# Patient Record
Sex: Male | Born: 1935
Health system: Southern US, Community
[De-identification: ages and names within clinical notes are randomized; demographics above are authoritative.]

## PROBLEM LIST (undated history)

## (undated) DIAGNOSIS — E11319 Type 2 diabetes mellitus with unspecified diabetic retinopathy without macular edema: Principal | ICD-10-CM

## (undated) DIAGNOSIS — I639 Cerebral infarction, unspecified: Secondary | ICD-10-CM

## (undated) DIAGNOSIS — H35039 Hypertensive retinopathy, unspecified eye: Secondary | ICD-10-CM

## (undated) DIAGNOSIS — N189 Chronic kidney disease, unspecified: Secondary | ICD-10-CM

## (undated) DIAGNOSIS — D239 Other benign neoplasm of skin, unspecified: Secondary | ICD-10-CM

## (undated) DIAGNOSIS — B029 Zoster without complications: Secondary | ICD-10-CM

## (undated) DIAGNOSIS — T8859XA Other complications of anesthesia, initial encounter: Secondary | ICD-10-CM

## (undated) DIAGNOSIS — C4491 Basal cell carcinoma of skin, unspecified: Secondary | ICD-10-CM

## (undated) DIAGNOSIS — D034 Melanoma in situ of scalp and neck: Secondary | ICD-10-CM

## (undated) DIAGNOSIS — I2510 Atherosclerotic heart disease of native coronary artery without angina pectoris: Secondary | ICD-10-CM

## (undated) DIAGNOSIS — B0229 Other postherpetic nervous system involvement: Secondary | ICD-10-CM

## (undated) DIAGNOSIS — I10 Essential (primary) hypertension: Secondary | ICD-10-CM

## (undated) DIAGNOSIS — I509 Heart failure, unspecified: Secondary | ICD-10-CM

## (undated) DIAGNOSIS — R1313 Dysphagia, pharyngeal phase: Secondary | ICD-10-CM

## (undated) DIAGNOSIS — M1990 Unspecified osteoarthritis, unspecified site: Secondary | ICD-10-CM

## (undated) DIAGNOSIS — D696 Thrombocytopenia, unspecified: Secondary | ICD-10-CM

## (undated) DIAGNOSIS — E785 Hyperlipidemia, unspecified: Secondary | ICD-10-CM

## (undated) DIAGNOSIS — T4145XA Adverse effect of unspecified anesthetic, initial encounter: Secondary | ICD-10-CM

## (undated) DIAGNOSIS — R55 Syncope and collapse: Secondary | ICD-10-CM

## (undated) DIAGNOSIS — H8110 Benign paroxysmal vertigo, unspecified ear: Secondary | ICD-10-CM

## (undated) DIAGNOSIS — Z85820 Personal history of malignant melanoma of skin: Secondary | ICD-10-CM

## (undated) DIAGNOSIS — N3000 Acute cystitis without hematuria: Secondary | ICD-10-CM

## (undated) DIAGNOSIS — C4492 Squamous cell carcinoma of skin, unspecified: Secondary | ICD-10-CM

## (undated) HISTORY — PX: FINGER SURGERY: SHX640

## (undated) HISTORY — PX: OTHER SURGICAL HISTORY: SHX169

## (undated) HISTORY — PX: CATARACT EXTRACTION: SUR2

## (undated) HISTORY — DX: Acute cystitis without hematuria: N30.00

## (undated) HISTORY — DX: Personal history of malignant melanoma of skin: Z85.820

## (undated) HISTORY — PX: FOOT SURGERY: SHX648

## (undated) HISTORY — DX: Hyperlipidemia, unspecified: E78.5

## (undated) HISTORY — PX: PENILE PROSTHESIS IMPLANT: SHX240

## (undated) HISTORY — DX: Atherosclerotic heart disease of native coronary artery without angina pectoris: I25.10

## (undated) HISTORY — DX: Essential (primary) hypertension: I10

## (undated) HISTORY — DX: Unspecified osteoarthritis, unspecified site: M19.90

## (undated) HISTORY — DX: Basal cell carcinoma of skin, unspecified: C44.91

## (undated) HISTORY — DX: Squamous cell carcinoma of skin, unspecified: C44.92

## (undated) HISTORY — DX: Type 2 diabetes mellitus with unspecified diabetic retinopathy without macular edema: E11.319

## (undated) HISTORY — DX: Other benign neoplasm of skin, unspecified: D23.9

## (undated) HISTORY — DX: Melanoma in situ of scalp and neck: D03.4

## (undated) HISTORY — DX: Cerebral infarction, unspecified: I63.9

## (undated) HISTORY — DX: Dysphagia, pharyngeal phase: R13.13

## (undated) HISTORY — DX: Chronic kidney disease, unspecified: N18.9

## (undated) HISTORY — DX: Other postherpetic nervous system involvement: B02.29

## (undated) HISTORY — DX: Hypertensive retinopathy, unspecified eye: H35.039

## (undated) HISTORY — PX: TONSILLECTOMY: SUR1361

---

## 1992-11-28 DIAGNOSIS — E11319 Type 2 diabetes mellitus with unspecified diabetic retinopathy without macular edema: Secondary | ICD-10-CM

## 1992-11-28 HISTORY — DX: Type 2 diabetes mellitus with unspecified diabetic retinopathy without macular edema: E11.319

## 1999-11-19 ENCOUNTER — Encounter: Payer: Self-pay | Admitting: Emergency Medicine

## 1999-11-19 ENCOUNTER — Inpatient Hospital Stay (HOSPITAL_COMMUNITY): Admission: EM | Admit: 1999-11-19 | Discharge: 1999-11-21 | Payer: Self-pay | Admitting: Emergency Medicine

## 1999-11-19 ENCOUNTER — Encounter: Payer: Self-pay | Admitting: Neurology

## 2001-02-07 ENCOUNTER — Ambulatory Visit (HOSPITAL_BASED_OUTPATIENT_CLINIC_OR_DEPARTMENT_OTHER): Admission: RE | Admit: 2001-02-07 | Discharge: 2001-02-07 | Payer: Self-pay | Admitting: Orthopedic Surgery

## 2001-02-07 ENCOUNTER — Encounter: Admission: RE | Admit: 2001-02-07 | Discharge: 2001-02-07 | Payer: Self-pay | Admitting: Orthopedic Surgery

## 2001-02-07 ENCOUNTER — Encounter: Payer: Self-pay | Admitting: Orthopedic Surgery

## 2003-08-02 ENCOUNTER — Emergency Department (HOSPITAL_COMMUNITY): Admission: AD | Admit: 2003-08-02 | Discharge: 2003-08-02 | Payer: Self-pay | Admitting: Emergency Medicine

## 2003-08-02 ENCOUNTER — Encounter: Payer: Self-pay | Admitting: Emergency Medicine

## 2004-12-03 ENCOUNTER — Inpatient Hospital Stay (HOSPITAL_COMMUNITY): Admission: RE | Admit: 2004-12-03 | Discharge: 2004-12-04 | Payer: Self-pay | Admitting: Urology

## 2010-04-27 HISTORY — PX: CARDIOVASCULAR STRESS TEST: SHX262

## 2010-04-28 HISTORY — PX: REPLACEMENT TOTAL KNEE: SUR1224

## 2010-05-24 ENCOUNTER — Inpatient Hospital Stay (HOSPITAL_COMMUNITY): Admission: RE | Admit: 2010-05-24 | Discharge: 2010-05-27 | Payer: Self-pay | Admitting: Orthopedic Surgery

## 2010-07-07 ENCOUNTER — Ambulatory Visit: Payer: Self-pay | Admitting: Cardiology

## 2010-09-29 ENCOUNTER — Ambulatory Visit (HOSPITAL_COMMUNITY): Admission: RE | Admit: 2010-09-29 | Discharge: 2010-09-29 | Payer: Self-pay | Admitting: Orthopedic Surgery

## 2010-09-29 ENCOUNTER — Ambulatory Visit: Payer: Self-pay | Admitting: Vascular Surgery

## 2010-10-18 ENCOUNTER — Ambulatory Visit (HOSPITAL_COMMUNITY)
Admission: RE | Admit: 2010-10-18 | Discharge: 2010-10-18 | Payer: Self-pay | Source: Home / Self Care | Admitting: Orthopedic Surgery

## 2010-11-17 ENCOUNTER — Ambulatory Visit: Payer: Self-pay | Admitting: Cardiology

## 2010-11-17 LAB — COMPREHENSIVE METABOLIC PANEL
CO2: 28 mmol/L
Creat: 1.72
Glucose: 89
Total Bilirubin: 0.6 mg/dL

## 2010-11-17 LAB — LIPID PANEL
Cholesterol, Total: 139
Direct LDL: 72
HDL: 37 mg/dL (ref 35–70)
Triglycerides: 152

## 2011-02-13 LAB — PROTIME-INR
INR: 0.98 (ref 0.00–1.49)
INR: 1.14 (ref 0.00–1.49)
INR: 1.99 — ABNORMAL HIGH (ref 0.00–1.49)
Prothrombin Time: 12.9 seconds (ref 11.6–15.2)
Prothrombin Time: 14.5 seconds (ref 11.6–15.2)
Prothrombin Time: 23.8 seconds — ABNORMAL HIGH (ref 11.6–15.2)

## 2011-02-13 LAB — GLUCOSE, CAPILLARY
Glucose-Capillary: 219 mg/dL — ABNORMAL HIGH (ref 70–99)
Glucose-Capillary: 237 mg/dL — ABNORMAL HIGH (ref 70–99)
Glucose-Capillary: 260 mg/dL — ABNORMAL HIGH (ref 70–99)
Glucose-Capillary: 277 mg/dL — ABNORMAL HIGH (ref 70–99)
Glucose-Capillary: 284 mg/dL — ABNORMAL HIGH (ref 70–99)
Glucose-Capillary: 298 mg/dL — ABNORMAL HIGH (ref 70–99)
Glucose-Capillary: 309 mg/dL — ABNORMAL HIGH (ref 70–99)
Glucose-Capillary: 342 mg/dL — ABNORMAL HIGH (ref 70–99)
Glucose-Capillary: 354 mg/dL — ABNORMAL HIGH (ref 70–99)

## 2011-02-13 LAB — BASIC METABOLIC PANEL
BUN: 14 mg/dL (ref 6–23)
BUN: 18 mg/dL (ref 6–23)
Calcium: 8.2 mg/dL — ABNORMAL LOW (ref 8.4–10.5)
Chloride: 101 mEq/L (ref 96–112)
Chloride: 101 mEq/L (ref 96–112)
Creatinine, Ser: 1.69 mg/dL — ABNORMAL HIGH (ref 0.4–1.5)
GFR calc Af Amer: 48 mL/min — ABNORMAL LOW (ref >60)
GFR calc non Af Amer: 38 mL/min — ABNORMAL LOW (ref >60)
Potassium: 4.6 mEq/L (ref 3.5–5.1)
Sodium: 137 mEq/L (ref 135–145)

## 2011-02-13 LAB — CBC
HCT: 33 % — ABNORMAL LOW (ref 39.0–52.0)
HCT: 34.9 % — ABNORMAL LOW (ref 39.0–52.0)
Hemoglobin: 11.4 g/dL — ABNORMAL LOW (ref 13.0–17.0)
Hemoglobin: 12.1 g/dL — ABNORMAL LOW (ref 13.0–17.0)
Hemoglobin: 14.8 g/dL (ref 13.0–17.0)
MCH: 32.5 pg (ref 26.0–34.0)
MCH: 32.5 pg (ref 26.0–34.0)
MCHC: 33.2 g/dL (ref 30.0–36.0)
MCHC: 34.6 g/dL (ref 30.0–36.0)
MCHC: 34.7 g/dL (ref 30.0–36.0)
MCV: 93.9 fL (ref 78.0–100.0)
MCV: 94.4 fL (ref 78.0–100.0)
MCV: 95.7 fL (ref 78.0–100.0)
Platelets: 126 10*3/uL — ABNORMAL LOW (ref 150–400)
RDW: 12.8 % (ref 11.5–15.5)
RDW: 12.9 % (ref 11.5–15.5)
RDW: 13 % (ref 11.5–15.5)
WBC: 9.1 10*3/uL (ref 4.0–10.5)

## 2011-02-13 LAB — COMPREHENSIVE METABOLIC PANEL
ALT: 16 U/L (ref 0–53)
BUN: 28 mg/dL — ABNORMAL HIGH (ref 6–23)
CO2: 26 mEq/L (ref 19–32)
Calcium: 9.3 mg/dL (ref 8.4–10.5)
Creatinine, Ser: 1.93 mg/dL — ABNORMAL HIGH (ref 0.4–1.5)
GFR calc non Af Amer: 34 mL/min — ABNORMAL LOW (ref >60)
Glucose, Bld: 509 mg/dL — ABNORMAL HIGH (ref 70–99)
Sodium: 133 mEq/L — ABNORMAL LOW (ref 135–145)
Total Protein: 6.9 g/dL (ref 6.0–8.3)

## 2011-02-13 LAB — SURGICAL PCR SCREEN: Staphylococcus aureus: NEGATIVE

## 2011-02-13 LAB — APTT: aPTT: 24 seconds (ref 24–37)

## 2011-02-13 LAB — URINALYSIS, ROUTINE W REFLEX MICROSCOPIC
Bilirubin Urine: NEGATIVE
Nitrite: NEGATIVE
Protein, ur: NEGATIVE mg/dL
Specific Gravity, Urine: 1.03 (ref 1.005–1.030)
Urobilinogen, UA: 0.2 mg/dL (ref 0.0–1.0)

## 2011-02-13 LAB — TYPE AND SCREEN
ABO/RH(D): B POS
Antibody Screen: NEGATIVE

## 2011-04-15 NOTE — Op Note (Signed)
[place]. [facility]  Patient:    CURRY, SEEFELDT                      MRN: [id number] Proc. Date: 02/07/01 Adm. Date:  [id number] Attending:  Lara Mulch                           Operative Report  PREOPERATIVE DIAGNOSIS:  Left navicular fracture/dislocation.  POSTOPERATIVE DIAGNOSIS:  Left navicular fracture/dislocation.  PROCEDURE:  Left navicular open reduction/internal fixation.  ANESTHESIA:  General.  SURGEON:  Estill Bamberg. Ronnie Derby, M.D.  ASSISTANT:  None.  INDICATION FOR PROCEDURE:  Patient is a 77 year old white male, who fell yesterday morning and x-ray showed a fracture/dislocation of the navicular. After informed consent was obtained, he was taken to the operating room.  DESCRIPTION OF PROCEDURE:  Patient was laid supine, given general endotracheal intubation and the left lower extremity was prepped and draped in the usual sterile fashion.  The extremity was exsanguinated and the tourniquet was elevated to 250 mmHg.  A straight incision was made approximately 4 cm in length with a #10-blade directly over the navicular.  Sharp dissection was continued down to the bone.  It had a split with a superior and inferior piece.  There was comminution on the lateral border.  I used the bone reduction forcep under direct C-arm imaging to obtain a reduction after cleaning out the fracture site.  I then placed a 4.0 cannulated screw from inferolateral to superomedially and after placing the 26 mm screw, the reduction clamp was removed and it was obtained and good alignment was obtained and it was completely located without any subluxation at all.  The wound was then irrigated and closed with deep 0 Vicryl interrupted sutures, subcuticular 3-0 Vicryls and Steri-Strips.  Adaptic, 4 x 4s, and a posterior splint was used for a dressing.  Tourniquet time 40 minutes.  Complications none.  Drains none. DD:  02/07/01 TD:  02/07/01 Job: [id number]

## 2011-04-15 NOTE — H&P (Signed)
Frank, Simpson               ACCOUNT NO.:  [id number]   MEDICAL RECORD NO.:  [id number]          PATIENT TYPE:  INP   LOCATION:  1308                         FACILITY:  [facility]   PHYSICIAN:  Duane Lope. Parks Neptune, M.D.DATE OF BIRTH:  1931/03/11   DATE OF ADMISSION:  12/03/2004  DATE OF DISCHARGE:  12/04/2004                                HISTORY & PHYSICAL   CHIEF COMPLAINT:  Erectile dysfunction.   HISTORY OF PRESENT ILLNESS:  Frank Simpson is a pleasant 77 year old male with a  past medical history significant for diabetes mellitus, as well as Perone's  disease with resulting impotence.  The patient also has some bladder  obstructive symptoms which are stable on Flomax.  The patient is also known  to have benign prostatic hypertrophy.  Regarding his Perone's disease and  organic impotence, he was recently evaluated in the clinic.  He has tried  vitamin E in the past for treatment of his Perone's disease without a good  result.  He is not interested in intra-lesional injections.  According to  the patient, his erectile dysfunction has prevented him from having an  erection adequate for penetration for some time.  He is therefore interested  in the surgical intervention for the treatment of his erectile dysfunction.  He is admitted at this time to undergo placement of a three-piece inflatable  penile prosthesis.   PAST MEDICAL HISTORY:  1.  Perone's disease.  2.  Organic impotence.  3.  Non-insulin dependent diabetes mellitus.  4.  Hypertension.  5.  Hypercholesterolemia.   PAST SURGICAL HISTORY:  1.  Surgery on the right foot.  2.  Repair of right digit.   FAMILY HISTORY:  Positive for diabetes mellitus and hypertension.  There is  no family history of genitourinary malignancy.   SOCIAL HISTORY:  The patient denies tobacco, alcohol, or intravenous drug  use.   REVIEW OF SYSTEMS:  Positive for diabetes mellitus, headaches, muscle aches,  and nocturia.  The remainder of the  review of systems have been evaluated  and are negative.   MEDICATIONS:  The patient takes medications for diabetes mellitus,  hypertension, as well as hypercholesterolemia.   ALLERGIES:  No known drug allergies.   PHYSICAL EXAMINATION:  VITAL SIGNS:  Temperature 98.2, heart rate 68, blood  pressure 142/60, weight 208 pounds.  GENERAL:  A well-developed, well-nourished, in no acute distress.  HEENT:  Normocephalic, atraumatic.  Pupils equal, round, reactive to light  and accommodation.  Extraocular movements were intact.  The oropharynx is  clear.  NECK:  Supple, no lymphadenopathy.  There is no JVD.  CARDIOVASCULAR:  Regular rate and rhythm, no murmurs, rubs, or gallops.  PULMONARY:  Clear to auscultation bilaterally.  ABDOMEN:  Soft, nontender, nondistended, positive bowel sounds.  GENITOURINARY:  Normal-appearing circumcised male phallus.  There are  palpable Perone's plaques especially in the distal most portion of the left  corpora.  The testicles are bilaterally descended, and there appears to be a  small hydrocele overlying the right testicle.  Both epididymis are palpable  and nontender.  RECTAL:  Digital rectal examination deferred.  EXTREMITIES:  No cyanosis, clubbing, or edema.  NEUROLOGIC:  Cranial nerves II-XII intact.  Motor is 5/5 throughout.  Sensation is grossly nonfocal.   LABORATORY DATA:  The patient has a creatinine of 1.7.   ADDITIONAL STUDIES:  The patient had a normal Cardiolite performed on  December 22, 2003.  There was no evidence of inducible ischemia.  He has an  ejection fraction of 60%.   IMPRESSION AND PLAN:  Frank Simpson is a 77 year old male with diabetes mellitus  (non-insulin dependent) who suffers from erectile dysfunction.  He has known  Perone's disease also with an element of organic impotence.  He is admitted  at this time to undergo placement of a three-piece inflatable penile  prosthesis.  He has been cleared by his cardiologist for  surgery.   PLAN:  1.  Consent for IPP.  2.  He will be taken to the operating room to undergo placement of an AMS700      penile prosthesis coated with _________________.      EG/MEDQ  D:  12/05/2004  T:  12/05/2004  Job:  [id number]

## 2011-04-15 NOTE — H&P (Signed)
[place]. [facility]  Patient:    Frank Simpson                       MRN: [id number] Adm. Date:  [id number] Attending:  Vickki Muff, Richard Mark Iv Dictator:   Izora Ribas. Irene Pap, M.D.                         History and Physical  CHIEF COMPLAINT:  Dizziness, incoordination.  HISTORY OF PRESENT ILLNESS:  The patient is a 77 year old man who woke up early  this morning around 7:30 in the morning with a severe dizziness/vertigo, associated with difficulty walking, as he refers, he was walking like drunk.  The symptoms  were associated with numbness on the right side of his face, right arm, and some in the right lower extremity.  The patients wife thinks he may of had some slurred  speech as well.  He also has had facial and headache pain on the right side, throbbing in character, which is resolving at this time.  There is no history of double vision, no nausea, no vomiting.  No specific weakness of upper or lower extremities.  No prior history of strokes or TIAs.  PAST MEDICAL HISTORY: 1. Hypertension. 2. Diabetes mellitus.  CURRENT MEDICATIONS: 1. One anti-hypertensive. 2. One hypoglycemia oral agents, names which he cannot recall at this time.  ALLERGIES:  No known drug allergies.  SOCIAL HISTORY:  Lives with his wife.  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  Denies smoking, no drinking.  FAMILY HISTORY:  His mother for a stroke and intracranial hemorrhage.  PRIMARY CARE PHYSICIAN:  Dr. Darlin Coco.  REVIEW OF SYSTEMS:  As per history of present illness.  Denies chest pain, no shortness of breath, no abdominal pain, no fever, no seizures, no loss of consciousness.  PHYSICAL EXAMINATION:  VITAL SIGNS:  Blood pressure 166/99, pulse 66, respirations 14, temperature 97.4, oxygen saturation 97%.  HEENT:  Head is normocephalic, atraumatic, no meningeal signs.  Eyes, ears, nose, and throat are normal.  NECK:  Supple, no  bruits.  LUNGS:  Clear.  HEART:  Bilateral heart sounds are regular rhythm with no murmurs.  ABDOMEN:  Soft, bowel sounds present, no hepatosplenomegaly, no tenderness.  EXTREMITIES:  Positive pulses bilaterally.  NEUROLOGIC:  Awake, alert, oriented.  Speech is slightly dysarthric.  His memory and language abilities are appropriate for age.  Pupils are equal and reactive.  Bilateral extraocular cephalic movement intact.  Nose intact.  Face symmetric.  Tongue in midline.  Palate elevated in midline.  Motor examination and strength  equal bilaterally, 5/5 throughout, plantars downgoing.  Coordination: Finger-to-nose and heel-to-shin are normal.  Gait was not evaluated at this time. Sensation was intact with touch and pinprick.  LABORATORY DATA:  Neuro imaging:  I personally reviewed the CT scan of the patients brain which shows no acute ischemic changes, no hemorrhage, no hydrocephalus.  IMPRESSION: 1. Vertebral basilar insufficiency. 2. Hypertension. 3. Diabetes mellitus.  Plan, recommendations, the diagnosis, condition, and further interventions were  discussed at length with the patient and his wife at the bedside.  We will admit the patient to the hospital for further workup and testing for cerebrovascular disease.  Initial management will consist of hemodynamics with supported with intravenous fluids of normal saline 100 cc an hour, oxygen 2 L, nd heparin ischemic stroke protocol.  We will further obtain neuro imaging for noninvasive evaluation of  the intra and extracranial vessels with an MRI/MRA. Also, carotid ultrasounds and transcranial Doppler will be obtained. Cardiology evaluation will be requested with a 2-D echocardiogram to evaluate for potential embolic sources as a cause of a stroke.  Will ask physical therapy to evaluate he patient for ambulation and gait safety.  Dr. Mare Ferrari, the primary care physician, as notified for his patients admission. DD:   11/19/99 TD:  11/19/99 Job: [id number]

## 2011-04-15 NOTE — Discharge Summary (Signed)
NAMEMARSHAL, ESKEW               ACCOUNT NO.:  [id number]   MEDICAL RECORD NO.:  [id number]          PATIENT TYPE:  INP   LOCATION:  8182                         FACILITY:  [facility]   PHYSICIAN:  Duane Lope. Rosana Hoes, M.D.  DATE OF BIRTH:  06-01-1931   DATE OF ADMISSION:  12/03/2004  DATE OF DISCHARGE:  12/04/2004                                 DISCHARGE SUMMARY   ADMISSION DIAGNOSES:  Erectile dysfunction and Peyronie's disease.   DISCHARGE DIAGNOSIS:  Status post insertion of inflatable penile prosthesis.   BRIEF HISTORY:  Mr. Warf is a pleasant 77 year old white male with a  history significant for diabetes as well as Peyronie's disease with  resulting impotence.  He also had some bladder obstructive symptoms that are  stable on Flomax.  He also has BPH.  He has previously tried vitamin E for  treatment of his Peyronie's disease without a good result, and he has been  unable to have an erection for penetration in quite some time.  After  discussion with Dr. Rosana Hoes about implantable penile devices, he has elected  to proceed with this surgery.   PAST MEDICAL HISTORY:  1.  Peyronie's disease.  2.  Organic impotence.  3.  Noninsulin-dependent diabetes mellitus.  4.  Hypertension.  5.  Hypercholesterolemia.   PAST SURGICAL HISTORY:  1.  Surgery on the right foot.  2.  Repair of right digit.   ALLERGIES:  None known.   HOSPITAL COURSE:  The patient was admitted on December 03, 2004.  Mr. Dunne  was electively admitted to [facility] under the care of Dr. Tresa Endo, and he underwent the following procedure:  Insertion of inflatable  penile prosthesis.  He tolerated the procedure well, transferred in stable  condition to the PACU.  Mr. Zweber remained hospitalized overnight with  minimal swelling, some bruising noted, and voiding well.  He was discharged  on December 04, 2004.   CONDITION ON DISCHARGE:  Stable.   DISCHARGE MEDICATIONS:  1.  Cipro 500 mg p.o. b.i.d.  2.   Vicodin 1-2 q.4-6h.  3.  Glipizide ER 100 mg 1 daily.  4.  Flomax 0.4 mg 1 daily.  5.  Norvasc 5 mg 1 daily.  6.  Metformin 500 mg b.i.d.  7.  Triamterene and hydrochlorothiazide 37.5/25 mg 1 daily.  8.  Lipitor 10 mg 1 daily.   ACTIVITY:  He may gradually increase and may shower on Sunday.   No diet restrictions.   WOUND CARE:  Keep clean.   SPECIAL INSTRUCTIONS:  Call if temperature greater than 101 F or as needed.  Follow up following Thursday or Friday to see Alvin Critchley, NP-C.      JML/MEDQ  D:  12/23/2004  T:  12/23/2004  Job:  [id number]

## 2011-04-15 NOTE — Op Note (Signed)
Frank Simpson, Frank Simpson               ACCOUNT NO.:  [id number]   MEDICAL RECORD NO.:  [id number]          PATIENT TYPE:  INP   LOCATION:  1962                         FACILITY:  [facility]   PHYSICIAN:  Duane Lope. Rosana Hoes, M.D.  DATE OF BIRTH:  02-Apr-1931   DATE OF PROCEDURE:  12/03/2004  DATE OF DISCHARGE:  12/04/2004                                 OPERATIVE REPORT   PREOPERATIVE DIAGNOSIS:  Organic impotence.   POSTOPERATIVE DIAGNOSIS:  Organic impotence.   PROCEDURE PERFORMED:  Insertion of three-piece inflatable penile prosthesis  (AMS700).   ATTENDING SURGEON:  Duane Lope. Rosana Hoes, M.D.   RESIDENT SURGEON:  Lynford Citizen, M.D.   ANESTHESIA:  General endotracheal.   ESTIMATED BLOOD LOSS:  50 cc.   DRAINS:  A 16-French Foley catheter to straight drain.   COMPLICATIONS:  None.   INDICATION FOR PROCEDURE:  Frank Simpson is a pleasant 77 year old male with a  past medical history significant for diabetes mellitus.  He also has  Peyronie's disease with resulting impotence.  In regards to his impotence,  he has tried vitamin E in the past which has not helped.  He has not been  interested in intralesional injection of his Peyronie's plaque.  Several  alternatives were discussed to manage his erectile dysfunction.  Frank Simpson  is willing to proceed with the insertion of a three-piece inflatable penile  prosthesis.  In addition, he may well need plaque excision at the time of  surgery.  He understands all of the risks, benefits, and alternatives of the  procedure and is willing to proceed.   PROCEDURE IN DETAIL:  Following identification by his arm bracelet, the  patient was brought to the operating room and placed in the supine position.  Here he received preoperative IV antibiotics and underwent successful  induction of general endotracheal anesthesia.  His lower abdomen, perineum,  and genitalia were then given a 10-minute iodine scrub.  This was followed  by prep with Betadine paint and the  phallus was draped in the usual sterile  fashion.  An approximate 5-cm vertical incision was then created from the  base of the penis towards the umbilicus.  The incision was carried through  the subcutaneous tissue using Bovie electrocautery.  The rectus fascia was  encountered and entered using the Bovie.  The surgeon's finger was then used  to create a pocket beneath the rectus muscle adequate for placement of the  reservoir.  Of mention, prior to incision, a 16-French Foley catheter was  placed and the bladder was drained in its entirety.  We then used Metzenbaum  scissors to remove the overlying tissue from the corpora at the base of the  penis.  Care was taken to avoid the urethra which was easily palpable with a  Foley catheter in place.  Near the base of both corpora, we then placed  interrupted 3-0 PDS holding sutures, both on the right and left corpora.  We  then used 15-blade scalpel to incise the corpora in between the holding  sutures.  Metzenbaum scissors were then used to further develop the  corporotomies.  Corporotomies were extended distally and proximally to allow  adequate dilation and placement of the cylinders.  We then dilated both  corpora both distally and proximally using the [facility] dilators.  We dilated  up to 14-French without difficulty.  There was palpable plaque in the left  corpora which broke fairly easily with dilation using the [facility] dilators.  Once dilation was adequate, we then placed a 13-French Hagar dilator which  passed easily into the tip of the glans distally on both the right and the  left.  In addition proximally the Hagar dilator was found to pass to the  base of both corpora until bone was encountered.  We then irrigated the  incision with antibiotic solution.  Two sterile green towels were placed on  the already sterile field.  The inflatable device was then prepared for  insertion.  Using the [facility] tool, the cylinder holding sutures were  passed  through the lateral aspect of the glans on both the right and the left using  the [place] needle.  Prior to this, we performed measurement of both corpora  which measured approximately 18 on both the right and the left.  We  therefore decided to place a 15-cm prosthetic device with 3-cm rear tips to  achieve 18 cm bilaterally.  The corporal cylinders were then advanced  distally on both the right and the left.  With 3-cm rear tips, the  prosthetic cylinders fit well without any buckling or excess cylinder from  the corporotomy incisions.  Holding gentle pressure at the corporotomy  sites, we then cycled the device to ensure an adequate result.  The result  was excellent with a straight erection.  We then performed some molding of  the device to provide a straight penis with an excellent result.  The distal  aspects of both corporal cylinders were in their proper locations.  We then  closed both corporotomies over the device carefully using a running 3-0 PDS.  In addition, the previously described traction sutures were used to  reinforce the closing of the corporotomies.  We then everted the right  hemiscrotum into the incision, care taken to keep the testicle from this  portion of the procedure.  Using a dry Ray-Tec, the overlying adherent  tissue was moved to provide a nice pocket for placement of the pump in the  most dependent portion of the right hemiscrotum.  The pump was then placed  into this pocket with an excellent result.  It was quite easy to access and  was quite dependent in the right hemiscrotum.  We then placed the reservoir  within the previously formed space beneath the right rectus muscle adjacent  to the bladder.  Approximately 65 cc were then placed into the reservoir and  there was no back pressure on the reservoir.  Five cc's were removed from  the reservoir and 60 cc left as the tubing was clamped with a rubber shot. We then trimmed all excess tubing and  connected the pump to the reservoir  using a straight connector.  Great care was taken to ensure no air bubbles  were in the tubing.  The device was then again cycled and found to function  properly with both a straight erection and rigidity sufficient for  intercourse.  The incision with all tubing and components was then again  copiously irrigated with antibiotic solution.  The tubing was then closed  within the deep subcutaneous tissue using a running 3-0 chromic suture.  Prior  to this, the rectus fascia was closed around the reservoir tubing with  a running 3-0 PDS suture.  The skin was then closed with surgical clips.  The Foley catheter was connected to straight drainage.  Prior to awakening  from anesthesia, an athletic supporter with Kerlix fluff was applied to  complete the dressing.  A sterile dressing was applied over the staple line.  The patient tolerated the procedure well and there were no complications.  Please note that Dr. Tresa Endo was present and participated in the entire  procedure as he was the responsible surgeon.   DISPOSITION:  After awakening from general anesthesia, the patient was  transferred to the postanesthesia care unit in stable condition.  From here,  he would be transferred to the floor for overnight observation and removal  of his catheter the following morning.      EG/MEDQ  D:  12/05/2004  T:  12/06/2004  Job:  [id number]

## 2011-05-02 ENCOUNTER — Other Ambulatory Visit: Payer: Self-pay | Admitting: *Deleted

## 2011-05-02 MED ORDER — ROSUVASTATIN CALCIUM 10 MG PO TABS: 10.0000 mg | ORAL_TABLET | Freq: Every day | ORAL | Status: DC

## 2011-05-02 NOTE — Telephone Encounter (Signed)
escribe medication per fax request

## 2011-05-30 ENCOUNTER — Other Ambulatory Visit: Payer: Self-pay | Admitting: *Deleted

## 2011-05-30 DIAGNOSIS — I119 Hypertensive heart disease without heart failure: Secondary | ICD-10-CM

## 2011-05-30 MED ORDER — TRIAMTERENE-HCTZ 37.5-25 MG PO TABS: 1.0000 | ORAL_TABLET | Freq: Every day | ORAL | Status: DC

## 2011-05-30 MED ORDER — AMLODIPINE BESYLATE 5 MG PO TABS: 5.0000 mg | ORAL_TABLET | Freq: Every day | ORAL | Status: DC

## 2011-05-30 NOTE — Telephone Encounter (Signed)
Refilled meds per fax request.

## 2011-07-06 ENCOUNTER — Encounter: Payer: Self-pay | Admitting: Cardiology

## 2011-07-15 ENCOUNTER — Other Ambulatory Visit: Payer: Self-pay | Admitting: Cardiology

## 2011-07-15 DIAGNOSIS — E785 Hyperlipidemia, unspecified: Secondary | ICD-10-CM

## 2011-07-15 DIAGNOSIS — I10 Essential (primary) hypertension: Secondary | ICD-10-CM

## 2011-07-18 ENCOUNTER — Encounter: Payer: Self-pay | Admitting: Cardiology

## 2011-07-18 ENCOUNTER — Other Ambulatory Visit (INDEPENDENT_AMBULATORY_CARE_PROVIDER_SITE_OTHER): Payer: Medicare Other | Admitting: *Deleted

## 2011-07-18 ENCOUNTER — Ambulatory Visit (INDEPENDENT_AMBULATORY_CARE_PROVIDER_SITE_OTHER): Payer: Medicare Other | Admitting: Cardiology

## 2011-07-18 DIAGNOSIS — M1990 Unspecified osteoarthritis, unspecified site: Secondary | ICD-10-CM

## 2011-07-18 DIAGNOSIS — E1140 Type 2 diabetes mellitus with diabetic neuropathy, unspecified: Secondary | ICD-10-CM | POA: Insufficient documentation

## 2011-07-18 DIAGNOSIS — E119 Type 2 diabetes mellitus without complications: Secondary | ICD-10-CM

## 2011-07-18 DIAGNOSIS — E7800 Pure hypercholesterolemia, unspecified: Secondary | ICD-10-CM

## 2011-07-18 DIAGNOSIS — I10 Essential (primary) hypertension: Secondary | ICD-10-CM

## 2011-07-18 DIAGNOSIS — E785 Hyperlipidemia, unspecified: Secondary | ICD-10-CM

## 2011-07-18 DIAGNOSIS — I119 Hypertensive heart disease without heart failure: Secondary | ICD-10-CM

## 2011-07-18 LAB — HEMOGLOBIN A1C: Hgb A1c MFr Bld: 8.8 % — ABNORMAL HIGH (ref 4.6–6.5)

## 2011-07-18 LAB — HEPATIC FUNCTION PANEL
Albumin: 4 g/dL (ref 3.5–5.2)
Alkaline Phosphatase: 43 U/L (ref 39–117)
Total Bilirubin: 1 mg/dL (ref 0.3–1.2)

## 2011-07-18 LAB — CBC WITH DIFFERENTIAL/PLATELET
Eosinophils Relative: 1 % (ref 0.0–5.0)
HCT: 50.1 % (ref 39.0–52.0)
Hemoglobin: 16.8 g/dL (ref 13.0–17.0)
Lymphs Abs: 3 10*3/uL (ref 0.7–4.0)
MCV: 94 fl (ref 78.0–100.0)
Monocytes Relative: 8 % (ref 3.0–12.0)
Neutro Abs: 3.9 10*3/uL (ref 1.4–7.7)
RDW: 13.3 % (ref 11.5–14.6)
WBC: 7.6 10*3/uL (ref 4.5–10.5)

## 2011-07-18 LAB — BASIC METABOLIC PANEL
Calcium: 9.3 mg/dL (ref 8.4–10.5)
GFR: 33.68 mL/min — ABNORMAL LOW (ref >60.00)
Glucose, Bld: 184 mg/dL — ABNORMAL HIGH (ref 70–99)
Sodium: 140 mEq/L (ref 135–145)

## 2011-07-18 LAB — LIPID PANEL
HDL: 40.1 mg/dL (ref >39.00)
LDL Cholesterol: 80 mg/dL (ref 0–99)
Total CHOL/HDL Ratio: 4
VLDL: 39.4 mg/dL (ref 0.0–40.0)

## 2011-07-18 NOTE — Progress Notes (Signed)
Ashby Jackquline Berlin Date of Birth:  1931/02/18 [facility] [address]   [place], [place]  [address] [phone]           Fax   [phone]  History of Present Illness: This pleasant 77 year old gentleman is seen for a scheduled followup office visit.  He has a past history of hypercholesterolemia, essential hypertension, and diabetes mellitus.  He also has a history of osteoarthritis.  He has had previous right total knee replacement but has continued to have some discomfort in the right knee and now is having some pain in his right foot.  Because of this he has not been able to do much walking and he has gained weight.  He denies any chest pain or shortness of breath he does not have any history of ischemic heart disease and he had a normal nuclear stress test 04/27/10.  Current Outpatient Prescriptions  Medication Sig Dispense Refill  . amLODipine (NORVASC) 5 MG tablet Take 1 tablet (5 mg total) by mouth daily.  90 tablet  3  . aspirin 325 MG tablet Take 325 mg by mouth daily. OCCASSIONALLY       . ezetimibe (ZETIA) 10 MG tablet Take 10 mg by mouth daily.        [name] Kitchen glipiZIDE (GLUCOTROL) 10 MG tablet Take 10 mg by mouth daily.        . insulin glargine (LANTUS) 100 UNIT/ML injection Inject 46 Units into the skin at bedtime.        . niacin (NIASPAN) 500 MG CR tablet Take 500 mg by mouth at bedtime. OCCASSIONALLY       . rosuvastatin (CRESTOR) 10 MG tablet Take 1 tablet (10 mg total) by mouth at bedtime.  90 tablet  3  . Solifenacin Succinate (VESICARE PO) Take by mouth daily.        . Tamsulosin HCl (FLOMAX) 0.4 MG CAPS Take 0.4 mg by mouth daily.        [name] Kitchen triamterene-hydrochlorothiazide (MAXZIDE-25) 37.5-25 MG per tablet Take 1 tablet by mouth daily.  90 tablet  3    Allergies  Allergen Reactions  . Januvia (Sitagliptin Phosphate)   . Metformin And Related     There is no problem list on file for this patient.   History  Smoking status  .  Never Smoker   Smokeless tobacco  . Not on file    History  Alcohol Use No    Family History  Problem Relation Age of Onset  . Stroke Mother   . Cancer Father     Review of Systems: Constitutional: no fever chills diaphoresis or fatigue or change in weight.  Head and neck: no hearing loss, no epistaxis, no photophobia or visual disturbance. Respiratory: No cough, shortness of breath or wheezing. Cardiovascular: No chest pain peripheral edema, palpitations. Gastrointestinal: No abdominal distention, no abdominal pain, no change in bowel habits hematochezia or melena. Genitourinary: No dysuria, no frequency, no urgency, no nocturia. Musculoskeletal:No arthralgias, no back pain, no gait disturbance or myalgias. Neurological: No dizziness, no headaches, no numbness, no seizures, no syncope, no weakness, no tremors. Hematologic: No lymphadenopathy, no easy bruising. Psychiatric: No confusion, no hallucinations, no sleep disturbance.    Physical Exam: Filed Vitals:   07/18/11 0937  BP: 130/80  Pulse: 76  The general appearance reveals a well-developed well-nourished gentleman in no distress.The head and neck exam reveals pupils equal and reactive.  Extraocular movements are full.  There is no scleral icterus.  The mouth and pharynx are normal.  The neck is supple.  The carotids reveal no bruits.  The jugular venous pressure is normal.  The  thyroid is not enlarged.  There is no lymphadenopathy.  The chest is clear to percussion and auscultation.  There are no rales or rhonchi.  Expansion of the chest is symmetrical.  The precordium is quiet.  The first heart sound is normal.  The second heart sound is physiologically split.  There is no murmur gallop rub or click.  There is no abnormal lift or heave.  The abdomen is soft and nontender.  The bowel sounds are normal.  The liver and spleen are not enlarged.  There are no abdominal masses.  There are no abdominal bruits.  Extremities reveal  good pedal pulses.  There is no phlebitis or edema.  There is no cyanosis or clubbing.  Strength is normal and symmetrical in all extremities.  There is no lateralizing weakness.  There are no sensory deficits.  The skin is warm and dry.  There is no rash.     Assessment / Plan: The patient is to increase his Lantus insulin to 50 units each night.  Continue other medicines the same.  Recheck in 6 months for followup office visit and lab work.  I encouraged him to go back to his orthopedist to discuss his right foot pain and see if anything could be done about it.

## 2011-07-18 NOTE — Assessment & Plan Note (Signed)
The patient has a past history of hypercholesterolemia.  He is on Zetia Niaspan and Crestor.  He's not having any myalgias from the statin therapy.  He will continue present regimen.

## 2011-07-18 NOTE — Assessment & Plan Note (Signed)
The patient is not having any headaches or dizziness.  No shortness of breath with minimal activity.  His weight is up 13 pounds since last visit which she attributes to inactivity because of foot pain.

## 2011-07-18 NOTE — Assessment & Plan Note (Addendum)
The patient is on insulin and glipizide.  He is not having any hypoglycemic reactions.  We're checking lab work on him today.His hemoglobin A1c today is high at 8.8 And we will have him increase his Lantus insulin to 50 units each night instead of 46 minutes.  He also needs to try to increase exercise and to cut back on calories

## 2011-07-19 ENCOUNTER — Other Ambulatory Visit: Payer: Self-pay | Admitting: *Deleted

## 2011-07-19 DIAGNOSIS — E119 Type 2 diabetes mellitus without complications: Secondary | ICD-10-CM

## 2011-07-19 MED ORDER — GLIPIZIDE 10 MG PO TABS: 10.0000 mg | ORAL_TABLET | Freq: Every day | ORAL | Status: DC

## 2011-07-19 NOTE — Telephone Encounter (Signed)
Refilled meds per fax request.

## 2011-07-20 ENCOUNTER — Telehealth: Payer: Self-pay | Admitting: *Deleted

## 2011-07-20 NOTE — Telephone Encounter (Signed)
Advised patient of labs and to increase Lantus to 50 units

## 2011-07-20 NOTE — Telephone Encounter (Signed)
Message copied by Deniece Ree on Wed Jul 20, 2011 10:10 AM ------      Message from: Darlin Coco      Created: Mon Jul 18, 2011  5:04 PM       Please report.  His hemoglobin A1c is too high at 8.8.  I wanted him to increase his Lantus insulin from 46 units up to 50 units each night.  His blood sugar is 184 and he is renal function is not as good.  His BUN is 30 and his creatinine is 2.0.  He needs to be sure to drink more water to prevent his kidneys from getting dry.  The liver tests are good.

## 2011-08-04 ENCOUNTER — Telehealth: Payer: Self-pay | Admitting: Cardiology

## 2011-08-04 NOTE — Telephone Encounter (Addendum)
Pt's wife calling for refill of humalog 43m, uses [facility] [address], pt's wife called back and does not need the med after all

## 2011-08-04 NOTE — Telephone Encounter (Signed)
Noted

## 2011-08-05 ENCOUNTER — Telehealth: Payer: Self-pay | Admitting: Cardiology

## 2011-08-05 NOTE — Telephone Encounter (Signed)
Wife called very concerned about blood sugar.  Yesterday blood sugar over 400 and today over 600.  Patient is going to call [place] [facility] to see if someone can see him there, if not advised to take him to urgent care.  Advised patient needs to be evaluated today.  Verbalized understanding and will get him evaluated today per wife

## 2011-08-06 NOTE — Telephone Encounter (Signed)
Agree with plan

## 2011-08-10 ENCOUNTER — Encounter: Payer: Self-pay | Admitting: Family Medicine

## 2011-08-11 ENCOUNTER — Encounter: Payer: Self-pay | Admitting: Family Medicine

## 2011-08-11 ENCOUNTER — Ambulatory Visit (INDEPENDENT_AMBULATORY_CARE_PROVIDER_SITE_OTHER): Payer: Medicare Other | Admitting: Family Medicine

## 2011-08-11 DIAGNOSIS — E1122 Type 2 diabetes mellitus with diabetic chronic kidney disease: Secondary | ICD-10-CM | POA: Insufficient documentation

## 2011-08-11 DIAGNOSIS — R32 Unspecified urinary incontinence: Secondary | ICD-10-CM

## 2011-08-11 DIAGNOSIS — E119 Type 2 diabetes mellitus without complications: Secondary | ICD-10-CM

## 2011-08-11 DIAGNOSIS — N189 Chronic kidney disease, unspecified: Secondary | ICD-10-CM

## 2011-08-11 DIAGNOSIS — N3941 Urge incontinence: Secondary | ICD-10-CM | POA: Insufficient documentation

## 2011-08-11 DIAGNOSIS — M1990 Unspecified osteoarthritis, unspecified site: Secondary | ICD-10-CM

## 2011-08-11 DIAGNOSIS — I10 Essential (primary) hypertension: Secondary | ICD-10-CM

## 2011-08-11 DIAGNOSIS — E7800 Pure hypercholesterolemia, unspecified: Secondary | ICD-10-CM

## 2011-08-11 MED ORDER — "INSULIN SYRINGE 29G X 1/2"" 0.5 ML MISC": Status: DC

## 2011-08-11 MED ORDER — INSULIN LISPRO 100 UNIT/ML ~~LOC~~ SOLN: SUBCUTANEOUS | Status: DC

## 2011-08-11 NOTE — Assessment & Plan Note (Signed)
Continue meds.  Recheck when returns fasting for blood work. Off zetia and niaspan.  Continues crestor.

## 2011-08-11 NOTE — Assessment & Plan Note (Signed)
Lab Results  Component Value Date   CREATININE 2.0* 07/18/2011  baseline Cr possibly 1.7. Slightly elevated last check. Discussed avoidance of NSAIDs.

## 2011-08-11 NOTE — Assessment & Plan Note (Addendum)
Chronic uncontrolled.  On lantus and glipizide. Not optimal control as evidenced by recent cbgs (brings log) Will slowly titrate lantus up to 60units.  Return 1 mo for f/u. Not candidate for metformin or januvia. Foot exam today, recommended make eye doctor appoint.

## 2011-08-11 NOTE — Assessment & Plan Note (Signed)
Recommended stop NSAID, transfer to tylenol.

## 2011-08-11 NOTE — Patient Instructions (Addendum)
Kidneys not looking so great, I would avoid anti inflammatories like ibuprofen, alleve, advil, motrin. Start aspirin 48m daily. May use tylenol 50110mthree times daily, may increase to 100049mhree times daily if needed (but this would be max dose). Return at your convenience fasting for blood work. Make appointment for vision screen. urine checked today. If average of 3 morning sugars consistently >160, increase lantus by 2 units to goal of 60units. Return in 1 month for follow up of diabetes.

## 2011-08-11 NOTE — Assessment & Plan Note (Signed)
Stable, chronic.  continue current meds. May consider change to ACEI when returns for next visit.

## 2011-08-11 NOTE — Progress Notes (Signed)
Subjective:    Patient ID: Frank Simpson, male    DOB: Mar 25, 1931, 77 y.o.   MRN: [id number]  HPI CC: new pt, establish.  Medicare  Mr. Aloisi presents today to establish.  Hasn't had PCP in past, seen by Dr. Mare Ferrari.  H/o urinary incontinence - previously saw urologist, on flomax nightly as well as vesicare prn.  Has undergone PTNS.  DM - 1994 dx.  Uncontrolled.  On lantus and ssi humalog as well as glipizide.  Sugars have been running high in past week. Brings log of sugars showing fasting in last 4 days 167-318, mealtime 217-242.  Last vision check 1 year ago.  No recent foot exam.  Intolerant of metformin 2/2 CRI and januvia (per chart allergy but pt unsure). Lab Results  Component Value Date   HGBA1C 8.8* 07/18/2011   HTN - well controlled on current meds.  Only on norvasc, maxzide and flomax.    CRI - unsure baseline cr.  Does use NSAIDs for arhtritis.  Doesn't think tylenol works as well.  Fall last week, bruised shoulder. H/o skin cancer (melanoma) in past x2.  First in stomach then in eye.  Also h/o AKs.  Preventative: Unsure last tetanus No physical recently.  Did have blood work done recently. No family history of prostate or colon cancer.  Medications and allergies reviewed and updated in chart.  Past histories reviewed and updated if relevant as below. Patient Active Problem List  Diagnoses  . Diabetes mellitus  . Hypercholesterolemia  . Benign hypertensive heart disease without heart failure   Past Medical History  Diagnosis Date  . Hypertension   . Diabetes mellitus 1994  . Hyperlipidemia   . Osteoarthritis   . CRI (chronic renal insufficiency)     baseline Cr seems o be 1.7  . Urinary incontinence     s/p PTNS didn't help  . Glaucoma     and cataracts  . History of melanoma    Past Surgical History  Procedure Date  . Replacement total knee 04/2010    RIGHT KNEE  . Tonsillectomy   . Cardiovascular stress test 04/27/2010    EF 75%, nuclear stress  test with normal perfusion, no ischemia  . Foot surgery     metal pin in place  . Shoulder surgery   . Finger surgery     amputated finger   History  Substance Use Topics  . Smoking status: Never Smoker   . Smokeless tobacco: Never Used  . Alcohol Use: No   Family History  Problem Relation Age of Onset  . Stroke Mother     hemorrhage  . Diabetes Mother   . Cancer Father     lung  . Diabetes Brother    Allergies  Allergen Reactions  . Januvia (Sitagliptin Phosphate)     Possibly affected kidneys?  . Metformin And Related     CRI   Current Outpatient Prescriptions on File Prior to Visit  Medication Sig Dispense Refill  . amLODipine (NORVASC) 5 MG tablet Take 1 tablet (5 mg total) by mouth daily.  90 tablet  3  . aspirin 325 MG tablet Take 325 mg by mouth daily. OCCASSIONALLY       . glipiZIDE (GLUCOTROL) 10 MG tablet Take 1 tablet (10 mg total) by mouth daily.  90 tablet  3  . insulin glargine (LANTUS) 100 UNIT/ML injection Inject 50 Units into the skin every morning.       . rosuvastatin (CRESTOR) 10 MG tablet Take  1 tablet (10 mg total) by mouth at bedtime.  90 tablet  3  . Solifenacin Succinate (VESICARE PO) Take 10 mg by mouth daily as needed.       . Tamsulosin HCl (FLOMAX) 0.4 MG CAPS Take 0.4 mg by mouth daily.        [name] Kitchen triamterene-hydrochlorothiazide (MAXZIDE-25) 37.5-25 MG per tablet Take 1 tablet by mouth daily.  90 tablet  3   Review of Systems  Constitutional: Negative for fever, chills, activity change, appetite change, fatigue and unexpected weight change.  HENT: Negative for hearing loss and neck pain.   Eyes: Negative for visual disturbance.  Respiratory: Negative for cough, chest tightness, shortness of breath and wheezing.   Cardiovascular: Negative for chest pain, palpitations and leg swelling.  Gastrointestinal: Negative for nausea, vomiting, abdominal pain, diarrhea, constipation, blood in stool and abdominal distention.  Genitourinary: Negative for  hematuria and difficulty urinating.  Musculoskeletal: Negative for myalgias and arthralgias.  Skin: Negative for rash.  Neurological: Positive for headaches. Negative for dizziness, seizures and syncope.  Hematological: Does not bruise/bleed easily.  Psychiatric/Behavioral: Negative for dysphoric mood. The patient is not nervous/anxious.        Objective:   Physical Exam  Nursing note and vitals reviewed. Constitutional: He is oriented to person, place, and time. He appears well-developed and well-nourished. No distress.  HENT:  Head: Normocephalic and atraumatic.  Right Ear: External ear normal.  Left Ear: External ear normal.  Nose: Nose normal.  Mouth/Throat: Oropharynx is clear and moist. No oropharyngeal exudate.  Eyes: Conjunctivae and EOM are normal. Pupils are equal, round, and reactive to light. No scleral icterus.  Neck: Normal range of motion. Neck supple. Carotid bruit is not present. No thyromegaly present.  Cardiovascular: Normal rate, regular rhythm, normal heart sounds and intact distal pulses.   No murmur heard. Pulses:      Radial pulses are 2+ on the right side, and 2+ on the left side.  Pulmonary/Chest: Effort normal and breath sounds normal. No respiratory distress. He has no wheezes. He has no rales.  Abdominal: Soft. Bowel sounds are normal. He exhibits no distension and no mass. There is no tenderness. There is no rebound and no guarding.  Musculoskeletal: Normal range of motion. He exhibits no edema.       Diabetic foot exam: Normal inspection No skin breakdown No calluses  Slightly diminished DP/PT pulses Some decreased sensation to monofilament R>L  Nails normal  Lymphadenopathy:    He has no cervical adenopathy.  Neurological: He is alert and oriented to person, place, and time.       CN grossly intact, station and gait intact  Skin: Skin is warm and dry. No rash noted.  Psychiatric: He has a normal mood and affect. His behavior is normal. Judgment  and thought content normal.          Assessment & Plan:

## 2011-08-12 LAB — MICROALBUMIN / CREATININE URINE RATIO: Microalb, Ur: 4.2 mg/dL — ABNORMAL HIGH (ref 0.0–1.9)

## 2011-09-16 ENCOUNTER — Ambulatory Visit (INDEPENDENT_AMBULATORY_CARE_PROVIDER_SITE_OTHER): Payer: Medicare Other | Admitting: Family Medicine

## 2011-09-16 ENCOUNTER — Other Ambulatory Visit: Payer: Self-pay | Admitting: *Deleted

## 2011-09-16 ENCOUNTER — Encounter: Payer: Self-pay | Admitting: Family Medicine

## 2011-09-16 DIAGNOSIS — R739 Hyperglycemia, unspecified: Secondary | ICD-10-CM

## 2011-09-16 DIAGNOSIS — R7309 Other abnormal glucose: Secondary | ICD-10-CM

## 2011-09-16 DIAGNOSIS — E119 Type 2 diabetes mellitus without complications: Secondary | ICD-10-CM

## 2011-09-16 DIAGNOSIS — N189 Chronic kidney disease, unspecified: Secondary | ICD-10-CM

## 2011-09-16 MED ORDER — INSULIN LISPRO 100 UNIT/ML ~~LOC~~ SOLN: SUBCUTANEOUS | Status: DC

## 2011-09-16 MED ORDER — TAMSULOSIN HCL 0.4 MG PO CAPS: 0.4000 mg | ORAL_CAPSULE | Freq: Every day | ORAL | Status: DC

## 2011-09-16 MED ORDER — INSULIN GLARGINE 100 UNIT/ML ~~LOC~~ SOLN: 60.0000 [IU] | SUBCUTANEOUS | Status: DC

## 2011-09-16 NOTE — Progress Notes (Signed)
  Subjective:    Patient ID: Frank Simpson, male    DOB: 04-12-1931, 77 y.o.   MRN: [id number]  HPI CC: f/u DM  1 mo f/u DM.  Previously followed by Dr. Mare Ferrari.  Has been stressed recently, wonders if elevated sugars due to this.  DM - 1994 dx. Uncontrolled. On lantus and ssi humalog for years as well as glipizide.  Sugars have been running high in past week.  Sugars running 300-400 recently.  At night coming down to 200.  Denies change in diet.  Vision check recently, planning on cataract surgery 10/2011.  Intolerant of metformin 2/2 CRI and januvia on allergy list.  No low sugars.  Taking 60u lantus every morning (recently increased from 50).  has never been to diabetes education. This morning sugar 200 then went up to 400 after sausage biscuit for breakfast.  Does not watch diet. Lab Results   Component  Value  Date    HGBA1C  8.8*  07/18/2011    HTN - well controlled on current meds. Only on norvasc, maxzide and flomax.  No HA, vision changes, CP/tightness, SOB, leg swelling.  CRI - unsure baseline cr. Does use NSAIDs for arthritis. Doesn't think tylenol works as well.  Review of Systems Per HPI    Objective:   Physical Exam  Nursing note and vitals reviewed. Constitutional: He appears well-developed and well-nourished. No distress.  HENT:  Head: Normocephalic and atraumatic.  Mouth/Throat: Oropharynx is clear and moist. No oropharyngeal exudate.  Eyes: Conjunctivae and EOM are normal. Pupils are equal, round, and reactive to light. No scleral icterus.  Neck: Normal range of motion. Neck supple.  Cardiovascular: Normal rate, regular rhythm, normal heart sounds and intact distal pulses.   No murmur heard. Pulmonary/Chest: Effort normal and breath sounds normal. No respiratory distress. He has no wheezes. He has no rales.  Musculoskeletal: He exhibits no edema.  Skin: Skin is warm and dry. No rash noted.  Psychiatric: He has a normal mood and affect.      Assessment &  Plan:

## 2011-09-16 NOTE — Assessment & Plan Note (Signed)
Blood work next visit.

## 2011-09-16 NOTE — Assessment & Plan Note (Addendum)
Chronic, deteriorating despite increase in lantus. Increase again to total 75u, rtc 2 wks. If staying elevated, change SSI to mealtime insulin, start actos. If not improving, referral to endo. Goal <8% for now. Lab Results  Component Value Date   HGBA1C 8.8* 07/18/2011  set up with diabetes education. May take off glipizide next visit if starting actos.

## 2011-09-16 NOTE — Patient Instructions (Addendum)
If average of 3 morning sugars consistently >160, increase lantus by 3 units to max 75units. Send me form for diabetic shoes and I'll fill out. I'd like to set you up with diabetes education if you haven't had before.  Pass by Marion's office to set this up. You need to stay away from too much starches - potatos, bread, rice, pasta.  Try to change to whole grain, nothing white. Return to see me in 2 weeks.

## 2011-09-22 ENCOUNTER — Telehealth: Payer: Self-pay | Admitting: *Deleted

## 2011-09-22 NOTE — Telephone Encounter (Signed)
Frank Simpson needs script for diabetic shoes faxed to her, this needs to include diagnosis. Form was faxed, but she also needs the script.  Fax number [phone]

## 2011-09-22 NOTE — Telephone Encounter (Signed)
Script faxed to [employer]

## 2011-09-22 NOTE — Telephone Encounter (Signed)
Filled and routed to [place]

## 2011-09-30 ENCOUNTER — Ambulatory Visit (INDEPENDENT_AMBULATORY_CARE_PROVIDER_SITE_OTHER): Payer: Medicare Other | Admitting: Family Medicine

## 2011-09-30 ENCOUNTER — Encounter: Payer: Self-pay | Admitting: Family Medicine

## 2011-09-30 VITALS — BP 136/84 | HR 68 | Temp 97.8°F | Wt 211.1 lb

## 2011-09-30 DIAGNOSIS — E119 Type 2 diabetes mellitus without complications: Secondary | ICD-10-CM

## 2011-09-30 DIAGNOSIS — N189 Chronic kidney disease, unspecified: Secondary | ICD-10-CM

## 2011-09-30 DIAGNOSIS — E7800 Pure hypercholesterolemia, unspecified: Secondary | ICD-10-CM

## 2011-09-30 LAB — BASIC METABOLIC PANEL
BUN: 29 mg/dL — ABNORMAL HIGH (ref 6–23)
Chloride: 98 mEq/L (ref 96–112)
Glucose, Bld: 174 mg/dL — ABNORMAL HIGH (ref 70–99)
Potassium: 3.9 mEq/L (ref 3.5–5.3)
Sodium: 138 mEq/L (ref 135–145)

## 2011-09-30 LAB — HEMOGLOBIN A1C
Hgb A1c MFr Bld: 8.5 % — ABNORMAL HIGH (ref <5.7)
Mean Plasma Glucose: 197 mg/dL — ABNORMAL HIGH (ref <117)

## 2011-09-30 MED ORDER — INSULIN GLARGINE 100 UNIT/ML ~~LOC~~ SOLN: 60.0000 [IU] | SUBCUTANEOUS | Status: DC

## 2011-09-30 MED ORDER — GLIPIZIDE 10 MG PO TABS: 5.0000 mg | ORAL_TABLET | Freq: Every day | ORAL | Status: DC

## 2011-09-30 MED ORDER — INSULIN LISPRO 100 UNIT/ML ~~LOC~~ SOLN: SUBCUTANEOUS | Status: DC

## 2011-09-30 NOTE — Patient Instructions (Addendum)
Pass by Frank Simpson's office to schedule diabetes education in [place]. Foot exam today - looking good. Back off glipizide to 53m daily (1/2 pill). Return to see me in 2 months. When you see me next, bring log of sugars for the week prior.

## 2011-09-30 NOTE — Assessment & Plan Note (Signed)
Check Cr today.

## 2011-09-30 NOTE — Assessment & Plan Note (Signed)
Improved as evidenced by cbgs from patient recall. Continue lantus 60 u. Given on insulin slowly back off SU, decrease glipizide to 29m daily.  May start actos vs change SSI to mealtime RTC 2 mo for f/u. Check A1c and bmp today. I do think pt would benefit from diabetes education - will refer back - [facility]

## 2011-09-30 NOTE — Progress Notes (Signed)
  Subjective:    Patient ID: Frank Simpson, male    DOB: [birth year], 77 y.o.   MRN: [id number]  HPI CC: f/u 2wks uncontrolled DM  F/u DM - cut back on bread and diet sodas, noticed significantly improved sugars.  2 nights had low sugars to 70s, felt bad - sweaty.  Sweets brought sugar back up.  Fasting in am 120s, random sugars have not gone past 250 (prior 400s).  Currently on lantus 60units (did not increase), uses regular sliding scale, takes on average 7-8 units after meal.  Also taking glipizide 31m daily.  Lab Results  Component Value Date   HGBA1C 8.8* 07/18/2011   Thinks diet change sustainable.  Ordered diabetic shoes.  No paresthesias in legs/feet, h/o ulcers, no CP/tightness.    Has not received call from diabetes education [facility].  [facility]  [facility] to [facility] but never did.  Review of Systems Per HPI    Objective:   Physical Exam  Nursing note and vitals reviewed. Constitutional: He appears well-developed and well-nourished. No distress.  HENT:  Head: Normocephalic and atraumatic.  Right Ear: External ear normal.  Left Ear: External ear normal.  Nose: Nose normal.  Mouth/Throat: Oropharynx is clear and moist. No oropharyngeal exudate.  Eyes: Conjunctivae and EOM are normal. Pupils are equal, round, and reactive to light. No scleral icterus.  Neck: Normal range of motion. Neck supple. Carotid bruit is not present.  Cardiovascular: Normal rate, regular rhythm, normal heart sounds and intact distal pulses.   No murmur heard. Pulmonary/Chest: Effort normal and breath sounds normal. No respiratory distress. He has no wheezes. He has no rales.  Musculoskeletal: Normal range of motion. He exhibits edema (tr pedal bilaterally).       Diabetic foot exam: Normal inspection No skin breakdown No calluses  Normal DP/PT pulses Normal sensation to light tough and monofilament Nails  normal  Lymphadenopathy:    He has no cervical adenopathy.  Skin: Skin is warm and dry. No rash noted.  Psychiatric: He has a normal mood and affect.      Assessment & Plan:

## 2011-10-06 ENCOUNTER — Encounter: Payer: Self-pay | Admitting: *Deleted

## 2011-10-06 ENCOUNTER — Encounter: Payer: Medicare Other | Attending: Family Medicine | Admitting: *Deleted

## 2011-10-06 DIAGNOSIS — Z713 Dietary counseling and surveillance: Secondary | ICD-10-CM | POA: Insufficient documentation

## 2011-10-06 DIAGNOSIS — E119 Type 2 diabetes mellitus without complications: Secondary | ICD-10-CM | POA: Insufficient documentation

## 2011-10-06 NOTE — Progress Notes (Signed)
  Medical Nutrition Therapy:  Appt start time: 1100 end time:  1200.   Assessment:  Primary concerns today: Nutrition counseling and diabetes education for this patient and his wife. He states he has had Diabetes for about 30 years with no history of any diabetes education. He avoids high carbohydrate foods from experience. Wife appears very supportive and prepares most of his meals. He states he has occasional low blood sugars, either during the night or in early AM.  MEDICATIONS: Lantus 60 units in the AM. Humalog Sliding Scale pre meal based on his BG. His Glipizide dose was recently decreased from 10 to 5 mg daily. See list for other meds   DIETARY INTAKE:  Usual eating pattern includes 3 meals and no snacks per day.  Everyday foods include good variety of all food groups.  Avoided foods include soda, both regular and diet.    24-hr recall:  B ( AM): sausage and occasionally eggs, 1 slice plain toast  Snk ( AM): none  L ( PM): hot meal or sandwich Snk ( PM): none D ( PM): lean meat, vegetables and occasional starch Snk ( PM): none Beverages: crystal light type powder added to his water, coffee  Usual physical activity: yard work and working around [employer]  Estimated energy needs: 1600 calories 180 g carbohydrates 120 g protein 44 g fat  Progress Towards Goal(s):  In progress.     Intervention:  Nutrition counseling and diabetes education provided. Discussed physiology of diabetes, the value of consistent carb intake for more consistent BGs, the activity of both of the insulins he takes as well as the Glipizide, factors that cause BG to rise or fall, foot care, and treatment options for hypoglycemia. Discussed concept of taking a fixed dose of Humalog for his meals and using sliding scale to correct high BG in addition.  Handouts given during visit include:  Living with Type 2 Diabetes by the ADA  Carb Counting and Beyond handout  Monitoring/Evaluation:  Dietary  intake and more in-depth carb counting instruction, exercise, self monitoring of BG, and body weight in 4 week(s).

## 2011-10-29 HISTORY — PX: OTHER SURGICAL HISTORY: SHX169

## 2011-11-10 ENCOUNTER — Encounter: Payer: Self-pay | Admitting: Family Medicine

## 2011-11-10 ENCOUNTER — Ambulatory Visit (INDEPENDENT_AMBULATORY_CARE_PROVIDER_SITE_OTHER): Payer: Medicare Other | Admitting: Family Medicine

## 2011-11-10 VITALS — BP 142/82 | HR 58 | Temp 97.9°F | Wt 210.8 lb

## 2011-11-10 DIAGNOSIS — R1013 Epigastric pain: Secondary | ICD-10-CM

## 2011-11-10 LAB — CK TOTAL AND CKMB (NOT AT ARMC)
CK, MB: 1.8 ng/mL (ref 0.3–4.0)
Total CK: 62 U/L (ref 7–232)

## 2011-11-10 LAB — COMPREHENSIVE METABOLIC PANEL
AST: 16 U/L (ref 0–37)
Alkaline Phosphatase: 43 U/L (ref 39–117)
BUN: 26 mg/dL — ABNORMAL HIGH (ref 6–23)
Glucose, Bld: 189 mg/dL — ABNORMAL HIGH (ref 70–99)
Sodium: 139 mEq/L (ref 135–145)
Total Bilirubin: 0.6 mg/dL (ref 0.3–1.2)

## 2011-11-10 LAB — LIPASE: Lipase: 65 U/L — ABNORMAL HIGH (ref 11.0–59.0)

## 2011-11-10 LAB — TROPONIN I: Troponin I: <0.01 ng/mL (ref <0.06)

## 2011-11-10 MED ORDER — OMEPRAZOLE 40 MG PO CPDR: 40.0000 mg | DELAYED_RELEASE_CAPSULE | Freq: Every day | ORAL | Status: DC

## 2011-11-10 NOTE — Progress Notes (Signed)
Subjective:    Patient ID: Frank Simpson, male    DOB: 02-09-1931, 76 y.o.   MRN: [id number]  HPI CC: chest/epigastric pain  77 yo with h/o uncontrolled DM, HLD, HTN, CRI presents with 1 wk h/o dull and sore epigastric pain along bandlike distribution upper abdomen/lower chest that radiates to both upper arms.  No chest pain/tightness/pressure, no nausea/vomiting, diarrhea/constipation, indigestion, SOB, dizziness.  No h/o GERD.  Not food related according to patient.  No fevers/chills.  Worsened with exercise, relieved with stress.  Stays 5/10 at home.  Not currently hurting.  No significant radiation to back.  No fmhx or personal h/o CAD.  Nuclear stress test 03/2010 WNL, no ischemia.  Cards is Dr. Mare Ferrari.  Had cataract surgery last month.  Lab Results  Component Value Date   LDLCALC 80 07/18/2011   Lab Results  Component Value Date   HGBA1C 8.5* 09/30/2011  sugars per pt running improved.  Running fasting 160-200.  Lowest sugar 75. Wt Readings from Last 3 Encounters:  11/10/11 210 lb 12 oz (95.596 kg)  10/06/11 209 lb 12.8 oz (95.165 kg)  09/30/11 211 lb 1.9 oz (95.763 kg)    BP Readings from Last 3 Encounters:  11/10/11 142/82  09/30/11 136/84  09/16/11 136/80   Medications and allergies reviewed and updated in chart.  Past histories reviewed and updated if relevant as below. Patient Active Problem List  Diagnoses  . Type II or unspecified type diabetes mellitus without mention of complication, uncontrolled  . Hypercholesterolemia  . Benign hypertensive heart disease without heart failure  . Hypertension  . Osteoarthritis  . CRI (chronic renal insufficiency)  . Urinary incontinence   Past Medical History  Diagnosis Date  . Hypertension   . Diabetes mellitus 1994    dm edu 2012  . Hyperlipidemia   . Osteoarthritis   . CRI (chronic renal insufficiency)     baseline Cr seems to be 1.7  . Urinary incontinence     s/p PTNS didn't help  . Glaucoma     and  cataracts  . History of melanoma    Past Surgical History  Procedure Date  . Replacement total knee 04/2010    RIGHT KNEE  . Tonsillectomy   . Cardiovascular stress test 04/27/2010    EF 75%, nuclear stress test with normal perfusion, no ischemia  . Foot surgery     metal pin in place  . Shoulder surgery   . Finger surgery     amputated finger   History  Substance Use Topics  . Smoking status: Never Smoker   . Smokeless tobacco: Never Used  . Alcohol Use: No   Family History  Problem Relation Age of Onset  . Stroke Mother     hemorrhage  . Diabetes Mother   . Cancer Father     lung  . Diabetes Brother    Allergies  Allergen Reactions  . Januvia (Sitagliptin Phosphate)     Possibly affected kidneys?  . Metformin And Related     CRI   Current Outpatient Prescriptions on File Prior to Visit  Medication Sig Dispense Refill  . amLODipine (NORVASC) 5 MG tablet Take 1 tablet (5 mg total) by mouth daily.  90 tablet  3  . aspirin 325 MG tablet Take 325 mg by mouth daily. OCCASSIONALLY       . glipiZIDE (GLUCOTROL) 10 MG tablet Take 0.5 tablets (5 mg total) by mouth daily.      . insulin glargine (LANTUS) 100  UNIT/ML injection Inject 60 Units into the skin every morning. Qs 1 month  30 mL  3  . insulin lispro (HUMALOG) 100 UNIT/ML injection Sliding scale: 150-200 =2units, 201-250 =4units, 251-300 =6units, 301-350 =8units, >350 =10units  30 mL  3  . INSULIN SYRINGE .5CC/29G (B-D INS SYR ULTRAFINE .5CC/29G) 29G X 1/2" 0.5 ML MISC Use as directed  100 each  11  . rosuvastatin (CRESTOR) 10 MG tablet Take 1 tablet (10 mg total) by mouth at bedtime.  90 tablet  3  . Solifenacin Succinate (VESICARE PO) Take 10 mg by mouth daily as needed.       . Tamsulosin HCl (FLOMAX) 0.4 MG CAPS Take 1 capsule (0.4 mg total) by mouth daily.  90 capsule  3  . triamterene-hydrochlorothiazide (MAXZIDE-25) 37.5-25 MG per tablet Take 1 tablet by mouth daily.  90 tablet  3   Review of Systems Per HPI     Objective:   Physical Exam  Nursing note and vitals reviewed. Constitutional: He appears well-developed and well-nourished. No distress.  HENT:  Head: Normocephalic and atraumatic.  Mouth/Throat: Oropharynx is clear and moist. No oropharyngeal exudate.  Eyes: Conjunctivae and EOM are normal. Pupils are equal, round, and reactive to light. No scleral icterus.  Neck: Normal range of motion. Neck supple.  Cardiovascular: Normal rate, regular rhythm, normal heart sounds and intact distal pulses.   No murmur heard. Pulmonary/Chest: Effort normal and breath sounds normal. No respiratory distress. He has no wheezes. He has no rales.  Abdominal: Soft. Bowel sounds are normal. He exhibits no distension and no mass. There is no hepatosplenomegaly. There is tenderness (minimal) in the epigastric area. There is no rigidity, no rebound, no guarding and negative Murphy's sign. No hernia.  Musculoskeletal: He exhibits no edema.  Lymphadenopathy:    He has no cervical adenopathy.  Skin: Skin is warm and dry. No rash noted.  Psychiatric: He has a normal mood and affect.       Assessment & Plan:

## 2011-11-10 NOTE — Patient Instructions (Addendum)
This doesn't sound like the heart however with diabetes it is sometimes difficult to tell. I will check blood work on you today and will call you with results..  EKG today as well. Start taking aspirin daily while you see Dr. Mare Ferrari. Start omeprazole as this could be presentation of reflux (nexium sample for daily x 10 days provided, then start omeprazole which was sent to pharmacy).  Avoid acidic or spicy foods, sodas, citrus, chocolate for now as all these things can worsen reflux. I would like to have you pass by [facility]'s office to schedule sooner appointment with Dr. Mare Ferrari to check heart again. If any chest pain or pressure/tightness, or associated with shortness of breath or nausea, please go to ER to be evaluated.

## 2011-11-10 NOTE — Assessment & Plan Note (Addendum)
Epigastric/lower chest discomfort in bandline distribution with radiation to bilateral arms.  Very atypical of angina however is diabetic and with other risk factors including HTN, HLD, CRI.  I would appreciate cardiology input of discomfort.  Check stat cardiac enzymes today as well as CMP, CBC, lipase. EKG today - sinus brady at 57, normal intervals, no hypertrophy or ST/T changes.  unchanged from prior.   If cardiac enzymes negative, would pursue abd eval. Start omeprazole daily for next 3-4 wks to see if any improvement in discomfort.  Not typical of GERD either. update me if not improving with PPI.

## 2011-11-11 ENCOUNTER — Encounter: Payer: Self-pay | Admitting: Cardiology

## 2011-11-11 ENCOUNTER — Other Ambulatory Visit: Payer: Self-pay | Admitting: *Deleted

## 2011-11-11 ENCOUNTER — Ambulatory Visit (INDEPENDENT_AMBULATORY_CARE_PROVIDER_SITE_OTHER): Payer: Medicare Other | Admitting: Cardiology

## 2011-11-11 DIAGNOSIS — E119 Type 2 diabetes mellitus without complications: Secondary | ICD-10-CM

## 2011-11-11 DIAGNOSIS — E7800 Pure hypercholesterolemia, unspecified: Secondary | ICD-10-CM

## 2011-11-11 DIAGNOSIS — R079 Chest pain, unspecified: Secondary | ICD-10-CM | POA: Insufficient documentation

## 2011-11-11 DIAGNOSIS — I119 Hypertensive heart disease without heart failure: Secondary | ICD-10-CM

## 2011-11-11 DIAGNOSIS — M1990 Unspecified osteoarthritis, unspecified site: Secondary | ICD-10-CM

## 2011-11-11 LAB — SEDIMENTATION RATE: Sed Rate: 10 mm/hr (ref 0–22)

## 2011-11-11 MED ORDER — NITROGLYCERIN 0.4 MG SL SUBL: 0.4000 mg | SUBLINGUAL_TABLET | SUBLINGUAL | Status: DC | PRN

## 2011-11-11 NOTE — Assessment & Plan Note (Signed)
Patient has not been having any myalgias of his lower extremities.  It is possible that some of his upper trunk discomfort could be secondary to his statin therapy but I would seem somewhat unusual.  CK level was drawn yesterday as part of the cardiac enzyme panel which was normal

## 2011-11-11 NOTE — Assessment & Plan Note (Signed)
Blood pressure has been remaining stable on current therapy.

## 2011-11-11 NOTE — Patient Instructions (Signed)
Your physician recommends that you schedule a follow-up appointment in: 2 months, app set  Your physician recommends that you return for lab work in: TODAY= sed rate  Your physician has requested that you have a lexiscan myoview. For further information please visit [email or web address]. Please follow instruction sheet, as given.

## 2011-11-11 NOTE — Progress Notes (Signed)
Frank Simpson Date of Birth:  Jun 04, 1931 [facility] [address]   [place], [place]  [address] [phone]           Fax   [phone]  History of Present Illness: This pleasant 77 year old gentleman is seen as a work in the office visit today.  He was seen at [facility].  The past week or so he has been complaining of soreness and discomfort across his shoulders chest and epigastric area.  It is present day and night.  It hurts him to turn over in bed.  He has been radiation down into the arms.  He has not been experiencing any increased shortness of breath associated with these symptoms.  He's had no fever chills or night sweats.  He has not tried any sublingual nitroglycerin for the discomfort.  He does not have a history of known ischemic heart disease and he had a previously normal nuclear stress test in May 2011 with an ejection fraction of 75%.  The patient does have multiple risk factors for ischemic heart disease including high blood pressure diabetes and hyperlipidemia.  Current Outpatient Prescriptions  Medication Sig Dispense Refill  . amLODipine (NORVASC) 5 MG tablet Take 1 tablet (5 mg total) by mouth daily.  90 tablet  3  . aspirin 325 MG tablet Take 325 mg by mouth daily. OCCASSIONALLY       . esomeprazole (NEXIUM) 40 MG capsule Take 40 mg by mouth daily before breakfast.        . glipiZIDE (GLUCOTROL) 10 MG tablet Take 0.5 tablets (5 mg total) by mouth daily.      . insulin glargine (LANTUS) 100 UNIT/ML injection Inject 60 Units into the skin every morning. Qs 1 month  30 mL  3  . insulin lispro (HUMALOG) 100 UNIT/ML injection Sliding scale: 150-200 =2units, 201-250 =4units, 251-300 =6units, 301-350 =8units, >350 =10units  30 mL  3  . INSULIN SYRINGE .5CC/29G (B-D INS SYR ULTRAFINE .5CC/29G) 29G X 1/2" 0.5 ML MISC Use as directed  100 each  11  . rosuvastatin (CRESTOR) 10 MG tablet Take 1 tablet (10 mg total) by mouth at  bedtime.  90 tablet  3  . Solifenacin Succinate (VESICARE PO) Take 10 mg by mouth daily as needed.       . Tamsulosin HCl (FLOMAX) 0.4 MG CAPS Take 1 capsule (0.4 mg total) by mouth daily.  90 capsule  3  . triamterene-hydrochlorothiazide (MAXZIDE-25) 37.5-25 MG per tablet Take 1 tablet by mouth daily.  90 tablet  3  . nitroGLYCERIN (NITROSTAT) 0.4 MG SL tablet Place 1 tablet (0.4 mg total) under the tongue every 5 (five) minutes as needed for chest pain. May use up to 3 doses, if pain continues call 911  25 tablet  3  . omeprazole (PRILOSEC) 40 MG capsule Take 1 capsule (40 mg total) by mouth daily.  30 capsule  0    Allergies  Allergen Reactions  . Januvia (Sitagliptin Phosphate)     Possibly affected kidneys?  . Metformin And Related     CRI    Patient Active Problem List  Diagnoses  . Type II or unspecified type diabetes mellitus without mention of complication, uncontrolled  . Hypercholesterolemia  . Benign hypertensive heart disease without heart failure  . Hypertension  . Osteoarthritis  . CRI (chronic renal insufficiency)  . Urinary incontinence  . Epigastric pain    History  Smoking status  . Never Smoker  Smokeless tobacco  . Never Used    History  Alcohol Use No    Family History  Problem Relation Age of Onset  . Stroke Mother     hemorrhage  . Diabetes Mother   . Cancer Father     lung  . Diabetes Brother     Review of Systems: Constitutional: no fever chills diaphoresis or fatigue or change in weight.  Head and neck: no hearing loss, no epistaxis, no photophobia or visual disturbance. Respiratory: No cough, shortness of breath or wheezing. Cardiovascular: No chest pain peripheral edema, palpitations. Gastrointestinal: No abdominal distention, no abdominal pain, no change in bowel habits hematochezia or melena. Genitourinary: No dysuria, no frequency, no urgency, no nocturia. Musculoskeletal:No arthralgias, no back pain, no gait disturbance or  myalgias. Neurological: No dizziness, no headaches, no numbness, no seizures, no syncope, no weakness, no tremors. Hematologic: No lymphadenopathy, no easy bruising. Psychiatric: No confusion, no hallucinations, no sleep disturbance.    Physical Exam: Filed Vitals:   11/11/11 0932  BP: 146/78  Pulse: 70   the general appearance reveals a well-developed well-nourished, no acute distress.Pupils equal and reactive.   Extraocular Movements are full.  There is no scleral icterus.  The mouth and pharynx are normal.  The neck is supple.  The carotids reveal no bruits.  The jugular venous pressure is normal.  The thyroid is not enlarged.  There is no lymphadenopathy.  The chest is clear to percussion and auscultation. There are no rales or rhonchi. Expansion of the chest is symmetrical.  There is no tenderness of the biceps muscles or his chest wall.The abdomen is soft and nontender. Bowel sounds are normal. The liver and spleen are not enlarged. There Are no abdominal masses. There are no bruits.  Extremities no phlebitisStrength is normal and symmetrical in all extremities.  There is no lateralizing weakness.  There are no sensory deficits.  The skin is warm and dry.  There is no rash.    Assessment / Plan: Obtain a sedimentation rate today to be sure that he does not have polymyalgia rheumatica as the cause of his upper trunk symptoms. He has multiple risk factors for ischemic heart disease and we will have him return for a LEXA scan Myoview soon. We have prescribed sublingual nitroglycerin and he will use that as a therapeutic trial

## 2011-11-11 NOTE — Assessment & Plan Note (Signed)
History chest discomfort is atypical.  It is present constantly.  It hurts into move his shoulders from side to side and it hurts him to roll over in bed.  He points to the lower chest and upper epigastrium when asked where the pain is worse.  He also notes some aching and pain in both upper arms but is not particularly tender to firm palpation.  He said no diaphoresis nausea or vomiting or increased dyspnea.  His EKG done yesterday was reviewed and is normal and shows no ischemic changes.

## 2011-11-15 ENCOUNTER — Telehealth: Payer: Self-pay | Admitting: *Deleted

## 2011-11-15 NOTE — Telephone Encounter (Signed)
Message copied by Earvin Hansen on Tue Nov 15, 2011  5:38 PM ------      Message from: Darlin Coco      Created: Fri Nov 11, 2011  7:49 PM       Sed rate is normal so his Sx are not from PMR.

## 2011-11-15 NOTE — Telephone Encounter (Signed)
Advised of labs

## 2011-11-16 ENCOUNTER — Ambulatory Visit (HOSPITAL_COMMUNITY): Payer: Medicare Other | Attending: Cardiology | Admitting: Radiology

## 2011-11-16 ENCOUNTER — Encounter (HOSPITAL_COMMUNITY): Payer: Self-pay | Admitting: Radiology

## 2011-11-16 DIAGNOSIS — I10 Essential (primary) hypertension: Secondary | ICD-10-CM | POA: Insufficient documentation

## 2011-11-16 DIAGNOSIS — I119 Hypertensive heart disease without heart failure: Secondary | ICD-10-CM

## 2011-11-16 DIAGNOSIS — E119 Type 2 diabetes mellitus without complications: Secondary | ICD-10-CM | POA: Insufficient documentation

## 2011-11-16 DIAGNOSIS — Z794 Long term (current) use of insulin: Secondary | ICD-10-CM | POA: Insufficient documentation

## 2011-11-16 DIAGNOSIS — M1990 Unspecified osteoarthritis, unspecified site: Secondary | ICD-10-CM

## 2011-11-16 DIAGNOSIS — E7800 Pure hypercholesterolemia, unspecified: Secondary | ICD-10-CM | POA: Insufficient documentation

## 2011-11-16 DIAGNOSIS — E785 Hyperlipidemia, unspecified: Secondary | ICD-10-CM | POA: Insufficient documentation

## 2011-11-16 DIAGNOSIS — R079 Chest pain, unspecified: Secondary | ICD-10-CM | POA: Insufficient documentation

## 2011-11-16 MED ORDER — REGADENOSON 0.4 MG/5ML IV SOLN
0.4000 mg | Freq: Once | INTRAVENOUS | Status: AC
  Administered 2011-11-16: 0.4 mg via INTRAVENOUS

## 2011-11-16 MED ORDER — TECHNETIUM TC 99M TETROFOSMIN IV KIT
11.0000 | PACK | Freq: Once | INTRAVENOUS | Status: AC | PRN
  Administered 2011-11-16: 11 via INTRAVENOUS

## 2011-11-16 MED ORDER — TECHNETIUM TC 99M TETROFOSMIN IV KIT
33.0000 | PACK | Freq: Once | INTRAVENOUS | Status: AC | PRN
  Administered 2011-11-16: 33 via INTRAVENOUS

## 2011-11-16 NOTE — Progress Notes (Signed)
[place] [address] [phone]  Cardiology Nuclear Med Study  Frank Simpson is a 77 y.o. male [id number] [birth year]   Nuclear Med Background Indication for Stress Test:  Evaluation for Ischemia History:  Myocardial Perfusion Study-2011 EF 75% No Ischemia Cardiac Risk Factors: Hypertension, IDDM Type 2 and Lipids  Symptoms:  Chest Pain and Chest Pain with Exertion (last date of chest discomfort- constant)   Nuclear Pre-Procedure Caffeine/Decaff Intake:  None NPO After: 7:00pm   Lungs:  Clear IV 0.9% NS with Angio Cath:  20g  IV Site: R Antecubital  IV Started by:  Eliezer Lofts, EMT-P  Chest Size (in):  46 Cup Size: n/a  Height: 6' (1.829 m)  Weight:  208 lb (94.348 kg)  BMI:  Body mass index is 28.21 kg/(m^2). Tech Comments:  CBG= 190 @ 8:00 am, per patient.    Nuclear Med Study 1 or 2 day study: 1 day  Stress Test Type:  Lexiscan  Reading MD: Dola Argyle, MD  Order Authorizing Provider:  Darlin Coco, MD  Resting Radionuclide: Technetium 88mTetrofosmin  Resting Radionuclide Dose: 11.0 mCi   Stress Radionuclide:  Technetium 969metrofosmin  Stress Radionuclide Dose: 33.0 mCi           Stress Protocol Rest HR: 56 Stress HR: 56  Rest BP: 132/80 Stress BP: 132/82  Exercise Time (min): n/a METS: n/a   Predicted Max HR: 142 bpm % Max HR: 52.82 bpm Rate Pressure Product: 9900   Dose of Adenosine (mg):  n/a Dose of Lexiscan: 0.4 mg  Dose of Atropine (mg): n/a Dose of Dobutamine: n/a mcg/kg/min (at max HR)  Stress Test Technologist: JaCrissie FiguresRN  Nuclear Technologist:  StCharlton AmorCNMT     Rest Procedure:  Myocardial perfusion imaging was performed at rest 45 minutes following the intravenous administration of Technetium 9974mtrofosmin. Rest ECG: NSR  Stress Procedure:  The patient received IV Lexiscan 0.4 mg over 15-seconds.  Technetium 47m75mrofosmin injected at 30-seconds.  There were  no significant changes with Lexiscan.  Quantitative spect images were obtained after a 45 minute delay. Stress ECG: No significant change from baseline ECG  QPS Raw Data Images:  Patient motion noted; appropriate software correction applied. Stress Images:  Normal homogeneous uptake in all areas of the myocardium. Rest Images:  Normal homogeneous uptake in all areas of the myocardium. Subtraction (SDS):  No evidence of ischemia. Transient Ischemic Dilatation (Normal <1.22):  0.95 Lung/Heart Ratio (Normal <0.45):  0.29  Quantitative Gated Spect Images QGS EDV:  86 ml QGS ESV:  34 ml QGS cine images:  Normal Wall Motion QGS EF: 60%  Impression Exercise Capacity:  Lexiscan with low level exercise. BP Response:  Normal blood pressure response. Clinical Symptoms:  No chest pain. ECG Impression:  No significant ST segment change suggestive of ischemia. Comparison with Prior Nuclear Study: No significant change from previous study  Overall Impression:  Normal stress nuclear study.  JeffDola Argyle

## 2011-11-24 ENCOUNTER — Telehealth: Payer: Self-pay | Admitting: *Deleted

## 2011-11-24 ENCOUNTER — Telehealth: Payer: Self-pay | Admitting: Cardiology

## 2011-11-24 NOTE — Telephone Encounter (Signed)
Message copied by Earvin Hansen on Thu Nov 24, 2011  3:40 PM ------      Message from: Darlin Coco      Created: Thu Nov 17, 2011 10:25 AM       Please report.  The stress test was normal.  There was no ischemia.  The left ventricular ejection fraction was normal at 60%.  Continue same meds

## 2011-11-24 NOTE — Telephone Encounter (Signed)
Advised of stress test results

## 2011-11-24 NOTE — Telephone Encounter (Signed)
New problem:  Test results.

## 2011-12-01 ENCOUNTER — Encounter: Payer: Self-pay | Admitting: Family Medicine

## 2011-12-01 ENCOUNTER — Ambulatory Visit (INDEPENDENT_AMBULATORY_CARE_PROVIDER_SITE_OTHER): Payer: Medicare Other | Admitting: Family Medicine

## 2011-12-01 DIAGNOSIS — I119 Hypertensive heart disease without heart failure: Secondary | ICD-10-CM

## 2011-12-01 DIAGNOSIS — M25519 Pain in unspecified shoulder: Secondary | ICD-10-CM

## 2011-12-01 DIAGNOSIS — R079 Chest pain, unspecified: Secondary | ICD-10-CM

## 2011-12-01 DIAGNOSIS — R1013 Epigastric pain: Secondary | ICD-10-CM

## 2011-12-01 MED ORDER — INSULIN LISPRO 100 UNIT/ML ~~LOC~~ SOLN: SUBCUTANEOUS | Status: DC

## 2011-12-01 MED ORDER — CYCLOBENZAPRINE HCL 5 MG PO TABS: 5.0000 mg | ORAL_TABLET | Freq: Two times a day (BID) | ORAL | Status: AC | PRN

## 2011-12-01 NOTE — Patient Instructions (Signed)
I think you have developed frozen shoulder of both shoulders left greater than right. Treat with  Stretching exercises provided, also use flexeril for muscle spasm/tightness - caution it can make you sleepy so consider starting with 1/2 pill. Return in 1 month for follow up.

## 2011-12-01 NOTE — Progress Notes (Signed)
Subjective:    Patient ID: Frank Simpson, male    DOB: [birth year], 78 y.o.   MRN: [id number]  HPI CC: 2 mo f/u   Here for 2 mo f/u but actually wants to discuss shoulder pain.  Several mo h/o bilateral shoulder pain, chest pain, upper back and upper arm pain, worse on left side.  Worse at night, keeps up from sleep 2/2 pain.  Pain described as sharp stabbing but also constant achey.  Improved if doesn't move shoulder.  Hasn't tried any meds other than tylenol which doesn't really help.  Shooting pain down arms to elbows.  + left neck pain.  Denies paresthesias in arms or weakness.  H/o right RTC surgery remotely.  Denies inciting injury or trauma.  Denies fevers/chills.  Last orho seen was Dr. Reynaldo Minium for knee replacement.  Last month evaluated for description of epigastric/chest pain.  No ho GERD, however last month I wondered if some dyspepsia component so did trail of PPI (nexium x 10 days) which did not help.   I asked for eval by cards of atypical chest discomfort last month, revealed normal lexiscan negative for ischemia.  Dr. Mare Ferrari also checked ESR which was normal, effectively ruling out PMR ascause of upper extremity weakness.  Statins were not thought to be causing this.  To have cataract surgery 12/13/2011.  Medications and allergies reviewed and updated in chart.  Past histories reviewed and updated if relevant as below. Patient Active Problem List  Diagnoses  . Type II or unspecified type diabetes mellitus without mention of complication, uncontrolled  . Hypercholesterolemia  . Benign hypertensive heart disease without heart failure  . Hypertension  . Osteoarthritis  . CRI (chronic renal insufficiency)  . Urinary incontinence  . Epigastric pain  . Chest pain   Past Medical History  Diagnosis Date  . Hypertension   . Diabetes mellitus 1994    dm edu 2012  . Hyperlipidemia   . Osteoarthritis   . CRI (chronic renal insufficiency)     baseline Cr seems to be 1.7    . Urinary incontinence     s/p PTNS didn't help  . Glaucoma     and cataracts  . History of melanoma    Past Surgical History  Procedure Date  . Replacement total knee 04/2010    RIGHT KNEE  . Tonsillectomy   . Cardiovascular stress test 04/27/2010    EF 75%, nuclear stress test with normal perfusion, no ischemia  . Foot surgery     metal pin in place  . Shoulder surgery   . Finger surgery     amputated finger   History  Substance Use Topics  . Smoking status: Never Smoker   . Smokeless tobacco: Never Used  . Alcohol Use: No   Family History  Problem Relation Age of Onset  . Stroke Mother     hemorrhage  . Diabetes Mother   . Cancer Father     lung  . Diabetes Brother    Allergies  Allergen Reactions  . Januvia (Sitagliptin Phosphate)     Possibly affected kidneys?  . Metformin And Related     CRI   Current Outpatient Prescriptions on File Prior to Visit  Medication Sig Dispense Refill  . amLODipine (NORVASC) 5 MG tablet Take 1 tablet (5 mg total) by mouth daily.  90 tablet  3  . aspirin 325 MG tablet Take 325 mg by mouth daily. OCCASSIONALLY       . glipiZIDE (GLUCOTROL) 10  MG tablet Take 0.5 tablets (5 mg total) by mouth daily.      . insulin glargine (LANTUS) 100 UNIT/ML injection Inject 60 Units into the skin every morning. Qs 1 month  30 mL  3  . insulin lispro (HUMALOG) 100 UNIT/ML injection Sliding scale: 150-200 =2units, 201-250 =4units, 251-300 =6units, 301-350 =8units, >350 =10units  30 mL  3  . INSULIN SYRINGE .5CC/29G (B-D INS SYR ULTRAFINE .5CC/29G) 29G X 1/2" 0.5 ML MISC Use as directed  100 each  11  . rosuvastatin (CRESTOR) 10 MG tablet Take 1 tablet (10 mg total) by mouth at bedtime.  90 tablet  3  . Tamsulosin HCl (FLOMAX) 0.4 MG CAPS Take 1 capsule (0.4 mg total) by mouth daily.  90 capsule  3  . triamterene-hydrochlorothiazide (MAXZIDE-25) 37.5-25 MG per tablet Take 1 tablet by mouth daily.  90 tablet  3  . nitroGLYCERIN (NITROSTAT) 0.4 MG SL  tablet Place 1 tablet (0.4 mg total) under the tongue every 5 (five) minutes as needed for chest pain. May take up to three doses,if pain continues call 911  25 tablet  3  . Solifenacin Succinate (VESICARE PO) Take 10 mg by mouth daily as needed.        Review of Systems Per HPI    Objective:   Physical Exam  Nursing note and vitals reviewed. Constitutional: He appears well-developed and well-nourished. No distress.  HENT:  Head: Normocephalic and atraumatic.  Neck: Normal range of motion. Neck supple.  Musculoskeletal:       FROM at neck, no midline spine tenderness, + tight paraspinous mm on left >right. Limited ROM shoulder with abduction both active and passive past ~100 deg L>R. Strength intact with testing SITS. Neg crossover test. Neg speed, yergason test.  Lymphadenopathy:    He has no cervical adenopathy.  Neurological: He is alert.       equally diminished reflexes BUE  Skin: Skin is warm and dry. No rash noted.  Psychiatric: He has a normal mood and affect.       Assessment & Plan:

## 2011-12-01 NOTE — Assessment & Plan Note (Signed)
Elevated today, however is uncomfortable.  Reassess next visit.  Has been well controlled in past. Consider change to or addition of ACEI in future.

## 2011-12-01 NOTE — Assessment & Plan Note (Signed)
Reassuringly negative cardiac workup last month. Seems to be resolving.

## 2011-12-01 NOTE — Assessment & Plan Note (Signed)
Not improved on PPI.  However seems to be resolving.

## 2011-12-01 NOTE — Assessment & Plan Note (Signed)
Anticipate developing frozen shoulder L>R.  Also possible component of bursitis. Given some trap tightness, will treat with low dose flexeril as well as provided with stretching exercises for frozen shoulder, showed exercises to help preserve mobility. Discussed ortho referral, pt prefers to wait for cataract surgery prior to seeing ortho.  Will have him return in 1 mo, consider steroid injection vs referral to ortho. Risk factors for adhesive capsulitis include uncontrolled DM. Lab Results  Component Value Date   HGBA1C 8.5* 09/30/2011

## 2011-12-06 DIAGNOSIS — H2510 Age-related nuclear cataract, unspecified eye: Secondary | ICD-10-CM | POA: Diagnosis not present

## 2011-12-13 DIAGNOSIS — H2510 Age-related nuclear cataract, unspecified eye: Secondary | ICD-10-CM | POA: Diagnosis not present

## 2011-12-13 DIAGNOSIS — H538 Other visual disturbances: Secondary | ICD-10-CM | POA: Diagnosis not present

## 2011-12-13 DIAGNOSIS — H269 Unspecified cataract: Secondary | ICD-10-CM | POA: Diagnosis not present

## 2012-01-03 ENCOUNTER — Ambulatory Visit: Payer: Medicare Other | Admitting: Family Medicine

## 2012-01-12 ENCOUNTER — Ambulatory Visit: Payer: Medicare Other | Admitting: Cardiology

## 2012-01-19 ENCOUNTER — Encounter (INDEPENDENT_AMBULATORY_CARE_PROVIDER_SITE_OTHER): Payer: Medicare Other | Admitting: Ophthalmology

## 2012-01-19 DIAGNOSIS — E11319 Type 2 diabetes mellitus with unspecified diabetic retinopathy without macular edema: Secondary | ICD-10-CM | POA: Diagnosis not present

## 2012-01-19 DIAGNOSIS — H35379 Puckering of macula, unspecified eye: Secondary | ICD-10-CM

## 2012-01-19 DIAGNOSIS — H35039 Hypertensive retinopathy, unspecified eye: Secondary | ICD-10-CM

## 2012-01-19 DIAGNOSIS — E1165 Type 2 diabetes mellitus with hyperglycemia: Secondary | ICD-10-CM

## 2012-01-19 DIAGNOSIS — E1139 Type 2 diabetes mellitus with other diabetic ophthalmic complication: Secondary | ICD-10-CM | POA: Diagnosis not present

## 2012-01-19 DIAGNOSIS — I10 Essential (primary) hypertension: Secondary | ICD-10-CM | POA: Diagnosis not present

## 2012-01-19 DIAGNOSIS — H3530 Unspecified macular degeneration: Secondary | ICD-10-CM

## 2012-01-24 ENCOUNTER — Encounter: Payer: Self-pay | Admitting: Family Medicine

## 2012-03-01 DIAGNOSIS — H40019 Open angle with borderline findings, low risk, unspecified eye: Secondary | ICD-10-CM | POA: Diagnosis not present

## 2012-03-06 ENCOUNTER — Encounter (INDEPENDENT_AMBULATORY_CARE_PROVIDER_SITE_OTHER): Payer: Medicare Other | Admitting: Ophthalmology

## 2012-03-23 DIAGNOSIS — L578 Other skin changes due to chronic exposure to nonionizing radiation: Secondary | ICD-10-CM | POA: Diagnosis not present

## 2012-03-23 DIAGNOSIS — L570 Actinic keratosis: Secondary | ICD-10-CM | POA: Diagnosis not present

## 2012-04-26 DIAGNOSIS — M25579 Pain in unspecified ankle and joints of unspecified foot: Secondary | ICD-10-CM | POA: Diagnosis not present

## 2012-04-26 DIAGNOSIS — S2249XA Multiple fractures of ribs, unspecified side, initial encounter for closed fracture: Secondary | ICD-10-CM | POA: Diagnosis not present

## 2012-04-26 DIAGNOSIS — S63509A Unspecified sprain of unspecified wrist, initial encounter: Secondary | ICD-10-CM | POA: Diagnosis not present

## 2012-04-26 DIAGNOSIS — M25539 Pain in unspecified wrist: Secondary | ICD-10-CM | POA: Diagnosis not present

## 2012-05-17 DIAGNOSIS — H40019 Open angle with borderline findings, low risk, unspecified eye: Secondary | ICD-10-CM | POA: Diagnosis not present

## 2012-05-17 DIAGNOSIS — E11319 Type 2 diabetes mellitus with unspecified diabetic retinopathy without macular edema: Secondary | ICD-10-CM | POA: Diagnosis not present

## 2012-05-17 DIAGNOSIS — H35379 Puckering of macula, unspecified eye: Secondary | ICD-10-CM | POA: Diagnosis not present

## 2012-05-17 DIAGNOSIS — H35359 Cystoid macular degeneration, unspecified eye: Secondary | ICD-10-CM | POA: Diagnosis not present

## 2012-05-17 DIAGNOSIS — H26499 Other secondary cataract, unspecified eye: Secondary | ICD-10-CM | POA: Diagnosis not present

## 2012-05-17 DIAGNOSIS — E1165 Type 2 diabetes mellitus with hyperglycemia: Secondary | ICD-10-CM | POA: Diagnosis not present

## 2012-06-06 ENCOUNTER — Encounter (INDEPENDENT_AMBULATORY_CARE_PROVIDER_SITE_OTHER): Payer: Medicare Other | Admitting: Ophthalmology

## 2012-06-06 DIAGNOSIS — E1165 Type 2 diabetes mellitus with hyperglycemia: Secondary | ICD-10-CM

## 2012-06-06 DIAGNOSIS — E11319 Type 2 diabetes mellitus with unspecified diabetic retinopathy without macular edema: Secondary | ICD-10-CM

## 2012-06-06 DIAGNOSIS — H35039 Hypertensive retinopathy, unspecified eye: Secondary | ICD-10-CM | POA: Diagnosis not present

## 2012-06-06 DIAGNOSIS — H43819 Vitreous degeneration, unspecified eye: Secondary | ICD-10-CM

## 2012-06-06 DIAGNOSIS — H3530 Unspecified macular degeneration: Secondary | ICD-10-CM

## 2012-06-06 DIAGNOSIS — I10 Essential (primary) hypertension: Secondary | ICD-10-CM

## 2012-06-06 DIAGNOSIS — E1139 Type 2 diabetes mellitus with other diabetic ophthalmic complication: Secondary | ICD-10-CM

## 2012-06-06 DIAGNOSIS — H35359 Cystoid macular degeneration, unspecified eye: Secondary | ICD-10-CM

## 2012-07-18 ENCOUNTER — Encounter (INDEPENDENT_AMBULATORY_CARE_PROVIDER_SITE_OTHER): Payer: Medicare Other | Admitting: Ophthalmology

## 2012-07-18 DIAGNOSIS — H35039 Hypertensive retinopathy, unspecified eye: Secondary | ICD-10-CM

## 2012-07-18 DIAGNOSIS — E1139 Type 2 diabetes mellitus with other diabetic ophthalmic complication: Secondary | ICD-10-CM

## 2012-07-18 DIAGNOSIS — H3530 Unspecified macular degeneration: Secondary | ICD-10-CM

## 2012-07-18 DIAGNOSIS — H35359 Cystoid macular degeneration, unspecified eye: Secondary | ICD-10-CM | POA: Diagnosis not present

## 2012-07-18 DIAGNOSIS — H43819 Vitreous degeneration, unspecified eye: Secondary | ICD-10-CM

## 2012-07-18 DIAGNOSIS — E11319 Type 2 diabetes mellitus with unspecified diabetic retinopathy without macular edema: Secondary | ICD-10-CM | POA: Diagnosis not present

## 2012-07-18 DIAGNOSIS — I10 Essential (primary) hypertension: Secondary | ICD-10-CM

## 2012-07-29 HISTORY — PX: CORONARY STENT PLACEMENT: SHX1402

## 2012-08-06 ENCOUNTER — Inpatient Hospital Stay (HOSPITAL_COMMUNITY)
Admission: EM | Admit: 2012-08-06 | Discharge: 2012-08-09 | DRG: 247 | Disposition: A | Discharge status: Home / Self Care | Payer: Medicare Other | Attending: Cardiovascular Disease | Admitting: Cardiovascular Disease

## 2012-08-06 ENCOUNTER — Encounter (HOSPITAL_COMMUNITY): Payer: Self-pay | Admitting: Emergency Medicine

## 2012-08-06 ENCOUNTER — Encounter (HOSPITAL_COMMUNITY): Payer: Self-pay

## 2012-08-06 ENCOUNTER — Encounter (HOSPITAL_COMMUNITY)
Admission: EM | Disposition: A | Discharge status: Home / Self Care | Payer: Self-pay | Source: Home / Self Care | Attending: Cardiovascular Disease

## 2012-08-06 ENCOUNTER — Ambulatory Visit (HOSPITAL_COMMUNITY): Admit: 2012-08-06 | Payer: Self-pay | Admitting: Cardiovascular Disease

## 2012-08-06 DIAGNOSIS — Z833 Family history of diabetes mellitus: Secondary | ICD-10-CM

## 2012-08-06 DIAGNOSIS — H269 Unspecified cataract: Secondary | ICD-10-CM | POA: Diagnosis present

## 2012-08-06 DIAGNOSIS — E119 Type 2 diabetes mellitus without complications: Secondary | ICD-10-CM | POA: Diagnosis present

## 2012-08-06 DIAGNOSIS — IMO0002 Reserved for concepts with insufficient information to code with codable children: Secondary | ICD-10-CM | POA: Diagnosis not present

## 2012-08-06 DIAGNOSIS — Z7902 Long term (current) use of antithrombotics/antiplatelets: Secondary | ICD-10-CM | POA: Diagnosis not present

## 2012-08-06 DIAGNOSIS — N183 Chronic kidney disease, stage 3 unspecified: Secondary | ICD-10-CM | POA: Diagnosis present

## 2012-08-06 DIAGNOSIS — Z955 Presence of coronary angioplasty implant and graft: Secondary | ICD-10-CM

## 2012-08-06 DIAGNOSIS — D696 Thrombocytopenia, unspecified: Secondary | ICD-10-CM | POA: Diagnosis present

## 2012-08-06 DIAGNOSIS — N189 Chronic kidney disease, unspecified: Secondary | ICD-10-CM

## 2012-08-06 DIAGNOSIS — I2510 Atherosclerotic heart disease of native coronary artery without angina pectoris: Secondary | ICD-10-CM | POA: Diagnosis not present

## 2012-08-06 DIAGNOSIS — Z79899 Other long term (current) drug therapy: Secondary | ICD-10-CM

## 2012-08-06 DIAGNOSIS — R079 Chest pain, unspecified: Secondary | ICD-10-CM

## 2012-08-06 DIAGNOSIS — I200 Unstable angina: Secondary | ICD-10-CM | POA: Diagnosis not present

## 2012-08-06 DIAGNOSIS — Y840 Cardiac catheterization as the cause of abnormal reaction of the patient, or of later complication, without mention of misadventure at the time of the procedure: Secondary | ICD-10-CM | POA: Diagnosis not present

## 2012-08-06 DIAGNOSIS — E785 Hyperlipidemia, unspecified: Secondary | ICD-10-CM | POA: Diagnosis present

## 2012-08-06 DIAGNOSIS — Z794 Long term (current) use of insulin: Secondary | ICD-10-CM

## 2012-08-06 DIAGNOSIS — E7800 Pure hypercholesterolemia, unspecified: Secondary | ICD-10-CM

## 2012-08-06 DIAGNOSIS — Z888 Allergy status to other drugs, medicaments and biological substances status: Secondary | ICD-10-CM | POA: Diagnosis not present

## 2012-08-06 DIAGNOSIS — I2582 Chronic total occlusion of coronary artery: Secondary | ICD-10-CM | POA: Diagnosis present

## 2012-08-06 DIAGNOSIS — I1310 Hypertensive heart and chronic kidney disease without heart failure, with stage 1 through stage 4 chronic kidney disease, or unspecified chronic kidney disease: Secondary | ICD-10-CM | POA: Diagnosis present

## 2012-08-06 DIAGNOSIS — E876 Hypokalemia: Secondary | ICD-10-CM | POA: Diagnosis not present

## 2012-08-06 DIAGNOSIS — I472 Ventricular tachycardia, unspecified: Secondary | ICD-10-CM | POA: Diagnosis not present

## 2012-08-06 DIAGNOSIS — I2109 ST elevation (STEMI) myocardial infarction involving other coronary artery of anterior wall: Principal | ICD-10-CM | POA: Diagnosis present

## 2012-08-06 DIAGNOSIS — H409 Unspecified glaucoma: Secondary | ICD-10-CM | POA: Diagnosis present

## 2012-08-06 DIAGNOSIS — I4729 Other ventricular tachycardia: Secondary | ICD-10-CM | POA: Diagnosis not present

## 2012-08-06 DIAGNOSIS — Z823 Family history of stroke: Secondary | ICD-10-CM

## 2012-08-06 DIAGNOSIS — I219 Acute myocardial infarction, unspecified: Secondary | ICD-10-CM | POA: Diagnosis not present

## 2012-08-06 DIAGNOSIS — E1140 Type 2 diabetes mellitus with diabetic neuropathy, unspecified: Secondary | ICD-10-CM | POA: Diagnosis present

## 2012-08-06 DIAGNOSIS — IMO0001 Reserved for inherently not codable concepts without codable children: Secondary | ICD-10-CM | POA: Diagnosis not present

## 2012-08-06 DIAGNOSIS — Z7982 Long term (current) use of aspirin: Secondary | ICD-10-CM

## 2012-08-06 DIAGNOSIS — E1122 Type 2 diabetes mellitus with diabetic chronic kidney disease: Secondary | ICD-10-CM | POA: Diagnosis present

## 2012-08-06 DIAGNOSIS — I369 Nonrheumatic tricuspid valve disorder, unspecified: Secondary | ICD-10-CM | POA: Diagnosis not present

## 2012-08-06 DIAGNOSIS — Z880 Allergy status to penicillin: Secondary | ICD-10-CM

## 2012-08-06 DIAGNOSIS — I129 Hypertensive chronic kidney disease with stage 1 through stage 4 chronic kidney disease, or unspecified chronic kidney disease: Secondary | ICD-10-CM | POA: Diagnosis not present

## 2012-08-06 DIAGNOSIS — Y9210 Unspecified residential institution as the place of occurrence of the external cause: Secondary | ICD-10-CM | POA: Diagnosis not present

## 2012-08-06 DIAGNOSIS — N289 Disorder of kidney and ureter, unspecified: Secondary | ICD-10-CM

## 2012-08-06 DIAGNOSIS — I213 ST elevation (STEMI) myocardial infarction of unspecified site: Secondary | ICD-10-CM

## 2012-08-06 HISTORY — DX: Thrombocytopenia, unspecified: D69.6

## 2012-08-06 HISTORY — PX: LEFT HEART CATHETERIZATION WITH CORONARY ANGIOGRAM: SHX5451

## 2012-08-06 LAB — CK TOTAL AND CKMB (NOT AT ARMC)
CK, MB: 58 ng/mL (ref 0.3–4.0)
Relative Index: 9.7 — ABNORMAL HIGH (ref 0.0–2.5)
Total CK: 595 U/L — ABNORMAL HIGH (ref 7–232)

## 2012-08-06 LAB — POCT I-STAT, CHEM 8
BUN: 32 mg/dL — ABNORMAL HIGH (ref 6–23)
Chloride: 102 mEq/L (ref 96–112)
Creatinine, Ser: 1.9 mg/dL — ABNORMAL HIGH (ref 0.50–1.35)
Glucose, Bld: 259 mg/dL — ABNORMAL HIGH (ref 70–99)
Hemoglobin: 17.3 g/dL — ABNORMAL HIGH (ref 13.0–17.0)
Potassium: 3.5 mEq/L (ref 3.5–5.1)
Sodium: 137 mEq/L (ref 135–145)

## 2012-08-06 LAB — CBC WITH DIFFERENTIAL/PLATELET
Basophils Relative: 0 % (ref 0–1)
Eosinophils Absolute: 0.1 10*3/uL (ref 0.0–0.7)
Eosinophils Relative: 1 % (ref 0–5)
Hemoglobin: 17.3 g/dL — ABNORMAL HIGH (ref 13.0–17.0)
MCH: 31.5 pg (ref 26.0–34.0)
MCHC: 36 g/dL (ref 30.0–36.0)
MCV: 87.4 fL (ref 78.0–100.0)
Monocytes Relative: 8 % (ref 3–12)
Neutrophils Relative %: 47 % (ref 43–77)

## 2012-08-06 LAB — MRSA PCR SCREENING: MRSA by PCR: NEGATIVE

## 2012-08-06 LAB — COMPREHENSIVE METABOLIC PANEL
AST: 53 U/L — ABNORMAL HIGH (ref 0–37)
Albumin: 3.2 g/dL — ABNORMAL LOW (ref 3.5–5.2)
BUN: 30 mg/dL — ABNORMAL HIGH (ref 6–23)
Chloride: 99 mEq/L (ref 96–112)
Creatinine, Ser: 1.72 mg/dL — ABNORMAL HIGH (ref 0.50–1.35)
Total Bilirubin: 0.4 mg/dL (ref 0.3–1.2)
Total Protein: 6.3 g/dL (ref 6.0–8.3)

## 2012-08-06 LAB — GLUCOSE, CAPILLARY: Glucose-Capillary: 190 mg/dL — ABNORMAL HIGH (ref 70–99)

## 2012-08-06 LAB — PROTIME-INR: INR: 0.97 (ref 0.00–1.49)

## 2012-08-06 SURGERY — LEFT HEART CATHETERIZATION WITH CORONARY ANGIOGRAM
Anesthesia: Moderate Sedation

## 2012-08-06 MED ORDER — ASPIRIN 81 MG PO CHEW
324.0000 mg | CHEWABLE_TABLET | Freq: Once | ORAL | Status: AC
  Administered 2012-08-06: 324 mg via ORAL
  Filled 2012-08-06: qty 4

## 2012-08-06 MED ORDER — INSULIN GLARGINE 100 UNIT/ML ~~LOC~~ SOLN
60.0000 [IU] | Freq: Every day | SUBCUTANEOUS | Status: DC
  Administered 2012-08-07 – 2012-08-09 (×3): 60 [IU] via SUBCUTANEOUS

## 2012-08-06 MED ORDER — ONDANSETRON HCL 4 MG/2ML IJ SOLN: 4.0000 mg | Freq: Four times a day (QID) | INTRAMUSCULAR | Status: DC | PRN

## 2012-08-06 MED ORDER — NITROGLYCERIN 0.4 MG SL SUBL
0.4000 mg | SUBLINGUAL_TABLET | SUBLINGUAL | Status: DC | PRN
  Administered 2012-08-06: 0.4 mg via SUBLINGUAL

## 2012-08-06 MED ORDER — EPTIFIBATIDE 75 MG/100ML IV SOLN
INTRAVENOUS | Status: AC
  Filled 2012-08-06: qty 100

## 2012-08-06 MED ORDER — MIDAZOLAM HCL 2 MG/2ML IJ SOLN
INTRAMUSCULAR | Status: AC
  Filled 2012-08-06: qty 2

## 2012-08-06 MED ORDER — BIVALIRUDIN 250 MG IV SOLR
INTRAVENOUS | Status: AC
  Filled 2012-08-06: qty 250

## 2012-08-06 MED ORDER — TICAGRELOR 90 MG PO TABS
90.0000 mg | ORAL_TABLET | Freq: Two times a day (BID) | ORAL | Status: DC
  Administered 2012-08-07 – 2012-08-09 (×5): 90 mg via ORAL
  Filled 2012-08-06 (×6): qty 1

## 2012-08-06 MED ORDER — EPTIFIBATIDE 75 MG/100ML IV SOLN
1.0000 ug/kg/min | INTRAVENOUS | Status: AC
  Administered 2012-08-07: 1 ug/kg/min via INTRAVENOUS
  Filled 2012-08-06 (×2): qty 100

## 2012-08-06 MED ORDER — ATORVASTATIN CALCIUM 80 MG PO TABS
80.0000 mg | ORAL_TABLET | Freq: Every day | ORAL | Status: DC
  Administered 2012-08-06 – 2012-08-08 (×3): 80 mg via ORAL
  Filled 2012-08-06 (×4): qty 1

## 2012-08-06 MED ORDER — GLIPIZIDE 10 MG PO TABS
10.0000 mg | ORAL_TABLET | Freq: Every day | ORAL | Status: DC
  Administered 2012-08-07 – 2012-08-09 (×3): 10 mg via ORAL
  Filled 2012-08-06 (×3): qty 1

## 2012-08-06 MED ORDER — METOPROLOL TARTRATE 1 MG/ML IV SOLN
INTRAVENOUS | Status: AC
  Filled 2012-08-06: qty 5

## 2012-08-06 MED ORDER — TAMSULOSIN HCL 0.4 MG PO CAPS
0.4000 mg | ORAL_CAPSULE | Freq: Every day | ORAL | Status: DC
  Administered 2012-08-07 – 2012-08-09 (×3): 0.4 mg via ORAL
  Filled 2012-08-06 (×3): qty 1

## 2012-08-06 MED ORDER — OXYCODONE-ACETAMINOPHEN 5-325 MG PO TABS
1.0000 | ORAL_TABLET | ORAL | Status: DC | PRN
  Administered 2012-08-07: 1 via ORAL
  Filled 2012-08-06: qty 1

## 2012-08-06 MED ORDER — SODIUM CHLORIDE 0.9 % IV SOLN
INTRAVENOUS | Status: AC
  Administered 2012-08-07: 75 mL/h via INTRAVENOUS

## 2012-08-06 MED ORDER — ASPIRIN 81 MG PO CHEW
81.0000 mg | CHEWABLE_TABLET | Freq: Every day | ORAL | Status: DC
  Administered 2012-08-07 – 2012-08-09 (×3): 81 mg via ORAL
  Filled 2012-08-06 (×3): qty 1

## 2012-08-06 MED ORDER — HEPARIN SODIUM (PORCINE) 5000 UNIT/ML IJ SOLN
4000.0000 [IU] | Freq: Once | INTRAMUSCULAR | Status: AC
  Administered 2012-08-06: 4000 [IU] via INTRAVENOUS
  Filled 2012-08-06: qty 1

## 2012-08-06 MED ORDER — TICAGRELOR 90 MG PO TABS
ORAL_TABLET | ORAL | Status: AC
  Filled 2012-08-06: qty 2

## 2012-08-06 MED ORDER — HEPARIN (PORCINE) IN NACL 100-0.45 UNIT/ML-% IJ SOLN
1300.0000 [IU]/h | INTRAMUSCULAR | Status: DC
  Filled 2012-08-06: qty 250

## 2012-08-06 MED ORDER — METOPROLOL TARTRATE 25 MG PO TABS
25.0000 mg | ORAL_TABLET | Freq: Two times a day (BID) | ORAL | Status: DC
  Filled 2012-08-06 (×2): qty 1

## 2012-08-06 MED ORDER — FENTANYL CITRATE 0.05 MG/ML IJ SOLN
INTRAMUSCULAR | Status: AC
  Filled 2012-08-06: qty 2

## 2012-08-06 MED ORDER — NITROGLYCERIN 0.4 MG SL SUBL: 0.4000 mg | SUBLINGUAL_TABLET | SUBLINGUAL | Status: DC | PRN

## 2012-08-06 MED ORDER — HEPARIN BOLUS VIA INFUSION: 4000.0000 [IU] | Freq: Once | INTRAVENOUS | Status: DC

## 2012-08-06 MED ORDER — MORPHINE SULFATE 2 MG/ML IJ SOLN
INTRAMUSCULAR | Status: AC
  Administered 2012-08-06: 2 mg via INTRAVASCULAR
  Filled 2012-08-06: qty 1

## 2012-08-06 MED ORDER — METOPROLOL TARTRATE 12.5 MG HALF TABLET
12.5000 mg | ORAL_TABLET | Freq: Once | ORAL | Status: AC
  Administered 2012-08-06: 12.5 mg via ORAL
  Filled 2012-08-06: qty 1

## 2012-08-06 MED ORDER — ONDANSETRON HCL 4 MG/2ML IJ SOLN
INTRAMUSCULAR | Status: AC
  Administered 2012-08-06: 4 mg
  Filled 2012-08-06: qty 2

## 2012-08-06 MED ORDER — ACETAMINOPHEN 325 MG PO TABS: 650.0000 mg | ORAL_TABLET | ORAL | Status: DC | PRN

## 2012-08-06 NOTE — H&P (Signed)
Cardiology History and Physical  [name], MD  History of Present Illness (and review of medical records): Frank Simpson is a 78 y.o. male who presents for evaluation of chest pain.  Pain began 2hrs prior to presentation.  Pain was sharp, left sided chest pain with radiation to left arm.  Pain was 10/10.  He presented to ED for further evaluation where he was found to have new ST changes in anterolateral leads.  He was given ASA/ NTG/Morphine along with Heparin bolus.  CODE STEMI team was activated.  Previous diagnostic testing for coronary artery disease includes: lexiscan myoview. Previous history of cardiac disease includes Chest Pain. Coronary artery disease risk factors include: advanced age (older than 64 for men, 18 for women), diabetes mellitus, dyslipidemia, hypertension, male gender and obesity (BMI >= 30 kg/m2). Patient denies history of CHF, coronary angioplasty, coronary artery stent and previous M.I..  Review of Systems Further review of systems was otherwise negative other than stated in HPI.  Patient Active Problem List   Diagnosis Date Noted  . Shoulder pain 12/01/2011  . Chest pain 11/11/2011  . Epigastric pain 11/10/2011  . Osteoarthritis   . CRI (chronic renal insufficiency)   . Urinary incontinence   . Type II or unspecified type diabetes mellitus without mention of complication, uncontrolled 07/18/2011  . Hypercholesterolemia 07/18/2011  . Benign hypertensive heart disease without heart failure 07/18/2011   Past Medical History  Diagnosis Date  . Hypertension   . Diabetes mellitus 1994    dm edu 2012  . Hyperlipidemia   . Osteoarthritis   . CRI (chronic renal insufficiency)     baseline Cr seems to be 1.7  . Urinary incontinence     s/p PTNS didn't help  . Glaucoma     and cataracts  . History of melanoma     Past Surgical History  Procedure Date  . Replacement total knee 04/2010    RIGHT KNEE  . Tonsillectomy   . Cardiovascular stress test  04/27/2010    EF 75%, nuclear stress test with normal perfusion, no ischemia  . Foot surgery     metal pin in place  . Shoulder surgery   . Finger surgery     amputated finger  . Lexiscan myoview 10/2011    negative for ischemia  . Cataract extraction 12/12, 1/13    bilateral     Allergies  Allergen Reactions  . Januvia (Sitagliptin Phosphate) Other (See Comments)    Possibly affected kidneys?  . Metformin And Related Other (See Comments)    Affected kidneys  . Penicillins Other (See Comments)    Reaction long time ago - doesn't remember    History  Substance Use Topics  . Smoking status: Never Smoker   . Smokeless tobacco: Never Used  . Alcohol Use: No    Family History  Problem Relation Age of Onset  . Stroke Mother     hemorrhage  . Diabetes Mother   . Cancer Father     lung  . Diabetes Brother      Objective: Patient Vitals for the past 8 hrs:  BP Pulse Resp SpO2  08/06/12 2026 131/91 mmHg - - 98 %  08/06/12 2015 151/90 mmHg 97  16  98 %  08/06/12 2012 149/91 mmHg - 20  98 %  08/06/12 2008 135/76 mmHg - 16  97 %  08/06/12 2000 - 96  23  99 %   General Appearance:    Alert, cooperative, no distress, elderly  appearing male  Head:    Normocephalic, without obvious abnormality, atraumatic  Eyes:     PERRL, EOMI, anicteric sclerae  Neck:   Supple, no carotid bruit or JVD  Lungs:     Clear to auscultation bilaterally, respirations unlabored  Heart:    Regular rate and rhythm, S1 and S2 normal, no murmur  Abdomen:     Soft, non-tender, normoactive bowel sounds  Extremities:   Extremities normal, atraumatic, no cyanosis or edema  Pulses:   2+ and symmetric all extremities  Skin:   no rashes or lesions  Neurologic:   No focal deficits. AAO x3   Results for orders placed during the hospital encounter of 08/06/12 (from the past 48 hour(s))  CBC WITH DIFFERENTIAL     Status: Abnormal   Collection Time   08/06/12  7:53 PM      Component Value Range Comment   WBC  7.4  4.0 - 10.5 K/uL    RBC 5.49  4.22 - 5.81 MIL/uL    Hemoglobin 17.3 (*) 13.0 - 17.0 g/dL    HCT 48.0  39.0 - 52.0 %    MCV 87.4  78.0 - 100.0 fL    MCH 31.5  26.0 - 34.0 pg    MCHC 36.0  30.0 - 36.0 g/dL    RDW 13.0  11.5 - 15.5 %    Platelets 130 (*) 150 - 400 K/uL    Neutrophils Relative 47  43 - 77 %    Neutro Abs 3.5  1.7 - 7.7 K/uL    Lymphocytes Relative 44  12 - 46 %    Lymphs Abs 3.3  0.7 - 4.0 K/uL    Monocytes Relative 8  3 - 12 %    Monocytes Absolute 0.6  0.1 - 1.0 K/uL    Eosinophils Relative 1  0 - 5 %    Eosinophils Absolute 0.1  0.0 - 0.7 K/uL    Basophils Relative 0  0 - 1 %    Basophils Absolute 0.0  0.0 - 0.1 K/uL   PROTIME-INR     Status: Normal   Collection Time   08/06/12  7:53 PM      Component Value Range Comment   Prothrombin Time 13.1  11.6 - 15.2 seconds    INR 0.97  0.00 - 1.49   POCT I-STAT TROPONIN I     Status: Normal   Collection Time   08/06/12  8:06 PM      Component Value Range Comment   Troponin i, poc 0.05  0.00 - 0.08 ng/mL    Comment 3            POCT I-STAT, CHEM 8     Status: Abnormal   Collection Time   08/06/12  8:07 PM      Component Value Range Comment   Sodium 137  135 - 145 mEq/L    Potassium 3.5  3.5 - 5.1 mEq/L    Chloride 102  96 - 112 mEq/L    BUN 32 (*) 6 - 23 mg/dL    Creatinine, Ser 1.90 (*) 0.50 - 1.35 mg/dL    Glucose, Bld 259 (*) 70 - 99 mg/dL    Calcium, Ion 1.19  1.13 - 1.30 mmol/L    TCO2 24  0 - 100 mmol/L    Hemoglobin 17.3 (*) 13.0 - 17.0 g/dL    HCT 51.0  39.0 - 52.0 %    No results found.  ECG:  Sinus rhythm  with ST elevation in leads V2-V6, significantly changed from prior  Assessment: ACS/STEMI  Plan:  1. Emergent LHC possible PCI 2. No pertinent allergies,No absolute contrainidcations to DES, CRI cr 1.9, dye sparing 3. CCU admit post procedure 4. Continuous monitoring on Telemetry. 5. Repeat ekg on admit, prn chest pain or arrythmia 6. Trend cardiac biomarkers, check lipids, hgba1c,  tsh 7. Medical management to include ASA, BB, Brilinta, Statin, NTG prn, 8. IVFs post procedure

## 2012-08-06 NOTE — Progress Notes (Signed)
ANTICOAGULATION CONSULT NOTE - Initial Consult  Pharmacy Consult:  Heparin Indication:  ACS (Code STEMI)  Allergies  Allergen Reactions  . Januvia (Sitagliptin Phosphate)     Possibly affected kidneys?  . Metformin And Related     CRI    Patient Measurements: Height = 6'0" Weight = 210 lbs Heparin Dosing Weight: 95 kg  Vital Signs:    Labs: No results found for this basename: HGB:2,HCT:3,PLT:3,APTT:3,LABPROT:3,INR:3,HEPARINUNFRC:3,CREATININE:3,CKTOTAL:3,CKMB:3,TROPONINI:3 in the last 72 hours  The CrCl is unknown because both a height and weight (above a minimum accepted value) are required for this calculation.   Medical History: Past Medical History  Diagnosis Date  . Hypertension   . Diabetes mellitus 1994    dm edu 2012  . Hyperlipidemia   . Osteoarthritis   . CRI (chronic renal insufficiency)     baseline Cr seems to be 1.7  . Urinary incontinence     s/p PTNS didn't help  . Glaucoma     and cataracts  . History of melanoma        Assessment: 65 YOM admitted with CP/Code STEMI to start anticoagulation with IV heparin.  Baseline labs pending.  Not on anticoagulants PTA per family.   Goal of Therapy:  Heparin level 0.3-0.7 units/ml Monitor platelets by anticoagulation protocol: Yes     Plan:  - Heparin 4000 units IV bolus, then - Heparin gtt at 1300 units/hr - Check 8 hr HL if not in cath - Daily HL / CBC     Mackenzy Grumbine D. Mina Marble, PharmD, BCPS Pager:  [phone] 08/06/2012, 8:07 PM

## 2012-08-06 NOTE — ED Notes (Addendum)
C/o CP, onset ~ 2 hrs ago, constant, dull, just stays there. STEMI noted on EKG, triage aborted, EDP notifed, back to room 30, protocol initiated. Pt alert, NAD, calm, interactive, skin W&D, resps e/u, speaking in clear complete sentences, rates pain 5-6/10.

## 2012-08-06 NOTE — ED Notes (Signed)
Pt to Cath Lab at this time.  Wife with pt.

## 2012-08-06 NOTE — ED Notes (Signed)
Dr. Colon Flattery at bedside.

## 2012-08-06 NOTE — CV Procedure (Signed)
Cardiac Catheterization Operative Report  SCHNEIDER WARCHOL [id number] 9/9/20139:30 PM [name], MD  Procedure Performed:  1. Left Heart Catheterization 2. Selective Coronary Angiography 3. PTCA/DES x 1 mid LAD  Operator: Lauree Chandler, MD  Indication:  Acute anterior STEMI. Pain began 2 hours before presentation to ED.   Arrival in cath lab at 8:35 pm. First balloon inflation at 8:50pm.                                      Procedure Details: The risks, benefits, complications, treatment options, and expected outcomes were discussed with the patient and his wife in the ED. Emergency consent was obtained. The patient and/or family concurred with the proposed plan, giving verbal informed consent. The patient was brought to the cath lab from the ED. The patient was further sedated with Versed and Fentanyl. The right groin was prepped and draped in the usual manner. Using the modified Seldinger access technique, a 6 French sheath was placed in the right femoral artery. A JR-4 diagnostic catheter was used to engage and visualize the RCA. I then engaged the left main with a XB LAD 3.5 guiding catheter. He was given 180 mg of Brilinta po x 1. He was given a bolus of Angiomax and a drip was started. I then advanced a Whisper wire down the LAD. Reperfusion was established with the wire. A 2.5 x 15 mm balloon was used to dilate the stenosis in the mid LAD. I then deployed a 2.75 x 38 mm Promus Element DES in the mid LAD. The stent was post-dilated with a 2.75 x 20 mm Enetai balloon. I then used a 3.0 x 12 mm [place] balloon to post-dilate the proximal and mid segment of the stent. There was an excellent result in the mid vessel. Following NTG administation IC, there appeared to be a small filling defect distally consistent with thrombus. I elected to give him one IV bolus of Integrilin and an Integrilin drip was started. The wire and guide were removed.  A pigtail catheter was used to measure left  ventricular pressures.    There were no immediate complications. The patient was taken to the recovery area in stable condition.   Hemodynamic Findings: Central aortic pressure: 115/67 Left ventricular pressure: 113/4/9  Angiographic Findings:  Left main: No obstructive disease noted.   Left Anterior Descending Artery: Large caliber vessel that courses to the apex and gives off a large septal perforating branch and several small diagonal branches. The mid vessel is totally occluded just beyond the septal perforator.   Circumflex Artery: Small to moderate sized vessel. There is an intermediate branch and an obtuse marginal branch. The intermediate branch has 30% proximal stenosis. The obtuse marginal branch has a proximal 30% stenosis.   Right Coronary Artery: Large, dominant vessel with mild plaque in the proximal and mid segment.   Left Ventricular Angiogram: Deferred.   Impression: 1. Acute anterior STEMI secondary to occluded mid LAD 2. Successful PTCA/DES x 1 mid LAD 3. Mild non-obstructive disease proximal Circumflex and proximal Ramus Intermediate branch.   Recommendations: Will admit to CCU. Will run Integrilin gtt at renal dosing for 18 hours given residual distal thrombus. Angiomax stopped in the cath lab. Will start beta blocker, statin. Continue ASA and Brilinta. Will hydrate gently overnight with history of renal insufficiency. Will check echo in am.        Complications:  None. The patient tolerated the procedure well.

## 2012-08-06 NOTE — ED Notes (Signed)
Paged [place] for CODE STEMI

## 2012-08-06 NOTE — ED Notes (Signed)
Pt was placed on zoll monitor when he entered room

## 2012-08-06 NOTE — Progress Notes (Signed)
MEDICATION RELATED CONSULT NOTE - INITIAL   Pharmacy Consult for integrelin Indication: s/p PTCA  Allergies  Allergen Reactions  . Januvia (Sitagliptin Phosphate) Other (See Comments)    Possibly affected kidneys?  . Metformin And Related Other (See Comments)    Affected kidneys  . Penicillins Other (See Comments)    Reaction long time ago - doesn't remember    Patient Measurements: Height: 6' 0.05" (183 cm) Weight: 213 lb 13.5 oz (97 kg) IBW/kg (Calculated) : 77.71    Vital Signs: BP: 131/91 mmHg (09/09 2026) Pulse Rate: 81  (09/09 2040) Intake/Output from previous day:   Intake/Output from this shift:    Labs:  [facility] 08/06/12 2007 08/06/12 1953  WBC -- 7.4  HGB 17.3* 17.3*  HCT 51.0 48.0  PLT -- 130*  APTT -- --  CREATININE 1.90* --  LABCREA -- --  CREATININE 1.90* --  CREAT24HRUR -- --  MG -- --  PHOS -- --  ALBUMIN -- --  PROT -- --  ALBUMIN -- --  AST -- --  ALT -- --  ALKPHOS -- --  BILITOT -- --  BILIDIR -- --  IBILI -- --   Estimated Creatinine Clearance: 38.1 ml/min (by C-G formula based on Cr of 1.9).   Microbiology: No results found for this or any previous visit (from the past 720 hour(s)).  Medical History: Past Medical History  Diagnosis Date  . Hypertension   . Diabetes mellitus 1994    dm edu 2012  . Hyperlipidemia   . Osteoarthritis   . CRI (chronic renal insufficiency)     baseline Cr seems to be 1.7  . Urinary incontinence     s/p PTNS didn't help  . Glaucoma     and cataracts  . History of melanoma     Medications:  Prescriptions prior to admission  Medication Sig Dispense Refill  . amLODipine (NORVASC) 5 MG tablet Take 5 mg by mouth daily.      [name] Kitchen glipiZIDE (GLUCOTROL) 10 MG tablet Take 10 mg by mouth daily.      [name] Kitchen ibuprofen (ADVIL,MOTRIN) 200 MG tablet Take 400 mg by mouth 3 times/day as needed-between meals & bedtime. For pain      . insulin glargine (LANTUS) 100 UNIT/ML injection Inject 60 Units into the skin  every morning. Qs 1 month  30 mL  3  . insulin lispro (HUMALOG) 100 UNIT/ML injection Inject 5-15 Units into the skin 3 (three) times daily before meals. Sliding scale:  150-200 2 units, 201-250 4 units, 251-300 6 units, 301-350 8 units, >350 10 units, >450 15 units      . nitroGLYCERIN (NITROSTAT) 0.4 MG SL tablet Place 1 tablet (0.4 mg total) under the tongue every 5 (five) minutes as needed for chest pain. May take up to three doses,if pain continues call 911  25 tablet  3  . Tamsulosin HCl (FLOMAX) 0.4 MG CAPS Take 1 capsule (0.4 mg total) by mouth daily.  90 capsule  3    Assessment: 78 yo s/p PTCA for acute STEMI to receive integrelin 18 hours post-cath.    Goal of Therapy:  Adequate antiplatelet therapy  Plan:  Integrelin at 1 mcg/kg/min (renal dosing per MD request) for 18 hours. F/u am CBC.  Laura Radilla Poteet 08/06/2012,10:17 PM

## 2012-08-06 NOTE — ED Provider Notes (Signed)
History     CSN: [id number]  Arrival date & time 08/06/12  9678   First MD Initiated Contact with Patient 08/06/12 1948      Chief Complaint  Patient presents with  . Chest Pain    (Consider location/radiation/quality/duration/timing/severity/associated sxs/prior treatment) Patient is a 78 y.o. male presenting with chest pain. The history is provided by the patient and medical records. No language interpreter was used.  Chest Pain The chest pain began 1 - 2 hours ago. Chest pain occurs constantly. The chest pain is unchanged. At its most intense, the pain is at 9/10. The pain is currently at 6/10. The severity of the pain is moderate. The quality of the pain is described as heavy. The pain radiates to the left arm. Pertinent negatives for primary symptoms include no shortness of breath, no nausea and no vomiting.  Pertinent negatives for associated symptoms include no diaphoresis and no lower extremity edema. He tried nothing for the symptoms. Risk factors include being elderly.  His past medical history is significant for diabetes, hyperlipidemia and hypertension.     Past Medical History  Diagnosis Date  . Hypertension   . Diabetes mellitus 1994    dm edu 2012  . Hyperlipidemia   . Osteoarthritis   . CRI (chronic renal insufficiency)     baseline Cr seems to be 1.7  . Urinary incontinence     s/p PTNS didn't help  . Glaucoma     and cataracts  . History of melanoma     Past Surgical History  Procedure Date  . Replacement total knee 04/2010    RIGHT KNEE  . Tonsillectomy   . Cardiovascular stress test 04/27/2010    EF 75%, nuclear stress test with normal perfusion, no ischemia  . Foot surgery     metal pin in place  . Shoulder surgery   . Finger surgery     amputated finger  . Lexiscan myoview 10/2011    negative for ischemia  . Cataract extraction 12/12, 1/13    bilateral    Family History  Problem Relation Age of Onset  . Stroke Mother     hemorrhage  .  Diabetes Mother   . Cancer Father     lung  . Diabetes Brother     History  Substance Use Topics  . Smoking status: Never Smoker   . Smokeless tobacco: Never Used  . Alcohol Use: No      Review of Systems  Constitutional: Negative for diaphoresis.  Respiratory: Negative for shortness of breath.   Cardiovascular: Positive for chest pain.  Gastrointestinal: Negative for nausea and vomiting.  All other systems reviewed and are negative.    Allergies  Januvia and Metformin and related  Home Medications   Current Outpatient Rx  Name Route Sig Dispense Refill  . AMLODIPINE BESYLATE 5 MG PO TABS Oral Take 1 tablet (5 mg total) by mouth daily. 90 tablet 3  . ASPIRIN 325 MG PO TABS Oral Take 325 mg by mouth daily. OCCASSIONALLY     . GLIPIZIDE 10 MG PO TABS Oral Take 0.5 tablets (5 mg total) by mouth daily.    . INSULIN GLARGINE 100 UNIT/ML [place] SOLN Subcutaneous Inject 60 Units into the skin every morning. Qs 1 month 30 mL 3  . INSULIN LISPRO (HUMAN) 100 UNIT/ML [place] SOLN  Sliding scale: 150-200 =2units, 201-250 =4units, 251-300 =6units, 301-350 =8units, >350 =10units 30 mL 3  . INSULIN SYRINGE 29G X 1/2" 0.5 ML MISC  Use  as directed 100 each 11  . NITROGLYCERIN 0.4 MG SL SUBL Sublingual Place 1 tablet (0.4 mg total) under the tongue every 5 (five) minutes as needed for chest pain. May take up to three doses,if pain continues call 911 25 tablet 3  . ROSUVASTATIN CALCIUM 10 MG PO TABS Oral Take 1 tablet (10 mg total) by mouth at bedtime. 90 tablet 3  . VESICARE PO Oral Take 10 mg by mouth daily as needed.     [name] Kitchen TAMSULOSIN HCL 0.4 MG PO CAPS Oral Take 1 capsule (0.4 mg total) by mouth daily. 90 capsule 3  . TRIAMTERENE-HCTZ 37.5-25 MG PO TABS Oral Take 1 tablet by mouth daily. 90 tablet 3    There were no vitals taken for this visit.  Physical Exam  Nursing note and vitals reviewed. Constitutional: He is oriented to person, place, and time. He appears well-developed and  well-nourished. No distress.  HENT:  Head: Normocephalic and atraumatic.  Right Ear: External ear normal.  Left Ear: External ear normal.  Nose: Nose normal.  Mouth/Throat: Oropharynx is clear and moist.  Eyes: Conjunctivae and EOM are normal. Pupils are equal, round, and reactive to light.  Neck: Normal range of motion. Neck supple.  Cardiovascular: Normal rate, regular rhythm, normal heart sounds and intact distal pulses.  Exam reveals no gallop and no friction rub.   No murmur heard. Pulmonary/Chest: Effort normal and breath sounds normal.  Abdominal: Soft.  Musculoskeletal: Normal range of motion. He exhibits no edema and no tenderness.  Neurological: He is alert and oriented to person, place, and time. He has normal reflexes.  Skin: Skin is warm and dry.  Psychiatric: He has a normal mood and affect.    ED Course  Procedures (including critical care time)   Labs Reviewed  CBC WITH DIFFERENTIAL  PROTIME-INR   No results found.    Date: 08/06/2012  Rate: 89   Rhythm: normal sinus rhythm and premature atrial contractions (PAC)  QRS Axis: left  Intervals: normal  ST/T Wave abnormalities: ST elevations anteriorly  Conduction Disutrbances:none  Narrative Interpretation:   Old EKG Reviewed: none available    1. Chest pain   2. STEMI (ST elevation myocardial infarction)       MDM    Patient is a 78 year old male, who presents to the emergency department with complaints of left-sided chest pain, radiating into his left shoulder. EKG done in triage showed ST elevations in anterior leads. Code STEMI was activated, the patient was given aspirin, NTG, and heparin. Bedside evaluation by cardiology, completed, and patient felt to warrant trip to Cath Lab for intervention. Prior to departure, patient noted chest pain, had decreased from 10 out of 10 to 3/10. He was moved to Cath Lab without events.        Corlis Leak, MD 08/06/12 [phone]

## 2012-08-06 NOTE — Progress Notes (Signed)
CRITICAL VALUE ALERT  Critical value received:  Troponin=15.18 and ckmb = 58  Date of notification:  08/06/12  Time of notification:2249 Critical value read back:yes Nurse who received alert: Earnest Bailey, RN MD notified (1st page): MD not notified, s/p cardiac cath Time of first page:   MD notified (2nd page):  Time of second page:  Responding MD:   Time MD responded:

## 2012-08-07 ENCOUNTER — Encounter: Payer: Self-pay | Admitting: Family Medicine

## 2012-08-07 DIAGNOSIS — E7800 Pure hypercholesterolemia, unspecified: Secondary | ICD-10-CM | POA: Diagnosis not present

## 2012-08-07 DIAGNOSIS — I219 Acute myocardial infarction, unspecified: Secondary | ICD-10-CM

## 2012-08-07 DIAGNOSIS — R079 Chest pain, unspecified: Secondary | ICD-10-CM | POA: Diagnosis not present

## 2012-08-07 DIAGNOSIS — N189 Chronic kidney disease, unspecified: Secondary | ICD-10-CM

## 2012-08-07 DIAGNOSIS — I369 Nonrheumatic tricuspid valve disorder, unspecified: Secondary | ICD-10-CM | POA: Diagnosis not present

## 2012-08-07 LAB — BASIC METABOLIC PANEL
BUN: 28 mg/dL — ABNORMAL HIGH (ref 6–23)
CO2: 27 mEq/L (ref 19–32)
Chloride: 101 mEq/L (ref 96–112)
Glucose, Bld: 209 mg/dL — ABNORMAL HIGH (ref 70–99)
Potassium: 3.6 mEq/L (ref 3.5–5.1)
Sodium: 137 mEq/L (ref 135–145)

## 2012-08-07 LAB — LIPID PANEL
HDL: 28 mg/dL — ABNORMAL LOW (ref >39)
LDL Cholesterol: 92 mg/dL (ref 0–99)
Total CHOL/HDL Ratio: 6.3 RATIO
Triglycerides: 278 mg/dL — ABNORMAL HIGH (ref <150)
VLDL: 56 mg/dL — ABNORMAL HIGH (ref 0–40)

## 2012-08-07 LAB — POCT ACTIVATED CLOTTING TIME
Activated Clotting Time: 204 seconds
Activated Clotting Time: 259 seconds
Activated Clotting Time: 209 seconds

## 2012-08-07 LAB — CBC
HCT: 41 % (ref 39.0–52.0)
Hemoglobin: 14.7 g/dL (ref 13.0–17.0)
MCH: 31.3 pg (ref 26.0–34.0)
MCV: 87.2 fL (ref 78.0–100.0)
RBC: 4.7 MIL/uL (ref 4.22–5.81)

## 2012-08-07 LAB — CK TOTAL AND CKMB (NOT AT ARMC): Total CK: 1109 U/L — ABNORMAL HIGH (ref 7–232)

## 2012-08-07 LAB — TROPONIN I: Troponin I: >20 ng/mL (ref <0.30)

## 2012-08-07 LAB — GLUCOSE, CAPILLARY: Glucose-Capillary: 313 mg/dL — ABNORMAL HIGH (ref 70–99)

## 2012-08-07 MED ORDER — INSULIN ASPART 100 UNIT/ML ~~LOC~~ SOLN
0.0000 [IU] | Freq: Three times a day (TID) | SUBCUTANEOUS | Status: DC
  Administered 2012-08-07 (×2): 7 [IU] via SUBCUTANEOUS
  Administered 2012-08-08 (×2): 1 [IU] via SUBCUTANEOUS
  Administered 2012-08-09: 3 [IU] via SUBCUTANEOUS
  Administered 2012-08-09: 1 [IU] via SUBCUTANEOUS

## 2012-08-07 MED ORDER — POTASSIUM CHLORIDE CRYS ER 20 MEQ PO TBCR
40.0000 meq | EXTENDED_RELEASE_TABLET | Freq: Once | ORAL | Status: AC
  Administered 2012-08-07: 40 meq via ORAL
  Filled 2012-08-07: qty 2

## 2012-08-07 MED ORDER — SODIUM CHLORIDE 0.9 % IV SOLN: INTRAVENOUS | Status: DC

## 2012-08-07 MED ORDER — SODIUM CHLORIDE 0.9 % IJ SOLN
3.0000 mL | Freq: Two times a day (BID) | INTRAMUSCULAR | Status: DC
  Administered 2012-08-07 – 2012-08-08 (×3): 3 mL via INTRAVENOUS

## 2012-08-07 MED FILL — Dextrose Inj 5%: INTRAVENOUS | Qty: 1000 | Status: AC

## 2012-08-07 NOTE — Progress Notes (Signed)
  Echocardiogram 2D Echocardiogram has been performed.  Frank Simpson 08/07/2012, 11:22 AM

## 2012-08-07 NOTE — Care Management Note (Addendum)
Page 1 of 1   08/07/2012     2:19:32 PM   CARE MANAGEMENT NOTE 08/07/2012  Patient:  Frank Simpson   Account Number:  [id number]  Date Initiated:  08/07/2012  Documentation initiated by:  Elissa Hefty  Subjective/Objective Assessment:   adm w mi     Action/Plan:   lives w wife,pc dr [name]   Anticipated DC Date:     Anticipated DC Plan:        DC Planning Services  CM consult      Choice offered to / List presented to:             Status of service:   Medicare Important Message given?   (If response is "NO", the following Medicare IM given date fields will be blank) Date Medicare IM given:   Date Additional Medicare IM given:    Discharge Disposition:    Per UR Regulation:  Reviewed for med. necessity/level of care/duration of stay  If discussed at [place] of Stay Meetings, dates discussed:    Comments:  9/10 9:24a debbie Joyell Emami rn,bsn [phone] gave pt brilinta card for 30days free and card that may assist w copay. pt gets meds at [facility] on [address] [phone] and they have in stock. pt states has ins for meds. cm sec unable to find exact copay amt.

## 2012-08-07 NOTE — Progress Notes (Signed)
CARDIAC REHAB PHASE I   PRE:  Rate/Rhythm: 64 SR  BP:  Supine:   Sitting: 128/59  Standing:    SaO2: 97 2L  MODE:  Ambulation: 350 ft   POST:  Rate/Rhythem: 72 SR  BP:  Supine:   Sitting: 139/65  Standing:    SaO2: 98 RA 0940-1040 Tolerated ambulation well without c/o of cp or SOB. VS stable. Started MI education with pt and wife. Pt very resistant to taking medications and lifestyle changes. He seems to understand the reasons for the medications, but still does not like to take them. Discussed Outpt. CRP and left pt diabetic diet sheet.Will follow pt tomorrow to continue education.  Deon Pilling

## 2012-08-07 NOTE — Progress Notes (Signed)
Pre and Post sheath removal instructions given to pt, pt vu. Hematoma noted to site once drsg was removed. Manual press held to site and hematoma mashed for 95mn. Site soft but bruised. Pressure drsg applied. Palpable pulses, pt aware to keep rt leg straight for 4h. Vss and pt remained calm through out sheath pull. Will cont to monitor closely.

## 2012-08-07 NOTE — Progress Notes (Addendum)
SUBJECTIVE: No chest pain this am. One episode of chest pain at 4 am, mild and responsive to Percocet. No SOB this am.   Tele: sinus brady, rate 55 bpm Tele review with 2 short 5 beat runs of NSVT  BP 101/55  Pulse 46  Temp 97.8 F (36.6 C) (Oral)  Resp 13  Ht 6' (1.829 m)  Wt 208 lb 1.8 oz (94.4 kg)  BMI 28.23 kg/m2  SpO2 99%  Intake/Output Summary (Last 24 hours) at 08/07/12 4270 Last data filed at 08/07/12 0700  Gross per 24 hour  Intake  589.2 ml  Output    800 ml  Net -210.8 ml    PHYSICAL EXAM General: Well developed, well nourished, in no acute distress. Alert and oriented x 3.  Psych:  Good affect, responds appropriately Neck: No JVD. No masses noted.  Lungs: Clear bilaterally with no wheezes or rhonci noted.  Heart: Bradycardic, regular with no murmurs noted. Abdomen: Bowel sounds are present. Soft, non-tender.  Extremities: No lower extremity edema. Right groin soft without hematoma.  LABS: Basic Metabolic Panel:  Basename 08/07/12 0335 08/06/12 2155  NA 137 135  K 3.6 3.8  CL 101 99  CO2 27 24  GLUCOSE 209* 226*  BUN 28* 30*  CREATININE 1.72* 1.72*  CALCIUM 8.8 8.8  MG -- --  PHOS -- --   CBC:  Basename 08/07/12 0335 08/06/12 2007 08/06/12 1953  WBC 7.5 -- 7.4  NEUTROABS -- -- 3.5  HGB 14.7 17.3* --  HCT 41.0 51.0 --  MCV 87.2 -- 87.4  PLT 121* -- 130*   Cardiac Enzymes:  Basename 08/07/12 0335 08/06/12 2155  CKTOTAL 1109* 595*  CKMB 132.9* 58.0*  CKMBINDEX -- --  TROPONINI >20.00* 15.18*   Fasting Lipid Panel:  Basename 08/07/12 0335  CHOL 176  HDL 28*  LDLCALC 92  TRIG 278*  CHOLHDL 6.3  LDLDIRECT --    Current Meds:    . aspirin  324 mg Oral Once  . aspirin  81 mg Oral Daily  . atorvastatin  80 mg Oral q1800  . bivalirudin      . eptifibatide      . fentaNYL      . glipiZIDE  10 mg Oral Daily  . heparin  4,000 Units Intravenous Once  . insulin glargine  60 Units Subcutaneous Daily  . metoprolol tartrate  12.5  mg Oral Once  . metoprolol tartrate  25 mg Oral BID  . midazolam      . morphine      . ondansetron      . Tamsulosin HCl  0.4 mg Oral Daily  . Ticagrelor      . Ticagrelor  90 mg Oral BID  . DISCONTD: heparin  4,000 Units Intravenous Once   EKG: sinus brady, first degree AV block. LAFB. Poor R wave progression precordial leads with diffuse T wave changes.   ASSESSMENT AND PLAN:  1. CAD/Acute anterior STEMI: Pt admitted last night with STEMI. Now s/p DES x 1 mid LAD. He has been managed overnight with Integrilin gtt given thrombus burden in LAD. He is doing well. Will continue ASA/Brilinta/statin. Will hold beta blocker with bradycardia. Cardiac rehab today. CCU today.  Echo today to assess LVEF (not done during cath given his renal insufficiency)  2. Chronic kidney disease, stage 3: Stable post cath. Will follow renal function over next 48 hours.   3. Diabetes mellitus: Continue current regimen. Add SSI.  4. HTN: BP controlled,  stable.   5. Hyperlipidemia: On statin  6. Hypokalemia: Replace potassium this am.   Frank Simpson,Frank Simpson  9/10/20137:28 AM

## 2012-08-08 ENCOUNTER — Encounter (HOSPITAL_COMMUNITY): Payer: Self-pay | Admitting: Physician Assistant

## 2012-08-08 DIAGNOSIS — N189 Chronic kidney disease, unspecified: Secondary | ICD-10-CM | POA: Diagnosis not present

## 2012-08-08 DIAGNOSIS — D696 Thrombocytopenia, unspecified: Secondary | ICD-10-CM | POA: Diagnosis present

## 2012-08-08 DIAGNOSIS — R079 Chest pain, unspecified: Secondary | ICD-10-CM | POA: Diagnosis not present

## 2012-08-08 DIAGNOSIS — I219 Acute myocardial infarction, unspecified: Secondary | ICD-10-CM | POA: Diagnosis not present

## 2012-08-08 LAB — GLUCOSE, CAPILLARY
Glucose-Capillary: 114 mg/dL — ABNORMAL HIGH (ref 70–99)
Glucose-Capillary: 124 mg/dL — ABNORMAL HIGH (ref 70–99)

## 2012-08-08 LAB — BASIC METABOLIC PANEL
BUN: 28 mg/dL — ABNORMAL HIGH (ref 6–23)
CO2: 28 mEq/L (ref 19–32)
Calcium: 9.2 mg/dL (ref 8.4–10.5)
Chloride: 102 mEq/L (ref 96–112)
Creatinine, Ser: 1.93 mg/dL — ABNORMAL HIGH (ref 0.50–1.35)
GFR calc Af Amer: 36 mL/min — ABNORMAL LOW (ref >90)
Glucose, Bld: 113 mg/dL — ABNORMAL HIGH (ref 70–99)

## 2012-08-08 LAB — CBC
Hemoglobin: 14.9 g/dL (ref 13.0–17.0)
MCHC: 35.1 g/dL (ref 30.0–36.0)
Platelets: 126 10*3/uL — ABNORMAL LOW (ref 150–400)
RBC: 4.83 MIL/uL (ref 4.22–5.81)

## 2012-08-08 NOTE — Progress Notes (Signed)
CARDIAC REHAB PHASE I   PRE:  Rate/Rhythm: 10 SR    BP: sitting 145/71    SaO2:   MODE:  Ambulation: 700 ft   POST:  Rate/Rhythm: 94 SR with PACs/PVCs after walk    BP: sitting 153/74     SaO2:   Tolerated well, pt sts he feels great. Slightly unsteady due to bad knee but able to walk independently. Pt does admit to multiple falls a few mos ago. Wife sts it was due to dizziness. Pt did not seek medical attention. Warned pt of falling on antiplatelet. Ed completed and pt requests his name be sent to [place]. Pt does not seem very receptive to ed. [place], [place], ACSM

## 2012-08-08 NOTE — Progress Notes (Signed)
Patient Name: Frank Simpson Date of Encounter: 08/08/2012  Active Problems:  STEMI (ST elevation myocardial infarction)    SUBJECTIVE: Denies chest pain or SOB, wants d/c, but accepts staying another day.  OBJECTIVE Filed Vitals:   08/08/12 0400 08/08/12 0500 08/08/12 0600 08/08/12 0700  BP: 120/72 107/63 136/78 129/84  Pulse: 58 59 58 60  Temp: 98 F (36.7 C)     TempSrc: Oral     Resp: 16     Height:      Weight:      SpO2: 96% 97% 97% 98%    Intake/Output Summary (Last 24 hours) at 08/08/12 0858 Last data filed at 08/08/12 0800  Gross per 24 hour  Intake 1095.4 ml  Output   2076 ml  Net -980.6 ml   Filed Weights   08/06/12 2026 08/06/12 2218  Weight: 213 lb 13.5 oz (97 kg) 208 lb 1.8 oz (94.4 kg)   PHYSICAL EXAM General: Well developed, well nourished, male in no acute distress. Head: Normocephalic, atraumatic.  Neck: Supple without bruits, JVD not elevated. Lungs:  Resp regular and unlabored, few rales bases. Heart: RRR, S1, S2, no S3, or murmur.  Had S4, noted at LLSB Abdomen: Soft, non-tender, non-distended, BS + x 4.  Extremities: No clubbing, cyanosis, no edema.  Neuro: Alert and oriented X 3. Moves all extremities spontaneously. Psych: Normal affect.  LABS: CBC: Basename 08/08/12 0528 08/07/12 0335 08/06/12 1953  WBC 8.1 7.5 --  NEUTROABS -- -- 3.5  HGB 14.9 14.7 --  HCT 42.4 41.0 --  MCV 87.8 87.2 --  PLT 126* 121* --   INR: Basename 08/06/12 1953  INR 9.52   Basic Metabolic Panel: Basename 84/13/24 0528 08/07/12 0335  NA 139 137  K 3.7 3.6  CL 102 101  CO2 28 27  GLUCOSE 113* 209*  BUN 28* 28*  CREATININE 1.93* 1.72*  CALCIUM 9.2 8.8  MG -- --  PHOS -- --   Liver Function Tests:  [facility] 08/06/12 2155  AST 53*  ALT 15  ALKPHOS 56  BILITOT 0.4  PROT 6.3  ALBUMIN 3.2*   Cardiac Enzymes: Basename 08/07/12 0937 08/07/12 0335 08/06/12 2155  CKTOTAL 832* 1109* 595*  CKMB 100.6* 132.9* 58.0*  CKMBINDEX -- -- --    TROPONINI >20.00* >20.00* 15.18*   Basename 08/06/12 2006  TROPIPOC 0.05   Fasting Lipid Panel: Basename 08/07/12 0335  CHOL 176  HDL 28*  LDLCALC 92  TRIG 278*  CHOLHDL 6.3  LDLDIRECT --   TELE: NSR with PVC's. Pause up to 2.1 seconds secondary to a non-conducted sinus beat.  ECHO: 08/07/2012 Study Conclusions - Left ventricle: Septal and apical hypokinesis The cavity size was normal. Wall thickness was increased in a pattern of moderate LVH. The estimated ejection fraction was 45%. - Atrial septum: No defect or patent foramen ovale was identified.  ECG: 08/08/2012 7:02AM - Sinus brady with 1st degree heart block and PACs  Current Medications:    . aspirin  81 mg Oral Daily  . atorvastatin  80 mg Oral q1800  . glipiZIDE  10 mg Oral Daily  . insulin aspart  0-9 Units Subcutaneous TID WC  . insulin glargine  60 Units Subcutaneous Daily  . sodium chloride  3 mL Intravenous Q12H  . Tamsulosin HCl  0.4 mg Oral Daily  . Ticagrelor  90 mg Oral BID      . sodium chloride 10 mL/hr (08/07/12 0606)  . eptifibatide Stopped (08/07/12 1600)  ASSESSMENT AND PLAN: 1. CAD/Acute anterior STEMI: Pt admitted 9/9 with STEMI. Now s/p DES x 1 mid LAD. Integrilin gtt given because of thrombus burden in LAD, now off. He is doing well. Will continue ASA/Brilinta/statin. Will hold beta blocker with bradycardia. Cardiac rehab today. Transfer to tele. Echo resulted above (not done during cath given his renal insufficiency), No ACE given his renal insufficiency.  2. Chronic kidney disease, stage 3: Slightly increased post cath. Continue to follow renal function.   3. Diabetes mellitus: Continue current regimen. On SSI.   4. HTN: BP controlled, stable.   5. Hyperlipidemia: On statin  - f/u in office  6. Hypokalemia: Replaced potassium and has resolved.   7. Thrombocytopenia: Has had in the past, no Sx, cont to follow.  Plan: Possible d/c in am if does well. Signed, Rosaria Ferries ,  PA-C 8:58 AM 08/08/2012  I have personally seen and examined this patient with Rosaria Ferries, PA-C.  I agree with the assessment and plan as outlined above. He is doing well post anterior STEMI with occluded LAD, DES x 1 mid LAD. LVEF 45%. BP stable. No chest pain. Groin stable. To telemetry. Home tomorrow if stable.   [place] Kuang 11:45 AM 08/08/2012

## 2012-08-09 DIAGNOSIS — I219 Acute myocardial infarction, unspecified: Secondary | ICD-10-CM | POA: Diagnosis not present

## 2012-08-09 DIAGNOSIS — N189 Chronic kidney disease, unspecified: Secondary | ICD-10-CM | POA: Diagnosis not present

## 2012-08-09 DIAGNOSIS — E7800 Pure hypercholesterolemia, unspecified: Secondary | ICD-10-CM | POA: Diagnosis not present

## 2012-08-09 LAB — GLUCOSE, CAPILLARY
Glucose-Capillary: 147 mg/dL — ABNORMAL HIGH (ref 70–99)
Glucose-Capillary: 235 mg/dL — ABNORMAL HIGH (ref 70–99)

## 2012-08-09 LAB — BASIC METABOLIC PANEL
BUN: 25 mg/dL — ABNORMAL HIGH (ref 6–23)
Chloride: 103 mEq/L (ref 96–112)
GFR calc Af Amer: 41 mL/min — ABNORMAL LOW (ref >90)
Glucose, Bld: 104 mg/dL — ABNORMAL HIGH (ref 70–99)
Sodium: 140 mEq/L (ref 135–145)

## 2012-08-09 MED ORDER — ROSUVASTATIN CALCIUM 40 MG PO TABS: 40.0000 mg | ORAL_TABLET | Freq: Every day | ORAL | Status: DC

## 2012-08-09 MED ORDER — TICAGRELOR 90 MG PO TABS: 90.0000 mg | ORAL_TABLET | Freq: Two times a day (BID) | ORAL | Status: DC

## 2012-08-09 MED ORDER — ASPIRIN 81 MG PO TABS: 81.0000 mg | ORAL_TABLET | Freq: Every day | ORAL | Status: DC

## 2012-08-09 MED ORDER — IBUPROFEN 200 MG PO TABS: 400.0000 mg | ORAL_TABLET | Freq: Two times a day (BID) | ORAL | Status: DC | PRN

## 2012-08-09 MED ORDER — NITROGLYCERIN 0.4 MG SL SUBL: 0.4000 mg | SUBLINGUAL_TABLET | SUBLINGUAL | Status: DC | PRN

## 2012-08-09 NOTE — Progress Notes (Signed)
SUBJECTIVE: No complaints this am. No chest pain or SOB.   BP 153/84  Pulse 63  Temp 97.7 F (36.5 C) (Oral)  Resp 18  Ht 6' (1.829 m)  Wt 208 lb 1.8 oz (94.4 kg)  BMI 28.23 kg/m2  SpO2 98%  Intake/Output Summary (Last 24 hours) at 08/09/12 0728 Last data filed at 08/08/12 1200  Gross per 24 hour  Intake    600 ml  Output    600 ml  Net      0 ml    PHYSICAL EXAM General: Well developed, well nourished, in no acute distress. Alert and oriented x 3.  Psych:  Good affect, responds appropriately Neck: No JVD. No masses noted.  Lungs: Clear bilaterally with no wheezes or rhonci noted.  Heart: RRR with no murmurs noted. Abdomen: Bowel sounds are present. Soft, non-tender.  Extremities: No lower extremity edema. Right groin soft. No hematoma.   LABS: Basic Metabolic Panel:  Basename 08/09/12 0455 08/08/12 0528  NA 140 139  K 3.9 3.7  CL 103 102  CO2 29 28  GLUCOSE 104* 113*  BUN 25* 28*  CREATININE 1.73* 1.93*  CALCIUM 9.6 9.2  MG -- --  PHOS -- --   CBC:  Basename 08/08/12 0528 08/07/12 0335 08/06/12 1953  WBC 8.1 7.5 --  NEUTROABS -- -- 3.5  HGB 14.9 14.7 --  HCT 42.4 41.0 --  MCV 87.8 87.2 --  PLT 126* 121* --   Cardiac Enzymes:  Basename 08/07/12 0937 08/07/12 0335 08/06/12 2155  CKTOTAL 832* 1109* 595*  CKMB 100.6* 132.9* 58.0*  CKMBINDEX -- -- --  TROPONINI >20.00* >20.00* 15.18*   Fasting Lipid Panel:  Basename 08/07/12 0335  CHOL 176  HDL 28*  LDLCALC 92  TRIG 278*  CHOLHDL 6.3  LDLDIRECT --    Current Meds:    . aspirin  81 mg Oral Daily  . atorvastatin  80 mg Oral q1800  . glipiZIDE  10 mg Oral Daily  . insulin aspart  0-9 Units Subcutaneous TID WC  . insulin glargine  60 Units Subcutaneous Daily  . sodium chloride  3 mL Intravenous Q12H  . Tamsulosin HCl  0.4 mg Oral Daily  . Ticagrelor  90 mg Oral BID   ECHO: 08/07/2012  Study Conclusions - Left ventricle: Septal and apical hypokinesis The cavity size was normal. Wall  thickness was increased in a pattern of moderate LVH. The estimated ejection fraction was 45%. - Atrial septum: No defect or patent foramen ovale was identified.  ASSESSMENT AND PLAN:   1. CAD/Acute anterior STEMI: Pt admitted 08/06/12 with STEMI. Now s/p DES x 1 mid LAD.  He is doing well. Will continue ASA/Brilinta/statin. He will need 12 months of dual antiplatelet therapy. No beta blocker secondary to bradycardia.  No ACE given his renal insufficiency.   2. Ischemic cardiomyopathy: LVEF 45%. He will not be discharged on an Ace-inh/ARB because of renal insuffficiency. He will not be discharged on a beta blocker secondary to bradycardia.   3. Chronic kidney disease, stage 3: Present on admission. Stable.    4. Diabetes mellitus: Continue current regimen. On SSI.   5. HTN: BP elevated this am. Restart home dose of Norvasc at 5 mg po Qdaily. Will not restart Maxzide at this time.    6. Hyperlipidemia: On statin   7. Hypokalemia: Resolved.  8. Dispo: D/C home today. F/U with Dr. Mare Ferrari in 2-3 weeks. New med is Brilinta. He is on crestor at home.  MCALHANY,CHRISTOPHER  9/12/20137:28 AM

## 2012-08-09 NOTE — Progress Notes (Signed)
CARDIAC REHAB PHASE I   PRE:  Rate/Rhythm: 82  BP:  Supine:   Sitting: 128/60  Standing:    SaO2: 98 RA  MODE:  Ambulation: 1780 ft   POST:  Rate/Rhythem: 105  BP:  Supine:   Sitting: 140/80  Standing:    SaO2: 98 RA 0900-0925 Pt tolerated ambulation  well without c/o of cp or SOB. VS stable. Pt and wife denies any questions related to education provided. Pt to side of bed after walk, anxious to go home.  Deon Pilling

## 2012-08-09 NOTE — Discharge Instructions (Signed)
PLEASE REMEMBER TO BRING ALL OF YOUR MEDICATIONS TO EACH OF YOUR FOLLOW-UP OFFICE VISITS.  PLEASE ATTEND ALL SCHEDULED FOLLOW-UP APPOINTMENTS.   Activity: Increase activity slowly as tolerated. You may shower, but no soaking baths (or swimming) for 1 week. No driving for 1 week. No lifting over 5 lbs for 2 weeks. No sexual activity for 1 week.   You May Return to Work: in 3 weeks (if applicable)  Wound Care: You may wash cath site gently with soap and water. Keep cath site clean and dry. If you notice pain, swelling, bleeding or pus at your cath site, please call [phone].    Cardiac Cath Site Care Refer to this sheet in the next few weeks. These instructions provide you with information on caring for yourself after your procedure. Your caregiver may also give you more specific instructions. Your treatment has been planned according to current medical practices, but problems sometimes occur. Call your caregiver if you have any problems or questions after your procedure. HOME CARE INSTRUCTIONS  You may shower 24 hours after the procedure. Remove the bandage (dressing) and gently wash the site with plain soap and water. Gently pat the site dry.   Do not apply powder or lotion to the site.   Do not sit in a bathtub, swimming pool, or whirlpool for 5 to 7 days.   No bending, squatting, or lifting anything over 10 pounds (4.5 kg) as directed by your caregiver.   Inspect the site at least twice daily.   Do not drive home if you are discharged the same day of the procedure. Have someone else drive you.   You may drive 24 hours after the procedure unless otherwise instructed by your caregiver.  What to expect:  Any bruising will usually fade within 1 to 2 weeks.   Blood that collects in the tissue (hematoma) may be painful to the touch. It should usually decrease in size and tenderness within 1 to 2 weeks.  SEEK IMMEDIATE MEDICAL CARE IF:  You have unusual pain at the site or down the  affected limb.   You have redness, warmth, swelling, or pain at the site.   You have drainage (other than a small amount of blood on the dressing).   You have chills.   You have a fever or persistent symptoms for more than 72 hours.   You have a fever and your symptoms suddenly get worse.   Your leg becomes pale, cool, tingly, or numb.   You have heavy bleeding from the site. Hold pressure on the site.  Document Released: 12/17/2010 Document Revised: 11/03/2011 Document Reviewed:

## 2012-08-09 NOTE — Discharge Summary (Signed)
CARDIOLOGY DISCHARGE SUMMARY   Patient ID: Frank Simpson MRN: [id number] DOB/AGE: [birth year] 78 y.o.  Admit date: 08/06/2012 Discharge date: 08/09/2012  Primary Discharge Diagnosis:   *STEMI (ST elevation myocardial infarction) - s/p 2.75 x 38 mm Promus Element DES in the mid LAD  Secondary Discharge Diagnosis:  Past Medical History  Diagnosis Date    CRI (chronic renal insufficiency) - stage III     Thrombocytopenia   . Hypertension   . Diabetes mellitus 1994    dm edu 2012  . Hyperlipidemia   . Osteoarthritis   . CRI (chronic renal insufficiency)     baseline Cr seems to be 1.7  . Urinary incontinence     s/p PTNS didn't help  . Glaucoma     and cataracts  . History of melanoma   . CAD (coronary artery disease) 07/2012    acute STEMI, mid LAD PCI - DES  . Thrombocytopenia     Consults: None  Procedures:  1. Left Heart Catheterization 2. Selective Coronary Angiography 3. PTCA/DES x 1 mid LAD 4. 2-D echocardiogram  Hospital Course: Frank Simpson is a 78 year old male with no previous history of coronary artery disease. He came to the hospital with chest pain and had ST changes indicating ST elevation MI. Code STEMI was activated and he was taken to the cath lab.  Cardiac catheterization results are below. He had a drug-eluting stent to the LAD. He was admitted to CCU. He was continued on Integrilin for 18 hours because of residual distal thrombus.   Because of his renal insufficiency, a left ventriculogram was not performed. He had an echocardiogram which showed an EF of 45%. Consideration was given to an ACE inhibitor or ARB and a beta blocker. However, he has chronic renal insufficiency and an ACE/ARB inhibitor was not indicated. He also had some bradycardia and we were not able to add a beta blocker. He had a small hematoma at his cath site in the right groin. This was compressed and improved. He had some ecchymosis but no bruit. He was seen by cardiac rehabilitation. He  ambulated with cardiac rehabilitation and was educated on heart healthy lifestyle as well as increasing his activity level safely. He was a diabetic and his blood sugars were managed with his home insulin dosing plus sliding scale. A lipid profile is listed below. Prior to admission he had been on Crestor 10 mg. Because his LDL is above target, this will be increased to 40 mg. He is to followup with cardiology for a lipid profile and LFTs as an outpatient 6-12 weeks.  He was seen by case management and given a card for Brilinta, 30 days free plus a co-pay assist card. Cardiac rehabilitation continue to follow him. His ability to ambulate gradually increased. The patient and his wife were educated regarding cardiac risk-factor reduction and increasing activity.  On 08/09/2012, Frank Simpson who was seen by Dr. Angelena Form. He was doing well with ambulation and considered stable for discharge, to follow up as an outpatient.  Labs:   Lab Results  Component Value Date   WBC 8.1 08/08/2012   HGB 14.9 08/08/2012   HCT 42.4 08/08/2012   MCV 87.8 08/08/2012   PLT 126* 08/08/2012    Lab 08/09/12 0455 08/06/12 2155  NA 140 --  K 3.9 --  CL 103 --  CO2 29 --  BUN 25* --  CREATININE 1.73* --  CALCIUM 9.6 --  PROT -- 6.3  BILITOT -- 0.4  ALKPHOS --  56  ALT -- 15  AST -- 53*  GLUCOSE 104* --    Basename 08/07/12 0937 08/07/12 0335 08/06/12 2155  CKTOTAL 832* 1109* 595*  CKMB 100.6* 132.9* 58.0*  CKMBINDEX -- -- --  TROPONINI >20.00* >20.00* 15.18*   Lipid Panel     Component Value Date/Time   CHOL 176 08/07/2012 0335   TRIG 278* 08/07/2012 0335   TRIG 152 11/17/2010   HDL 28* 08/07/2012 0335   CHOLHDL 6.3 08/07/2012 0335   VLDL 56* 08/07/2012 0335   LDLCALC 92 08/07/2012 0335    Basename 08/06/12 1953  INR 0.97      Radiology: No results found.  Cardiac Cath: 08/06/2012 Left main: No obstructive disease noted.  Left Anterior Descending Artery: Large caliber vessel that courses to the apex  and gives off a large septal perforating branch and several small diagonal branches. The mid vessel is totally occluded just beyond the septal perforator.  Circumflex Artery: Small to moderate sized vessel. There is an intermediate branch and an obtuse marginal branch. The intermediate branch has 30% proximal stenosis. The obtuse marginal branch has a proximal 30% stenosis.  Right Coronary Artery: Large, dominant vessel with mild plaque in the proximal and mid segment.  Left Ventricular Angiogram: Deferred.  Impression:  1. Acute anterior STEMI secondary to occluded mid LAD  2. Successful PTCA/DES x 1 mid LAD  3. Mild non-obstructive disease proximal Circumflex and proximal Ramus Intermediate branch.   ECG 08-Aug-2012 07:02:06 [facility]-CC[facility] ROUTINE RECORD Sinus bradycardia with 1st degree A-V block with Premature atrial complexes Left anterior fascicular block Anteroseptal infarct , age undetermined T wave abnormality, consider lateral ischemia Abnormal ECG 39m/s 151mmV 100Hz 8.0.1 12SL 241 HD CID: 0 Referred by: Confirmed By: DALoralie ChampagneD Vent. rate 57 BPM PR interval 214 ms QRS duration 120 ms QT/QTc 494/480 ms P-R-T axes 46 -46 138  Echo: 08/07/2012 Study Conclusions - Left ventricle: Septal and apical hypokinesis The cavity size was normal. Wall thickness was increased in a pattern of moderate LVH. The estimated ejection fraction was 45%. - Atrial septum: No defect or patent foramen ovale was identified.  FOLLOW UP PLANS AND APPOINTMENTS Allergies  Allergen Reactions  . Januvia (Sitagliptin Phosphate) Other (See Comments)    Possibly affected kidneys?  . Metformin And Related Other (See Comments)    Affected kidneys  . Penicillins Other (See Comments)    Reaction long time ago - doesn't remember     Medication List     As of 08/09/2012 12:07 PM    TAKE these medications         amLODipine 5 MG tablet   Commonly known as: NORVASC   Take 5 mg  by mouth daily.      aspirin 81 MG tablet   Take 1 tablet (81 mg total) by mouth daily.      glipiZIDE 10 MG tablet   Commonly known as: GLUCOTROL   Take 10 mg by mouth daily.      ibuprofen 200 MG tablet   Commonly known as: ADVIL,MOTRIN   Take 2 tablets (400 mg total) by mouth 3 times/day as needed-between meals & bedtime for pain. Minimize use while on Brilinta.      insulin glargine 100 UNIT/ML injection   Commonly known as: LANTUS   Inject 60 Units into the skin every morning. Qs 1 month      insulin lispro 100 UNIT/ML injection   Commonly known as: HUMALOG   Inject 5-15 Units  into the skin 3 (three) times daily before meals. Sliding scale:  150-200 2 units, 201-250 4 units, 251-300 6 units, 301-350 8 units, >350 10 units, >450 15 units      nitroGLYCERIN 0.4 MG SL tablet   Commonly known as: NITROSTAT   Place 1 tablet (0.4 mg total) under the tongue every 5 (five) minutes as needed for chest pain. May take up to three doses,if pain continues call 911      rosuvastatin 40 MG tablet   Commonly known as: CRESTOR   Take 1 tablet (40 mg total) by mouth daily.      Tamsulosin HCl 0.4 MG Caps   Commonly known as: FLOMAX   Take 1 capsule (0.4 mg total) by mouth daily.      Ticagrelor 90 MG Tabs tablet   Commonly known as: BRILINTA   Take 1 tablet (90 mg total) by mouth 2 (two) times daily.             Discharge Orders    Future Appointments: Provider: Department: Dept Phone: Center:   08/24/2012 9:30 AM Burtis Junes, NP [facility] [phone] None   08/29/2012 12:45 PM Hayden Pedro, MD [facility] [phone] None     Future Orders Please Complete By Expires   Amb Referral to Cardiac Rehabilitation        Follow-up Information    Follow up with Truitt Merle, NP. On 08/24/2012. (See for Dr Mare Ferrari at 9:30 am)    Contact information:   [address] [place] [address] [phone]          BRING ALL MEDICATIONS WITH  YOU TO FOLLOW UP APPOINTMENTS  Time spent with patient to include physician time: 31 min Signed: Rosaria Ferries 08/09/2012, 11:39 AM Co-Sign MD

## 2012-08-09 NOTE — Discharge Summary (Signed)
See full note.cdm

## 2012-08-10 NOTE — ED Provider Notes (Signed)
I saw and evaluated the patient, reviewed the resident's note and I agree with the findings and plan.  Patient seen along with the resident. EKG brought to [place] by Triage MD and cw acute STEMI, hx of recent chest pain consistent with MI. Code STEMI activated by me, after seeing patient. Actually thought it had been activated in triage. ASA given, NTG, heparin started. DW on call cardiology. Patient remained stable in ED.   CRITICAL CARE Performed by: Mervin Kung.   Total critical care time: 30  Critical care time was exclusive of separately billable procedures and treating other patients.  Critical care was necessary to treat or prevent imminent or life-threatening deterioration.  Critical care was time spent personally by me on the following activities: development of treatment plan with patient and/or surrogate as well as nursing, discussions with consultants, evaluation of patient's response to treatment, examination of patient, obtaining history from patient or surrogate, ordering and performing treatments and interventions, ordering and review of laboratory studies, ordering and review of radiographic studies, pulse oximetry and re-evaluation of patient's condition.   Mervin Kung, MD 08/10/12 1055

## 2012-08-13 ENCOUNTER — Other Ambulatory Visit: Payer: Self-pay | Admitting: *Deleted

## 2012-08-13 MED ORDER — GLIPIZIDE 10 MG PO TABS: 10.0000 mg | ORAL_TABLET | Freq: Every day | ORAL | Status: DC

## 2012-08-13 NOTE — Telephone Encounter (Signed)
Refilled glipizide

## 2012-08-24 ENCOUNTER — Encounter: Payer: Self-pay | Admitting: Nurse Practitioner

## 2012-08-24 ENCOUNTER — Ambulatory Visit (INDEPENDENT_AMBULATORY_CARE_PROVIDER_SITE_OTHER): Payer: Medicare Other | Admitting: Nurse Practitioner

## 2012-08-24 VITALS — BP 130/62 | HR 65 | Ht 72.0 in | Wt 202.1 lb

## 2012-08-24 DIAGNOSIS — I213 ST elevation (STEMI) myocardial infarction of unspecified site: Secondary | ICD-10-CM

## 2012-08-24 DIAGNOSIS — I219 Acute myocardial infarction, unspecified: Secondary | ICD-10-CM

## 2012-08-24 DIAGNOSIS — I241 Dressler's syndrome: Secondary | ICD-10-CM

## 2012-08-24 LAB — BASIC METABOLIC PANEL
BUN: 34 mg/dL — ABNORMAL HIGH (ref 6–23)
CO2: 26 mEq/L (ref 19–32)
Calcium: 9.2 mg/dL (ref 8.4–10.5)
Chloride: 103 mEq/L (ref 96–112)
Creatinine, Ser: 2 mg/dL — ABNORMAL HIGH (ref 0.4–1.5)
GFR: 34.56 mL/min — ABNORMAL LOW (ref >60.00)
Glucose, Bld: 217 mg/dL — ABNORMAL HIGH (ref 70–99)
Potassium: 3.6 mEq/L (ref 3.5–5.1)
Sodium: 138 mEq/L (ref 135–145)

## 2012-08-24 LAB — CBC WITH DIFFERENTIAL/PLATELET
Basophils Absolute: 0 10*3/uL (ref 0.0–0.1)
Basophils Relative: 0.2 % (ref 0.0–3.0)
Eosinophils Absolute: 0.1 10*3/uL (ref 0.0–0.7)
Eosinophils Relative: 1 % (ref 0.0–5.0)
HCT: 47.4 % (ref 39.0–52.0)
Hemoglobin: 15.9 g/dL (ref 13.0–17.0)
Lymphocytes Relative: 30 % (ref 12.0–46.0)
Lymphs Abs: 2.4 10*3/uL (ref 0.7–4.0)
MCHC: 33.6 g/dL (ref 30.0–36.0)
MCV: 92.9 fl (ref 78.0–100.0)
Monocytes Absolute: 0.5 10*3/uL (ref 0.1–1.0)
Monocytes Relative: 6.5 % (ref 3.0–12.0)
Neutro Abs: 5 10*3/uL (ref 1.4–7.7)
Neutrophils Relative %: 62.3 % (ref 43.0–77.0)
Platelets: 184 10*3/uL (ref 150.0–400.0)
RBC: 5.11 Mil/uL (ref 4.22–5.81)
RDW: 13.2 % (ref 11.5–14.6)
WBC: 8.1 10*3/uL (ref 4.5–10.5)

## 2012-08-24 NOTE — Patient Instructions (Addendum)
We need to check some labs today.   I want you to start moving more. I have sent a referral to start cardiac rehab  Tylenol for pain  Stay on your current medicines  See Dr. Mare Ferrari in about 6 weeks  Call the [facility] office at [phone] if you have any questions, problems or concerns.

## 2012-08-24 NOTE — Progress Notes (Signed)
Frank Simpson Date of Birth: 03-21-1931 Medical Record #[id number]  History of Present Illness: Frank Simpson is seen back today for a post hospital visit. He is seen for Dr. Mare Ferrari. He has had a recent STEMI with DES to the mid LAD. He is on Brilinta. His other issues include CKD, HTN, DM, HLD, OA, urinary incontinence, glaucoma, melanoma and thrombocytopenia.   He comes in today. He is here with his wife. He says he is doing ok. No more chest pain. Seems to feel better. Not very active but sounds like he did not do a lot before. Tolerating his medicines. No problems with his groin. EF was 45% per his echo. No swelling. Not coughing. Not really short of breath.   Current Outpatient Prescriptions on File Prior to Visit  Medication Sig Dispense Refill  . amLODipine (NORVASC) 5 MG tablet Take 5 mg by mouth daily.      [name] Kitchen aspirin 81 MG tablet Take 1 tablet (81 mg total) by mouth daily.  30 tablet    . glipiZIDE (GLUCOTROL) 10 MG tablet Take 1 tablet (10 mg total) by mouth daily.  90 tablet  3  . insulin glargine (LANTUS) 100 UNIT/ML injection Inject 60 Units into the skin every morning. Qs 1 month  30 mL  3  . insulin lispro (HUMALOG) 100 UNIT/ML injection Inject 5-15 Units into the skin 3 (three) times daily before meals. Sliding scale:  150-200 2 units, 201-250 4 units, 251-300 6 units, 301-350 8 units, >350 10 units, >450 15 units      . nitroGLYCERIN (NITROSTAT) 0.4 MG SL tablet Place 1 tablet (0.4 mg total) under the tongue every 5 (five) minutes as needed for chest pain. Up to three doses,if pain continues call 911  25 tablet  3  . rosuvastatin (CRESTOR) 40 MG tablet Take 1 tablet (40 mg total) by mouth daily.  30 tablet  11  . Tamsulosin HCl (FLOMAX) 0.4 MG CAPS Take 1 capsule (0.4 mg total) by mouth daily.  90 capsule  3  . Ticagrelor (BRILINTA) 90 MG TABS tablet Take 1 tablet (90 mg total) by mouth 2 (two) times daily.  60 tablet  0    Allergies  Allergen Reactions  . Januvia  (Sitagliptin Phosphate) Other (See Comments)    Possibly affected kidneys?  . Metformin And Related Other (See Comments)    Affected kidneys  . Penicillins Other (See Comments)    Reaction long time ago - doesn't remember    Past Medical History  Diagnosis Date  . Hypertension   . Diabetes mellitus 1994    dm edu 2012  . Hyperlipidemia   . Osteoarthritis   . CRI (chronic renal insufficiency)     baseline Cr seems to be 1.7  . Urinary incontinence     s/p PTNS didn't help  . Glaucoma     and cataracts  . History of melanoma   . CAD (coronary artery disease) 07/2012    acute STEMI, mid LAD PCI - DES  . Thrombocytopenia     Past Surgical History  Procedure Date  . Replacement total knee 04/2010    RIGHT KNEE  . Tonsillectomy   . Cardiovascular stress test 04/27/2010    EF 75%, nuclear stress test with normal perfusion, no ischemia  . Foot surgery     metal pin in place  . Shoulder surgery   . Finger surgery     amputated finger  . Leane Call 10/2011  negative for ischemia  . Cataract extraction 12/12, 1/13    bilateral  . Coronary stent placement 07/2012    DES to mid LAD for STEMI    History  Smoking status  . Never Smoker   Smokeless tobacco  . Never Used    History  Alcohol Use No    Family History  Problem Relation Age of Onset  . Stroke Mother     hemorrhage  . Diabetes Mother   . Cancer Father     lung  . Diabetes Brother     Review of Systems: The review of systems is per the HPI.  All other systems were reviewed and are negative.  Physical Exam: BP 130/62  Pulse 65  Ht 6' (1.829 m)  Wt 202 lb 1.9 oz (91.681 kg)  BMI 27.41 kg/m2 Patient is very pleasant and in no acute distress. Skin is warm and dry. Color is normal.  HEENT is unremarkable. Normocephalic/atraumatic. PERRL. Sclera are nonicteric. Neck is supple. No masses. No JVD. Lungs are clear. Cardiac exam shows a regular rate and rhythm. Abdomen is soft. Extremities are without  edema. Gait and ROM are intact. No gross neurologic deficits noted.  LABORATORY DATA: Lab Results  Component Value Date   WBC 8.1 08/08/2012   HGB 14.9 08/08/2012   HCT 42.4 08/08/2012   PLT 126* 08/08/2012   GLUCOSE 104* 08/09/2012   CHOL 176 08/07/2012   TRIG 278* 08/07/2012   HDL 28* 08/07/2012   LDLDIRECT 72 11/17/2010   LDLCALC 92 08/07/2012   ALT 15 08/06/2012   AST 53* 08/06/2012   NA 140 08/09/2012   K 3.9 08/09/2012   CL 103 08/09/2012   CREATININE 1.73* 08/09/2012   BUN 25* 08/09/2012   CO2 29 08/09/2012   TSH 3.204 09/30/2011   INR 0.97 08/06/2012   HGBA1C 8.5* 09/30/2011   MICROALBUR 4.2* 08/11/2011    Echo Study Conclusions  - Left ventricle: Septal and apical hypokinesis The cavity size was normal. Wall thickness was increased in a pattern of moderate LVH. The estimated ejection fraction was 45%. - Atrial septum: No defect or patent foramen ovale was identified.  Cardiac Cath Impression:   1. Acute anterior STEMI secondary to occluded mid LAD  2. Successful PTCA/DES x 1 mid LAD  3. Mild non-obstructive disease proximal Circumflex and proximal Ramus Intermediate branch.   Recommendations: Will admit to CCU. Will run Integrilin gtt at renal dosing for 18 hours given residual distal thrombus. Angiomax stopped in the cath lab. Will start beta blocker, statin. Continue ASA and Brilinta. Will hydrate gently overnight with history of renal insufficiency. Will check echo in am.   Complications: None. The patient tolerated the procedure well.    Assessment / Plan: 1. STEMI with DES to the mid LAD - doing well. On Brilinta and ASA. He will be committed to [facility] for one year  2. CKD - recheck labs today  3. LV dysfunction - EF is about 45%. May need repeat echo in about 3 months.   Overall he is doing well. I have encouraged him to attend cardiac rehab. Needs to increase his activities. No change in his medicines. Will check labs today as well. He will see Dr. Mare Ferrari back in  about 6 weeks. Patient is agreeable to this plan and will call if any problems develop in the interim.

## 2012-08-27 ENCOUNTER — Telehealth: Payer: Self-pay | Admitting: *Deleted

## 2012-08-27 NOTE — Telephone Encounter (Signed)
Message copied by Iona Hansen on Mon Aug 27, 2012  4:23 PM ------      Message from: Burtis Junes      Created: Fri Aug 24, 2012  1:24 PM       Ok to report. Still with some kidney function dysfunction. Recheck BMET on return visit. Avoid Nsaids.

## 2012-08-27 NOTE — Telephone Encounter (Signed)
Called patient regarding labs.  Line just rang and rang with no answer, no machine.  Will try again later. Concepcion Living, CMA

## 2012-08-29 ENCOUNTER — Encounter (INDEPENDENT_AMBULATORY_CARE_PROVIDER_SITE_OTHER): Payer: Medicare Other | Admitting: Ophthalmology

## 2012-08-31 ENCOUNTER — Encounter: Payer: Self-pay | Admitting: Cardiology

## 2012-08-31 ENCOUNTER — Telehealth: Payer: Self-pay | Admitting: *Deleted

## 2012-08-31 NOTE — Telephone Encounter (Signed)
pt notified about lab results and gave verbal understanding today

## 2012-08-31 NOTE — Telephone Encounter (Signed)
Message copied by Michae Kava on Fri Aug 31, 2012  4:58 PM ------      Message from: Burtis Junes      Created: Fri Aug 24, 2012  1:24 PM       Ok to report. Still with some kidney function dysfunction. Recheck BMET on return visit. Avoid Nsaids.

## 2012-09-10 DIAGNOSIS — L578 Other skin changes due to chronic exposure to nonionizing radiation: Secondary | ICD-10-CM | POA: Diagnosis not present

## 2012-09-10 DIAGNOSIS — L570 Actinic keratosis: Secondary | ICD-10-CM | POA: Diagnosis not present

## 2012-09-10 DIAGNOSIS — C4441 Basal cell carcinoma of skin of scalp and neck: Secondary | ICD-10-CM | POA: Diagnosis not present

## 2012-09-10 DIAGNOSIS — D485 Neoplasm of uncertain behavior of skin: Secondary | ICD-10-CM | POA: Diagnosis not present

## 2012-09-13 ENCOUNTER — Encounter (HOSPITAL_COMMUNITY)
Admission: RE | Admit: 2012-09-13 | Discharge: 2012-09-13 | Disposition: A | Discharge status: Home / Self Care | Payer: Medicare Other | Source: Ambulatory Visit | Attending: Cardiology | Admitting: Cardiology

## 2012-09-13 DIAGNOSIS — Z9861 Coronary angioplasty status: Secondary | ICD-10-CM | POA: Insufficient documentation

## 2012-09-13 DIAGNOSIS — Z79899 Other long term (current) drug therapy: Secondary | ICD-10-CM | POA: Insufficient documentation

## 2012-09-13 DIAGNOSIS — Z5189 Encounter for other specified aftercare: Secondary | ICD-10-CM | POA: Insufficient documentation

## 2012-09-13 DIAGNOSIS — I2510 Atherosclerotic heart disease of native coronary artery without angina pectoris: Secondary | ICD-10-CM | POA: Insufficient documentation

## 2012-09-13 DIAGNOSIS — Z888 Allergy status to other drugs, medicaments and biological substances status: Secondary | ICD-10-CM | POA: Insufficient documentation

## 2012-09-13 DIAGNOSIS — H409 Unspecified glaucoma: Secondary | ICD-10-CM | POA: Insufficient documentation

## 2012-09-13 DIAGNOSIS — E7800 Pure hypercholesterolemia, unspecified: Secondary | ICD-10-CM | POA: Insufficient documentation

## 2012-09-13 DIAGNOSIS — I472 Ventricular tachycardia, unspecified: Secondary | ICD-10-CM | POA: Insufficient documentation

## 2012-09-13 DIAGNOSIS — Z880 Allergy status to penicillin: Secondary | ICD-10-CM | POA: Insufficient documentation

## 2012-09-13 DIAGNOSIS — H269 Unspecified cataract: Secondary | ICD-10-CM | POA: Insufficient documentation

## 2012-09-13 DIAGNOSIS — N183 Chronic kidney disease, stage 3 unspecified: Secondary | ICD-10-CM | POA: Insufficient documentation

## 2012-09-13 DIAGNOSIS — Z833 Family history of diabetes mellitus: Secondary | ICD-10-CM | POA: Insufficient documentation

## 2012-09-13 DIAGNOSIS — Z7902 Long term (current) use of antithrombotics/antiplatelets: Secondary | ICD-10-CM | POA: Insufficient documentation

## 2012-09-13 DIAGNOSIS — I1310 Hypertensive heart and chronic kidney disease without heart failure, with stage 1 through stage 4 chronic kidney disease, or unspecified chronic kidney disease: Secondary | ICD-10-CM | POA: Insufficient documentation

## 2012-09-13 DIAGNOSIS — I2582 Chronic total occlusion of coronary artery: Secondary | ICD-10-CM | POA: Insufficient documentation

## 2012-09-13 DIAGNOSIS — Z823 Family history of stroke: Secondary | ICD-10-CM | POA: Insufficient documentation

## 2012-09-13 DIAGNOSIS — I4729 Other ventricular tachycardia: Secondary | ICD-10-CM | POA: Insufficient documentation

## 2012-09-13 DIAGNOSIS — Z794 Long term (current) use of insulin: Secondary | ICD-10-CM | POA: Insufficient documentation

## 2012-09-13 DIAGNOSIS — E119 Type 2 diabetes mellitus without complications: Secondary | ICD-10-CM | POA: Insufficient documentation

## 2012-09-13 DIAGNOSIS — E785 Hyperlipidemia, unspecified: Secondary | ICD-10-CM | POA: Insufficient documentation

## 2012-09-13 DIAGNOSIS — Z7982 Long term (current) use of aspirin: Secondary | ICD-10-CM | POA: Insufficient documentation

## 2012-09-13 DIAGNOSIS — I2109 ST elevation (STEMI) myocardial infarction involving other coronary artery of anterior wall: Secondary | ICD-10-CM | POA: Insufficient documentation

## 2012-09-13 NOTE — Progress Notes (Signed)
Cardiac Rehab Medication Review by a Pharmacist  Does the patient  feel that his/her medications are working for him/her?  yes  Has the patient been experiencing any side effects to the medications prescribed?  no  Does the patient measure his/her own blood pressure or blood glucose at home?  yes   Does the patient have any problems obtaining medications due to transportation or finances?   no  Understanding of regimen: good Understanding of indications: good Potential of compliance: good    Pharmacist comments: Pt endorses that his blood pressure is under good control on current regimen and he has been feeling well since his operation. Pt also states that his fasting blood glucose is often over 300 on his current regimen. I counseled patient to follow up with his PCP to gain better glucose control. Wife states that patient often has trouble knowing when he needs to use the restroom and has to wear pads. I counseled patient to discuss this with his PCP and discussed a urinary schedule to help empty his bladder on a regular basis.    Mosetta Pigeon 09/13/2012 8:31 AM

## 2012-09-15 ENCOUNTER — Other Ambulatory Visit: Payer: Self-pay | Admitting: Cardiology

## 2012-09-15 DIAGNOSIS — Z23 Encounter for immunization: Secondary | ICD-10-CM | POA: Diagnosis not present

## 2012-09-16 ENCOUNTER — Encounter: Payer: Self-pay | Admitting: Family Medicine

## 2012-09-17 ENCOUNTER — Encounter (HOSPITAL_COMMUNITY)
Admission: RE | Admit: 2012-09-17 | Discharge: 2012-09-17 | Disposition: A | Discharge status: Home / Self Care | Payer: Medicare Other | Source: Ambulatory Visit | Attending: Cardiology | Admitting: Cardiology

## 2012-09-17 DIAGNOSIS — E119 Type 2 diabetes mellitus without complications: Secondary | ICD-10-CM | POA: Diagnosis not present

## 2012-09-17 DIAGNOSIS — I2582 Chronic total occlusion of coronary artery: Secondary | ICD-10-CM | POA: Diagnosis not present

## 2012-09-17 DIAGNOSIS — I1310 Hypertensive heart and chronic kidney disease without heart failure, with stage 1 through stage 4 chronic kidney disease, or unspecified chronic kidney disease: Secondary | ICD-10-CM | POA: Diagnosis not present

## 2012-09-17 DIAGNOSIS — Z7902 Long term (current) use of antithrombotics/antiplatelets: Secondary | ICD-10-CM | POA: Diagnosis not present

## 2012-09-17 DIAGNOSIS — Z823 Family history of stroke: Secondary | ICD-10-CM | POA: Diagnosis not present

## 2012-09-17 DIAGNOSIS — Z5189 Encounter for other specified aftercare: Secondary | ICD-10-CM | POA: Diagnosis not present

## 2012-09-17 DIAGNOSIS — I2510 Atherosclerotic heart disease of native coronary artery without angina pectoris: Secondary | ICD-10-CM | POA: Diagnosis not present

## 2012-09-17 DIAGNOSIS — Z794 Long term (current) use of insulin: Secondary | ICD-10-CM | POA: Diagnosis not present

## 2012-09-17 DIAGNOSIS — I2109 ST elevation (STEMI) myocardial infarction involving other coronary artery of anterior wall: Secondary | ICD-10-CM | POA: Diagnosis not present

## 2012-09-17 DIAGNOSIS — Z79899 Other long term (current) drug therapy: Secondary | ICD-10-CM | POA: Diagnosis not present

## 2012-09-17 DIAGNOSIS — I472 Ventricular tachycardia: Secondary | ICD-10-CM | POA: Diagnosis not present

## 2012-09-17 DIAGNOSIS — Z9861 Coronary angioplasty status: Secondary | ICD-10-CM | POA: Diagnosis not present

## 2012-09-17 DIAGNOSIS — H269 Unspecified cataract: Secondary | ICD-10-CM | POA: Diagnosis not present

## 2012-09-17 DIAGNOSIS — Z833 Family history of diabetes mellitus: Secondary | ICD-10-CM | POA: Diagnosis not present

## 2012-09-17 DIAGNOSIS — N183 Chronic kidney disease, stage 3 unspecified: Secondary | ICD-10-CM | POA: Diagnosis not present

## 2012-09-17 DIAGNOSIS — Z888 Allergy status to other drugs, medicaments and biological substances status: Secondary | ICD-10-CM | POA: Diagnosis not present

## 2012-09-17 DIAGNOSIS — Z7982 Long term (current) use of aspirin: Secondary | ICD-10-CM | POA: Diagnosis not present

## 2012-09-17 DIAGNOSIS — E7800 Pure hypercholesterolemia, unspecified: Secondary | ICD-10-CM | POA: Diagnosis not present

## 2012-09-17 DIAGNOSIS — H409 Unspecified glaucoma: Secondary | ICD-10-CM | POA: Diagnosis not present

## 2012-09-17 DIAGNOSIS — E785 Hyperlipidemia, unspecified: Secondary | ICD-10-CM | POA: Diagnosis not present

## 2012-09-17 DIAGNOSIS — Z880 Allergy status to penicillin: Secondary | ICD-10-CM | POA: Diagnosis not present

## 2012-09-17 LAB — GLUCOSE, CAPILLARY: Glucose-Capillary: 256 mg/dL — ABNORMAL HIGH (ref 70–99)

## 2012-09-17 NOTE — Progress Notes (Signed)
Pt started cardiac rehab today.  Pt tolerated light exercise with some difficulty due to his limited activity prior to his cardiac event.  Pt rated his exercise at the level of "hard" for each station.  Pt pre exercise blood glucose is 256.  Pt states that his home blood glucose level are often above 200.  Pt has seen a family doctor in the [place] area but prefers to see someone here in [place].  Monitor shows SR 1 degree  with occasional PVC's noted.  Pt with history of PAC's on his most recent 12 lead EKG will in basket strips for Dr. Mare Ferrari to review.  Will ask MD the referral process for patient to see a primary MD for diabetes management  in the [facility] practice.

## 2012-09-19 ENCOUNTER — Encounter (HOSPITAL_COMMUNITY)
Admission: RE | Admit: 2012-09-19 | Discharge: 2012-09-19 | Disposition: A | Discharge status: Home / Self Care | Payer: Medicare Other | Source: Ambulatory Visit | Attending: Cardiology | Admitting: Cardiology

## 2012-09-19 LAB — GLUCOSE, CAPILLARY
Glucose-Capillary: 256 mg/dL — ABNORMAL HIGH (ref 70–99)
Glucose-Capillary: 169 mg/dL — ABNORMAL HIGH (ref 70–99)

## 2012-09-19 NOTE — Telephone Encounter (Signed)
F/u   [facility] calling [phone], plz return call as they never recvd Refill

## 2012-09-20 ENCOUNTER — Telehealth: Payer: Self-pay | Admitting: Cardiology

## 2012-09-20 NOTE — Telephone Encounter (Signed)
Advised wife ok to use Triamterene as needed for sever swelling per  Dr. Sharia Reeve wife Dr Loanne Drilling and Dr Julianne Rice name and number per  Dr. Mare Ferrari

## 2012-09-20 NOTE — Telephone Encounter (Signed)
Pt's wife requested refill of amlodipine, and hctz, only the amlodipine was called in, however I don't see hctz on his med list, pls call and let her know if he needs to take it or not, if so [facility]

## 2012-09-20 NOTE — Telephone Encounter (Signed)
Message copied by Earvin Hansen on Thu Sep 20, 2012  5:10 PM ------      Message from: Rowe Pavy      Created: Mon Sep 17, 2012  2:12 PM      Regarding: Cardiac Rehab             Pt started cardiac rehab today.  Pt with elevation in blood glucose 256.  Pt reports that his fasting blood glucose is often in the upper 200's.  Pt would like to establish primary care here in [place] with an [place] physician for optimal diabetes management.  He no longer wishes to see his doctor in [place].  What is the referral process within the [place] practices?              FYI - Noted to have occasional PVC's (no beta blocker due to sinus brady) PAC's noted on most recent 12 lead EKG. Pt was asymptomatic            Thanks for the input            Carlette

## 2012-09-21 ENCOUNTER — Encounter (HOSPITAL_COMMUNITY)
Admission: RE | Admit: 2012-09-21 | Discharge: 2012-09-21 | Disposition: A | Discharge status: Home / Self Care | Payer: Medicare Other | Source: Ambulatory Visit | Attending: Cardiology | Admitting: Cardiology

## 2012-09-21 LAB — GLUCOSE, CAPILLARY: Glucose-Capillary: 181 mg/dL — ABNORMAL HIGH (ref 70–99)

## 2012-09-24 ENCOUNTER — Encounter (HOSPITAL_COMMUNITY)
Admission: RE | Admit: 2012-09-24 | Discharge: 2012-09-24 | Disposition: A | Discharge status: Home / Self Care | Payer: Medicare Other | Source: Ambulatory Visit | Attending: Cardiology | Admitting: Cardiology

## 2012-09-24 LAB — GLUCOSE, CAPILLARY: Glucose-Capillary: 158 mg/dL — ABNORMAL HIGH (ref 70–99)

## 2012-09-24 NOTE — Progress Notes (Signed)
Reviewed home exercise with pt today.  Pt plans [place] walk around [place] for exercise.  Reviewed THR, pulse (needs practice, class on Friday), RPE, sign and symptoms, NTG use, and when to call 911 or MD.  Pt voiced understanding. Alberteen Sam, MA, ACSM RCEP

## 2012-09-26 ENCOUNTER — Encounter (HOSPITAL_COMMUNITY): Payer: Medicare Other

## 2012-09-26 ENCOUNTER — Encounter (HOSPITAL_COMMUNITY)
Admission: RE | Admit: 2012-09-26 | Discharge: 2012-09-26 | Disposition: A | Discharge status: Home / Self Care | Payer: Medicare Other | Source: Ambulatory Visit | Attending: Cardiology | Admitting: Cardiology

## 2012-09-28 ENCOUNTER — Encounter (HOSPITAL_COMMUNITY)
Admission: RE | Admit: 2012-09-28 | Discharge: 2012-09-28 | Disposition: A | Discharge status: Home / Self Care | Payer: Medicare Other | Source: Ambulatory Visit | Attending: Cardiology | Admitting: Cardiology

## 2012-09-28 DIAGNOSIS — Z794 Long term (current) use of insulin: Secondary | ICD-10-CM | POA: Insufficient documentation

## 2012-09-28 DIAGNOSIS — E7800 Pure hypercholesterolemia, unspecified: Secondary | ICD-10-CM | POA: Diagnosis not present

## 2012-09-28 DIAGNOSIS — E785 Hyperlipidemia, unspecified: Secondary | ICD-10-CM | POA: Diagnosis not present

## 2012-09-28 DIAGNOSIS — N183 Chronic kidney disease, stage 3 unspecified: Secondary | ICD-10-CM | POA: Insufficient documentation

## 2012-09-28 DIAGNOSIS — Z7902 Long term (current) use of antithrombotics/antiplatelets: Secondary | ICD-10-CM | POA: Insufficient documentation

## 2012-09-28 DIAGNOSIS — H269 Unspecified cataract: Secondary | ICD-10-CM | POA: Insufficient documentation

## 2012-09-28 DIAGNOSIS — Z880 Allergy status to penicillin: Secondary | ICD-10-CM | POA: Insufficient documentation

## 2012-09-28 DIAGNOSIS — Z9861 Coronary angioplasty status: Secondary | ICD-10-CM | POA: Diagnosis not present

## 2012-09-28 DIAGNOSIS — I1310 Hypertensive heart and chronic kidney disease without heart failure, with stage 1 through stage 4 chronic kidney disease, or unspecified chronic kidney disease: Secondary | ICD-10-CM | POA: Insufficient documentation

## 2012-09-28 DIAGNOSIS — I2510 Atherosclerotic heart disease of native coronary artery without angina pectoris: Secondary | ICD-10-CM | POA: Insufficient documentation

## 2012-09-28 DIAGNOSIS — Z888 Allergy status to other drugs, medicaments and biological substances status: Secondary | ICD-10-CM | POA: Diagnosis not present

## 2012-09-28 DIAGNOSIS — Z823 Family history of stroke: Secondary | ICD-10-CM | POA: Insufficient documentation

## 2012-09-28 DIAGNOSIS — I472 Ventricular tachycardia, unspecified: Secondary | ICD-10-CM | POA: Insufficient documentation

## 2012-09-28 DIAGNOSIS — H409 Unspecified glaucoma: Secondary | ICD-10-CM | POA: Insufficient documentation

## 2012-09-28 DIAGNOSIS — I639 Cerebral infarction, unspecified: Secondary | ICD-10-CM

## 2012-09-28 DIAGNOSIS — Z7982 Long term (current) use of aspirin: Secondary | ICD-10-CM | POA: Diagnosis not present

## 2012-09-28 DIAGNOSIS — E119 Type 2 diabetes mellitus without complications: Secondary | ICD-10-CM | POA: Insufficient documentation

## 2012-09-28 DIAGNOSIS — Z79899 Other long term (current) drug therapy: Secondary | ICD-10-CM | POA: Insufficient documentation

## 2012-09-28 DIAGNOSIS — I2582 Chronic total occlusion of coronary artery: Secondary | ICD-10-CM | POA: Insufficient documentation

## 2012-09-28 DIAGNOSIS — I4729 Other ventricular tachycardia: Secondary | ICD-10-CM | POA: Insufficient documentation

## 2012-09-28 DIAGNOSIS — Z5189 Encounter for other specified aftercare: Secondary | ICD-10-CM | POA: Insufficient documentation

## 2012-09-28 DIAGNOSIS — I2109 ST elevation (STEMI) myocardial infarction involving other coronary artery of anterior wall: Secondary | ICD-10-CM | POA: Diagnosis not present

## 2012-09-28 DIAGNOSIS — Z833 Family history of diabetes mellitus: Secondary | ICD-10-CM | POA: Diagnosis not present

## 2012-09-28 HISTORY — DX: Cerebral infarction, unspecified: I63.9

## 2012-09-28 LAB — GLUCOSE, CAPILLARY: Glucose-Capillary: 95 mg/dL (ref 70–99)

## 2012-09-28 NOTE — Progress Notes (Signed)
Pt with low blood glucose reading post exercise -80   Pt given lemonade and banana.  Recheck  95.  Pt will eat lunch when he gets home. Pt advised to eat a snack prior to exercise.

## 2012-10-01 ENCOUNTER — Encounter (HOSPITAL_COMMUNITY)
Admission: RE | Admit: 2012-10-01 | Discharge: 2012-10-01 | Disposition: A | Discharge status: Home / Self Care | Payer: Medicare Other | Source: Ambulatory Visit | Attending: Cardiology | Admitting: Cardiology

## 2012-10-01 LAB — GLUCOSE, CAPILLARY
Glucose-Capillary: 190 mg/dL — ABNORMAL HIGH (ref 70–99)
Glucose-Capillary: 125 mg/dL — ABNORMAL HIGH (ref 70–99)

## 2012-10-01 NOTE — Progress Notes (Signed)
Darran L Shakoor 78 y.o. male Nutrition Note Spoke with pt.  Nutrition Plan and cholesterol goals reviewed with pt. Pt eats out 4-5 meals/week "for convenience." Pt is diabetic. Last A1c indicates blood glucose not well-controlled. Pt unaware if his A1c has been re-checked since 09/30/11. This Probation officer went over Diabetes Education test results. Pt expressed understanding of the information reviewed. Pt aware of nutrition education classes offered.  Nutrition Diagnosis   Food-and nutrition-related knowledge deficit related to lack of exposure to information as related to diagnosis of: ? CVD ? DM (A1c 8.5)  Nutrition RX/ Estimated Daily Nutrition Needs for: wt maintenance 2300-2600 Kcal, 75-85 gm fat, 15-18 gm sat fat, 2.3-2.6 gm trans-fat, <1500 mg sodium, 325 gm CHO   Nutrition Intervention   Pt's individual nutrition plan including cholesterol goals reviewed with pt.   Benefits of adopting Therapeutic Lifestyle Changes discussed when Medficts reviewed.   Pt to attend the Portion Distortion class   Pt to attend the  ? Nutrition I class                     ? Nutrition II class        ? Diabetes Blitz class       ? Diabetes Q & A class   Continue client-centered nutrition education by RD, as part of interdisciplinary care. Goal(s)   Pt to describe the benefit of including fruits, vegetables, whole grains, and low-fat dairy products in a heart healthy meal plan.   CBG concentrations as close to normal as is safely possible. Monitor and Evaluate progress toward nutrition goal with team. Nutrition Risk: High

## 2012-10-03 ENCOUNTER — Encounter (HOSPITAL_COMMUNITY)
Admission: RE | Admit: 2012-10-03 | Discharge: 2012-10-03 | Disposition: A | Discharge status: Home / Self Care | Payer: Medicare Other | Source: Ambulatory Visit | Attending: Cardiology | Admitting: Cardiology

## 2012-10-03 LAB — GLUCOSE, CAPILLARY
Glucose-Capillary: 193 mg/dL — ABNORMAL HIGH (ref 70–99)
Glucose-Capillary: 223 mg/dL — ABNORMAL HIGH (ref 70–99)

## 2012-10-05 ENCOUNTER — Encounter (HOSPITAL_COMMUNITY)
Admission: RE | Admit: 2012-10-05 | Discharge: 2012-10-05 | Disposition: A | Discharge status: Home / Self Care | Payer: Medicare Other | Source: Ambulatory Visit | Attending: Cardiology | Admitting: Cardiology

## 2012-10-05 LAB — GLUCOSE, CAPILLARY: Glucose-Capillary: 288 mg/dL — ABNORMAL HIGH (ref 70–99)

## 2012-10-08 ENCOUNTER — Encounter (HOSPITAL_COMMUNITY)
Admission: RE | Admit: 2012-10-08 | Discharge: 2012-10-08 | Disposition: A | Discharge status: Home / Self Care | Payer: Medicare Other | Source: Ambulatory Visit | Attending: Cardiology | Admitting: Cardiology

## 2012-10-10 ENCOUNTER — Encounter (HOSPITAL_COMMUNITY)
Admission: RE | Admit: 2012-10-10 | Discharge: 2012-10-10 | Disposition: A | Discharge status: Home / Self Care | Payer: Medicare Other | Source: Ambulatory Visit | Attending: Cardiology | Admitting: Cardiology

## 2012-10-10 LAB — GLUCOSE, CAPILLARY: Glucose-Capillary: 98 mg/dL (ref 70–99)

## 2012-10-11 ENCOUNTER — Encounter: Payer: Self-pay | Admitting: Cardiology

## 2012-10-11 ENCOUNTER — Ambulatory Visit (INDEPENDENT_AMBULATORY_CARE_PROVIDER_SITE_OTHER): Payer: Medicare Other | Admitting: Cardiology

## 2012-10-11 VITALS — BP 130/58 | HR 60 | Resp 18 | Ht 72.0 in | Wt 211.0 lb

## 2012-10-11 DIAGNOSIS — I119 Hypertensive heart disease without heart failure: Secondary | ICD-10-CM | POA: Diagnosis not present

## 2012-10-11 DIAGNOSIS — I259 Chronic ischemic heart disease, unspecified: Secondary | ICD-10-CM

## 2012-10-11 DIAGNOSIS — E7800 Pure hypercholesterolemia, unspecified: Secondary | ICD-10-CM

## 2012-10-11 NOTE — Assessment & Plan Note (Signed)
The patient is on Crestor 40 mg daily.  Is not having any myalgias or side effects.  When he returns at his next visit we will plan to recheck fasting lab work.

## 2012-10-11 NOTE — Assessment & Plan Note (Signed)
His blood sugars were gradually coming under better control.  He presently is on glipizide as well as Lantus insulin and sliding scale.  He is in the process of switching to a new primary care provider here in [place]. The patient has not been experiencing any hypoglycemic episodes.

## 2012-10-11 NOTE — Assessment & Plan Note (Signed)
The patient has had no recurrent chest pain to suggest angina pectoralis

## 2012-10-11 NOTE — Progress Notes (Signed)
Frank Simpson Date of Birth:  1931-09-11 [facility] [address] [place], [place]  [address] [phone]         Fax   [phone]  History of Present Illness: Frank Simpson is seen back today for a scheduled followup office visit.  On 08/06/2012 the patient had a  STEMI with DES to the mid LAD. He is on Brilinta and a baby aspirin daily. His other issues include CKD, HTN, DM, HLD, OA, urinary incontinence, glaucoma, melanoma and thrombocytopenia.  He comes in today. He is here with his wife. He says he is doing ok. No more chest pain. Seems to feel better. Not very active but sounds like he did not do a lot before. Tolerating his medicines. . EF was 45% per his echo.  The patient has been in the cardiac rehabilitation program and has enjoyed the program.  He denies any chest pain and has had to take nitroglycerin.  Of note is the fact that on 11/15/12, 9 months prior to his heart attack, he had a normal lexiscan with no evidence of ischemia and his ejection fraction was 60%.   Current Outpatient Prescriptions  Medication Sig Dispense Refill  . amLODipine (NORVASC) 5 MG tablet TAKE 1 TABLET BY MOUTH EVERY DAY  90 tablet  3  . aspirin 81 MG tablet Take 1 tablet (81 mg total) by mouth daily.  30 tablet    . glipiZIDE (GLUCOTROL) 10 MG tablet Take 1 tablet (10 mg total) by mouth daily.  90 tablet  3  . insulin glargine (LANTUS) 100 UNIT/ML injection Inject 60 Units into the skin every morning. Qs 1 month  30 mL  3  . insulin lispro (HUMALOG) 100 UNIT/ML injection Inject 5-15 Units into the skin 3 (three) times daily before meals. Sliding scale:  150-200 2 units, 201-250 4 units, 251-300 6 units, 301-350 8 units, >350 10 units, >450 15 units      . nitroGLYCERIN (NITROSTAT) 0.4 MG SL tablet Place 1 tablet (0.4 mg total) under the tongue every 5 (five) minutes as needed for chest pain. Up to three doses,if pain continues call 911  25 tablet  3  . rosuvastatin (CRESTOR) 40 MG  tablet Take 1 tablet (40 mg total) by mouth daily.  30 tablet  11  . Tamsulosin HCl (FLOMAX) 0.4 MG CAPS Take 1 capsule (0.4 mg total) by mouth daily.  90 capsule  3  . Ticagrelor (BRILINTA) 90 MG TABS tablet Take 1 tablet (90 mg total) by mouth 2 (two) times daily.  60 tablet  0    Allergies  Allergen Reactions  . Januvia (Sitagliptin Phosphate) Other (See Comments)    Possibly affected kidneys?  . Metformin And Related Other (See Comments)    Affected kidneys  . Penicillins Other (See Comments)    Reaction long time ago - doesn't remember    Patient Active Problem List  Diagnosis  . Type II or unspecified type diabetes mellitus without mention of complication, uncontrolled  . Hypercholesterolemia  . Benign hypertensive heart disease without heart failure  . Osteoarthritis  . CRI (chronic renal insufficiency)  . Urinary incontinence  . Epigastric pain  . Chest pain  . Shoulder pain  . STEMI (ST elevation myocardial infarction)  . Thrombocytopenia    History  Smoking status  . Never Smoker   Smokeless tobacco  . Never Used    History  Alcohol Use No    Family History  Problem Relation Age of  Onset  . Stroke Mother     hemorrhage  . Diabetes Mother   . Cancer Father     lung  . Diabetes Brother     Review of Systems: Constitutional: no fever chills diaphoresis or fatigue or change in weight.  Head and neck: no hearing loss, no epistaxis, no photophobia or visual disturbance. Respiratory: No cough, shortness of breath or wheezing. Cardiovascular: No chest pain peripheral edema, palpitations. Gastrointestinal: No abdominal distention, no abdominal pain, no change in bowel habits hematochezia or melena. Genitourinary: No dysuria, no frequency, no urgency, no nocturia. Musculoskeletal:No arthralgias, no back pain, no gait disturbance or myalgias. Neurological: No dizziness, no headaches, no numbness, no seizures, no syncope, no weakness, no tremors. Hematologic:  No lymphadenopathy, no easy bruising. Psychiatric: No confusion, no hallucinations, no sleep disturbance.    Physical Exam: Filed Vitals:   10/11/12 1449  BP: 130/58  Pulse: 60  Resp: 18   the general appearance reveals a well-developed well-nourished gentleman in no distress.The head and neck exam reveals pupils equal and reactive.  Extraocular movements are full.  There is no scleral icterus.  The mouth and pharynx are normal.  The neck is supple.  The carotids reveal no bruits.  The jugular venous pressure is normal.  The  thyroid is not enlarged.  There is no lymphadenopathy.  The chest is clear to percussion and auscultation.  There are no rales or rhonchi.  Expansion of the chest is symmetrical.  The precordium is quiet.  The first heart sound is normal.  The second heart sound is physiologically split.  There is no murmur gallop rub or click.  There is no abnormal lift or heave.  The abdomen is soft and nontender.  The bowel sounds are normal.  The liver and spleen are not enlarged.  There are no abdominal masses.  There are no abdominal bruits.  Extremities reveal good pedal pulses.  There is no phlebitis or edema.  There is no cyanosis or clubbing.  Strength is normal and symmetrical in all extremities.  There is no lateralizing weakness.  There are no sensory deficits.  The skin is warm and dry.  There is no rash.  EKG shows sinus bradycardia and a pattern of a resolving anteroseptal myocardial infarction.  Since 08/06/12, the hyperacute ST segment elevation in the anteroseptal leads has resolved.    Assessment / Plan: The patient is to continue same medication.  His weight is up 9 pounds since I last saw him I want him to lose weight.  Recheck in 3 months for followup office visit EKG lipid panel hepatic function panel and basal metabolic panel.

## 2012-10-11 NOTE — Assessment & Plan Note (Signed)
Blood pressure has been remaining stable on current therapy.  No headaches or dizziness.  No symptoms of CHF

## 2012-10-11 NOTE — Patient Instructions (Addendum)
Your physician recommends that you continue on your current medications as directed. Please refer to the Current Medication list given to you today.  Your physician recommends that you schedule a follow-up appointment in: 3 months with fasting labs (LP/BMET/HFP) and ekg

## 2012-10-12 ENCOUNTER — Encounter (HOSPITAL_COMMUNITY)
Admission: RE | Admit: 2012-10-12 | Discharge: 2012-10-12 | Disposition: A | Discharge status: Home / Self Care | Payer: Medicare Other | Source: Ambulatory Visit | Attending: Cardiology | Admitting: Cardiology

## 2012-10-15 ENCOUNTER — Observation Stay (HOSPITAL_COMMUNITY)
Admission: EM | Admit: 2012-10-15 | Discharge: 2012-10-16 | Disposition: A | Discharge status: Home / Self Care | Payer: Medicare Other | Attending: Cardiovascular Disease | Admitting: Cardiovascular Disease

## 2012-10-15 ENCOUNTER — Emergency Department (HOSPITAL_COMMUNITY): Payer: Medicare Other

## 2012-10-15 ENCOUNTER — Telehealth: Payer: Self-pay | Admitting: Family Medicine

## 2012-10-15 ENCOUNTER — Encounter (HOSPITAL_COMMUNITY): Payer: Self-pay | Admitting: *Deleted

## 2012-10-15 ENCOUNTER — Encounter (HOSPITAL_COMMUNITY): Payer: Medicare Other

## 2012-10-15 ENCOUNTER — Encounter (HOSPITAL_COMMUNITY)
Admission: EM | Disposition: A | Discharge status: Home / Self Care | Payer: Self-pay | Source: Home / Self Care | Attending: Emergency Medicine

## 2012-10-15 DIAGNOSIS — I200 Unstable angina: Secondary | ICD-10-CM

## 2012-10-15 DIAGNOSIS — E7800 Pure hypercholesterolemia, unspecified: Secondary | ICD-10-CM | POA: Diagnosis present

## 2012-10-15 DIAGNOSIS — M1990 Unspecified osteoarthritis, unspecified site: Secondary | ICD-10-CM | POA: Insufficient documentation

## 2012-10-15 DIAGNOSIS — N183 Chronic kidney disease, stage 3 unspecified: Secondary | ICD-10-CM | POA: Diagnosis not present

## 2012-10-15 DIAGNOSIS — R079 Chest pain, unspecified: Secondary | ICD-10-CM | POA: Diagnosis not present

## 2012-10-15 DIAGNOSIS — I509 Heart failure, unspecified: Secondary | ICD-10-CM

## 2012-10-15 DIAGNOSIS — D696 Thrombocytopenia, unspecified: Secondary | ICD-10-CM | POA: Diagnosis not present

## 2012-10-15 DIAGNOSIS — I129 Hypertensive chronic kidney disease with stage 1 through stage 4 chronic kidney disease, or unspecified chronic kidney disease: Secondary | ICD-10-CM | POA: Insufficient documentation

## 2012-10-15 DIAGNOSIS — I2510 Atherosclerotic heart disease of native coronary artery without angina pectoris: Secondary | ICD-10-CM

## 2012-10-15 DIAGNOSIS — Z79899 Other long term (current) drug therapy: Secondary | ICD-10-CM | POA: Insufficient documentation

## 2012-10-15 DIAGNOSIS — H409 Unspecified glaucoma: Secondary | ICD-10-CM | POA: Diagnosis not present

## 2012-10-15 DIAGNOSIS — Z9861 Coronary angioplasty status: Secondary | ICD-10-CM | POA: Diagnosis not present

## 2012-10-15 DIAGNOSIS — E119 Type 2 diabetes mellitus without complications: Secondary | ICD-10-CM | POA: Diagnosis not present

## 2012-10-15 DIAGNOSIS — I5023 Acute on chronic systolic (congestive) heart failure: Secondary | ICD-10-CM | POA: Insufficient documentation

## 2012-10-15 DIAGNOSIS — E785 Hyperlipidemia, unspecified: Secondary | ICD-10-CM | POA: Diagnosis not present

## 2012-10-15 DIAGNOSIS — R404 Transient alteration of awareness: Secondary | ICD-10-CM | POA: Diagnosis not present

## 2012-10-15 DIAGNOSIS — I252 Old myocardial infarction: Secondary | ICD-10-CM | POA: Diagnosis not present

## 2012-10-15 DIAGNOSIS — R0602 Shortness of breath: Secondary | ICD-10-CM | POA: Diagnosis not present

## 2012-10-15 DIAGNOSIS — I209 Angina pectoris, unspecified: Secondary | ICD-10-CM | POA: Insufficient documentation

## 2012-10-15 HISTORY — PX: CARDIAC CATHETERIZATION: SHX172

## 2012-10-15 HISTORY — DX: Adverse effect of unspecified anesthetic, initial encounter: T41.45XA

## 2012-10-15 HISTORY — DX: Other complications of anesthesia, initial encounter: T88.59XA

## 2012-10-15 HISTORY — PX: LEFT HEART CATHETERIZATION WITH CORONARY ANGIOGRAM: SHX5451

## 2012-10-15 LAB — COMPREHENSIVE METABOLIC PANEL
ALT: 24 U/L (ref 0–53)
CO2: 23 mEq/L (ref 19–32)
Calcium: 9.3 mg/dL (ref 8.4–10.5)
Chloride: 105 mEq/L (ref 96–112)
GFR calc Af Amer: 47 mL/min — ABNORMAL LOW (ref >90)
GFR calc non Af Amer: 41 mL/min — ABNORMAL LOW (ref >90)
Glucose, Bld: 170 mg/dL — ABNORMAL HIGH (ref 70–99)
Sodium: 139 mEq/L (ref 135–145)
Total Bilirubin: 0.6 mg/dL (ref 0.3–1.2)

## 2012-10-15 LAB — GLUCOSE, CAPILLARY

## 2012-10-15 LAB — CBC WITH DIFFERENTIAL/PLATELET
Eosinophils Relative: 1 % (ref 0–5)
HCT: 43.4 % (ref 39.0–52.0)
Lymphocytes Relative: 47 % — ABNORMAL HIGH (ref 12–46)
Lymphs Abs: 3 10*3/uL (ref 0.7–4.0)
MCV: 89.7 fL (ref 78.0–100.0)
Monocytes Absolute: 0.7 10*3/uL (ref 0.1–1.0)
Neutro Abs: 2.6 10*3/uL (ref 1.7–7.7)
Platelets: 164 10*3/uL (ref 150–400)
RBC: 4.84 MIL/uL (ref 4.22–5.81)
WBC: 6.3 10*3/uL (ref 4.0–10.5)

## 2012-10-15 LAB — PROTIME-INR: INR: 1 (ref 0.00–1.49)

## 2012-10-15 LAB — POCT ACTIVATED CLOTTING TIME: Activated Clotting Time: 309 seconds

## 2012-10-15 SURGERY — LEFT HEART CATHETERIZATION WITH CORONARY ANGIOGRAM
Anesthesia: LOCAL

## 2012-10-15 MED ORDER — ZOLPIDEM TARTRATE 5 MG PO TABS: 5.0000 mg | ORAL_TABLET | Freq: Every evening | ORAL | Status: DC | PRN

## 2012-10-15 MED ORDER — HALOPERIDOL LACTATE 5 MG/ML IJ SOLN
0.5000 mg | INTRAMUSCULAR | Status: DC | PRN
  Filled 2012-10-15: qty 0.2

## 2012-10-15 MED ORDER — TICAGRELOR 90 MG PO TABS
90.0000 mg | ORAL_TABLET | Freq: Two times a day (BID) | ORAL | Status: DC
  Administered 2012-10-16: 90 mg via ORAL
  Filled 2012-10-15 (×3): qty 1

## 2012-10-15 MED ORDER — SODIUM CHLORIDE 0.9 % IJ SOLN: 3.0000 mL | Freq: Two times a day (BID) | INTRAMUSCULAR | Status: DC

## 2012-10-15 MED ORDER — NITROGLYCERIN 0.2 MG/ML ON CALL CATH LAB
INTRAVENOUS | Status: AC
  Filled 2012-10-15: qty 1

## 2012-10-15 MED ORDER — INSULIN GLARGINE 100 UNIT/ML ~~LOC~~ SOLN
60.0000 [IU] | Freq: Every day | SUBCUTANEOUS | Status: DC
  Administered 2012-10-16: 14:00:00 60 [IU] via SUBCUTANEOUS

## 2012-10-15 MED ORDER — SODIUM CHLORIDE 0.9 % IV SOLN: 250.0000 mL | INTRAVENOUS | Status: DC | PRN

## 2012-10-15 MED ORDER — SODIUM CHLORIDE 0.9 % IJ SOLN: 3.0000 mL | INTRAMUSCULAR | Status: DC | PRN

## 2012-10-15 MED ORDER — TAMSULOSIN HCL 0.4 MG PO CAPS
0.4000 mg | ORAL_CAPSULE | Freq: Every day | ORAL | Status: DC
  Administered 2012-10-16: 11:00:00 0.4 mg via ORAL
  Filled 2012-10-15 (×2): qty 1

## 2012-10-15 MED ORDER — SODIUM CHLORIDE 0.9 % IV SOLN
Freq: Once | INTRAVENOUS | Status: AC
  Administered 2012-10-15: 08:00:00 via INTRAVENOUS

## 2012-10-15 MED ORDER — INSULIN ASPART 100 UNIT/ML ~~LOC~~ SOLN: 5.0000 [IU] | Freq: Three times a day (TID) | SUBCUTANEOUS | Status: DC

## 2012-10-15 MED ORDER — FUROSEMIDE 40 MG PO TABS: 40.0000 mg | ORAL_TABLET | Freq: Once | ORAL | Status: DC

## 2012-10-15 MED ORDER — MORPHINE SULFATE 2 MG/ML IJ SOLN
2.0000 mg | Freq: Once | INTRAMUSCULAR | Status: AC
  Administered 2012-10-15: 2 mg via INTRAVENOUS
  Filled 2012-10-15: qty 1

## 2012-10-15 MED ORDER — ASPIRIN EC 81 MG PO TBEC
81.0000 mg | DELAYED_RELEASE_TABLET | Freq: Every day | ORAL | Status: DC
  Administered 2012-10-16: 11:00:00 81 mg via ORAL
  Filled 2012-10-15: qty 1

## 2012-10-15 MED ORDER — LIDOCAINE HCL (PF) 1 % IJ SOLN
INTRAMUSCULAR | Status: AC
  Filled 2012-10-15: qty 30

## 2012-10-15 MED ORDER — ACETAMINOPHEN 325 MG PO TABS
650.0000 mg | ORAL_TABLET | ORAL | Status: DC | PRN
  Administered 2012-10-15: 20:00:00 650 mg via ORAL
  Filled 2012-10-15: qty 2

## 2012-10-15 MED ORDER — NITROGLYCERIN 0.4 MG SL SUBL: 0.4000 mg | SUBLINGUAL_TABLET | SUBLINGUAL | Status: DC | PRN

## 2012-10-15 MED ORDER — DIAZEPAM 5 MG PO TABS
5.0000 mg | ORAL_TABLET | ORAL | Status: AC
  Administered 2012-10-15: 5 mg via ORAL

## 2012-10-15 MED ORDER — SODIUM CHLORIDE 0.9 % IV SOLN
INTRAVENOUS | Status: DC
  Administered 2012-10-15: 12:00:00 via INTRAVENOUS

## 2012-10-15 MED ORDER — NITROGLYCERIN 0.4 MG SL SUBL
0.4000 mg | SUBLINGUAL_TABLET | SUBLINGUAL | Status: DC | PRN
  Administered 2012-10-15: 0.4 mg via SUBLINGUAL
  Filled 2012-10-15: qty 25

## 2012-10-15 MED ORDER — SODIUM CHLORIDE 0.9 % IV SOLN
1.0000 mL/kg/h | INTRAVENOUS | Status: AC
  Administered 2012-10-15: 1 mL/kg/h via INTRAVENOUS

## 2012-10-15 MED ORDER — AMLODIPINE BESYLATE 5 MG PO TABS
5.0000 mg | ORAL_TABLET | Freq: Every day | ORAL | Status: DC
  Administered 2012-10-16: 11:00:00 5 mg via ORAL
  Filled 2012-10-15 (×2): qty 1

## 2012-10-15 MED ORDER — INSULIN GLARGINE 100 UNIT/ML ~~LOC~~ SOLN
60.0000 [IU] | SUBCUTANEOUS | Status: DC
  Administered 2012-10-15: 60 [IU] via SUBCUTANEOUS
  Filled 2012-10-15: qty 1

## 2012-10-15 MED ORDER — POTASSIUM CHLORIDE CRYS ER 20 MEQ PO TBCR
20.0000 meq | EXTENDED_RELEASE_TABLET | Freq: Once | ORAL | Status: AC
  Administered 2012-10-15: 20 meq via ORAL
  Filled 2012-10-15: qty 1

## 2012-10-15 MED ORDER — ASPIRIN 81 MG PO TABS: 81.0000 mg | ORAL_TABLET | Freq: Every day | ORAL | Status: DC

## 2012-10-15 MED ORDER — MIDAZOLAM HCL 2 MG/2ML IJ SOLN
INTRAMUSCULAR | Status: AC
  Filled 2012-10-15: qty 2

## 2012-10-15 MED ORDER — HEPARIN (PORCINE) IN NACL 2-0.9 UNIT/ML-% IJ SOLN
INTRAMUSCULAR | Status: AC
  Filled 2012-10-15: qty 1000

## 2012-10-15 MED ORDER — DIAZEPAM 5 MG PO TABS
ORAL_TABLET | ORAL | Status: AC
  Filled 2012-10-15: qty 1

## 2012-10-15 MED ORDER — ISOSORBIDE MONONITRATE ER 60 MG PO TB24
60.0000 mg | ORAL_TABLET | Freq: Every day | ORAL | Status: DC
  Administered 2012-10-15: 20:00:00 60 mg via ORAL
  Filled 2012-10-15 (×2): qty 1

## 2012-10-15 MED ORDER — ONDANSETRON HCL 4 MG/2ML IJ SOLN: 4.0000 mg | Freq: Four times a day (QID) | INTRAMUSCULAR | Status: DC | PRN

## 2012-10-15 MED ORDER — VERAPAMIL HCL 2.5 MG/ML IV SOLN
INTRAVENOUS | Status: AC
  Filled 2012-10-15: qty 2

## 2012-10-15 MED ORDER — HALOPERIDOL LACTATE 5 MG/ML IJ SOLN
1.0000 mg | Freq: Once | INTRAMUSCULAR | Status: DC
  Filled 2012-10-15: qty 0.2

## 2012-10-15 MED ORDER — ALPRAZOLAM 0.25 MG PO TABS: 0.2500 mg | ORAL_TABLET | Freq: Two times a day (BID) | ORAL | Status: DC | PRN

## 2012-10-15 MED ORDER — ATORVASTATIN CALCIUM 80 MG PO TABS
80.0000 mg | ORAL_TABLET | Freq: Every day | ORAL | Status: DC
  Filled 2012-10-15 (×3): qty 1

## 2012-10-15 MED ORDER — HALOPERIDOL LACTATE 5 MG/ML IJ SOLN: 0.5000 mg | INTRAMUSCULAR | Status: DC | PRN

## 2012-10-15 MED ORDER — ASPIRIN 81 MG PO CHEW
324.0000 mg | CHEWABLE_TABLET | Freq: Once | ORAL | Status: AC
  Administered 2012-10-15: 324 mg via ORAL
  Filled 2012-10-15: qty 4

## 2012-10-15 NOTE — Progress Notes (Signed)
Patient continues to confused and taking clothes off requiring 2-3 people to assist with him to stay in the room. Dr. Elias Else paed to come and assess patient. Wife at bedside. Safety sitter in room will continue to monitor.

## 2012-10-15 NOTE — ED Provider Notes (Signed)
Medical screening examination/treatment/procedure(s) were conducted as a shared visit with non-physician practitioner(s) and myself.  I personally evaluated the patient during the encounter  Please see my separate respective documentation pertaining to this patient encounter   Johnna Acosta, MD 10/15/12 [phone]

## 2012-10-15 NOTE — Progress Notes (Signed)
Called to see pt re: confusion/agitation Pt oriented to name only. He has repeatedly tried to get out of bed but has not been combative. He has responded well to verbal requests, but continues to want to get out of bed. Sitter now in room.  Reviewed meds, pt rec'd Valium 5 mg pre-cath per charting and by verbal report, also had 1 mg Versed for procedure. No other recent sedating or new meds. Continue sitter and reserve further medications for combative behavior or severe agitation.

## 2012-10-15 NOTE — Interval H&P Note (Signed)
History and Physical Interval Note:  10/15/2012 3:34 PM  Frank Simpson  has presented today for surgery, with the diagnosis of cp  The various methods of treatment have been discussed with the patient and family. After consideration of risks, benefits and other options for treatment, the patient has consented to  Procedure(s) (LRB) with comments: LEFT HEART CATHETERIZATION WITH CORONARY ANGIOGRAM (N/A) as a surgical intervention .  The patient's history has been reviewed, patient examined, no change in status, stable for surgery.  I have reviewed the patient's chart and labs.  Questions were answered to the patient's satisfaction.     [name] [facility] 10/15/2012 3:34 PM

## 2012-10-15 NOTE — CV Procedure (Signed)
Cardiac Catheterization Procedure Note  Name: Frank Simpson MRN: [id number] DOB: 12/28/1930  Procedure: Left Heart Cath, Selective Coronary Angiography, LV angiography, IVUS of the left main coronary artery  Indication: 78 year old white male status post acute anterior myocardial infarction in September 2013. This was treated with emergent drug-eluting stent to the proximal to mid LAD. He presents today with recurrent anginal symptoms.   Procedural Details: The right wrist was prepped, draped, and anesthetized with 1% lidocaine. Using the modified Seldinger technique, a 5 French sheath was introduced into the right radial artery. 3 mg of verapamil was administered through the sheath, weight-based unfractionated heparin was administered intravenously. Standard Judkins catheters were used for selective coronary angiography and left ventriculography. Catheter exchanges were performed over an exchange length guidewire. There were no immediate procedural complications. A TR band was used for radial hemostasis at the completion of the procedure.  The patient was transferred to the post catheterization recovery area for further monitoring.  Procedural Findings: Hemodynamics: AO 160/72 with a mean of 106 mmHg LV 159/18 mmHg  Coronary angiography: Coronary dominance: right  Left mainstem: There is eccentric plaque in the distal left main. In the LAO caudal view this appears to be about 70%. In the RAO views the stenosis appears less.  Left anterior descending (LAD): The LAD is widely patent throughout the stented segment. In the far distal LAD there is a focal 80-90% stenosis.  There is a moderate size ramus intermediate branch. It has a 40-50% stenosis proximally.  Left circumflex (LCx): The left circumflex is a small vessel given rise to a single marginal branch. There is a 70% stenosis in the proximal marginal branch.  Right coronary artery (RCA): The right coronary is a dominant vessel. It  has moderate plaque is less than 10%.  Left ventriculography: Left ventricular systolic function is normal, LVEF is estimated at 55%, there is minimal anterior apical hypokinesis. There is no significant mitral insufficiency.  Given the intermediate nature of the distal left main stenosis we elected to evaluate further with intravascular ultrasound. We exchanged for a 6 French arterial sheath. Patient received IV heparin to achieve a therapeutic ACT of 309 seconds. A 6 French XB LAD 3.5 guide was used. A pro-water guidewire was used to cross the left main into the LAD. We then performed intravascular ultrasound of the proximal LAD and left main. This demonstrated eccentric calcified plaque in the left main. The minimal lumen area was 7 mm square. This is consistent with a lesion that was not hemodynamically significant.  Final Conclusions:   1. Two-vessel obstructive coronary disease. There is a moderate-severe stenosis in the far distal LAD. There is a modest lesion in a small left circumflex artery. The left main coronary has moderate distal disease. By intravascular ultrasound is does not appear to be of hemodynamic significance. The stent in the LAD is widely patent. 2. Good left ventricular systolic function.   Recommendations: Would recommend continue medical therapy with aspirin, Brilinta, and Norvasc. We will add a long-acting nitrate. He is not a candidate for beta blockers due to to bradycardia.   Frank Simpson [facility] 10/15/2012, 4:44 PM

## 2012-10-15 NOTE — ED Provider Notes (Signed)
History     CSN: [id number]  Arrival date & time 10/15/12  0620   None     Chief Complaint  Patient presents with  . Chest Pain    (Consider location/radiation/quality/duration/timing/severity/associated sxs/prior treatment) Patient is a 78 y.o. male presenting with chest pain. The history is provided by the patient and the spouse. No language interpreter was used.  Chest Pain The chest pain began 3 - 5 days ago. Chest pain occurs constantly. The chest pain is unchanged. At its most intense, the pain is at 7/10. The pain is currently at 9/10. The quality of the pain is described as dull. The pain radiates to the left shoulder. Primary symptoms include shortness of breath and cough. Pertinent negatives for primary symptoms include no fever, no wheezing, no palpitations, no nausea, no vomiting and no dizziness.  Pertinent negatives for associated symptoms include no lower extremity edema and no near-syncope. He tried nothing for the symptoms. Risk factors include male gender.  His past medical history is significant for CAD, cancer, diabetes, hyperlipidemia, hypertension and MI.  Pertinent negatives for past medical history include no aneurysm, no anxiety/panic attacks, no aortic aneurysm, no aortic dissection, no arrhythmia, no congenital heart disease, no COPD, no CHF, no DVT, no mitral valve prolapse, no pacemaker, no PE, no PVD, no recent injury, no sleep apnea, no strokes and no thyroid problem.  Procedure history is positive for cardiac catheterization.    78 year old male patient of Dr. Corlis Hove coming in with shortness of breath and chest pain x3 days that is constant to the left chest radiating to the left arm intermittently. Patient is a type II diabetic. States that at 3 AM it woke him and he could not go back to sleep and his shortness of breath was worse. Patient was a STEMI on 08/06/12 with a blockage to his LAD and has a stent. States that the pain is unlike the chest pain he was  having when he had a STEMI. States that he went to  cardiac rehabilitation on Friday and had to stop because of the pain. Patient has a past medical history of diabetes hyperlipidemia, hypertension, chronic renal insufficiency, and multiple surgeries. Patient is not a smoker.  Past Medical History  Diagnosis Date  . Hypertension   . Diabetes mellitus 1994    dm edu 2012  . Hyperlipidemia   . Osteoarthritis   . CRI (chronic renal insufficiency)     baseline Cr seems to be 1.7  . Urinary incontinence     s/p PTNS didn't help  . Glaucoma(365)     and cataracts  . History of melanoma   . CAD (coronary artery disease) 07/2012    acute STEMI, mid LAD PCI - DES  . Thrombocytopenia   . BCC (basal cell carcinoma of skin)     L neck (Dr. Sherrye Payor)    Past Surgical History  Procedure Date  . Replacement total knee 04/2010    RIGHT KNEE  . Tonsillectomy   . Cardiovascular stress test 04/27/2010    EF 75%, nuclear stress test with normal perfusion, no ischemia  . Foot surgery     metal pin in place  . Shoulder surgery   . Finger surgery     amputated finger  . Lexiscan myoview 10/2011    negative for ischemia  . Cataract extraction 12/12, 1/13    bilateral  . Coronary stent placement 07/2012    DES to mid LAD for STEMI    Family History  Problem Relation Age of Onset  . Stroke Mother     hemorrhage  . Diabetes Mother   . Cancer Father     lung  . Diabetes Brother     History  Substance Use Topics  . Smoking status: Never Smoker   . Smokeless tobacco: Never Used  . Alcohol Use: No      Review of Systems  Constitutional: Negative.  Negative for fever.  HENT: Negative.   Eyes: Negative.   Respiratory: Positive for cough and shortness of breath. Negative for wheezing.   Cardiovascular: Positive for chest pain. Negative for palpitations and near-syncope.  Gastrointestinal: Negative.  Negative for nausea and vomiting.  Neurological: Negative.  Negative for dizziness.    Psychiatric/Behavioral: Negative.   All other systems reviewed and are negative.    Allergies  Januvia; Metformin and related; and Penicillins  Home Medications   Current Outpatient Rx  Name  Route  Sig  Dispense  Refill  . AMLODIPINE BESYLATE 5 MG PO TABS      TAKE 1 TABLET BY MOUTH EVERY DAY   90 tablet   3   . ASPIRIN 81 MG PO TABS   Oral   Take 1 tablet (81 mg total) by mouth daily.   30 tablet      . GLIPIZIDE 10 MG PO TABS   Oral   Take 1 tablet (10 mg total) by mouth daily.   90 tablet   3   . INSULIN GLARGINE 100 UNIT/ML [place] SOLN   Subcutaneous   Inject 60 Units into the skin every morning. Qs 1 month   30 mL   3   . INSULIN LISPRO (HUMAN) 100 UNIT/ML [place] SOLN   Subcutaneous   Inject 5-15 Units into the skin 3 (three) times daily before meals. Sliding scale:  150-200 2 units, 201-250 4 units, 251-300 6 units, 301-350 8 units, >350 10 units, >450 15 units         . NITROGLYCERIN 0.4 MG SL SUBL   Sublingual   Place 1 tablet (0.4 mg total) under the tongue every 5 (five) minutes as needed for chest pain. Up to three doses,if pain continues call 911   25 tablet   3   . ROSUVASTATIN CALCIUM 40 MG PO TABS   Oral   Take 1 tablet (40 mg total) by mouth daily.   30 tablet   11   . TAMSULOSIN HCL 0.4 MG PO CAPS   Oral   Take 1 capsule (0.4 mg total) by mouth daily.   90 capsule   3   . TICAGRELOR 90 MG PO TABS   Oral   Take 1 tablet (90 mg total) by mouth 2 (two) times daily.   60 tablet   0     BP 157/80  Pulse 62  Temp 98 F (36.7 C) (Oral)  Resp 18  SpO2 100%  Physical Exam  Nursing note and vitals reviewed. Constitutional: He is oriented to person, place, and time. He appears well-developed and well-nourished.  HENT:  Head: Normocephalic.  Eyes: Conjunctivae normal and EOM are normal. Pupils are equal, round, and reactive to light.  Neck: Normal range of motion. Neck supple.  Cardiovascular: Normal rate.   Pulmonary/Chest: Effort  normal. No respiratory distress. He has decreased breath sounds in the left lower field. He has no wheezes.  Abdominal: Soft.  Musculoskeletal: Normal range of motion.  Neurological: He is alert and oriented to person, place, and time.  Skin: Skin is  warm and dry.  Psychiatric: He has a normal mood and affect.    ED Course  Procedures (including critical care time) Spoke with [facility] cardiology and they will see patient in the ER.  Chest pain went from 7-5 after 2 mg of morphine.  Chest pain went from 6-5 after 1 sl ntg.      Labs Reviewed  CBC WITH DIFFERENTIAL  COMPREHENSIVE METABOLIC PANEL  PRO B NATRIURETIC PEPTIDE   No results found.   No diagnosis found.    MDM  78 year old male coming in with constant left chest pain x3 days with a recent STEMI 2 months ago. Pain was mildly relieved 7 to a 6 after 2 morphine. After sublingual nitroglycerin the pain went from a 04/29/2004. Patient will be admitted by the [facility] will see them in the ER today. Troponin negative x1. EKG unchanged. Chest x-ray which was reviewed by myself shows no acute process. Patient and wife understand the plan and agree with it.   Labs Reviewed  CBC WITH DIFFERENTIAL - Abnormal; Notable for the following:    Neutrophils Relative 41 (*)     Lymphocytes Relative 47 (*)     All other components within normal limits  COMPREHENSIVE METABOLIC PANEL - Abnormal; Notable for the following:    Glucose, Bld 170 (*)     Creatinine, Ser 1.55 (*)     GFR calc non Af Amer 41 (*)     GFR calc Af Amer 47 (*)     All other components within normal limits  PRO B NATRIURETIC PEPTIDE - Abnormal; Notable for the following:    Pro B Natriuretic peptide (BNP) 778.0 (*)     All other components within normal limits  PROTIME-INR  POCT I-STAT TROPONIN I         Date: 10/15/2012  Rate: 79  Rhythm: normal sinus rhythm  QRS Axis: left  Intervals: normal  ST/T Wave abnormalities: normal  Conduction  Disutrbances:first-degree A-V block   Narrative Interpretation: LVH anterior Q waves   Old EKG Reviewed: changes          Julieta Bellini, NP 10/15/12 [id number]

## 2012-10-15 NOTE — Progress Notes (Signed)
Called to room due to increased agitation.  Wife, nursing and sitter present. Pt sitting on edge of bed demanding to leave and making unintelligible requests though cadence of speech is normal.  CBG 160s. Chem7 from this AM wnl. Cath site in right wrist C/D/I. 157/75, 64. Neuro exam demonstrates oriented to name and clearly identifies wife. Not oriented to place or year. Cranial nerves intact (best I could exam, cooperation limited). No focal weakness on exam.  Delerium, likely secondary to benzos received from cath. Doubt stroke based upon physical exam though it could be very focal lacunar. Noted that he already on aspirin, ticagrelor.   Able to re-direct with assistance of his wife. PRN Haldol IV/IM available if he urgently becomes uncooperative but otherwise, with continue with sitter. Discussed plan with wife who is agreeable.  If symptoms persist, will proceed with further evaluation (Head CT, etc.)

## 2012-10-15 NOTE — H&P (Signed)
History and Physical   Patient ID: JEANETTE MOFFATT MRN: [id number], DOB/AGE: Dec 08, 1930   Admit date: 10/15/2012 Date of Consult: 10/15/2012   Primary Physician: [name], MD Primary Cardiologist: Darlin Coco, MD  HPI: Mr. Champine is a 78yo male with an PMHx including CAD (STEMI 07/2012 s/p DES-mid LAD), type 2 DM, HTN, HLD, CKD (baseline Cr 1.7-1.8), chronic thrombocytopenia and OA who presents to the ED with several days of worsening chest pain.   07/2012 echo: EF 45%, moderate LVH, apical HK, mild TR. 07/2012 cath: total mid LAD occlusion distal to septal perforator, prox 30% int branch and prox 30% OM stenoses, mild prox and mid RCA plaques; s/p DES-mid LAD  He last saw Dr. Mare Ferrari on the 14th of this month. He denied ischemic, arrhythmic or heart failure-type symptoms. He reported medication compliance and had been completing cardiac rehab without incident. The following day, he attempted to exert himself at cardiac rehab, but developed constant, left-sided, dull chest pain radiating to his left shoulder w/ assoc SOB which limited his activity level. He was advised to rest. Through the weekend, the discomfort persisted with intermittent worsening (8-9/10). He did not attempt relief with NTG. He reports medication compliance. He does note occasional PND. He reports some LEE. No lightheadedness, diaphoresis, n/v/d, fevers, chills, orthopnea, extended travel or active bleeding. These episodes increased and frequency and intensity thus prompting his ED presentation.   There, EKG revealed no ischemic changes. Initial trop-I WNL. BMET reveals Cr 1.55. CBC indicates PLT 164K. pBNP very mildly elevated at 778.0. CXR reveals interstitial prominence/indistinctness d/t vascular crowding from low lung volumes vs mild edema. He received NTG with some relief. Intermittent bradycardia w/ HR to high 40s, asymptomatic. Otherwise VSS. Currently pain free in NAD.   Problem List: Past Medical  History  Diagnosis Date  . Hypertension   . Diabetes mellitus 1994    dm edu 2012  . Hyperlipidemia   . Osteoarthritis   . CRI (chronic renal insufficiency)     baseline Cr seems to be 1.7  . Urinary incontinence     s/p PTNS didn't help  . Glaucoma(365)     and cataracts  . History of melanoma   . CAD (coronary artery disease) 07/2012    acute STEMI, mid LAD PCI - DES  . Thrombocytopenia   . BCC (basal cell carcinoma of skin)     L neck (Dr. Sherrye Payor)    Past Surgical History  Procedure Date  . Replacement total knee 04/2010    RIGHT KNEE  . Tonsillectomy   . Cardiovascular stress test 04/27/2010    EF 75%, nuclear stress test with normal perfusion, no ischemia  . Foot surgery     metal pin in place  . Shoulder surgery   . Finger surgery     amputated finger  . Lexiscan myoview 10/2011    negative for ischemia  . Cataract extraction 12/12, 1/13    bilateral  . Coronary stent placement 07/2012    DES to mid LAD for STEMI     Allergies:  Allergies  Allergen Reactions  . Januvia (Sitagliptin Phosphate) Other (See Comments)    Possibly affected kidneys?  . Metformin And Related Other (See Comments)    Affected kidneys  . Penicillins Other (See Comments)    Reaction long time ago - doesn't remember    Home Medications: Prior to Admission medications   Medication Sig Start Date End Date Taking? Authorizing Provider  amLODipine (NORVASC) 5 MG tablet  TAKE 1 TABLET BY MOUTH EVERY DAY 09/15/12  Yes Darlin Coco, MD  aspirin 81 MG tablet Take 1 tablet (81 mg total) by mouth daily. 08/09/12 08/09/13 Yes Rhonda G Barrett, PA  glipiZIDE (GLUCOTROL) 10 MG tablet Take 1 tablet (10 mg total) by mouth daily. 08/13/12  Yes Darlin Coco, MD  insulin glargine (LANTUS) 100 UNIT/ML injection Inject 60 Units into the skin every morning. Qs 1 month 09/30/11  Yes [name], MD  insulin lispro (HUMALOG) 100 UNIT/ML injection Inject 5-15 Units into the skin 3 (three) times daily  before meals. Sliding scale:  150-200 2 units, 201-250 4 units, 251-300 6 units, 301-350 8 units, >350 10 units, >450 15 units   Yes Historical Provider, MD  rosuvastatin (CRESTOR) 40 MG tablet Take 1 tablet (40 mg total) by mouth daily. 08/09/12 08/09/13 Yes Rhonda G Barrett, PA  Tamsulosin HCl (FLOMAX) 0.4 MG CAPS Take 1 capsule (0.4 mg total) by mouth daily. 09/16/11  Yes Darlin Coco, MD  Ticagrelor (BRILINTA) 90 MG TABS tablet Take 1 tablet (90 mg total) by mouth 2 (two) times daily. 08/09/12  Yes Rhonda G Barrett, PA  nitroGLYCERIN (NITROSTAT) 0.4 MG SL tablet Place 1 tablet (0.4 mg total) under the tongue every 5 (five) minutes as needed for chest pain. Up to three doses,if pain continues call 911 08/09/12 08/09/13  Lonn Georgia, PA    Inpatient Medications:     . [COMPLETED] aspirin  324 mg Oral Once  . [COMPLETED]  morphine injection  2 mg Intravenous Once    (Not in a hospital admission)  Family History  Problem Relation Age of Onset  . Stroke Mother     hemorrhage  . Diabetes Mother   . Cancer Father     lung  . Diabetes Brother      History   Social History  . Marital Status: Married    Spouse Name: N/A    Number of Children: N/A  . Years of Education: N/A   Occupational History  . Not on file.   Social History Main Topics  . Smoking status: Never Smoker   . Smokeless tobacco: Never Used  . Alcohol Use: No  . Drug Use: No  . Sexually Active: Not Currently   Other Topics Concern  . Not on file   Social History Narrative   Caffeine: 2-3 caffeinated drinks/dayLives with wife, no petsOccupation: retired, used to work cigarette factoryActivity: works around houseDiet: healthy overall.  Good fruits and vegetables, good amt water    Review of Systems: General: negative for chills, fever, night sweats or weight changes.  Cardiovascular: positive for chest pain, SOB, PND and LEE, dyspnea on exertion, orthopnea, palpitations Dermatological: negative for  rash Respiratory: negative for cough or wheezing Urologic:  negative for hematuria Abdominal: negative for nausea, vomiting, diarrhea, bright red blood per rectum, melena, or hematemesis Neurologic:  negative for visual changes, syncope, or dizziness All other systems reviewed and are otherwise negative except as noted above.  Physical Exam: Blood pressure 145/74, pulse 55, temperature 98 F (36.7 C), temperature source Oral, resp. rate 15, SpO2 96.00%.    General: Well developed, well nourished, in no acute distress. Head: Normocephalic, atraumatic, sclera non-icteric, no xanthomas, nares are without discharge.  Neck:  Negative for carotid bruits. JVD not elevated. Lungs: Trace rhonchi centrally. No wheezes or rales appreciated. Breathing is unlabored. Heart: RRR with S1 S2. No murmurs, rubs, or gallops appreciated.  No chest wall tenderness. Abdomen:  Soft, non-tender, non-distended with  normoactive bowel sounds. No hepatomegaly. No rebound/guarding. No obvious abdominal masses. Msk:  Strength and tone appears normal for age. Extremities: 1+ pitting pedal/pretibial edema bilaterally. No clubbing, cyanosis or edema.  Distal pedal pulses are 2+ and equal bilaterally. Neuro: Alert and oriented X 3. Moves all extremities spontaneously. Psych:  Responds to questions appropriately with a normal affect.  Labs: Recent Labs  [facility] 10/15/12 0628   WBC 6.3   HGB 15.1   HCT 43.4   MCV 89.7   PLT 164   Lab 10/15/12 0628  NA 139  K 3.7  CL 105  CO2 23  BUN 22  CREATININE 1.55*  CALCIUM 9.3  PROT 7.2  BILITOT 0.6  ALKPHOS 42  ALT 24  AST 23  AMYLASE --  LIPASE --  GLUCOSE 170*   Radiology/Studies: Dg Chest Port 1 View  10/15/2012  *RADIOLOGY REPORT*  Clinical Data: Shortness of breath and chest pain.  PORTABLE CHEST - 1 VIEW  Comparison: 11/30/2004.  Findings: Trachea is midline.  Heart size is accentuated by low lung volumes and AP semi upright technique.  Mild interstitial  prominence and indistinctness bilaterally.  No definite pleural fluid.  IMPRESSION: Interstitial prominence and indistinctness may be due to vascular crowding related to low lung volumes.  Mild edema is another consideration.   Original Report Authenticated By: Lorin Picket, M.D.    EKG: NSR, 1st degree AVB, poor R wave progression, ST depression w/ TWI I, aVL (unchanged)  ASSESSMENT AND PLAN:   1. CAD/unstable angina pectoris- patient reports experiencing dull, left-sided chest discomfort radiating to his left shoulder with associated shortness of breath increasing in frequency and duration, and exacerbated by activity, since following up with Dr. Mare Ferrari last week. He has a history of CAD having recently underwent DES-mid LAD in the setting of a STEMI. He is also a diabetic, which raises his risk of ISR. ED EKG and troponin reveal no evidence of acute ischemia. As DES was placed <6 months ago and he has a high pretest probability, would favor diagnostic cardiac cath to evaluate for stent patency. Should no obstructing etiology be found, have discussed utility of NTG SL PRN for anginal attacks, and consideration of long-acting nitrate/ranolazine. BB deferred in the setting of intermittent, asymptomatic sinus bradycardia. Will need to determine timing of cath. Clinically, he would be able to lay flat for 1-2 hours. Could cath today and gently diurese overnight. Renal function is optimized currently, but may reduce somewhat with overnight diuresis. Patient has been fasting. Of note, contrast will need to be used sparingly. Continue ASA/Brilinta, statin, NTG SL PRN.   2. Acute on chronic systolic CHF- patient reports bilateral lower extremity edema (chronicity unknown) and PND. On exam, trace centralized pulmonary rhonchi noted. Bilateral 1+ pretibial edema apparently, however no JVD. pBNP is mildly elevated and CXR does indicate questionable mild edema. Of note, patient is on Norvasc, however there  appears to be a component of CHF. Will plan for gentle diuresis. CHF exacerbation further supports decision for cath.   3. Type 2 DM- SSI while inpatient. Continue home Lantus dosing.   4. CKD, stage IIIb- Cr today actually better than baseline. This may be attributed to mild hypervolemia from CHF. Will need to avoid aggressive diuresis and liberal contrast use.   5. HTN- continue Norvasc for now. May need to up-titrate if further control needed. BB deferred due to intermittent sinus bradycardia and ACEi/ARB due to renal insufficiency.   6. Hyperlipidemia- continue statin.   7. Chronic  thrombocytopenia- actually WNL on today's CBC. Will need to monitor post-cath.    Signed, R. Valeria Batman, PA-C 10/15/2012, 9:23 AM    Attending Note:   The patient was seen and examined.  Agree with assessment and plan as noted above.  Changes to the H&P was made as necessary.    He presents with CP - very similar to his  MI / angina pain.  Relieved with NTG here in the ER.   ECG show some TWI in the lateral leads but this is old.   I think we should proceed with cardiac cath.  His symptoms are similar to his previous angina.   Will give her some extra Kdur . OK to give some juice.  Would give 1/2 dose Lantus insulin. Check renal function tomorrow.   Thayer Headings, Brooke Bonito., MD, [facility] 10/15/2012, 11:10 AM

## 2012-10-15 NOTE — ED Notes (Signed)
Report received, assumed care.

## 2012-10-15 NOTE — Telephone Encounter (Signed)
Patient was taken to [facility] ER this morning at 6:00.  He was having chest pain.  Pt is going to have a catheterization and he'll be admitted.

## 2012-10-15 NOTE — ED Notes (Signed)
Pt states that he did not fall asleep and has been up since 03:00 with CP. Pt states that the pain is left chest and goes down his left arm. Pt states that the pain does increase with breathing. pt states STENT 1 month ago and that he hasn't had pain since.

## 2012-10-15 NOTE — ED Provider Notes (Signed)
ED ECG REPORT  I personally interpreted this EKG   Date: 10/15/2012   Rate: 67  Rhythm: normal sinus rhythm  QRS Axis: left  Intervals: PR prolonged  ST/T Wave abnormalities: nonspecific ST/T changes  Conduction Disutrbances:first-degree A-V block   Narrative Interpretation:   Old EKG Reviewed: Compared with 10/11/2012, no significant changes are seen   Johnna Acosta, MD 10/15/12 [phone]

## 2012-10-15 NOTE — ED Provider Notes (Signed)
78 year old male with hypertension, diabetes and a recent ST elevation MI 2 months ago who presents with a complaint of 2 days of left-sided sharp chest pain. This is not related to deep breathing nor is it positional or exertional. He has been waking up at night with significant pain that causes him to wake up and get up out of bed to walk around. Nothing seems to improve the pain, it is associated with shortness of breath but no swelling of the legs, trauma, immobilization, travel. He has had no fevers and has only had a sporadic intermittent cough. There is no radiation to the arm. He states this does not feel similar to the pain that he had with his heart attack 2 months ago.  Exam shows a patient in no acute distress with no edema of the legs, soft abdomen, clear lungs without any rales or decreased sounds, no murmur and strong peripheral pulses. His EKG does not show any signs of acute ischemia, see my separate note. Troponin ordered, anticipate cardiology consultation.  Medical screening examination/treatment/procedure(s) were conducted as a shared visit with non-physician practitioner(s) and myself.  I personally evaluated the patient during the encounter    Johnna Acosta, MD 10/15/12 [phone]

## 2012-10-16 ENCOUNTER — Encounter (HOSPITAL_COMMUNITY): Payer: Self-pay | Admitting: General Practice

## 2012-10-16 ENCOUNTER — Ambulatory Visit: Payer: Medicare Other | Admitting: Family Medicine

## 2012-10-16 ENCOUNTER — Observation Stay (HOSPITAL_COMMUNITY): Payer: Medicare Other

## 2012-10-16 DIAGNOSIS — R079 Chest pain, unspecified: Secondary | ICD-10-CM | POA: Diagnosis not present

## 2012-10-16 DIAGNOSIS — F29 Unspecified psychosis not due to a substance or known physiological condition: Secondary | ICD-10-CM | POA: Diagnosis not present

## 2012-10-16 LAB — BASIC METABOLIC PANEL
Chloride: 106 mEq/L (ref 96–112)
GFR calc non Af Amer: 39 mL/min — ABNORMAL LOW (ref >90)
Glucose, Bld: 135 mg/dL — ABNORMAL HIGH (ref 70–99)
Potassium: 3.9 mEq/L (ref 3.5–5.1)
Sodium: 140 mEq/L (ref 135–145)

## 2012-10-16 LAB — CBC
Hemoglobin: 14.2 g/dL (ref 13.0–17.0)
MCHC: 35.4 g/dL (ref 30.0–36.0)
RBC: 4.47 MIL/uL (ref 4.22–5.81)
WBC: 8.1 10*3/uL (ref 4.0–10.5)

## 2012-10-16 LAB — GLUCOSE, CAPILLARY: Glucose-Capillary: 175 mg/dL — ABNORMAL HIGH (ref 70–99)

## 2012-10-16 MED ORDER — ISOSORBIDE MONONITRATE ER 30 MG PO TB24: 30.0000 mg | ORAL_TABLET | Freq: Every day | ORAL | Status: DC

## 2012-10-16 MED ORDER — INSULIN ASPART 100 UNIT/ML ~~LOC~~ SOLN
2.0000 [IU] | Freq: Once | SUBCUTANEOUS | Status: AC
  Administered 2012-10-16: 14:00:00 2 [IU] via SUBCUTANEOUS

## 2012-10-16 MED ORDER — ISOSORBIDE MONONITRATE ER 30 MG PO TB24
30.0000 mg | ORAL_TABLET | Freq: Every day | ORAL | Status: DC
  Administered 2012-10-16: 11:00:00 30 mg via ORAL
  Filled 2012-10-16: qty 1

## 2012-10-16 MED ORDER — TICAGRELOR 90 MG PO TABS: 90.0000 mg | ORAL_TABLET | Freq: Two times a day (BID) | ORAL | Status: DC

## 2012-10-16 NOTE — Progress Notes (Signed)
Subjective:  The patient is still confused this am. Does not know where he is. Knows his name. Recognizes me and his wife. No headache.  Less agitated than during the night.  Objective:  Vital Signs in the last 24 hours: Temp:  [97.2 F (36.2 C)-99.4 F (37.4 C)] 99.4 F (37.4 C) (11/19 0345) Pulse Rate:  [47-80] 80  (11/19 0345) Resp:  [9-17] 16  (11/19 0345) BP: (108-158)/(57-83) 114/58 mmHg (11/19 0345) SpO2:  [92 %-99 %] 92 % (11/19 0345) Weight:  [206 lb 9.6 oz (93.713 kg)-210 lb 1.6 oz (95.3 kg)] 210 lb 1.6 oz (95.3 kg) (11/19 0009)  Intake/Output from previous day: 11/18 0701 - 11/19 0700 In: 118 [P.O.:118] Out: -  Intake/Output from this shift:       . amLODipine  5 mg Oral Daily  . aspirin EC  81 mg Oral Daily  . atorvastatin  80 mg Oral q1800  . [COMPLETED] diazepam      . [COMPLETED] diazepam  5 mg Oral On Call  . haloperidol lactate  1 mg Intravenous Once  . [COMPLETED] heparin      . insulin aspart  5-15 Units Subcutaneous TID WC  . insulin glargine  60 Units Subcutaneous Daily  . isosorbide mononitrate  30 mg Oral Daily  . [COMPLETED] lidocaine      . [COMPLETED] midazolam      . [COMPLETED] nitroGLYCERIN      . [COMPLETED] potassium chloride  20 mEq Oral Once  . sodium chloride  3 mL Intravenous Q12H  . Tamsulosin HCl  0.4 mg Oral Daily  . Ticagrelor  90 mg Oral BID  . [COMPLETED] verapamil      . [DISCONTINUED] aspirin  81 mg Oral Daily  . [DISCONTINUED] furosemide  40 mg Oral Once  . [DISCONTINUED] insulin glargine  60 Units Subcutaneous BH-q7a  . [DISCONTINUED] isosorbide mononitrate  60 mg Oral Daily  . [DISCONTINUED] sodium chloride  3 mL Intravenous Q12H      . [EXPIRED] sodium chloride 1 mL/kg/hr (10/15/12 1700)  . [DISCONTINUED] sodium chloride 50 mL/hr at 10/15/12 1144    Physical Exam: The patient appears to be in no distress.  Head and neck exam reveals that the pupils are equal and reactive.  The extraocular movements are full.   There is no scleral icterus.  Mouth and pharynx are benign.  No lymphadenopathy.  No carotid bruits.  The jugular venous pressure is normal.  Thyroid is not enlarged or tender.  Chest is clear to percussion and auscultation.  No rales or rhonchi.  Expansion of the chest is symmetrical.  Heart reveals no abnormal lift or heave.  First and second heart sounds are normal.  There is no murmur gallop rub or click.  The abdomen is soft and nontender.  Bowel sounds are normoactive.  There is no hepatosplenomegaly or mass.  There are no abdominal bruits.  Extremities reveal no phlebitis or edema.  Pedal pulses are good.  There is no cyanosis or clubbing. Radial dressing intact.  Neurologic exam is normal strength and no lateralizing weakness.  No sensory deficits. Oriented x1  Integument reveals no rash  Lab Results:  Basename 10/16/12 0630 10/15/12 0628  WBC 8.1 6.3  HGB 14.2 15.1  PLT 159 164    Basename 10/16/12 0630 10/15/12 0628  NA 140 139  K 3.9 3.7  CL 106 105  CO2 24 23  GLUCOSE 135* 170*  BUN 18 22  CREATININE 1.60* 1.55*  Basename 10/15/12 2107 10/15/12 1526  TROPONINI <0.30 <0.30   Hepatic Function Panel  Basename 10/15/12 0628  PROT 7.2  ALBUMIN 3.6  AST 23  ALT 24  ALKPHOS 42  BILITOT 0.6  BILIDIR --  IBILI --   No results found for this basename: CHOL in the last 72 hours No results found for this basename: PROTIME in the last 72 hours  Imaging: Dg Chest Port 1 View  10/15/2012  *RADIOLOGY REPORT*  Clinical Data: Shortness of breath and chest pain.  PORTABLE CHEST - 1 VIEW  Comparison: 11/30/2004.  Findings: Trachea is midline.  Heart size is accentuated by low lung volumes and AP semi upright technique.  Mild interstitial prominence and indistinctness bilaterally.  No definite pleural fluid.  IMPRESSION: Interstitial prominence and indistinctness may be due to vascular crowding related to low lung volumes.  Mild edema is another consideration.   Original  Report Authenticated By: Lorin Picket, M.D.     Cardiac Studies: Telemetry NSR Assessment/Plan:  Patient Active Hospital Problem List: Post-cath delirium     Assessment: possibly secondary to valium at cath     Plan: Will get Head CT.  He seems to be gradually improving this am.              Should be able to go home later today if mentation continues to improve. CAD (coronary artery disease) (10/15/2012)   Assessment: Cath shows 2 vessel obstructive disease. No intervention indicated.   Plan: Continue medical therapy. Add imdur.    LOS: 1 day    Darlin Coco 10/16/2012, 8:19 AM

## 2012-10-16 NOTE — Discharge Summary (Signed)
Discharge Summary   Patient ID: LOC FEINSTEIN MRN: [id number], DOB/AGE: 76/12/1930 76 y.o.  Primary MD: [name], MD Primary Cardiologist: Darlin Coco, MD Admit date: 10/15/2012 D/C date:     10/16/2012      Primary Discharge Diagnoses:  1. Coronary Artery Disease  - STEMI 07/2012 s/p DES to mid LAD  - Cath 11/18 showed patent LAD stent, two vessel obstructive dz, not hemodynamically significant, medical therapy (initiated on Imdur)  2. Acute Delirium, Resolved  - Head CT negative for acute intracranial abnormalities  - Likely 2/2 to Benzo administration  Secondary Discharge Diagnoses:  Past Medical History  Diagnosis Date  . Hypertension   . Diabetes mellitus 1994    dm edu 2012  . Hyperlipidemia   . Osteoarthritis   . CRI (chronic renal insufficiency)     baseline Cr seems to be 1.7-1.8  . Urinary incontinence     s/p PTNS didn't help  . Glaucoma(365)     and cataracts  . History of melanoma   . Thrombocytopenia   . BCC (basal cell carcinoma of skin)     L neck (Dr. Sherrye Payor)  . Complication of anesthesia     confused after cath 10/15/2012     Allergies Allergies  Allergen Reactions  . Januvia (Sitagliptin Phosphate) Other (See Comments)    Possibly affected kidneys?  . Metformin And Related Other (See Comments)    Affected kidneys  . Penicillins Other (See Comments)    Reaction long time ago - doesn't remember    Diagnostic Studies/Procedures:   10/15/12 - Cardiac Cath Hemodynamics:  AO 160/72 with a mean of 106 mmHg  LV 159/18 mmHg  Coronary angiography:  Coronary dominance: right  Left mainstem: There is eccentric plaque in the distal left main. In the LAO caudal view this appears to be about 70%. In the RAO views the stenosis appears less.  Left anterior descending (LAD): The LAD is widely patent throughout the stented segment. In the far distal LAD there is a focal 80-90% stenosis.  There is a moderate size ramus intermediate  branch. It has a 40-50% stenosis proximally.  Left circumflex (LCx): The left circumflex is a small vessel given rise to a single marginal branch. There is a 70% stenosis in the proximal marginal branch.  Right coronary artery (RCA): The right coronary is a dominant vessel. It has moderate plaque is less than 10%.  Left ventriculography: Left ventricular systolic function is normal, LVEF is estimated at 55%, there is minimal anterior apical hypokinesis. There is no significant mitral insufficiency.  Given the intermediate nature of the distal left main stenosis we elected to evaluate further with intravascular ultrasound. We exchanged for a 6 French arterial sheath. Patient received IV heparin to achieve a therapeutic ACT of 309 seconds. A 6 French XB LAD 3.5 guide was used. A pro-water guidewire was used to cross the left main into the LAD. We then performed intravascular ultrasound of the proximal LAD and left main. This demonstrated eccentric calcified plaque in the left main. The minimal lumen area was 7 mm square. This is consistent with a lesion that was not hemodynamically significant.  Final Conclusions:  1. Two-vessel obstructive coronary disease. There is a moderate-severe stenosis in the far distal LAD. There is a modest lesion in a small left circumflex artery. The left main coronary has moderate distal disease. By intravascular ultrasound is does not appear to be of hemodynamic significance. The stent in the LAD is widely patent.  2. Good left ventricular systolic function.  Recommendations: Would recommend continue medical therapy with aspirin, Brilinta, and Norvasc. We will add a long-acting nitrate. He is not a candidate for beta blockers due to to bradycardia.    10/16/2012 - CT HEAD WITHOUT CONTRAST  Findings: No intracranial hemorrhage.  Small vessel disease type changes.  Infarct/ischemia anterior limb of the left internal capsule, age indeterminate.  No hydrocephalus.  Vascular  calcifications.  No intracranial mass lesion detected on this unenhanced exam.  Minimal paranasal sinus mucosal thickening.  IMPRESSION: No intracranial hemorrhage.  Small vessel disease type changes.  Infarct/ischemia anterior limb of the left internal capsule, age indeterminate.     History of Present Illness: 78 y.o. male w/ the above medical problems who presented to [facility] on 10/15/12 with complaints of chest pain. He reported exertional chest pain that started during cardiac rehab on 11/15 and continued intermittently throughout the weekend prompting him to present to the ED  Hospital Course: In the ED, EKG revealed NSR with no acute ST/T changes. CXR revealed interstitial prominence/indistinctness d/t vascular crowding from low lung volumes vs mild edema. Labs were significant for normal troponin, pBNP 778, Cr 1.55, Plt 164. He received some relief with NTG. Given his cardiac history and symptoms concerning for unstable angina he was placed in observation with plans for cardiac cath. Cardiac enzymes were cycled and remained negative. Cath revealed patent LAD stent, two vessel obstructive coronary disease with mod-severe distal LAD stenosis and mod distal LM disease not found to be hemodynamically significant by IVUS, EF 55%. He tolerated the procedure well without complications. Recommendations were made for continued medical therapy with the addition of Imdur. He was not a candidate for BB due to bradycardia. Post cath he developed acute confusion which was felt due to valium/versed administration and resolved on day of discharge. No focal neuro deficits. Head CT was without acute intracranial findings. He was able to ambulate without chest pain. Cath site remained stable. He was seen and evaluated by Dr. Mare Ferrari who felt he was stable for discharge home with plans for follow up as scheduled below.  Discharge Vitals: Blood pressure 132/64, pulse 63, temperature 98 F (36.7 C),  temperature source Oral, resp. rate 13, height 6' (1.829 m), weight 210 lb 1.6 oz (95.3 kg), SpO2 94.00%.  Labs: Component Value Date   WBC 8.1 10/16/2012   HGB 14.2 10/16/2012   HCT 40.1 10/16/2012   MCV 89.7 10/16/2012   PLT 159 10/16/2012    Lab 10/16/12 0630 10/15/12 0628  NA 140 --  K 3.9 --  CL 106 --  CO2 24 --  BUN 18 --  CREATININE 1.60* --  CALCIUM 9.0 --  PROT -- 7.2  BILITOT -- 0.6  ALKPHOS -- 42  ALT -- 24  AST -- 23  GLUCOSE 135* --   Basename 10/15/12 2107 10/15/12 1526  TROPONINI <0.30 <0.30     10/15/2012 06:28  Pro B Natriuretic peptide (BNP) 778.0 (H)     10/15/2012 14:58  TSH 4.946 (H)     Discharge Medications     Medication List     As of 10/16/2012  3:51 PM    TAKE these medications         amLODipine 5 MG tablet   Commonly known as: NORVASC   TAKE 1 TABLET BY MOUTH EVERY DAY      aspirin 81 MG tablet   Take 1 tablet (81 mg total) by mouth daily.  glipiZIDE 10 MG tablet   Commonly known as: GLUCOTROL   Take 1 tablet (10 mg total) by mouth daily.      insulin glargine 100 UNIT/ML injection   Commonly known as: LANTUS   Inject 60 Units into the skin every morning. Qs 1 month      insulin lispro 100 UNIT/ML injection   Commonly known as: HUMALOG   Inject 5-15 Units into the skin 3 (three) times daily before meals. Sliding scale:  150-200 2 units, 201-250 4 units, 251-300 6 units, 301-350 8 units, >350 10 units, >450 15 units      isosorbide mononitrate 30 MG 24 hr tablet   Commonly known as: IMDUR   Take 1 tablet (30 mg total) by mouth daily.      nitroGLYCERIN 0.4 MG SL tablet   Commonly known as: NITROSTAT   Place 1 tablet (0.4 mg total) under the tongue every 5 (five) minutes as needed for chest pain. Up to three doses,if pain continues call 911      rosuvastatin 40 MG tablet   Commonly known as: CRESTOR   Take 1 tablet (40 mg total) by mouth daily.      Tamsulosin HCl 0.4 MG Caps   Commonly known as: FLOMAX    Take 1 capsule (0.4 mg total) by mouth daily.      Ticagrelor 90 MG Tabs tablet   Commonly known as: BRILINTA   Take 1 tablet (90 mg total) by mouth 2 (two) times daily.          Disposition   Discharge Orders    Future Appointments: Provider: Department: Dept Phone: Center:   10/29/2012 3:00 PM Burtis Junes, NP [place]) [phone] [facility]   01/16/2013 8:15 AM [facility] [place]) [phone] [facility]   01/16/2013 8:45 AM Darlin Coco, MD [place]) [phone] [facility]     Future Orders Please Complete By Expires   Diet - low sodium heart healthy      Increase activity slowly      Discharge instructions      Comments:   * KEEP WRIST CATHETERIZATION SITE CLEAN AND DRY. Call the office for any signs of bleeding, pus, swelling, increased pain, or any other concerns. * NO HEAVY LIFTING (>10lbs) OR SEXUAL ACTIVITY X 7 DAYS. * NO DRIVING X 2-3 DAYS. * NO SOAKING BATHS, HOT TUBS, POOLS, ETC., X 7 DAYS.     Follow-up Information    Follow up with Truitt Merle, NP. On 10/29/2012. (3:00)    Contact information:   [facility] [place]. [address] [place] [address] [phone]           Outstanding Labs/Studies:  None  Duration of Discharge Encounter: Greater than 30 minutes including physician and PA time.  Signed, Jazia Faraci PA-C 10/16/2012, 3:51 PM

## 2012-10-17 ENCOUNTER — Encounter (HOSPITAL_COMMUNITY): Admission: RE | Admit: 2012-10-17 | Payer: Medicare Other | Source: Ambulatory Visit

## 2012-10-17 NOTE — Progress Notes (Signed)
Utilization review completed.  P.J. Raeden Schippers,RN,BSN Case Manager [phone]

## 2012-10-19 ENCOUNTER — Encounter (HOSPITAL_COMMUNITY): Payer: Medicare Other

## 2012-10-19 ENCOUNTER — Telehealth: Payer: Self-pay | Admitting: Cardiology

## 2012-10-19 NOTE — Telephone Encounter (Signed)
Patient just out of the hospital Wednesday and started yesterday with blood in his urine. Did call urologist and he is scheduled to see them Monday but advised for him to call  Dr. Mare Ferrari. Discussed with  Dr. Mare Ferrari and will have patient hold Aspirin over weekend and keep appointment with urologist on Monday.  Go to ED if worse or urgent care if fever develops.  Advised wife

## 2012-10-19 NOTE — Telephone Encounter (Signed)
plz return call to pt [phone] regarding blood in his urine

## 2012-10-22 ENCOUNTER — Encounter (HOSPITAL_COMMUNITY): Payer: Medicare Other

## 2012-10-22 ENCOUNTER — Telehealth (HOSPITAL_COMMUNITY): Payer: Self-pay | Admitting: *Deleted

## 2012-10-22 DIAGNOSIS — R339 Retention of urine, unspecified: Secondary | ICD-10-CM | POA: Diagnosis not present

## 2012-10-22 DIAGNOSIS — R350 Frequency of micturition: Secondary | ICD-10-CM | POA: Diagnosis not present

## 2012-10-22 DIAGNOSIS — R82998 Other abnormal findings in urine: Secondary | ICD-10-CM | POA: Diagnosis not present

## 2012-10-22 DIAGNOSIS — R310 Gross hematuria: Secondary | ICD-10-CM | POA: Diagnosis not present

## 2012-10-22 NOTE — Telephone Encounter (Signed)
Received message from pt's wife.  Pt is going to see urologist because of blood in his urine.  Pt's wife will call when he is able to resume rehab.

## 2012-10-24 ENCOUNTER — Encounter (HOSPITAL_COMMUNITY): Admission: RE | Admit: 2012-10-24 | Payer: Medicare Other | Source: Ambulatory Visit

## 2012-10-29 ENCOUNTER — Encounter: Payer: Self-pay | Admitting: Nurse Practitioner

## 2012-10-29 ENCOUNTER — Encounter (HOSPITAL_COMMUNITY): Payer: Medicare Other

## 2012-10-29 ENCOUNTER — Ambulatory Visit (INDEPENDENT_AMBULATORY_CARE_PROVIDER_SITE_OTHER): Payer: Medicare Other | Admitting: Nurse Practitioner

## 2012-10-29 VITALS — BP 148/88 | HR 76 | Ht 72.0 in | Wt 203.8 lb

## 2012-10-29 DIAGNOSIS — R079 Chest pain, unspecified: Secondary | ICD-10-CM | POA: Diagnosis not present

## 2012-10-29 MED ORDER — ACETAMINOPHEN 500 MG PO TABS: 500.0000 mg | ORAL_TABLET | Freq: Two times a day (BID) | ORAL | Status: DC

## 2012-10-29 NOTE — Progress Notes (Signed)
Frank Simpson Date of Birth: Jan 21, 1931 Medical Record #[id number]  History of Present Illness: Frank Simpson is seen today for a post hospital visit. He is seen for Dr. Mare Ferrari. He has multiple issues which include HTN, Dm, HLD, OA, CRI, urinary incontinence, glaucoma, melanoma, thrombocytopenia, and known CAD with a STEMI in September with DES to the mid LAD.   He was recently hospitalized with recurrent chest pain 2 weeks ago. Repeat cath showed his stent to be patent and with 2 vessel disease that was not felt to be hemodynamically significant and medical therapy was initiated with the addition of Imdur. This hospitalization was complicated by acute delirium from presumed benzodiazepine.   He comes in today. He is here with his wife. He continues to have chest pain. It is pretty constant. May be worse at times with actual movement (twisting, turning, etc. Not exertional. Not having any radiation down his left arm as he did with his STEMI. Using NTG sl and says it "takes a long time to work". Does not see any difference with the Imdur. Not wanting to go back to cardiac rehab. Says they never let him exercise because of his sugar. He is trying to do more at home. Got up some leaves over the weekend.   Current Outpatient Prescriptions on File Prior to Visit  Medication Sig Dispense Refill  . amLODipine (NORVASC) 5 MG tablet TAKE 1 TABLET BY MOUTH EVERY DAY  90 tablet  3  . aspirin 81 MG tablet Take 1 tablet (81 mg total) by mouth daily.  30 tablet    . glipiZIDE (GLUCOTROL) 10 MG tablet Take 1 tablet (10 mg total) by mouth daily.  90 tablet  3  . insulin glargine (LANTUS) 100 UNIT/ML injection Inject 60 Units into the skin every morning. Qs 1 month  30 mL  3  . insulin lispro (HUMALOG) 100 UNIT/ML injection Inject 5-15 Units into the skin 3 (three) times daily before meals. Sliding scale:  150-200 2 units, 201-250 4 units, 251-300 6 units, 301-350 8 units, >350 10 units, >450 15 units      .  isosorbide mononitrate (IMDUR) 30 MG 24 hr tablet Take 1 tablet (30 mg total) by mouth daily.  60 tablet  6  . nitroGLYCERIN (NITROSTAT) 0.4 MG SL tablet Place 1 tablet (0.4 mg total) under the tongue every 5 (five) minutes as needed for chest pain. Up to three doses,if pain continues call 911  25 tablet  3  . rosuvastatin (CRESTOR) 40 MG tablet Take 1 tablet (40 mg total) by mouth daily.  30 tablet  11  . Tamsulosin HCl (FLOMAX) 0.4 MG CAPS Take 1 capsule (0.4 mg total) by mouth daily.  90 capsule  3  . Ticagrelor (BRILINTA) 90 MG TABS tablet Take 1 tablet (90 mg total) by mouth 2 (two) times daily.  60 tablet  6    Allergies  Allergen Reactions  . Januvia (Sitagliptin Phosphate) Other (See Comments)    Possibly affected kidneys?  . Metformin And Related Other (See Comments)    Affected kidneys  . Penicillins Other (See Comments)    Reaction long time ago - doesn't remember    Past Medical History  Diagnosis Date  . Hypertension   . Diabetes mellitus 1994    dm edu 2012  . Hyperlipidemia   . Osteoarthritis   . CRI (chronic renal insufficiency)     baseline Cr seems to be 1.7-1.8  . Urinary incontinence  s/p PTNS didn't help  . Glaucoma(365)     and cataracts  . History of melanoma   . CAD (coronary artery disease)     07/2012 acute STEMI, mid LAD PCI - DES; cath 09/2012 patent LAD stent, non-hemodynamically significant Left Main/LAD disease, EF 55%  . Thrombocytopenia   . BCC (basal cell carcinoma of skin)     L neck (Dr. Sherrye Payor)  . Complication of anesthesia     confused after cath 10/15/2012    Past Surgical History  Procedure Date  . Replacement total knee 04/2010    RIGHT KNEE  . Tonsillectomy   . Cardiovascular stress test 04/27/2010    EF 75%, nuclear stress test with normal perfusion, no ischemia  . Foot surgery     metal pin in place  . Shoulder surgery   . Finger surgery     amputated finger  . Lexiscan myoview 10/2011    negative for ischemia  .  Cataract extraction 12/12, 1/13    bilateral  . Coronary stent placement 07/2012    DES to mid LAD for STEMI  . Cardiac catheterization 10/15/2012    History  Smoking status  . Never Smoker   Smokeless tobacco  . Never Used    History  Alcohol Use No    Family History  Problem Relation Age of Onset  . Stroke Mother     hemorrhage  . Diabetes Mother   . Cancer Father     lung  . Diabetes Brother     Review of Systems: The review of systems is per the HPI.  All other systems were reviewed and are negative.  Physical Exam: BP 148/88  Pulse 76  Ht 6' (1.829 m)  Wt 203 lb 12.8 oz (92.443 kg)  BMI 27.64 kg/m2 Patient is very pleasant and in no acute distress. Skin is warm and dry. Color is normal.  HEENT is unremarkable. Normocephalic/atraumatic. PERRL. Sclera are nonicteric. Neck is supple. No masses. No JVD. Lungs are clear. Cardiac exam shows a regular rate and rhythm. Abdomen is soft. Extremities are without edema. Gait and ROM are intact. No gross neurologic deficits noted.  LABORATORY DATA: Lab Results  Component Value Date   WBC 8.1 10/16/2012   HGB 14.2 10/16/2012   HCT 40.1 10/16/2012   PLT 159 10/16/2012   GLUCOSE 135* 10/16/2012   CHOL 176 08/07/2012   TRIG 278* 08/07/2012   HDL 28* 08/07/2012   LDLDIRECT 72 11/17/2010   LDLCALC 92 08/07/2012   ALT 24 10/15/2012   AST 23 10/15/2012   NA 140 10/16/2012   K 3.9 10/16/2012   CL 106 10/16/2012   CREATININE 1.60* 10/16/2012   BUN 18 10/16/2012   CO2 24 10/16/2012   TSH 4.946* 10/15/2012   INR 1.00 10/15/2012   HGBA1C 8.2* 10/15/2012   MICROALBUR 4.2* 08/11/2011   Cardiac Cath Final Conclusions:   1. Two-vessel obstructive coronary disease. There is a moderate-severe stenosis in the far distal LAD. There is a modest lesion in a small left circumflex artery. The left main coronary has moderate distal disease. By intravascular ultrasound is does not appear to be of hemodynamic significance. The stent in the  LAD is widely patent.  2. Good left ventricular systolic function.   Recommendations: Would recommend continue medical therapy with aspirin, Brilinta, and Norvasc. We will add a long-acting nitrate. He is not a candidate for beta blockers due to to bradycardia.   [name] [facility]  10/15/2012, 4:44 PM   Assessment /  Plan:  Chest pain - has known CAD with prior STEMI/DES to the LAD in September. This was associated with left arm pain. He now has a knife-like sticking kind of pain with no radiation down the arm. Seems fairly constant. Nitrates not helping and may not be cardiac related. Not exertional. He is willing to try some Tylenol. I have discussed his situation with Dr. Mare Ferrari. If his symptoms persist, may need to consider CT scan of the chest. He is not interested in going back to cardiac rehab and I have asked him to be walking at home. He is agreeable.  Patient is agreeable to this plan and will call if any problems develop in the interim.

## 2012-10-29 NOTE — Patient Instructions (Signed)
I want you to take Tylenol 500 mg two times a day  Walk every day  Stay on your current medicines  We will see you back in 2 weeks on a day that Dr. Mare Ferrari is here. If your symptoms persist, we will consider a scan on your chest.  Call the [place] office at [phone] if you have any questions, problems or concerns.

## 2012-10-31 ENCOUNTER — Encounter (HOSPITAL_COMMUNITY): Payer: Medicare Other

## 2012-11-02 ENCOUNTER — Encounter (HOSPITAL_COMMUNITY): Payer: Medicare Other

## 2012-11-05 ENCOUNTER — Encounter: Payer: Self-pay | Admitting: Family Medicine

## 2012-11-05 ENCOUNTER — Ambulatory Visit (INDEPENDENT_AMBULATORY_CARE_PROVIDER_SITE_OTHER): Payer: Medicare Other | Admitting: Family Medicine

## 2012-11-05 ENCOUNTER — Encounter (HOSPITAL_COMMUNITY): Payer: Medicare Other

## 2012-11-05 VITALS — BP 146/80 | HR 64 | Temp 97.5°F | Wt 207.5 lb

## 2012-11-05 DIAGNOSIS — E1149 Type 2 diabetes mellitus with other diabetic neurological complication: Secondary | ICD-10-CM | POA: Diagnosis not present

## 2012-11-05 DIAGNOSIS — E1142 Type 2 diabetes mellitus with diabetic polyneuropathy: Secondary | ICD-10-CM | POA: Diagnosis not present

## 2012-11-05 DIAGNOSIS — E1140 Type 2 diabetes mellitus with diabetic neuropathy, unspecified: Secondary | ICD-10-CM

## 2012-11-05 DIAGNOSIS — I2510 Atherosclerotic heart disease of native coronary artery without angina pectoris: Secondary | ICD-10-CM | POA: Diagnosis not present

## 2012-11-05 MED ORDER — GLUCOSE BLOOD VI STRP: ORAL_STRIP | Status: DC

## 2012-11-05 NOTE — Assessment & Plan Note (Signed)
Chronic.  Recently deteriorating cbg's, pt attributes to dietary indiscretions.   Recommended closer adherence to diet.  No changes to insulin. Filled strips and form for diabetic shoes. rtc 3 mo for f/u. Consider d/c glucotrol next visit as already on insulin.

## 2012-11-05 NOTE — Progress Notes (Addendum)
  Subjective:    Patient ID: Frank Simpson, male    DOB: 05-Oct-1931, 78 y.o.   MRN: [id number]  HPI CC: med refill  Recent complicated last few mnths.  Initial STEMI with DES to LAD 07/2012.  Had subsequent chest pain, but rpt catheterization stable.  Intermittent chest pain continued.  2nd hospitalization complicated by delirium attributed to benzo use.  rec medical management on aspirin and brilinta, most recently started in imdur.  Lab Results  Component Value Date   HGBA1C 8.2* 10/15/2012  DM - would like refills of strip as well as diabetic shoes.  This morning sugar 108.  Has had sugars running too high as well - up to 500s.  Recently trending down.  Last high sugar was 500s last week.  Reports compliance with meds, but has not been compliant with diabetic diet.  This am 108 fasting.  No hypoglycemic sxs.  Eye exam 12/2011.  Foot exam due today.  Checking sugars tid.  Requests diabetic shoe form filled out.  Past Medical History  Diagnosis Date  . Hypertension   . Diabetes mellitus 1994    dm edu 2012  . Hyperlipidemia   . Osteoarthritis   . CRI (chronic renal insufficiency)     baseline Cr seems to be 1.7-1.8  . Urinary incontinence     s/p PTNS didn't help  . Glaucoma(365)     and cataracts  . History of melanoma   . CAD (coronary artery disease)     07/2012 acute STEMI, mid LAD PCI - DES; cath 09/2012 patent LAD stent, non-hemodynamically significant Left Main/LAD disease, EF 55%  . Thrombocytopenia   . BCC (basal cell carcinoma of skin)     L neck (Dr. Sherrye Payor)  . Complication of anesthesia     confused after cath 10/15/2012     Review of Systems Per HPI    Objective:   Physical Exam  Nursing note and vitals reviewed. Constitutional: He appears well-developed and well-nourished. No distress.  HENT:  Head: Normocephalic and atraumatic.  Right Ear: External ear normal.  Left Ear: External ear normal.  Nose: Nose normal.  Mouth/Throat: Oropharynx is clear and  moist. No oropharyngeal exudate.  Eyes: Conjunctivae normal and EOM are normal. Pupils are equal, round, and reactive to light. No scleral icterus.  Neck: Normal range of motion. Neck supple.  Cardiovascular: Normal rate, regular rhythm, normal heart sounds and intact distal pulses.   No murmur heard. Pulmonary/Chest: Effort normal and breath sounds normal. No respiratory distress. He has no wheezes. He has no rales.  Musculoskeletal: He exhibits no edema.       Diabetic foot exam: Normal inspection No skin breakdown No calluses  Normal DP/PT pulses Normal sensation to light touch and decreased to monofilament throughout Big toenails thickened.  Lymphadenopathy:    He has no cervical adenopathy.  Skin: Skin is warm and dry. No rash noted.  Psychiatric: He has a normal mood and affect.       Assessment & Plan:

## 2012-11-05 NOTE — Assessment & Plan Note (Signed)
Reviewed recent hospitalizations. Chronic, followed closely by cards. Recently started on imdur.

## 2012-11-05 NOTE — Patient Instructions (Signed)
Strips sent to pharmacy. Good to see you today, call us with questions Keep a close eye on your sugars as they're too high!!  Return in 72month for diabetes follow up.

## 2012-11-07 ENCOUNTER — Encounter: Payer: Self-pay | Admitting: *Deleted

## 2012-11-07 ENCOUNTER — Encounter (HOSPITAL_COMMUNITY): Payer: Medicare Other

## 2012-11-09 ENCOUNTER — Other Ambulatory Visit: Payer: Self-pay

## 2012-11-09 ENCOUNTER — Encounter (HOSPITAL_COMMUNITY): Payer: Medicare Other

## 2012-11-09 NOTE — Telephone Encounter (Signed)
pts wife said [facility] needs signature from doctor to process glucose test strips for medicare guidelines. Melonie at [place] will fax form for signature. Mrs Valbuena said pt will be out of strips over weekend so request faxed back today. Fax received and in Dr Synthia Innocent in box. Dr Darnell Level signed form and I faxed to [place] [phone].Spoke with Melbeta at [employer] and completed form was received.Patient's wife notified as instructed by telephone that refills could be picked up this weekend for test strips.

## 2012-11-12 ENCOUNTER — Encounter (HOSPITAL_COMMUNITY): Payer: Medicare Other

## 2012-11-13 ENCOUNTER — Other Ambulatory Visit: Payer: Self-pay | Admitting: Cardiology

## 2012-11-13 MED ORDER — TAMSULOSIN HCL 0.4 MG PO CAPS: 0.4000 mg | ORAL_CAPSULE | Freq: Every day | ORAL | Status: DC

## 2012-11-14 ENCOUNTER — Encounter (HOSPITAL_COMMUNITY): Payer: Medicare Other

## 2012-11-14 ENCOUNTER — Ambulatory Visit: Payer: Medicare Other | Admitting: Nurse Practitioner

## 2012-11-16 ENCOUNTER — Encounter (HOSPITAL_COMMUNITY): Payer: Medicare Other

## 2012-11-19 ENCOUNTER — Encounter (HOSPITAL_COMMUNITY): Payer: Medicare Other

## 2012-11-23 ENCOUNTER — Encounter (HOSPITAL_COMMUNITY): Payer: Medicare Other

## 2012-11-26 ENCOUNTER — Encounter (HOSPITAL_COMMUNITY): Payer: Medicare Other

## 2012-11-29 DIAGNOSIS — R310 Gross hematuria: Secondary | ICD-10-CM | POA: Diagnosis not present

## 2012-11-29 DIAGNOSIS — R350 Frequency of micturition: Secondary | ICD-10-CM | POA: Diagnosis not present

## 2012-11-29 DIAGNOSIS — Z0000 Encounter for general adult medical examination without abnormal findings: Secondary | ICD-10-CM | POA: Diagnosis not present

## 2012-11-29 DIAGNOSIS — N400 Enlarged prostate without lower urinary tract symptoms: Secondary | ICD-10-CM | POA: Diagnosis not present

## 2012-11-29 DIAGNOSIS — R339 Retention of urine, unspecified: Secondary | ICD-10-CM | POA: Diagnosis not present

## 2012-11-30 ENCOUNTER — Encounter (HOSPITAL_COMMUNITY): Payer: Medicare Other

## 2012-12-03 ENCOUNTER — Encounter (HOSPITAL_COMMUNITY): Payer: Medicare Other

## 2012-12-05 ENCOUNTER — Encounter (HOSPITAL_COMMUNITY): Payer: Medicare Other

## 2012-12-06 DIAGNOSIS — R310 Gross hematuria: Secondary | ICD-10-CM | POA: Diagnosis not present

## 2012-12-06 DIAGNOSIS — N329 Bladder disorder, unspecified: Secondary | ICD-10-CM | POA: Diagnosis not present

## 2012-12-07 ENCOUNTER — Encounter (HOSPITAL_COMMUNITY): Payer: Medicare Other

## 2012-12-07 DIAGNOSIS — D485 Neoplasm of uncertain behavior of skin: Secondary | ICD-10-CM | POA: Diagnosis not present

## 2012-12-07 DIAGNOSIS — L821 Other seborrheic keratosis: Secondary | ICD-10-CM | POA: Diagnosis not present

## 2012-12-07 DIAGNOSIS — L570 Actinic keratosis: Secondary | ICD-10-CM | POA: Diagnosis not present

## 2012-12-07 DIAGNOSIS — L578 Other skin changes due to chronic exposure to nonionizing radiation: Secondary | ICD-10-CM | POA: Diagnosis not present

## 2012-12-07 DIAGNOSIS — D0439 Carcinoma in situ of skin of other parts of face: Secondary | ICD-10-CM | POA: Diagnosis not present

## 2012-12-07 DIAGNOSIS — C44320 Squamous cell carcinoma of skin of unspecified parts of face: Secondary | ICD-10-CM | POA: Diagnosis not present

## 2012-12-10 ENCOUNTER — Encounter (HOSPITAL_COMMUNITY): Payer: Medicare Other

## 2012-12-11 DIAGNOSIS — R351 Nocturia: Secondary | ICD-10-CM | POA: Diagnosis not present

## 2012-12-11 DIAGNOSIS — R350 Frequency of micturition: Secondary | ICD-10-CM | POA: Diagnosis not present

## 2012-12-11 DIAGNOSIS — N3941 Urge incontinence: Secondary | ICD-10-CM | POA: Diagnosis not present

## 2012-12-11 DIAGNOSIS — R310 Gross hematuria: Secondary | ICD-10-CM | POA: Diagnosis not present

## 2012-12-12 ENCOUNTER — Encounter (HOSPITAL_COMMUNITY): Payer: Medicare Other

## 2012-12-14 ENCOUNTER — Encounter (HOSPITAL_COMMUNITY): Payer: Medicare Other

## 2012-12-17 ENCOUNTER — Encounter (HOSPITAL_COMMUNITY): Payer: Medicare Other

## 2012-12-19 ENCOUNTER — Encounter (HOSPITAL_COMMUNITY): Payer: Medicare Other

## 2012-12-21 ENCOUNTER — Encounter (HOSPITAL_COMMUNITY): Payer: Medicare Other

## 2013-01-14 ENCOUNTER — Other Ambulatory Visit: Payer: Self-pay | Admitting: Family Medicine

## 2013-01-14 MED ORDER — "INSULIN SYRINGE-NEEDLE U-100 30G X 1/2"" 0.3 ML MISC": 1.0000 | Status: DC

## 2013-01-16 ENCOUNTER — Other Ambulatory Visit (INDEPENDENT_AMBULATORY_CARE_PROVIDER_SITE_OTHER): Payer: Medicare Other

## 2013-01-16 ENCOUNTER — Encounter: Payer: Self-pay | Admitting: Cardiology

## 2013-01-16 ENCOUNTER — Ambulatory Visit (INDEPENDENT_AMBULATORY_CARE_PROVIDER_SITE_OTHER): Payer: Medicare Other | Admitting: Cardiology

## 2013-01-16 VITALS — BP 130/72 | HR 70 | Ht 72.0 in | Wt 202.2 lb

## 2013-01-16 DIAGNOSIS — I5023 Acute on chronic systolic (congestive) heart failure: Secondary | ICD-10-CM | POA: Diagnosis not present

## 2013-01-16 DIAGNOSIS — E1149 Type 2 diabetes mellitus with other diabetic neurological complication: Secondary | ICD-10-CM

## 2013-01-16 DIAGNOSIS — I509 Heart failure, unspecified: Secondary | ICD-10-CM

## 2013-01-16 DIAGNOSIS — E7800 Pure hypercholesterolemia, unspecified: Secondary | ICD-10-CM | POA: Diagnosis not present

## 2013-01-16 DIAGNOSIS — I119 Hypertensive heart disease without heart failure: Secondary | ICD-10-CM | POA: Diagnosis not present

## 2013-01-16 DIAGNOSIS — I259 Chronic ischemic heart disease, unspecified: Secondary | ICD-10-CM | POA: Diagnosis not present

## 2013-01-16 DIAGNOSIS — IMO0001 Reserved for inherently not codable concepts without codable children: Secondary | ICD-10-CM

## 2013-01-16 DIAGNOSIS — E1142 Type 2 diabetes mellitus with diabetic polyneuropathy: Secondary | ICD-10-CM

## 2013-01-16 DIAGNOSIS — E1165 Type 2 diabetes mellitus with hyperglycemia: Secondary | ICD-10-CM

## 2013-01-16 DIAGNOSIS — I129 Hypertensive chronic kidney disease with stage 1 through stage 4 chronic kidney disease, or unspecified chronic kidney disease: Secondary | ICD-10-CM

## 2013-01-16 DIAGNOSIS — I2510 Atherosclerotic heart disease of native coronary artery without angina pectoris: Secondary | ICD-10-CM

## 2013-01-16 LAB — HEPATIC FUNCTION PANEL
Albumin: 3.4 g/dL — ABNORMAL LOW (ref 3.5–5.2)
Alkaline Phosphatase: 39 U/L (ref 39–117)

## 2013-01-16 LAB — BASIC METABOLIC PANEL
BUN: 17 mg/dL (ref 6–23)
CO2: 27 mEq/L (ref 19–32)
Chloride: 106 mEq/L (ref 96–112)
Creatinine, Ser: 1.5 mg/dL (ref 0.4–1.5)

## 2013-01-16 LAB — LIPID PANEL
LDL Cholesterol: 38 mg/dL (ref 0–99)
Total CHOL/HDL Ratio: 3
Triglycerides: 142 mg/dL (ref 0.0–149.0)

## 2013-01-16 NOTE — Assessment & Plan Note (Signed)
The patient has not been having any severe hypoglycemic episodes.  He had a mild episode of hypoglycemia last week and checked his blood sugar and it was 60.

## 2013-01-16 NOTE — Assessment & Plan Note (Signed)
The patient has not been having any symptoms of congestive heart failure.  No orthopnea or paroxysmal nocturnal dyspnea or edema.

## 2013-01-16 NOTE — Assessment & Plan Note (Signed)
The patient is on dual antiplatelet therapy.  He is not having a bleeding problems

## 2013-01-16 NOTE — Assessment & Plan Note (Signed)
The patient checks his blood pressure at home and states it has been running normal.  The patient does have occasional dizzy spells when he gets up at night to go to the bathroom.  He is dizzy when he first stands up but after a few seconds the dizziness clears and he is able to walk to the bathroom without difficulty.  He has nocturia x3 or 4.

## 2013-01-16 NOTE — Progress Notes (Signed)
Quick Note:  Please report to patient. The recent labs are stable. Continue same medication and careful diet. ______

## 2013-01-16 NOTE — Progress Notes (Signed)
Frank Simpson Date of Birth:  11-21-1931 [facility] [address] [place], [place]  [address] [phone]         Fax   [phone]  History of Present Illness: Frank Simpson is seen back today for a scheduled followup office visit. On 08/06/2012 the patient had a STEMI with DES to the mid LAD. He is on Brilinta and a baby aspirin daily. His other issues include CKD, HTN, DM, HLD, OA, urinary incontinence, glaucoma, melanoma and thrombocytopenia.  Since last visit he has been doing well.  No recurrent angina.   Current Outpatient Prescriptions  Medication Sig Dispense Refill  . acetaminophen (TYLENOL) 500 MG tablet Take 1 tablet (500 mg total) by mouth 2 (two) times daily.  30 tablet  0  . amLODipine (NORVASC) 5 MG tablet TAKE 1 TABLET BY MOUTH EVERY DAY  90 tablet  3  . aspirin 81 MG tablet Take 1 tablet (81 mg total) by mouth daily.  30 tablet    . glipiZIDE (GLUCOTROL) 10 MG tablet Take 1 tablet (10 mg total) by mouth daily.  90 tablet  3  . glucose blood test strip One touch.  Check tid 250.02  100 each  11  . insulin glargine (LANTUS) 100 UNIT/ML injection Inject 70 Units into the skin every morning. Qs 1 month      . insulin glargine (LANTUS) 100 UNIT/ML injection Inject 70 Units into the skin every morning as directed.  30 mL  4  . insulin lispro (HUMALOG) 100 UNIT/ML injection Inject 5-15 Units into the skin 3 (three) times daily before meals. Sliding scale:  150-200 2 units, 201-250 4 units, 251-300 6 units, 301-350 8 units, >350 10 units, >450 15 units      . Insulin Syringe-Needle U-100 30G X 1/2" 0.3 ML MISC 1 Syringe by Does not apply route as directed.  100 each  3  . isosorbide mononitrate (IMDUR) 30 MG 24 hr tablet Take 1 tablet (30 mg total) by mouth daily.  60 tablet  6  . nitroGLYCERIN (NITROSTAT) 0.4 MG SL tablet Place 1 tablet (0.4 mg total) under the tongue every 5 (five) minutes as needed for chest pain. Up to three doses,if pain continues call  [phone]  25 tablet  3  . rosuvastatin (CRESTOR) 40 MG tablet Take 1 tablet (40 mg total) by mouth daily.  30 tablet  11  . Tamsulosin HCl (FLOMAX) 0.4 MG CAPS Take 1 capsule (0.4 mg total) by mouth daily.  90 capsule  3  . Ticagrelor (BRILINTA) 90 MG TABS tablet Take 1 tablet (90 mg total) by mouth 2 (two) times daily.  60 tablet  6   No current facility-administered medications for this visit.    Allergies  Allergen Reactions  . Januvia (Sitagliptin Phosphate) Other (See Comments)    Possibly affected kidneys?  . Metformin And Related Other (See Comments)    Affected kidneys  . Penicillins Other (See Comments)    Reaction long time ago - doesn't remember    Patient Active Problem List  Diagnosis  . Type 2 diabetes, uncontrolled, with neuropathy  . Hypercholesterolemia  . HTN (hypertension)  . Osteoarthritis  . CRI (chronic renal insufficiency)  . Urinary incontinence  . Chest pain  . Shoulder pain  . STEMI (ST elevation myocardial infarction)  . Thrombocytopenia  . Unstable angina  . CAD (coronary artery disease)  . Acute on chronic systolic CHF (congestive heart failure)  . DM type 2 causing CKD  stage 3  . Benign hypertension with CKD (chronic kidney disease) stage III    History  Smoking status  . Never Smoker   Smokeless tobacco  . Never Used    History  Alcohol Use No    Family History  Problem Relation Age of Onset  . Stroke Mother     hemorrhage  . Diabetes Mother   . Cancer Father     lung  . Diabetes Brother     Review of Systems: Constitutional: no fever chills diaphoresis or fatigue or change in weight.  Head and neck: no hearing loss, no epistaxis, no photophobia or visual disturbance. Respiratory: No cough, shortness of breath or wheezing. Cardiovascular: No chest pain peripheral edema, palpitations. Gastrointestinal: No abdominal distention, no abdominal pain, no change in bowel habits hematochezia or melena. Genitourinary: No dysuria, no  frequency, no urgency, no nocturia. Musculoskeletal:No arthralgias, no back pain, no gait disturbance or myalgias. Neurological: No dizziness, no headaches, no numbness, no seizures, no syncope, no weakness, no tremors. Hematologic: No lymphadenopathy, no easy bruising. Psychiatric: No confusion, no hallucinations, no sleep disturbance.    Physical Exam: Filed Vitals:   01/16/13 0853  BP: 130/72  Pulse: 70   the general appearance reveals a well-developed well-nourished gentleman in no distress.The head and neck exam reveals pupils equal and reactive.  Extraocular movements are full.  There is no scleral icterus.  The mouth and pharynx are normal.  The neck is supple.  The carotids reveal no bruits.  The jugular venous pressure is normal.  The  thyroid is not enlarged.  There is no lymphadenopathy.  The chest is clear to percussion and auscultation.  There are no rales or rhonchi.  Expansion of the chest is symmetrical.  The precordium is quiet.  The first heart sound is normal.  The second heart sound is physiologically split.  There is no murmur gallop rub or click.  There is no abnormal lift or heave.  The abdomen is soft and nontender.  The bowel sounds are normal.  The liver and spleen are not enlarged.  There are no abdominal masses.  There are no abdominal bruits.  Extremities reveal good pedal pulses.  There is no phlebitis or edema.  There is no cyanosis or clubbing.  Strength is normal and symmetrical in all extremities.  There is no lateralizing weakness.  There are no sensory deficits.  The skin is warm and dry.  There is no rash.     Assessment / Plan: Continue same medication.  Continue walking at home for exercise.  Recheck in 3 months for office visit EKG lipid panel hepatic function panel basal metabolic panel and CBC.

## 2013-01-16 NOTE — Patient Instructions (Signed)
Will obtain labs today and call you with the results (lp/met/hfp)  Your physician recommends that you continue on your current medications as directed. Please refer to the Current Medication list given to you today.  Your physician recommends that you schedule a follow-up appointment in: 3 months with fasting labs (LP/BMET/HFP/CBC) and ekg

## 2013-01-17 ENCOUNTER — Ambulatory Visit: Payer: Medicare Other | Admitting: Family Medicine

## 2013-01-17 ENCOUNTER — Telehealth: Payer: Self-pay | Admitting: *Deleted

## 2013-01-17 NOTE — Telephone Encounter (Signed)
Message copied by Earvin Hansen on Thu Jan 17, 2013  9:06 AM ------      Message from: Darlin Coco      Created: Wed Jan 16, 2013  9:51 PM       Please report to patient.  The recent labs are stable. Continue same medication and careful diet. ------

## 2013-01-17 NOTE — Telephone Encounter (Signed)
Advised patient of lab results

## 2013-02-03 DIAGNOSIS — S1093XA Contusion of unspecified part of neck, initial encounter: Secondary | ICD-10-CM | POA: Diagnosis not present

## 2013-02-03 DIAGNOSIS — B029 Zoster without complications: Secondary | ICD-10-CM | POA: Diagnosis not present

## 2013-02-03 DIAGNOSIS — R51 Headache: Secondary | ICD-10-CM | POA: Diagnosis not present

## 2013-02-04 DIAGNOSIS — S0990XA Unspecified injury of head, initial encounter: Secondary | ICD-10-CM | POA: Diagnosis not present

## 2013-02-26 ENCOUNTER — Ambulatory Visit (INDEPENDENT_AMBULATORY_CARE_PROVIDER_SITE_OTHER): Payer: Medicare Other | Admitting: Family Medicine

## 2013-02-26 ENCOUNTER — Encounter: Payer: Self-pay | Admitting: Family Medicine

## 2013-02-26 VITALS — BP 152/78 | HR 73 | Temp 98.3°F | Wt 193.8 lb

## 2013-02-26 DIAGNOSIS — B0229 Other postherpetic nervous system involvement: Secondary | ICD-10-CM | POA: Diagnosis not present

## 2013-02-26 MED ORDER — HYDROCODONE-ACETAMINOPHEN 5-325 MG PO TABS: 1.0000 | ORAL_TABLET | Freq: Four times a day (QID) | ORAL | Status: DC | PRN

## 2013-02-26 MED ORDER — GABAPENTIN 100 MG PO CAPS: 100.0000 mg | ORAL_CAPSULE | Freq: Two times a day (BID) | ORAL | Status: DC

## 2013-02-26 NOTE — Assessment & Plan Note (Signed)
Of R axilla. Discussed pathophysiology. Stop tramadol as not helping. Start vicodin prn pain and gabapentin - start low and titrate slowly - discussed common side effects to monitor. If not better, consider capsacin. Will need zostavax in future.

## 2013-02-26 NOTE — Patient Instructions (Addendum)
You have post herpetic neuralgia. Stop tramadol. May use hydrocodone as needed for pain. I'd like [place] to start medicine called gabapentin 167m (1 pill at night time for 3-4 days then may increase to 1 pill twice daily.  May go up to 3 times daily for nerve pain).  Postherpetic Neuralgia Shingles is a painful disease. It is caused by the herpes zoster virus. This is the same virus which also causes chickenpox. It can affect the torso, limbs, or the face. For most people, shingles is a condition of rather sudden onset. Pain usually lasts about 1 month. In older patients, or patients with poor immune systems, a painful, long-standing (chronic) condition called postherpetic neuralgia can develop. This condition rarely happens before age 77 But at least 50% of people over 50 become affected following an attack of shingles. There is a natural tendency for this condition to improve over time with no treatment. Less than 5% of patients have pain that lasts for more than 1 year. DIAGNOSIS  Herpes is usually easily diagnosed on physical exam. Pain sometimes follows when the skin sores (lesions) have disappeared. It is called postherpetic neuralgia. That name simply means the pain that follows herpes. TREATMENT   Treating this condition may be difficult. Usually one of the tricyclic antidepressants, often amitriptyline, is the first line of treatment. There is evidence that the sooner these medications are given, the more likely they are to reduce pain.  Conventional analgesics, regional nerve blocks, and anticonvulsants have little benefit in most cases when used alone. Other tricyclic anti-depressants are used as a second option if the first antidepressant is unsuccessful.  Anticonvulsants, including carbamazepine, have been found to provide some added benefit when used with a tricyclic anti-depressant. This is especially for the stabbing type of pain similar to that of trigeminal neuralgia.  Chronic  opioid therapy. This is a strong narcotic pain medication. It is used to treat pain that is resistant to other measures. The issues of dependency and tolerance can be reduced with closely managed care.  Some cream treatments are applied locally to the affected area. They can help when used with other treatments. Their use may be difficult in the case of postherpetic trigeminal neuralgia. This is involved with the face. So the substances can irritate the eye and the skin around the eye. Examples of creams used include Capsaicin and lidocaine creams.  For shingles, antiviral therapies along with analgesics are recommended. Studies of the effect of anti-viral agents such as acyclovir on shingles have been done. They show improved rates of healing and decreased severity of sudden (acute) pain. Some observations suggest that nerve blocks during shingles infection will:  Reduce pain.  Shorten the acute episode.  Prevent the emergence of postherpetic neuralgia. Viral medications used include Acyclovir (Zovirax), Valacyclovir, Famciclovir and a lysine diet. Document Released: 02/04/2003 Document Revised: 02/06/2012 Document Reviewed: 11/14/2005 [facility] HospitalPatient Information 2013 [place]

## 2013-02-26 NOTE — Progress Notes (Signed)
  Subjective:    Patient ID: Frank Simpson, male    DOB: 1931-10-08, 79 y.o.   MRN: [id number]  HPI CC: shingles f/u  Seen at [facility] 02/03/2013 with dx shingles - R axilla and down posterior arm Treated with valtrex x 7d course as well as tramadol for pain which isn't helping. Prior was taking darvocets. Decreased appetite 2/2 pain. No longer spreading.  Dried up.  Still red and very tender.  Burning and sharp stabbing pain.  No fevers, abd pain, nausea,   On aspirin and brilinta Lab Results  Component Value Date   HGBA1C 8.2* 10/15/2012   Past Medical History  Diagnosis Date  . Hypertension   . Diabetes mellitus 1994    dm edu 2012  . Hyperlipidemia   . Osteoarthritis   . CRI (chronic renal insufficiency)     baseline Cr seems to be 1.7-1.8  . Urinary incontinence     s/p PTNS didn't help  . Glaucoma(365)     and cataracts  . History of melanoma   . CAD (coronary artery disease)     07/2012 acute STEMI, mid LAD PCI - DES; cath 09/2012 patent LAD stent, non-hemodynamically significant Left Main/LAD disease, EF 55%  . Thrombocytopenia   . BCC (basal cell carcinoma of skin)     L neck (Dr. Sherrye Payor)  . Complication of anesthesia     confused after cath 10/15/2012    Review of Systems Per HPI    Objective:   Physical Exam  Skin:      Pleasant elderly with erythematous dried vesicles at axilla and along T2-3 distribution and some down posterior upper arm.    Assessment & Plan:

## 2013-03-04 ENCOUNTER — Ambulatory Visit (INDEPENDENT_AMBULATORY_CARE_PROVIDER_SITE_OTHER): Payer: Medicare Other | Admitting: Family Medicine

## 2013-03-04 ENCOUNTER — Encounter: Payer: Self-pay | Admitting: Family Medicine

## 2013-03-04 VITALS — BP 128/76 | HR 82 | Temp 97.7°F | Wt 198.5 lb

## 2013-03-04 DIAGNOSIS — B0229 Other postherpetic nervous system involvement: Secondary | ICD-10-CM | POA: Diagnosis not present

## 2013-03-04 MED ORDER — GABAPENTIN 300 MG PO CAPS: 300.0000 mg | ORAL_CAPSULE | Freq: Two times a day (BID) | ORAL | Status: DC

## 2013-03-04 MED ORDER — HYDROCODONE-ACETAMINOPHEN 5-325 MG PO TABS: 1.0000 | ORAL_TABLET | Freq: Four times a day (QID) | ORAL | Status: DC | PRN

## 2013-03-04 NOTE — Patient Instructions (Addendum)
You have persistent post herpetic neuralgia. vicodin refilled. Increase gabapentin to 2 pills twice daily (211m) for 5 days then increase to 3 pills (3051m twice daily.  I'll send in higher dose to pharmacy.  If any sedation, don't increase so quickly. Try capsacin cream on right underarm (over the counter, check with pharamcist). Let me know how this works - if not improving, call me.  We have room to go up on gabapentin.

## 2013-03-04 NOTE — Progress Notes (Signed)
  Subjective:    Patient ID: Frank Simpson, male    DOB: 11-17-1931, 79 y.o.   MRN: [id number]  HPI CC: f/u shingles  Presents with wife.  See prior note for details. PHN - treated with vicodin as well as gabapentin - not too sedating.  Continued pain.  Meds minimally helped. Taking vicodin 3-4 times a day.  Denies constipation. Compliant with gabapentin 137m twice a day.  Rash staying red but no longer peeled.  Review of Systems Per HPI    Objective:   Physical Exam deferred    Assessment & Plan:

## 2013-03-04 NOTE — Assessment & Plan Note (Signed)
Persistent pain.   Will continue vicodin, decrease to tid dosing.  Monitor for constipation. Slowly increase gabapentin to goal of 373m bid. Start capsaicin cream prn. Will need zostavax in future. Update me if sxs persist.

## 2013-03-12 ENCOUNTER — Telehealth: Payer: Self-pay

## 2013-03-12 MED ORDER — PREGABALIN 75 MG PO CAPS: 75.0000 mg | ORAL_CAPSULE | Freq: Two times a day (BID) | ORAL | Status: DC

## 2013-03-12 NOTE — Telephone Encounter (Signed)
No improvement on gabapentin 339m bid Stop gabapentin. May do trial of lyrica 750mone pill twice daily - plz phone into pharmacy and notify pt/wife. If not improved after 1 week, may go up to 2 pills (15072mtwice daily.

## 2013-03-12 NOTE — Telephone Encounter (Signed)
Mrs Maniscalco said pt seen 04/07 and taking vicodin 5-325 mg one tab q 6 hr and gabapentin 300 mg twice a day with no relief of pain. Now pt pain level is 8-9. The red rash is almost gone. Friend of pt tried Lyrica for shingles pain and it helped; wanted to know if Dr Darnell Level would try Lyrica for pain. [facility]. Mrs Aguas request call back.

## 2013-03-13 NOTE — Telephone Encounter (Signed)
Patient's wife notified and Rx called in as directed.

## 2013-03-18 ENCOUNTER — Encounter: Payer: Self-pay | Admitting: Family Medicine

## 2013-03-18 ENCOUNTER — Ambulatory Visit (INDEPENDENT_AMBULATORY_CARE_PROVIDER_SITE_OTHER): Payer: Medicare Other | Admitting: Family Medicine

## 2013-03-18 VITALS — BP 142/84 | HR 74 | Temp 98.3°F | Wt 201.5 lb

## 2013-03-18 DIAGNOSIS — B0229 Other postherpetic nervous system involvement: Secondary | ICD-10-CM

## 2013-03-18 DIAGNOSIS — R319 Hematuria, unspecified: Secondary | ICD-10-CM | POA: Diagnosis not present

## 2013-03-18 LAB — POCT URINALYSIS DIPSTICK

## 2013-03-18 MED ORDER — CIPROFLOXACIN HCL 250 MG PO TABS: 250.0000 mg | ORAL_TABLET | Freq: Two times a day (BID) | ORAL | Status: DC

## 2013-03-18 NOTE — Patient Instructions (Signed)
For post-shingles pain - increase lyrica to 2 pills twice daily until you run out - then restart gabapentin 36m twice daily.  May slowly increase by 1039mevery few days to goal of 60035mwice daily. For blood in urine - I think this is due to infection.  Treat with cipro twice daily for 7 days, update us [place] not improving with this treatment.  I have sent off urine culture. If not improving or negative culture, we will set you up with urologist.

## 2013-03-18 NOTE — Progress Notes (Signed)
Subjective:    Patient ID: Frank Simpson, male    DOB: 1931-10-09, 79 y.o.   MRN: [id number]  HPI CC: f/u shingles, blood in urine  Presents with wife.  See prior note for details.  PHN - treated with vicodin as well as gabapentin - not too sedating. Continued pain. Meds minimally helped.  Has tried tylenol, NSAIDs.  States capsaicin cream hasn't helped much. Taking vicodin twice daily.  Denies constipation.  Increased gabapentin to 356m bid, with no improvement.  Trial of lyrica sent in.  Tolerating well but very expensive and still no effect.  Also noticed blood in urine noted 2 days ago.  Passing blood clots through urethra.  + dysuria, frequency, urgency.  Yesterday used phenazopyridine for burning.   No abd pain, fevers/chills, nausea/vomiting, flank pain. No increased exertion.  No falls onto flank. Had blood in urine 11/2012. Only on aspirin as blood thinner. Nonsmoker.  No recent chemical vapor exposure.  Used to pain cars and work on motors years ago.  Lab Results  Component Value Date   HGBA1C 8.2* 10/15/2012  fasting sugar a few days ago 128.  night time as high as 280  Past Medical History  Diagnosis Date  . Hypertension   . Diabetes mellitus 1994    dm edu 2012  . Hyperlipidemia   . Osteoarthritis   . CRI (chronic renal insufficiency)     baseline Cr seems to be 1.7-1.8  . Urinary incontinence     s/p PTNS didn't help  . Glaucoma(365)     and cataracts  . History of melanoma   . CAD (coronary artery disease)     07/2012 acute STEMI, mid LAD PCI - DES; cath 09/2012 patent LAD stent, non-hemodynamically significant Left Main/LAD disease, EF 55%  . Thrombocytopenia   . BCC (basal cell carcinoma of skin)     L neck (Dr. HSherrye Payor  . Complication of anesthesia     confused after cath 10/15/2012  . CVA (cerebral infarction) 09/2012    remote anterior limb of left internal capsule  . Post herpetic neuralgia     Current Outpatient Prescriptions on File Prior to  Visit  Medication Sig Dispense Refill  . acetaminophen (TYLENOL) 500 MG tablet Take 1 tablet (500 mg total) by mouth 2 (two) times daily.  30 tablet  0  . amLODipine (NORVASC) 5 MG tablet TAKE 1 TABLET BY MOUTH EVERY DAY  90 tablet  3  . aspirin 81 MG tablet Take 1 tablet (81 mg total) by mouth daily.  30 tablet    . glipiZIDE (GLUCOTROL) 10 MG tablet Take 1 tablet (10 mg total) by mouth daily.  90 tablet  3  . glucose blood test strip One touch.  Check tid 250.02  100 each  11  . HYDROcodone-acetaminophen (NORCO/VICODIN) 5-325 MG per tablet Take 1 tablet by mouth every 6 (six) hours as needed for pain.  60 tablet  0  . insulin glargine (LANTUS) 100 UNIT/ML injection Inject 70 Units into the skin every morning as directed.  30 mL  4  . insulin lispro (HUMALOG) 100 UNIT/ML injection Inject 5-15 Units into the skin 3 (three) times daily before meals. Sliding scale:  150-200 2 units, 201-250 4 units, 251-300 6 units, 301-350 8 units, >350 10 units, >450 15 units      . Insulin Syringe-Needle U-100 30G X 1/2" 0.3 ML MISC 1 Syringe by Does not apply route as directed.  100 each  3  . isosorbide  mononitrate (IMDUR) 30 MG 24 hr tablet Take 1 tablet (30 mg total) by mouth daily.  60 tablet  6  . nitroGLYCERIN (NITROSTAT) 0.4 MG SL tablet Place 1 tablet (0.4 mg total) under the tongue every 5 (five) minutes as needed for chest pain. Up to three doses,if pain continues call 911  25 tablet  3  . pregabalin (LYRICA) 75 MG capsule Take 1 capsule (75 mg total) by mouth 2 (two) times daily.  60 capsule  3  . rosuvastatin (CRESTOR) 40 MG tablet Take 1 tablet (40 mg total) by mouth daily.  30 tablet  11  . Tamsulosin HCl (FLOMAX) 0.4 MG CAPS Take 1 capsule (0.4 mg total) by mouth daily.  90 capsule  3  . Ticagrelor (BRILINTA) 90 MG TABS tablet Take 1 tablet (90 mg total) by mouth 2 (two) times daily.  60 tablet  6   No current facility-administered medications on file prior to visit.   Review of Systems Per  HPI    Objective:   Physical Exam  Nursing note and vitals reviewed. Constitutional: He appears well-developed and well-nourished. No distress.  Abdominal: Soft. Normal appearance and bowel sounds are normal. He exhibits no distension and no mass. There is no hepatosplenomegaly. There is no tenderness. There is no rebound, no guarding and no CVA tenderness.  Musculoskeletal: He exhibits edema (tr pedal edema).  Skin: Skin is warm and dry. No rash noted.  Psychiatric: He has a normal mood and affect.       Assessment & Plan:

## 2013-03-19 DIAGNOSIS — R319 Hematuria, unspecified: Secondary | ICD-10-CM | POA: Insufficient documentation

## 2013-03-19 NOTE — Assessment & Plan Note (Signed)
Still poorly controlled pain despite multiple interventions including vicodin, gabapentin, lyrica. Discussed doubling up to 152m bid for next several weeks until he runs out, then transition back to gabapentin and slowly titrate up to goal 6068mbid. Recommended continue capsaicin and vicodin prn.

## 2013-03-19 NOTE — Assessment & Plan Note (Signed)
I expect this to be due to UTI - treat as such with 7 d course of cipro 259m bid (renally dosed) UCx sent. If unrevealing UCx, or if persistent blood in urine, discussed referral to urologist.

## 2013-03-22 ENCOUNTER — Telehealth: Payer: Self-pay

## 2013-03-22 LAB — URINE CULTURE: Colony Count: >=100000

## 2013-03-22 NOTE — Telephone Encounter (Signed)
Mrs Crocket said she checked and pt only got # 10 of Cipro 250 mg; I spoke with Amber at [facility] and she said 2 different prescriptions for Cipro 250 mg were sent over on 03/18/13. Amber will fill # 4 additional pills for pt. Mrs. Scheurich will get med from [facility].

## 2013-03-26 ENCOUNTER — Other Ambulatory Visit: Payer: Self-pay | Admitting: Family Medicine

## 2013-03-28 HISTORY — PX: OTHER SURGICAL HISTORY: SHX169

## 2013-03-28 HISTORY — PX: US ECHOCARDIOGRAPHY: HXRAD669

## 2013-04-02 ENCOUNTER — Emergency Department (HOSPITAL_COMMUNITY): Payer: Medicare Other

## 2013-04-02 ENCOUNTER — Encounter (HOSPITAL_COMMUNITY): Payer: Self-pay | Admitting: General Practice

## 2013-04-02 ENCOUNTER — Observation Stay (HOSPITAL_COMMUNITY)
Admission: EM | Admit: 2013-04-02 | Discharge: 2013-04-04 | Disposition: A | Discharge status: Home / Self Care | Payer: Medicare Other | Attending: Internal Medicine | Admitting: Internal Medicine

## 2013-04-02 DIAGNOSIS — R29898 Other symptoms and signs involving the musculoskeletal system: Secondary | ICD-10-CM | POA: Insufficient documentation

## 2013-04-02 DIAGNOSIS — E1142 Type 2 diabetes mellitus with diabetic polyneuropathy: Secondary | ICD-10-CM | POA: Insufficient documentation

## 2013-04-02 DIAGNOSIS — I440 Atrioventricular block, first degree: Secondary | ICD-10-CM | POA: Diagnosis not present

## 2013-04-02 DIAGNOSIS — D696 Thrombocytopenia, unspecified: Secondary | ICD-10-CM

## 2013-04-02 DIAGNOSIS — R5381 Other malaise: Secondary | ICD-10-CM | POA: Diagnosis not present

## 2013-04-02 DIAGNOSIS — I5023 Acute on chronic systolic (congestive) heart failure: Secondary | ICD-10-CM

## 2013-04-02 DIAGNOSIS — R079 Chest pain, unspecified: Secondary | ICD-10-CM | POA: Diagnosis not present

## 2013-04-02 DIAGNOSIS — R55 Syncope and collapse: Principal | ICD-10-CM

## 2013-04-02 DIAGNOSIS — Z794 Long term (current) use of insulin: Secondary | ICD-10-CM | POA: Diagnosis present

## 2013-04-02 DIAGNOSIS — E1129 Type 2 diabetes mellitus with other diabetic kidney complication: Secondary | ICD-10-CM | POA: Insufficient documentation

## 2013-04-02 DIAGNOSIS — E1165 Type 2 diabetes mellitus with hyperglycemia: Secondary | ICD-10-CM | POA: Diagnosis not present

## 2013-04-02 DIAGNOSIS — N189 Chronic kidney disease, unspecified: Secondary | ICD-10-CM

## 2013-04-02 DIAGNOSIS — M1990 Unspecified osteoarthritis, unspecified site: Secondary | ICD-10-CM

## 2013-04-02 DIAGNOSIS — Z9861 Coronary angioplasty status: Secondary | ICD-10-CM | POA: Insufficient documentation

## 2013-04-02 DIAGNOSIS — I959 Hypotension, unspecified: Secondary | ICD-10-CM | POA: Diagnosis not present

## 2013-04-02 DIAGNOSIS — R404 Transient alteration of awareness: Secondary | ICD-10-CM | POA: Diagnosis not present

## 2013-04-02 DIAGNOSIS — IMO0002 Reserved for concepts with insufficient information to code with codable children: Secondary | ICD-10-CM

## 2013-04-02 DIAGNOSIS — Z7982 Long term (current) use of aspirin: Secondary | ICD-10-CM | POA: Insufficient documentation

## 2013-04-02 DIAGNOSIS — I2510 Atherosclerotic heart disease of native coronary artery without angina pectoris: Secondary | ICD-10-CM

## 2013-04-02 DIAGNOSIS — E7800 Pure hypercholesterolemia, unspecified: Secondary | ICD-10-CM

## 2013-04-02 DIAGNOSIS — R0602 Shortness of breath: Secondary | ICD-10-CM | POA: Diagnosis not present

## 2013-04-02 DIAGNOSIS — I129 Hypertensive chronic kidney disease with stage 1 through stage 4 chronic kidney disease, or unspecified chronic kidney disease: Secondary | ICD-10-CM | POA: Insufficient documentation

## 2013-04-02 DIAGNOSIS — M25519 Pain in unspecified shoulder: Secondary | ICD-10-CM

## 2013-04-02 DIAGNOSIS — I10 Essential (primary) hypertension: Secondary | ICD-10-CM

## 2013-04-02 DIAGNOSIS — N183 Chronic kidney disease, stage 3 unspecified: Secondary | ICD-10-CM

## 2013-04-02 DIAGNOSIS — I200 Unstable angina: Secondary | ICD-10-CM

## 2013-04-02 DIAGNOSIS — I213 ST elevation (STEMI) myocardial infarction of unspecified site: Secondary | ICD-10-CM

## 2013-04-02 DIAGNOSIS — E1122 Type 2 diabetes mellitus with diabetic chronic kidney disease: Secondary | ICD-10-CM

## 2013-04-02 DIAGNOSIS — R0989 Other specified symptoms and signs involving the circulatory and respiratory systems: Secondary | ICD-10-CM | POA: Diagnosis not present

## 2013-04-02 DIAGNOSIS — R51 Headache: Secondary | ICD-10-CM | POA: Diagnosis not present

## 2013-04-02 DIAGNOSIS — E1149 Type 2 diabetes mellitus with other diabetic neurological complication: Secondary | ICD-10-CM

## 2013-04-02 DIAGNOSIS — B0229 Other postherpetic nervous system involvement: Secondary | ICD-10-CM

## 2013-04-02 DIAGNOSIS — N289 Disorder of kidney and ureter, unspecified: Secondary | ICD-10-CM

## 2013-04-02 DIAGNOSIS — R319 Hematuria, unspecified: Secondary | ICD-10-CM

## 2013-04-02 DIAGNOSIS — I509 Heart failure, unspecified: Secondary | ICD-10-CM | POA: Insufficient documentation

## 2013-04-02 DIAGNOSIS — I252 Old myocardial infarction: Secondary | ICD-10-CM | POA: Insufficient documentation

## 2013-04-02 DIAGNOSIS — E1140 Type 2 diabetes mellitus with diabetic neuropathy, unspecified: Secondary | ICD-10-CM

## 2013-04-02 HISTORY — DX: Zoster without complications: B02.9

## 2013-04-02 HISTORY — DX: Syncope and collapse: R55

## 2013-04-02 LAB — CBC WITH DIFFERENTIAL/PLATELET
Basophils Absolute: 0 10*3/uL (ref 0.0–0.1)
Basophils Relative: 0 % (ref 0–1)
Eosinophils Absolute: 0 10*3/uL (ref 0.0–0.7)
MCH: 30.8 pg (ref 26.0–34.0)
MCHC: 36.4 g/dL — ABNORMAL HIGH (ref 30.0–36.0)
Monocytes Relative: 7 % (ref 3–12)
Neutrophils Relative %: 75 % (ref 43–77)
Platelets: 143 10*3/uL — ABNORMAL LOW (ref 150–400)
RDW: 14.8 % (ref 11.5–15.5)

## 2013-04-02 LAB — PROTIME-INR: Prothrombin Time: 13.7 seconds (ref 11.6–15.2)

## 2013-04-02 LAB — BASIC METABOLIC PANEL
BUN: 16 mg/dL (ref 6–23)
GFR calc Af Amer: 44 mL/min — ABNORMAL LOW (ref >90)
GFR calc non Af Amer: 38 mL/min — ABNORMAL LOW (ref >90)
Potassium: 3.8 mEq/L (ref 3.5–5.1)
Sodium: 141 mEq/L (ref 135–145)

## 2013-04-02 LAB — GLUCOSE, CAPILLARY
Glucose-Capillary: 91 mg/dL (ref 70–99)
Glucose-Capillary: 181 mg/dL — ABNORMAL HIGH (ref 70–99)

## 2013-04-02 LAB — CBC
MCH: 31.2 pg (ref 26.0–34.0)
MCHC: 36.7 g/dL — ABNORMAL HIGH (ref 30.0–36.0)
Platelets: 146 10*3/uL — ABNORMAL LOW (ref 150–400)
RBC: 4.45 MIL/uL (ref 4.22–5.81)
RDW: 14.7 % (ref 11.5–15.5)

## 2013-04-02 LAB — TROPONIN I: Troponin I: <0.3 ng/mL (ref <0.30)

## 2013-04-02 LAB — CREATININE, SERUM: Creatinine, Ser: 1.48 mg/dL — ABNORMAL HIGH (ref 0.50–1.35)

## 2013-04-02 MED ORDER — ONDANSETRON HCL 4 MG PO TABS: 4.0000 mg | ORAL_TABLET | Freq: Four times a day (QID) | ORAL | Status: DC | PRN

## 2013-04-02 MED ORDER — ASPIRIN 81 MG PO TABS: 81.0000 mg | ORAL_TABLET | Freq: Every day | ORAL | Status: DC

## 2013-04-02 MED ORDER — SODIUM CHLORIDE 0.9 % IJ SOLN
3.0000 mL | Freq: Two times a day (BID) | INTRAMUSCULAR | Status: DC
  Administered 2013-04-02: 3 mL via INTRAVENOUS

## 2013-04-02 MED ORDER — HEPARIN SODIUM (PORCINE) 5000 UNIT/ML IJ SOLN
5000.0000 [IU] | Freq: Three times a day (TID) | INTRAMUSCULAR | Status: DC
  Administered 2013-04-02 – 2013-04-03 (×4): 5000 [IU] via SUBCUTANEOUS
  Filled 2013-04-02 (×8): qty 1

## 2013-04-02 MED ORDER — TICAGRELOR 90 MG PO TABS
90.0000 mg | ORAL_TABLET | Freq: Two times a day (BID) | ORAL | Status: DC
  Administered 2013-04-02 – 2013-04-03 (×3): 90 mg via ORAL
  Filled 2013-04-02 (×5): qty 1

## 2013-04-02 MED ORDER — GLIPIZIDE ER 10 MG PO TB24
10.0000 mg | ORAL_TABLET | Freq: Every day | ORAL | Status: DC
  Administered 2013-04-03: 10 mg via ORAL
  Filled 2013-04-02 (×2): qty 1

## 2013-04-02 MED ORDER — SODIUM CHLORIDE 0.9 % IV BOLUS (SEPSIS)
500.0000 mL | Freq: Once | INTRAVENOUS | Status: AC
  Administered 2013-04-02: 500 mL via INTRAVENOUS

## 2013-04-02 MED ORDER — ACETAMINOPHEN 325 MG PO TABS
650.0000 mg | ORAL_TABLET | Freq: Four times a day (QID) | ORAL | Status: DC | PRN
  Administered 2013-04-02 – 2013-04-03 (×3): 650 mg via ORAL
  Filled 2013-04-02 (×3): qty 2

## 2013-04-02 MED ORDER — ATORVASTATIN CALCIUM 80 MG PO TABS
80.0000 mg | ORAL_TABLET | Freq: Every day | ORAL | Status: DC
  Administered 2013-04-02: 80 mg via ORAL
  Filled 2013-04-02 (×3): qty 1

## 2013-04-02 MED ORDER — INSULIN ASPART 100 UNIT/ML ~~LOC~~ SOLN
0.0000 [IU] | Freq: Three times a day (TID) | SUBCUTANEOUS | Status: DC
  Administered 2013-04-03: 2 [IU] via SUBCUTANEOUS
  Administered 2013-04-03: 07:00:00 via SUBCUTANEOUS

## 2013-04-02 MED ORDER — NITROGLYCERIN 0.4 MG SL SUBL: 0.4000 mg | SUBLINGUAL_TABLET | SUBLINGUAL | Status: DC | PRN

## 2013-04-02 MED ORDER — INSULIN GLARGINE 100 UNIT/ML ~~LOC~~ SOLN
60.0000 [IU] | Freq: Every day | SUBCUTANEOUS | Status: DC
  Administered 2013-04-03: 60 [IU] via SUBCUTANEOUS
  Filled 2013-04-02 (×2): qty 0.6

## 2013-04-02 MED ORDER — TAMSULOSIN HCL 0.4 MG PO CAPS
0.4000 mg | ORAL_CAPSULE | Freq: Every day | ORAL | Status: DC
Start: 2013-04-03 — End: 2013-04-04
  Administered 2013-04-03: 0.4 mg via ORAL
  Filled 2013-04-02 (×2): qty 1

## 2013-04-02 MED ORDER — KCL IN DEXTROSE-NACL 20-5-0.45 MEQ/L-%-% IV SOLN
INTRAVENOUS | Status: DC
  Administered 2013-04-02 – 2013-04-03 (×2): via INTRAVENOUS
  Filled 2013-04-02 (×3): qty 1000

## 2013-04-02 MED ORDER — ISOSORBIDE MONONITRATE ER 30 MG PO TB24
30.0000 mg | ORAL_TABLET | Freq: Every day | ORAL | Status: DC
  Administered 2013-04-03: 30 mg via ORAL
  Filled 2013-04-02 (×2): qty 1

## 2013-04-02 MED ORDER — ASPIRIN EC 81 MG PO TBEC
81.0000 mg | DELAYED_RELEASE_TABLET | Freq: Every day | ORAL | Status: DC
  Administered 2013-04-02 – 2013-04-03 (×2): 81 mg via ORAL
  Filled 2013-04-02 (×3): qty 1

## 2013-04-02 MED ORDER — PREGABALIN 50 MG PO CAPS
75.0000 mg | ORAL_CAPSULE | Freq: Two times a day (BID) | ORAL | Status: DC
  Administered 2013-04-02 – 2013-04-03 (×3): 75 mg via ORAL
  Filled 2013-04-02 (×3): qty 1

## 2013-04-02 MED ORDER — ONDANSETRON HCL 4 MG/2ML IJ SOLN: 4.0000 mg | Freq: Four times a day (QID) | INTRAMUSCULAR | Status: DC | PRN

## 2013-04-02 NOTE — ED Provider Notes (Signed)
History     CSN: [id number]  Arrival date & time 04/02/13  1201   First MD Initiated Contact with Patient 04/02/13 1209      Chief Complaint  Patient presents with  . Weakness  . Hypotension    (Consider location/radiation/quality/duration/timing/severity/associated sxs/prior treatment) Patient is a 79 y.o. male presenting with weakness.  Weakness   Pt with history of CAD reports he was mowing his lawn (riding mower) this morning when he began to have CP and lightheadedness. He went inside where his wife found him sitting in a chair unresponsive. He began to come around after about 5 minutes. She gave him a NTG and called EMS. EMS reports initial BP was 80/50 but improved with IVF enroute. Denies any CP currently, still feeling weak. He reports he had some blood in his urine and ?stool recently, but it had cleared up. None today. Denies any cough or fever. Had MI in Sept 2013 with stents. Wife also reports he has had shingles and post-herpetic neuralgia on the R chest wall over the last several weeks.   Past Medical History  Diagnosis Date  . Hypertension   . Diabetes mellitus 1994    dm edu 2012  . Hyperlipidemia   . Osteoarthritis   . CRI (chronic renal insufficiency)     baseline Cr seems to be 1.7-1.8  . Urinary incontinence     s/p PTNS didn't help  . Glaucoma(365)     and cataracts  . History of melanoma   . CAD (coronary artery disease)     07/2012 acute STEMI, mid LAD PCI - DES; cath 09/2012 patent LAD stent, non-hemodynamically significant Left Main/LAD disease, EF 55%  . Thrombocytopenia   . BCC (basal cell carcinoma of skin)     L neck (Dr. Sherrye Payor)  . Complication of anesthesia     confused after cath 10/15/2012  . CVA (cerebral infarction) 09/2012    remote anterior limb of left internal capsule  . Post herpetic neuralgia     Past Surgical History  Procedure Laterality Date  . Replacement total knee  04/2010    RIGHT KNEE  . Tonsillectomy    .  Cardiovascular stress test  04/27/2010    EF 75%, nuclear stress test with normal perfusion, no ischemia  . Foot surgery      metal pin in place  . Shoulder surgery    . Finger surgery      amputated finger  . Lexiscan myoview  10/2011    negative for ischemia  . Cataract extraction  12/12, 1/13    bilateral  . Coronary stent placement  07/2012    DES to mid LAD for STEMI  . Cardiac catheterization  10/15/2012    Family History  Problem Relation Age of Onset  . Stroke Mother     hemorrhage  . Diabetes Mother   . Cancer Father     lung  . Diabetes Brother     History  Substance Use Topics  . Smoking status: Never Smoker   . Smokeless tobacco: Never Used  . Alcohol Use: No      Review of Systems  Neurological: Positive for weakness.   All other systems reviewed and are negative except as noted in HPI.   Allergies  Januvia; Metformin and related; and Penicillins  Home Medications   Current Outpatient Rx  Name  Route  Sig  Dispense  Refill  . acetaminophen (TYLENOL) 500 MG tablet   Oral   Take 1  tablet (500 mg total) by mouth 2 (two) times daily.   30 tablet   0   . amLODipine (NORVASC) 5 MG tablet      TAKE 1 TABLET BY MOUTH EVERY DAY   90 tablet   3   . aspirin 81 MG tablet   Oral   Take 1 tablet (81 mg total) by mouth daily.   30 tablet      . ciprofloxacin (CIPRO) 250 MG tablet   Oral   Take 1 tablet (250 mg total) by mouth 2 (two) times daily.   14 tablet   0     Use this #   . glipiZIDE (GLUCOTROL) 10 MG tablet   Oral   Take 1 tablet (10 mg total) by mouth daily.   90 tablet   3   . glucose blood test strip      One touch.  Check tid 250.02   100 each   11   . HUMALOG 100 UNIT/ML injection      SLIDING SCALE: 150-200 =2UNITS, 201-250 =4UNITS, 251-300 =6UNITS, 301-350 =8UNITS, >350 =10UNITS   30 mL   3   . HYDROcodone-acetaminophen (NORCO/VICODIN) 5-325 MG per tablet   Oral   Take 1 tablet by mouth every 6 (six) hours as  needed for pain.   60 tablet   0   . insulin glargine (LANTUS) 100 UNIT/ML injection      Inject 70 Units into the skin every morning as directed.   30 mL   4   . insulin lispro (HUMALOG) 100 UNIT/ML injection   Subcutaneous   Inject 5-15 Units into the skin 3 (three) times daily before meals. Sliding scale:  150-200 2 units, 201-250 4 units, 251-300 6 units, 301-350 8 units, >350 10 units, >450 15 units         . Insulin Syringe-Needle U-100 30G X 1/2" 0.3 ML MISC   Does not apply   1 Syringe by Does not apply route as directed.   100 each   3   . isosorbide mononitrate (IMDUR) 30 MG 24 hr tablet   Oral   Take 1 tablet (30 mg total) by mouth daily.   60 tablet   6   . nitroGLYCERIN (NITROSTAT) 0.4 MG SL tablet   Sublingual   Place 1 tablet (0.4 mg total) under the tongue every 5 (five) minutes as needed for chest pain. Up to three doses,if pain continues call 911   25 tablet   3   . pregabalin (LYRICA) 75 MG capsule   Oral   Take 1 capsule (75 mg total) by mouth 2 (two) times daily.   60 capsule   3   . rosuvastatin (CRESTOR) 40 MG tablet   Oral   Take 1 tablet (40 mg total) by mouth daily.   30 tablet   11   . Tamsulosin HCl (FLOMAX) 0.4 MG CAPS   Oral   Take 1 capsule (0.4 mg total) by mouth daily.   90 capsule   3   . Ticagrelor (BRILINTA) 90 MG TABS tablet   Oral   Take 1 tablet (90 mg total) by mouth 2 (two) times daily.   60 tablet   6     BP 122/70  Pulse 82  Temp(Src) 98.3 F (36.8 C) (Oral)  SpO2 94%  Physical Exam  Nursing note and vitals reviewed. Constitutional: He is oriented to person, place, and time. He appears well-developed and well-nourished.  HENT:  Head: Normocephalic  and atraumatic.  Dry membranes  Eyes: EOM are normal. Pupils are equal, round, and reactive to light.  Neck: Normal range of motion. Neck supple.  Cardiovascular: Normal rate, normal heart sounds and intact distal pulses.   Pulmonary/Chest: Effort normal  and breath sounds normal.  Abdominal: Bowel sounds are normal. He exhibits no distension. There is no tenderness.  Genitourinary: Rectum normal. Guaiac negative stool.  Musculoskeletal: Normal range of motion. He exhibits edema (trace bilateral edema). He exhibits no tenderness.  Neurological: He is alert and oriented to person, place, and time. He has normal strength. No cranial nerve deficit or sensory deficit.  Skin: Skin is warm and dry. No rash noted.  Psychiatric: He has a normal mood and affect.    ED Course  Procedures (including critical care time)  Labs Reviewed  CBC WITH DIFFERENTIAL - Abnormal; Notable for the following:    HCT 38.5 (*)    MCHC 36.4 (*)    Platelets 143 (*)    All other components within normal limits  BASIC METABOLIC PANEL - Abnormal; Notable for the following:    Creatinine, Ser 1.63 (*)    GFR calc non Af Amer 38 (*)    GFR calc Af Amer 44 (*)    All other components within normal limits  PRO B NATRIURETIC PEPTIDE - Abnormal; Notable for the following:    Pro B Natriuretic peptide (BNP) 486.5 (*)    All other components within normal limits  CBC - Abnormal; Notable for the following:    HCT 37.9 (*)    MCHC 36.7 (*)    Platelets 146 (*)    All other components within normal limits  CREATININE, SERUM - Abnormal; Notable for the following:    Creatinine, Ser 1.48 (*)    GFR calc non Af Amer 43 (*)    GFR calc Af Amer 50 (*)    All other components within normal limits  BASIC METABOLIC PANEL - Abnormal; Notable for the following:    Glucose, Bld 129 (*)    Creatinine, Ser 1.53 (*)    GFR calc non Af Amer 41 (*)    GFR calc Af Amer 48 (*)    All other components within normal limits  CBC - Abnormal; Notable for the following:    HCT 37.8 (*)    Platelets 141 (*)    All other components within normal limits  GLUCOSE, CAPILLARY - Abnormal; Notable for the following:    Glucose-Capillary 181 (*)    All other components within normal limits   GLUCOSE, CAPILLARY - Abnormal; Notable for the following:    Glucose-Capillary 124 (*)    All other components within normal limits  GLUCOSE, CAPILLARY - Abnormal; Notable for the following:    Glucose-Capillary 131 (*)    All other components within normal limits  GLUCOSE, CAPILLARY - Abnormal; Notable for the following:    Glucose-Capillary 127 (*)    All other components within normal limits  GLUCOSE, CAPILLARY - Abnormal; Notable for the following:    Glucose-Capillary 63 (*)    All other components within normal limits  TROPONIN I  PROTIME-INR  APTT  GLUCOSE, CAPILLARY  TROPONIN I  TROPONIN I  TROPONIN I  GLUCOSE, CAPILLARY  TROPONIN I  GLUCOSE, CAPILLARY  URINALYSIS, ROUTINE W REFLEX MICROSCOPIC  OCCULT BLOOD, POC DEVICE   Dg Chest 2 View  04/02/2013  *RADIOLOGY REPORT*  Clinical Data: Chest pain. Shortness of breath.  Syncope.  CHEST - 2 VIEW  Comparison:  10/15/2012 and 11/30/2004 chest x-ray.  Findings: Pulmonary vascular congestion most notable centrally.  Heart size within normal limits.  Calcified tortuous aorta.  No segmental consolidation or pneumothorax.  IMPRESSION: Pulmonary vascular congestion most notable centrally.  Calcified tortuous aorta.   Original Report Authenticated By: Genia Del, M.D.    Ct Head Wo Contrast  04/02/2013  *RADIOLOGY REPORT*  Clinical Data: Syncope.  Right-sided weakness.  Numbness since this morning.  Denies headache.  History of myocardial infarction. Diabetic hypertensive patient with hyperlipidemia.  History melanoma.  CT HEAD WITHOUT CONTRAST  Technique:  Contiguous axial images were obtained from the base of the skull through the vertex without contrast.  Comparison: 10/16/2012.  Findings: No intracranial hemorrhage.  Small vessel disease type changes without CT evidence of large acute infarct.  Global atrophy without hydrocephalus.  No intracranial mass lesion detected on this unenhanced exam.  Vascular calcifications.  IMPRESSION: No  intracranial hemorrhage or CT evidence of large acute infarct.  Small vessel disease type changes.  Mild atrophy without hydrocephalus.   Original Report Authenticated By: Genia Del, M.D.      1. Syncope   2. Acute on chronic systolic CHF (congestive heart failure)   3. CAD (coronary artery disease)   4. CRI (chronic renal insufficiency)   5. DM type 2 causing CKD stage 3   6. HTN (hypertension)   7. Post herpetic neuralgia   8. Type 2 diabetes, uncontrolled, with neuropathy   9. Benign hypertension with CKD (chronic kidney disease) stage III       MDM   Date: 04/02/2013  Rate: 80  Rhythm: normal sinus rhythm  QRS Axis: left  Intervals: PR prolonged  ST/T Wave abnormalities: normal  Conduction Disutrbances:first-degree A-V block  and nonspecific intraventricular conduction delay  Narrative Interpretation:   Old EKG Reviewed: unchanged   Pt with syncope, unclear etiology. Not orthostatic now. Admit to Hospitalist for further evaluation.       Amy Gothard B. Karle Starch, MD 04/04/13 3532

## 2013-04-02 NOTE — H&P (Signed)
[facility] History and Physical  Frank Simpson EEF:[id number] DOB: 1931/03/18 DOA: 04/02/2013  Referring physician: Karle Starch PCP: [name], MD  Specialists: none  Chief Complaint: fainted  HPI: Frank Simpson is a 79 y.o. male  With PMH as listed below but pertinent for recent diagnosis of herpes zoster with post herpetic neuralgia across his chest.  States that he was mowing the lawn under the sun without drinking much fluid and when he went inside his house and sat down he passed out.  His wife states that he was down for 4-5 minutes and then regained consciousness.  There is no reports of seizure like activity or bowel or bladder incontinence.  The problem happened insidiously and resolved with rest.   The patient had been complaining of chest pain but this is not new and is mainly on his right chest exactly where his herpetic neuralgia has been.    In the ED reportedly patient had a blood pressure in the field of 77/47 ( after wife administered nitro) although ED reports that he had not been hypotensive while in the ED.  Orthostatic vital signs were not drawn prior to rehydration nor during the ED and patient was given only a 500 cc fluid bolus prior to contacting us for admission evaluation and recommendations.  Review of Systems: 10 point review of system reviewed and negative unless otherwise mentioned above.  Past Medical History  Diagnosis Date  . Hypertension   . Diabetes mellitus 1994    dm edu 2012  . Hyperlipidemia   . Osteoarthritis   . CRI (chronic renal insufficiency)     baseline Cr seems to be 1.7-1.8  . Urinary incontinence     s/p PTNS didn't help  . Glaucoma(365)     and cataracts  . History of melanoma   . CAD (coronary artery disease)     07/2012 acute STEMI, mid LAD PCI - DES; cath 09/2012 patent LAD stent, non-hemodynamically significant Left Main/LAD disease, EF 55%  . Thrombocytopenia   . BCC (basal cell carcinoma of skin)     L neck (Dr.  Sherrye Payor)  . Complication of anesthesia     confused after cath 10/15/2012  . CVA (cerebral infarction) 09/2012    remote anterior limb of left internal capsule  . Post herpetic neuralgia    Past Surgical History  Procedure Laterality Date  . Replacement total knee  04/2010    RIGHT KNEE  . Tonsillectomy    . Cardiovascular stress test  04/27/2010    EF 75%, nuclear stress test with normal perfusion, no ischemia  . Foot surgery      metal pin in place  . Shoulder surgery    . Finger surgery      amputated finger  . Lexiscan myoview  10/2011    negative for ischemia  . Cataract extraction  12/12, 1/13    bilateral  . Coronary stent placement  07/2012    DES to mid LAD for STEMI  . Cardiac catheterization  10/15/2012   Social History:  reports that he has never smoked. He has never used smokeless tobacco. He reports that he does not drink alcohol or use illicit drugs.  where does patient live--home, ALF, SNF? and with whom if at home? Lives at home with wife  Can patient participate in ADLs? yes  Allergies  Allergen Reactions  . Januvia (Sitagliptin Phosphate) Other (See Comments)    Possibly affected kidneys?  . Metformin And Related Other (See Comments)  Affected kidneys  . Penicillins Other (See Comments)    Reaction long time ago - doesn't remember    Family History  Problem Relation Age of Onset  . Stroke Mother     hemorrhage  . Diabetes Mother   . Cancer Father     lung  . Diabetes Brother   None other reported  Prior to Admission medications   Medication Sig Start Date End Date Taking? Authorizing Provider  amLODipine (NORVASC) 5 MG tablet TAKE 1 TABLET BY MOUTH EVERY DAY 09/15/12  Yes Darlin Coco, MD  aspirin 81 MG tablet Take 1 tablet (81 mg total) by mouth daily. 08/09/12 08/09/13 Yes Rhonda G Barrett, PA-C  glipiZIDE (GLUCOTROL XL) 10 MG 24 hr tablet Take 10 mg by mouth daily.   Yes Historical Provider, MD  HUMALOG 100 UNIT/ML injection SLIDING  SCALE: 150-200 =2UNITS, 201-250 =4UNITS, 251-300 =6UNITS, 301-350 =8UNITS, >350 =10UNITS 03/26/13  Yes [name], MD  insulin glargine (LANTUS) 100 UNIT/ML injection Inject 70 Units into the skin every morning as directed. 01/14/13  Yes [name], MD  isosorbide mononitrate (IMDUR) 30 MG 24 hr tablet Take 1 tablet (30 mg total) by mouth daily. 10/16/12  Yes Jessica A Hope, PA-C  nitroGLYCERIN (NITROSTAT) 0.4 MG SL tablet Place 1 tablet (0.4 mg total) under the tongue every 5 (five) minutes as needed for chest pain. Up to three doses,if pain continues call 911 08/09/12 08/09/13 Yes Rhonda G Barrett, PA-C  pregabalin (LYRICA) 75 MG capsule Take 1 capsule (75 mg total) by mouth 2 (two) times daily. 03/12/13  Yes [name], MD  rosuvastatin (CRESTOR) 40 MG tablet Take 1 tablet (40 mg total) by mouth daily. 08/09/12 08/09/13 Yes Rhonda G Barrett, PA-C  Tamsulosin HCl (FLOMAX) 0.4 MG CAPS Take 1 capsule (0.4 mg total) by mouth daily. 11/13/12  Yes Darlin Coco, MD  Ticagrelor (BRILINTA) 90 MG TABS tablet Take 1 tablet (90 mg total) by mouth 2 (two) times daily. 10/16/12  Yes Martin, PA-C  glucose blood test strip One touch.  Check tid 250.02 11/05/12   [name], MD  Insulin Syringe-Needle U-100 30G X 1/2" 0.3 ML MISC 1 Syringe by Does not apply route as directed. 01/14/13   [name], MD   Physical Exam: Filed Vitals:   04/02/13 1515 04/02/13 1533 04/02/13 1545 04/02/13 1631  BP:  145/74 145/74 128/69  Pulse: 76  74 71  Temp:   97.7 F (36.5 C) 97.6 F (36.4 C)  TempSrc:   Oral   Resp:   20 17  Height:    6' (1.829 m)  Weight:    99.791 kg (220 lb)  SpO2: 95%  96% 96%     General:  Pt in NAD, Alert and awake  Eyes: EOMI, non icteric  ENT: normal exterior appearance, dry mucous membranes  Neck: supple, no goiter  Cardiovascular: RRR, No murmurs  Respiratory: CTA BL, no wheezes  Abdomen: soft, NT, ND  Skin: warm and dry  Musculoskeletal: no  cyanosis or clubbing  Psychiatric: mood and affect appropriate  Neurologic: moves all extremities and   Labs on Admission:  Basic Metabolic Panel:  Recent Labs Lab 04/02/13 1258  NA 141  K 3.8  CL 108  CO2 25  GLUCOSE 85  BUN 16  CREATININE 1.63*  CALCIUM 8.8   Liver Function Tests: No results found for this basename: AST, ALT, ALKPHOS, BILITOT, PROT, ALBUMIN,  in the last 168 hours No results found for this basename: LIPASE,  AMYLASE,  in the last 168 hours No results found for this basename: AMMONIA,  in the last 168 hours CBC:  Recent Labs Lab 04/02/13 1258  WBC 8.8  NEUTROABS 6.7  HGB 14.0  HCT 38.5*  MCV 84.8  PLT 143*   Cardiac Enzymes:  Recent Labs Lab 04/02/13 1258  TROPONINI <0.30    BNP (last 3 results)  Recent Labs  10/15/12 0628 04/02/13 1258  PROBNP 778.0* 486.5*   CBG:  Recent Labs Lab 04/02/13 1234  GLUCAP 91    Radiological Exams on Admission: Dg Chest 2 View  04/02/2013  *RADIOLOGY REPORT*  Clinical Data: Chest pain. Shortness of breath.  Syncope.  CHEST - 2 VIEW  Comparison: 10/15/2012 and 11/30/2004 chest x-ray.  Findings: Pulmonary vascular congestion most notable centrally.  Heart size within normal limits.  Calcified tortuous aorta.  No segmental consolidation or pneumothorax.  IMPRESSION: Pulmonary vascular congestion most notable centrally.  Calcified tortuous aorta.   Original Report Authenticated By: Genia Del, M.D.    Ct Head Wo Contrast  04/02/2013  *RADIOLOGY REPORT*  Clinical Data: Syncope.  Right-sided weakness.  Numbness since this morning.  Denies headache.  History of myocardial infarction. Diabetic hypertensive patient with hyperlipidemia.  History melanoma.  CT HEAD WITHOUT CONTRAST  Technique:  Contiguous axial images were obtained from the base of the skull through the vertex without contrast.  Comparison: 10/16/2012.  Findings: No intracranial hemorrhage.  Small vessel disease type changes without CT evidence of  large acute infarct.  Global atrophy without hydrocephalus.  No intracranial mass lesion detected on this unenhanced exam.  Vascular calcifications.  IMPRESSION: No intracranial hemorrhage or CT evidence of large acute infarct.  Small vessel disease type changes.  Mild atrophy without hydrocephalus.   Original Report Authenticated By: Genia Del, M.D.     EKG: Independently reviewed. NOrmal sinus rhythm with 1rst degree av block, No ST elevations or depressions  Assessment/Plan Active Problems:   1. Syncope - Given history suspecting orthostatic hypotension - rehydrate carefully given that patient was working under hot sun and has history of CHF - recommend reassessing fluid status next am - Echocardiogram, carotid doppler, telemetry monitoring - troponin monitoring - CT of head with no intracranial hemorrhage or large acute infarct - No history consistent with seizure like activity will not order EEG and will defer to rounding physician next am.  2. Chest pain - troponin negative and likely due to post herpetic neuralgia - more so for syncope will draw troponins x 3 - Continue lyrica  3. CAD - stable continue statin, brilinta, and aspirin  4. CKD stage 3 - Patient at baseline - will reassess next am  5. DM - glipizide, lantus, SSI - diabetic diet - monitor cBG's   Code Status: full  Family Communication: No family at bedside. Disposition Plan: Pending further work up  Time spent: > 70 minutes  Marce Schartz, [facility] Pager [phone]  If 7PM-7AM, please contact night-coverage [email or web address] Password [facility] 04/02/2013, 5:35 PM

## 2013-04-02 NOTE — ED Notes (Signed)
Per EMS: pt c/o weakness after mowing lawn.77/47 initial BP. Pt given 400 cc saline; last BP19/74; HR 80 RR 16 oxygen 97% on 2 LNC CBG 118. 18 G in right hand. Pt denies pain and shortness of breath. Pt alert and oriented. Pt states last week noticed blood in stool. Hx of MI and stent placed.

## 2013-04-03 ENCOUNTER — Telehealth: Payer: Self-pay | Admitting: *Deleted

## 2013-04-03 DIAGNOSIS — I129 Hypertensive chronic kidney disease with stage 1 through stage 4 chronic kidney disease, or unspecified chronic kidney disease: Secondary | ICD-10-CM | POA: Diagnosis not present

## 2013-04-03 DIAGNOSIS — I5023 Acute on chronic systolic (congestive) heart failure: Secondary | ICD-10-CM | POA: Diagnosis not present

## 2013-04-03 DIAGNOSIS — I369 Nonrheumatic tricuspid valve disorder, unspecified: Secondary | ICD-10-CM | POA: Diagnosis not present

## 2013-04-03 DIAGNOSIS — I2510 Atherosclerotic heart disease of native coronary artery without angina pectoris: Secondary | ICD-10-CM | POA: Diagnosis not present

## 2013-04-03 DIAGNOSIS — R55 Syncope and collapse: Secondary | ICD-10-CM | POA: Diagnosis not present

## 2013-04-03 LAB — BASIC METABOLIC PANEL
BUN: 16 mg/dL (ref 6–23)
Calcium: 8.7 mg/dL (ref 8.4–10.5)
Creatinine, Ser: 1.53 mg/dL — ABNORMAL HIGH (ref 0.50–1.35)
GFR calc non Af Amer: 41 mL/min — ABNORMAL LOW (ref >90)
Glucose, Bld: 129 mg/dL — ABNORMAL HIGH (ref 70–99)
Sodium: 139 mEq/L (ref 135–145)

## 2013-04-03 LAB — CBC
MCH: 30.5 pg (ref 26.0–34.0)
MCHC: 35.7 g/dL (ref 30.0–36.0)
MCV: 85.5 fL (ref 78.0–100.0)
Platelets: 141 10*3/uL — ABNORMAL LOW (ref 150–400)

## 2013-04-03 LAB — GLUCOSE, CAPILLARY
Glucose-Capillary: 124 mg/dL — ABNORMAL HIGH (ref 70–99)
Glucose-Capillary: 127 mg/dL — ABNORMAL HIGH (ref 70–99)
Glucose-Capillary: 63 mg/dL — ABNORMAL LOW (ref 70–99)

## 2013-04-03 LAB — TROPONIN I: Troponin I: <0.3 ng/mL (ref <0.30)

## 2013-04-03 MED ORDER — TRAMADOL HCL 50 MG PO TABS: 50.0000 mg | ORAL_TABLET | Freq: Four times a day (QID) | ORAL | Status: DC | PRN

## 2013-04-03 MED ORDER — HALOPERIDOL LACTATE 5 MG/ML IJ SOLN
1.0000 mg | Freq: Four times a day (QID) | INTRAMUSCULAR | Status: DC | PRN
  Filled 2013-04-03: qty 0.2

## 2013-04-03 MED ORDER — OXYCODONE HCL 5 MG PO TABS
5.0000 mg | ORAL_TABLET | ORAL | Status: DC | PRN
  Administered 2013-04-03: 5 mg via ORAL
  Filled 2013-04-03: qty 1

## 2013-04-03 NOTE — Telephone Encounter (Signed)
One was not done at hospital - so I'd like him to come in for lab visit and drop off sample.

## 2013-04-03 NOTE — Progress Notes (Addendum)
[facility] PROGRESS NOTE  Frank Simpson [id number] DOB: Dec 14, 1930 DOA: 04/02/2013 PCP: [name], MD  Assessment/Plan: 1. Syncope - probably vaso vagal. Echo with depressed Ef but no arrythmia noted. Will refer to cards outpt  2. Hypoglycemia - stop SU. C/w lantus  3. CAD - cont prior to admission regimen  4. PHN - patient was given oxycodone and he became delirious. - we will monitor closely    Code Status: full   HPI/Subjective: Does not feel well   Objective: Filed Vitals:   04/02/13 1631 04/02/13 2106 04/03/13 0512 04/03/13 1401  BP: 128/69 102/50 135/62 103/53  Pulse: 71 56 51 61  Temp: 97.6 F (36.4 C) 97.7 F (36.5 C) 97.8 F (36.6 C) 98.1 F (36.7 C)  TempSrc:  Oral Oral Oral  Resp: _0 Height: 6' (1.829 m)     Weight: 99.791 kg (220 lb)     SpO2: 96% 98% 96% 95%    Intake/Output Summary (Last 24 hours) at 04/03/13 1444 Last data filed at 04/03/13 1230  Gross per 24 hour  Intake 1437.5 ml  Output   1050 ml  Net  387.5 ml   Filed Weights   04/02/13 1631  Weight: 99.791 kg (220 lb)    Exam:   General:  axox  Cardiovascular: rrr  Respiratory: ctab   Abdomen: soft  Musculoskeletal: intact    Data Reviewed: Basic Metabolic Panel:  Recent Labs Lab 04/02/13 1258 04/02/13 1840 04/03/13 0543  NA 141  --  139  K 3.8  --  4.5  CL 108  --  107  CO2 25  --  26  GLUCOSE 85  --  129*  BUN 16  --  16  CREATININE 1.63* 1.48* 1.53*  CALCIUM 8.8  --  8.7   Liver Function Tests: No results found for this basename: AST, ALT, ALKPHOS, BILITOT, PROT, ALBUMIN,  in the last 168 hours No results found for this basename: LIPASE, AMYLASE,  in the last 168 hours No results found for this basename: AMMONIA,  in the last 168 hours CBC:  Recent Labs Lab 04/02/13 1258 04/02/13 1840 04/03/13 0543  WBC 8.8 6.9 4.9  NEUTROABS 6.7  --   --   HGB 14.0 13.9 13.5  HCT 38.5* 37.9* 37.8*  MCV 84.8 85.2 85.5  PLT 143* 146* 141*    Cardiac Enzymes:  Recent Labs Lab 04/02/13 1258 04/02/13 1840 04/02/13 2350 04/03/13 0544 04/03/13 1254  TROPONINI <0.30 <0.30 <0.30 <0.30 <0.30   BNP (last 3 results)  Recent Labs  10/15/12 0628 04/02/13 1258  PROBNP 778.0* 486.5*   CBG:  Recent Labs Lab 04/02/13 1234 04/02/13 1738 04/02/13 2058 04/03/13 0559 04/03/13 1138  GLUCAP 91 94 181* 124* 131*    No results found for this or any previous visit (from the past 240 hour(s)).   Studies: Dg Chest 2 View  04/02/2013  *RADIOLOGY REPORT*  Clinical Data: Chest pain. Shortness of breath.  Syncope.  CHEST - 2 VIEW  Comparison: 10/15/2012 and 11/30/2004 chest x-ray.  Findings: Pulmonary vascular congestion most notable centrally.  Heart size within normal limits.  Calcified tortuous aorta.  No segmental consolidation or pneumothorax.  IMPRESSION: Pulmonary vascular congestion most notable centrally.  Calcified tortuous aorta.   Original Report Authenticated By: Genia Del, M.D.    Ct Head Wo Contrast  04/02/2013  *RADIOLOGY REPORT*  Clinical Data: Syncope.  Right-sided weakness.  Numbness since this morning.  Denies headache.  History of myocardial  infarction. Diabetic hypertensive patient with hyperlipidemia.  History melanoma.  CT HEAD WITHOUT CONTRAST  Technique:  Contiguous axial images were obtained from the base of the skull through the vertex without contrast.  Comparison: 10/16/2012.  Findings: No intracranial hemorrhage.  Small vessel disease type changes without CT evidence of large acute infarct.  Global atrophy without hydrocephalus.  No intracranial mass lesion detected on this unenhanced exam.  Vascular calcifications.  IMPRESSION: No intracranial hemorrhage or CT evidence of large acute infarct.  Small vessel disease type changes.  Mild atrophy without hydrocephalus.   Original Report Authenticated By: Genia Del, M.D.     Scheduled Meds: . aspirin EC  81 mg Oral Daily  . atorvastatin  80 mg Oral q1800  .  heparin  5,000 Units Subcutaneous Q8H  . insulin aspart  0-15 Units Subcutaneous TID WC  . insulin glargine  60 Units Subcutaneous Daily  . isosorbide mononitrate  30 mg Oral Daily  . pregabalin  75 mg Oral BID  . sodium chloride  3 mL Intravenous Q12H  . tamsulosin  0.4 mg Oral Daily  . Ticagrelor  90 mg Oral BID   Continuous Infusions:   Active Problems:   Type 2 diabetes, uncontrolled, with neuropathy   HTN (hypertension)   CAD (coronary artery disease)   Benign hypertension with CKD (chronic kidney disease) stage III   Post herpetic neuralgia   Syncope    Kristel Durkee  [facility] Pager [phone]. If 7PM-7AM, please contact night-coverage at [email or web address], password [facility] 04/03/2013, 2:44 PM  LOS: 1 day

## 2013-04-03 NOTE — Progress Notes (Signed)
Pt received 5 mg po oxycodone at 1118 today for c/o of right sided abdominal pain. Pt became increasingly agitated and restless throughout day. Pt was alert to self only. Wife called and made aware of situation. Wife to come to hospital soon. Dr. Marye Round aware of situation and discontinued oxycodone and ordered haldol PRN. Pt now resting in bed with bed alarm in place. Frank Simpson

## 2013-04-03 NOTE — Telephone Encounter (Signed)
Patient's wife called and advised he was admitted for syncopal episode yesterday. He was scheduled here for repeat UA tomorrow and she was calling to see if he needed to reschedule that appt or just not worry about it. I wasn't sure if you could use the urine results they would get at the hospital or if you wanted one of your own here, so I told her I would call her back and let her know.

## 2013-04-03 NOTE — Progress Notes (Signed)
VASCULAR LAB PRELIMINARY  PRELIMINARY  PRELIMINARY  PRELIMINARY  Carotid duplex  completed.    Preliminary report:  Bilateral:  No evidence of hemodynamically significant internal carotid artery stenosis.   Vertebral artery flow is antegrade.      Georges Victorio, RVT 04/03/2013, 11:56 AM

## 2013-04-03 NOTE — Progress Notes (Signed)
  Echocardiogram 2D Echocardiogram has been performed.  Takelia Urieta, [place] 04/03/2013, 10:58 AM

## 2013-04-03 NOTE — Telephone Encounter (Signed)
Message left advising patient/patient's wife to call and schedule nurse visit for UA when he is feeling better and home from the hospital.

## 2013-04-04 ENCOUNTER — Ambulatory Visit: Payer: Medicare Other

## 2013-04-04 ENCOUNTER — Ambulatory Visit (HOSPITAL_COMMUNITY): Payer: Medicare Other

## 2013-04-04 DIAGNOSIS — I5023 Acute on chronic systolic (congestive) heart failure: Secondary | ICD-10-CM | POA: Diagnosis not present

## 2013-04-04 DIAGNOSIS — R55 Syncope and collapse: Secondary | ICD-10-CM | POA: Diagnosis not present

## 2013-04-04 DIAGNOSIS — I2510 Atherosclerotic heart disease of native coronary artery without angina pectoris: Secondary | ICD-10-CM | POA: Diagnosis not present

## 2013-04-04 DIAGNOSIS — I129 Hypertensive chronic kidney disease with stage 1 through stage 4 chronic kidney disease, or unspecified chronic kidney disease: Secondary | ICD-10-CM | POA: Diagnosis not present

## 2013-04-04 LAB — URINALYSIS, ROUTINE W REFLEX MICROSCOPIC
Glucose, UA: 500 mg/dL — AB
Leukocytes, UA: NEGATIVE
Protein, ur: NEGATIVE mg/dL
Specific Gravity, Urine: 1.016 (ref 1.005–1.030)

## 2013-04-04 LAB — GLUCOSE, CAPILLARY: Glucose-Capillary: 85 mg/dL (ref 70–99)

## 2013-04-04 MED ORDER — TRAMADOL HCL 50 MG PO TABS: 50.0000 mg | ORAL_TABLET | Freq: Four times a day (QID) | ORAL | Status: DC | PRN

## 2013-04-04 MED ORDER — GABAPENTIN 300 MG PO CAPS: 300.0000 mg | ORAL_CAPSULE | Freq: Three times a day (TID) | ORAL | Status: DC

## 2013-04-04 MED ORDER — ACETAMINOPHEN 500 MG PO TABS: 500.0000 mg | ORAL_TABLET | Freq: Four times a day (QID) | ORAL | Status: DC | PRN

## 2013-04-04 NOTE — Discharge Summary (Signed)
Physician Discharge Summary  Frank Simpson [id number]:[id number] DOB: 04-Oct-1931 DOA: 04/02/2013  PCP: [name], MD  Admit date: 04/02/2013 Discharge date: 04/04/2013  Time spent: 45 minutes  Recommendations for Outpatient Follow-up:  1. Recommended followup with neurology to discuss possibility of loss of consciousness episodes being seizures   Discharge Diagnoses:  Syncope vs seizure   Type 2 diabetes, uncontrolled, with neuropathy - glipizide stopped   HTN (hypertension)   CAD (coronary artery disease)   Benign hypertension with CKD (chronic kidney disease) stage III   Post herpetic neuralgia    Discharge Condition: Good  Diet recommendation: Diabetic  Filed Weights   04/02/13 1631  Weight: 99.791 kg (220 lb)    History of present illness:  Frank Simpson is a 79 y.o. male  With PMH as listed below but pertinent for recent diagnosis of herpes zoster with post herpetic neuralgia across his chest. States that he was mowing the lawn under the sun without drinking much fluid and when he went inside his house and sat down he passed out. His wife states that he was down for 4-5 minutes and then regained consciousness. There is no reports of seizure like activity or bowel or bladder incontinence. The problem happened insidiously and resolved with rest.  The patient had been complaining of chest pain but this is not new and is mainly on his right chest exactly where his herpetic neuralgia has been.  In the ED reportedly patient had a blood pressure in the field of 77/47 ( after wife administered nitro) although ED reports that he had not been hypotensive while in the ED. Orthostatic vital signs were not drawn prior to rehydration nor during the ED and patient was given only a 500 cc fluid bolus prior to contacting us for admission evaluation and recommendations.      Hospital Course:  1. Syncope - patient was admitted in the hospital and was placed on telemetry. We did not notice  any arrhythmia. His echocardiogram remained essentially unchanged from September 2013. Carotid ultrasound was negative for significant carotid stenosis. We did notice that the patient was having some hypoglycemia and we discontinued his sulfonylurea. It is conceivable that his syncopal event was related to hypoglycemia. I have also recommended to the wife that she keeps a log of the episodes of altered mental status and she writes down which medications the patient received recently prior to the events. I have advised the patient not to drive and to followup with neurologist to discuss if it's worthwhile to try an empiric anticonvulsant. Please note that the patient is already taking Neurontin for postherpetic neuralgia.  Procedures:  Echocardiogram  Head CT  Carotid Dopplers  Consultations:  None  Discharge Exam: Filed Vitals:   04/03/13 0512 04/03/13 1401 04/03/13 2042 04/04/13 0411  BP: 135/62 103/53 148/80 138/62  Pulse: 51 61 68 55  Temp: 97.8 F (36.6 C) 98.1 F (36.7 C) 97.9 F (36.6 C) 97.2 F (36.2 C)  TempSrc: Oral Oral  Oral  Resp: _0 Height:      Weight:      SpO2: 96% 95% 98% 98%    General: Alert and oriented x3 Cardiovascular: Regular rate and rhythm Respiratory: Clear to auscultation bilaterally  Discharge Instructions  Discharge Orders   Future Appointments Provider Department Dept Phone   04/16/2013 11:00 AM Marcial Pacas, MD [facility] [phone]   04/23/2013 8:40 AM [facility]) [phone]   04/23/2013 9:15 AM  Darlin Coco, MD [facility]) [phone]   Future Orders Complete By Expires     Diet - low sodium heart healthy  As directed     Increase activity slowly  As directed     Scheduling Instructions:      Do not drive        Medication List    STOP taking these medications       glipiZIDE 10 MG 24 hr tablet  Commonly known as:  GLUCOTROL XL      glucose blood test strip     Insulin Syringe-Needle U-100 30G X 1/2" 0.3 ML Misc     pregabalin 75 MG capsule  Commonly known as:  LYRICA      TAKE these medications       acetaminophen 500 MG tablet  Commonly known as:  TYLENOL  Take 1 tablet (500 mg total) by mouth every 6 (six) hours as needed for pain.     amLODipine 5 MG tablet  Commonly known as:  NORVASC  TAKE 1 TABLET BY MOUTH EVERY DAY     aspirin 81 MG tablet  Take 1 tablet (81 mg total) by mouth daily.     gabapentin 300 MG capsule  Commonly known as:  NEURONTIN  Take 1 capsule (300 mg total) by mouth 3 (three) times daily.     HUMALOG 100 UNIT/ML injection  Generic drug:  insulin lispro  SLIDING SCALE: 150-200 =2UNITS, 201-250 =4UNITS, 251-300 =6UNITS, 301-350 =8UNITS, >350 =10UNITS     insulin glargine 100 UNIT/ML injection  Commonly known as:  LANTUS  Inject 70 Units into the skin every morning as directed.     isosorbide mononitrate 30 MG 24 hr tablet  Commonly known as:  IMDUR  Take 1 tablet (30 mg total) by mouth daily.     nitroGLYCERIN 0.4 MG SL tablet  Commonly known as:  NITROSTAT  Place 1 tablet (0.4 mg total) under the tongue every 5 (five) minutes as needed for chest pain. Up to three doses,if pain continues call 911     rosuvastatin 40 MG tablet  Commonly known as:  CRESTOR  Take 1 tablet (40 mg total) by mouth daily.     tamsulosin 0.4 MG Caps  Commonly known as:  FLOMAX  Take 1 capsule (0.4 mg total) by mouth daily.     Ticagrelor 90 MG Tabs tablet  Commonly known as:  BRILINTA  Take 1 tablet (90 mg total) by mouth 2 (two) times daily.     traMADol 50 MG tablet  Commonly known as:  ULTRAM  Take 1 tablet (50 mg total) by mouth every 6 (six) hours as needed for pain.       Allergies  Allergen Reactions  . Januvia (Sitagliptin Phosphate) Other (See Comments)    Possibly affected kidneys?  . Metformin And Related Other (See Comments)    Affected kidneys  . Oxycodone      Severe confusion  . Penicillins Other (See Comments)    Reaction long time ago - doesn't remember       Follow-up Information   Schedule an appointment as soon as possible for a visit with [name], MD.   Contact information:   [place] [address] [phone]       Follow up with Marcial Pacas, MD On 04/16/2013. (11 am)    Contact information:   [place] [address] [phone]  The results of significant diagnostics from this hospitalization (including imaging, microbiology, ancillary and laboratory) are listed below for reference.    Significant Diagnostic Studies: Dg Chest 2 View  04/02/2013  *RADIOLOGY REPORT*  Clinical Data: Chest pain. Shortness of breath.  Syncope.  CHEST - 2 VIEW  Comparison: 10/15/2012 and 11/30/2004 chest x-ray.  Findings: Pulmonary vascular congestion most notable centrally.  Heart size within normal limits.  Calcified tortuous aorta.  No segmental consolidation or pneumothorax.  IMPRESSION: Pulmonary vascular congestion most notable centrally.  Calcified tortuous aorta.   Original Report Authenticated By: Genia Del, M.D.    Ct Head Wo Contrast  04/02/2013  *RADIOLOGY REPORT*  Clinical Data: Syncope.  Right-sided weakness.  Numbness since this morning.  Denies headache.  History of myocardial infarction. Diabetic hypertensive patient with hyperlipidemia.  History melanoma.  CT HEAD WITHOUT CONTRAST  Technique:  Contiguous axial images were obtained from the base of the skull through the vertex without contrast.  Comparison: 10/16/2012.  Findings: No intracranial hemorrhage.  Small vessel disease type changes without CT evidence of large acute infarct.  Global atrophy without hydrocephalus.  No intracranial mass lesion detected on this unenhanced exam.  Vascular calcifications.  IMPRESSION: No intracranial hemorrhage or CT evidence of large acute infarct.  Small vessel disease type changes.  Mild  atrophy without hydrocephalus.   Original Report Authenticated By: Genia Del, M.D.     Microbiology: No results found for this or any previous visit (from the past 240 hour(s)).   Labs: Basic Metabolic Panel:  Recent Labs Lab 04/02/13 1258 04/02/13 1840 04/03/13 0543  NA 141  --  139  K 3.8  --  4.5  CL 108  --  107  CO2 25  --  26  GLUCOSE 85  --  129*  BUN 16  --  16  CREATININE 1.63* 1.48* 1.53*  CALCIUM 8.8  --  8.7   Liver Function Tests: No results found for this basename: AST, ALT, ALKPHOS, BILITOT, PROT, ALBUMIN,  in the last 168 hours No results found for this basename: LIPASE, AMYLASE,  in the last 168 hours No results found for this basename: AMMONIA,  in the last 168 hours CBC:  Recent Labs Lab 04/02/13 1258 04/02/13 1840 04/03/13 0543  WBC 8.8 6.9 4.9  NEUTROABS 6.7  --   --   HGB 14.0 13.9 13.5  HCT 38.5* 37.9* 37.8*  MCV 84.8 85.2 85.5  PLT 143* 146* 141*   Cardiac Enzymes:  Recent Labs Lab 04/02/13 1258 04/02/13 1840 04/02/13 2350 04/03/13 0544 04/03/13 1254  TROPONINI <0.30 <0.30 <0.30 <0.30 <0.30   BNP: BNP (last 3 results)  Recent Labs  10/15/12 0628 04/02/13 1258  PROBNP 778.0* 486.5*   CBG:  Recent Labs Lab 04/03/13 0559 04/03/13 1138 04/03/13 1624 04/03/13 2041 04/04/13 0617  GLUCAP 124* 131* 127* 63* 85       Signed:  Willine Schwalbe  [facility] 04/04/2013, 9:34 AM

## 2013-04-05 ENCOUNTER — Encounter: Payer: Self-pay | Admitting: Family Medicine

## 2013-04-07 ENCOUNTER — Telehealth: Payer: Self-pay | Admitting: Family Medicine

## 2013-04-07 NOTE — Telephone Encounter (Signed)
Noted recent hospitalization for syncope. I see pt has scheduled f/u with neurology. plz call to see how pt is s/p discharge, ensure no further syncopal events, and offer appt with me in next 1-2 weeks for f/u UA and recheck post-herpetic neuralgia as well as hosp f/u.

## 2013-04-08 NOTE — Telephone Encounter (Signed)
Spoke with patient's wife. No further syncopal episodes since discharge. Follow up scheduled.

## 2013-04-16 ENCOUNTER — Ambulatory Visit (INDEPENDENT_AMBULATORY_CARE_PROVIDER_SITE_OTHER): Payer: Medicare Other | Admitting: Neurology

## 2013-04-16 ENCOUNTER — Encounter: Payer: Self-pay | Admitting: Neurology

## 2013-04-16 VITALS — BP 148/74 | HR 82 | Ht 70.0 in | Wt 200.0 lb

## 2013-04-16 DIAGNOSIS — R5381 Other malaise: Secondary | ICD-10-CM

## 2013-04-16 DIAGNOSIS — E1149 Type 2 diabetes mellitus with other diabetic neurological complication: Secondary | ICD-10-CM | POA: Diagnosis not present

## 2013-04-16 DIAGNOSIS — N183 Hypertensive chronic kidney disease with stage 1 through stage 4 chronic kidney disease, or unspecified chronic kidney disease: Secondary | ICD-10-CM

## 2013-04-16 DIAGNOSIS — E1142 Type 2 diabetes mellitus with diabetic polyneuropathy: Secondary | ICD-10-CM | POA: Diagnosis not present

## 2013-04-16 DIAGNOSIS — E1310 Other specified diabetes mellitus with ketoacidosis without coma: Secondary | ICD-10-CM

## 2013-04-16 DIAGNOSIS — E1110 Type 2 diabetes mellitus with ketoacidosis without coma: Secondary | ICD-10-CM

## 2013-04-16 DIAGNOSIS — B0229 Other postherpetic nervous system involvement: Secondary | ICD-10-CM | POA: Diagnosis not present

## 2013-04-16 DIAGNOSIS — R5383 Other fatigue: Secondary | ICD-10-CM

## 2013-04-16 DIAGNOSIS — I5023 Acute on chronic systolic (congestive) heart failure: Secondary | ICD-10-CM

## 2013-04-16 DIAGNOSIS — R413 Other amnesia: Secondary | ICD-10-CM | POA: Diagnosis not present

## 2013-04-16 DIAGNOSIS — I200 Unstable angina: Secondary | ICD-10-CM | POA: Diagnosis not present

## 2013-04-16 DIAGNOSIS — IMO0002 Reserved for concepts with insufficient information to code with codable children: Secondary | ICD-10-CM

## 2013-04-16 DIAGNOSIS — E1165 Type 2 diabetes mellitus with hyperglycemia: Secondary | ICD-10-CM

## 2013-04-16 DIAGNOSIS — R55 Syncope and collapse: Secondary | ICD-10-CM

## 2013-04-16 DIAGNOSIS — I129 Hypertensive chronic kidney disease with stage 1 through stage 4 chronic kidney disease, or unspecified chronic kidney disease: Secondary | ICD-10-CM

## 2013-04-16 DIAGNOSIS — I509 Heart failure, unspecified: Secondary | ICD-10-CM

## 2013-04-16 MED ORDER — GABAPENTIN 300 MG PO CAPS: 600.0000 mg | ORAL_CAPSULE | Freq: Three times a day (TID) | ORAL | Status: DC

## 2013-04-16 NOTE — Progress Notes (Signed)
HPI:  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 for about one hour, he went inside the home, did not notice any abnormality, when he was sitting on the sofa, talking with his wife, she noticed that he suddenly fell backwards, eyes open, unresponsive, his face was clammy, lasting 2-5 minutes, breathing fast, she called 911, when the paramedics came, blood pressure was 70/50s, not sure the glucose level, he was taken to the emergency room for an assumed orthostatic syncope, he was getting IV fluid, repeat glucose level was 63 per documentation,   CT head showed atrophy, small vessel disease, no acute lesions, laboratory showed a normal TSH, C. 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    Constitutional:   N/A Cardiovascular:  N/A Ear/Nose/Throat:  N/A Skin: N/A Eyes: N/A Respiratory: N/A Gastroitestinal: N/A    Hematology/Lymphatic:  N/A Endocrine:  N/A Musculoskeletal:N/A Allergy/Immunology: N/A Neurological: N/A Psychiatric:    N/A  PHYSICAL EXAMINATOINS:  Generalized: In no acute distress  Neck: Supple, no carotid bruits   Cardiac: Regular rate rhythm  Pulmonary: Clear to auscultation bilaterally  Musculoskeletal: No deformity  Neurological examination  Mentation: Dependent on his wife to provide information, Mini-Mental Status Examination is 11 out of 30, he is not oriented to time and place, missed 2 out of 3 recalls, has difficulty writing a sentence, could not spell world backwards,  Cranial nerve II-XII: Pupils were equal round reactive to light extraocular movements were full, visual field were full on confrontational test. facial sensation and strength were normal. hearing was intact to finger rubbing bilaterally. Uvula tongue midline.  head turning and shoulder shrug and were normal and symmetric.Tongue protrusion into cheek strength was normal.  Motor: normal tone, bulk and strength.  Sensory: Intact to fine touch, pinprick, preserved vibratory sensation, and proprioception at toes.  Coordination: Normal finger to nose, heel-to-shin bilaterally there was no truncal ataxia  Gait: Rising up from seated position pushing on a chair arm, wide-based, mild unsteady, could not do tiptoe heel, or tandem walking  Romberg signs: Negative  Deep tendon reflexes: Brachioradialis 2/2, biceps 2/2, triceps 2/2, patellar 2/2, Achilles trace, plantar responses were flexor bilaterally.  Assessment and plan: 79 years old right-handed Caucasian male, with recent right upper thoracic shingles, with continued  posthepatic neuralgia involving the right upper thoracic myotomes. He also has significant memory trouble, insulin-dependent diabetes,  1. post herpetic  neuralgia, I will increase his gabapentin to 300 mg 2 times a day, potentially increased the side effect, including worsening dizziness, sleepiness, unsteady gait, 2. insulin-dependent diabetes, his prolonged episode of unresponsiveness, could due to hypoglycemia, I have advised his wife to check on his medications, especially the dosage of hi insulin use 3 return to clinic in one month with Frank Simpson, lab evaluation to rule out treatable cause of memory loss. 4. dementia, central nervous system degenerative, vs. vascular component.

## 2013-04-17 ENCOUNTER — Encounter: Payer: Self-pay | Admitting: Family Medicine

## 2013-04-17 ENCOUNTER — Ambulatory Visit (INDEPENDENT_AMBULATORY_CARE_PROVIDER_SITE_OTHER): Payer: Medicare Other | Admitting: Family Medicine

## 2013-04-17 VITALS — BP 136/74 | HR 63 | Temp 98.1°F | Wt 200.0 lb

## 2013-04-17 DIAGNOSIS — E1142 Type 2 diabetes mellitus with diabetic polyneuropathy: Secondary | ICD-10-CM | POA: Diagnosis not present

## 2013-04-17 DIAGNOSIS — I129 Hypertensive chronic kidney disease with stage 1 through stage 4 chronic kidney disease, or unspecified chronic kidney disease: Secondary | ICD-10-CM

## 2013-04-17 DIAGNOSIS — B0229 Other postherpetic nervous system involvement: Secondary | ICD-10-CM

## 2013-04-17 DIAGNOSIS — E1149 Type 2 diabetes mellitus with other diabetic neurological complication: Secondary | ICD-10-CM

## 2013-04-17 DIAGNOSIS — R319 Hematuria, unspecified: Secondary | ICD-10-CM

## 2013-04-17 DIAGNOSIS — E1122 Type 2 diabetes mellitus with diabetic chronic kidney disease: Secondary | ICD-10-CM

## 2013-04-17 DIAGNOSIS — R55 Syncope and collapse: Secondary | ICD-10-CM

## 2013-04-17 DIAGNOSIS — E1140 Type 2 diabetes mellitus with diabetic neuropathy, unspecified: Secondary | ICD-10-CM

## 2013-04-17 DIAGNOSIS — R413 Other amnesia: Secondary | ICD-10-CM

## 2013-04-17 LAB — POCT URINALYSIS DIPSTICK
Leukocytes, UA: NEGATIVE
Nitrite, UA: NEGATIVE
pH, UA: 6

## 2013-04-17 LAB — MICROALBUMIN / CREATININE URINE RATIO: Microalb Creat Ratio: 13.1 mg/g (ref 0.0–30.0)

## 2013-04-17 LAB — C-REACTIVE PROTEIN: CRP: 4.1 mg/L (ref 0.0–4.9)

## 2013-04-17 LAB — SEDIMENTATION RATE: Sed Rate: 9 mm/hr (ref 0–30)

## 2013-04-17 NOTE — Progress Notes (Signed)
Quick Note:  I have left message for him, will discuss lab result in detail on follow up. ______

## 2013-04-17 NOTE — Assessment & Plan Note (Signed)
Noted over last several months.  Thought related to gabapentin in past - will continue to monitor and await neurology evaluation.

## 2013-04-17 NOTE — Assessment & Plan Note (Signed)
With UTI - recheck today, and no microhematuria noted.

## 2013-04-17 NOTE — Assessment & Plan Note (Signed)
Only on insulin - lantus and humalog. Hypoglycemia possibly related to recent syncopal episode - I have asked him to split lantus dose from 70 in am to 30 u bid (start lower dose to prevent hypoglycemia). check A1c today.  Goal for him <8%. Lab Results  Component Value Date   HGBA1C 9.1* 04/17/2013  SU was stopped in hospital. I've asked him to return in 1 month with log of sugars, I've asked him to check sugars TID for 1 week prior to next visit to be able to titrate insulin better.

## 2013-04-17 NOTE — Assessment & Plan Note (Signed)
Difficult situation as persistent pain. Recently gabapentin increased to 658m tid.  Await response to this increased dose

## 2013-04-17 NOTE — Assessment & Plan Note (Signed)
Chronic, stable. Continue.

## 2013-04-17 NOTE — Progress Notes (Signed)
Subjective:    Patient ID: Frank Simpson, male    DOB: 26-Aug-1931, 79 y.o.   MRN: [id number]  HPI CC: hospital f/u  Records reviewed.  Hospitalized after syncopal episode with short period of unresponsiveness at home, occurred after he mowed lawn outdoors.  Thought vasovagal vs hypoglycemic episode vs ?seizure.  Saw neurology yesterday (Dr. Krista Blue).  Thought syncopal episode possibly due to hypoglycemia.  Also found some memory deficits - pending return visit for dementia workup in 1 month. Episode of delirium/confusion after given oxycodone in hospital. Did have fall last week.  PHN on right chest wall and axilla - gabapentin has been increased to 666m tid by neurology.  Started on higher dose yesterday.  Also tramadol was restarted in hospital - states this doesn't significantly help.  Does not tolerate stronger narcotics like oxycodone (unsure if tried vicodin).  DM - checks sugars once daily.  Fasting today 235, usually low 100s.  Takes lantus 70u in am, humalog on sliding scale (took 15 units on morning of syncope).  Sulfonylurea was discontinued in hospital. Lab Results  Component Value Date   HGBA1C 8.2* 10/15/2012  Has never seen endocrinologist.  Lab Results  Component Value Date   CREATININE 1.53* 04/03/2013    Admit date: 04/02/2013  Discharge date: 04/04/2013  F/U Phone call: 04/08/2013  Recommendations for Outpatient Follow-up:  1. Recommended followup with neurology to discuss possibility of loss of consciousness episodes being seizures Discharge Diagnoses:  Syncope vs seizure  Type 2 diabetes, uncontrolled, with neuropathy - glipizide stopped  HTN (hypertension)  CAD (coronary artery disease)  Benign hypertension with CKD (chronic kidney disease) stage III  Post herpetic neuralgia  Discharge Condition: Good  Diet recommendation: Diabetic   History of present illness:  Frank Simpson a 79y.o. male  With PMH as listed below but pertinent for recent diagnosis of  herpes zoster with post herpetic neuralgia across his chest. States that he was mowing the lawn under the sun without drinking much fluid and when he went inside his house and sat down he passed out. His wife states that he was down for 4-5 minutes and then regained consciousness. There is no reports of seizure like activity or bowel or bladder incontinence. The problem happened insidiously and resolved with rest.  The patient had been complaining of chest pain but this is not new and is mainly on his right chest exactly where his herpetic neuralgia has been.  In the ED reportedly patient had a blood pressure in the field of 77/47 ( after wife administered nitro) although ED reports that he had not been hypotensive while in the ED. Orthostatic vital signs were not drawn prior to rehydration nor during the ED and patient was given only a 500 cc fluid bolus prior to contacting [place] and recommendations.   Hospital Course:  1. Syncope - patient was admitted in the hospital and was placed on telemetry. We did not notice any arrhythmia. His echocardiogram remained essentially unchanged from September 2013. Carotid ultrasound was negative for significant carotid stenosis. We did notice that the patient was having some hypoglycemia and we discontinued his sulfonylurea. It is conceivable that his syncopal event was related to hypoglycemia. I have also recommended to the wife that she keeps a log of the episodes of altered mental status and she writes down which medications the patient received recently prior to the events. I have advised the patient not to drive and to followup with neurologist to  discuss if it's worthwhile to try an empiric anticonvulsant. Please note that the patient is already taking Neurontin for postherpetic neuralgia.  Procedures:  Echocardiogram  Head CT  Carotid Dopplers Consultations:  None  Discharge Instructions  Discharge Orders    Future Appointments   Provider  Department  Dept Phone    04/16/2013 11:00 AM  Marcial Pacas, MD  [facility]  [phone]    04/23/2013 8:40 AM  [facility]  [place] [facility])  [phone]    04/23/2013 9:15 AM  Darlin Coco, MD  [facility])  [phone]    Future Orders  Complete By  Expires     Diet - low sodium heart healthy  As directed      Increase activity slowly  As directed      Scheduling Instructions:     Do not drive         Medication List     STOP taking these medications       glipiZIDE 10 MG 24 hr tablet    Commonly known as: GLUCOTROL XL    glucose blood test strip    Insulin Syringe-Needle U-100 30G X 1/2" 0.3 ML Misc    pregabalin 75 MG capsule    Commonly known as: LYRICA     TAKE these medications       acetaminophen 500 MG tablet    Commonly known as: TYLENOL    Take 1 tablet (500 mg total) by mouth every 6 (six) hours as needed for pain.    amLODipine 5 MG tablet    Commonly known as: NORVASC    TAKE 1 TABLET BY MOUTH EVERY DAY    aspirin 81 MG tablet    Take 1 tablet (81 mg total) by mouth daily.    gabapentin 300 MG capsule    Commonly known as: NEURONTIN    Take 1 capsule (300 mg total) by mouth 3 (three) times daily.    HUMALOG 100 UNIT/ML injection    Generic drug: insulin lispro    SLIDING SCALE: 150-200 =2UNITS, 201-250 =4UNITS, 251-300 =6UNITS, 301-350 =8UNITS, >350 =10UNITS    insulin glargine 100 UNIT/ML injection    Commonly known as: LANTUS    Inject 70 Units into the skin every morning as directed.    isosorbide mononitrate 30 MG 24 hr tablet    Commonly known as: IMDUR    Take 1 tablet (30 mg total) by mouth daily.    nitroGLYCERIN 0.4 MG SL tablet    Commonly known as: NITROSTAT    Place 1 tablet (0.4 mg total) under the tongue every 5 (five) minutes as needed for chest pain. Up to three doses,if pain continues call 911    rosuvastatin 40 MG tablet    Commonly known as: CRESTOR     Take 1 tablet (40 mg total) by mouth daily.    tamsulosin 0.4 MG Caps    Commonly known as: FLOMAX    Take 1 capsule (0.4 mg total) by mouth daily.    Ticagrelor 90 MG Tabs tablet    Commonly known as: BRILINTA    Take 1 tablet (90 mg total) by mouth 2 (two) times daily.    traMADol 50 MG tablet    Commonly known as: ULTRAM    Take 1 tablet (50 mg total) by mouth every 6 (six) hours as needed for pain.       The results of significant diagnostics from this hospitalization (including imaging,  microbiology, ancillary and laboratory) are listed below for reference.   Significant Diagnostic Studies:  Dg Chest 2 View  04/02/2013 *RADIOLOGY REPORT* Clinical Data: Chest pain. Shortness of breath. Syncope. CHEST - 2 VIEW Comparison: 10/15/2012 and 11/30/2004 chest x-ray. Findings: Pulmonary vascular congestion most notable centrally. Heart size within normal limits. Calcified tortuous aorta. No segmental consolidation or pneumothorax. IMPRESSION: Pulmonary vascular congestion most notable centrally. Calcified tortuous aorta. Original Report Authenticated By: Genia Del, M.D.  Ct Head Wo Contrast  04/02/2013 *RADIOLOGY REPORT* Clinical Data: Syncope. Right-sided weakness. Numbness since this morning. Denies headache. History of myocardial infarction. Diabetic hypertensive patient with hyperlipidemia. History melanoma. CT HEAD WITHOUT CONTRAST Technique: Contiguous axial images were obtained from the base of the skull through the vertex without contrast. Comparison: 10/16/2012. Findings: No intracranial hemorrhage. Small vessel disease type changes without CT evidence of large acute infarct. Global atrophy without hydrocephalus. No intracranial mass lesion detected on this unenhanced exam. Vascular calcifications. IMPRESSION: No intracranial hemorrhage or CT evidence of large acute infarct. Small vessel disease type changes. Mild atrophy without hydrocephalus. Original Report Authenticated By: Genia Del,  M.D.   Labs:  Basic Metabolic Panel:  Recent Labs  Lab  04/02/13 1258  04/02/13 1840  04/03/13 0543   NA  141  --  139   K  3.8  --  4.5   CL  108  --  107   CO2  25  --  26   GLUCOSE  85  --  129*   BUN  16  --  16   CREATININE  1.63*  1.48*  1.53*   CALCIUM  8.8  --  8.7    BNP:  BNP (last 3 results)  Recent Labs   10/15/12 0628  04/02/13 1258   PROBNP  778.0*  486.5*     Past Medical History  Diagnosis Date  . Hypertension   . Diabetes mellitus 1994    dm edu 2012  . Hyperlipidemia   . Osteoarthritis   . CRI (chronic renal insufficiency)     baseline Cr seems to be 1.7-1.8  . Urinary incontinence     s/p PTNS didn't help  . Glaucoma(365)     and cataracts  . History of melanoma   . CAD (coronary artery disease)     07/2012 acute STEMI, mid LAD PCI - DES; cath 09/2012 patent LAD stent, non-hemodynamically significant Left Main/LAD disease, EF 55%  . Thrombocytopenia   . BCC (basal cell carcinoma of skin)     L neck (Dr. Sherrye Payor)  . Complication of anesthesia     confused after cath 10/15/2012  . CVA (cerebral infarction) 09/2012    remote anterior limb of left internal capsule  . Post herpetic neuralgia   . Shingles in March 2014    right chest, across the back  . Syncope 04/02/2013  . Blurred vision   . Weakness   . Joint pain   . CAD (coronary artery disease)   . DM (dermatomyositis)      Review of Systems Per HPI    Objective:   Physical Exam  Nursing note and vitals reviewed. Constitutional: He appears well-developed and well-nourished. No distress.  HENT:  Head: Normocephalic and atraumatic.  Nose: Nose normal.  Mouth/Throat: Oropharynx is clear and moist. No oropharyngeal exudate.  Eyes: Conjunctivae and EOM are normal. Pupils are equal, round, and reactive to light. No scleral icterus.  Neck: Normal range of motion. Neck supple. Carotid bruit is not present.  Cardiovascular:  Normal rate, regular rhythm, normal heart sounds and intact  distal pulses.   No murmur heard. Pulmonary/Chest: Effort normal and breath sounds normal. No respiratory distress. He has no wheezes. He has no rales.  Musculoskeletal: He exhibits no edema.  Diabetic foot exam: Normal inspection No skin breakdown No calluses  Normal DP/PT pulses (2+) Normal sensation to light touch and monofilament Nails normal  Lymphadenopathy:    He has no cervical adenopathy.  Skin: Skin is warm and dry. No rash noted.  Psychiatric: He has a normal mood and affect.       Assessment & Plan:

## 2013-04-17 NOTE — Assessment & Plan Note (Signed)
Recent episode that led to hospitalization. Stable echo, normal head CT, no events on tele. s/p eval by neuro - likely attributed to hypoglycemia as pt had taken 15 u humalog that morning (more than he should as max is 10 units). See above for changes to insulin in hopes of decreased lows.

## 2013-04-17 NOTE — Patient Instructions (Addendum)
Continue gabapentin dose for now. Change in insulin: Take lantus 30 units at 7am and 30 units at 7pm. Only use humalog if sugar high (SLIDING SCALE: 150-200 =2UNITS, 201-250 =4UNITS, 251-300 =6UNITS, 301-350 =8UNITS, >350 =10UNITS) Check sugars three times a day - fasting, before lunch and before dinner. Bring me a log of sugars 1 week prior to your next appointment. Return to see me in 2-3 weeks.

## 2013-04-23 ENCOUNTER — Other Ambulatory Visit: Payer: Medicare Other

## 2013-04-23 ENCOUNTER — Encounter: Payer: Self-pay | Admitting: Cardiology

## 2013-04-23 ENCOUNTER — Ambulatory Visit (INDEPENDENT_AMBULATORY_CARE_PROVIDER_SITE_OTHER): Payer: Medicare Other | Admitting: Cardiology

## 2013-04-23 VITALS — BP 130/68 | HR 60 | Ht 70.0 in | Wt 202.8 lb

## 2013-04-23 DIAGNOSIS — I200 Unstable angina: Secondary | ICD-10-CM

## 2013-04-23 DIAGNOSIS — E785 Hyperlipidemia, unspecified: Secondary | ICD-10-CM

## 2013-04-23 DIAGNOSIS — R079 Chest pain, unspecified: Secondary | ICD-10-CM

## 2013-04-23 DIAGNOSIS — B0229 Other postherpetic nervous system involvement: Secondary | ICD-10-CM | POA: Diagnosis not present

## 2013-04-23 DIAGNOSIS — R55 Syncope and collapse: Secondary | ICD-10-CM

## 2013-04-23 LAB — HEPATIC FUNCTION PANEL
ALT: 12 U/L (ref 0–53)
Bilirubin, Direct: 0.1 mg/dL (ref 0.0–0.3)
Total Bilirubin: 0.6 mg/dL (ref 0.3–1.2)

## 2013-04-23 LAB — BASIC METABOLIC PANEL
Chloride: 106 mEq/L (ref 96–112)
Creatinine, Ser: 1.5 mg/dL (ref 0.4–1.5)

## 2013-04-23 LAB — LIPID PANEL
HDL: 37.3 mg/dL — ABNORMAL LOW (ref >39.00)
LDL Cholesterol: 57 mg/dL (ref 0–99)
Total CHOL/HDL Ratio: 3
Triglycerides: 140 mg/dL (ref 0.0–149.0)

## 2013-04-23 NOTE — Assessment & Plan Note (Signed)
The patient is having moderate postherpetic neuralgia of the right chest.  Presently he is on 600 mg gabapentin 3 times a day but is having issues with ataxia and poor balance.  He does not think that having increased gabapentin is healthy chronic chest discomfort.  I have advised him to reduce the gabapentin back down to 300 mg 3 times a day because of the ataxia side effects

## 2013-04-23 NOTE — Patient Instructions (Addendum)
Will obtain labs today and call you with the results (LP.[id number].[email or web address])  DECREASE GABAPENTIN 300 MG TO ONE THREE TIMES A DAY  Your physician recommends that you schedule a follow-up appointment in: 4 months with fasting labs (lp/bmet/hfp)

## 2013-04-23 NOTE — Assessment & Plan Note (Signed)
The patient has not been experiencing any recurrent angina pectoris.

## 2013-04-23 NOTE — Progress Notes (Signed)
[name] Date of Birth:  09-13-1931 [facility] [address] [place], [place]  [address] [phone]         Fax   [phone]  History of Present Illness: [name] is seen back today for a scheduled followup office visit. On 08/06/2012 the patient had a STEMI with DES to the mid LAD. He is on Brilinta and a baby aspirin daily. His other issues include CKD, HTN, DM, HLD, OA, urinary incontinence, glaucoma, melanoma and thrombocytopenia.  Since last visit he has been doing well. No recurrent angina.  The patient has a history of chronic right-sided chest discomfort secondary to postherpetic neuralgia.  He recently saw his neurologist who doubled up on gabapentin.  He has not had any improvement in his chronic low grade right sided anterior chest discomfort but has been having more dizziness and ataxia probably from the higher dose of gabapentin.  He had his episode of shingles about 3 months ago.  Since I last saw the patient he was rehospitalized after having a hypoglycemic episode.   Current Outpatient Prescriptions  Medication Sig Dispense Refill  . acetaminophen (TYLENOL) 500 MG tablet Take 1 tablet (500 mg total) by mouth every 6 (six) hours as needed for pain.      [name] Kitchen amLODipine (NORVASC) 5 MG tablet TAKE 1 TABLET BY MOUTH EVERY DAY  90 tablet  3  . aspirin 81 MG tablet Take 1 tablet (81 mg total) by mouth daily.  30 tablet    . B-D INS SYR ULTRAFINE .3CC/30G 30G X 1/2" 0.3 ML MISC       . gabapentin (NEURONTIN) 300 MG capsule Take 300 mg by mouth 3 (three) times daily.      [name] Kitchen HUMALOG 100 UNIT/ML injection SLIDING SCALE: 150-200 =2UNITS, 201-250 =4UNITS, 251-300 =6UNITS, 301-350 =8UNITS, >350 =10UNITS  30 mL  3  . insulin glargine (LANTUS) 100 UNIT/ML injection Inject 30 Units into the skin 2 (two) times daily.      . isosorbide mononitrate (IMDUR) 30 MG 24 hr tablet Take 1 tablet (30 mg total) by mouth daily.  60 tablet  6  . nitroGLYCERIN (NITROSTAT) 0.4  MG SL tablet Place 1 tablet (0.4 mg total) under the tongue every 5 (five) minutes as needed for chest pain. Up to three doses,if pain continues call 911  25 tablet  3  . ONE TOUCH ULTRA TEST test strip       . rosuvastatin (CRESTOR) 40 MG tablet Take 1 tablet (40 mg total) by mouth daily.  30 tablet  11  . Tamsulosin HCl (FLOMAX) 0.4 MG CAPS Take 1 capsule (0.4 mg total) by mouth daily.  90 capsule  3  . Ticagrelor (BRILINTA) 90 MG TABS tablet Take 1 tablet (90 mg total) by mouth 2 (two) times daily.  60 tablet  6  . traMADol (ULTRAM) 50 MG tablet Take 1 tablet (50 mg total) by mouth every 6 (six) hours as needed for pain.  30 tablet  0   No current facility-administered medications for this visit.    Allergies  Allergen Reactions  . Januvia (Sitagliptin Phosphate) Other (See Comments)    Possibly affected kidneys?  . Metformin And Related Other (See Comments)    Affected kidneys  . Oxycodone     Severe confusion  . Penicillins Other (See Comments)    Reaction long time ago - doesn't remember    Patient Active Problem List   Diagnosis Date Noted  . Memory loss 04/16/2013  .  Syncope 04/02/2013  . Post herpetic neuralgia 02/26/2013  . Unstable angina 10/15/2012  . CAD (coronary artery disease) 10/15/2012  . Acute on chronic systolic CHF (congestive heart failure) 10/15/2012  . DM type 2 causing CKD stage 3 10/15/2012  . Benign hypertension with CKD (chronic kidney disease) stage III 10/15/2012  . Thrombocytopenia   . STEMI (ST elevation myocardial infarction) 08/06/2012  . Chest pain 11/11/2011  . Osteoarthritis   . CRI (chronic renal insufficiency)   . Urinary incontinence   . Type 2 diabetes, uncontrolled, with neuropathy 07/18/2011  . Hypercholesterolemia 07/18/2011    History  Smoking status  . Never Smoker   Smokeless tobacco  . Never Used    History  Alcohol Use No    Family History  Problem Relation Age of Onset  . Stroke Mother     hemorrhage  .  Diabetes Mother   . Cancer Father     lung  . Diabetes Brother     Review of Systems: Constitutional: no fever chills diaphoresis or fatigue or change in weight.  Head and neck: no hearing loss, no epistaxis, no photophobia or visual disturbance. Respiratory: No cough, shortness of breath or wheezing. Cardiovascular: No chest pain peripheral edema, palpitations. Gastrointestinal: No abdominal distention, no abdominal pain, no change in bowel habits hematochezia or melena. Genitourinary: No dysuria, no frequency, no urgency, no nocturia. Musculoskeletal:No arthralgias, no back pain, no gait disturbance or myalgias. Neurological: No dizziness, no headaches, no numbness, no seizures, no syncope, no weakness, no tremors. Hematologic: No lymphadenopathy, no easy bruising. Psychiatric: No confusion, no hallucinations, no sleep disturbance.    Physical Exam: Filed Vitals:   04/23/13 0910  BP: 130/68  Pulse: 60   the general appearance reveals a well-developed well-nourished gentleman in no distress.The head and neck exam reveals pupils equal and reactive.  Extraocular movements are full.  There is no scleral icterus.  The mouth and pharynx are normal.  The neck is supple.  The carotids reveal no bruits.  The jugular venous pressure is normal.  The  thyroid is not enlarged.  There is no lymphadenopathy.  The chest is clear to percussion and auscultation.  There are no rales or rhonchi.  Expansion of the chest is symmetrical.  The precordium is quiet.  The first heart sound is normal.  The second heart sound is physiologically split.  There is no murmur gallop rub or click.  There is no abnormal lift or heave.  The abdomen is soft and nontender.  The bowel sounds are normal.  The liver and spleen are not enlarged.  There are no abdominal masses.  There are no abdominal bruits.  Extremities reveal good pedal pulses.  There is mild pretibial edema.  There is no cyanosis or clubbing.  Strength is normal  and symmetrical in all extremities.  There is no lateralizing weakness.  There are no sensory deficits.  The skin is warm and dry.  There is no rash.   EKG shows normal sinus rhythm and no ischemic changes  Assessment / Plan: Continue same cardiac medications.  Reduce gabapentin back down to 300 mg 3 times a day. The patient's wife has never had shingles and has never had the shingles vaccine.  She is a patient of ours and I gave her a prescription to get the shingles vaccine shot. The patient is to return in 4 months for followup office visit lipid panel hepatic function panel and basal metabolic panel

## 2013-04-23 NOTE — Assessment & Plan Note (Signed)
The patient has had no further episodes of syncope or hypoglycemia

## 2013-04-23 NOTE — Progress Notes (Signed)
Quick Note:  Please report to patient. The recent labs are stable. Continue same medication and careful diet. BS higher so watch carbs and try to lose weight. ______

## 2013-04-24 ENCOUNTER — Telehealth: Payer: Self-pay | Admitting: *Deleted

## 2013-04-24 NOTE — Telephone Encounter (Signed)
Advised patient of lab results

## 2013-04-24 NOTE — Telephone Encounter (Signed)
Message copied by Earvin Hansen on Wed Apr 24, 2013  8:55 AM ------      Message from: Darlin Coco      Created: Tue Apr 23, 2013  9:00 PM       Please report to patient.  The recent labs are stable. Continue same medication and careful diet. BS higher so watch carbs and try to lose weight. ------

## 2013-05-01 ENCOUNTER — Ambulatory Visit (INDEPENDENT_AMBULATORY_CARE_PROVIDER_SITE_OTHER): Payer: Medicare Other | Admitting: Family Medicine

## 2013-05-01 ENCOUNTER — Encounter: Payer: Self-pay | Admitting: Family Medicine

## 2013-05-01 VITALS — BP 144/72 | HR 64 | Temp 98.2°F | Wt 201.0 lb

## 2013-05-01 DIAGNOSIS — E1142 Type 2 diabetes mellitus with diabetic polyneuropathy: Secondary | ICD-10-CM

## 2013-05-01 DIAGNOSIS — B0229 Other postherpetic nervous system involvement: Secondary | ICD-10-CM | POA: Diagnosis not present

## 2013-05-01 DIAGNOSIS — E1149 Type 2 diabetes mellitus with other diabetic neurological complication: Secondary | ICD-10-CM

## 2013-05-01 DIAGNOSIS — E1140 Type 2 diabetes mellitus with diabetic neuropathy, unspecified: Secondary | ICD-10-CM

## 2013-05-01 NOTE — Assessment & Plan Note (Signed)
Uncontrolled as evidenced by log of sugars he brings. Recommended splitting dose again, start at 35 u bid.  Discussed slow titration up of insulin as able. Continue humalog SSI as currently written. SU was stopped in hospital. rtc 1 mo for recheck sugars. If unable to achieve better control, consider endo referral.

## 2013-05-01 NOTE — Patient Instructions (Addendum)
Set up eye doctor appointment at your convenience. Let's split lantus dose to 35 units twice daily.  Increase by 1 unit twice daily every 3 days if sugars persistently high >150 (to a max of 45 units twice daily). Return in 1 month for follow up. Bring me log like today for next visit.   Call us with questions in the meantime

## 2013-05-01 NOTE — Assessment & Plan Note (Signed)
Persistent pain - gabapentin has been decreased to 361m tid, with resolution of dizziness, imbalance. Some disability from this pain. Unable to tolerate narcotics.

## 2013-05-01 NOTE — Progress Notes (Signed)
  Subjective:    Patient ID: Frank Simpson, male    DOB: [birth year], 79 y.o.   MRN: [id number]  HPI CC: 2-3 wk f/u DM  Brings 2 wk log of sugars showing significantly elevated sugars.  Ranging 120-300 fasting, 172-400 prior to lunch, 300-400 prior to dinner.  Last visit we discussed splitting lantus from 70u in am to 30 u bid - they stopped this after 2-3 days 2/2 concern for elevated sugars. No low sugars, no paresthesias.  Foot exam done last visit. Eye exam not set up yet.   Not regular with meals.  Eating at same time   24 hour recall:  Breakfast - sausage and 2 pieces of toast with jelly.  Coffee. Snack - banana. Lunch - cabbage and chicken breast, diet mountain dew. Snack - none Dinner - 2 slices of 3 meat pizza, diet mountain dew.  PHN - gabapentin decreased to 350m tid 2/2 persistent falls, dizziness, blurred vision.  Unchanged pain.  Past Medical History  Diagnosis Date  . Hypertension   . Diabetes mellitus 1994    dm edu 2012  . Hyperlipidemia   . Osteoarthritis   . CRI (chronic renal insufficiency)     baseline Cr seems to be 1.7-1.8  . Urinary incontinence     s/p PTNS didn't help  . Glaucoma     and cataracts  . History of melanoma   . CAD (coronary artery disease)     07/2012 acute STEMI, mid LAD PCI - DES; cath 09/2012 patent LAD stent, non-hemodynamically significant Left Main/LAD disease, EF 55%  . Thrombocytopenia   . BCC (basal cell carcinoma of skin)     L neck (Dr. HSherrye Payor  . Complication of anesthesia     confused after cath 10/15/2012  . CVA (cerebral infarction) 09/2012    remote anterior limb of left internal capsule  . Post herpetic neuralgia   . Shingles in March 2014    right chest, across the back  . Syncope 04/02/2013  . Blurred vision     Review of Systems Per HPI    Objective:   Physical Exam  Nursing note and vitals reviewed. Constitutional: He appears well-developed and well-nourished. No distress.  HENT:  Head: Normocephalic  and atraumatic.  Mouth/Throat: Oropharynx is clear and moist. No oropharyngeal exudate.  Eyes: Conjunctivae and EOM are normal. Pupils are equal, round, and reactive to light. No scleral icterus.  Cardiovascular: Normal rate, regular rhythm, normal heart sounds and intact distal pulses.   No murmur heard. Pulmonary/Chest: Effort normal and breath sounds normal. No respiratory distress. He has no wheezes. He has no rales.  Musculoskeletal: He exhibits no edema.  Lymphadenopathy:    He has no cervical adenopathy.       Assessment & Plan:

## 2013-05-03 DIAGNOSIS — B0229 Other postherpetic nervous system involvement: Secondary | ICD-10-CM

## 2013-05-03 DIAGNOSIS — R319 Hematuria, unspecified: Secondary | ICD-10-CM | POA: Diagnosis not present

## 2013-05-03 DIAGNOSIS — I129 Hypertensive chronic kidney disease with stage 1 through stage 4 chronic kidney disease, or unspecified chronic kidney disease: Secondary | ICD-10-CM

## 2013-05-03 DIAGNOSIS — E1129 Type 2 diabetes mellitus with other diabetic kidney complication: Secondary | ICD-10-CM | POA: Diagnosis not present

## 2013-05-03 DIAGNOSIS — R55 Syncope and collapse: Secondary | ICD-10-CM | POA: Diagnosis not present

## 2013-05-03 DIAGNOSIS — R413 Other amnesia: Secondary | ICD-10-CM

## 2013-05-03 DIAGNOSIS — N183 Chronic kidney disease, stage 3 unspecified: Secondary | ICD-10-CM

## 2013-05-03 DIAGNOSIS — E1149 Type 2 diabetes mellitus with other diabetic neurological complication: Secondary | ICD-10-CM | POA: Diagnosis not present

## 2013-05-21 ENCOUNTER — Ambulatory Visit: Payer: Medicare Other | Admitting: Neurology

## 2013-06-03 ENCOUNTER — Encounter: Payer: Self-pay | Admitting: Family Medicine

## 2013-06-03 ENCOUNTER — Ambulatory Visit (INDEPENDENT_AMBULATORY_CARE_PROVIDER_SITE_OTHER): Payer: Medicare Other | Admitting: Family Medicine

## 2013-06-03 VITALS — BP 112/68 | HR 76 | Temp 98.1°F | Wt 198.5 lb

## 2013-06-03 DIAGNOSIS — E1165 Type 2 diabetes mellitus with hyperglycemia: Secondary | ICD-10-CM

## 2013-06-03 DIAGNOSIS — E1142 Type 2 diabetes mellitus with diabetic polyneuropathy: Secondary | ICD-10-CM | POA: Diagnosis not present

## 2013-06-03 DIAGNOSIS — E1149 Type 2 diabetes mellitus with other diabetic neurological complication: Secondary | ICD-10-CM | POA: Diagnosis not present

## 2013-06-03 DIAGNOSIS — B0229 Other postherpetic nervous system involvement: Secondary | ICD-10-CM

## 2013-06-03 MED ORDER — HYDROCODONE-ACETAMINOPHEN 5-325 MG PO TABS: 0.5000 | ORAL_TABLET | Freq: Every evening | ORAL | Status: DC | PRN

## 2013-06-03 NOTE — Patient Instructions (Addendum)
Go up on lantus to 45 units in the morning and 40 units at night. Add 4 units to lunch and dinner (mealtime coverage insulin) PLUS sliding scale. I'd like you to keep sugar log for another week - continue checking sugars fasting, pre lunch and pre dinner, and 2 hours after lunch and dinner for 1 week, and drop off sugars for me to review in 1 week. Call me sooner if any low sugars. Good to see you today, call us with quesitons.

## 2013-06-03 NOTE — Assessment & Plan Note (Signed)
Chronically uncontrolled. Will increase AM lantus to 45 units given severely elevated sugars at night (continue PM lantus at 40 units), and will also add mealtime coverage to lunch and dinner in form of 4 units of insulin.  Continue SSI. Bring me log of sugars checked 5x/day in 1 wk, update [place] if hypoglycemia develops. Reviewed hypoglycemia plan. rtc 1 mo for f/u

## 2013-06-03 NOTE — Progress Notes (Signed)
  Subjective:    Patient ID: Frank Simpson, male    DOB: 03-07-1931, 79 y.o.   MRN: [id number]  HPI CC: f/u DM  See prior note for details. I brought him back for 1 month follow up of sugars.   Wife has returned home to pick up log (forgot).   Pt states sugars running high. Pt checks sugars, wife writes them down.  Checks TID (fasting, before lunch and at bedtime). Denies paresthesias.  No low sugars.  No hypoglycemic sxs.  Reviewed low sugar plan. Lab Results  Component Value Date   HGBA1C 9.1* 04/17/2013    Taking lantus twice daily but doesn't know dose. Injecting into arms.  Wife brings log showing very high sugars: fasting 81-200s, pre lunch 150-300s, after dinner 200-400s. Lab Results  Component Value Date   CREATININE 1.5 04/23/2013     PHN - continued pain.  Tramadol doesn't help, on tylenol and gabapentin as well.  Wife requests refill of hydrocodone as 1/2 pill at night time is tolerated well.  Will refill.  Past Medical History  Diagnosis Date  . Hypertension   . Diabetes mellitus 1994    dm edu 2012  . Hyperlipidemia   . Osteoarthritis   . CRI (chronic renal insufficiency)     baseline Cr seems to be 1.7-1.8  . Urinary incontinence     s/p PTNS didn't help  . Glaucoma     and cataracts  . History of melanoma   . CAD (coronary artery disease)     07/2012 acute STEMI, mid LAD PCI - DES; cath 09/2012 patent LAD stent, non-hemodynamically significant Left Main/LAD disease, EF 55%  . Thrombocytopenia   . BCC (basal cell carcinoma of skin)     L neck (Dr. Sherrye Payor)  . Complication of anesthesia     confused after cath 10/15/2012  . CVA (cerebral infarction) 09/2012    remote anterior limb of left internal capsule  . Post herpetic neuralgia   . Shingles in March 2014    right chest, across the back  . Syncope 04/02/2013  . Blurred vision      Review of Systems Per HPI    Objective:   Physical Exam  Nursing note and vitals reviewed. Constitutional: He  appears well-developed and well-nourished. No distress.  HENT:  Head: Normocephalic and atraumatic.  Mouth/Throat: Oropharynx is clear and moist. No oropharyngeal exudate.  Eyes: Conjunctivae and EOM are normal. Pupils are equal, round, and reactive to light. No scleral icterus.  Cardiovascular: Normal rate, regular rhythm, normal heart sounds and intact distal pulses.   No murmur heard. Pulmonary/Chest: Effort normal and breath sounds normal. No respiratory distress. He has no wheezes. He has no rales.  Musculoskeletal: He exhibits no edema.  Skin: Skin is warm and dry. No rash noted.       Assessment & Plan:

## 2013-06-03 NOTE — Assessment & Plan Note (Signed)
Added on hydrocodone 1/2 tab PRN per wife request.

## 2013-06-06 ENCOUNTER — Other Ambulatory Visit: Payer: Self-pay

## 2013-06-11 ENCOUNTER — Telehealth: Payer: Self-pay

## 2013-06-11 NOTE — Telephone Encounter (Signed)
Pt dropped off blood sugar log for Dr Gutierrez's review; Log in Dr Synthia Innocent in box.

## 2013-06-12 NOTE — Telephone Encounter (Signed)
Reviewed 1 wk log he brings me -  fasting 130-269 Daytime sugars 250-400s Dinner time 220-397  I recommend -  Increase AM lantus to 50 units, continue 40 units lantus at dinner time. Increase mealtime correction humalog to 8 units of humalog, plus sliding scale. I've printed out log I want him to pick up to keep track of sugars for next week and drop off again next week.  If we don't see improvement in sugars, may refer to endocrinologist for further management.

## 2013-06-12 NOTE — Telephone Encounter (Signed)
Message left for patient to return my call.

## 2013-06-17 ENCOUNTER — Other Ambulatory Visit (HOSPITAL_COMMUNITY): Payer: Self-pay | Admitting: Cardiology

## 2013-06-17 NOTE — Telephone Encounter (Signed)
Patient notified of medication changes. Sugar log placed up front for pick up.

## 2013-06-18 ENCOUNTER — Other Ambulatory Visit: Payer: Self-pay

## 2013-07-01 ENCOUNTER — Telehealth: Payer: Self-pay | Admitting: Family Medicine

## 2013-07-01 NOTE — Telephone Encounter (Signed)
Patient and wife notified. Sugar log mailed to patient as requested. They will call if any questions or low sugars noted.

## 2013-07-01 NOTE — Telephone Encounter (Signed)
Received log of sugars he's kept - overall improving.  Asked to scan. Am 77-140s prelunch 103-227 Postprandial lunch 192-400 predinner 120-311 postprandial dinner 206-287  Please call patient -  Continue lantus as is - morning lantus 50 units, night time lantus 40 units Increase mealtime insulin to 10 units with lunch and dinner plus sliding scale. To notify me with questions, sooner if low sugars or other concerns. prnted out new log for him to continue monitoring.

## 2013-07-04 ENCOUNTER — Ambulatory Visit: Payer: Medicare Other | Admitting: Family Medicine

## 2013-07-09 ENCOUNTER — Encounter: Payer: Self-pay | Admitting: Radiology

## 2013-07-10 ENCOUNTER — Ambulatory Visit (INDEPENDENT_AMBULATORY_CARE_PROVIDER_SITE_OTHER): Payer: Medicare Other | Admitting: Family Medicine

## 2013-07-10 ENCOUNTER — Ambulatory Visit (INDEPENDENT_AMBULATORY_CARE_PROVIDER_SITE_OTHER)
Admission: RE | Admit: 2013-07-10 | Discharge: 2013-07-10 | Disposition: A | Discharge status: Home / Self Care | Payer: Medicare Other | Source: Ambulatory Visit | Attending: Family Medicine | Admitting: Family Medicine

## 2013-07-10 ENCOUNTER — Encounter: Payer: Self-pay | Admitting: Family Medicine

## 2013-07-10 VITALS — BP 130/70 | HR 64 | Temp 98.3°F | Wt 204.0 lb

## 2013-07-10 DIAGNOSIS — E1149 Type 2 diabetes mellitus with other diabetic neurological complication: Secondary | ICD-10-CM

## 2013-07-10 DIAGNOSIS — E1142 Type 2 diabetes mellitus with diabetic polyneuropathy: Secondary | ICD-10-CM | POA: Diagnosis not present

## 2013-07-10 DIAGNOSIS — S4980XA Other specified injuries of shoulder and upper arm, unspecified arm, initial encounter: Secondary | ICD-10-CM

## 2013-07-10 DIAGNOSIS — E1165 Type 2 diabetes mellitus with hyperglycemia: Secondary | ICD-10-CM

## 2013-07-10 DIAGNOSIS — M19019 Primary osteoarthritis, unspecified shoulder: Secondary | ICD-10-CM | POA: Diagnosis not present

## 2013-07-10 DIAGNOSIS — M25569 Pain in unspecified knee: Secondary | ICD-10-CM | POA: Diagnosis not present

## 2013-07-10 DIAGNOSIS — S4992XA Unspecified injury of left shoulder and upper arm, initial encounter: Secondary | ICD-10-CM

## 2013-07-10 DIAGNOSIS — S4990XA Unspecified injury of shoulder and upper arm, unspecified arm, initial encounter: Secondary | ICD-10-CM | POA: Insufficient documentation

## 2013-07-10 DIAGNOSIS — E1140 Type 2 diabetes mellitus with diabetic neuropathy, unspecified: Secondary | ICD-10-CM

## 2013-07-10 DIAGNOSIS — M25561 Pain in right knee: Secondary | ICD-10-CM | POA: Insufficient documentation

## 2013-07-10 MED ORDER — INSULIN GLARGINE 100 UNIT/ML SOLOSTAR PEN: PEN_INJECTOR | SUBCUTANEOUS | Status: DC

## 2013-07-10 MED ORDER — HYDROCODONE-ACETAMINOPHEN 5-325 MG PO TABS: 0.5000 | ORAL_TABLET | Freq: Every evening | ORAL | Status: DC | PRN

## 2013-07-10 NOTE — Patient Instructions (Addendum)
For knee - continue to elevate as able, may use tylenol and hydrocodone for pain.  If not improving with time, I want you to see your orthopedist. For left shoulder - I'm worried about shoulder separation.  We will check xray of L shoulder today.  Use sling while we get xray results back. May use hydrocodone for pain. For diabetes - no changes today.  Come back in 3 months for diabetes follow up, prior fasting for blood work Good to see you today, call us with questions.

## 2013-07-10 NOTE — Assessment & Plan Note (Addendum)
Reviewed log of sugars - fasting sugars at goal.  However mealtime sugars and postprandial remaining elevated. Consider increase in sliding scale correction scale. No changes made today. RTC 6mofor f/u diabetes. No hypoglycemic episodes.

## 2013-07-10 NOTE — Assessment & Plan Note (Signed)
Concern for shoulder separation - check xrays today  Placed in sling to immobilize shoulder while we obtain xrays - if shoulder separation, will refer to sports medicine for further management. In interim, treat with ice and increased hydrocodone. Hydrocodone refilled today.

## 2013-07-10 NOTE — Assessment & Plan Note (Signed)
anticipate acute R knee strain after fall. Recommended supportive care.  If persistent pain - recommended f/u with ortho given h/o R TKR. No evidence of compartment syndrome.

## 2013-07-10 NOTE — Progress Notes (Signed)
  Subjective:    Patient ID: Frank Simpson, male    DOB: [birth year], 79 y.o.   MRN: [id number]  HPI CC: DM f/u and discuss recent fall  DOI: 07/03/2013 [place] down while cutting cobwebs from pecan tree.  When looking up - got lightheaded.  Cut R eyebrow, hit L shoulder and R knee and lower leg.  checked sugar - 260 so not due to hypoglycemia.  Diabetes -  Endorses has received pneumovax.  From recent phone note: Received log of sugars he's kept - overall improving. Asked to scan.  Am 77-140s  prelunch 103-227  Postprandial lunch 192-400  predinner 120-311  postprandial dinner 206-287  Decided to continue lantus as is - morning lantus 50 units, night time lantus 40 units  Increased mealtime insulin to 10 units with lunch and dinner plus sliding scale.   Lab Results  Component Value Date   HGBA1C 9.1* 04/17/2013   Brings new log today Fasting 73-131 prelunch 71-236 postlunch 196-320 predinner 148-350 postdinner 200s  In donut hole - lantus will cost $700. Requests samples today.  Past Medical History  Diagnosis Date  . Hypertension   . Diabetes mellitus 1994    dm edu 2012  . Hyperlipidemia   . Osteoarthritis   . CRI (chronic renal insufficiency)     baseline Cr seems to be 1.7-1.8  . Urinary incontinence     s/p PTNS didn't help  . Glaucoma     and cataracts  . History of melanoma   . CAD (coronary artery disease)     07/2012 acute STEMI, mid LAD PCI - DES; cath 09/2012 patent LAD stent, non-hemodynamically significant Left Main/LAD disease, EF 55%  . Thrombocytopenia   . BCC (basal cell carcinoma of skin)     L neck (Dr. Sherrye Payor)  . Complication of anesthesia     confused after cath 10/15/2012  . CVA (cerebral infarction) 09/2012    remote anterior limb of left internal capsule  . Post herpetic neuralgia   . Shingles in March 2014    right chest, across the back  . Syncope 04/02/2013  . Blurred vision      Review of Systems Per HPI    Objective:   Physical Exam  Nursing note and vitals reviewed. Constitutional: He appears well-developed and well-nourished. No distress.  HENT:  Mouth/Throat: Oropharynx is clear and moist. No oropharyngeal exudate.  Cardiovascular: Normal rate, regular rhythm, normal heart sounds and intact distal pulses.   No murmur heard. Pulmonary/Chest: Effort normal and breath sounds normal. No respiratory distress. He has no wheezes. He has no rales.  Musculoskeletal: He exhibits edema.  Tender to palpation at L [facility] joint. Tender at L [facility] joint with crossover test Overall intact range of motion bilateral shoulders. No pain at clavicle, no stepoff  R knee with FROM flexion/extension Incision present - s/p TKR No pain with palpation or effusion/swelling.  RLE with 1+ pitting edema and bruising LLE with tr pitting edema  Skin: Skin is warm and dry. Bruising and ecchymosis noted.  Ecchymosis at left [facility] joint, diffuse bruising inferior to this along left upper chest wall Ecchymosis and bruising of right lower leg       Assessment & Plan:

## 2013-07-11 ENCOUNTER — Other Ambulatory Visit: Payer: Self-pay

## 2013-07-11 MED ORDER — INSULIN PEN NEEDLE 31G X 8 MM MISC: Status: DC

## 2013-07-11 NOTE — Telephone Encounter (Signed)
Mrs Carrero left v/m requesting pen needles for Lantus solostar; left v/m advised done.

## 2013-08-08 ENCOUNTER — Telehealth: Payer: Self-pay | Admitting: Family Medicine

## 2013-08-08 NOTE — Telephone Encounter (Signed)
Received log of sugars, asked to scan.  Checks QID. Fasting 50-100 Pre lunch 150-250 Post lunch 200-350 Dinner 200-350  plz call patient - I'd like to have him decrease PM lantus to 35 unilts, continue am lantus at 50 units. Also, change mealtime insulin to 15 units with lunch and dinner. Notify me if low sugars with this regimen. Lab Results  Component Value Date   HGBA1C 9.1* 04/17/2013

## 2013-08-08 NOTE — Telephone Encounter (Signed)
Message left advising patient. Instructed to call to verify with me that he understood new directions.

## 2013-08-12 NOTE — Telephone Encounter (Signed)
Frank Simpson called back and they do understand new directions for taking Lantus and mealtime insulin. Frank Graffam said since change of instructions FBS 88.

## 2013-08-21 ENCOUNTER — Encounter: Payer: Self-pay | Admitting: Cardiology

## 2013-08-21 ENCOUNTER — Other Ambulatory Visit: Payer: Medicare Other

## 2013-08-21 ENCOUNTER — Ambulatory Visit (INDEPENDENT_AMBULATORY_CARE_PROVIDER_SITE_OTHER): Payer: Medicare Other | Admitting: Cardiology

## 2013-08-21 VITALS — BP 136/84 | HR 63 | Wt 204.0 lb

## 2013-08-21 DIAGNOSIS — I5023 Acute on chronic systolic (congestive) heart failure: Secondary | ICD-10-CM | POA: Diagnosis not present

## 2013-08-21 DIAGNOSIS — B0229 Other postherpetic nervous system involvement: Secondary | ICD-10-CM

## 2013-08-21 DIAGNOSIS — I2510 Atherosclerotic heart disease of native coronary artery without angina pectoris: Secondary | ICD-10-CM | POA: Diagnosis not present

## 2013-08-21 DIAGNOSIS — I509 Heart failure, unspecified: Secondary | ICD-10-CM

## 2013-08-21 DIAGNOSIS — E785 Hyperlipidemia, unspecified: Secondary | ICD-10-CM | POA: Diagnosis not present

## 2013-08-21 LAB — BASIC METABOLIC PANEL
BUN: 18 mg/dL (ref 6–23)
CO2: 30 mEq/L (ref 19–32)
Chloride: 105 mEq/L (ref 96–112)
Glucose, Bld: 161 mg/dL — ABNORMAL HIGH (ref 70–99)
Potassium: 4.4 mEq/L (ref 3.5–5.1)
Sodium: 139 mEq/L (ref 135–145)

## 2013-08-21 LAB — LIPID PANEL
LDL Cholesterol: 50 mg/dL (ref 0–99)
Total CHOL/HDL Ratio: 3
VLDL: 18.2 mg/dL (ref 0.0–40.0)

## 2013-08-21 LAB — HEPATIC FUNCTION PANEL
ALT: 13 U/L (ref 0–53)
Bilirubin, Direct: 0.1 mg/dL (ref 0.0–0.3)
Total Bilirubin: 0.9 mg/dL (ref 0.3–1.2)

## 2013-08-21 NOTE — Assessment & Plan Note (Signed)
The patient has constant right-sided chest discomfort secondary to his postherpetic neuralgia.  The pain is no worse since we cut back on his gabapentin.  He does not think that the ataxia and dizziness has gotten any better since we cut back on his gabapentin either

## 2013-08-21 NOTE — Assessment & Plan Note (Signed)
Patient has had no recurrent angina pectoris.  Has not had to take any sublingual nitroglycerin.

## 2013-08-21 NOTE — Patient Instructions (Signed)
Will obtain labs today and call you with the results (lp/bmet/hfp)  Your physician recommends that you continue on your current medications as directed. Please refer to the Current Medication list given to you today.  Your physician wants you to follow-up in: 4 months with fasting labs (lp/bmet/hfp) and ekg You will receive a reminder letter in the mail two months in advance. If you don't receive a letter, please call our office to schedule the follow-up appointment.

## 2013-08-21 NOTE — Progress Notes (Signed)
[name] Date of Birth:  [birth year] [facility] [address] [place], [place]  [address] [phone]         Fax   [phone]  History of Present Illness: Frank Simpson is seen back today for a scheduled followup office visit. On 08/06/2012 the patient had a STEMI with DES to the mid LAD. He is on Brilinta and a baby aspirin daily. His other issues include CKD, HTN, DM, HLD, OA, urinary incontinence, glaucoma, melanoma and thrombocytopenia.  Since last visit he has been doing well. No recurrent angina.  The patient has a history of chronic right-sided chest discomfort secondary to postherpetic neuralgia.   He had his episode of shingles about 8 months ago.  Since last visit he has not been experiencing any definite recurrent angina pectoris.   Current Outpatient Prescriptions  Medication Sig Dispense Refill  . acetaminophen (TYLENOL) 500 MG tablet Take 1 tablet (500 mg total) by mouth every 6 (six) hours as needed for pain.      [name] Kitchen amLODipine (NORVASC) 5 MG tablet TAKE 1 TABLET BY MOUTH EVERY DAY  90 tablet  3  . aspirin 81 MG tablet Take 1 tablet (81 mg total) by mouth daily.  30 tablet    . BRILINTA 90 MG TABS tablet TAKE 1 TABLET (90 MG TOTAL) BY MOUTH 2 (TWO) TIMES DAILY.  60 tablet  6  . gabapentin (NEURONTIN) 300 MG capsule Take 300 mg by mouth 3 (three) times daily.      [name] Kitchen HYDROcodone-acetaminophen (NORCO/VICODIN) 5-325 MG per tablet Take 0.5 tablets by mouth at bedtime as needed for pain.  30 tablet  0  . insulin glargine (LANTUS) 100 UNIT/ML injection Inject into the skin. 50 units in am and 35 units at night.  QS 1 month  10 mL    . insulin lispro (HUMALOG) 100 UNIT/ML injection Inject 15 units subcutaneously with lunch and dinner, plus sliding scale correction  10 mL    . Insulin Pen Needle 31G X 8 MM MISC Use as directed with Lantus solostar. Dx 250.62.  100 each  5  . isosorbide mononitrate (IMDUR) 30 MG 24 hr tablet Take 1 tablet (30 mg total) by  mouth daily.  60 tablet  6  . ONE TOUCH ULTRA TEST test strip       . Tamsulosin HCl (FLOMAX) 0.4 MG CAPS Take 1 capsule (0.4 mg total) by mouth daily.  90 capsule  3  . nitroGLYCERIN (NITROSTAT) 0.4 MG SL tablet Place 1 tablet (0.4 mg total) under the tongue every 5 (five) minutes as needed for chest pain. Up to three doses,if pain continues call 911  25 tablet  3  . rosuvastatin (CRESTOR) 40 MG tablet Take 1 tablet (40 mg total) by mouth daily.  30 tablet  11   No current facility-administered medications for this visit.    Allergies  Allergen Reactions  . Januvia [Sitagliptin Phosphate] Other (See Comments)    Possibly affected kidneys?  . Metformin And Related Other (See Comments)    Affected kidneys  . Oxycodone     Severe confusion  . Penicillins Other (See Comments)    Reaction long time ago - doesn't remember    Patient Active Problem List   Diagnosis Date Noted  . Shoulder injury 07/10/2013  . Right knee pain 07/10/2013  . Memory loss 04/16/2013  . Syncope 04/02/2013  . Post herpetic neuralgia 02/26/2013  . Unstable angina 10/15/2012  . CAD (coronary artery disease) 10/15/2012  .  Acute on chronic systolic CHF (congestive heart failure) 10/15/2012  . DM type 2 causing CKD stage 3 10/15/2012  . Benign hypertension with CKD (chronic kidney disease) stage III 10/15/2012  . Thrombocytopenia   . STEMI (ST elevation myocardial infarction) 08/06/2012  . Chest pain 11/11/2011  . Osteoarthritis   . CRI (chronic renal insufficiency)   . Urinary incontinence   . Type 2 diabetes, uncontrolled, with neuropathy 07/18/2011  . Hypercholesterolemia 07/18/2011    History  Smoking status  . Never Smoker   Smokeless tobacco  . Never Used    History  Alcohol Use No    Family History  Problem Relation Age of Onset  . Stroke Mother     hemorrhage  . Diabetes Mother   . Cancer Father     lung  . Diabetes Brother     Review of Systems: Constitutional: no fever chills  diaphoresis or fatigue or change in weight.  Head and neck: no hearing loss, no epistaxis, no photophobia or visual disturbance. Respiratory: No cough, shortness of breath or wheezing. Cardiovascular: No chest pain peripheral edema, palpitations. Gastrointestinal: No abdominal distention, no abdominal pain, no change in bowel habits hematochezia or melena. Genitourinary: No dysuria, no frequency, no urgency, no nocturia. Musculoskeletal:No arthralgias, no back pain, no gait disturbance or myalgias. Neurological: No dizziness, no headaches, no numbness, no seizures, no syncope, no weakness, no tremors. Hematologic: No lymphadenopathy, no easy bruising. Psychiatric: No confusion, no hallucinations, no sleep disturbance.    Physical Exam: Filed Vitals:   08/21/13 0831  BP: 136/84  Pulse: 63   the general appearance reveals a well-developed well-nourished gentleman in no distress.The head and neck exam reveals pupils equal and reactive.  Extraocular movements are full.  There is no scleral icterus.  The mouth and pharynx are normal.  The neck is supple.  The carotids reveal no bruits.  The jugular venous pressure is normal.  The  thyroid is not enlarged.  There is no lymphadenopathy.  The chest is clear to percussion and auscultation.  There are no rales or rhonchi.  Expansion of the chest is symmetrical.  The precordium is quiet.  The first heart sound is normal.  The second heart sound is physiologically split.  There is no murmur gallop rub or click.  There is no abnormal lift or heave.  The abdomen is soft and nontender.  The bowel sounds are normal.  The liver and spleen are not enlarged.  There are no abdominal masses.  There are no abdominal bruits.  Extremities reveal good pedal pulses.  There is mild pretibial edema.  There is no cyanosis or clubbing.  Strength is normal and symmetrical in all extremities.  There is no lateralizing weakness.  There are no sensory deficits.  The skin is warm  and dry.  There is no rash.     Assessment / Plan: Continue same cardiac medications.  Continue present dose of gabapentin 300 mg 3 times a day.  Weight is up 2 pounds and he will try harder on weight loss.  He is not having any hypoglycemic episodes.  The patient is to return in 4 months for followup office visit lipid panel hepatic function panel and basal metabolic panel

## 2013-08-21 NOTE — Assessment & Plan Note (Signed)
Patient has not had any symptoms of CHF.  He sleeps on one pillow.  Is not having any increased exertional dyspnea.

## 2013-08-21 NOTE — Progress Notes (Signed)
Quick Note:  Please report to patient. The recent labs are stable. Continue same medication and careful diet. BS 161 ______

## 2013-08-22 ENCOUNTER — Telehealth: Payer: Self-pay | Admitting: *Deleted

## 2013-08-22 NOTE — Telephone Encounter (Signed)
Message copied by Earvin Hansen on Thu Aug 22, 2013  9:32 AM ------      Message from: Darlin Coco      Created: Wed Aug 21, 2013  8:15 PM       Please report to patient.  The recent labs are stable. Continue same medication and careful diet. BS 161 ------

## 2013-08-22 NOTE — Telephone Encounter (Signed)
Advised patient of lab results

## 2013-08-28 DIAGNOSIS — Z23 Encounter for immunization: Secondary | ICD-10-CM | POA: Diagnosis not present

## 2013-10-09 ENCOUNTER — Other Ambulatory Visit: Payer: Self-pay | Admitting: Family Medicine

## 2013-10-09 ENCOUNTER — Ambulatory Visit (INDEPENDENT_AMBULATORY_CARE_PROVIDER_SITE_OTHER): Payer: Medicare Other | Admitting: Family Medicine

## 2013-10-09 ENCOUNTER — Encounter: Payer: Self-pay | Admitting: Family Medicine

## 2013-10-09 VITALS — BP 152/82 | HR 68 | Temp 98.1°F | Wt 194.5 lb

## 2013-10-09 DIAGNOSIS — R55 Syncope and collapse: Secondary | ICD-10-CM | POA: Diagnosis not present

## 2013-10-09 DIAGNOSIS — I129 Hypertensive chronic kidney disease with stage 1 through stage 4 chronic kidney disease, or unspecified chronic kidney disease: Secondary | ICD-10-CM

## 2013-10-09 DIAGNOSIS — E1142 Type 2 diabetes mellitus with diabetic polyneuropathy: Secondary | ICD-10-CM

## 2013-10-09 DIAGNOSIS — N189 Chronic kidney disease, unspecified: Secondary | ICD-10-CM

## 2013-10-09 DIAGNOSIS — IMO0002 Reserved for concepts with insufficient information to code with codable children: Secondary | ICD-10-CM

## 2013-10-09 DIAGNOSIS — E1140 Type 2 diabetes mellitus with diabetic neuropathy, unspecified: Secondary | ICD-10-CM

## 2013-10-09 DIAGNOSIS — E1129 Type 2 diabetes mellitus with other diabetic kidney complication: Secondary | ICD-10-CM | POA: Diagnosis not present

## 2013-10-09 DIAGNOSIS — N183 Type 2 diabetes mellitus with diabetic chronic kidney disease: Secondary | ICD-10-CM

## 2013-10-09 DIAGNOSIS — E1149 Type 2 diabetes mellitus with other diabetic neurological complication: Secondary | ICD-10-CM

## 2013-10-09 DIAGNOSIS — E1122 Type 2 diabetes mellitus with diabetic chronic kidney disease: Secondary | ICD-10-CM

## 2013-10-09 LAB — BASIC METABOLIC PANEL
BUN: 23 mg/dL (ref 6–23)
CO2: 26 mEq/L (ref 19–32)
Chloride: 102 mEq/L (ref 96–112)
Creatinine, Ser: 1.7 mg/dL — ABNORMAL HIGH (ref 0.4–1.5)
Glucose, Bld: 330 mg/dL — ABNORMAL HIGH (ref 70–99)
Potassium: 4.3 mEq/L (ref 3.5–5.1)

## 2013-10-09 MED ORDER — GLUCOSE BLOOD VI STRP: ORAL_STRIP | Status: DC

## 2013-10-09 NOTE — Assessment & Plan Note (Signed)
Check Cr today.

## 2013-10-09 NOTE — Progress Notes (Signed)
  Subjective:    Patient ID: Frank Simpson, male    DOB: 10/14/1931, 79 y.o.   MRN: [id number]  HPI CC: 3moDM f/u  DM - checking sugars 3x/day.  Highest sugar 400.  Prior to this episode had not had any low sugars.  Taking lantus 50u in am and 35u in pm.  Taking 15 units of humalog with lunch and dinner, plus sliding scale.    Passed out yesterday - after working on tractor.  12:15pm.  Came around after wife gave him water with sugar.  Sugar down to 50 when it was checked (after sugar water).  Eye exam 12/2012.  Foot exam today.  Review of Systems Per HPI    Objective:   Physical Exam  Nursing note and vitals reviewed. Constitutional: He appears well-developed and well-nourished. No distress.  HENT:  Head: Normocephalic and atraumatic.  Right Ear: External ear normal.  Left Ear: External ear normal.  Nose: Nose normal.  Mouth/Throat: Oropharynx is clear and moist. No oropharyngeal exudate.  Eyes: Conjunctivae and EOM are normal. Pupils are equal, round, and reactive to light. No scleral icterus.  Neck: Normal range of motion. Neck supple.  Cardiovascular: Normal rate, regular rhythm, normal heart sounds and intact distal pulses.   No murmur heard. Pulmonary/Chest: Effort normal and breath sounds normal. No respiratory distress. He has no wheezes. He has no rales.  Musculoskeletal: He exhibits no edema.  Diabetic foot exam: Normal inspection No skin breakdown No calluses  Normal DP/PT pulses Normal sensation to light touch and monofilament Thickened nails  Lymphadenopathy:    He has no cervical adenopathy.  Skin: Skin is warm and dry. No rash noted.  Psychiatric: He has a normal mood and affect.       Assessment & Plan:

## 2013-10-09 NOTE — Progress Notes (Signed)
Pre-visit discussion using our clinic review tool. No additional management support is needed unless otherwise documented below in the visit note.

## 2013-10-09 NOTE — Patient Instructions (Signed)
We will check for lantus samples today. Blood work today - we will call you with results. On days you are planning to be more active outside, decrease morning lantus to 40-45 units.  No other changes today. If another episode of passing out please let me know to send you to diabetes doctor.

## 2013-10-09 NOTE — Assessment & Plan Note (Addendum)
Hypoglycemic episode leading to syncope yesterday - in setting of increased activity with insulin use. Discussed need to decrease am insulin (to 40u lantus) if planning on being active outside - advised against tractor use in his h/o hypoglycemia. If persistent hypoglycemic episodes, will refer to endo. Continue SSI and mealtime correction as up to now, check blood work today. Recommended schedule eye exam as due.

## 2013-10-15 ENCOUNTER — Ambulatory Visit: Payer: Medicare Other | Admitting: Internal Medicine

## 2013-10-29 DIAGNOSIS — Z85828 Personal history of other malignant neoplasm of skin: Secondary | ICD-10-CM | POA: Diagnosis not present

## 2013-10-29 DIAGNOSIS — L570 Actinic keratosis: Secondary | ICD-10-CM | POA: Diagnosis not present

## 2013-10-29 DIAGNOSIS — L259 Unspecified contact dermatitis, unspecified cause: Secondary | ICD-10-CM | POA: Diagnosis not present

## 2013-10-31 ENCOUNTER — Ambulatory Visit (INDEPENDENT_AMBULATORY_CARE_PROVIDER_SITE_OTHER): Payer: Medicare Other | Admitting: Internal Medicine

## 2013-10-31 ENCOUNTER — Encounter: Payer: Self-pay | Admitting: Internal Medicine

## 2013-10-31 VITALS — BP 128/68 | HR 72 | Temp 97.8°F | Resp 10 | Ht 71.0 in | Wt 198.0 lb

## 2013-10-31 DIAGNOSIS — E1140 Type 2 diabetes mellitus with diabetic neuropathy, unspecified: Secondary | ICD-10-CM

## 2013-10-31 DIAGNOSIS — E1149 Type 2 diabetes mellitus with other diabetic neurological complication: Secondary | ICD-10-CM | POA: Diagnosis not present

## 2013-10-31 DIAGNOSIS — E1142 Type 2 diabetes mellitus with diabetic polyneuropathy: Secondary | ICD-10-CM

## 2013-10-31 NOTE — Progress Notes (Signed)
Patient ID: Frank Simpson, male   DOB: 1931-03-31, 79 y.o.   MRN: [id number]  HPI: Frank Simpson is a 79 y.o.-year-old male, referred by his PCP, Dr. Danise Mina, for management of DM2, insulin-dependent, uncontrolled, with complications (CAD - s/p STEMI, cerebrovascular ds. - s/p CVA, CHF, nephropathy, neuropathy, DR). He is here with his wife who offers most of the history.  Patient has been diagnosed with diabetes in ~1980s; he started insulin in ~2013.  Last hemoglobin A1c was: Lab Results  Component Value Date   HGBA1C 9.0* 10/09/2013   HGBA1C 9.1* 04/17/2013   HGBA1C 8.2* 10/15/2012   Pt is on a regimen of: - Lantus 45 units in am and 35 units in hs - vials - Humalog 15 units with lunch and dinner (but not if sugars <150) + SSI: target 150, ISF 25 He injects insulin at the time of the meals, does not keep the needle again for 10 seconds after injection. He also injects mostly in his deltoid, and not in his stomach. He keeps the insulin in use in the fridge. He was on SUs, but developed hypoglycemia.  He was on Januvia and Metformin, too.   Pt checks his sugars 3-4 a day and they are (no log): - am: 50s-225 - 2h after b'fast: n/c - before lunch: 200s - 2h after lunch: n/c - before dinner: 200-400s - 2h after dinner: n/c - bedtime: n/c - nighttime: n/c  No lows. Lowest sugar was 40, and a recent low (did not check sugars then) - at lunch, but he worked in the yard all morning; he has hypoglycemia awareness at 16.  Highest sugar was 500s - last in last 6 mo, but average sugars 200s.  Pt's meals are: - Breakfast: sausage, toast, coffee - Lunch: soup, sandwich - Dinner: chicken, beans, tomatoes, greens - Snacks: apple, banana once a day  - He has CKD stage 3, last BUN/creatinine:  Lab Results  Component Value Date   BUN 23 10/09/2013   CREATININE 1.7* 10/09/2013   - Pt has HL - last set of lipids: Lab Results  Component Value Date   CHOL 111 08/21/2013   HDL 43.00  08/21/2013   LDLCALC 50 08/21/2013   LDLDIRECT 72 11/17/2010   TRIG 91.0 08/21/2013   CHOLHDL 3 08/21/2013  On Crestor 40. - last eye exam was in 1 year ago. + DR.  - no numbness and tingling in his feet.  Pt has FH of DM in mother, brother.  ROS: Constitutional: no weight gain/loss, + fatigue, no subjective hyperthermia/hypothermia, + poor sleep Eyes: no blurry vision, no xerophthalmia ENT: no sore throat, no nodules palpated in throat, no dysphagia/odynophagia, no hoarseness, + decreased hearing Cardiovascular: + CP ever since his shingles outbreak a year ago/no SOB/palpitations/leg swelling Respiratory: no cough/SOB Gastrointestinal: no N/V/D/C Musculoskeletal: + muscle/+ joint aches Skin: no rashes, + hair loss, + easy bruising, + skin cancer >> 1 small bullae (1 cm diameter on dorsum of hands) Neurological: no tremors/numbness/tingling/dizziness Psychiatric: no depression/anxiety  Past Medical History  Diagnosis Date  . Hypertension   . Diabetes mellitus 1994    dm edu 2012  . Hyperlipidemia   . Osteoarthritis   . CRI (chronic renal insufficiency)     baseline Cr seems to be 1.7-1.8  . Urinary incontinence     s/p PTNS didn't help  . Glaucoma     and cataracts  . History of melanoma   . CAD (coronary artery disease)  07/2012 acute STEMI, mid LAD PCI - DES; cath 09/2012 patent LAD stent, non-hemodynamically significant Left Main/LAD disease, EF 55%  . Thrombocytopenia   . BCC (basal cell carcinoma of skin)     L neck (Dr. Sherrye Payor)  . Complication of anesthesia     confused after cath 10/15/2012  . CVA (cerebral infarction) 09/2012    remote anterior limb of left internal capsule  . Post herpetic neuralgia   . Shingles in March 2014    right chest, across the back  . Syncope 04/02/2013  . Blurred vision    Past Surgical History  Procedure Laterality Date  . Replacement total knee  04/2010    RIGHT KNEE  . Tonsillectomy    . Cardiovascular stress test  04/27/2010     EF 75%, nuclear stress test with normal perfusion, no ischemia  . Foot surgery      metal pin in place  . Right shoulder    . Finger surgery      amputated finger  . Lexiscan myoview  10/2011    negative for ischemia  . Cataract extraction  12/12, 1/13    bilateral  . Coronary stent placement  07/2012    DES to mid LAD for STEMI  . Cardiac catheterization  10/15/2012  . Carotid US  03/2013    WNL  . US echocardiography  03/2013    inf/septal hypokinesis, mild LVH, EF 45%, LA mildly dilated   History   Social History  . Marital Status: Married    Spouse Name: Pamala Hurry    Number of Children: 1  . Years of Education: 11   Occupational History  . retired    Social History Main Topics  . Smoking status: Never Smoker   . Smokeless tobacco: Never Used  . Alcohol Use: No  . Drug Use: No   Social History Narrative   Caffeine: 2-3 caffeinated drinks/day   Lives with wife Pamala Hurry , no pets   Occupation: retired, used to work cigarette factory   Activity: works around house   Diet: healthy overall.  Good fruits and vegetables, good amt water   Current Outpatient Prescriptions on File Prior to Visit  Medication Sig Dispense Refill  . acetaminophen (TYLENOL) 500 MG tablet Take 1 tablet (500 mg total) by mouth every 6 (six) hours as needed for pain.      [name] Kitchen amLODipine (NORVASC) 5 MG tablet TAKE 1 TABLET BY MOUTH EVERY DAY  90 tablet  3  . aspirin 81 MG tablet Take 1 tablet (81 mg total) by mouth daily.  30 tablet    . BRILINTA 90 MG TABS tablet TAKE 1 TABLET (90 MG TOTAL) BY MOUTH 2 (TWO) TIMES DAILY.  60 tablet  6  . glucose blood (ONE TOUCH ULTRA TEST) test strip Check four times daily, 250.42  120 each  11  . HYDROcodone-acetaminophen (NORCO/VICODIN) 5-325 MG per tablet Take 0.5 tablets by mouth at bedtime as needed for pain.  30 tablet  0  . insulin glargine (LANTUS) 100 UNIT/ML injection Inject into the skin. 50 units in am and 35 units at night.  QS 1 month  10 mL    .  insulin lispro (HUMALOG) 100 UNIT/ML injection Inject 15 units subcutaneously with lunch and dinner, plus sliding scale correction  10 mL    . Insulin Pen Needle 31G X 8 MM MISC Use as directed with Lantus solostar. Dx 250.62.  100 each  5  . isosorbide mononitrate (IMDUR) 30 MG 24  hr tablet Take 1 tablet (30 mg total) by mouth daily.  60 tablet  6  . nitroGLYCERIN (NITROSTAT) 0.4 MG SL tablet Place 1 tablet (0.4 mg total) under the tongue every 5 (five) minutes as needed for chest pain. Up to three doses,if pain continues call 911  25 tablet  3  . rosuvastatin (CRESTOR) 40 MG tablet Take 1 tablet (40 mg total) by mouth daily.  30 tablet  11  . Tamsulosin HCl (FLOMAX) 0.4 MG CAPS Take 1 capsule (0.4 mg total) by mouth daily.  90 capsule  3   No current facility-administered medications on file prior to visit.   Allergies  Allergen Reactions  . Januvia [Sitagliptin Phosphate] Other (See Comments)    Possibly affected kidneys?  . Metformin And Related Other (See Comments)    Affected kidneys  . Oxycodone     Severe confusion  . Penicillins Other (See Comments)    Reaction long time ago - doesn't remember   Family History  Problem Relation Age of Onset  . Stroke Mother     hemorrhage  . Diabetes Mother   . Cancer Father     lung  . Diabetes Brother    PE: Pulse 72  Temp(Src) 97.8 F (36.6 C) (Oral)  Resp 10  Wt 198 lb (89.812 kg)  SpO2 97% Wt Readings from Last 3 Encounters:  10/31/13 198 lb (89.812 kg)  10/09/13 194 lb 8 oz (88.225 kg)  08/21/13 204 lb (92.534 kg)   Constitutional: overweight, in NAD Eyes: PERRLA, EOMI, no exophthalmos ENT: moist mucous membranes, no thyromegaly, no cervical lymphadenopathy Cardiovascular: RRR, No MRG Respiratory: CTA B Gastrointestinal: abdomen soft, NT, ND, BS+ Musculoskeletal: no deformities, strength intact in all 4 Skin: moist, warm, no rashes Neurological: no tremor with outstretched hands, DTR normal in all 4  ASSESSMENT: 1.  DM2, insulin-dependent, uncontrolled, with complications - CAD - s/p STEMI 07/2012 - cerebrovascular disease - s/p CVA - CHF - nephropathy - neuropathy - DR  PLAN:  1. Patient with long-standing, recently more uncontrolled diabetes, basal-bolus regimen, within imbalance between the large dose of insulin and the smaller dose of bolus insulin. Upon questioning, the patient is not taking his mealtime insulin if his sugars are lower than 150, and sometimes even when his sugars are in the 200s. - We discussed about the fact that he should take his mealtime insulin if he plans to eat the meal, however he should take into account the pre-meal CBG and only take extra insulin if this is higher than 150.  - I suggested the following changes in his insulin regimen:  Patient Instructions  Decrease the Lantus to 45 units at bedtime. Take Humalog 12 units with every meal (3x a day) - please inject 15 min before the meal. Add the following Humalog Sliding scale: - 150-175: + 1 unit  - 176-200: + 2 units  - 201-225: + 3 units  - 226-250: + 4 units  - 251-275: + 5 units - >276: + 6 units Please take 5 units less Humalog with the meal before you plan to exercise. - I advised him that his sugars my ability higher after we decreased the Lantus, but he should start taking the Humalog 3 times a day and this will help. For him, due to his age, comorbidities, the fact that he falls (unstable right knee - walks with a cane), and the fact that he does not have good hypoglycemia awareness, I would like to absolutely avoid low blood  sugars - I also discussed with him and his wife about correct insulin injection techniques as he is not injecting the insulin correctly  - continue to check sugars at different times of the day and write them down - check 4 times a day, rotating checks - given sugar log and advised how to fill it and to bring it at next appt  - given foot care handout and explained the principles  -  given instructions for hypoglycemia management "15-15 rule"  - advised for yearly eye exams - he needs to schedule a new one - Return to clinic in 1 mo with sugar log

## 2013-10-31 NOTE — Patient Instructions (Addendum)
Please return in 1 month with your sugar log.  Decrease the Lantus to 45 units at bedtime. Take Humalog 12 units with every meal (3x a day) - please inject 15 min before the meal. Add the following Humalog Sliding scale: - 150-175: + 1 unit  - 176-200: + 2 units  - 201-225: + 3 units  - 226-250: + 4 units  - 251-275: + 5 units - >276: + 6 units Please take 5 units less Humalog with the meal before you plan to exercise.  When injecting insulin:  Inject in the abdomen  Rotate the injection sites around the belly button  Change needle for each injection  Keep needle in for 10 sec after last unit of insulin in  You can keep the insulin in use out of the fridge   PATIENT INSTRUCTIONS FOR TYPE 2 DIABETES:  **Please join MyChart!** - see attached instructions about how to join   DIET AND EXERCISE Diet and exercise is an important part of diabetic treatment.  We recommended aerobic exercise in the form of brisk walking (working between 40-60% of maximal aerobic capacity, similar to brisk walking) for 150 minutes per week (such as 30 minutes five days per week) along with 3 times per week performing 'resistance' training (using various gauge rubber tubes with handles) 5-10 exercises involving the major muscle groups (upper body, lower body and core) performing 10-15 repetitions (or near fatigue) each exercise. Start at half the above goal but build slowly to reach the above goals. If limited by weight, joint pain, or disability, we recommend daily walking in a swimming pool with water up to waist to reduce pressure from joints while allow for adequate exercise.    BLOOD GLUCOSES Monitoring your blood glucoses is important for continued management of your diabetes. Please check your blood glucoses 2-4 times a day: fasting, before meals and at bedtime (you can rotate these measurements - e.g. one day check before the 3 meals, the next day check before 2 of the meals and before bedtime, etc.    HYPOGLYCEMIA (low blood sugar) Hypoglycemia is usually a reaction to not eating, exercising, or taking too much insulin/ other diabetes drugs.  Symptoms include tremors, sweating, hunger, confusion, headache, etc. Treat IMMEDIATELY with 15 grams of Carbs:   4 glucose tablets    cup regular juice/soda   2 tablespoons raisins   4 teaspoons sugar   1 tablespoon honey Recheck blood glucose in 15 mins and repeat above if still symptomatic/blood glucose <100. Please contact our office at [phone] if you have questions about how to next handle your insulin.  RECOMMENDATIONS TO REDUCE YOUR RISK OF DIABETIC COMPLICATIONS: * Take your prescribed MEDICATION(S). * Follow a DIABETIC diet: Complex carbs, fiber rich foods, heart healthy fish twice weekly, (monounsaturated and polyunsaturated) fats * AVOID saturated/trans fats, high fat foods, >2,300 mg salt per day. * EXERCISE at least 5 times a week for 30 minutes or preferably daily.  * DO NOT SMOKE OR DRINK more than 1 drink a day. * Check your FEET every day. Do not wear tightfitting shoes. Contact us if you develop an ulcer * See your EYE doctor once a year or more if needed * Get a FLU shot once a year * Get a PNEUMONIA vaccine once before and once after age 46 years  GOALS:  * Your Hemoglobin A1c of <7%  * fasting sugars need to be <130 * after meals sugars need to be <180 (2h after you start eating) *  Your Systolic BP should be 993 or lower  * Your Diastolic BP should be 80 or lower  * Your HDL (Good Cholesterol) should be 40 or higher  * Your LDL (Bad Cholesterol) should be 100 or lower  * Your Triglycerides should be 150 or lower  * Your Urine microalbumin (kidney function) should be <30 * Your Body Mass Index should be 25 or lower   We will be glad to help you achieve these goals. Our telephone number is: [phone].

## 2013-11-25 ENCOUNTER — Other Ambulatory Visit: Payer: Self-pay | Admitting: Cardiology

## 2013-11-28 DIAGNOSIS — D239 Other benign neoplasm of skin, unspecified: Secondary | ICD-10-CM

## 2013-11-28 DIAGNOSIS — C4491 Basal cell carcinoma of skin, unspecified: Secondary | ICD-10-CM

## 2013-11-28 HISTORY — DX: Other benign neoplasm of skin, unspecified: D23.9

## 2013-11-28 HISTORY — DX: Basal cell carcinoma of skin, unspecified: C44.91

## 2013-12-03 ENCOUNTER — Ambulatory Visit: Payer: Medicare Other | Admitting: Internal Medicine

## 2013-12-04 ENCOUNTER — Ambulatory Visit (INDEPENDENT_AMBULATORY_CARE_PROVIDER_SITE_OTHER): Payer: Medicare Other | Admitting: Internal Medicine

## 2013-12-04 ENCOUNTER — Encounter: Payer: Self-pay | Admitting: Internal Medicine

## 2013-12-04 VITALS — BP 128/66 | HR 64 | Temp 97.4°F | Resp 12 | Ht 71.0 in | Wt 201.0 lb

## 2013-12-04 DIAGNOSIS — E1140 Type 2 diabetes mellitus with diabetic neuropathy, unspecified: Secondary | ICD-10-CM

## 2013-12-04 DIAGNOSIS — E1142 Type 2 diabetes mellitus with diabetic polyneuropathy: Secondary | ICD-10-CM | POA: Diagnosis not present

## 2013-12-04 DIAGNOSIS — IMO0002 Reserved for concepts with insufficient information to code with codable children: Secondary | ICD-10-CM

## 2013-12-04 DIAGNOSIS — E1149 Type 2 diabetes mellitus with other diabetic neurological complication: Secondary | ICD-10-CM

## 2013-12-04 DIAGNOSIS — E1165 Type 2 diabetes mellitus with hyperglycemia: Principal | ICD-10-CM

## 2013-12-04 MED ORDER — "INSULIN SYRINGE-NEEDLE U-100 31G X 5/16"" 0.5 ML MISC": Status: DC

## 2013-12-04 NOTE — Progress Notes (Signed)
Patient ID: Frank Simpson, male   DOB: 01/16/1931, 80 y.o.   MRN: [id number]  HPI: Frank Simpson is a 80 y.o.-year-old male, returning for f/u for DM2, dx 1980s, insulin-dependent since 2013, uncontrolled, with complications (CAD - s/p STEMI, cerebrovascular ds. - s/p CVA, CHF, nephropathy, neuropathy, DR). He is here with his wife who offers most of the history. Last visit 1 mo ago.  Last hemoglobin A1c was:, insulin-dependent since 2013, uncontrolled, with complications (CAD - s/p STEMI, cerebrovascular ds. - s/p CVA, CHF, nephropathy, neuropathy, DR). He is here with his wife who offers most of the history. Last visit 1 mo ago.  Last hemoglobin A1c was: Lab Results  Component Value Date   HGBA1C 9.0* 10/09/2013   HGBA1C 9.1* 04/17/2013   HGBA1C 8.2* 10/15/2012   Pt was on a regimen of: - Lantus 45 units in am and 35 units in hs - vials - Humalog 15 units with lunch and dinner (but not if sugars <150!!!) + SSI: target 150, ISF 25 He was not injecting insulin correctly before last visit. He was on SUs, but developed hypoglycemia.  He was on Januvia and Metformin, too.   Pt is now on: - Lantus 45 units at bedtime. - Humalog 12 units 3x a day - Humalog Sliding scale - added at last visit: - 150-175: + 1 unit  - 176-200: + 2 units  - 201-225: + 3 units  - 226-250: + 4 units  - 251-275: + 5 units - >276: + 6 units !take 5 units less Humalog with the meal before exercise.  Pt checks his sugars 3-4 a day and they are improved (brings detailed log): - am: 50s-225 >> 54-216, but most 80-120s - 2h after b'fast: n/c >> 48 (after the 63), 136-280 - before lunch: 200s >> 56-225, most 100-150s - 2h after lunch: n/c >> 85 (one check) - before dinner: 200-400s >> 79-280 (most 170-low 200s) - 2h after dinner: n/c - bedtime: n/c - nighttime: n/c Lows after increased activity  No lows. Lowest sugar was 40, and a recent low (did not check sugars then) - at lunch, but he worked in the yard all morning; he has hypoglycemia awareness at 55.  Highest sugar was 500s - last in last 6 mo, but average sugars 200s.  Pt's meals are: - Breakfast: sausage, toast, coffee - Lunch: soup, sandwich - Dinner: chicken, beans, tomatoes, greens -  Snacks: apple, banana once a day  - He has CKD stage 3, last BUN/creatinine:  Lab Results  Component Value Date   BUN 23 10/09/2013   CREATININE 1.7* 10/09/2013   - Pt has HL - last set of lipids: Lab Results  Component Value Date   CHOL 111 08/21/2013   HDL 43.00 08/21/2013   LDLCALC 50 08/21/2013   LDLDIRECT 72 11/17/2010   TRIG 91.0 08/21/2013   CHOLHDL 3 08/21/2013  On Crestor 40. - last eye exam was in 1 year ago. + DR.  - no numbness and tingling in his feet.  I reviewed pt's medications, allergies, PMH, social hx, family hx and no changes required, except as mentioned above.  ROS: Constitutional: no weight gain/loss, + fatigue, no subjective hyperthermia/hypothermia, + poor sleep Eyes: no blurry vision, no xerophthalmia ENT: no sore throat, no nodules palpated in throat, no dysphagia/odynophagia, no hoarseness, + decreased hearing Cardiovascular: no CP/no SOB/palpitations/leg swelling Respiratory: no cough/SOB Gastrointestinal: no N/V/D/C Musculoskeletal: + muscle/+ joint aches Skin: no rashes  PE: BP 128/66  Pulse 64  Temp(Src) 97.4 F (36.3 C) (Oral)  Resp 12  Ht 5' 11" (1.803 m)  Wt 201 lb (91.173 kg)  BMI 28.05 kg/m2  SpO2 98% Wt Readings from Last 3 Encounters:  12/04/13 201 lb (91.173 kg)  10/31/13 198 lb (89.812 kg)  10/09/13 194 lb 8 oz (88.225 kg)   Constitutional: overweight, in NAD Eyes: PERRLA, EOMI, no exophthalmos ENT: moist mucous membranes, no thyromegaly, no cervical lymphadenopathy Cardiovascular: RRR, No MRG Respiratory: CTA B Gastrointestinal: abdomen soft, NT, ND, BS+ Musculoskeletal: no deformities, strength intact in all 4 Skin: moist, warm, no rashes Neurological: no tremor with outstretched hands, DTR normal in all 4  ASSESSMENT: 1. DM2, insulin-dependent, uncontrolled, with complications - CAD - s/p STEMI 07/2012 - cerebrovascular disease - s/p CVA - CHF - nephropathy - neuropathy - DR  PLAN:  1. Patient with  long-standing, uncontrolled diabetes, on basal-bolus insulin regimen, with previous imbalance between the large dose of basal insulin and the smaller dose of bolus insulin. At last visit, we balanced the doses and advised him how to inject correctly >> sugars improved, although there is still variability - I suggested the following changes in his insulin regimen:  Patient Instructions  Please continue current insulin regimen. If sugars <80 before a meal, please take ~60% of the mealtime insulin dose. If sugars <60 before a meal, please take ~30% of the mealtime insulin dose. Please return in 2 months with your sugar log.  Please ask Frank Simpson to check your Hba1c next week. - For him, due to his age, comorbidities, the fact that he falls (unstable right knee - walks with a cane), and the fact that he does not have good hypoglycemia awareness, I would like to absolutely avoid low blood sugars - continue to check sugars at different times of the day and write them down - check 4 times a day, rotating checks - given sugar log and advised how to fill it and to bring it at next appt  - Return to clinic in 2 mo with sugar log - but check A1c next week at appt with PCP (not 3 mo yet from previous)

## 2013-12-04 NOTE — Patient Instructions (Signed)
Please continue current insulin regimen. If sugars <80 before a meal, please take ~60% of the mealtime insulin dose. If sugars <60 before a meal, please take ~30% of the mealtime insulin dose. Please return in 2 months with your sugar log.  Please ask Jeryl Columbia to check your Hba1c next week.

## 2013-12-31 DIAGNOSIS — C44320 Squamous cell carcinoma of skin of unspecified parts of face: Secondary | ICD-10-CM | POA: Diagnosis not present

## 2013-12-31 DIAGNOSIS — Z85828 Personal history of other malignant neoplasm of skin: Secondary | ICD-10-CM | POA: Diagnosis not present

## 2013-12-31 DIAGNOSIS — L570 Actinic keratosis: Secondary | ICD-10-CM | POA: Diagnosis not present

## 2013-12-31 DIAGNOSIS — L821 Other seborrheic keratosis: Secondary | ICD-10-CM | POA: Diagnosis not present

## 2013-12-31 DIAGNOSIS — L820 Inflamed seborrheic keratosis: Secondary | ICD-10-CM | POA: Diagnosis not present

## 2013-12-31 DIAGNOSIS — D239 Other benign neoplasm of skin, unspecified: Secondary | ICD-10-CM | POA: Diagnosis not present

## 2013-12-31 DIAGNOSIS — D2320 Other benign neoplasm of skin of unspecified ear and external auricular canal: Secondary | ICD-10-CM | POA: Diagnosis not present

## 2014-01-03 ENCOUNTER — Ambulatory Visit (INDEPENDENT_AMBULATORY_CARE_PROVIDER_SITE_OTHER): Payer: Medicare Other | Admitting: Family Medicine

## 2014-01-03 ENCOUNTER — Encounter: Payer: Self-pay | Admitting: Family Medicine

## 2014-01-03 VITALS — BP 140/78 | HR 60 | Temp 97.8°F | Ht 71.0 in | Wt 203.5 lb

## 2014-01-03 DIAGNOSIS — R21 Rash and other nonspecific skin eruption: Secondary | ICD-10-CM | POA: Diagnosis not present

## 2014-01-03 MED ORDER — MUPIROCIN 2 % EX OINT: 1.0000 "application " | TOPICAL_OINTMENT | Freq: Two times a day (BID) | CUTANEOUS | Status: DC

## 2014-01-03 NOTE — Progress Notes (Signed)
Subjective:    Patient ID: Frank Simpson, male    DOB: July 04, 1931, 80 y.o.   MRN: [id number]  HPI  80 year old male pt of Dr. Darnell Level 's with DM, CRI, CAD and hx of shingles/PHN presents to clinic today with new onset rash above right eye in last 3 days.  He initially had red area, that began to drain clear liquid. No clear blister seen. Non tender, not itchy.  He is currently being treated by Derm ( Dr. Pablo Ledger) for ? Sq cell ca on right earlobe.[name] Kitchen Has follow up with Derm next Tuesday 2/10 at 4 PM for re-excision.   Review of Systems  Constitutional: Negative for fever and fatigue.  HENT: Negative for mouth sores.   Eyes: Negative for photophobia, pain, redness and visual disturbance.  Respiratory: Negative for shortness of breath.   Cardiovascular: Negative for chest pain.       Objective:   Physical Exam  Constitutional: Vital signs are normal. He appears well-developed and well-nourished.  HENT:  Head: Normocephalic.  Right Ear: Hearing normal.  Left Ear: Hearing normal.  Nose: Nose normal.  Mouth/Throat: Oropharynx is clear and moist and mucous membranes are normal.  Eyes: Conjunctivae, EOM and lids are normal. Pupils are equal, round, and reactive to light.  Neck: Trachea normal. Carotid bruit is not present. No mass and no thyromegaly present.  Cardiovascular: Normal rate, regular rhythm and normal pulses.  Exam reveals no gallop, no distant heart sounds and no friction rub.   No murmur heard. No peripheral edema  Pulmonary/Chest: Effort normal and breath sounds normal. No respiratory distress.  Skin: Skin is warm, dry and intact. No rash noted.  Right forehead with pink 1 cm patch, with honey colored crust, nontender, no skin hypersensitivity.  Psychiatric: He has a normal mood and affect. His speech is normal and behavior is normal. Thought content normal.          Assessment & Plan:

## 2014-01-03 NOTE — Assessment & Plan Note (Signed)
Not clearly consistent with shingles as he has no pain , no sensitivity and no clear vesicles.  Honey colored crust concerning for staph infection.. Treat with mupirocin ointment and keep follow up with Derm.

## 2014-01-03 NOTE — Progress Notes (Signed)
Pre-visit discussion using our clinic review tool. No additional management support is needed unless otherwise documented below in the visit note.

## 2014-01-03 NOTE — Patient Instructions (Signed)
Start mupirocin ointment twice daily.  Keep follow up with Derm next week.  Call on call nurse/MD if spreading or if significant pain in area.

## 2014-01-07 DIAGNOSIS — Z85828 Personal history of other malignant neoplasm of skin: Secondary | ICD-10-CM | POA: Diagnosis not present

## 2014-01-07 DIAGNOSIS — C44320 Squamous cell carcinoma of skin of unspecified parts of face: Secondary | ICD-10-CM | POA: Diagnosis not present

## 2014-01-07 DIAGNOSIS — D0439 Carcinoma in situ of skin of other parts of face: Secondary | ICD-10-CM | POA: Diagnosis not present

## 2014-01-07 DIAGNOSIS — D0430 Carcinoma in situ of skin of unspecified part of face: Secondary | ICD-10-CM | POA: Diagnosis not present

## 2014-01-09 ENCOUNTER — Ambulatory Visit (INDEPENDENT_AMBULATORY_CARE_PROVIDER_SITE_OTHER): Payer: Medicare Other | Admitting: Family Medicine

## 2014-01-09 ENCOUNTER — Encounter: Payer: Self-pay | Admitting: Family Medicine

## 2014-01-09 VITALS — BP 152/84 | HR 62 | Temp 97.2°F | Wt 204.2 lb

## 2014-01-09 DIAGNOSIS — E1140 Type 2 diabetes mellitus with diabetic neuropathy, unspecified: Secondary | ICD-10-CM

## 2014-01-09 DIAGNOSIS — R413 Other amnesia: Secondary | ICD-10-CM | POA: Diagnosis not present

## 2014-01-09 DIAGNOSIS — IMO0002 Reserved for concepts with insufficient information to code with codable children: Secondary | ICD-10-CM

## 2014-01-09 DIAGNOSIS — E1149 Type 2 diabetes mellitus with other diabetic neurological complication: Secondary | ICD-10-CM | POA: Diagnosis not present

## 2014-01-09 DIAGNOSIS — N183 Chronic kidney disease, stage 3 unspecified: Secondary | ICD-10-CM

## 2014-01-09 DIAGNOSIS — E1142 Type 2 diabetes mellitus with diabetic polyneuropathy: Secondary | ICD-10-CM

## 2014-01-09 DIAGNOSIS — E1122 Type 2 diabetes mellitus with diabetic chronic kidney disease: Secondary | ICD-10-CM

## 2014-01-09 DIAGNOSIS — E1165 Type 2 diabetes mellitus with hyperglycemia: Principal | ICD-10-CM

## 2014-01-09 LAB — RENAL FUNCTION PANEL
ALBUMIN: 3.7 g/dL (ref 3.5–5.2)
BUN: 18 mg/dL (ref 6–23)
CO2: 28 mEq/L (ref 19–32)
Calcium: 9.1 mg/dL (ref 8.4–10.5)
Chloride: 107 mEq/L (ref 96–112)
Creatinine, Ser: 1.5 mg/dL (ref 0.4–1.5)
GFR: 48.1 mL/min — AB (ref >60.00)
GLUCOSE: 88 mg/dL (ref 70–99)
PHOSPHORUS: 2.9 mg/dL (ref 2.3–4.6)
POTASSIUM: 4.5 meq/L (ref 3.5–5.1)
SODIUM: 142 meq/L (ref 135–145)

## 2014-01-09 LAB — HEMOGLOBIN A1C: HEMOGLOBIN A1C: 7.2 % — AB (ref 4.6–6.5)

## 2014-01-09 LAB — VITAMIN B12: VITAMIN B 12: 179 pg/mL — AB (ref 211–911)

## 2014-01-09 NOTE — Assessment & Plan Note (Signed)
Appreciate endo assistance. Check A1c today. They have decreased long acting insulin to 30 u nightly 2/2 persistent night time hypoglycemia to 30s.

## 2014-01-09 NOTE — Assessment & Plan Note (Signed)
Check renal panel today.

## 2014-01-09 NOTE — Progress Notes (Signed)
Pre-visit discussion using our clinic review tool. No additional management support is needed unless otherwise documented below in the visit note.

## 2014-01-09 NOTE — Progress Notes (Signed)
BP 152/84  Pulse 62  Temp(Src) 97.2 F (36.2 C) (Oral)  Wt 204 lb 4 oz (92.647 kg)   CC: follow up visit  Subjective:    Patient ID: Frank Simpson, male    DOB: [birth year], 80 y.o.   MRN: [id number]  HPI: Frank Simpson is a 80 y.o. male presenting on 01/09/2014 with Follow-up  Since last seen by myself, established care with endocrinology for DM with change in insulin regimen.  Due for A1c recheck and he will f/u with endo 01/2014.  Feels sugars doing better but has noticed some low night time sugars that he needed OJ to improve (down to 30s) so they decreased lantus to 30 u nightly. Compliant with mealtime insulin (15 u + SSI).  Morning sugars 70-80s.  Highest sugar was 300 (last week).  No passing out episodes. Lab Results  Component Value Date   HGBA1C 9.0* 10/09/2013  He was also seen by Dr. Diona Browner for facial rash concern for impetigo, treated with mupirocin cream.  This is improving. He is also followed by derm for squamous cell cancer of right earlobe s/p re excision.  CKD - avoids NSAIDs. Lab Results  Component Value Date   CREATININE 1.7* 10/09/2013    Relevant past medical, surgical, family and social history reviewed and updated. Allergies and medications reviewed and updated. Current Outpatient Prescriptions on File Prior to Visit  Medication Sig  . acetaminophen (TYLENOL) 500 MG tablet Take 1 tablet (500 mg total) by mouth every 6 (six) hours as needed for pain.  [name] Kitchen amLODipine (NORVASC) 5 MG tablet TAKE 1 TABLET BY MOUTH EVERY DAY  . aspirin 81 MG tablet Take 1 tablet (81 mg total) by mouth daily.  [name] Kitchen BRILINTA 90 MG TABS tablet TAKE 1 TABLET (90 MG TOTAL) BY MOUTH 2 (TWO) TIMES DAILY.  [name] Kitchen desonide (DESOWEN) 0.05 % ointment   . glucose blood (ONE TOUCH ULTRA TEST) test strip Check four times daily, 250.42  . insulin glargine (LANTUS) 100 UNIT/ML injection Inject into the skin. 50 units in am and 30 units at night.  QS 1 month  . insulin lispro (HUMALOG) 100 UNIT/ML  injection Inject 15 units subcutaneously with lunch and dinner, plus sliding scale correction  . Insulin Pen Needle 31G X 8 MM MISC Use as directed with Lantus solostar. Dx 250.62.  [name] Kitchen Insulin Syringe-Needle U-100 (BD INSULIN SYRINGE ULTRAFINE) 31G X 5/16" 0.5 ML MISC Inject 4x a day  . isosorbide mononitrate (IMDUR) 30 MG 24 hr tablet Take 1 tablet (30 mg total) by mouth daily.  . mupirocin ointment (BACTROBAN) 2 % Place 1 application into the nose 2 (two) times daily.  . nitroGLYCERIN (NITROSTAT) 0.4 MG SL tablet Place 1 tablet (0.4 mg total) under the tongue every 5 (five) minutes as needed for chest pain. Up to three doses,if pain continues call 911  . rosuvastatin (CRESTOR) 40 MG tablet Take 1 tablet (40 mg total) by mouth daily.  . Tamsulosin HCl (FLOMAX) 0.4 MG CAPS Take 1 capsule (0.4 mg total) by mouth daily.   No current facility-administered medications on file prior to visit.    Review of Systems Per HPI unless specifically indicated above    Objective:    BP 152/84  Pulse 62  Temp(Src) 97.2 F (36.2 C) (Oral)  Wt 204 lb 4 oz (92.647 kg)  Physical Exam  Nursing note and vitals reviewed. Constitutional: He appears well-developed and well-nourished. No distress.  HENT:  Mouth/Throat: Oropharynx is clear and moist. No  oropharyngeal exudate.  Cardiovascular: Normal rate, regular rhythm, normal heart sounds and intact distal pulses.   No murmur heard. Pulmonary/Chest: Effort normal and breath sounds normal. No respiratory distress. He has no wheezes. He has no rales.  Musculoskeletal: He exhibits edema (tr pedal edema).  Skin: Skin is warm and dry. No rash noted.  Psychiatric:  Tells long story of him as a child that is unrelated to discussion at office visit   Results for orders placed in visit on 10/09/13  HEMOGLOBIN A1C      Result Value Ref Range   Hemoglobin A1C 9.0 (*) 4.6 - 6.5 %  BASIC METABOLIC PANEL      Result Value Ref Range   Sodium 135  135 - 145 mEq/L    Potassium 4.3  3.5 - 5.1 mEq/L   Chloride 102  96 - 112 mEq/L   CO2 26  19 - 32 mEq/L   Glucose, Bld 330 (*) 70 - 99 mg/dL   BUN 23  6 - 23 mg/dL   Creatinine, Ser 1.7 (*) 0.4 - 1.5 mg/dL   Calcium 9.0  8.4 - 10.5 mg/dL   GFR 41.90 (*) >60.00 mL/min      Assessment & Plan:   Problem List Items Addressed This Visit   DM type 2 causing CKD stage 3     Check renal panel today.    Memory loss     Persistent memory trouble.  eval by neurology with positive ANA, never returned for f/u. Encouraged they f/u with neurology.  ?vascular vs other dementia. Check B12 today.    Relevant Orders      Vitamin B12   Type 2 diabetes, uncontrolled, with neuropathy - Primary     Appreciate endo assistance. Check A1c today. They have decreased long acting insulin to 30 u nightly 2/2 persistent night time hypoglycemia to 30s.    Relevant Orders      Hemoglobin A1c      Renal function panel       Follow up plan: Return in about 6 months (around 07/09/2014), or as needed, for wellness exam.

## 2014-01-09 NOTE — Patient Instructions (Addendum)
We will check blood work today. Keep appointment with Dr. Cruzita Lederer. Let's keep an eye on memory - if worsening I recommend returning to see Dr. Krista Blue as there are medicines that can help with memory.

## 2014-01-09 NOTE — Assessment & Plan Note (Addendum)
Persistent memory trouble.  eval by neurology with positive ANA, never returned for f/u. Encouraged they f/u with neurology.  ?vascular vs other dementia. Check B12 today.

## 2014-01-10 ENCOUNTER — Telehealth: Payer: Self-pay

## 2014-01-10 NOTE — Telephone Encounter (Signed)
Relevant patient education mailed to patient.

## 2014-01-12 ENCOUNTER — Other Ambulatory Visit: Payer: Self-pay | Admitting: Family Medicine

## 2014-01-12 MED ORDER — CYANOCOBALAMIN 1000 MCG/ML IJ SOLN: 1000.0000 ug | INTRAMUSCULAR | Status: DC

## 2014-01-16 ENCOUNTER — Ambulatory Visit (INDEPENDENT_AMBULATORY_CARE_PROVIDER_SITE_OTHER): Payer: Medicare Other

## 2014-01-16 DIAGNOSIS — E538 Deficiency of other specified B group vitamins: Secondary | ICD-10-CM | POA: Diagnosis not present

## 2014-01-16 MED ORDER — CYANOCOBALAMIN 1000 MCG/ML IJ SOLN
1000.0000 ug | Freq: Once | INTRAMUSCULAR | Status: AC
  Administered 2014-01-16: 1000 ug via INTRAMUSCULAR

## 2014-01-23 ENCOUNTER — Other Ambulatory Visit: Payer: Self-pay | Admitting: Family Medicine

## 2014-02-05 ENCOUNTER — Ambulatory Visit (INDEPENDENT_AMBULATORY_CARE_PROVIDER_SITE_OTHER): Payer: Medicare Other | Admitting: Internal Medicine

## 2014-02-05 ENCOUNTER — Encounter: Payer: Self-pay | Admitting: Internal Medicine

## 2014-02-05 ENCOUNTER — Telehealth: Payer: Self-pay | Admitting: *Deleted

## 2014-02-05 VITALS — BP 124/78 | HR 60 | Temp 98.0°F | Resp 12 | Wt 207.0 lb

## 2014-02-05 DIAGNOSIS — E1142 Type 2 diabetes mellitus with diabetic polyneuropathy: Secondary | ICD-10-CM | POA: Diagnosis not present

## 2014-02-05 DIAGNOSIS — E1149 Type 2 diabetes mellitus with other diabetic neurological complication: Secondary | ICD-10-CM | POA: Diagnosis not present

## 2014-02-05 DIAGNOSIS — E1165 Type 2 diabetes mellitus with hyperglycemia: Principal | ICD-10-CM

## 2014-02-05 DIAGNOSIS — IMO0002 Reserved for concepts with insufficient information to code with codable children: Secondary | ICD-10-CM

## 2014-02-05 DIAGNOSIS — E1140 Type 2 diabetes mellitus with diabetic neuropathy, unspecified: Secondary | ICD-10-CM

## 2014-02-05 MED ORDER — INSULIN GLARGINE 100 UNIT/ML ~~LOC~~ SOLN: 25.0000 [IU] | Freq: Every day | SUBCUTANEOUS | Status: DC

## 2014-02-05 NOTE — Telephone Encounter (Signed)
Pharmacy sent a fax requesting clarification for the dosage for Lantus. Please review and advise.

## 2014-02-05 NOTE — Progress Notes (Signed)
Patient ID: Frank Simpson, male   DOB: January 10, 1931, 80 y.o.   MRN: [id number]  HPI: Frank Simpson is a 80 y.o.-year-old male, returning for f/u for DM2, dx 1980s, insulin-dependent since 2013, uncontrolled, with complications (CAD - s/p STEMI, cerebrovascular ds. - s/p CVA, CHF, nephropathy, neuropathy, DR). He is here with his wife who offers most of the history. Last visit 2 mo ago.  Last hemoglobin A1c was: Lab Results  Component Value Date   HGBA1C 7.2* 01/09/2014   HGBA1C 9.0* 10/09/2013   HGBA1C 9.1* 04/17/2013   Pt is now on: - Lantus 45 >> 30 units at bedtime  - Humalog 12 units 3x a day - Humalog Sliding scale - added at last visit: - 150-175: + 1 unit  - 176-200: + 2 units  - 201-225: + 3 units  - 226-250: + 4 units  - 251-275: + 5 units - >276: + 6 units !take 5 units less Humalog with the meal before exercise. If sugars <80 before a meal, please take ~60% of the mealtime insulin dose. If sugars <60 before a meal, please take ~30% of the mealtime insulin dose.  Pt checks his sugars 3-4 a day and sugars are better but he also developed more lows - brings detailed log, which we reviewed together: - am: 50s-225 >> 54-216, but most 80-120s >> 47, 59, 64, 68, 76-130 - 2h after b'fast: n/c >> 48 (after the 63), 136-280 >> 67,120 - before lunch: 200s >> 56-225, most 100-150s >> 46, 49 - 257, most 70-150 - 2h after lunch: n/c >> 85 (one check) >> 38, 154, 173 - before dinner: 200-400s >> 79-280 (most 170-low 200s) >> 80-256 - 2h after dinner: n/c - bedtime: n/c - nighttime: n/c, one check 31 Has lows (see above)  - he has hypoglycemia awareness at 60.   - He has CKD stage 3, last BUN/creatinine:  Lab Results  Component Value Date   BUN 18 01/09/2014   CREATININE 1.5 01/09/2014   - Pt has HL - last set of lipids: Lab Results  Component Value Date   CHOL 111 08/21/2013   HDL 43.00 08/21/2013   LDLCALC 50 08/21/2013   LDLDIRECT 72 11/17/2010   TRIG 91.0 08/21/2013   CHOLHDL 3 08/21/2013  On Crestor 40. - last eye exam was in 1 year ago. + DR.  - no numbness and tingling in his feet.  I reviewed pt's medications, allergies, PMH, social hx, family hx and no changes required, except as mentioned above.  ROS: Constitutional: no weight gain/loss, no fatigue, no subjective hyperthermia/hypothermia Eyes: no blurry vision, no xerophthalmia ENT: no sore throat, no nodules palpated in throat, no dysphagia/odynophagia, no hoarseness, + decreased hearing Cardiovascular: no CP/no SOB/palpitations/leg swelling Respiratory: no cough/SOB Gastrointestinal: no N/V/D/C Musculoskeletal: + muscle/+ joint aches Skin: no rashes  PE: BP 124/78  Pulse 60  Temp(Src) 98 F (36.7 C) (Oral)  Resp 12  Wt 207 lb (93.895 kg)  SpO2 98% Wt Readings from Last 3 Encounters:  02/05/14 207 lb (93.895 kg)  01/09/14 204 lb 4 oz (92.647 kg)  01/03/14 203 lb 8 oz (92.307 kg)  Body mass index is 28.88 kg/(m^2).  Constitutional: overweight, in NAD Eyes: PERRLA, EOMI, no exophthalmos ENT: moist mucous membranes, no thyromegaly, no cervical lymphadenopathy Cardiovascular: RRR, No MRG Respiratory: CTA B Gastrointestinal: abdomen soft, NT, ND, BS+ Musculoskeletal: no deformities, strength intact in all 4 Skin: moist, warm, no rashes Neurological: no tremor with outstretched hands, DTR normal in  all 4  ASSESSMENT: 1. DM2, insulin-dependent, uncontrolled, with complications - CAD - s/p STEMI 07/2012 - cerebrovascular disease - s/p CVA - CHF - nephropathy - neuropathy - DR  PLAN:  1. Patient with long-standing, uncontrolled diabetes, on basal-bolus insulin regimen, now with improved sugars to the point of hypoglycemia - I suggested the following changes in his insulin regimen:  Patient Instructions  - Please decrease Lantus to 25 units at bedtime  - Decrease Humalog to: 10 units with breakfast 12 units with lunch 12 units with dinner - Continue Humalog Sliding scale -  150-175: + 1 unit  - 176-200: + 2 units  - 201-225: + 3 units  - 226-250: + 4 units  - 251-275: + 5 units - >276: + 6 units !take 5 units less Humalog with the meal before exercise. If sugars <80 before a meal, please take ~60% of the mealtime insulin dose. If sugars <60 before a meal, please take ~30% of the mealtime insulin dose.  Check sugars between meals and at bedtime, too.  Please return in 1 month with your sugar log (03/08/24).   - For him, due to his age, comorbidities, the fact that he falls (unstable right knee - walks with a cane), and the fact that he does not have good hypoglycemia awareness, we need to absolutely avoid low blood sugars. - continue to check sugars at different times of the day and write them down - check 4 times a day, rotating checks - given new sugar logs  - refilled Lantus - Return to clinic in 1 mo with sugar log

## 2014-02-05 NOTE — Patient Instructions (Signed)
-  Please decrease Lantus to 25 units at bedtime  - Decrease Humalog to: 10 units with breakfast 12 units with lunch 12 units with dinner - Continue Humalog Sliding scale - 150-175: + 1 unit  - 176-200: + 2 units  - 201-225: + 3 units  - 226-250: + 4 units  - 251-275: + 5 units - >276: + 6 units !take 5 units less Humalog with the meal before exercise. If sugars <80 before a meal, please take ~60% of the mealtime insulin dose. If sugars <60 before a meal, please take ~30% of the mealtime insulin dose.  Check sugars between meals and at bedtime, too.  Please return in 1 month with your sugar log (03/08/24).

## 2014-02-05 NOTE — Addendum Note (Signed)
Addended by: Philemon Kingdom on: 02/05/2014 06:17 PM   Modules accepted: Orders

## 2014-02-05 NOTE — Telephone Encounter (Signed)
Yes, there was an error in the order. I fixed it and resent the Rx to [facility].

## 2014-02-12 ENCOUNTER — Other Ambulatory Visit (HOSPITAL_COMMUNITY): Payer: Self-pay | Admitting: Physician Assistant

## 2014-02-12 ENCOUNTER — Other Ambulatory Visit: Payer: Self-pay | Admitting: Cardiology

## 2014-02-13 ENCOUNTER — Ambulatory Visit (INDEPENDENT_AMBULATORY_CARE_PROVIDER_SITE_OTHER): Payer: Medicare Other

## 2014-02-13 DIAGNOSIS — E538 Deficiency of other specified B group vitamins: Secondary | ICD-10-CM | POA: Diagnosis not present

## 2014-02-13 MED ORDER — CYANOCOBALAMIN 1000 MCG/ML IJ SOLN
1000.0000 ug | Freq: Once | INTRAMUSCULAR | Status: AC
  Administered 2014-02-13: 1000 ug via INTRAMUSCULAR

## 2014-02-18 DIAGNOSIS — H35379 Puckering of macula, unspecified eye: Secondary | ICD-10-CM | POA: Diagnosis not present

## 2014-02-18 DIAGNOSIS — H35039 Hypertensive retinopathy, unspecified eye: Secondary | ICD-10-CM | POA: Diagnosis not present

## 2014-02-18 DIAGNOSIS — H43399 Other vitreous opacities, unspecified eye: Secondary | ICD-10-CM | POA: Diagnosis not present

## 2014-02-18 DIAGNOSIS — H524 Presbyopia: Secondary | ICD-10-CM | POA: Diagnosis not present

## 2014-02-18 DIAGNOSIS — H40019 Open angle with borderline findings, low risk, unspecified eye: Secondary | ICD-10-CM | POA: Diagnosis not present

## 2014-02-18 LAB — HM DIABETES EYE EXAM

## 2014-02-19 ENCOUNTER — Encounter: Payer: Self-pay | Admitting: Family Medicine

## 2014-02-19 ENCOUNTER — Other Ambulatory Visit: Payer: Self-pay | Admitting: Family Medicine

## 2014-02-19 DIAGNOSIS — E11319 Type 2 diabetes mellitus with unspecified diabetic retinopathy without macular edema: Secondary | ICD-10-CM | POA: Insufficient documentation

## 2014-03-06 ENCOUNTER — Other Ambulatory Visit (INDEPENDENT_AMBULATORY_CARE_PROVIDER_SITE_OTHER): Payer: Medicare Other

## 2014-03-06 DIAGNOSIS — E785 Hyperlipidemia, unspecified: Secondary | ICD-10-CM | POA: Diagnosis not present

## 2014-03-06 LAB — HEPATIC FUNCTION PANEL
ALT: 20 U/L (ref 0–53)
AST: 20 U/L (ref 0–37)
Albumin: 3.7 g/dL (ref 3.5–5.2)
Alkaline Phosphatase: 39 U/L (ref 39–117)
BILIRUBIN DIRECT: 0.2 mg/dL (ref 0.0–0.3)
BILIRUBIN TOTAL: 0.9 mg/dL (ref 0.3–1.2)
Total Protein: 7 g/dL (ref 6.0–8.3)

## 2014-03-06 LAB — BASIC METABOLIC PANEL
BUN: 13 mg/dL (ref 6–23)
CO2: 27 mEq/L (ref 19–32)
Calcium: 9 mg/dL (ref 8.4–10.5)
Chloride: 105 mEq/L (ref 96–112)
Creatinine, Ser: 1.6 mg/dL — ABNORMAL HIGH (ref 0.4–1.5)
GFR: 45.94 mL/min — AB (ref >60.00)
GLUCOSE: 118 mg/dL — AB (ref 70–99)
POTASSIUM: 4.1 meq/L (ref 3.5–5.1)
Sodium: 140 mEq/L (ref 135–145)

## 2014-03-06 LAB — LIPID PANEL
Cholesterol: 118 mg/dL (ref 0–200)
HDL: 45.9 mg/dL (ref >39.00)
LDL Cholesterol: 51 mg/dL (ref 0–99)
Total CHOL/HDL Ratio: 3
Triglycerides: 106 mg/dL (ref 0.0–149.0)
VLDL: 21.2 mg/dL (ref 0.0–40.0)

## 2014-03-06 NOTE — Progress Notes (Signed)
Quick Note:  Please make copy of labs for patient visit. ______

## 2014-03-10 ENCOUNTER — Ambulatory Visit (INDEPENDENT_AMBULATORY_CARE_PROVIDER_SITE_OTHER): Payer: Medicare Other | Admitting: Cardiology

## 2014-03-10 ENCOUNTER — Encounter: Payer: Self-pay | Admitting: Cardiology

## 2014-03-10 VITALS — BP 144/60 | HR 58 | Ht 71.0 in | Wt 208.0 lb

## 2014-03-10 DIAGNOSIS — I213 ST elevation (STEMI) myocardial infarction of unspecified site: Secondary | ICD-10-CM

## 2014-03-10 DIAGNOSIS — E785 Hyperlipidemia, unspecified: Secondary | ICD-10-CM | POA: Diagnosis not present

## 2014-03-10 DIAGNOSIS — I119 Hypertensive heart disease without heart failure: Secondary | ICD-10-CM | POA: Diagnosis not present

## 2014-03-10 DIAGNOSIS — I2510 Atherosclerotic heart disease of native coronary artery without angina pectoris: Secondary | ICD-10-CM | POA: Diagnosis not present

## 2014-03-10 DIAGNOSIS — B0229 Other postherpetic nervous system involvement: Secondary | ICD-10-CM | POA: Diagnosis not present

## 2014-03-10 DIAGNOSIS — I219 Acute myocardial infarction, unspecified: Secondary | ICD-10-CM | POA: Diagnosis not present

## 2014-03-10 DIAGNOSIS — I5023 Acute on chronic systolic (congestive) heart failure: Secondary | ICD-10-CM

## 2014-03-10 DIAGNOSIS — I509 Heart failure, unspecified: Secondary | ICD-10-CM

## 2014-03-10 MED ORDER — TICAGRELOR 90 MG PO TABS: ORAL_TABLET | ORAL | Status: DC

## 2014-03-10 NOTE — Progress Notes (Signed)
[name] Date of Birth:  1930-12-10 [address] [place], [place]  [address] [phone]         Fax   [phone]  History of Present Illness: Mr. Neuser is seen back today for a scheduled followup office visit. On 08/06/2012 the patient had a STEMI with DES to the mid LAD. He is on Brilinta and a baby aspirin daily. His other issues include CKD, HTN, DM, HLD, OA, urinary incontinence, glaucoma, melanoma and thrombocytopenia.  Since last visit he has been doing well. No recurrent angina.  The patient has a history of chronic right-sided chest discomfort secondary to postherpetic neuralgia.   He had his episode of shingles about 16 months ago.  Since last visit he has not been experiencing any definite recurrent angina pectoris.  He is diabetic.  He has been having occasional hypoglycemic episodes.  Current Outpatient Prescriptions  Medication Sig Dispense Refill  . acetaminophen (TYLENOL) 500 MG tablet Take 1 tablet (500 mg total) by mouth every 6 (six) hours as needed for pain.      [name] Kitchen amLODipine (NORVASC) 5 MG tablet TAKE 1 TABLET BY MOUTH EVERY DAY  90 tablet  0  . aspirin 81 MG tablet Take 1 tablet (81 mg total) by mouth daily.  30 tablet    . CRESTOR 40 MG tablet TAKE 1 TABLET EVERY DAY  30 tablet  0  . cyanocobalamin (,VITAMIN B-12,) 1000 MCG/ML injection Inject 1 mL (1,000 mcg total) into the muscle every 30 (thirty) days.      [name] Kitchen desonide (DESOWEN) 0.05 % ointment       . glucose blood (ONE TOUCH ULTRA TEST) test strip Check four times daily, 250.42  120 each  11  . insulin glargine (LANTUS) 100 UNIT/ML injection Inject 0.25 mLs (25 Units total) into the skin at bedtime.  30 mL  3  . insulin lispro (HUMALOG) 100 UNIT/ML injection Inject 15 units subcutaneously with lunch and dinner, plus sliding scale correction  10 mL    . Insulin Pen Needle 31G X 8 MM MISC Use as directed with Lantus solostar. Dx 250.62.  100 each  5  . Insulin Syringe-Needle U-100 (BD  INSULIN SYRINGE ULTRAFINE) 31G X 5/16" 0.5 ML MISC Inject 4x a day  400 each  3  . isosorbide mononitrate (IMDUR) 30 MG 24 hr tablet Take 1 tablet (30 mg total) by mouth daily.  60 tablet  6  . nitroGLYCERIN (NITROSTAT) 0.4 MG SL tablet Place 1 tablet (0.4 mg total) under the tongue every 5 (five) minutes as needed for chest pain. Up to three doses,if pain continues call 911  25 tablet  3  . tamsulosin (FLOMAX) 0.4 MG CAPS capsule TAKE 1 CAPSULE BY MOUTH DAILY.  90 capsule  0  . Ticagrelor (BRILINTA) 90 MG TABS tablet TAKE 1 TABLET (90 MG TOTAL) BY MOUTH 2 (TWO) TIMES DAILY.  180 tablet  3   No current facility-administered medications for this visit.    Allergies  Allergen Reactions  . Januvia [Sitagliptin Phosphate] Other (See Comments)    Possibly affected kidneys?  . Metformin And Related Other (See Comments)    Affected kidneys  . Oxycodone     Severe confusion  . Penicillins Other (See Comments)    Reaction long time ago - doesn't remember    Patient Active Problem List   Diagnosis Date Noted  . Diabetes mellitus type 2 with retinopathy 02/19/2014  . Rash of face 01/03/2014  .  Shoulder injury 07/10/2013  . Right knee pain 07/10/2013  . Memory loss 04/16/2013  . Syncope 04/02/2013  . Post herpetic neuralgia 02/26/2013  . Unstable angina 10/15/2012  . CAD (coronary artery disease) 10/15/2012  . Acute on chronic systolic CHF (congestive heart failure) 10/15/2012  . DM type 2 causing CKD stage 3 10/15/2012  . Benign hypertension with CKD (chronic kidney disease) stage III 10/15/2012  . Thrombocytopenia   . STEMI (ST elevation myocardial infarction) 08/06/2012  . Chest pain 11/11/2011  . Osteoarthritis   . CRI (chronic renal insufficiency)   . Urinary incontinence   . Type 2 diabetes, uncontrolled, with neuropathy 07/18/2011  . Hypercholesterolemia 07/18/2011    History  Smoking status  . Never Smoker   Smokeless tobacco  . Never Used    History  Alcohol Use No      Family History  Problem Relation Age of Onset  . Stroke Mother     hemorrhage  . Diabetes Mother   . Cancer Father     lung  . Diabetes Brother     Review of Systems: Constitutional: no fever chills diaphoresis or fatigue or change in weight.  Head and neck: no hearing loss, no epistaxis, no photophobia or visual disturbance. Respiratory: No cough, shortness of breath or wheezing. Cardiovascular: No chest pain peripheral edema, palpitations. Gastrointestinal: No abdominal distention, no abdominal pain, no change in bowel habits hematochezia or melena. Genitourinary: No dysuria, no frequency, no urgency, no nocturia. Musculoskeletal:No arthralgias, no back pain, no gait disturbance or myalgias. Neurological: No dizziness, no headaches, no numbness, no seizures, no syncope, no weakness, no tremors. Hematologic: No lymphadenopathy, no easy bruising. Psychiatric: No confusion, no hallucinations, no sleep disturbance.    Physical Exam: Filed Vitals:   03/10/14 1516  BP: 144/60  Pulse: 58   the general appearance reveals a well-developed well-nourished gentleman in no distress.The head and neck exam reveals pupils equal and reactive.  Extraocular movements are full.  There is no scleral icterus.  The mouth and pharynx are normal.  The neck is supple.  The carotids reveal no bruits.  The jugular venous pressure is normal.  The  thyroid is not enlarged.  There is no lymphadenopathy.  The chest is clear to percussion and auscultation.  There are no rales or rhonchi.  Expansion of the chest is symmetrical.  The precordium is quiet.  The first heart sound is normal.  The second heart sound is physiologically split.  There is no murmur gallop rub or click.  There is no abnormal lift or heave.  The abdomen is soft and nontender.  The bowel sounds are normal.  The liver and spleen are not enlarged.  There are no abdominal masses.  There are no abdominal bruits.  Extremities reveal good pedal  pulses.  There is mild pretibial edema.  There is no cyanosis or clubbing.  Strength is normal and symmetrical in all extremities.  There is no lateralizing weakness.  There are no sensory deficits.  The skin is warm and dry.  There is no rash.     Assessment / Plan: Continue same medication.  Recheck in 6 months for followup office visit EKG fasting lipid panel hepatic function panel and basal metabolic panel.  He is now seeing a diabetes specialist regarding his diabetes.

## 2014-03-10 NOTE — Assessment & Plan Note (Signed)
The patient has not been experiencing any recurrent chest pain or angina.  He remains on dual antiplatelet therapy

## 2014-03-10 NOTE — Assessment & Plan Note (Signed)
The patient is still troubled with significant postherpetic neuralgia under the right axillary area.  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  He also had had right shoulder surgery more than 20 years ago. He previously had been on gabapentin for the postherpetic neuralgia but did not think it was helping.

## 2014-03-10 NOTE — Assessment & Plan Note (Signed)
The patient has not been having any symptoms of CHF.  No orthopnea or paroxysmal nocturnal dyspnea.

## 2014-03-10 NOTE — Patient Instructions (Signed)
Your physician recommends that you continue on your current medications as directed. Please refer to the Current Medication list given to you today.  Your physician wants you to follow-up in: 6 months with fasting labs (lp/bmet/hfp) and EKG  You will receive a reminder letter in the mail two months in advance. If you don't receive a letter, please call our office to schedule the follow-up appointment.

## 2014-03-12 ENCOUNTER — Ambulatory Visit (INDEPENDENT_AMBULATORY_CARE_PROVIDER_SITE_OTHER): Payer: Medicare Other | Admitting: Internal Medicine

## 2014-03-12 ENCOUNTER — Encounter: Payer: Self-pay | Admitting: Internal Medicine

## 2014-03-12 VITALS — BP 126/64 | HR 67 | Temp 97.7°F | Resp 12 | Wt 204.0 lb

## 2014-03-12 DIAGNOSIS — IMO0002 Reserved for concepts with insufficient information to code with codable children: Secondary | ICD-10-CM

## 2014-03-12 DIAGNOSIS — E1142 Type 2 diabetes mellitus with diabetic polyneuropathy: Secondary | ICD-10-CM

## 2014-03-12 DIAGNOSIS — E1140 Type 2 diabetes mellitus with diabetic neuropathy, unspecified: Secondary | ICD-10-CM

## 2014-03-12 DIAGNOSIS — E1165 Type 2 diabetes mellitus with hyperglycemia: Principal | ICD-10-CM

## 2014-03-12 DIAGNOSIS — I2510 Atherosclerotic heart disease of native coronary artery without angina pectoris: Secondary | ICD-10-CM | POA: Diagnosis not present

## 2014-03-12 DIAGNOSIS — E1149 Type 2 diabetes mellitus with other diabetic neurological complication: Secondary | ICD-10-CM | POA: Diagnosis not present

## 2014-03-12 MED ORDER — INSULIN LISPRO 100 UNIT/ML ~~LOC~~ SOLN: SUBCUTANEOUS | Status: DC

## 2014-03-12 NOTE — Progress Notes (Signed)
Patient ID: Frank Simpson, male   DOB: [birth year], 80 y.o.   MRN: [id number]  HPI: Frank Simpson is a 80 y.o.-year-old male, returning for f/u for DM2, dx 1980s, insulin-dependent since 2013, uncontrolled, with complications (CAD - s/p STEMI, cerebrovascular ds. - s/p CVA, CHF, nephropathy, neuropathy, DR). He is here with his wife who offers most of the history. Last visit 1 mo ago.  Last hemoglobin A1c was: Lab Results  Component Value Date   HGBA1C 7.2* 01/09/2014   HGBA1C 9.0* 10/09/2013   HGBA1C 9.1* 04/17/2013   Pt is now on: - Lantus 45 >> 30 >> 25 units at bedtime  - Humalog 09-09-11 units with B-L-D - Humalog Sliding scale: - 150-175: + 1 unit  - 176-200: + 2 units  - 201-225: + 3 units  - 226-250: + 4 units  - 251-275: + 5 units - >276: + 6 units !take 5 units less Humalog with the meal before exercise. If sugars <80 before a meal, please take ~60% of the mealtime insulin dose. If sugars <60 before a meal, please take ~30% of the mealtime insulin dose.  Pt checks his sugars 3-4 a day and sugars are better but he also developed more lows - brings detailed log, which we reviewed together: - am: 50s-225 >> 54-216, but most 80-120s >> 47, 59, 64, 68, 76-130 >> 56x1, 80-140 - 2h after b'fast: n/c >> 48 (after the 63), 136-280 >> 67,120 >> n/c - before lunch: 200s >> 56-225, most 100-150s >> 46, 49 - 257, most 70-150 >> 50, 56, 80-298, 305 (forgot insulin) - 2h after lunch: n/c >> 85 (one check) >> 38, 154, 173 >> n/c - before dinner: 200-400s >> 79-280 (most 170-low 200s) >> 80-256 >> 69-241 - 2h after dinner: n/c - bedtime: n/c - nighttime: n/c Has lows: lowest 35 x 1  - he has hypoglycemia awareness at 60.   - He has CKD stage 3, last BUN/creatinine:  Lab Results  Component Value Date   BUN 13 03/06/2014   CREATININE 1.6* 03/06/2014   - Pt has HL - last set of lipids: Lab Results  Component Value Date   CHOL 118 03/06/2014   HDL 45.90 03/06/2014   LDLCALC 51 03/06/2014   LDLDIRECT 72 11/17/2010   TRIG 106.0 03/06/2014   CHOLHDL 3 03/06/2014  On Crestor 40. - last eye exam was last week. + DR.  - no numbness and tingling in his feet.  I reviewed pt's medications, allergies, PMH, social hx, family hx and no changes required, except as mentioned above.  ROS: Constitutional: no weight gain/loss, no fatigue, no subjective hyperthermia/hypothermia Eyes: no blurry vision, no xerophthalmia ENT: no sore throat, no nodules palpated in throat, no dysphagia/odynophagia, no hoarseness, + decreased hearing Cardiovascular: no CP/no SOB/palpitations/leg swelling Respiratory: no cough/SOB Gastrointestinal: no N/V/D/C Musculoskeletal: + muscle/+ joint aches Skin: no rashes  PE: BP 126/64  Pulse 67  Temp(Src) 97.7 F (36.5 C) (Oral)  Resp 12  Wt 204 lb (92.534 kg)  SpO2 98% Wt Readings from Last 3 Encounters:  03/12/14 204 lb (92.534 kg)  03/10/14 208 lb (94.348 kg)  02/05/14 207 lb (93.895 kg)  There is no weight on file to calculate BMI.  Constitutional: overweight, in NAD Eyes: PERRLA, EOMI, no exophthalmos ENT: moist mucous membranes, no thyromegaly, no cervical lymphadenopathy Cardiovascular: RRR, No MRG Respiratory: CTA B Gastrointestinal: abdomen soft, NT, ND, BS+ Musculoskeletal: no deformities, strength intact in all 4 Skin: moist, warm, no rashes Neurological:  no tremor with outstretched hands, DTR normal in all 4  ASSESSMENT: 1. DM2, insulin-dependent, uncontrolled, with complications - CAD - s/p STEMI 07/2012 - cerebrovascular disease - s/p CVA - CHF - nephropathy - neuropathy - DR  PLAN:  1. Patient with long-standing, uncontrolled diabetes, on basal-bolus insulin regimen, now with improved sugars. At last visit, he started to develop hypoglycemia >> we reduced Lantus and b'fast Humalog dose. Still has some lows, but fewer. - I suggested the following changes in his insulin regimen:  Patient Instructions  - Please continue Lantus 25  units at bedtime  - Decrease Humalog to: 8 units with breakfast 10 units with lunch 12 units with dinner Please decrease the doses to half if sugars are <90 or if you plan to work out in the garden that morning - Continue Humalog Sliding scale - 150-175: + 1 unit  - 176-200: + 2 units  - 201-225: + 3 units  - 226-250: + 4 units  - 251-275: + 5 units - >276: + 6 units  Please separate breakfast from lunch by at least 4h (ideally ~5h) - now only 3h >> can lead to insulin stacking and hypoglycemia!  Check sugars between meals and at bedtime, too.  Please return in 3 month with your sugar log.  - For him, due to his age, comorbidities, the fact that he falls (unstable right knee - walks with a cane), and the fact that he does not have good hypoglycemia awareness, we need to absolutely avoid low blood sugars. - continue to check sugars at different times of the day and write them down - check 4 times a day, rotating checks - given new sugar logs  - Return to clinic in 3 mo with sugar log

## 2014-03-12 NOTE — Patient Instructions (Addendum)
-  Please continue Lantus 25 units at bedtime  - Decrease Humalog to: 8 units with breakfast 10 units with lunch 12 units with dinner Please decrease the doses to half if sugars are <90 or if you plan to work out in the garden that morning - Continue Humalog Sliding scale - 150-175: + 1 unit  - 176-200: + 2 units  - 201-225: + 3 units  - 226-250: + 4 units  - 251-275: + 5 units - >276: + 6 units  Please separate breakfast from lunch by at least 4h (ideally ~5h).  Check sugars between meals and at bedtime, too.  Please return in 3 month with your sugar log.

## 2014-03-13 ENCOUNTER — Encounter (INDEPENDENT_AMBULATORY_CARE_PROVIDER_SITE_OTHER): Payer: Medicare Other | Admitting: Ophthalmology

## 2014-03-13 DIAGNOSIS — E1139 Type 2 diabetes mellitus with other diabetic ophthalmic complication: Secondary | ICD-10-CM

## 2014-03-13 DIAGNOSIS — E11319 Type 2 diabetes mellitus with unspecified diabetic retinopathy without macular edema: Secondary | ICD-10-CM | POA: Diagnosis not present

## 2014-03-13 DIAGNOSIS — H35379 Puckering of macula, unspecified eye: Secondary | ICD-10-CM | POA: Diagnosis not present

## 2014-03-13 DIAGNOSIS — E1165 Type 2 diabetes mellitus with hyperglycemia: Secondary | ICD-10-CM | POA: Diagnosis not present

## 2014-03-13 DIAGNOSIS — I10 Essential (primary) hypertension: Secondary | ICD-10-CM

## 2014-03-13 DIAGNOSIS — H35039 Hypertensive retinopathy, unspecified eye: Secondary | ICD-10-CM

## 2014-03-13 DIAGNOSIS — H35359 Cystoid macular degeneration, unspecified eye: Secondary | ICD-10-CM | POA: Diagnosis not present

## 2014-03-13 DIAGNOSIS — H43819 Vitreous degeneration, unspecified eye: Secondary | ICD-10-CM

## 2014-03-19 ENCOUNTER — Ambulatory Visit (INDEPENDENT_AMBULATORY_CARE_PROVIDER_SITE_OTHER): Payer: Medicare Other | Admitting: *Deleted

## 2014-03-19 DIAGNOSIS — E538 Deficiency of other specified B group vitamins: Secondary | ICD-10-CM | POA: Diagnosis not present

## 2014-03-19 MED ORDER — CYANOCOBALAMIN 1000 MCG/ML IJ SOLN
1000.0000 ug | Freq: Once | INTRAMUSCULAR | Status: AC
  Administered 2014-03-19: 1000 ug via INTRAMUSCULAR

## 2014-03-22 ENCOUNTER — Other Ambulatory Visit: Payer: Self-pay | Admitting: Cardiology

## 2014-03-31 ENCOUNTER — Other Ambulatory Visit: Payer: Self-pay | Admitting: Cardiology

## 2014-04-23 ENCOUNTER — Encounter: Payer: Self-pay | Admitting: Family Medicine

## 2014-04-23 ENCOUNTER — Ambulatory Visit (INDEPENDENT_AMBULATORY_CARE_PROVIDER_SITE_OTHER): Payer: Medicare Other | Admitting: Family Medicine

## 2014-04-23 ENCOUNTER — Telehealth: Payer: Self-pay

## 2014-04-23 ENCOUNTER — Ambulatory Visit: Payer: Medicare Other

## 2014-04-23 VITALS — BP 138/72 | HR 65 | Temp 98.2°F

## 2014-04-23 DIAGNOSIS — W19XXXA Unspecified fall, initial encounter: Secondary | ICD-10-CM

## 2014-04-23 DIAGNOSIS — E119 Type 2 diabetes mellitus without complications: Secondary | ICD-10-CM | POA: Diagnosis not present

## 2014-04-23 DIAGNOSIS — E538 Deficiency of other specified B group vitamins: Secondary | ICD-10-CM

## 2014-04-23 DIAGNOSIS — I2510 Atherosclerotic heart disease of native coronary artery without angina pectoris: Secondary | ICD-10-CM

## 2014-04-23 DIAGNOSIS — R296 Repeated falls: Secondary | ICD-10-CM | POA: Insufficient documentation

## 2014-04-23 LAB — GLUCOSE, POCT (MANUAL RESULT ENTRY): POC Glucose: 237 mg/dl — AB (ref 70–99)

## 2014-04-23 MED ORDER — CYANOCOBALAMIN 1000 MCG/ML IJ SOLN: 1000.0000 ug | INTRAMUSCULAR | Status: DC

## 2014-04-23 MED ORDER — CYANOCOBALAMIN 1000 MCG/ML IJ SOLN
1000.0000 ug | Freq: Once | INTRAMUSCULAR | Status: AC
  Administered 2014-04-23: 1000 ug via INTRAMUSCULAR

## 2014-04-23 NOTE — Assessment & Plan Note (Signed)
Mechanical fall in paring lot today - with resultant abrasion of R knee and small lacs on hands - not amenable to suturing. Cleaned areas then dressed with abx ointment and bandaids, discussed home dressing changes. No concerns for intracranial issue - no significant facial injury noted today. Check cbg today to eval for hypoglycemia - actually elevated.

## 2014-04-23 NOTE — Progress Notes (Signed)
BP 138/72  Pulse 65  Temp(Src) 98.2 F (36.8 C) (Oral)  SpO2 97%   CC: fall  Subjective:    Patient ID: Frank Simpson, Frank Simpson    DOB: 1931/05/31, 80 y.o.   MRN: [id number]  HPI: Frank Simpson is a 80 y.o. Frank Simpson presenting on 04/23/2014 for Fall   Drove here alone today. Pt presents today as walk in after mechanical fall suffered in our parking lot - pt thinks he missed the curb.  Hands took brunt of fall, but may also have hit nose on pavement.  Denies persistent arm pain, headache or facial pain.  Denies premonitory sxs like dizziness prior to fall.  Has had several falls recently.  Was coming in for B12 shot today which we will give today. On blood thinner - aspirin 70m daily and brillinta bid.  Relevant past medical, surgical, family and social history reviewed and updated as indicated.  Allergies and medications reviewed and updated. Current Outpatient Prescriptions on File Prior to Visit  Medication Sig  . acetaminophen (TYLENOL) 500 MG tablet Take 1 tablet (500 mg total) by mouth every 6 (six) hours as needed for pain.  .[name] KitchenamLODipine (NORVASC) 5 MG tablet TAKE 1 TABLET BY MOUTH EVERY DAY  . aspirin 81 MG tablet Take 1 tablet (81 mg total) by mouth daily.  . CRESTOR 40 MG tablet TAKE 1 TABLET EVERY DAY  . desonide (DESOWEN) 0.05 % ointment   . glucose blood (ONE TOUCH ULTRA TEST) test strip Check four times daily, 250.42  . insulin glargine (LANTUS) 100 UNIT/ML injection Inject 0.25 mLs (25 Units total) into the skin at bedtime.  . insulin lispro (HUMALOG) 100 UNIT/ML injection Inject 30 units subcutaneously as advised  . Insulin Pen Needle 31G X 8 MM MISC Use as directed with Lantus solostar. Dx 250.62.  .[name] KitchenInsulin Syringe-Needle U-100 (BD INSULIN SYRINGE ULTRAFINE) 31G X 5/16" 0.5 ML MISC Inject 4x a day  . isosorbide mononitrate (IMDUR) 30 MG 24 hr tablet Take 1 tablet (30 mg total) by mouth daily.  . nitroGLYCERIN (NITROSTAT) 0.4 MG SL tablet Place 1 tablet (0.4 mg  total) under the tongue every 5 (five) minutes as needed for chest pain. Up to three doses,if pain continues call 911  . tamsulosin (FLOMAX) 0.4 MG CAPS capsule TAKE 1 CAPSULE BY MOUTH DAILY.  . Ticagrelor (BRILINTA) 90 MG TABS tablet TAKE 1 TABLET (90 MG TOTAL) BY MOUTH 2 (TWO) TIMES DAILY.   No current facility-administered medications on file prior to visit.    Review of Systems Per HPI unless specifically indicated above    Objective:    BP 138/72  Pulse 65  Temp(Src) 98.2 F (36.8 C) (Oral)  SpO2 97%  Physical Exam  Nursing note and vitals reviewed. Constitutional: He appears well-developed and well-nourished. No distress.  HENT:  Head: Normocephalic and atraumatic.  Mouth/Throat: Oropharynx is clear and moist. No oropharyngeal exudate.  No jaw pain, no TMJ pain, no facial painn  Eyes: EOM are normal. Pupils are equal, round, and reactive to light. No scleral icterus.  Musculoskeletal: He exhibits no edema.  Neurological: He has normal strength. No cranial nerve deficit or sensory deficit.  CN 2- 12 intact No nystagmus. Intact grip strength  Skin: Skin is warm and dry.  Small 1cm lacerations right medial lateral palm and left lateral thenar palm . R anterior knee abrasion   Results for orders placed in visit on 04/23/14  GLUCOSE, POCT (MANUAL RESULT ENTRY)  Result Value Ref Range   POC Glucose 237 (*) 70 - 99 mg/dl      Assessment & Plan:   Problem List Items Addressed This Visit   Fall - Primary     Mechanical fall in paring lot today - with resultant abrasion of R knee and small lacs on hands - not amenable to suturing. Cleaned areas then dressed with abx ointment and bandaids, discussed home dressing changes. No concerns for intracranial issue - no significant facial injury noted today. Check cbg today to eval for hypoglycemia - actually elevated.      Other Visit Diagnoses   Diabetes        Relevant Orders       POCT glucose (manual entry) (Completed)     Vitamin B12 deficiency        Relevant Medications       cyanocobalamin (,VITAMIN B-12,) 1000 MCG/ML injection       cyanocobalamin ((VITAMIN B-12)) injection 1,000 mcg (Completed)        Follow up plan: Return if symptoms worsen or fail to improve.

## 2014-04-23 NOTE — Telephone Encounter (Addendum)
Pt stated he was walking up the sidewalk at our office and he guessed he walked off the sidewalk and fell with skin abrasions to both hands and pt said he hit his head, do not see lump or cut on head. Another pt helped pt up and pt walked in our office. Pt was assisted to bathroom by Ebony Hail, front office registration. Hands were cleansed and pt given 4x4; bleeding appears to have stopped. Pt said he did not have any pain any where; no H/A or dizziness but when pt stood and started to walk he almost stumbled again; pt did not fall; assisted pt to nearest chair and got w/c for pt. Dr Darnell Level will see pt this morning. Pt not sure when had last tetanus shot. BP reg cuff lt arm 138/72; T 98.2 P 65 and pulse ox 97%.

## 2014-04-23 NOTE — Telephone Encounter (Signed)
Seen today.

## 2014-04-23 NOTE — Patient Instructions (Signed)
B12 shot today. cbg check today. You had a fall today and hit your hands and right knee and slightly hit your face. Please let me know if any headache or facial pain after fall, but I think you should heal ok. We will dress wounds with antibiotic ointment - change dressing daily. Call us with questions.

## 2014-05-01 IMAGING — CT CT HEAD W/O CM
1 of 2 series · 13 of 30 positions shown, 17 images · non-contrast
Comparison: None.

CLINICAL DATA: Confusion post catheterization.  Diabetic
hypertensive patient.  Chronic renal insufficiency.

CT HEAD WITHOUT CONTRAST
TECHNIQUE: Contiguous axial images were obtained from the base of
the skull through the vertex without contrast.

[Series 2: brain · axial · 0.47mm/px · z∈[+137,+268]mm · 13 of 32 slices shown, 17 images]
[im 3/32  brain]
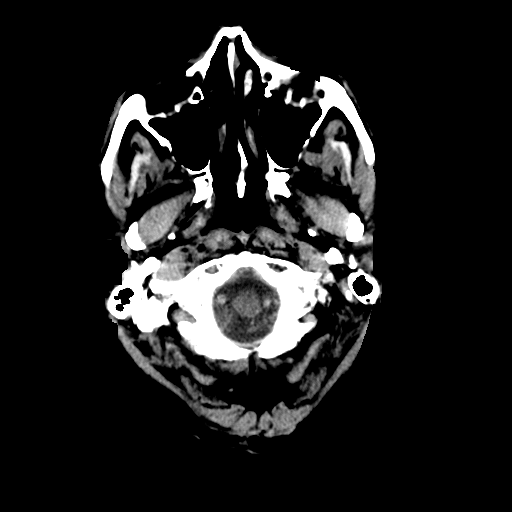
[im 3/32  bone]
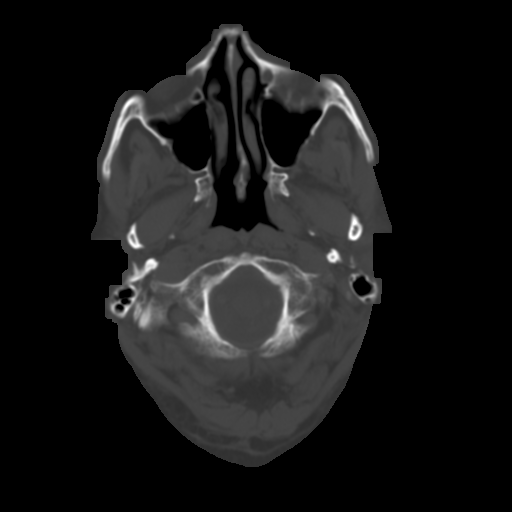
[im 5/32  brain]
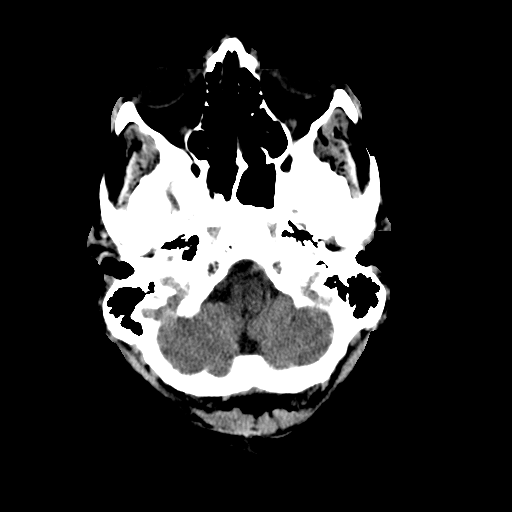
[im 7/32  brain]
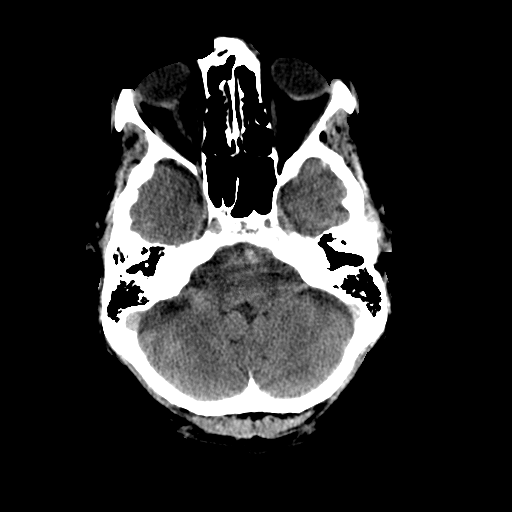
[im 9/32  brain]
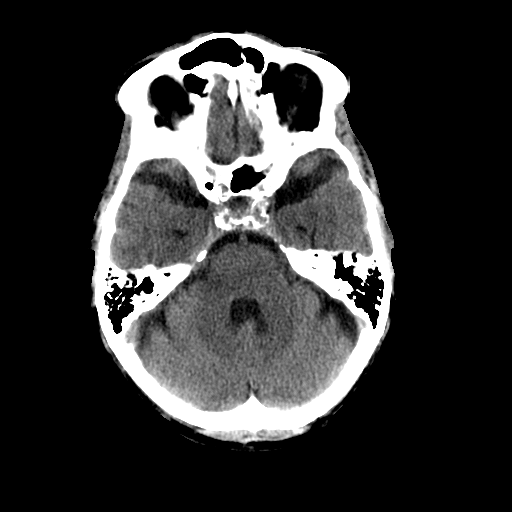
[im 12/32  brain]
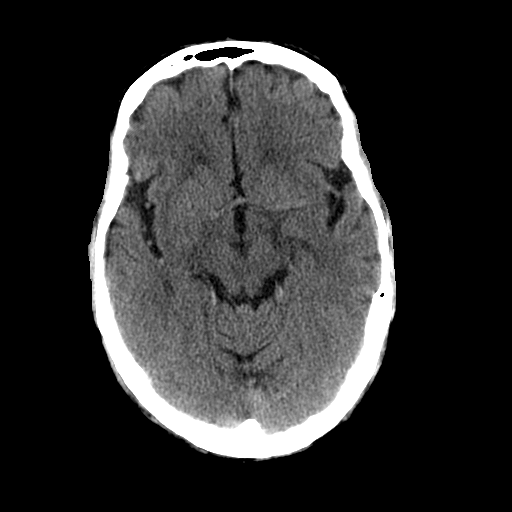
[im 12/32  bone]
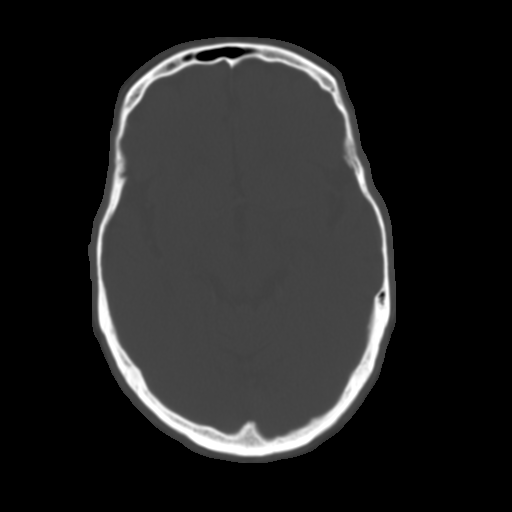
[im 14/32  brain]
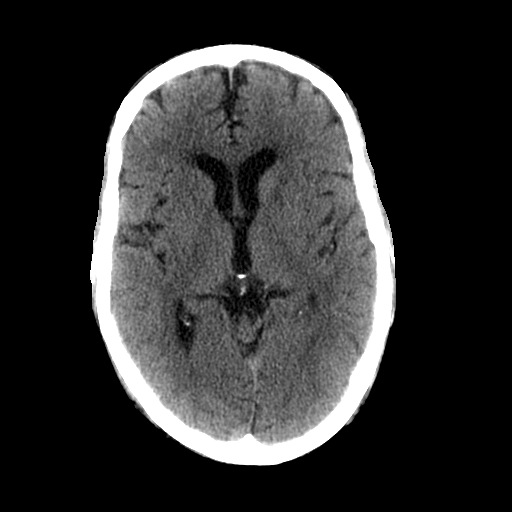
[im 16/32  brain]
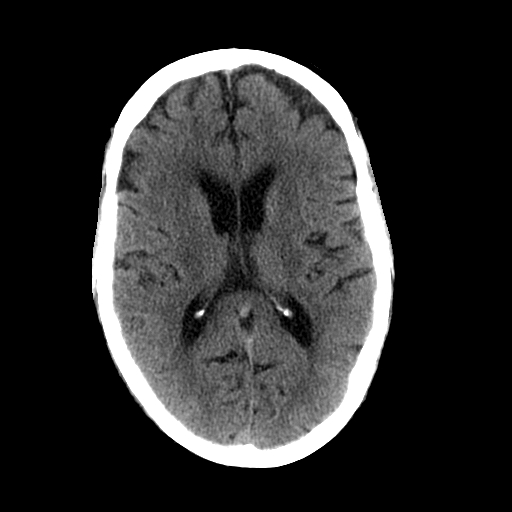
[im 18/32  brain]
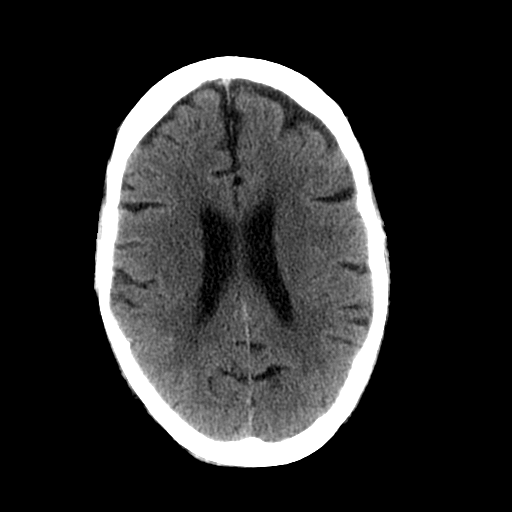
[im 20/32  brain]
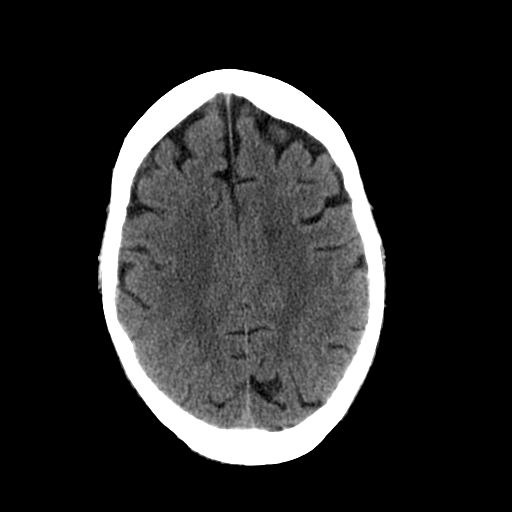
[im 20/32  bone]
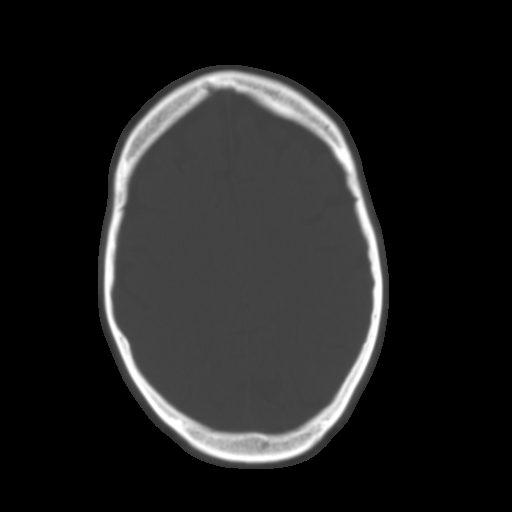
[im 23/32  brain]
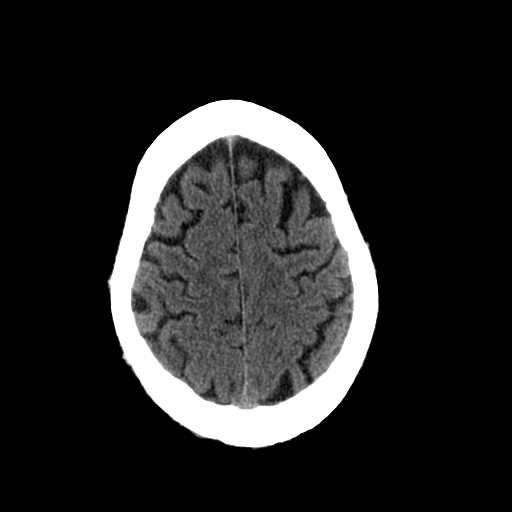
[im 25/32  brain]
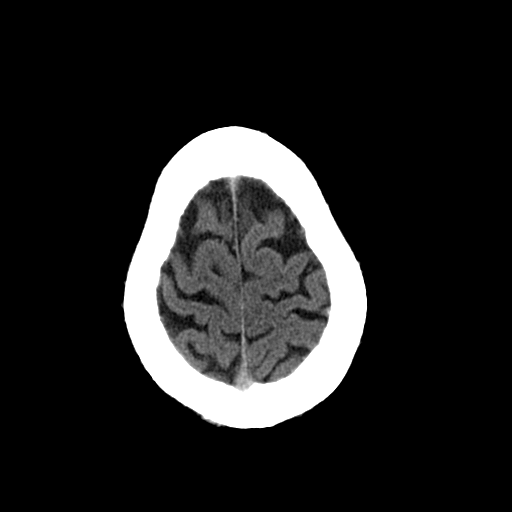
[im 27/32  brain]
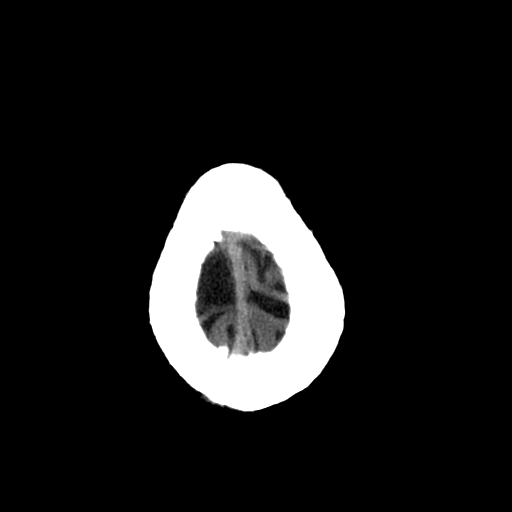
[im 29/32  brain]
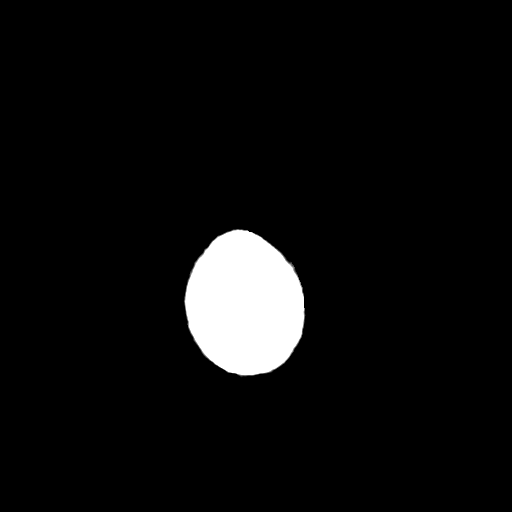
[im 29/32  bone]
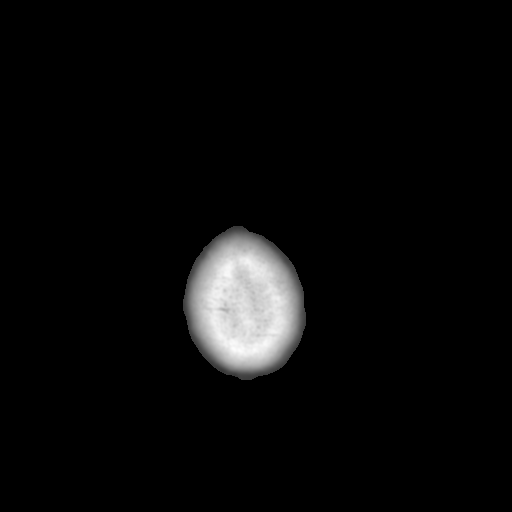

[13 of 30 positions shown; findings below may reference images not displayed]

FINDINGS: No intracranial hemorrhage.

Small vessel disease type changes.  Infarct/ischemia anterior limb
of the left internal capsule, age indeterminate.

No hydrocephalus.

Vascular calcifications.

No intracranial mass lesion detected on this unenhanced exam..

Minimal paranasal sinus mucosal thickening.
IMPRESSION: No intracranial hemorrhage.

Small vessel disease type changes.  Infarct/ischemia anterior limb
of the left internal capsule, age indeterminate.

## 2014-05-14 ENCOUNTER — Encounter (INDEPENDENT_AMBULATORY_CARE_PROVIDER_SITE_OTHER): Payer: Medicare Other | Admitting: Ophthalmology

## 2014-05-14 DIAGNOSIS — I10 Essential (primary) hypertension: Secondary | ICD-10-CM

## 2014-05-14 DIAGNOSIS — E1139 Type 2 diabetes mellitus with other diabetic ophthalmic complication: Secondary | ICD-10-CM

## 2014-05-14 DIAGNOSIS — E1165 Type 2 diabetes mellitus with hyperglycemia: Secondary | ICD-10-CM

## 2014-05-14 DIAGNOSIS — H35039 Hypertensive retinopathy, unspecified eye: Secondary | ICD-10-CM

## 2014-05-14 DIAGNOSIS — H35379 Puckering of macula, unspecified eye: Secondary | ICD-10-CM | POA: Diagnosis not present

## 2014-05-14 DIAGNOSIS — H35359 Cystoid macular degeneration, unspecified eye: Secondary | ICD-10-CM

## 2014-05-14 DIAGNOSIS — E11319 Type 2 diabetes mellitus with unspecified diabetic retinopathy without macular edema: Secondary | ICD-10-CM

## 2014-05-15 DIAGNOSIS — L570 Actinic keratosis: Secondary | ICD-10-CM | POA: Diagnosis not present

## 2014-05-15 DIAGNOSIS — L821 Other seborrheic keratosis: Secondary | ICD-10-CM | POA: Diagnosis not present

## 2014-05-15 DIAGNOSIS — D239 Other benign neoplasm of skin, unspecified: Secondary | ICD-10-CM | POA: Diagnosis not present

## 2014-05-15 DIAGNOSIS — C44611 Basal cell carcinoma of skin of unspecified upper limb, including shoulder: Secondary | ICD-10-CM | POA: Diagnosis not present

## 2014-05-15 DIAGNOSIS — Z85828 Personal history of other malignant neoplasm of skin: Secondary | ICD-10-CM | POA: Diagnosis not present

## 2014-05-15 DIAGNOSIS — L678 Other hair color and hair shaft abnormalities: Secondary | ICD-10-CM | POA: Diagnosis not present

## 2014-05-15 DIAGNOSIS — L738 Other specified follicular disorders: Secondary | ICD-10-CM | POA: Diagnosis not present

## 2014-05-15 DIAGNOSIS — D485 Neoplasm of uncertain behavior of skin: Secondary | ICD-10-CM | POA: Diagnosis not present

## 2014-05-15 DIAGNOSIS — D2320 Other benign neoplasm of skin of unspecified ear and external auricular canal: Secondary | ICD-10-CM | POA: Diagnosis not present

## 2014-05-21 ENCOUNTER — Other Ambulatory Visit: Payer: Self-pay | Admitting: Family Medicine

## 2014-05-26 ENCOUNTER — Encounter: Payer: Self-pay | Admitting: Family Medicine

## 2014-06-02 ENCOUNTER — Encounter: Payer: Self-pay | Admitting: Family Medicine

## 2014-06-11 ENCOUNTER — Telehealth: Payer: Self-pay | Admitting: Family Medicine

## 2014-06-11 ENCOUNTER — Ambulatory Visit (INDEPENDENT_AMBULATORY_CARE_PROVIDER_SITE_OTHER): Payer: Medicare Other | Admitting: Family Medicine

## 2014-06-11 ENCOUNTER — Encounter: Payer: Self-pay | Admitting: Family Medicine

## 2014-06-11 VITALS — BP 132/84 | HR 68 | Temp 98.2°F | Wt 200.5 lb

## 2014-06-11 DIAGNOSIS — N183 Chronic kidney disease, stage 3 unspecified: Secondary | ICD-10-CM

## 2014-06-11 DIAGNOSIS — E1149 Type 2 diabetes mellitus with other diabetic neurological complication: Secondary | ICD-10-CM

## 2014-06-11 DIAGNOSIS — IMO0002 Reserved for concepts with insufficient information to code with codable children: Secondary | ICD-10-CM

## 2014-06-11 DIAGNOSIS — W19XXXD Unspecified fall, subsequent encounter: Secondary | ICD-10-CM

## 2014-06-11 DIAGNOSIS — E1140 Type 2 diabetes mellitus with diabetic neuropathy, unspecified: Secondary | ICD-10-CM

## 2014-06-11 DIAGNOSIS — E1139 Type 2 diabetes mellitus with other diabetic ophthalmic complication: Secondary | ICD-10-CM

## 2014-06-11 DIAGNOSIS — E7800 Pure hypercholesterolemia, unspecified: Secondary | ICD-10-CM

## 2014-06-11 DIAGNOSIS — W19XXXA Unspecified fall, initial encounter: Secondary | ICD-10-CM

## 2014-06-11 DIAGNOSIS — E1142 Type 2 diabetes mellitus with diabetic polyneuropathy: Secondary | ICD-10-CM

## 2014-06-11 DIAGNOSIS — I129 Hypertensive chronic kidney disease with stage 1 through stage 4 chronic kidney disease, or unspecified chronic kidney disease: Secondary | ICD-10-CM | POA: Diagnosis not present

## 2014-06-11 DIAGNOSIS — E538 Deficiency of other specified B group vitamins: Secondary | ICD-10-CM

## 2014-06-11 DIAGNOSIS — I2510 Atherosclerotic heart disease of native coronary artery without angina pectoris: Secondary | ICD-10-CM | POA: Diagnosis not present

## 2014-06-11 DIAGNOSIS — E1165 Type 2 diabetes mellitus with hyperglycemia: Principal | ICD-10-CM

## 2014-06-11 DIAGNOSIS — R413 Other amnesia: Secondary | ICD-10-CM

## 2014-06-11 DIAGNOSIS — Z5189 Encounter for other specified aftercare: Secondary | ICD-10-CM

## 2014-06-11 DIAGNOSIS — E11319 Type 2 diabetes mellitus with unspecified diabetic retinopathy without macular edema: Secondary | ICD-10-CM

## 2014-06-11 LAB — VITAMIN B12: Vitamin B-12: 296 pg/mL (ref 211–911)

## 2014-06-11 LAB — HEMOGLOBIN A1C: Hgb A1c MFr Bld: 7.8 % — ABNORMAL HIGH (ref 4.6–6.5)

## 2014-06-11 MED ORDER — CYANOCOBALAMIN 1000 MCG/ML IJ SOLN
1000.0000 ug | Freq: Once | INTRAMUSCULAR | Status: AC
Start: 2014-06-11 — End: 2014-06-11
  Administered 2014-06-11: 1000 ug via INTRAMUSCULAR

## 2014-06-11 NOTE — Progress Notes (Signed)
Pre visit review using our clinic review tool, if applicable. No additional management support is needed unless otherwise documented below in the visit note.

## 2014-06-11 NOTE — Telephone Encounter (Signed)
Relevant patient education mailed to patient.

## 2014-06-11 NOTE — Assessment & Plan Note (Signed)
Check A1c today. rec schedule f/u with endo.

## 2014-06-11 NOTE — Patient Instructions (Signed)
Start using cane regularly. B12 shot today after blood work.  Good to see you today, call us with questions. Keep appointment for medicare wellness visit next month.

## 2014-06-11 NOTE — Assessment & Plan Note (Signed)
Chronic, stable. Continue regimen. BP Readings from Last 3 Encounters:  06/11/14 132/84  04/23/14 138/72  03/12/14 126/64

## 2014-06-11 NOTE — Assessment & Plan Note (Signed)
No falls in last month. Advised start using cane regularly, advised continue b12 shots.

## 2014-06-11 NOTE — Assessment & Plan Note (Signed)
Continue crestor 29m daily.

## 2014-06-11 NOTE — Progress Notes (Signed)
BP 132/84  Pulse 68  Temp(Src) 98.2 F (36.8 C) (Oral)  Wt 200 lb 8 oz (90.946 kg)   CC: 3 mo f/u  Subjective:    Patient ID: Frank Simpson, male    DOB: June 28, 1931, 80 y.o.   MRN: [id number]  HPI: Frank Simpson is a 80 y.o. male presenting on 06/11/2014 for Follow-up   DM - established with Dr. Cruzita Lederer. Due for f/u. Has had 1 recent low to 30. Lab Results  Component Value Date   HGBA1C 7.2* 01/09/2014    Falls - no recent falls since last fall at our office 03/2014. Has walking stick he doesn't regularly use.  HTN - Compliant with current antihypertensive regimen of amlodipine 82m daily, imdur 328mdaily.  Does check blood pressures at home: good control at home per wife.  No low blood pressure readings or symptoms of dizziness/syncope.  Denies vision changes, CP/tightness, SOB, leg swelling. Occasional headaches.  Has f/u planned with eye doctor - Dr. MaZigmund Danieletina specialist.  Recent skin cancer removed from L forearm ([place])  b12 shot today. Lab Results  Component Value Date   VITAMINB12 179* 01/09/2014    Relevant past medical, surgical, family and social history reviewed and updated as indicated.  Allergies and medications reviewed and updated. Current Outpatient Prescriptions on File Prior to Visit  Medication Sig  . acetaminophen (TYLENOL) 500 MG tablet Take 1 tablet (500 mg total) by mouth every 6 (six) hours as needed for pain.  . [name] KitchenmLODipine (NORVASC) 5 MG tablet TAKE 1 TABLET BY MOUTH EVERY DAY  . aspirin 81 MG tablet Take 1 tablet (81 mg total) by mouth daily.  . CRESTOR 40 MG tablet TAKE 1 TABLET EVERY DAY  . cyanocobalamin (,VITAMIN B-12,) 1000 MCG/ML injection Inject 1 mL (1,000 mcg total) into the muscle every 30 (thirty) days.  . [name] Kitchenesonide (DESOWEN) 0.05 % ointment   . glucose blood (ONE TOUCH ULTRA TEST) test strip Check four times daily, 250.42  . HUMALOG 100 UNIT/ML injection SLIDING SCALE: 150-200 =2UNITS, 201-250 =4UNITS, 251-300 =6UNITS,  301-350 =8UNITS, >350 =10UNITS  . insulin glargine (LANTUS) 100 UNIT/ML injection Inject 0.25 mLs (25 Units total) into the skin at bedtime.  . Insulin Pen Needle 31G X 8 MM MISC Use as directed with Lantus solostar. Dx 250.62.  . [name] Kitchennsulin Syringe-Needle U-100 (BD INSULIN SYRINGE ULTRAFINE) 31G X 5/16" 0.5 ML MISC Inject 4x a day  . isosorbide mononitrate (IMDUR) 30 MG 24 hr tablet Take 1 tablet (30 mg total) by mouth daily.  . nitroGLYCERIN (NITROSTAT) 0.4 MG SL tablet Place 1 tablet (0.4 mg total) under the tongue every 5 (five) minutes as needed for chest pain. Up to three doses,if pain continues call 911  . tamsulosin (FLOMAX) 0.4 MG CAPS capsule TAKE 1 CAPSULE BY MOUTH DAILY.  . Ticagrelor (BRILINTA) 90 MG TABS tablet TAKE 1 TABLET (90 MG TOTAL) BY MOUTH 2 (TWO) TIMES DAILY.   No current facility-administered medications on file prior to visit.    Review of Systems Per HPI unless specifically indicated above    Objective:    BP 132/84  Pulse 68  Temp(Src) 98.2 F (36.8 C) (Oral)  Wt 200 lb 8 oz (90.946 kg)  Physical Exam  Nursing note and vitals reviewed. Constitutional: He appears well-developed and well-nourished. No distress.  HENT:  Mouth/Throat: Oropharynx is clear and moist. No oropharyngeal exudate.  Cardiovascular: Normal rate, regular rhythm, normal heart sounds and intact distal pulses.   No murmur heard. Pulmonary/Chest:  Effort normal and breath sounds normal. No respiratory distress. He has no wheezes. He has no rales.  Musculoskeletal: He exhibits no edema.  Skin: Skin is warm.       Assessment & Plan:   Problem List Items Addressed This Visit   Type 2 diabetes, uncontrolled, with neuropathy - Primary   Relevant Orders      Hemoglobin A1c   Memory loss     Anticipate improved with b12. H/o positive ANA.    Hypercholesterolemia     Continue crestor 4m daily.    Fall     No falls in last month. Advised start using cane regularly, advised continue b12  shots.    Diabetes mellitus type 2 with retinopathy     Check A1c today. rec schedule f/u with endo.    Benign hypertension with CKD (chronic kidney disease) stage III      Chronic, stable. Continue regimen. BP Readings from Last 3 Encounters:  06/11/14 132/84  04/23/14 138/72  03/12/14 126/64      B12 deficiency      b12 check again today then B12 shot. Would continue for 4 more months, then consider oral replacement. Lab Results  Component Value Date   VITAMINB12 179* 01/09/2014  anticipate b12 def contributing to memory troubles and possibly imbalance.    Relevant Orders      Vitamin B12       Follow up plan: Return as needed, for previously scheduled appt.

## 2014-06-11 NOTE — Addendum Note (Signed)
Addended by: Royann Shivers A on: 06/11/2014 11:00 AM   Modules accepted: Orders

## 2014-06-11 NOTE — Assessment & Plan Note (Signed)
Anticipate improved with b12. H/o positive ANA.

## 2014-06-11 NOTE — Assessment & Plan Note (Signed)
b12 check again today then B12 shot. Would continue for 4 more months, then consider oral replacement. Lab Results  Component Value Date   VITAMINB12 179* 01/09/2014  anticipate b12 def contributing to memory troubles and possibly imbalance.

## 2014-06-12 ENCOUNTER — Encounter: Payer: Self-pay | Admitting: *Deleted

## 2014-06-23 ENCOUNTER — Other Ambulatory Visit: Payer: Self-pay | Admitting: Cardiology

## 2014-06-24 ENCOUNTER — Other Ambulatory Visit: Payer: Self-pay | Admitting: Family Medicine

## 2014-06-28 ENCOUNTER — Other Ambulatory Visit: Payer: Self-pay | Admitting: Family Medicine

## 2014-06-28 DIAGNOSIS — D696 Thrombocytopenia, unspecified: Secondary | ICD-10-CM

## 2014-06-28 DIAGNOSIS — N183 Chronic kidney disease, stage 3 unspecified: Secondary | ICD-10-CM

## 2014-07-03 ENCOUNTER — Ambulatory Visit: Payer: Medicare Other

## 2014-07-03 ENCOUNTER — Other Ambulatory Visit (INDEPENDENT_AMBULATORY_CARE_PROVIDER_SITE_OTHER): Payer: Medicare Other

## 2014-07-03 DIAGNOSIS — D696 Thrombocytopenia, unspecified: Secondary | ICD-10-CM

## 2014-07-03 DIAGNOSIS — N183 Chronic kidney disease, stage 3 unspecified: Secondary | ICD-10-CM

## 2014-07-03 LAB — CBC WITH DIFFERENTIAL/PLATELET
BASOS PCT: 0.3 % (ref 0.0–3.0)
Basophils Absolute: 0 10*3/uL (ref 0.0–0.1)
Eosinophils Absolute: 0.1 10*3/uL (ref 0.0–0.7)
Eosinophils Relative: 1.1 % (ref 0.0–5.0)
HCT: 47.9 % (ref 39.0–52.0)
Hemoglobin: 16.4 g/dL (ref 13.0–17.0)
Lymphocytes Relative: 37.3 % (ref 12.0–46.0)
Lymphs Abs: 2.5 10*3/uL (ref 0.7–4.0)
MCHC: 34.3 g/dL (ref 30.0–36.0)
MCV: 93.9 fl (ref 78.0–100.0)
MONO ABS: 0.5 10*3/uL (ref 0.1–1.0)
Monocytes Relative: 7.7 % (ref 3.0–12.0)
NEUTROS PCT: 53.6 % (ref 43.0–77.0)
Neutro Abs: 3.6 10*3/uL (ref 1.4–7.7)
Platelets: 165 10*3/uL (ref 150.0–400.0)
RBC: 5.11 Mil/uL (ref 4.22–5.81)
RDW: 14.1 % (ref 11.5–15.5)
WBC: 6.8 10*3/uL (ref 4.0–10.5)

## 2014-07-03 LAB — RENAL FUNCTION PANEL
Albumin: 3.5 g/dL (ref 3.5–5.2)
BUN: 26 mg/dL — AB (ref 6–23)
CO2: 29 mEq/L (ref 19–32)
Calcium: 8.9 mg/dL (ref 8.4–10.5)
Chloride: 106 mEq/L (ref 96–112)
Creatinine, Ser: 1.6 mg/dL — ABNORMAL HIGH (ref 0.4–1.5)
GFR: 44.25 mL/min — ABNORMAL LOW (ref >60.00)
GLUCOSE: 138 mg/dL — AB (ref 70–99)
PHOSPHORUS: 2.8 mg/dL (ref 2.3–4.6)
POTASSIUM: 4.3 meq/L (ref 3.5–5.1)
Sodium: 139 mEq/L (ref 135–145)

## 2014-07-09 ENCOUNTER — Ambulatory Visit (INDEPENDENT_AMBULATORY_CARE_PROVIDER_SITE_OTHER): Payer: Medicare Other | Admitting: Family Medicine

## 2014-07-09 ENCOUNTER — Encounter: Payer: Self-pay | Admitting: Family Medicine

## 2014-07-09 VITALS — BP 140/78 | HR 72 | Temp 98.1°F | Ht 72.0 in | Wt 204.5 lb

## 2014-07-09 DIAGNOSIS — I2510 Atherosclerotic heart disease of native coronary artery without angina pectoris: Secondary | ICD-10-CM | POA: Diagnosis not present

## 2014-07-09 DIAGNOSIS — W19XXXD Unspecified fall, subsequent encounter: Secondary | ICD-10-CM

## 2014-07-09 DIAGNOSIS — Z0000 Encounter for general adult medical examination without abnormal findings: Secondary | ICD-10-CM | POA: Diagnosis not present

## 2014-07-09 DIAGNOSIS — N183 Chronic kidney disease, stage 3 unspecified: Secondary | ICD-10-CM

## 2014-07-09 DIAGNOSIS — E538 Deficiency of other specified B group vitamins: Secondary | ICD-10-CM

## 2014-07-09 DIAGNOSIS — I129 Hypertensive chronic kidney disease with stage 1 through stage 4 chronic kidney disease, or unspecified chronic kidney disease: Secondary | ICD-10-CM

## 2014-07-09 DIAGNOSIS — R413 Other amnesia: Secondary | ICD-10-CM | POA: Diagnosis not present

## 2014-07-09 DIAGNOSIS — F39 Unspecified mood [affective] disorder: Secondary | ICD-10-CM

## 2014-07-09 DIAGNOSIS — E1122 Type 2 diabetes mellitus with diabetic chronic kidney disease: Secondary | ICD-10-CM

## 2014-07-09 DIAGNOSIS — Z23 Encounter for immunization: Secondary | ICD-10-CM

## 2014-07-09 DIAGNOSIS — Z5189 Encounter for other specified aftercare: Secondary | ICD-10-CM

## 2014-07-09 DIAGNOSIS — W19XXXA Unspecified fall, initial encounter: Secondary | ICD-10-CM

## 2014-07-09 DIAGNOSIS — E1129 Type 2 diabetes mellitus with other diabetic kidney complication: Secondary | ICD-10-CM | POA: Diagnosis not present

## 2014-07-09 DIAGNOSIS — F332 Major depressive disorder, recurrent severe without psychotic features: Secondary | ICD-10-CM | POA: Insufficient documentation

## 2014-07-09 MED ORDER — CYANOCOBALAMIN 1000 MCG/ML IJ SOLN
1000.0000 ug | Freq: Once | INTRAMUSCULAR | Status: AC
  Administered 2014-07-09: 1000 ug via INTRAMUSCULAR

## 2014-07-09 MED ORDER — SERTRALINE HCL 25 MG PO TABS: 25.0000 mg | ORAL_TABLET | Freq: Every day | ORAL | Status: DC

## 2014-07-09 NOTE — Assessment & Plan Note (Addendum)
I have personally reviewed the Medicare Annual Wellness questionnaire and have noted 1. The patient's medical and social history 2. Their use of alcohol, tobacco or illicit drugs 3. Their current medications and supplements 4. The patient's functional ability including ADL's, fall risks, home safety risks and hearing or visual impairment. 5. Diet and physical activity 6. Evidence for depression or mood disorders The patients weight, height, BMI have been recorded in the chart.  Hearing and vision has been addressed. I have made referrals, counseling and provided education to the patient based review of the above and I have provided the pt with a written personalized care plan for preventive services. Provider list updated - see scanned questionairre. Advanced directives discussed: packet provided   Reviewed preventative protocols and updated unless pt declined.

## 2014-07-09 NOTE — Assessment & Plan Note (Signed)
Stable. Continue regimen.

## 2014-07-09 NOTE — Assessment & Plan Note (Addendum)
Improved with B12 shots. Persistent trouble with some questions today - consider MMSE next visit.

## 2014-07-09 NOTE — Progress Notes (Signed)
Pre visit review using our clinic review tool, if applicable. No additional management support is needed unless otherwise documented below in the visit note.

## 2014-07-09 NOTE — Assessment & Plan Note (Signed)
Regular with cane use. Less falls with this. All have been mechanical falls. Consider PT fall prevention program.

## 2014-07-09 NOTE — Assessment & Plan Note (Signed)
b12 shot today. Lab Results  Component Value Date   VITAMINB12 296 06/11/2014  memory improvement noted with shots.

## 2014-07-09 NOTE — Assessment & Plan Note (Signed)
Trouble controlling emotions with crying spells in office. Significant stressors with neighbor conflicts recently. Possible psedodementia given poor effort noted on questioning. Agrees to trial of sertraline 46m daily. Discussed common side effects. To notify me if not tolerating med well.

## 2014-07-09 NOTE — Progress Notes (Signed)
BP 140/78  Pulse 72  Temp(Src) 98.1 F (36.7 C) (Oral)  Ht 6' (1.829 m)  Wt 204 lb 8 oz (92.761 kg)  BMI 27.73 kg/m2   CC: medicare wellness visit  Subjective:    Patient ID: Frank Simpson, male    DOB: 09-Mar-1931, 80 y.o.   MRN: [id number]  HPI: Frank Simpson is a 80 y.o. male presenting on 07/09/2014 for Annual Exam   CKD stable over last 5 years  Failed hearing - declines hearing aides. Passes vision Denies depression/anhedonia - on further questioning, significant stress with neighbor conflict with property border lines. Trouble controlling emotions, actually may have contributed to recent MI. + falls risk - several falls, tripping. Regularly uses cane. No premonitory sxs - all mechanical.  Some dizziness when looking upwards.  Preventative: Colon cancer screening - has aged out. No blood in stool Prostate cancer screening - has aged out. Flu shot 2014 Pneumovax 2010 prevnar today zostavax - declines Advanced directives - discussed, unsure about this. Packet provided.  Caffeine: 2-3 caffeinated drinks/day Lives with wife Frank Simpson , no pets Occupation: retired, used to work [employer] Activity: works around house Diet: healthy overall. Good fruits and vegetables, good amt water  Relevant past medical, surgical, family and social history reviewed and updated as indicated.  Allergies and medications reviewed and updated. Current Outpatient Prescriptions on File Prior to Visit  Medication Sig  . acetaminophen (TYLENOL) 500 MG tablet Take 1 tablet (500 mg total) by mouth every 6 (six) hours as needed for pain.  [name] Kitchen amLODipine (NORVASC) 5 MG tablet TAKE 1 TABLET BY MOUTH EVERY DAY  . aspirin 81 MG tablet Take 1 tablet (81 mg total) by mouth daily.  . CRESTOR 40 MG tablet TAKE 1 TABLET EVERY DAY  . cyanocobalamin (,VITAMIN B-12,) 1000 MCG/ML injection Inject 1 mL (1,000 mcg total) into the muscle every 30 (thirty) days.  [name] Kitchen glucose blood (ONE TOUCH ULTRA TEST)  test strip Check four times daily, 250.42  . HUMALOG 100 UNIT/ML injection SLIDING SCALE: 150-200 =2UNITS, 201-250 =4UNITS, 251-300 =6UNITS, 301-350 =8UNITS, >350 =10UNITS  . Insulin Syringe-Needle U-100 (BD INSULIN SYRINGE ULTRAFINE) 31G X 5/16" 0.5 ML MISC Inject 4x a day  . isosorbide mononitrate (IMDUR) 30 MG 24 hr tablet Take 1 tablet (30 mg total) by mouth daily.  . nitroGLYCERIN (NITROSTAT) 0.4 MG SL tablet Place 1 tablet (0.4 mg total) under the tongue every 5 (five) minutes as needed for chest pain. Up to three doses,if pain continues call 911  . tamsulosin (FLOMAX) 0.4 MG CAPS capsule TAKE 1 CAPSULE BY MOUTH DAILY.  . Ticagrelor (BRILINTA) 90 MG TABS tablet TAKE 1 TABLET (90 MG TOTAL) BY MOUTH 2 (TWO) TIMES DAILY.  [name] Kitchen desonide (DESOWEN) 0.05 % ointment   . Insulin Pen Needle 31G X 8 MM MISC Use as directed with Lantus solostar. Dx 250.62.   No current facility-administered medications on file prior to visit.    Review of Systems Per HPI unless specifically indicated above    Objective:    BP 140/78  Pulse 72  Temp(Src) 98.1 F (36.7 C) (Oral)  Ht 6' (1.829 m)  Wt 204 lb 8 oz (92.761 kg)  BMI 27.73 kg/m2  Physical Exam  Nursing note and vitals reviewed. Constitutional: He appears well-developed and well-nourished. No distress.  HENT:  Head: Normocephalic and atraumatic.  Right Ear: Hearing, tympanic membrane, external ear and ear canal normal.  Left Ear: Hearing, tympanic membrane, external ear and ear canal normal.  Nose: Nose normal.  Mouth/Throat: Uvula is midline, oropharynx is clear and moist and mucous membranes are normal. No oropharyngeal exudate, posterior oropharyngeal edema or posterior oropharyngeal erythema.  Eyes: Conjunctivae and EOM are normal. Pupils are equal, round, and reactive to light. No scleral icterus.  Neck: Normal range of motion. Neck supple.  Cardiovascular: Normal rate, regular rhythm, normal heart sounds and intact distal pulses.   No murmur  heard. Pulses:      Radial pulses are 2+ on the right side, and 2+ on the left side.  Pulmonary/Chest: Effort normal and breath sounds normal. No respiratory distress. He has no wheezes. He has no rales.  Abdominal: Soft. Bowel sounds are normal. He exhibits no distension and no mass. There is no tenderness. There is no rebound and no guarding.  Musculoskeletal: Normal range of motion. He exhibits no edema.  Lymphadenopathy:    He has no cervical adenopathy.  Neurological: He is alert.  CN grossly intact, station and gait intact Does not know any parts of date Recall 2/3 Calculation unable to complete despite 11th grade education  Skin: Skin is warm and dry. No rash noted.  Psychiatric: His speech is normal and behavior is normal. Thought content normal. His affect is angry. He exhibits a depressed mood.  Cries easily discussing stressors   Results for orders placed in visit on 07/03/14  CBC WITH DIFFERENTIAL      Result Value Ref Range   WBC 6.8  4.0 - 10.5 K/uL   RBC 5.11  4.22 - 5.81 Mil/uL   Hemoglobin 16.4  13.0 - 17.0 g/dL   HCT 47.9  39.0 - 52.0 %   MCV 93.9  78.0 - 100.0 fl   MCHC 34.3  30.0 - 36.0 g/dL   RDW 14.1  11.5 - 15.5 %   Platelets 165.0  150.0 - 400.0 K/uL   Neutrophils Relative % 53.6  43.0 - 77.0 %   Lymphocytes Relative 37.3  12.0 - 46.0 %   Monocytes Relative 7.7  3.0 - 12.0 %   Eosinophils Relative 1.1  0.0 - 5.0 %   Basophils Relative 0.3  0.0 - 3.0 %   Neutro Abs 3.6  1.4 - 7.7 K/uL   Lymphs Abs 2.5  0.7 - 4.0 K/uL   Monocytes Absolute 0.5  0.1 - 1.0 K/uL   Eosinophils Absolute 0.1  0.0 - 0.7 K/uL   Basophils Absolute 0.0  0.0 - 0.1 K/uL  RENAL FUNCTION PANEL      Result Value Ref Range   Sodium 139  135 - 145 mEq/L   Potassium 4.3  3.5 - 5.1 mEq/L   Chloride 106  96 - 112 mEq/L   CO2 29  19 - 32 mEq/L   Calcium 8.9  8.4 - 10.5 mg/dL   Albumin 3.5  3.5 - 5.2 g/dL   BUN 26 (*) 6 - 23 mg/dL   Creatinine, Ser 1.6 (*) 0.4 - 1.5 mg/dL   Glucose, Bld  138 (*) 70 - 99 mg/dL   Phosphorus 2.8  2.3 - 4.6 mg/dL   GFR 44.25 (*) >60.00 mL/min      Assessment & Plan:   Problem List Items Addressed This Visit   CKD stage 3 due to type 2 diabetes mellitus     Reviewed stable creatinine over last 5 years, discussed avoidance of NSAIDs and regular water use.    Relevant Medications      insulin glargine (LANTUS) 100 UNIT/ML injection   Benign hypertension with  CKD (chronic kidney disease) stage III     Stable. Continue regimen.    Memory loss     Improved with B12 shots. Persistent trouble with some questions today - consider MMSE next visit.    Fall     Regular with cane use. Less falls with this. All have been mechanical falls. Consider PT fall prevention program.    B12 deficiency      b12 shot today. Lab Results  Component Value Date   VITAMINB12 296 06/11/2014  memory improvement noted with shots.    Medicare annual wellness visit, initial - Primary     I have personally reviewed the Medicare Annual Wellness questionnaire and have noted 1. The patient's medical and social history 2. Their use of alcohol, tobacco or illicit drugs 3. Their current medications and supplements 4. The patient's functional ability including ADL's, fall risks, home safety risks and hearing or visual impairment. 5. Diet and physical activity 6. Evidence for depression or mood disorders The patients weight, height, BMI have been recorded in the chart.  Hearing and vision has been addressed. I have made referrals, counseling and provided education to the patient based review of the above and I have provided the pt with a written personalized care plan for preventive services. Provider list updated - see scanned questionairre. Advanced directives discussed: packet provided   Reviewed preventative protocols and updated unless pt declined.    Mood disorder     Trouble controlling emotions with crying spells in office. Significant stressors with neighbor  conflicts recently. Possible psedodementia given poor effort noted on questioning. Agrees to trial of sertraline 51m daily. Discussed common side effects. To notify me if not tolerating med well.        Follow up plan: Return in about 6 months (around 01/09/2015), or as needed, for follow up visit.

## 2014-07-09 NOTE — Assessment & Plan Note (Signed)
Reviewed stable creatinine over last 5 years, discussed avoidance of NSAIDs and regular water use.

## 2014-07-09 NOTE — Patient Instructions (Addendum)
Prevnar today. Call your insurance about the shingles shot to see if it is covered or how much it would cost and where is cheaper (here or pharmacy).  If you want to receive here, call for nurse visit. Advanced directive packet provided today. b12 shot today. Good to see you today, call us with questions. Return in 5-6 months for follow up visit.

## 2014-07-11 ENCOUNTER — Encounter: Payer: Medicare Other | Admitting: Family Medicine

## 2014-07-14 ENCOUNTER — Encounter (INDEPENDENT_AMBULATORY_CARE_PROVIDER_SITE_OTHER): Payer: Medicare Other | Admitting: Ophthalmology

## 2014-07-25 ENCOUNTER — Other Ambulatory Visit: Payer: Self-pay | Admitting: *Deleted

## 2014-07-25 MED ORDER — INSULIN GLARGINE 100 UNIT/ML ~~LOC~~ SOLN: 25.0000 [IU] | Freq: Every day | SUBCUTANEOUS | Status: DC

## 2014-08-07 ENCOUNTER — Telehealth: Payer: Self-pay | Admitting: Family Medicine

## 2014-08-07 ENCOUNTER — Ambulatory Visit (INDEPENDENT_AMBULATORY_CARE_PROVIDER_SITE_OTHER): Payer: Medicare Other

## 2014-08-07 ENCOUNTER — Other Ambulatory Visit: Payer: Self-pay | Admitting: Cardiology

## 2014-08-07 DIAGNOSIS — E538 Deficiency of other specified B group vitamins: Secondary | ICD-10-CM | POA: Diagnosis not present

## 2014-08-07 MED ORDER — CYANOCOBALAMIN 1000 MCG/ML IJ SOLN
1000.0000 ug | Freq: Once | INTRAMUSCULAR | Status: AC
Start: 2014-08-07 — End: 2014-08-07
  Administered 2014-08-07: 1000 ug via INTRAMUSCULAR

## 2014-08-07 MED ORDER — SERTRALINE HCL 25 MG PO TABS: 25.0000 mg | ORAL_TABLET | Freq: Every day | ORAL | Status: DC

## 2014-08-07 NOTE — Telephone Encounter (Signed)
Refilled.

## 2014-08-07 NOTE — Telephone Encounter (Signed)
Pt stated he needed refill on  Sertraline hcl 69m tablet  Take 1 tablet every  Day  Pt would like 90 day supply [facility]  Pt only has 1 pill left

## 2014-08-13 DIAGNOSIS — L570 Actinic keratosis: Secondary | ICD-10-CM | POA: Diagnosis not present

## 2014-08-13 DIAGNOSIS — Z85828 Personal history of other malignant neoplasm of skin: Secondary | ICD-10-CM | POA: Diagnosis not present

## 2014-08-13 DIAGNOSIS — D1801 Hemangioma of skin and subcutaneous tissue: Secondary | ICD-10-CM | POA: Diagnosis not present

## 2014-08-13 DIAGNOSIS — D235 Other benign neoplasm of skin of trunk: Secondary | ICD-10-CM | POA: Diagnosis not present

## 2014-08-13 DIAGNOSIS — L821 Other seborrheic keratosis: Secondary | ICD-10-CM | POA: Diagnosis not present

## 2014-09-02 ENCOUNTER — Telehealth: Payer: Self-pay

## 2014-09-02 DIAGNOSIS — IMO0002 Reserved for concepts with insufficient information to code with codable children: Secondary | ICD-10-CM

## 2014-09-02 DIAGNOSIS — N183 Hypertensive chronic kidney disease with stage 1 through stage 4 chronic kidney disease, or unspecified chronic kidney disease: Secondary | ICD-10-CM

## 2014-09-02 DIAGNOSIS — E1165 Type 2 diabetes mellitus with hyperglycemia: Principal | ICD-10-CM

## 2014-09-02 DIAGNOSIS — I129 Hypertensive chronic kidney disease with stage 1 through stage 4 chronic kidney disease, or unspecified chronic kidney disease: Secondary | ICD-10-CM

## 2014-09-02 DIAGNOSIS — E1140 Type 2 diabetes mellitus with diabetic neuropathy, unspecified: Secondary | ICD-10-CM

## 2014-09-02 MED ORDER — GLUCOSE BLOOD VI STRP: ORAL_STRIP | Status: DC

## 2014-09-02 NOTE — Telephone Encounter (Signed)
[facility] left v/m requesting new refill of glucose test strips with new ICD 10 code.done.(got ICD 10 code off pts problem list.)

## 2014-09-05 DIAGNOSIS — Z23 Encounter for immunization: Secondary | ICD-10-CM | POA: Diagnosis not present

## 2014-09-10 ENCOUNTER — Ambulatory Visit (INDEPENDENT_AMBULATORY_CARE_PROVIDER_SITE_OTHER): Payer: Medicare Other

## 2014-09-10 DIAGNOSIS — E538 Deficiency of other specified B group vitamins: Secondary | ICD-10-CM | POA: Diagnosis not present

## 2014-09-10 MED ORDER — CYANOCOBALAMIN 1000 MCG/ML IJ SOLN
1000.0000 ug | Freq: Once | INTRAMUSCULAR | Status: AC
  Administered 2014-09-10: 1000 ug via INTRAMUSCULAR

## 2014-09-11 DIAGNOSIS — Z961 Presence of intraocular lens: Secondary | ICD-10-CM | POA: Diagnosis not present

## 2014-09-11 DIAGNOSIS — H40013 Open angle with borderline findings, low risk, bilateral: Secondary | ICD-10-CM | POA: Diagnosis not present

## 2014-09-11 DIAGNOSIS — E119 Type 2 diabetes mellitus without complications: Secondary | ICD-10-CM | POA: Diagnosis not present

## 2014-09-11 DIAGNOSIS — E11319 Type 2 diabetes mellitus with unspecified diabetic retinopathy without macular edema: Secondary | ICD-10-CM | POA: Diagnosis not present

## 2014-09-25 ENCOUNTER — Other Ambulatory Visit (INDEPENDENT_AMBULATORY_CARE_PROVIDER_SITE_OTHER): Payer: Medicare Other | Admitting: *Deleted

## 2014-09-25 ENCOUNTER — Encounter: Payer: Self-pay | Admitting: Cardiology

## 2014-09-25 ENCOUNTER — Ambulatory Visit (INDEPENDENT_AMBULATORY_CARE_PROVIDER_SITE_OTHER): Payer: Medicare Other | Admitting: Cardiology

## 2014-09-25 VITALS — BP 158/86 | HR 59 | Ht 72.0 in | Wt 205.4 lb

## 2014-09-25 DIAGNOSIS — I5023 Acute on chronic systolic (congestive) heart failure: Secondary | ICD-10-CM | POA: Diagnosis not present

## 2014-09-25 DIAGNOSIS — B0229 Other postherpetic nervous system involvement: Secondary | ICD-10-CM | POA: Diagnosis not present

## 2014-09-25 DIAGNOSIS — I119 Hypertensive heart disease without heart failure: Secondary | ICD-10-CM

## 2014-09-25 DIAGNOSIS — E785 Hyperlipidemia, unspecified: Secondary | ICD-10-CM | POA: Diagnosis not present

## 2014-09-25 DIAGNOSIS — I129 Hypertensive chronic kidney disease with stage 1 through stage 4 chronic kidney disease, or unspecified chronic kidney disease: Secondary | ICD-10-CM

## 2014-09-25 DIAGNOSIS — N183 Chronic kidney disease, stage 3 unspecified: Secondary | ICD-10-CM

## 2014-09-25 DIAGNOSIS — E7800 Pure hypercholesterolemia, unspecified: Secondary | ICD-10-CM

## 2014-09-25 DIAGNOSIS — I200 Unstable angina: Secondary | ICD-10-CM | POA: Diagnosis not present

## 2014-09-25 DIAGNOSIS — I259 Chronic ischemic heart disease, unspecified: Secondary | ICD-10-CM | POA: Diagnosis not present

## 2014-09-25 DIAGNOSIS — H8110 Benign paroxysmal vertigo, unspecified ear: Secondary | ICD-10-CM

## 2014-09-25 LAB — LIPID PANEL
CHOLESTEROL: 226 mg/dL — AB (ref 0–200)
HDL: 40.5 mg/dL (ref >39.00)
LDL Cholesterol: 148 mg/dL — ABNORMAL HIGH (ref 0–99)
NonHDL: 185.5
TRIGLYCERIDES: 187 mg/dL — AB (ref 0.0–149.0)
Total CHOL/HDL Ratio: 6
VLDL: 37.4 mg/dL (ref 0.0–40.0)

## 2014-09-25 LAB — BASIC METABOLIC PANEL
BUN: 24 mg/dL — ABNORMAL HIGH (ref 6–23)
CALCIUM: 8.9 mg/dL (ref 8.4–10.5)
CO2: 24 meq/L (ref 19–32)
CREATININE: 1.8 mg/dL — AB (ref 0.4–1.5)
Chloride: 103 mEq/L (ref 96–112)
GFR: 38.85 mL/min — AB (ref >60.00)
Glucose, Bld: 215 mg/dL — ABNORMAL HIGH (ref 70–99)
Potassium: 4.1 mEq/L (ref 3.5–5.1)
SODIUM: 135 meq/L (ref 135–145)

## 2014-09-25 LAB — HEPATIC FUNCTION PANEL
ALBUMIN: 3.2 g/dL — AB (ref 3.5–5.2)
ALT: 11 U/L (ref 0–53)
AST: 16 U/L (ref 0–37)
Alkaline Phosphatase: 47 U/L (ref 39–117)
Bilirubin, Direct: 0 mg/dL (ref 0.0–0.3)
TOTAL PROTEIN: 7.1 g/dL (ref 6.0–8.3)
Total Bilirubin: 0.9 mg/dL (ref 0.2–1.2)

## 2014-09-25 MED ORDER — ROSUVASTATIN CALCIUM 40 MG PO TABS: 40.0000 mg | ORAL_TABLET | Freq: Every day | ORAL | Status: DC

## 2014-09-25 MED ORDER — NITROGLYCERIN 0.4 MG SL SUBL: 0.4000 mg | SUBLINGUAL_TABLET | SUBLINGUAL | Status: DC | PRN

## 2014-09-25 NOTE — Assessment & Plan Note (Signed)
His blood pressure was higher today but he did not take any of his medicines this morning.  He will monitor his blood pressure at home.  They have a blood pressure cuff.

## 2014-09-25 NOTE — Assessment & Plan Note (Signed)
The patient has not had any symptoms of congestive heart failure.  No fluid retention.  No increased dyspnea.

## 2014-09-25 NOTE — Assessment & Plan Note (Signed)
At his request we gave him a prescription to get a shingles shot.

## 2014-09-25 NOTE — Progress Notes (Signed)
Frank Simpson Date of Birth:  10-07-1931 [place] [address] [place], [place]  [address] [phone]        Fax   [phone]   History of Present Illness: Frank Simpson is seen back today for a scheduled followup office visit. On 08/06/2012 the patient had a STEMI with DES to the mid LAD. He is on Brilinta and a baby aspirin daily. His other issues include CKD, HTN, DM, HLD, OA, urinary incontinence, glaucoma, melanoma and thrombocytopenia.  Since last visit he has been doing well. No recurrent angina. The patient has a history of chronic right-sided chest discomfort secondary to postherpetic neuralgia. He had his episode of shingles about 22 months ago. Since last visit he has not been experiencing any definite recurrent angina pectoris. He is diabetic. He has been having occasional hypoglycemic episodes.  He has issues with poor balance.  He walks with a cane.  He has occasional episodes of vertigo with the room spinning around. He has not had any significant hypoglycemic episodes.  Current Outpatient Prescriptions  Medication Sig Dispense Refill  . acetaminophen (TYLENOL) 500 MG tablet Take 1 tablet (500 mg total) by mouth every 6 (six) hours as needed for pain.      [name] Kitchen amLODipine (NORVASC) 5 MG tablet TAKE 1 TABLET BY MOUTH EVERY DAY  90 tablet  0  . aspirin 81 MG tablet Take 1 tablet (81 mg total) by mouth daily.  30 tablet    . cyanocobalamin (,VITAMIN B-12,) 1000 MCG/ML injection Inject 1 mL (1,000 mcg total) into the muscle every 30 (thirty) days.  1 mL    . glucose blood (ONE TOUCH ULTRA TEST) test strip Check four times daily,Dx E11.319  120 each  5  . HUMALOG 100 UNIT/ML injection SLIDING SCALE: 150-200 =2UNITS, 201-250 =4UNITS, 251-300 =6UNITS, 301-350 =8UNITS, >350 =10UNITS  30 mL  0  . insulin glargine (LANTUS) 100 UNIT/ML injection Inject 0.25 mLs (25 Units total) into the skin at bedtime.  30 mL  0  . Insulin Pen Needle 31G X 8 MM MISC Use as  directed with Lantus solostar. Dx 250.62.  100 each  5  . Insulin Syringe-Needle U-100 (BD INSULIN SYRINGE ULTRAFINE) 31G X 5/16" 0.5 ML MISC Inject 4x a day  400 each  3  . isosorbide mononitrate (IMDUR) 30 MG 24 hr tablet Take 1 tablet (30 mg total) by mouth daily.  60 tablet  6  . meclizine (ANTIVERT) 25 MG tablet Take 25 mg by mouth every 6 (six) hours as needed for dizziness.       . nitroGLYCERIN (NITROSTAT) 0.4 MG SL tablet Place 1 tablet (0.4 mg total) under the tongue every 5 (five) minutes as needed for chest pain. Up to three doses,if pain continues call 911  25 tablet  3  . rosuvastatin (CRESTOR) 40 MG tablet Take 1 tablet (40 mg total) by mouth daily.  30 tablet  11  . sertraline (ZOLOFT) 25 MG tablet Take 1 tablet (25 mg total) by mouth daily.  90 tablet  0  . tamsulosin (FLOMAX) 0.4 MG CAPS capsule TAKE 1 CAPSULE BY MOUTH DAILY.  90 capsule  3  . Ticagrelor (BRILINTA) 90 MG TABS tablet TAKE 1 TABLET (90 MG TOTAL) BY MOUTH 2 (TWO) TIMES DAILY.  180 tablet  3   No current facility-administered medications for this visit.    Allergies  Allergen Reactions  . Januvia [Sitagliptin Phosphate] Other (See Comments)    Possibly  affected kidneys?  . Metformin And Related Other (See Comments)    Affected kidneys  . Oxycodone     Severe confusion  . Penicillins Other (See Comments)    Reaction long time ago - doesn't remember    Patient Active Problem List   Diagnosis Date Noted  . Benign positional vertigo 09/25/2014  . Medicare annual wellness visit, initial 07/09/2014  . Mood disorder 07/09/2014  . B12 deficiency 06/11/2014  . Fall 04/23/2014  . Diabetes mellitus type 2 with retinopathy 02/19/2014  . Rash of face 01/03/2014  . Shoulder injury 07/10/2013  . Right knee pain 07/10/2013  . Memory loss 04/16/2013  . Syncope 04/02/2013  . Post herpetic neuralgia 02/26/2013  . Unstable angina 10/15/2012  . CAD (coronary artery disease) 10/15/2012  . Acute on chronic systolic  CHF (congestive heart failure) 10/15/2012  . Benign hypertension with CKD (chronic kidney disease) stage III 10/15/2012  . Thrombocytopenia   . STEMI (ST elevation myocardial infarction) 08/06/2012  . Osteoarthritis   . CKD stage 3 due to type 2 diabetes mellitus   . Urinary incontinence   . Type 2 diabetes, uncontrolled, with neuropathy 07/18/2011  . Hypercholesterolemia 07/18/2011    History  Smoking status  . Never Smoker   Smokeless tobacco  . Never Used    History  Alcohol Use No    Family History  Problem Relation Age of Onset  . Stroke Mother     hemorrhage  . Diabetes Mother   . Cancer Father     lung  . Diabetes Brother     Review of Systems: Constitutional: no fever chills diaphoresis or fatigue or change in weight.  Head and neck: no hearing loss, no epistaxis, no photophobia or visual disturbance. Respiratory: No cough, shortness of breath or wheezing. Cardiovascular: No chest pain peripheral edema, palpitations. Gastrointestinal: No abdominal distention, no abdominal pain, no change in bowel habits hematochezia or melena. Genitourinary: No dysuria, no frequency, no urgency, no nocturia. Musculoskeletal:No arthralgias, no back pain, no gait disturbance or myalgias. Neurological: No dizziness, no headaches, no numbness, no seizures, no syncope, no weakness, no tremors. Hematologic: No lymphadenopathy, no easy bruising. Psychiatric: No confusion, no hallucinations, no sleep disturbance.    Physical Exam: Filed Vitals:   09/25/14 0828  BP: 158/86  Pulse: 59  The patient appears to be in no distress.  Head and neck exam reveals that the pupils are equal and reactive.  The extraocular movements are full.  There is no scleral icterus.  Mouth and pharynx are benign.  No lymphadenopathy.  No carotid bruits.  The jugular venous pressure is normal.  Thyroid is not enlarged or tender.  Chest is clear to percussion and auscultation.  No rales or rhonchi.   Expansion of the chest is symmetrical.  Heart reveals no abnormal lift or heave.  First and second heart sounds are normal.  There is no murmur gallop rub or click.  The abdomen is soft and nontender.  Bowel sounds are normoactive.  There is no hepatosplenomegaly or mass.  There are no abdominal bruits.  Extremities reveal no phlebitis or edema.  Pedal pulses are good.  There is no cyanosis or clubbing.  Neurologic exam is normal strength and no lateralizing weakness.  No sensory deficits.  Integument reveals no rash  EKG today shows sinus bradycardia with first-degree AV block.  There is left anterior hemiblock and LVH with secondary ST-T wave changes.  Since last tracing of 04/23/13, no significant change  Assessment /  Plan: 1.  This disease status post STEMI with drug-eluting stent to the mid LAD on 08/06/12.  On dual antiplatelet therapy. 2.  Hypertensive heart disease without heart failure 3. Hypercholesterolemia 4. diabetes mellitus adult onset followed by PCP 5. benign positional vertigo 6. postherpetic neuralgia 7. chronic kidney disease 8. Osteoarthritis  Disposition: We gave him a prescription for meclizine 25 mg to have on hand for intermittent vertigo.  He will refill his sublingual nitroglycerin which are outdated. Continue close followup for his diabetes with his PCP Dr. Danise Mina. Recheck in 6 months for office visit lipid panel hepatic function panel and basal metabolic panel

## 2014-09-25 NOTE — Assessment & Plan Note (Signed)
The patient has not been having a recent chest pain or angina.

## 2014-09-25 NOTE — Assessment & Plan Note (Signed)
He is on high-dose Crestor 40 mg daily.  He is not having any myalgias.  We are checking lab work today.

## 2014-09-25 NOTE — Patient Instructions (Signed)
USE MECLIZINE 25 MG (DRAMAMINE NON DROWSY) EVERY 6 HOURS AS NEEDED FOR DIZZINESS  Your physician wants you to follow-up in: 6 months with fasting labs (lp/bmet/hfp) You will receive a reminder letter in the mail two months in advance. If you don't receive a letter, please call our office to schedule the follow-up appointment.

## 2014-09-30 ENCOUNTER — Other Ambulatory Visit: Payer: Self-pay | Admitting: *Deleted

## 2014-09-30 MED ORDER — INSULIN LISPRO 100 UNIT/ML ~~LOC~~ SOLN: SUBCUTANEOUS | Status: DC

## 2014-10-03 ENCOUNTER — Emergency Department (HOSPITAL_COMMUNITY): Payer: Medicare Other

## 2014-10-03 ENCOUNTER — Other Ambulatory Visit: Payer: Self-pay

## 2014-10-03 ENCOUNTER — Encounter (HOSPITAL_COMMUNITY): Payer: Self-pay | Admitting: *Deleted

## 2014-10-03 ENCOUNTER — Inpatient Hospital Stay (HOSPITAL_COMMUNITY)
Admission: EM | Admit: 2014-10-03 | Discharge: 2014-10-07 | DRG: 041 | Disposition: A | Discharge status: Rehabilitation Facility | Payer: Medicare Other | Attending: General Surgery | Admitting: General Surgery

## 2014-10-03 ENCOUNTER — Other Ambulatory Visit (HOSPITAL_COMMUNITY): Payer: Medicare Other

## 2014-10-03 DIAGNOSIS — M1990 Unspecified osteoarthritis, unspecified site: Secondary | ICD-10-CM | POA: Diagnosis present

## 2014-10-03 DIAGNOSIS — I5032 Chronic diastolic (congestive) heart failure: Secondary | ICD-10-CM | POA: Diagnosis present

## 2014-10-03 DIAGNOSIS — S0003XA Contusion of scalp, initial encounter: Secondary | ICD-10-CM | POA: Diagnosis present

## 2014-10-03 DIAGNOSIS — E1165 Type 2 diabetes mellitus with hyperglycemia: Secondary | ICD-10-CM | POA: Diagnosis not present

## 2014-10-03 DIAGNOSIS — E785 Hyperlipidemia, unspecified: Secondary | ICD-10-CM | POA: Diagnosis present

## 2014-10-03 DIAGNOSIS — I129 Hypertensive chronic kidney disease with stage 1 through stage 4 chronic kidney disease, or unspecified chronic kidney disease: Secondary | ICD-10-CM | POA: Diagnosis present

## 2014-10-03 DIAGNOSIS — Y92017 Garden or yard in single-family (private) house as the place of occurrence of the external cause: Secondary | ICD-10-CM | POA: Diagnosis not present

## 2014-10-03 DIAGNOSIS — I442 Atrioventricular block, complete: Secondary | ICD-10-CM | POA: Diagnosis present

## 2014-10-03 DIAGNOSIS — S01511A Laceration without foreign body of lip, initial encounter: Secondary | ICD-10-CM | POA: Diagnosis present

## 2014-10-03 DIAGNOSIS — R55 Syncope and collapse: Secondary | ICD-10-CM | POA: Diagnosis not present

## 2014-10-03 DIAGNOSIS — Z880 Allergy status to penicillin: Secondary | ICD-10-CM

## 2014-10-03 DIAGNOSIS — S060X0D Concussion without loss of consciousness, subsequent encounter: Secondary | ICD-10-CM | POA: Diagnosis not present

## 2014-10-03 DIAGNOSIS — R269 Unspecified abnormalities of gait and mobility: Secondary | ICD-10-CM | POA: Diagnosis not present

## 2014-10-03 DIAGNOSIS — F0390 Unspecified dementia without behavioral disturbance: Secondary | ICD-10-CM | POA: Diagnosis not present

## 2014-10-03 DIAGNOSIS — J9600 Acute respiratory failure, unspecified whether with hypoxia or hypercapnia: Secondary | ICD-10-CM | POA: Diagnosis not present

## 2014-10-03 DIAGNOSIS — S060X0A Concussion without loss of consciousness, initial encounter: Secondary | ICD-10-CM | POA: Diagnosis not present

## 2014-10-03 DIAGNOSIS — Y93H9 Activity, other involving exterior property and land maintenance, building and construction: Secondary | ICD-10-CM | POA: Diagnosis not present

## 2014-10-03 DIAGNOSIS — S0190XA Unspecified open wound of unspecified part of head, initial encounter: Secondary | ICD-10-CM | POA: Diagnosis not present

## 2014-10-03 DIAGNOSIS — Z7982 Long term (current) use of aspirin: Secondary | ICD-10-CM | POA: Diagnosis not present

## 2014-10-03 DIAGNOSIS — Z7902 Long term (current) use of antithrombotics/antiplatelets: Secondary | ICD-10-CM | POA: Diagnosis not present

## 2014-10-03 DIAGNOSIS — R918 Other nonspecific abnormal finding of lung field: Secondary | ICD-10-CM | POA: Diagnosis not present

## 2014-10-03 DIAGNOSIS — S0993XA Unspecified injury of face, initial encounter: Secondary | ICD-10-CM | POA: Diagnosis not present

## 2014-10-03 DIAGNOSIS — I959 Hypotension, unspecified: Secondary | ICD-10-CM | POA: Diagnosis present

## 2014-10-03 DIAGNOSIS — S069X2A Unspecified intracranial injury with loss of consciousness of 31 minutes to 59 minutes, initial encounter: Secondary | ICD-10-CM | POA: Diagnosis not present

## 2014-10-03 DIAGNOSIS — S069X1A Unspecified intracranial injury with loss of consciousness of 30 minutes or less, initial encounter: Secondary | ICD-10-CM

## 2014-10-03 DIAGNOSIS — E1142 Type 2 diabetes mellitus with diabetic polyneuropathy: Secondary | ICD-10-CM | POA: Diagnosis present

## 2014-10-03 DIAGNOSIS — Z885 Allergy status to narcotic agent status: Secondary | ICD-10-CM | POA: Diagnosis not present

## 2014-10-03 DIAGNOSIS — Z950 Presence of cardiac pacemaker: Secondary | ICD-10-CM | POA: Diagnosis not present

## 2014-10-03 DIAGNOSIS — S06892D Other specified intracranial injury with loss of consciousness of 31 minutes to 59 minutes, subsequent encounter: Secondary | ICD-10-CM | POA: Diagnosis not present

## 2014-10-03 DIAGNOSIS — E1122 Type 2 diabetes mellitus with diabetic chronic kidney disease: Secondary | ICD-10-CM | POA: Diagnosis not present

## 2014-10-03 DIAGNOSIS — R40242 Glasgow coma scale score 9-12: Secondary | ICD-10-CM | POA: Diagnosis present

## 2014-10-03 DIAGNOSIS — I5030 Unspecified diastolic (congestive) heart failure: Secondary | ICD-10-CM | POA: Diagnosis not present

## 2014-10-03 DIAGNOSIS — M7981 Nontraumatic hematoma of soft tissue: Secondary | ICD-10-CM | POA: Diagnosis not present

## 2014-10-03 DIAGNOSIS — R42 Dizziness and giddiness: Secondary | ICD-10-CM | POA: Diagnosis not present

## 2014-10-03 DIAGNOSIS — Z48812 Encounter for surgical aftercare following surgery on the circulatory system: Secondary | ICD-10-CM | POA: Diagnosis not present

## 2014-10-03 DIAGNOSIS — S3991XA Unspecified injury of abdomen, initial encounter: Secondary | ICD-10-CM | POA: Diagnosis not present

## 2014-10-03 DIAGNOSIS — I2510 Atherosclerotic heart disease of native coronary artery without angina pectoris: Secondary | ICD-10-CM | POA: Diagnosis present

## 2014-10-03 DIAGNOSIS — S3993XA Unspecified injury of pelvis, initial encounter: Secondary | ICD-10-CM | POA: Diagnosis not present

## 2014-10-03 DIAGNOSIS — W1830XA Fall on same level, unspecified, initial encounter: Secondary | ICD-10-CM | POA: Diagnosis not present

## 2014-10-03 DIAGNOSIS — I252 Old myocardial infarction: Secondary | ICD-10-CM

## 2014-10-03 DIAGNOSIS — E1140 Type 2 diabetes mellitus with diabetic neuropathy, unspecified: Secondary | ICD-10-CM | POA: Diagnosis not present

## 2014-10-03 DIAGNOSIS — T1490XA Injury, unspecified, initial encounter: Secondary | ICD-10-CM

## 2014-10-03 DIAGNOSIS — Z9181 History of falling: Secondary | ICD-10-CM | POA: Diagnosis not present

## 2014-10-03 DIAGNOSIS — T1490 Injury, unspecified: Secondary | ICD-10-CM | POA: Diagnosis not present

## 2014-10-03 DIAGNOSIS — R001 Bradycardia, unspecified: Secondary | ICD-10-CM | POA: Diagnosis present

## 2014-10-03 DIAGNOSIS — W19XXXS Unspecified fall, sequela: Secondary | ICD-10-CM | POA: Diagnosis not present

## 2014-10-03 DIAGNOSIS — S069X2S Unspecified intracranial injury with loss of consciousness of 31 minutes to 59 minutes, sequela: Secondary | ICD-10-CM | POA: Diagnosis not present

## 2014-10-03 DIAGNOSIS — T148 Other injury of unspecified body region: Secondary | ICD-10-CM | POA: Diagnosis not present

## 2014-10-03 DIAGNOSIS — S199XXA Unspecified injury of neck, initial encounter: Secondary | ICD-10-CM | POA: Diagnosis not present

## 2014-10-03 DIAGNOSIS — W19XXXA Unspecified fall, initial encounter: Secondary | ICD-10-CM | POA: Insufficient documentation

## 2014-10-03 DIAGNOSIS — R4182 Altered mental status, unspecified: Secondary | ICD-10-CM | POA: Diagnosis not present

## 2014-10-03 DIAGNOSIS — N183 Chronic kidney disease, stage 3 (moderate): Secondary | ICD-10-CM | POA: Diagnosis not present

## 2014-10-03 DIAGNOSIS — S299XXA Unspecified injury of thorax, initial encounter: Secondary | ICD-10-CM | POA: Diagnosis not present

## 2014-10-03 HISTORY — DX: Heart failure, unspecified: I50.9

## 2014-10-03 HISTORY — DX: Benign paroxysmal vertigo, unspecified ear: H81.10

## 2014-10-03 LAB — PREPARE FRESH FROZEN PLASMA: UNIT DIVISION: 0

## 2014-10-03 LAB — URINALYSIS, ROUTINE W REFLEX MICROSCOPIC
Bilirubin Urine: NEGATIVE
GLUCOSE, UA: 250 mg/dL — AB
Ketones, ur: 15 mg/dL — AB
LEUKOCYTES UA: NEGATIVE
Nitrite: NEGATIVE
Protein, ur: 30 mg/dL — AB
Specific Gravity, Urine: 1.03 (ref 1.005–1.030)
Urobilinogen, UA: 0.2 mg/dL (ref 0.0–1.0)
pH: 6.5 (ref 5.0–8.0)

## 2014-10-03 LAB — I-STAT ARTERIAL BLOOD GAS, ED
ACID-BASE DEFICIT: 3 mmol/L — AB (ref 0.0–2.0)
BICARBONATE: 23.2 meq/L (ref 20.0–24.0)
O2 Saturation: 100 %
Patient temperature: 98.6
TCO2: 24 mmol/L (ref 0–100)
pCO2 arterial: 43.1 mmHg (ref 35.0–45.0)
pH, Arterial: 7.339 — ABNORMAL LOW (ref 7.350–7.450)
pO2, Arterial: 252 mmHg — ABNORMAL HIGH (ref 80.0–100.0)

## 2014-10-03 LAB — TROPONIN I: Troponin I: <0.3 ng/mL (ref <0.30)

## 2014-10-03 LAB — PROTIME-INR
INR: 1.06 (ref 0.00–1.49)
Prothrombin Time: 13.9 seconds (ref 11.6–15.2)

## 2014-10-03 LAB — I-STAT CHEM 8, ED
BUN: 22 mg/dL (ref 6–23)
CHLORIDE: 106 meq/L (ref 96–112)
CREATININE: 1.6 mg/dL — AB (ref 0.50–1.35)
Calcium, Ion: 1.1 mmol/L — ABNORMAL LOW (ref 1.13–1.30)
GLUCOSE: 168 mg/dL — AB (ref 70–99)
HCT: 50 % (ref 39.0–52.0)
Hemoglobin: 17 g/dL (ref 13.0–17.0)
Potassium: 3.3 mEq/L — ABNORMAL LOW (ref 3.7–5.3)
SODIUM: 140 meq/L (ref 137–147)
TCO2: 22 mmol/L (ref 0–100)

## 2014-10-03 LAB — CBC
HCT: 45.2 % (ref 39.0–52.0)
HEMATOCRIT: 45.4 % (ref 39.0–52.0)
HEMOGLOBIN: 15.9 g/dL (ref 13.0–17.0)
Hemoglobin: 16.1 g/dL (ref 13.0–17.0)
MCH: 31.3 pg (ref 26.0–34.0)
MCH: 31.3 pg (ref 26.0–34.0)
MCHC: 35.5 g/dL (ref 30.0–36.0)
MCHC: 35.2 g/dL (ref 30.0–36.0)
MCV: 88.2 fL (ref 78.0–100.0)
MCV: 89 fL (ref 78.0–100.0)
Platelets: 127 10*3/uL — ABNORMAL LOW (ref 150–400)
Platelets: 137 10*3/uL — ABNORMAL LOW (ref 150–400)
RBC: 5.15 MIL/uL (ref 4.22–5.81)
RBC: 5.08 MIL/uL (ref 4.22–5.81)
RDW: 12.7 % (ref 11.5–15.5)
RDW: 13 % (ref 11.5–15.5)
WBC: 8.2 10*3/uL (ref 4.0–10.5)
WBC: 10.5 10*3/uL (ref 4.0–10.5)

## 2014-10-03 LAB — MRSA PCR SCREENING: MRSA BY PCR: NEGATIVE

## 2014-10-03 LAB — COMPREHENSIVE METABOLIC PANEL
ALK PHOS: 51 U/L (ref 39–117)
ALT: 11 U/L (ref 0–53)
ANION GAP: 15 (ref 5–15)
AST: 19 U/L (ref 0–37)
Albumin: 3.4 g/dL — ABNORMAL LOW (ref 3.5–5.2)
BUN: 22 mg/dL (ref 6–23)
CHLORIDE: 103 meq/L (ref 96–112)
CO2: 22 mEq/L (ref 19–32)
Calcium: 8.8 mg/dL (ref 8.4–10.5)
Creatinine, Ser: 1.57 mg/dL — ABNORMAL HIGH (ref 0.50–1.35)
GFR calc non Af Amer: 40 mL/min — ABNORMAL LOW (ref >90)
GFR, EST AFRICAN AMERICAN: 46 mL/min — AB (ref >90)
GLUCOSE: 166 mg/dL — AB (ref 70–99)
Potassium: 3.5 mEq/L — ABNORMAL LOW (ref 3.7–5.3)
Sodium: 140 mEq/L (ref 137–147)
Total Bilirubin: 0.6 mg/dL (ref 0.3–1.2)
Total Protein: 6.8 g/dL (ref 6.0–8.3)

## 2014-10-03 LAB — CDS SEROLOGY

## 2014-10-03 LAB — GLUCOSE, CAPILLARY
Glucose-Capillary: 190 mg/dL — ABNORMAL HIGH (ref 70–99)
Glucose-Capillary: 138 mg/dL — ABNORMAL HIGH (ref 70–99)

## 2014-10-03 LAB — URINE MICROSCOPIC-ADD ON

## 2014-10-03 LAB — I-STAT CG4 LACTIC ACID, ED: Lactic Acid, Venous: 2.05 mmol/L (ref 0.5–2.2)

## 2014-10-03 LAB — TRIGLYCERIDES: TRIGLYCERIDES: 149 mg/dL (ref <150)

## 2014-10-03 LAB — ETHANOL

## 2014-10-03 LAB — ABO/RH: ABO/RH(D): B POS

## 2014-10-03 LAB — CBG MONITORING, ED: Glucose-Capillary: 155 mg/dL — ABNORMAL HIGH (ref 70–99)

## 2014-10-03 MED ORDER — FENTANYL CITRATE 0.05 MG/ML IJ SOLN: 50.0000 ug | INTRAMUSCULAR | Status: DC | PRN

## 2014-10-03 MED ORDER — ROCURONIUM BROMIDE 50 MG/5ML IV SOLN
INTRAVENOUS | Status: AC | PRN
  Administered 2014-10-03: 80 mg via INTRAVENOUS

## 2014-10-03 MED ORDER — FENTANYL CITRATE 0.05 MG/ML IJ SOLN
100.0000 ug | Freq: Once | INTRAMUSCULAR | Status: AC
  Administered 2014-10-03: 100 ug via INTRAVENOUS

## 2014-10-03 MED ORDER — INSULIN ASPART 100 UNIT/ML ~~LOC~~ SOLN
0.0000 [IU] | SUBCUTANEOUS | Status: DC
  Administered 2014-10-03: 2 [IU] via SUBCUTANEOUS
  Administered 2014-10-03 – 2014-10-04 (×2): 3 [IU] via SUBCUTANEOUS
  Administered 2014-10-04 (×2): 2 [IU] via SUBCUTANEOUS
  Administered 2014-10-04: 5 [IU] via SUBCUTANEOUS
  Administered 2014-10-05: 2 [IU] via SUBCUTANEOUS
  Administered 2014-10-05: 5 [IU] via SUBCUTANEOUS
  Administered 2014-10-05: 8 [IU] via SUBCUTANEOUS
  Administered 2014-10-05: 5 [IU] via SUBCUTANEOUS
  Administered 2014-10-05 – 2014-10-06 (×2): 3 [IU] via SUBCUTANEOUS
  Administered 2014-10-06: 2 [IU] via SUBCUTANEOUS
  Administered 2014-10-06: 3 [IU] via SUBCUTANEOUS
  Administered 2014-10-06: 5 [IU] via SUBCUTANEOUS
  Administered 2014-10-06: 3 [IU] via SUBCUTANEOUS
  Administered 2014-10-07 (×2): 5 [IU] via SUBCUTANEOUS
  Administered 2014-10-07: 3 [IU] via SUBCUTANEOUS
  Administered 2014-10-07: 5 [IU] via SUBCUTANEOUS

## 2014-10-03 MED ORDER — CETYLPYRIDINIUM CHLORIDE 0.05 % MT LIQD
7.0000 mL | Freq: Four times a day (QID) | OROMUCOSAL | Status: DC
  Administered 2014-10-03 – 2014-10-05 (×9): 7 mL via OROMUCOSAL

## 2014-10-03 MED ORDER — CHLORHEXIDINE GLUCONATE 0.12 % MT SOLN
15.0000 mL | Freq: Two times a day (BID) | OROMUCOSAL | Status: DC
  Administered 2014-10-03 – 2014-10-05 (×4): 15 mL via OROMUCOSAL
  Filled 2014-10-03 (×4): qty 15

## 2014-10-03 MED ORDER — PANTOPRAZOLE SODIUM 40 MG IV SOLR
40.0000 mg | Freq: Every day | INTRAVENOUS | Status: DC
  Administered 2014-10-03 – 2014-10-06 (×4): 40 mg via INTRAVENOUS
  Filled 2014-10-03 (×6): qty 40

## 2014-10-03 MED ORDER — FENTANYL CITRATE 0.05 MG/ML IJ SOLN
INTRAMUSCULAR | Status: AC
  Filled 2014-10-03: qty 2

## 2014-10-03 MED ORDER — NITROGLYCERIN 0.4 MG SL SUBL: 0.4000 mg | SUBLINGUAL_TABLET | SUBLINGUAL | Status: DC | PRN

## 2014-10-03 MED ORDER — PROPOFOL 10 MG/ML IV EMUL
0.0000 ug/kg/min | INTRAVENOUS | Status: DC
Start: 2014-10-03 — End: 2014-10-03

## 2014-10-03 MED ORDER — ONDANSETRON HCL 4 MG/2ML IJ SOLN: 4.0000 mg | Freq: Four times a day (QID) | INTRAMUSCULAR | Status: DC | PRN

## 2014-10-03 MED ORDER — SODIUM CHLORIDE 0.9 % IV BOLUS (SEPSIS)
500.0000 mL | Freq: Once | INTRAVENOUS | Status: AC
  Administered 2014-10-03: 500 mL via INTRAVENOUS

## 2014-10-03 MED ORDER — ETOMIDATE 2 MG/ML IV SOLN
INTRAVENOUS | Status: AC | PRN
  Administered 2014-10-03: 20 mg via INTRAVENOUS

## 2014-10-03 MED ORDER — ENOXAPARIN SODIUM 40 MG/0.4ML ~~LOC~~ SOLN
40.0000 mg | SUBCUTANEOUS | Status: DC
  Filled 2014-10-03: qty 0.4

## 2014-10-03 MED ORDER — PROPOFOL 10 MG/ML IV EMUL
5.0000 ug/kg/min | Freq: Once | INTRAVENOUS | Status: DC
  Administered 2014-10-03: 5 ug/kg/min via INTRAVENOUS
  Administered 2014-10-03: 25 ug/kg/min via INTRAVENOUS
  Administered 2014-10-03: 10 ug/kg/min via INTRAVENOUS

## 2014-10-03 MED ORDER — TETANUS-DIPHTH-ACELL PERTUSSIS 5-2.5-18.5 LF-MCG/0.5 IM SUSP
0.5000 mL | Freq: Once | INTRAMUSCULAR | Status: AC
  Administered 2014-10-03: 0.5 mL via INTRAMUSCULAR
  Filled 2014-10-03: qty 0.5

## 2014-10-03 MED ORDER — PANTOPRAZOLE SODIUM 40 MG PO TBEC
40.0000 mg | DELAYED_RELEASE_TABLET | Freq: Every day | ORAL | Status: DC
  Administered 2014-10-07: 40 mg via ORAL
  Filled 2014-10-03: qty 1

## 2014-10-03 MED ORDER — SODIUM CHLORIDE 0.9 % IV SOLN
20.0000 ug/h | INTRAVENOUS | Status: DC
  Administered 2014-10-03: 25 ug/h via INTRAVENOUS
  Filled 2014-10-03: qty 50

## 2014-10-03 MED ORDER — ROSUVASTATIN CALCIUM 40 MG PO TABS: 40.0000 mg | ORAL_TABLET | Freq: Every day | ORAL | Status: DC

## 2014-10-03 MED ORDER — ONDANSETRON HCL 4 MG PO TABS: 4.0000 mg | ORAL_TABLET | Freq: Four times a day (QID) | ORAL | Status: DC | PRN

## 2014-10-03 MED ORDER — POTASSIUM CHLORIDE IN NACL 20-0.9 MEQ/L-% IV SOLN
INTRAVENOUS | Status: DC
  Administered 2014-10-03: 16:00:00 via INTRAVENOUS
  Administered 2014-10-04: 100 mL/h via INTRAVENOUS
  Administered 2014-10-04 – 2014-10-06 (×5): via INTRAVENOUS
  Filled 2014-10-03 (×9): qty 1000

## 2014-10-03 MED ORDER — MIDAZOLAM HCL 2 MG/2ML IJ SOLN: 1.0000 mg | INTRAMUSCULAR | Status: DC | PRN

## 2014-10-03 MED ORDER — MIDAZOLAM HCL 2 MG/2ML IJ SOLN
1.0000 mg | INTRAMUSCULAR | Status: DC | PRN
  Administered 2014-10-04: 1 mg via INTRAVENOUS
  Filled 2014-10-03: qty 2

## 2014-10-03 NOTE — ED Provider Notes (Signed)
CSN: [id number]     Arrival date & time 10/03/14  1111 History   First MD Initiated Contact with Patient 10/03/14 1129     No chief complaint on file.    (Consider location/radiation/quality/duration/timing/severity/associated sxs/prior Treatment) HPI Comments: Patient brought as level 1 trauma for fall today. On blood thinners per family. Was found next to tractor today. Per family fallen twice today. Endorsing dizziness. On meclizine.   Patient is a 80 y.o. male presenting with fall. The history is provided by the EMS personnel. No language interpreter was used.  Fall This is a new problem. The current episode started today. The problem occurs rarely. The problem has been unchanged. Associated symptoms include vertigo. Associated symptoms comments: dizziness. Nothing aggravates the symptoms. He has tried nothing for the symptoms. The treatment provided no relief.    Past Medical History  Diagnosis Date  . Diabetes mellitus without complication    No past surgical history on file. No family history on file. History  Substance Use Topics  . Smoking status: Not on file  . Smokeless tobacco: Not on file  . Alcohol Use: Not on file    Review of Systems  Unable to perform ROS: Patient nonverbal  Neurological: Positive for vertigo.      Allergies  Review of patient's allergies indicates not on file.  Home Medications   Prior to Admission medications   Not on File   BP 168/90 mmHg  Pulse 111  Resp 12  Ht 6' 0.5" (1.842 m)  Wt 205 lb (92.987 kg)  BMI 27.41 kg/m2  SpO2 100% Physical Exam  Nursing note and vitals reviewed. General: well nourished, well hydrated, no acute distress Head: no midface instability, laceration to right inner lip, hematoma to right forehead Eyes: conjunctivae and lids normal; pupils equal, round, reactive to light Neck: supple, no masses, trachea midline. C-collar: on Spine: No cervical, thoracic, lumbar tenderness. Normal rectal tone.   Respiratory: Trachea midline,no intercostal retractions or use of accessory muscles, clear to auscultation bilaterally Cardiovascular: Nml S1, S2, no murmur, rub, or gallop, radial pulses 2+, symmetric Gastrointestinal: Abdomen soft, non-tender, non-distended, no masses, bowel sounds normal. No rectal bleeding.  Decreased tone Extremities: Free passive ROM, no cyanosis, clubbing or edema, moves all 4 extremities Mental Status: not withdrawing to pain, occasionally moving 4 extremities, follows limited commands,   ED Course  INTUBATION Date/Time: 10/03/2014 11:36 AM Performed by: Joanell Rising Authorized by: Mariea Clonts Consent: The procedure was performed in an emergent situation. Patient identity confirmed: arm band and hospital-assigned identification number Indications: airway protection Intubation method: video-assisted Patient status: paralyzed (RSI) Sedatives: etomidate Paralytic: rocuronium Tube size: 8.0 mm Tube type: cuffed Number of attempts: 1 Cords visualized: yes Post-procedure assessment: chest rise,  ETCO2 monitor and CO2 detector Breath sounds: equal Cuff inflated: yes ETT to lip: 25 cm Tube secured with: ETT holder Chest x-ray interpreted by me. Chest x-ray findings: endotracheal tube in appropriate position Patient tolerance: Patient tolerated the procedure well with no immediate complications   (including critical care time) Labs Review Labs Reviewed  COMPREHENSIVE METABOLIC PANEL - Abnormal; Notable for the following:    Potassium 3.5 (*)    Glucose, Bld 166 (*)    Creatinine, Ser 1.57 (*)    Albumin 3.4 (*)    GFR calc non Af Amer 40 (*)    GFR calc Af Amer 46 (*)    All other components within normal limits  CBC - Abnormal; Notable for the following:  Platelets 127 (*)    All other components within normal limits  GLUCOSE, CAPILLARY - Abnormal; Notable for the following:    Glucose-Capillary 190 (*)    All other components within  normal limits  I-STAT CHEM 8, ED - Abnormal; Notable for the following:    Potassium 3.3 (*)    Creatinine, Ser 1.60 (*)    Glucose, Bld 168 (*)    Calcium, Ion 1.10 (*)    All other components within normal limits  CBG MONITORING, ED - Abnormal; Notable for the following:    Glucose-Capillary 155 (*)    All other components within normal limits  I-STAT ARTERIAL BLOOD GAS, ED - Abnormal; Notable for the following:    pH, Arterial 7.339 (*)    pO2, Arterial 252.0 (*)    Acid-base deficit 3.0 (*)    All other components within normal limits  MRSA PCR SCREENING  ETHANOL  PROTIME-INR  TROPONIN I  CDS SEROLOGY  URINALYSIS, ROUTINE W REFLEX MICROSCOPIC  BLOOD GAS, ARTERIAL  BLOOD GAS, ARTERIAL  TRIGLYCERIDES  I-STAT CG4 LACTIC ACID, ED  I-STAT CHEM 8, ED  TYPE AND SCREEN  PREPARE FRESH FROZEN PLASMA  ABO/RH    Imaging Review Ct Head Wo Contrast  10/03/2014   CLINICAL DATA:  Level 1 trauma.  Fell and hit head on tractor.  EXAM: CT HEAD WITHOUT CONTRAST  CT MAXILLOFACIAL WITHOUT CONTRAST  CT CERVICAL SPINE WITHOUT CONTRAST  TECHNIQUE: Multidetector CT imaging of the head, cervical spine, and maxillofacial structures were performed using the standard protocol without intravenous contrast. Multiplanar CT image reconstructions of the cervical spine and maxillofacial structures were also generated.  COMPARISON:  None.  FINDINGS: CT HEAD FINDINGS  There is no intra or extra-axial fluid collection or mass lesion. The basilar cisterns and ventricles have a normal appearance. There is no CT evidence for acute infarction or hemorrhage. There is central and cortical atrophy. Mild periventricular white matter changes are consistent with small vessel disease.  Bone windows show right frontal scalp hematoma without associated underlying calvarial fracture. There is mild mucoperiosteal thickening of the ethmoid and axillary sinuses.  CT MAXILLOFACIAL FINDINGS  The globes, orbits are intact. The zygomatic  arches, mandible, and temporomandibular joints are intact. Nasal bones and bony nasal septum are intact. Pterygoid plates and skullbase are intact.  Patient has endotracheal tube. Nasogastric tube is coiled in the patient's mass. There are changes of chronic sinusitis involving the ethmoid and maxillary sinuses. No air-fluid levels are identified within the paranasal sinuses. No mastoid effusions. There is cerumen within the right external auditory canal.  CT CERVICAL SPINE FINDINGS  There is normal alignment of the cervical spine. There is no evidence for acute fracture or dislocation. Prevertebral soft tissues have a normal appearance. Lung apices have a normal appearance. There is atherosclerotic calcification of the carotid arteries.  IMPRESSION: 1. Atrophy and small vessel disease. 2.  No evidence for acute intracranial abnormality. 3. Right frontal scalp hematoma. 4. Chronic sinusitis. 5. Orogastric tube coiled within the patient's mouth. 6. No evidence for acute fracture of the maxillofacial bones. 7. Degenerative changes without evidence for acute abnormality of the cervical spine.   Electronically Signed   By: Shon Hale M.D.   On: 10/03/2014 12:48   Ct Chest W Contrast  10/03/2014   CLINICAL DATA:  Level 1 trauma. The patient fell and hit head on tractor. Right frontal scalp hematoma.  EXAM: CT CHEST, ABDOMEN, AND PELVIS WITH CONTRAST  TECHNIQUE: Multidetector CT imaging of  the chest, abdomen and pelvis was performed following the standard protocol during bolus administration of intravenous contrast.  CONTRAST:  80 cc Omnipaque 300  COMPARISON:  Chest x-ray and AP pelvis on 10/03/2014  FINDINGS: CT CHEST FINDINGS  Heart: The heart is mildly enlarged. No pericardial effusion. There is dense atherosclerotic calcification of the coronary arteries.  Vascular structures: The aorta is tortuous and calcified. It ascending aorta is ectatic, measuring up to 3 cm in diameter. No evidence for vascular injury.   Mediastinum/thyroid: No mediastinal, hilar, or axillary adenopathy. Endotracheal tube is in place with tip above the carina. Nasogastric tube is in place to the stomach.  Lungs/Airways: There is bibasilar dependent change. No pleural effusions. No pneumothorax. Prior right shoulder tenderness repair.  Chest wall/osseous structures: No acute displaced fractures are identified. There is moderate degenerative change throughout the thoracic spine. Sternum is intact.  CT ABDOMEN AND PELVIS FINDINGS  Upper abdomen: No focal abnormality identified within the liver, spleen, pancreas, or adrenal glands. The kidneys show symmetric excretion bilaterally without focal mass. Gallbladder is present.  Gastrointestinal tract: And the stomach contains nasogastric tube. Small bowel loops are normal in appearance. There are scattered colonic diverticula but no evidence for acute diverticulitis or colonic injury. The appendix is well seen and has a normal appearance.  Pelvis: Patient has a penile prosthesis. The urinary bladder has a normal appearance. Prostatic calcifications are present. No free pelvic fluid.  Retroperitoneum: No retroperitoneal or mesenteric adenopathy. There is dense atherosclerotic calcification of the abdominal aorta. No aneurysm or dissection.  Abdominal wall: No acute abnormality.  Osseous structures: There is an old wedge compression fracture of L2 with significant loss of height. Significant osteophytes are identified throughout the lower thoracic and lumbar spine. No suspicious lytic or blastic lesions are identified.  IMPRESSION: 1. Cardiomegaly and dense atherosclerotic calcification of the coronary arteries and aorta. 2. No evidence for vascular injury. 3. Dependent change within the lungs bilaterally. 4. No evidence for acute displaced fracture. 5. No evidence for injury of the solid organs or bowel. 6. Nasogastric tube to the stomach. 7. Endotracheal to to the lower trachea. 8. Old wedge compression  fracture of L2.   Electronically Signed   By: Shon Hale M.D.   On: 10/03/2014 12:42   Ct Cervical Spine Wo Contrast  10/03/2014   CLINICAL DATA:  Level 1 trauma.  Fell and hit head on tractor.  EXAM: CT HEAD WITHOUT CONTRAST  CT MAXILLOFACIAL WITHOUT CONTRAST  CT CERVICAL SPINE WITHOUT CONTRAST  TECHNIQUE: Multidetector CT imaging of the head, cervical spine, and maxillofacial structures were performed using the standard protocol without intravenous contrast. Multiplanar CT image reconstructions of the cervical spine and maxillofacial structures were also generated.  COMPARISON:  None.  FINDINGS: CT HEAD FINDINGS  There is no intra or extra-axial fluid collection or mass lesion. The basilar cisterns and ventricles have a normal appearance. There is no CT evidence for acute infarction or hemorrhage. There is central and cortical atrophy. Mild periventricular white matter changes are consistent with small vessel disease.  Bone windows show right frontal scalp hematoma without associated underlying calvarial fracture. There is mild mucoperiosteal thickening of the ethmoid and axillary sinuses.  CT MAXILLOFACIAL FINDINGS  The globes, orbits are intact. The zygomatic arches, mandible, and temporomandibular joints are intact. Nasal bones and bony nasal septum are intact. Pterygoid plates and skullbase are intact.  Patient has endotracheal tube. Nasogastric tube is coiled in the patient's mass. There are changes of chronic sinusitis involving  the ethmoid and maxillary sinuses. No air-fluid levels are identified within the paranasal sinuses. No mastoid effusions. There is cerumen within the right external auditory canal.  CT CERVICAL SPINE FINDINGS  There is normal alignment of the cervical spine. There is no evidence for acute fracture or dislocation. Prevertebral soft tissues have a normal appearance. Lung apices have a normal appearance. There is atherosclerotic calcification of the carotid arteries.  IMPRESSION: 1.  Atrophy and small vessel disease. 2.  No evidence for acute intracranial abnormality. 3. Right frontal scalp hematoma. 4. Chronic sinusitis. 5. Orogastric tube coiled within the patient's mouth. 6. No evidence for acute fracture of the maxillofacial bones. 7. Degenerative changes without evidence for acute abnormality of the cervical spine.   Electronically Signed   By: Shon Hale M.D.   On: 10/03/2014 12:48   Ct Abdomen Pelvis W Contrast  10/03/2014   CLINICAL DATA:  Level 1 trauma. The patient fell and hit head on tractor. Right frontal scalp hematoma.  EXAM: CT CHEST, ABDOMEN, AND PELVIS WITH CONTRAST  TECHNIQUE: Multidetector CT imaging of the chest, abdomen and pelvis was performed following the standard protocol during bolus administration of intravenous contrast.  CONTRAST:  80 cc Omnipaque 300  COMPARISON:  Chest x-ray and AP pelvis on 10/03/2014  FINDINGS: CT CHEST FINDINGS  Heart: The heart is mildly enlarged. No pericardial effusion. There is dense atherosclerotic calcification of the coronary arteries.  Vascular structures: The aorta is tortuous and calcified. It ascending aorta is ectatic, measuring up to 3 cm in diameter. No evidence for vascular injury.  Mediastinum/thyroid: No mediastinal, hilar, or axillary adenopathy. Endotracheal tube is in place with tip above the carina. Nasogastric tube is in place to the stomach.  Lungs/Airways: There is bibasilar dependent change. No pleural effusions. No pneumothorax. Prior right shoulder tenderness repair.  Chest wall/osseous structures: No acute displaced fractures are identified. There is moderate degenerative change throughout the thoracic spine. Sternum is intact.  CT ABDOMEN AND PELVIS FINDINGS  Upper abdomen: No focal abnormality identified within the liver, spleen, pancreas, or adrenal glands. The kidneys show symmetric excretion bilaterally without focal mass. Gallbladder is present.  Gastrointestinal tract: And the stomach contains nasogastric  tube. Small bowel loops are normal in appearance. There are scattered colonic diverticula but no evidence for acute diverticulitis or colonic injury. The appendix is well seen and has a normal appearance.  Pelvis: Patient has a penile prosthesis. The urinary bladder has a normal appearance. Prostatic calcifications are present. No free pelvic fluid.  Retroperitoneum: No retroperitoneal or mesenteric adenopathy. There is dense atherosclerotic calcification of the abdominal aorta. No aneurysm or dissection.  Abdominal wall: No acute abnormality.  Osseous structures: There is an old wedge compression fracture of L2 with significant loss of height. Significant osteophytes are identified throughout the lower thoracic and lumbar spine. No suspicious lytic or blastic lesions are identified.  IMPRESSION: 1. Cardiomegaly and dense atherosclerotic calcification of the coronary arteries and aorta. 2. No evidence for vascular injury. 3. Dependent change within the lungs bilaterally. 4. No evidence for acute displaced fracture. 5. No evidence for injury of the solid organs or bowel. 6. Nasogastric tube to the stomach. 7. Endotracheal to to the lower trachea. 8. Old wedge compression fracture of L2.   Electronically Signed   By: Shon Hale M.D.   On: 10/03/2014 12:42   Dg Pelvis Portable  10/03/2014   CLINICAL DATA:  Fall from tractor.  Trauma  EXAM: PORTABLE PELVIS 1-2 VIEWS  COMPARISON:  None.  FINDINGS: There is no evidence of pelvic fracture or diastasis. No pelvic bone lesions are seen. Penile prosthesis  IMPRESSION: Negative.   Electronically Signed   By: Franchot Gallo M.D.   On: 10/03/2014 11:49   Dg Chest Portable 1 View  10/03/2014   CLINICAL DATA:  80 year old male status post fall from tractor. Level 1 trauma. Initial encounter.  EXAM: PORTABLE CHEST - 1 VIEW  COMPARISON:  None.  FINDINGS: Portable AP supine view at 1123 hrs. Intubated. Endotracheal tube tip in good position just below the clavicles. Mildly  rotated to the right. Normal cardiac size and mediastinal contours. No pneumothorax, pleural effusion, or pulmonary contusion identified on this supine view. No acute osseous injury identified in the visible thorax.  IMPRESSION: 1. Intubated, ET tube in good position. 2.  No acute traumatic injury identified.   Electronically Signed   By: Lars Pinks M.D.   On: 10/03/2014 11:49   Ct Maxillofacial Wo Cm  10/03/2014   CLINICAL DATA:  Level 1 trauma.  Fell and hit head on tractor.  EXAM: CT HEAD WITHOUT CONTRAST  CT MAXILLOFACIAL WITHOUT CONTRAST  CT CERVICAL SPINE WITHOUT CONTRAST  TECHNIQUE: Multidetector CT imaging of the head, cervical spine, and maxillofacial structures were performed using the standard protocol without intravenous contrast. Multiplanar CT image reconstructions of the cervical spine and maxillofacial structures were also generated.  COMPARISON:  None.  FINDINGS: CT HEAD FINDINGS  There is no intra or extra-axial fluid collection or mass lesion. The basilar cisterns and ventricles have a normal appearance. There is no CT evidence for acute infarction or hemorrhage. There is central and cortical atrophy. Mild periventricular white matter changes are consistent with small vessel disease.  Bone windows show right frontal scalp hematoma without associated underlying calvarial fracture. There is mild mucoperiosteal thickening of the ethmoid and axillary sinuses.  CT MAXILLOFACIAL FINDINGS  The globes, orbits are intact. The zygomatic arches, mandible, and temporomandibular joints are intact. Nasal bones and bony nasal septum are intact. Pterygoid plates and skullbase are intact.  Patient has endotracheal tube. Nasogastric tube is coiled in the patient's mass. There are changes of chronic sinusitis involving the ethmoid and maxillary sinuses. No air-fluid levels are identified within the paranasal sinuses. No mastoid effusions. There is cerumen within the right external auditory canal.  CT CERVICAL  SPINE FINDINGS  There is normal alignment of the cervical spine. There is no evidence for acute fracture or dislocation. Prevertebral soft tissues have a normal appearance. Lung apices have a normal appearance. There is atherosclerotic calcification of the carotid arteries.  IMPRESSION: 1. Atrophy and small vessel disease. 2.  No evidence for acute intracranial abnormality. 3. Right frontal scalp hematoma. 4. Chronic sinusitis. 5. Orogastric tube coiled within the patient's mouth. 6. No evidence for acute fracture of the maxillofacial bones. 7. Degenerative changes without evidence for acute abnormality of the cervical spine.   Electronically Signed   By: Shon Hale M.D.   On: 10/03/2014 12:48     EKG Interpretation None      MDM   Final diagnoses:  Trauma    11:35 AM Pt is a 80 y.o. male with pertinent PMHX of DM, CHF, HLD, shingles, BPPV, CAD who presents to the ED with level 1 fall. Found unresponsive away from tractor. On blood thinners unsure of which. Per EMS grabbing, but not withdrawing to pain. Laceration to lip and hematoma to forehead  On arrival: not withdrawing to pain, following some commands. No spontanous speech. Concern for  need for airway protection.   Plan for full trauma scans and trauma labs  Collateral per family: patient feeling dizziness recently started on meclizine. Took dose of insulin this morning for elevated blood glucose. No chest pain. 2 falls. Mechanical fall today. Feeling dizzy today was going to house and fell striking mouth on tractor. On brelenta. Will add on troponin  CXR AP portable per my read showed good position of ETT no rib fractures or PTX  XR pelvis AP no evidence of acute fracture or dislocation  Review of labs: ABG: 7.339/43.1/252.0/23.2: decreased FiO2 Troponin:<0.30 istat lactic acid: 2.05 CMP: hypokalemia, no elevated LFT, no elevated LFTs, Cr 1.57 INR 1.06  CT head wo contrast no evidence of acute hemorrhage, right frontal scalp  hematoma  Ct cervical spine wo contrast no evidence of acute fracture  CT chest abdomen pelvis w contrast no intra abdominal or chest injury. No vascular injury.  No rib fractures  Ct face wo contrast no evidence of acute fracture  Plan per trauma for admission for possible concussion syndrome  Labs and imaging reviewed by myself and considered in medical decision making if ordered.  Imaging interpreted by radiology. Pt was discussed with my attending, Dr. Walden Field, MD 10/03/14 1638  Mariea Clonts, MD 10/03/14 1640

## 2014-10-03 NOTE — Progress Notes (Signed)
Pt intubated by ED MD.  Placed on 8 ml/kg VT until vent orders are rec'd.  Pt taken to/from CT via vent.  ED RT aware.

## 2014-10-03 NOTE — ED Notes (Signed)
Family at beside. Family given emotional support.

## 2014-10-03 NOTE — ED Notes (Signed)
Pt to CT accompanied by RN, Trauma MD's and RT.

## 2014-10-03 NOTE — Plan of Care (Signed)
Problem: Consults Goal: Diabetes Guidelines if Diabetic/Glucose > 140 If diabetic or lab glucose is > 140 mg/dl - Initiate Diabetes/Hyperglycemia Guidelines & Document Interventions  Outcome: Completed/Met Date Met:  10/03/14  Problem: Phase I Progression Outcomes Goal: Adequate ventilation/oxygenation Outcome: Completed/Met Date Met:  10/03/14 Goal: Hemodynamically stable Outcome: Completed/Met Date Met:  10/03/14 Goal: Bedrest HOB at 30 degrees Outcome: Completed/Met Date Met:  10/03/14

## 2014-10-03 NOTE — Consult Note (Signed)
CONSULTATION NOTE   (PLEASE NOTE THE PATIENT WAS ADMITTED UNDER A DIFFERENT MRN - HE ALSO HAS AN MRN OF [id number])  Reason for Consult: Bradycardia, hypotension, syncope, fall   Requesting Physician: Dr. Grandville Silos  Cardiologist: Dr. Mare Ferrari   HPI: This is a 80 y.o. male with a past medical history significant for CAD, DM2, ?congestive heart failure, dyslipidemia, history of MI. On 08/06/2012 the patient had a STEMI with DES to the mid LAD. He is on Brilinta and a baby aspirin daily. His other issues include CKD, HTN, DM, HLD, OA, urinary incontinence, glaucoma, melanoma and thrombocytopenia. He was just seen in the office by Dr. Mare Ferrari and denied recurrent angina. The patient has a history of chronic right-sided chest discomfort secondary to postherpetic neuralgia. He had his episode of shingles about 22 months ago. Since last visit he has not been experiencing any definite recurrent angina pectoris. He is diabetic. He has been having occasional hypoglycemic episodes. He has issues with poor balance. He walks with a cane. He has occasional episodes of vertigo with the room spinning around. He was advised to get meclizine to help with his balance.  Today he had 2 episodes leading to falls, with questionable dizziness but what sounds like true syncope.  This lead to head trauma, including a scalp hematoma, but fortunately, no evidence for subdural or intracranial hemorrhage. Due to a low GCS he was intubated for airway protection.  He has been noted to have episodes of what appears to be junctional bradycardia in the neuro ICU with rates in the 40's and accompanying hypotension.  He was on propfol at the time, which was stopped.   PMHx:  Past Medical History  Diagnosis Date  . Diabetes mellitus without complication   . CHF (congestive heart failure)   . Postherpetic neuralgia   . Dyslipidemia   . Ischemic heart disease   . Benign positional vertigo   . Shingles   . Myocardial  infarction   . Coronary artery disease   . Arthritis   . Cancer    Past Surgical History  Procedure Laterality Date  . Penile prosthesis implant    . Eye surgery    . Joint replacement    . Fracture surgery      foot, pin placement    FAMHx: Family History  Problem Relation Age of Onset  . Stroke Mother   . Diabetes Mother   . Cancer Father   . Diabetes Brother     SOCHx:  reports that he has never smoked. He has never used smokeless tobacco. He reports that he does not drink alcohol or use illicit drugs.  ALLERGIES: Allergies  Allergen Reactions  . Januvia [Sitagliptin]   . Metformin And Related   . Oxycodone   . Penicillins     ROS: Review of systems not obtained due to patient factors.  HOME MEDICATIONS: Prescriptions prior to admission  Medication Sig Dispense Refill Last Dose  . acetaminophen (TYLENOL) 500 MG tablet Take 500 mg by mouth every 6 (six) hours as needed for mild pain.   Past Week at Unknown time  . amLODipine (NORVASC) 5 MG tablet Take 5 mg by mouth daily.   10/03/2014 at Unknown time  . aspirin 81 MG tablet Take 81 mg by mouth daily.   10/02/2014 at Unknown time  . cyanocobalamin (,VITAMIN B-12,) 1000 MCG/ML injection Inject 1,000 mcg into the muscle once.   Past Month at Unknown time  . insulin lispro (HUMALOG) 100 UNIT/ML  injection Inject 2-10 Units into the skin once. SLIDING SCALE: 150-200=2units, 201-250=4units, 251-300=6units, 301-350=8units, >350=10units   10/02/2014 at Unknown time  . isosorbide mononitrate (IMDUR) 30 MG 24 hr tablet Take 30 mg by mouth daily.   10/03/2014 at Unknown time  . meclizine (ANTIVERT) 25 MG tablet Take 25 mg by mouth every 6 (six) hours as needed for dizziness.   10/03/2014 at Unknown time  . nitroGLYCERIN (NITROSTAT) 0.4 MG SL tablet Place 0.4 mg under the tongue every 5 (five) minutes as needed for chest pain.   as needed  . rosuvastatin (CRESTOR) 40 MG tablet Take 40 mg by mouth daily.   10/02/2014 at Unknown time    . sertraline (ZOLOFT) 25 MG tablet Take 25 mg by mouth daily.   10/03/2014 at Unknown time  . tamsulosin (FLOMAX) 0.4 MG CAPS capsule Take 0.4 mg by mouth.   10/03/2014 at Unknown time  . ticagrelor (BRILINTA) 90 MG TABS tablet Take 90 mg by mouth 2 (two) times daily.   10/03/2014 at Unknown time  . vitamin B-12 (CYANOCOBALAMIN) 1000 MCG tablet Take 1,000 mcg by mouth every 30 (thirty) days.       HOSPITAL MEDICATIONS: Scheduled: . antiseptic oral rinse  7 mL Mouth Rinse QID  . chlorhexidine  15 mL Mouth Rinse BID  . fentaNYL      . insulin aspart  0-15 Units Subcutaneous 6 times per day  . pantoprazole  40 mg Oral Daily   Or  . pantoprazole (PROTONIX) IV  40 mg Intravenous Daily    VITALS: Blood pressure 130/59, pulse 61, temperature 97.8 F (36.6 C), temperature source Axillary, resp. rate 19, height 5' 10" (1.778 m), weight 207 lb 0.2 oz (93.9 kg), SpO2 98 %.  PHYSICAL EXAM: General appearance: awakens to voice, intubated, lightly sedated on vent Neck: no carotid bruit and no JVD Lungs: diminished breath sounds bilaterally Heart: regular rate and rhythm Abdomen: soft, non-tender; bowel sounds normal; no masses,  no organomegaly Extremities: large right frontal scalp hematoma Pulses: 2+ and symmetric Skin: warm, dry, diffuse ecchymosis on forehead Neurologic: Mental status: Awakens to voice, follows commands .  LABS: Results for orders placed or performed during the hospital encounter of 10/03/14 (from the past 48 hour(s))  Type and screen     Status: None   Collection Time: 10/03/14 11:10 AM  Result Value Ref Range   ABO/RH(D) B POS    Antibody Screen NEG    Sample Expiration 10/06/2014    Unit Number [id number]    Blood Component Type RED CELLS,LR    Unit division 00    Status of Unit REL FROM [facility]    Unit tag comment VERBAL ORDERS PER DR ZAVITZ    Transfusion Status OK TO TRANSFUSE    Crossmatch Result NOT NEEDED    Unit Number [id number]    Blood Component  Type RED CELLS,LR    Unit division 00    Status of Unit REL FROM [facility]    Unit tag comment VERBAL ORDERS PER DR ZAVITZ    Transfusion Status OK TO TRANSFUSE    Crossmatch Result NOT NEEDED   Prepare fresh frozen plasma     Status: None   Collection Time: 10/03/14 11:10 AM  Result Value Ref Range   Unit Number [id number]    Blood Component Type THW PLS APHR    Unit division A0    Status of Unit REL FROM [facility]    Unit tag comment VERBAL ORDERS PER DR Reather Converse  Transfusion Status OK TO TRANSFUSE    Unit Number [id number]    Blood Component Type LIQ PLASMA    Unit division 00    Status of Unit REL FROM [facility]    Unit tag comment VERBAL ORDERS PER DR ZAVITZ    Transfusion Status OK TO TRANSFUSE   ABO/Rh     Status: None   Collection Time: 10/03/14 11:10 AM  Result Value Ref Range   ABO/RH(D) B POS   I-stat chem 8, ed     Status: Abnormal   Collection Time: 10/03/14 11:26 AM  Result Value Ref Range   Sodium 140 137 - 147 mEq/L   Potassium 3.3 (L) 3.7 - 5.3 mEq/L   Chloride 106 96 - 112 mEq/L   BUN 22 6 - 23 mg/dL   Creatinine, Ser 1.60 (H) 0.50 - 1.35 mg/dL   Glucose, Bld 168 (H) 70 - 99 mg/dL   Calcium, Ion 1.10 (L) 1.13 - 1.30 mmol/L   TCO2 22 0 - 100 mmol/L   Hemoglobin 17.0 13.0 - 17.0 g/dL   HCT 50.0 39.0 - 52.0 %  CDS serology     Status: None   Collection Time: 10/03/14 11:33 AM  Result Value Ref Range   CDS serology specimen      SPECIMEN WILL BE HELD FOR 14 DAYS IF TESTING IS REQUIRED  Comprehensive metabolic panel     Status: Abnormal   Collection Time: 10/03/14 11:33 AM  Result Value Ref Range   Sodium 140 137 - 147 mEq/L   Potassium 3.5 (L) 3.7 - 5.3 mEq/L   Chloride 103 96 - 112 mEq/L   CO2 22 19 - 32 mEq/L   Glucose, Bld 166 (H) 70 - 99 mg/dL   BUN 22 6 - 23 mg/dL   Creatinine, Ser 1.57 (H) 0.50 - 1.35 mg/dL   Calcium 8.8 8.4 - 10.5 mg/dL   Total Protein 6.8 6.0 - 8.3 g/dL   Albumin 3.4 (L) 3.5 - 5.2 g/dL   AST 19 0 - 37 U/L   ALT 11 0 - 53 U/L    Alkaline Phosphatase 51 39 - 117 U/L   Total Bilirubin 0.6 0.3 - 1.2 mg/dL   GFR calc non Af Amer 40 (L) >90 mL/min   GFR calc Af Amer 46 (L) >90 mL/min    Comment: (NOTE) The eGFR has been calculated using the CKD EPI equation. This calculation has not been validated in all clinical situations. eGFR's persistently <90 mL/min signify possible Chronic Kidney Disease.    Anion gap 15 5 - 15  CBC     Status: Abnormal   Collection Time: 10/03/14 11:33 AM  Result Value Ref Range   WBC 8.2 4.0 - 10.5 K/uL   RBC 5.15 4.22 - 5.81 MIL/uL   Hemoglobin 16.1 13.0 - 17.0 g/dL   HCT 45.4 39.0 - 52.0 %   MCV 88.2 78.0 - 100.0 fL   MCH 31.3 26.0 - 34.0 pg   MCHC 35.5 30.0 - 36.0 g/dL   RDW 12.7 11.5 - 15.5 %   Platelets 127 (L) 150 - 400 K/uL  Ethanol     Status: None   Collection Time: 10/03/14 11:33 AM  Result Value Ref Range   Alcohol, Ethyl (B) <11 0 - 11 mg/dL    Comment:        LOWEST DETECTABLE LIMIT FOR SERUM ALCOHOL IS 11 mg/dL FOR MEDICAL PURPOSES ONLY   Protime-INR     Status: None   Collection Time:  10/03/14 11:33 AM  Result Value Ref Range   Prothrombin Time 13.9 11.6 - 15.2 seconds   INR 1.06 0.00 - 1.49  I-Stat CG4 Lactic Acid, ED     Status: None   Collection Time: 10/03/14 11:46 AM  Result Value Ref Range   Lactic Acid, Venous 2.05 0.5 - 2.2 mmol/L  CBG monitoring, ED     Status: Abnormal   Collection Time: 10/03/14 12:30 PM  Result Value Ref Range   Glucose-Capillary 155 (H) 70 - 99 mg/dL  Troponin I     Status: None   Collection Time: 10/03/14  1:31 PM  Result Value Ref Range   Troponin I <0.30 <0.30 ng/mL    Comment:        Due to the release kinetics of cTnI, a negative result within the first hours of the onset of symptoms does not rule out myocardial infarction with certainty. If myocardial infarction is still suspected, repeat the test at appropriate intervals.   I-Stat arterial blood gas, ED     Status: Abnormal   Collection Time: 10/03/14  1:43 PM    Result Value Ref Range   pH, Arterial 7.339 (L) 7.350 - 7.450   pCO2 arterial 43.1 35.0 - 45.0 mmHg   pO2, Arterial 252.0 (H) 80.0 - 100.0 mmHg   Bicarbonate 23.2 20.0 - 24.0 mEq/L   TCO2 24 0 - 100 mmol/L   O2 Saturation 100.0 %   Acid-base deficit 3.0 (H) 0.0 - 2.0 mmol/L   Patient temperature 98.6 F    Collection site RADIAL, ALLEN'S TEST ACCEPTABLE    Drawn by RT    Sample type ARTERIAL   MRSA PCR Screening     Status: None   Collection Time: 10/03/14  2:48 PM  Result Value Ref Range   MRSA by PCR NEGATIVE NEGATIVE    Comment:        The GeneXpert MRSA Assay (FDA approved for NASAL specimens only), is one component of a comprehensive MRSA colonization surveillance program. It is not intended to diagnose MRSA infection nor to guide or monitor treatment for MRSA infections.   Triglycerides     Status: None   Collection Time: 10/03/14  3:50 PM  Result Value Ref Range   Triglycerides 149 <150 mg/dL  Glucose, capillary     Status: Abnormal   Collection Time: 10/03/14  4:00 PM  Result Value Ref Range   Glucose-Capillary 190 (H) 70 - 99 mg/dL   Comment 1 Notify RN    Comment 2 Documented in Chart   Urinalysis, Routine w reflex microscopic     Status: Abnormal   Collection Time: 10/03/14  4:48 PM  Result Value Ref Range   Color, Urine YELLOW YELLOW   APPearance CLEAR CLEAR   Specific Gravity, Urine 1.030 1.005 - 1.030   pH 6.5 5.0 - 8.0   Glucose, UA 250 (A) NEGATIVE mg/dL   Hgb urine dipstick SMALL (A) NEGATIVE   Bilirubin Urine NEGATIVE NEGATIVE   Ketones, ur 15 (A) NEGATIVE mg/dL   Protein, ur 30 (A) NEGATIVE mg/dL   Urobilinogen, UA 0.2 0.0 - 1.0 mg/dL   Nitrite NEGATIVE NEGATIVE   Leukocytes, UA NEGATIVE NEGATIVE  Urine microscopic-add on     Status: Abnormal   Collection Time: 10/03/14  4:48 PM  Result Value Ref Range   WBC, UA 0-2 <3 WBC/hpf   RBC / HPF 0-2 <3 RBC/hpf   Casts HYALINE CASTS (A) NEGATIVE   Urine-Other MUCOUS PRESENT   CBC  Status:  Abnormal   Collection Time: 10/03/14  6:25 PM  Result Value Ref Range   WBC 10.5 4.0 - 10.5 K/uL   RBC 5.08 4.22 - 5.81 MIL/uL   Hemoglobin 15.9 13.0 - 17.0 g/dL   HCT 45.2 39.0 - 52.0 %   MCV 89.0 78.0 - 100.0 fL   MCH 31.3 26.0 - 34.0 pg   MCHC 35.2 30.0 - 36.0 g/dL   RDW 13.0 11.5 - 15.5 %   Platelets 137 (L) 150 - 400 K/uL    IMAGING: Ct Head Wo Contrast  10/03/2014   CLINICAL DATA:  Level 1 trauma.  Fell and hit head on tractor.  EXAM: CT HEAD WITHOUT CONTRAST  CT MAXILLOFACIAL WITHOUT CONTRAST  CT CERVICAL SPINE WITHOUT CONTRAST  TECHNIQUE: Multidetector CT imaging of the head, cervical spine, and maxillofacial structures were performed using the standard protocol without intravenous contrast. Multiplanar CT image reconstructions of the cervical spine and maxillofacial structures were also generated.  COMPARISON:  None.  FINDINGS: CT HEAD FINDINGS  There is no intra or extra-axial fluid collection or mass lesion. The basilar cisterns and ventricles have a normal appearance. There is no CT evidence for acute infarction or hemorrhage. There is central and cortical atrophy. Mild periventricular white matter changes are consistent with small vessel disease.  Bone windows show right frontal scalp hematoma without associated underlying calvarial fracture. There is mild mucoperiosteal thickening of the ethmoid and axillary sinuses.  CT MAXILLOFACIAL FINDINGS  The globes, orbits are intact. The zygomatic arches, mandible, and temporomandibular joints are intact. Nasal bones and bony nasal septum are intact. Pterygoid plates and skullbase are intact.  Patient has endotracheal tube. Nasogastric tube is coiled in the patient's mass. There are changes of chronic sinusitis involving the ethmoid and maxillary sinuses. No air-fluid levels are identified within the paranasal sinuses. No mastoid effusions. There is cerumen within the right external auditory canal.  CT CERVICAL SPINE FINDINGS  There is normal  alignment of the cervical spine. There is no evidence for acute fracture or dislocation. Prevertebral soft tissues have a normal appearance. Lung apices have a normal appearance. There is atherosclerotic calcification of the carotid arteries.  IMPRESSION: 1. Atrophy and small vessel disease. 2.  No evidence for acute intracranial abnormality. 3. Right frontal scalp hematoma. 4. Chronic sinusitis. 5. Orogastric tube coiled within the patient's mouth. 6. No evidence for acute fracture of the maxillofacial bones. 7. Degenerative changes without evidence for acute abnormality of the cervical spine.   Electronically Signed   By: Shon Hale M.D.   On: 10/03/2014 12:48   Ct Chest W Contrast  10/03/2014   CLINICAL DATA:  Level 1 trauma. The patient fell and hit head on tractor. Right frontal scalp hematoma.  EXAM: CT CHEST, ABDOMEN, AND PELVIS WITH CONTRAST  TECHNIQUE: Multidetector CT imaging of the chest, abdomen and pelvis was performed following the standard protocol during bolus administration of intravenous contrast.  CONTRAST:  80 cc Omnipaque 300  COMPARISON:  Chest x-ray and AP pelvis on 10/03/2014  FINDINGS: CT CHEST FINDINGS  Heart: The heart is mildly enlarged. No pericardial effusion. There is dense atherosclerotic calcification of the coronary arteries.  Vascular structures: The aorta is tortuous and calcified. It ascending aorta is ectatic, measuring up to 3 cm in diameter. No evidence for vascular injury.  Mediastinum/thyroid: No mediastinal, hilar, or axillary adenopathy. Endotracheal tube is in place with tip above the carina. Nasogastric tube is in place to the stomach.  Lungs/Airways: There is bibasilar  dependent change. No pleural effusions. No pneumothorax. Prior right shoulder tenderness repair.  Chest wall/osseous structures: No acute displaced fractures are identified. There is moderate degenerative change throughout the thoracic spine. Sternum is intact.  CT ABDOMEN AND PELVIS FINDINGS  Upper  abdomen: No focal abnormality identified within the liver, spleen, pancreas, or adrenal glands. The kidneys show symmetric excretion bilaterally without focal mass. Gallbladder is present.  Gastrointestinal tract: And the stomach contains nasogastric tube. Small bowel loops are normal in appearance. There are scattered colonic diverticula but no evidence for acute diverticulitis or colonic injury. The appendix is well seen and has a normal appearance.  Pelvis: Patient has a penile prosthesis. The urinary bladder has a normal appearance. Prostatic calcifications are present. No free pelvic fluid.  Retroperitoneum: No retroperitoneal or mesenteric adenopathy. There is dense atherosclerotic calcification of the abdominal aorta. No aneurysm or dissection.  Abdominal wall: No acute abnormality.  Osseous structures: There is an old wedge compression fracture of L2 with significant loss of height. Significant osteophytes are identified throughout the lower thoracic and lumbar spine. No suspicious lytic or blastic lesions are identified.  IMPRESSION: 1. Cardiomegaly and dense atherosclerotic calcification of the coronary arteries and aorta. 2. No evidence for vascular injury. 3. Dependent change within the lungs bilaterally. 4. No evidence for acute displaced fracture. 5. No evidence for injury of the solid organs or bowel. 6. Nasogastric tube to the stomach. 7. Endotracheal to to the lower trachea. 8. Old wedge compression fracture of L2.   Electronically Signed   By: Shon Hale M.D.   On: 10/03/2014 12:42   Ct Cervical Spine Wo Contrast  10/03/2014   CLINICAL DATA:  Level 1 trauma.  Fell and hit head on tractor.  EXAM: CT HEAD WITHOUT CONTRAST  CT MAXILLOFACIAL WITHOUT CONTRAST  CT CERVICAL SPINE WITHOUT CONTRAST  TECHNIQUE: Multidetector CT imaging of the head, cervical spine, and maxillofacial structures were performed using the standard protocol without intravenous contrast. Multiplanar CT image reconstructions of  the cervical spine and maxillofacial structures were also generated.  COMPARISON:  None.  FINDINGS: CT HEAD FINDINGS  There is no intra or extra-axial fluid collection or mass lesion. The basilar cisterns and ventricles have a normal appearance. There is no CT evidence for acute infarction or hemorrhage. There is central and cortical atrophy. Mild periventricular white matter changes are consistent with small vessel disease.  Bone windows show right frontal scalp hematoma without associated underlying calvarial fracture. There is mild mucoperiosteal thickening of the ethmoid and axillary sinuses.  CT MAXILLOFACIAL FINDINGS  The globes, orbits are intact. The zygomatic arches, mandible, and temporomandibular joints are intact. Nasal bones and bony nasal septum are intact. Pterygoid plates and skullbase are intact.  Patient has endotracheal tube. Nasogastric tube is coiled in the patient's mass. There are changes of chronic sinusitis involving the ethmoid and maxillary sinuses. No air-fluid levels are identified within the paranasal sinuses. No mastoid effusions. There is cerumen within the right external auditory canal.  CT CERVICAL SPINE FINDINGS  There is normal alignment of the cervical spine. There is no evidence for acute fracture or dislocation. Prevertebral soft tissues have a normal appearance. Lung apices have a normal appearance. There is atherosclerotic calcification of the carotid arteries.  IMPRESSION: 1. Atrophy and small vessel disease. 2.  No evidence for acute intracranial abnormality. 3. Right frontal scalp hematoma. 4. Chronic sinusitis. 5. Orogastric tube coiled within the patient's mouth. 6. No evidence for acute fracture of the maxillofacial bones. 7. Degenerative changes  without evidence for acute abnormality of the cervical spine.   Electronically Signed   By: Shon Hale M.D.   On: 10/03/2014 12:48   Ct Abdomen Pelvis W Contrast  10/03/2014   CLINICAL DATA:  Level 1 trauma. The patient  fell and hit head on tractor. Right frontal scalp hematoma.  EXAM: CT CHEST, ABDOMEN, AND PELVIS WITH CONTRAST  TECHNIQUE: Multidetector CT imaging of the chest, abdomen and pelvis was performed following the standard protocol during bolus administration of intravenous contrast.  CONTRAST:  80 cc Omnipaque 300  COMPARISON:  Chest x-ray and AP pelvis on 10/03/2014  FINDINGS: CT CHEST FINDINGS  Heart: The heart is mildly enlarged. No pericardial effusion. There is dense atherosclerotic calcification of the coronary arteries.  Vascular structures: The aorta is tortuous and calcified. It ascending aorta is ectatic, measuring up to 3 cm in diameter. No evidence for vascular injury.  Mediastinum/thyroid: No mediastinal, hilar, or axillary adenopathy. Endotracheal tube is in place with tip above the carina. Nasogastric tube is in place to the stomach.  Lungs/Airways: There is bibasilar dependent change. No pleural effusions. No pneumothorax. Prior right shoulder tenderness repair.  Chest wall/osseous structures: No acute displaced fractures are identified. There is moderate degenerative change throughout the thoracic spine. Sternum is intact.  CT ABDOMEN AND PELVIS FINDINGS  Upper abdomen: No focal abnormality identified within the liver, spleen, pancreas, or adrenal glands. The kidneys show symmetric excretion bilaterally without focal mass. Gallbladder is present.  Gastrointestinal tract: And the stomach contains nasogastric tube. Small bowel loops are normal in appearance. There are scattered colonic diverticula but no evidence for acute diverticulitis or colonic injury. The appendix is well seen and has a normal appearance.  Pelvis: Patient has a penile prosthesis. The urinary bladder has a normal appearance. Prostatic calcifications are present. No free pelvic fluid.  Retroperitoneum: No retroperitoneal or mesenteric adenopathy. There is dense atherosclerotic calcification of the abdominal aorta. No aneurysm or  dissection.  Abdominal wall: No acute abnormality.  Osseous structures: There is an old wedge compression fracture of L2 with significant loss of height. Significant osteophytes are identified throughout the lower thoracic and lumbar spine. No suspicious lytic or blastic lesions are identified.  IMPRESSION: 1. Cardiomegaly and dense atherosclerotic calcification of the coronary arteries and aorta. 2. No evidence for vascular injury. 3. Dependent change within the lungs bilaterally. 4. No evidence for acute displaced fracture. 5. No evidence for injury of the solid organs or bowel. 6. Nasogastric tube to the stomach. 7. Endotracheal to to the lower trachea. 8. Old wedge compression fracture of L2.   Electronically Signed   By: Shon Hale M.D.   On: 10/03/2014 12:42   Dg Pelvis Portable  10/03/2014   CLINICAL DATA:  Fall from tractor.  Trauma  EXAM: PORTABLE PELVIS 1-2 VIEWS  COMPARISON:  None.  FINDINGS: There is no evidence of pelvic fracture or diastasis. No pelvic bone lesions are seen. Penile prosthesis  IMPRESSION: Negative.   Electronically Signed   By: Franchot Gallo M.D.   On: 10/03/2014 11:49   Dg Chest Portable 1 View  10/03/2014   CLINICAL DATA:  80 year old male status post fall from tractor. Level 1 trauma. Initial encounter.  EXAM: PORTABLE CHEST - 1 VIEW  COMPARISON:  None.  FINDINGS: Portable AP supine view at 1123 hrs. Intubated. Endotracheal tube tip in good position just below the clavicles. Mildly rotated to the right. Normal cardiac size and mediastinal contours. No pneumothorax, pleural effusion, or pulmonary contusion identified on  this supine view. No acute osseous injury identified in the visible thorax.  IMPRESSION: 1. Intubated, ET tube in good position. 2.  No acute traumatic injury identified.   Electronically Signed   By: Lars Pinks M.D.   On: 10/03/2014 11:49   Ct Maxillofacial Wo Cm  10/03/2014   CLINICAL DATA:  Level 1 trauma.  Fell and hit head on tractor.  EXAM: CT HEAD  WITHOUT CONTRAST  CT MAXILLOFACIAL WITHOUT CONTRAST  CT CERVICAL SPINE WITHOUT CONTRAST  TECHNIQUE: Multidetector CT imaging of the head, cervical spine, and maxillofacial structures were performed using the standard protocol without intravenous contrast. Multiplanar CT image reconstructions of the cervical spine and maxillofacial structures were also generated.  COMPARISON:  None.  FINDINGS: CT HEAD FINDINGS  There is no intra or extra-axial fluid collection or mass lesion. The basilar cisterns and ventricles have a normal appearance. There is no CT evidence for acute infarction or hemorrhage. There is central and cortical atrophy. Mild periventricular white matter changes are consistent with small vessel disease.  Bone windows show right frontal scalp hematoma without associated underlying calvarial fracture. There is mild mucoperiosteal thickening of the ethmoid and axillary sinuses.  CT MAXILLOFACIAL FINDINGS  The globes, orbits are intact. The zygomatic arches, mandible, and temporomandibular joints are intact. Nasal bones and bony nasal septum are intact. Pterygoid plates and skullbase are intact.  Patient has endotracheal tube. Nasogastric tube is coiled in the patient's mass. There are changes of chronic sinusitis involving the ethmoid and maxillary sinuses. No air-fluid levels are identified within the paranasal sinuses. No mastoid effusions. There is cerumen within the right external auditory canal.  CT CERVICAL SPINE FINDINGS  There is normal alignment of the cervical spine. There is no evidence for acute fracture or dislocation. Prevertebral soft tissues have a normal appearance. Lung apices have a normal appearance. There is atherosclerotic calcification of the carotid arteries.  IMPRESSION: 1. Atrophy and small vessel disease. 2.  No evidence for acute intracranial abnormality. 3. Right frontal scalp hematoma. 4. Chronic sinusitis. 5. Orogastric tube coiled within the patient's mouth. 6. No evidence  for acute fracture of the maxillofacial bones. 7. Degenerative changes without evidence for acute abnormality of the cervical spine.   Electronically Signed   By: Shon Hale M.D.   On: 10/03/2014 12:48    HOSPITAL DIAGNOSES: Principal Problem:   TBI (traumatic brain injury) Active Problems:   Syncope   Junctional bradycardia   IMPRESSION: 1. Syncope, possibly related to bradycardia  RECOMMENDATION: 1. Continue telemetry monitoring. Have Zoll pads and external pacer nearby, in case he develops hemodynamically unstable bradycardia. Avoid AVN blocking agents. Avoid propofol for sedation, try to minimize. EF in 03/2013 was 45%, would recommend repeat echo to reassess LV function. His last stent was placed in 2013, therefore, it will be ok to hold aspirin and Brillinta. May ultimately need a pacemaker if there is a convincing link between bradycardia and the syncopal episodes. I do not see that he was on any AVN blocking medications prior to this event.  We will follow with you.  Time Spent Directly with Patient: 30 minutes  Pixie Casino, MD, [facility] Attending Cardiologist [facility]  Ikaika Showers C 10/03/2014, 7:54 PM

## 2014-10-03 NOTE — Progress Notes (Signed)
eLink Physician-Brief Progress Note Patient Name: Frank Simpson DOB: [birth year] MRN: [id number]   Date of Service  10/03/2014  HPI/Events of Note  Recurrent brady episodes  with hypotension Original cause of syncope not clear  eICU Interventions  Cards consulted Avoid propofol/precedex Use fent gtt, haldol if needed     Intervention Category Major Interventions: Arrhythmia - evaluation and management  ALVA,RAKESH V. 10/03/2014, 7:02 PM

## 2014-10-03 NOTE — ED Notes (Signed)
Dr Grandville Silos speaking with family at bedside.  VS stable.

## 2014-10-03 NOTE — ED Notes (Addendum)
Called pharmacy RE fentanyl drip.  Pt increasingly fighting vent.

## 2014-10-03 NOTE — H&P (Signed)
Frank Simpson is an 80 y.o. male.   Chief Complaint: Fall HPI: Frank Simpson is a patient with multiple medical problems who has had a recent history of frequent falls. He was feeling dizzy and fell while working in the yard with his wife. She sent him to go back to the house and he fell again on the way, striking his head on a tractor. EMS was called and he was unresponsive for them. He came in as a level 1 trauma. He was somewhat responsive here but still had a low GCS and was intubated by the EDP.  Past Medical History  Diagnosis Date  . Diabetes mellitus without complication     Surg Hx PCA Penile implant  No family history on file. Social History:  has no tobacco, alcohol, and drug history on file.  Allergies: Allergies not on file   Results for orders placed or performed during the hospital encounter of 10/03/14 (from the past 48 hour(s))  Type and screen     Status: None (Preliminary result)   Collection Time: 10/03/14 11:10 AM  Result Value Ref Range   ABO/RH(D) B POS    Antibody Screen PENDING    Sample Expiration 10/06/2014    Unit Number [id number]    Blood Component Type RED CELLS,LR    Unit division 00    Status of Unit ISSUED    Unit tag comment VERBAL ORDERS PER DR ZAVITZ    Transfusion Status OK TO TRANSFUSE    Crossmatch Result PENDING    Unit Number [id number]    Blood Component Type RED CELLS,LR    Unit division 00    Status of Unit ISSUED    Unit tag comment VERBAL ORDERS PER DR ZAVITZ    Transfusion Status OK TO TRANSFUSE    Crossmatch Result PENDING   Prepare fresh frozen plasma     Status: None (Preliminary result)   Collection Time: 10/03/14 11:10 AM  Result Value Ref Range   Unit Number [id number]    Blood Component Type THW PLS APHR    Unit division A0    Status of Unit ISSUED    Unit tag comment VERBAL ORDERS PER DR ZAVITZ    Transfusion Status OK TO TRANSFUSE    Unit Number [id number]    Blood Component Type LIQ PLASMA    Unit  division 00    Status of Unit ISSUED    Unit tag comment VERBAL ORDERS PER DR ZAVITZ    Transfusion Status OK TO TRANSFUSE   I-stat chem 8, ed     Status: Abnormal   Collection Time: 10/03/14 11:26 AM  Result Value Ref Range   Sodium 140 137 - 147 mEq/L   Potassium 3.3 (L) 3.7 - 5.3 mEq/L   Chloride 106 96 - 112 mEq/L   BUN 22 6 - 23 mg/dL   Creatinine, Ser 1.60 (H) 0.50 - 1.35 mg/dL   Glucose, Bld 168 (H) 70 - 99 mg/dL   Calcium, Ion 1.10 (L) 1.13 - 1.30 mmol/L   TCO2 22 0 - 100 mmol/L   Hemoglobin 17.0 13.0 - 17.0 g/dL   HCT 50.0 39.0 - 52.0 %  CBC     Status: Abnormal   Collection Time: 10/03/14 11:33 AM  Result Value Ref Range   WBC 8.2 4.0 - 10.5 K/uL   RBC 5.15 4.22 - 5.81 MIL/uL   Hemoglobin 16.1 13.0 - 17.0 g/dL   HCT 45.4 39.0 - 52.0 %   MCV 88.2 78.0 - 100.0 fL  MCH 31.3 26.0 - 34.0 pg   MCHC 35.5 30.0 - 36.0 g/dL   RDW 12.7 11.5 - 15.5 %   Platelets 127 (L) 150 - 400 K/uL  I-Stat CG4 Lactic Acid, ED     Status: None   Collection Time: 10/03/14 11:46 AM  Result Value Ref Range   Lactic Acid, Venous 2.05 0.5 - 2.2 mmol/L   Dg Pelvis Portable  10/03/2014   CLINICAL DATA:  Fall from tractor.  Trauma  EXAM: PORTABLE PELVIS 1-2 VIEWS  COMPARISON:  None.  FINDINGS: There is no evidence of pelvic fracture or diastasis. No pelvic bone lesions are seen. Penile prosthesis  IMPRESSION: Negative.   Electronically Signed   By: Franchot Gallo M.D.   On: 10/03/2014 11:49   Dg Chest Portable 1 View  10/03/2014   CLINICAL DATA:  80 year old male status post fall from tractor. Level 1 trauma. Initial encounter.  EXAM: PORTABLE CHEST - 1 VIEW  COMPARISON:  None.  FINDINGS: Portable AP supine view at 1123 hrs. Intubated. Endotracheal tube tip in good position just below the clavicles. Mildly rotated to the right. Normal cardiac size and mediastinal contours. No pneumothorax, pleural effusion, or pulmonary contusion identified on this supine view. No acute osseous injury identified in  the visible thorax.  IMPRESSION: 1. Intubated, ET tube in good position. 2.  No acute traumatic injury identified.   Electronically Signed   By: Lars Pinks M.D.   On: 10/03/2014 11:49    Review of Systems  Unable to perform ROS: intubated    Blood pressure 168/90, pulse 111, resp. rate 12, height 6' 0.5" (1.842 m), weight 205 lb (92.987 kg), SpO2 100 %. Physical Exam  Vitals reviewed. Constitutional: He appears well-developed and well-nourished. He appears lethargic. He is cooperative. No distress. Cervical collar and nasal cannula in place.  HENT:  Head: Normocephalic. Head is without raccoon's eyes, without Battle's sign, without abrasion, without contusion and without laceration.  Right Ear: Hearing, tympanic membrane, external ear and ear canal normal. No lacerations. No drainage or tenderness. No foreign bodies. Tympanic membrane is not perforated. No hemotympanum.  Left Ear: Hearing, tympanic membrane, external ear and ear canal normal. No lacerations. No drainage or tenderness. No foreign bodies. Tympanic membrane is not perforated. No hemotympanum.  Nose: Nose normal. No nose lacerations, sinus tenderness, nasal deformity or nasal septal hematoma. No epistaxis.  Mouth/Throat: Uvula is midline, oropharynx is clear and moist and mucous membranes are normal. No lacerations. No oropharyngeal exudate.  Eyes: Conjunctivae and lids are normal. Pupils are equal, round, and reactive to light. Right eye exhibits no discharge. Left eye exhibits no discharge. No scleral icterus.  Neck: Trachea normal. No JVD present. No spinous process tenderness and no muscular tenderness present. Carotid bruit is not present. No tracheal deviation present. No thyromegaly present.  Cardiovascular: Normal rate, regular rhythm, normal heart sounds, intact distal pulses and normal pulses.  Exam reveals no gallop and no friction rub.   No murmur heard. Respiratory: Effort normal and breath sounds normal. No stridor. No  respiratory distress. He has no wheezes. He has no rales. He exhibits no bony tenderness, no laceration and no crepitus.  GI: Soft. Normal appearance and bowel sounds are normal. He exhibits no distension. There is no rigidity and no CVA tenderness.  Genitourinary: Penis normal.  Musculoskeletal: He exhibits no edema.  Lymphadenopathy:    He has no cervical adenopathy.  Neurological: He has normal strength. He appears lethargic. No cranial nerve deficit or sensory  deficit. GCS eye subscore is 4. GCS verbal subscore is 1. GCS motor subscore is 6.  Skin: Skin is warm, dry and intact. He is not diaphoretic.  Psychiatric: He has a normal mood and affect. His speech is normal and behavior is normal.     Assessment/Plan Fall TBI  ARF Multiple medical problems  Admit to ICU, await resolution of post-concussive sx    Lisette Abu, PA-C Pager: [phone] General Trauma PA Pager: [phone] 10/03/2014, 12:04 PM

## 2014-10-03 NOTE — ED Notes (Signed)
Dr Nicole Kindred paged

## 2014-10-03 NOTE — ED Notes (Signed)
Called pharmacy again re fentanyl.  Pt beginning to try to pull out et tube.

## 2014-10-03 NOTE — Progress Notes (Signed)
10/03/14 1710  Vitals  BP (!) 55/36 mmHg  MAP (mmHg) 39  Pulse Rate (!) 53   Dr. Grandville Silos notified of heart rate and BP.  New orders received.

## 2014-10-03 NOTE — Progress Notes (Signed)
Patient ID: Frank Simpson, male   DOB: 1930/12/18, 80 y.o.   MRN: [id number] D/C Lovenox. Place foley. I spoke with his wife at the bedside. Georganna Skeans, MD, MPH, FACS Trauma: [phone] General Surgery: [phone]

## 2014-10-03 NOTE — ED Notes (Signed)
Pt. Transported from ED Trauma-A to 74M without incident, RT to monitor.

## 2014-10-03 NOTE — ED Notes (Signed)
Pt beginning to move.  BP elevating.  Propofol inreased.  Bolus given per Dr Reather Converse.

## 2014-10-03 NOTE — Progress Notes (Signed)
Chaplain responded to level I trauma in trauma room A.  Pt was awake but not responsive upon arrival.  EMT indicated that spouse might be on the way to ED.  Pt's record revealed spouse's name.  Chaplain greeted spouse, pt's sister and niece in waiting area and escorted to consult room.  MD briefed family on pt's condition while pt was in CT.  Pt's niece had to leave but asked chaplain to be sure mother (pt's sister) was eating.  Chaplain provided nourishment to family while waiting to see pt.  Chaplain also provided spiritual and emotional support as well as the ministry of presence and hospitality to family.  Chaplain will follow up as needed.   10/03/14 1130  Clinical Encounter Type  Visited With Patient;Family;Health care provider  Visit Type Initial;ED;Trauma  Referral From Care management  Spiritual Encounters  Spiritual Needs Prayer;Emotional  Stress Factors  Patient Stress Factors Exhausted;Health changes;Major life changes  Family Stress Factors Family relationships;Health changes;Loss of control;Major life changes   Mertie Moores, Chaplain

## 2014-10-03 NOTE — Progress Notes (Signed)
Orthopedic Tech Progress Note Patient Details:  Frank Simpson 09-21-1931 [id number]  Patient ID: Frank Simpson, male   DOB: 10-Jul-1931, 80 y.o.   MRN: [id number] Made level 1 trauma visit  Hildred Priest 10/03/2014, 11:36 AM

## 2014-10-04 LAB — BASIC METABOLIC PANEL
Anion gap: 12 (ref 5–15)
BUN: 21 mg/dL (ref 6–23)
CO2: 22 mEq/L (ref 19–32)
CREATININE: 1.56 mg/dL — AB (ref 0.50–1.35)
Calcium: 8.4 mg/dL (ref 8.4–10.5)
Chloride: 107 mEq/L (ref 96–112)
GFR calc non Af Amer: 40 mL/min — ABNORMAL LOW (ref >90)
GFR, EST AFRICAN AMERICAN: 46 mL/min — AB (ref >90)
Glucose, Bld: 122 mg/dL — ABNORMAL HIGH (ref 70–99)
Potassium: 4.1 mEq/L (ref 3.7–5.3)
Sodium: 141 mEq/L (ref 137–147)

## 2014-10-04 LAB — CBC
HCT: 41.4 % (ref 39.0–52.0)
Hemoglobin: 14.6 g/dL (ref 13.0–17.0)
MCH: 31.6 pg (ref 26.0–34.0)
MCHC: 35.3 g/dL (ref 30.0–36.0)
MCV: 89.6 fL (ref 78.0–100.0)
Platelets: 144 10*3/uL — ABNORMAL LOW (ref 150–400)
RBC: 4.62 MIL/uL (ref 4.22–5.81)
RDW: 13.2 % (ref 11.5–15.5)
WBC: 9.7 10*3/uL (ref 4.0–10.5)

## 2014-10-04 LAB — GLUCOSE, CAPILLARY
GLUCOSE-CAPILLARY: 248 mg/dL — AB (ref 70–99)
Glucose-Capillary: 115 mg/dL — ABNORMAL HIGH (ref 70–99)
Glucose-Capillary: 115 mg/dL — ABNORMAL HIGH (ref 70–99)
Glucose-Capillary: 138 mg/dL — ABNORMAL HIGH (ref 70–99)
Glucose-Capillary: 150 mg/dL — ABNORMAL HIGH (ref 70–99)

## 2014-10-04 MED ORDER — BACITRACIN-NEOMYCIN-POLYMYXIN OINTMENT TUBE
TOPICAL_OINTMENT | Freq: Two times a day (BID) | CUTANEOUS | Status: DC
  Administered 2014-10-04: 1 via TOPICAL
  Administered 2014-10-04 – 2014-10-05 (×2): via TOPICAL
  Administered 2014-10-05 – 2014-10-06 (×2): 1 via TOPICAL
  Administered 2014-10-06 – 2014-10-07 (×2): via TOPICAL
  Filled 2014-10-04: qty 15

## 2014-10-04 MED ORDER — HALOPERIDOL LACTATE 5 MG/ML IJ SOLN
4.0000 mg | Freq: Four times a day (QID) | INTRAMUSCULAR | Status: DC | PRN
  Administered 2014-10-07: 4 mg via INTRAVENOUS
  Filled 2014-10-04: qty 1

## 2014-10-04 MED ORDER — HYDROCODONE-ACETAMINOPHEN 5-325 MG PO TABS: 1.0000 | ORAL_TABLET | ORAL | Status: DC | PRN

## 2014-10-04 MED ORDER — MAGIC MOUTHWASH W/LIDOCAINE
10.0000 mL | Freq: Three times a day (TID) | ORAL | Status: DC
  Administered 2014-10-04 – 2014-10-07 (×7): 10 mL via ORAL
  Filled 2014-10-04 (×12): qty 10

## 2014-10-04 MED ORDER — LORAZEPAM 2 MG/ML IJ SOLN
INTRAMUSCULAR | Status: AC
  Filled 2014-10-04: qty 1

## 2014-10-04 MED ORDER — LORAZEPAM 2 MG/ML IJ SOLN
1.0000 mg | INTRAMUSCULAR | Status: DC | PRN
  Administered 2014-10-04 – 2014-10-07 (×2): 1 mg via INTRAVENOUS
  Filled 2014-10-04: qty 1

## 2014-10-04 MED ORDER — MORPHINE SULFATE 2 MG/ML IJ SOLN: 2.0000 mg | INTRAMUSCULAR | Status: DC | PRN

## 2014-10-04 NOTE — Progress Notes (Signed)
Patient ID: Frank Simpson, male   DOB: 1931/11/27, 80 y.o.   MRN: [id number] Denies neck pain. No posterior midline cervical tenderness. No pain with AROM. Collar D/Cd. Georganna Skeans, MD, MPH, FACS Trauma: [phone] General Surgery: [phone]

## 2014-10-04 NOTE — Progress Notes (Signed)
Subjective:  Awake and eating.  Sore, but no c/o SOB or chest pain  Objective:  Vital Signs in the last 24 hours: BP 165/71 mmHg  Pulse 73  Temp(Src) 98.7 F (37.1 C) (Oral)  Resp 27  Ht 5' 10" (1.778 m)  Wt 93.9 kg (207 lb 0.2 oz)  BMI 29.70 kg/m2  SpO2 97%  Physical Exam: Elderly WM with husky voice Head: Hematoma and trauma noted to scalp Lungs:  Clear  Cardiac:  Regular rhythm, normal S1 and S2, no S3 Extremities:  No edema present  Intake/Output from previous day: 11/06 0701 - 11/07 0700 In: 2027.7 [I.V.:2027.7] Out: 775 [Urine:660; Emesis/NG output:115] Weight Filed Weights   10/03/14 1134 10/03/14 1400  Weight: 92.987 kg (205 lb) 93.9 kg (207 lb 0.2 oz)    Lab Results: Basic Metabolic Panel:  Recent Labs  10/03/14 1133 10/04/14 0249  NA 140 141  K 3.5* 4.1  CL 103 107  CO2 22 22  GLUCOSE 166* 122*  BUN 22 21  CREATININE 1.57* 1.56*    CBC:  Recent Labs  10/03/14 1825 10/04/14 0249  WBC 10.5 9.7  HGB 15.9 14.6  HCT 45.2 41.4  MCV 89.0 89.6  PLT 137* 144*    BNP No results found for: PROBNP  PROTIME: Lab Results  Component Value Date   INR 1.06 10/03/2014    Telemetry: Sinus rhythm and bradycardia, no further junctional rhythm  Assessment/Plan:  1. Syncopal episode ? Cause 2. Junctional bradycardia resolved in setting of propofol 3. Sinus bradycardia 4. CAD stable  Rec:  No severe bradycardia noted since yesterday.  Continue to observe on telemetry.     Kerry Hough  MD [facility] Cardiology  10/04/2014, 12:24 PM

## 2014-10-04 NOTE — Procedures (Signed)
Extubation Procedure Note  Patient Details:   Name: Frank Simpson DOB: [birth year] MRN: [id number]   Airway Documentation:     Evaluation  O2 sats: stable throughout Complications: No apparent complications Patient did tolerate procedure well. Bilateral Breath Sounds: Clear, Diminished Suctioning: Oral Yes. Extubated per Dr. Kayleen Memos.  Vital signs stable throughout  Velvet Bathe V 10/04/2014, 9:38 AM

## 2014-10-04 NOTE — Progress Notes (Signed)
Echo Lab  2D Echocardiogram completed.  Vanice Rappa L Meg Niemeier, RDCS 10/04/2014 9:12 AM

## 2014-10-04 NOTE — Progress Notes (Signed)
Patient ID: Frank Simpson, male   DOB: January 17, 1931, 80 y.o.   MRN: [id number] Follow up - Trauma Critical Care  Patient Details:    Frank Simpson is an 80 y.o. male.  Lines/tubes : Airway 8 mm (Active)  Secured at (cm) 26 cm 10/04/2014  7:22 AM  Measured From Lips 10/04/2014  7:22 AM  Secured Location Right 10/04/2014  7:22 AM  Secured By [employer] 10/04/2014  7:22 AM  Tube Holder Repositioned Yes 10/04/2014  7:22 AM  Cuff Pressure (cm H2O) 24 cm H2O 10/04/2014 12:19 AM  Site Condition Dry 10/04/2014  7:22 AM     NG/OG Tube Orogastric (Active)  Placement Verification Auscultation 10/03/2014  8:00 PM  Site Assessment Clean;Dry;Intact 10/03/2014  8:00 PM  Status Suction-low intermittent 10/04/2014  6:00 AM  Drainage Appearance Dark red 10/04/2014  6:00 AM  Output (mL) 115 mL 10/04/2014  6:00 AM     Urethral Catheter Straight-tip 16 Fr. (Active)  Indication for Insertion or Continuance of Catheter Acute urinary retention 10/04/2014  7:41 AM  Catheter Maintenance Bag below level of bladder;Catheter secured;Drainage bag/tubing not touching floor;Insertion date on drainage bag;No dependent loops;Seal intact;Bag emptied prior to transport 10/04/2014  7:41 AM  Output (mL) 150 mL 10/04/2014  8:00 AM    Microbiology/Sepsis markers: Results for orders placed or performed during the hospital encounter of 10/03/14  MRSA PCR Screening     Status: None   Collection Time: 10/03/14  2:48 PM  Result Value Ref Range Status   MRSA by PCR NEGATIVE NEGATIVE Final    Comment:        The GeneXpert MRSA Assay (FDA approved for NASAL specimens only), is one component of a comprehensive MRSA colonization surveillance program. It is not intended to diagnose MRSA infection nor to guide or monitor treatment for MRSA infections.     Anti-infectives:  Anti-infectives    None      Best Practice/Protocols:  VTE Prophylaxis: Mechanical sedation held  Consults: Treatment Team:  Rounding  Lbcardiology, MD    Studies:    Events:  Subjective:    Overnight Issues:   Objective:  Vital signs for last 24 hours: Temp:  [97.2 F (36.2 C)-98.7 F (37.1 C)] 98.7 F (37.1 C) (11/07 0800) Pulse Rate:  [49-111] 68 (11/07 0900) Resp:  [7-30] 17 (11/07 0900) BP: (55-186)/(32-105) 161/69 mmHg (11/07 0900) SpO2:  [95 %-100 %] 100 % (11/07 0900) FiO2 (%):  [40 %-100 %] 40 % (11/07 0800) Weight:  [205 lb (92.987 kg)-207 lb 0.2 oz (93.9 kg)] 207 lb 0.2 oz (93.9 kg) (11/06 1400)  Hemodynamic parameters for last 24 hours:    Intake/Output from previous day: 11/06 0701 - 11/07 0700 In: 2027.7 [I.V.:2027.7] Out: 775 [Urine:660; Emesis/NG output:115]  Intake/Output this shift: Total I/O In: 200 [I.V.:200] Out: 150 [Urine:150]  Vent settings for last 24 hours: Vent Mode:  [-] PSV;CPAP FiO2 (%):  [40 %-100 %] 40 % Set Rate:  [14 bmp] 14 bmp Vt Set:  [630 mL] 630 mL PEEP:  [5 cmH20] 5 cmH20 Pressure Support:  [8 cmH20] 8 cmH20 Plateau Pressure:  [13 cmH20-18 cmH20] 16 cmH20  Physical Exam:  General: awake on vent Neuro: PERRL, MAE, F/C HEENT/Neck: ETT and collar Resp: clear to auscultation bilaterally CVS: RRR GI: soft, NT Extremities: no edema  Results for orders placed or performed during the hospital encounter of 10/03/14 (from the past 24 hour(s))  Type and screen     Status: None   Collection Time: 10/03/14  11:10 AM  Result Value Ref Range   ABO/RH(D) B POS    Antibody Screen NEG    Sample Expiration 10/06/2014    Unit Number [id number]    Blood Component Type RED CELLS,LR    Unit division 00    Status of Unit REL FROM [facility]    Unit tag comment VERBAL ORDERS PER DR ZAVITZ    Transfusion Status OK TO TRANSFUSE    Crossmatch Result NOT NEEDED    Unit Number [id number]    Blood Component Type RED CELLS,LR    Unit division 00    Status of Unit REL FROM [facility]    Unit tag comment VERBAL ORDERS PER DR ZAVITZ    Transfusion Status OK TO TRANSFUSE     Crossmatch Result NOT NEEDED   Prepare fresh frozen plasma     Status: None   Collection Time: 10/03/14 11:10 AM  Result Value Ref Range   Unit Number [id number]    Blood Component Type THW PLS APHR    Unit division A0    Status of Unit REL FROM [facility]    Unit tag comment VERBAL ORDERS PER DR ZAVITZ    Transfusion Status OK TO TRANSFUSE    Unit Number [id number]    Blood Component Type LIQ PLASMA    Unit division 00    Status of Unit REL FROM [facility]    Unit tag comment VERBAL ORDERS PER DR ZAVITZ    Transfusion Status OK TO TRANSFUSE   ABO/Rh     Status: None   Collection Time: 10/03/14 11:10 AM  Result Value Ref Range   ABO/RH(D) B POS   I-stat chem 8, ed     Status: Abnormal   Collection Time: 10/03/14 11:26 AM  Result Value Ref Range   Sodium 140 137 - 147 mEq/L   Potassium 3.3 (L) 3.7 - 5.3 mEq/L   Chloride 106 96 - 112 mEq/L   BUN 22 6 - 23 mg/dL   Creatinine, Ser 1.60 (H) 0.50 - 1.35 mg/dL   Glucose, Bld 168 (H) 70 - 99 mg/dL   Calcium, Ion 1.10 (L) 1.13 - 1.30 mmol/L   TCO2 22 0 - 100 mmol/L   Hemoglobin 17.0 13.0 - 17.0 g/dL   HCT 50.0 39.0 - 52.0 %  CDS serology     Status: None   Collection Time: 10/03/14 11:33 AM  Result Value Ref Range   CDS serology specimen      SPECIMEN WILL BE HELD FOR 14 DAYS IF TESTING IS REQUIRED  Comprehensive metabolic panel     Status: Abnormal   Collection Time: 10/03/14 11:33 AM  Result Value Ref Range   Sodium 140 137 - 147 mEq/L   Potassium 3.5 (L) 3.7 - 5.3 mEq/L   Chloride 103 96 - 112 mEq/L   CO2 22 19 - 32 mEq/L   Glucose, Bld 166 (H) 70 - 99 mg/dL   BUN 22 6 - 23 mg/dL   Creatinine, Ser 1.57 (H) 0.50 - 1.35 mg/dL   Calcium 8.8 8.4 - 10.5 mg/dL   Total Protein 6.8 6.0 - 8.3 g/dL   Albumin 3.4 (L) 3.5 - 5.2 g/dL   AST 19 0 - 37 U/L   ALT 11 0 - 53 U/L   Alkaline Phosphatase 51 39 - 117 U/L   Total Bilirubin 0.6 0.3 - 1.2 mg/dL   GFR calc non Af Amer 40 (L) >90 mL/min   GFR calc Af Amer 46 (L) >90 mL/min  Anion  gap 15 5 - 15  CBC     Status: Abnormal   Collection Time: 10/03/14 11:33 AM  Result Value Ref Range   WBC 8.2 4.0 - 10.5 K/uL   RBC 5.15 4.22 - 5.81 MIL/uL   Hemoglobin 16.1 13.0 - 17.0 g/dL   HCT 45.4 39.0 - 52.0 %   MCV 88.2 78.0 - 100.0 fL   MCH 31.3 26.0 - 34.0 pg   MCHC 35.5 30.0 - 36.0 g/dL   RDW 12.7 11.5 - 15.5 %   Platelets 127 (L) 150 - 400 K/uL  Ethanol     Status: None   Collection Time: 10/03/14 11:33 AM  Result Value Ref Range   Alcohol, Ethyl (B) <11 0 - 11 mg/dL  Protime-INR     Status: None   Collection Time: 10/03/14 11:33 AM  Result Value Ref Range   Prothrombin Time 13.9 11.6 - 15.2 seconds   INR 1.06 0.00 - 1.49  I-Stat CG4 Lactic Acid, ED     Status: None   Collection Time: 10/03/14 11:46 AM  Result Value Ref Range   Lactic Acid, Venous 2.05 0.5 - 2.2 mmol/L  CBG monitoring, ED     Status: Abnormal   Collection Time: 10/03/14 12:30 PM  Result Value Ref Range   Glucose-Capillary 155 (H) 70 - 99 mg/dL  Troponin I     Status: None   Collection Time: 10/03/14  1:31 PM  Result Value Ref Range   Troponin I <0.30 <0.30 ng/mL  I-Stat arterial blood gas, ED     Status: Abnormal   Collection Time: 10/03/14  1:43 PM  Result Value Ref Range   pH, Arterial 7.339 (L) 7.350 - 7.450   pCO2 arterial 43.1 35.0 - 45.0 mmHg   pO2, Arterial 252.0 (H) 80.0 - 100.0 mmHg   Bicarbonate 23.2 20.0 - 24.0 mEq/L   TCO2 24 0 - 100 mmol/L   O2 Saturation 100.0 %   Acid-base deficit 3.0 (H) 0.0 - 2.0 mmol/L   Patient temperature 98.6 F    Collection site RADIAL, ALLEN'S TEST ACCEPTABLE    Drawn by RT    Sample type ARTERIAL   MRSA PCR Screening     Status: None   Collection Time: 10/03/14  2:48 PM  Result Value Ref Range   MRSA by PCR NEGATIVE NEGATIVE  Triglycerides     Status: None   Collection Time: 10/03/14  3:50 PM  Result Value Ref Range   Triglycerides 149 <150 mg/dL  Glucose, capillary     Status: Abnormal   Collection Time: 10/03/14  4:00 PM  Result Value Ref  Range   Glucose-Capillary 190 (H) 70 - 99 mg/dL   Comment 1 Notify RN    Comment 2 Documented in Chart   Urinalysis, Routine w reflex microscopic     Status: Abnormal   Collection Time: 10/03/14  4:48 PM  Result Value Ref Range   Color, Urine YELLOW YELLOW   APPearance CLEAR CLEAR   Specific Gravity, Urine 1.030 1.005 - 1.030   pH 6.5 5.0 - 8.0   Glucose, UA 250 (A) NEGATIVE mg/dL   Hgb urine dipstick SMALL (A) NEGATIVE   Bilirubin Urine NEGATIVE NEGATIVE   Ketones, ur 15 (A) NEGATIVE mg/dL   Protein, ur 30 (A) NEGATIVE mg/dL   Urobilinogen, UA 0.2 0.0 - 1.0 mg/dL   Nitrite NEGATIVE NEGATIVE   Leukocytes, UA NEGATIVE NEGATIVE  Urine microscopic-add on     Status: Abnormal   Collection  Time: 10/03/14  4:48 PM  Result Value Ref Range   WBC, UA 0-2 <3 WBC/hpf   RBC / HPF 0-2 <3 RBC/hpf   Casts HYALINE CASTS (A) NEGATIVE   Urine-Other MUCOUS PRESENT   CBC     Status: Abnormal   Collection Time: 10/03/14  6:25 PM  Result Value Ref Range   WBC 10.5 4.0 - 10.5 K/uL   RBC 5.08 4.22 - 5.81 MIL/uL   Hemoglobin 15.9 13.0 - 17.0 g/dL   HCT 45.2 39.0 - 52.0 %   MCV 89.0 78.0 - 100.0 fL   MCH 31.3 26.0 - 34.0 pg   MCHC 35.2 30.0 - 36.0 g/dL   RDW 13.0 11.5 - 15.5 %   Platelets 137 (L) 150 - 400 K/uL  Glucose, capillary     Status: Abnormal   Collection Time: 10/03/14  7:41 PM  Result Value Ref Range   Glucose-Capillary 138 (H) 70 - 99 mg/dL   Comment 1 Documented in Chart    Comment 2 Notify RN   Glucose, capillary     Status: Abnormal   Collection Time: 10/03/14 11:52 PM  Result Value Ref Range   Glucose-Capillary 138 (H) 70 - 99 mg/dL  CBC     Status: Abnormal   Collection Time: 10/04/14  2:49 AM  Result Value Ref Range   WBC 9.7 4.0 - 10.5 K/uL   RBC 4.62 4.22 - 5.81 MIL/uL   Hemoglobin 14.6 13.0 - 17.0 g/dL   HCT 41.4 39.0 - 52.0 %   MCV 89.6 78.0 - 100.0 fL   MCH 31.6 26.0 - 34.0 pg   MCHC 35.3 30.0 - 36.0 g/dL   RDW 13.2 11.5 - 15.5 %   Platelets 144 (L) 150 - 400  K/uL  Basic metabolic panel     Status: Abnormal   Collection Time: 10/04/14  2:49 AM  Result Value Ref Range   Sodium 141 137 - 147 mEq/L   Potassium 4.1 3.7 - 5.3 mEq/L   Chloride 107 96 - 112 mEq/L   CO2 22 19 - 32 mEq/L   Glucose, Bld 122 (H) 70 - 99 mg/dL   BUN 21 6 - 23 mg/dL   Creatinine, Ser 1.56 (H) 0.50 - 1.35 mg/dL   Calcium 8.4 8.4 - 10.5 mg/dL   GFR calc non Af Amer 40 (L) >90 mL/min   GFR calc Af Amer 46 (L) >90 mL/min   Anion gap 12 5 - 15  Glucose, capillary     Status: Abnormal   Collection Time: 10/04/14  4:31 AM  Result Value Ref Range   Glucose-Capillary 115 (H) 70 - 99 mg/dL    Assessment & Plan: Present on Admission:  . TBI (traumatic brain injury) . Syncope . Junctional bradycardia   LOS: 1 day   Additional comments:I reviewed the patient's new clinical lab test results. . Fall Scalp abrasion and hematoma Concussion - exam much improved Vent dependent resp failure - extubate this AM FEN - continue IVF, CRT stable with admit, clears after extubation CV - resume brilinta tomorrow if Hb stable Dispo - ICU Critical Care Total Time*: 30 Minutes  Georganna Skeans, MD, MPH, FACS Trauma: [phone] General Surgery: [phone]  10/04/2014  *Care during the described time interval was provided by me. I have reviewed this patient's available data, including medical history, events of note, physical examination and test results as part of my evaluation.

## 2014-10-04 NOTE — Progress Notes (Signed)
Pt restless, agitated, trying to remove ETT. Pt had removed ETT place holder from face with mittens on, but ETT was still in place. Called RT to double check/verify placement of ETT and called Trauma MD on call to get an order for bilateral soft wrist restraints.   Dr. Ninfa Linden returned phone call and gave verbal consent for placement of bilateral soft wrist restraints for pt safety.  Frank Simpson

## 2014-10-04 NOTE — Evaluation (Addendum)
Speech Language Pathology Evaluation Patient Details Name: Frank Simpson MRN: [id number] DOB: 1931-06-15 Today's Date: 10/04/2014 Time: 1210-1252 SLP Time Calculation (min): 42 min  Problem List:  Patient Active Problem List   Diagnosis Date Noted  . TBI (traumatic brain injury) 10/03/2014  . Syncope 10/03/2014  . Junctional bradycardia 10/03/2014   Past Medical History:  Past Medical History  Diagnosis Date  . Diabetes mellitus without complication   . CHF (congestive heart failure)   . Postherpetic neuralgia   . Dyslipidemia   . Ischemic heart disease   . Benign positional vertigo   . Shingles   . Myocardial infarction   . Coronary artery disease   . Arthritis   . Cancer    Past Surgical History:  Past Surgical History  Procedure Laterality Date  . Penile prosthesis implant    . Eye surgery    . Joint replacement    . Fracture surgery      foot, pin placement   HPI:  Pt is a 80 y.o. male with PMH of frequent falls, CAD, DM2, ?congestive heart failure, dyslipidemia, MI. He was feeling dizzy and fell while working in the yard with his wife. She sent him to go back to the house and he fell again on the way, striking his head on a tractor. EMS was called and he was unresponsive for them. He came in as a level 1 trauma. He was somewhat responsive here but still had a low GCS and was intubated by the EDP on 11/6, extubated 11/7 in a.m. Bedside swallow eval ordered to evaluate swallow function post-extubation. No evidence for acute intracranial abnormality.   Assessment / Plan / Recommendation Clinical Impression  Pt presents with motor speech and expressive language within normal limits; currently demonstrating mild- moderate cognitive deficits in the areas of short term memory, problem solving, following 2-3 step directions, and some perseveration resulting in difficulties answering complex questions. ? safety awareness- pt states that he was walking with a cane and does not  anticipate any new difficulties with mobility. Given these areas of deficit, pt's safety may be at risk. Recommend continued speech therapy to address these areas of cognition to increase independence upon return home. Will continue to follow.    SLP Assessment  Patient needs continued Speech Lanaguage Pathology Services    Follow Up Recommendations  Inpatient Rehab    Frequency and Duration min 2x/week  2 weeks   Pertinent Vitals/Pain Pain Assessment: No/denies pain   SLP Goals  Potential to Achieve Goals: Good  SLP Evaluation Prior Functioning  Cognitive/Linguistic Baseline: Baseline deficits Baseline deficit details: Pt and wife reported mild memory difficulties at baseline Type of Home: House  Lives With: Spouse Available Help at Discharge: Family;Available 24 hours/day   Cognition  Overall Cognitive Status: Impaired/Different from baseline Arousal/Alertness: Awake/alert Orientation Level: Oriented to person;Oriented to place;Oriented to situation;Disoriented to time Attention: Selective Selective Attention: Appears intact Memory: Impaired Memory Impairment: Decreased recall of new information;Decreased short term memory Decreased Short Term Memory: Verbal basic;Functional basic Awareness: Appears intact Problem Solving: Impaired Problem Solving Impairment: Verbal complex Behaviors: Perseveration Safety/Judgment: Impaired    Comprehension  Auditory Comprehension Overall Auditory Comprehension: Impaired Yes/No Questions: Within Functional Limits Commands: Impaired One Step Basic Commands: 75-100% accurate Two Step Basic Commands: 50-74% accurate Conversation: Complex EffectiveTechniques: Repetition;Visual/Gestural cues Reading Comprehension Reading Status: Unable to assess (comment) (glasses unavailable)    Expression Expression Primary Mode of Expression: Verbal Verbal Expression Overall Verbal Expression: Appears within functional limits for tasks  assessed Initiation: No impairment Level of Generative/Spontaneous Verbalization: Conversation Repetition: No impairment Naming: No impairment Pragmatics: No impairment Non-Verbal Means of Communication: Not applicable   Oral / Motor Oral Motor/Sensory Function Overall Oral Motor/Sensory Function: Impaired Labial ROM: Reduced right Labial Symmetry: Abnormal symmetry right Labial Strength: Within Functional Limits Labial Sensation: Within Functional Limits Lingual ROM: Within Functional Limits Lingual Symmetry: Within Functional Limits Lingual Strength: Within Functional Limits Lingual Sensation: Within Functional Limits Motor Speech Overall Motor Speech: Appears within functional limits for tasks assessed   GO     Kern Reap, MA, CCC-SLP 10/04/2014, 1:09 PM

## 2014-10-04 NOTE — Evaluation (Signed)
Clinical/Bedside Swallow Evaluation Patient Details  Name: Frank Simpson MRN: [id number] Date of Birth: Sep 05, 1931  Today's Date: 10/04/2014 Time: 1210-1252 SLP Time Calculation (min): 42 min  Past Medical History:  Past Medical History  Diagnosis Date  . Diabetes mellitus without complication   . CHF (congestive heart failure)   . Postherpetic neuralgia   . Dyslipidemia   . Ischemic heart disease   . Benign positional vertigo   . Shingles   . Myocardial infarction   . Coronary artery disease   . Arthritis   . Cancer    Past Surgical History:  Past Surgical History  Procedure Laterality Date  . Penile prosthesis implant    . Eye surgery    . Joint replacement    . Fracture surgery      foot, pin placement   HPI:  Pt is a 80 y.o. male with PMH of frequent falls, CAD, DM2, ?congestive heart failure, dyslipidemia, MI. He was feeling dizzy and fell while working in the yard with his wife. She sent him to go back to the house and he fell again on the way, striking his head on a tractor. EMS was called and he was unresponsive for them. He came in as a level 1 trauma. He was somewhat responsive here but still had a low GCS and was intubated by the EDP on 11/6, extubated 11/7 in a.m. Bedside swallow eval ordered to evaluate swallow function post-extubation. No evidence for acute intracranial abnormality.   Assessment / Plan / Recommendation Clinical Impression  Pt demonstrated immediate cough x1 following large liquid bolus from straw at very end of evaluation. Pt and wife reported that this happens on occasion, less frequent than every meal, more often with foods than liquids. Pt required verbal cueing to take small bites/ sips putting him at increased risk of aspiration, along with recent extubation. Recommend regular diet/ thin liquids, meds whole with liquid unless difficulties arise, in which case try with puree, provide intermittent supervision to cue pt to take small bites/ sips.  ST will check on swallowing at least x1 to review strategies and assess diet tolerance.     Aspiration Risk  Mild    Diet Recommendation Regular;Thin liquid   Liquid Administration via: Cup;Straw Medication Administration: Whole meds with liquid Supervision: Intermittent supervision to cue for compensatory strategies Compensations: Slow rate;Small sips/bites;Follow solids with liquid Postural Changes and/or Swallow Maneuvers: Seated upright 90 degrees;Upright 30-60 min after meal    Other  Recommendations Oral Care Recommendations: Oral care BID   Follow Up Recommendations  Inpatient Rehab    Frequency and Duration min 1 x/week  1 week   Pertinent Vitals/Pain n/a    SLP Swallow Goals     Swallow Study Prior Functional Status       General HPI: Pt is a 80 y.o. male with PMH of frequent falls, CAD, DM2, ?congestive heart failure, dyslipidemia, MI. He was feeling dizzy and fell while working in the yard with his wife. She sent him to go back to the house and he fell again on the way, striking his head on a tractor. EMS was called and he was unresponsive for them. He came in as a level 1 trauma. He was somewhat responsive here but still had a low GCS and was intubated by the EDP on 11/6, extubated 11/7 in a.m. Bedside swallow eval ordered to evaluate swallow function post-extubation. No evidence for acute intracranial abnormality. Type of Study: Bedside swallow evaluation Diet Prior to this Study: Other (  Comment) (clear liquid) Temperature Spikes Noted: No Respiratory Status: Room air History of Recent Intubation: Yes Length of Intubations (days): 1 days Date extubated: 10/04/14 Behavior/Cognition: Alert;Cooperative;Pleasant mood Oral Cavity - Dentition: Adequate natural dentition Self-Feeding Abilities: Able to feed self Patient Positioning: Upright in bed Baseline Vocal Quality: Low vocal intensity Volitional Cough: Strong Volitional Swallow: Able to elicit     Oral/Motor/Sensory Function Overall Oral Motor/Sensory Function: Impaired Labial ROM: Reduced right Labial Symmetry: Abnormal symmetry right Labial Strength: Within Functional Limits Labial Sensation: Within Functional Limits Lingual ROM: Within Functional Limits Lingual Symmetry: Within Functional Limits Lingual Strength: Within Functional Limits Lingual Sensation: Within Functional Limits   Ice Chips Ice chips: Not tested   Thin Liquid Thin Liquid: Impaired Presentation: Cup;Straw Pharyngeal  Phase Impairments: Cough - Immediate (x1 thin liquid bolus)    Nectar Thick Nectar Thick Liquid: Not tested   Honey Thick Honey Thick Liquid: Not tested   Puree Puree: Within functional limits Presentation: Spoon;Self Fed   Solid   GO    Solid: Within functional limits Presentation: Self Fed       Rhya Shan K, MA, CCC-SLP 10/04/2014,12:56 PM

## 2014-10-05 DIAGNOSIS — I4430 Unspecified atrioventricular block: Secondary | ICD-10-CM

## 2014-10-05 LAB — BASIC METABOLIC PANEL
Anion gap: 11 (ref 5–15)
BUN: 15 mg/dL (ref 6–23)
CALCIUM: 8.2 mg/dL — AB (ref 8.4–10.5)
CO2: 23 meq/L (ref 19–32)
Chloride: 107 mEq/L (ref 96–112)
Creatinine, Ser: 1.49 mg/dL — ABNORMAL HIGH (ref 0.50–1.35)
GFR calc Af Amer: 49 mL/min — ABNORMAL LOW (ref >90)
GFR, EST NON AFRICAN AMERICAN: 42 mL/min — AB (ref >90)
Glucose, Bld: 136 mg/dL — ABNORMAL HIGH (ref 70–99)
POTASSIUM: 4.1 meq/L (ref 3.7–5.3)
SODIUM: 141 meq/L (ref 137–147)

## 2014-10-05 LAB — GLUCOSE, CAPILLARY
GLUCOSE-CAPILLARY: 116 mg/dL — AB (ref 70–99)
GLUCOSE-CAPILLARY: 125 mg/dL — AB (ref 70–99)
GLUCOSE-CAPILLARY: 167 mg/dL — AB (ref 70–99)
GLUCOSE-CAPILLARY: 223 mg/dL — AB (ref 70–99)
Glucose-Capillary: 167 mg/dL — ABNORMAL HIGH (ref 70–99)
Glucose-Capillary: 249 mg/dL — ABNORMAL HIGH (ref 70–99)
Glucose-Capillary: 264 mg/dL — ABNORMAL HIGH (ref 70–99)

## 2014-10-05 LAB — CBC
HEMATOCRIT: 38.5 % — AB (ref 39.0–52.0)
HEMOGLOBIN: 13.4 g/dL (ref 13.0–17.0)
MCH: 31.4 pg (ref 26.0–34.0)
MCHC: 34.8 g/dL (ref 30.0–36.0)
MCV: 90.2 fL (ref 78.0–100.0)
Platelets: 121 10*3/uL — ABNORMAL LOW (ref 150–400)
RBC: 4.27 MIL/uL (ref 4.22–5.81)
RDW: 13.1 % (ref 11.5–15.5)
WBC: 7.8 10*3/uL (ref 4.0–10.5)

## 2014-10-05 LAB — TYPE AND SCREEN
ABO/RH(D): B POS
Antibody Screen: NEGATIVE
UNIT DIVISION: 0
Unit division: 0

## 2014-10-05 MED ORDER — CETYLPYRIDINIUM CHLORIDE 0.05 % MT LIQD
7.0000 mL | Freq: Two times a day (BID) | OROMUCOSAL | Status: DC
  Administered 2014-10-05 – 2014-10-07 (×4): 7 mL via OROMUCOSAL

## 2014-10-05 MED ORDER — VANCOMYCIN HCL IN DEXTROSE 1-5 GM/200ML-% IV SOLN
1000.0000 mg | INTRAVENOUS | Status: DC
  Filled 2014-10-05: qty 200

## 2014-10-05 MED ORDER — SODIUM CHLORIDE 0.9 % IV SOLN
INTRAVENOUS | Status: DC
  Administered 2014-10-06: 13:00:00 via INTRAVENOUS

## 2014-10-05 MED ORDER — SODIUM CHLORIDE 0.9 % IR SOLN
80.0000 mg | Status: DC
  Filled 2014-10-05 (×2): qty 2

## 2014-10-05 NOTE — Progress Notes (Signed)
Subjective:   Awake and eating.  Sore, but no c/o SOB or chest pain  . antiseptic oral rinse  7 mL Mouth Rinse QID  . chlorhexidine  15 mL Mouth Rinse BID  . insulin aspart  0-15 Units Subcutaneous 6 times per day  . magic mouthwash w/lidocaine  10 mL Oral TID  . neomycin-bacitracin-polymyxin   Topical BID  . pantoprazole  40 mg Oral Daily   Or  . pantoprazole (PROTONIX) IV  40 mg Intravenous Daily    Objective:  Vital Signs in the last 24 hours: BP 149/70 mmHg  Pulse 62  Temp(Src) 98 F (36.7 C) (Oral)  Resp 26  Ht 5' 10" (1.778 m)  Wt 207 lb 0.2 oz (93.9 kg)  BMI 29.70 kg/m2  SpO2 98%  Physical Exam: Elderly WM with husky voice Head: Hematoma and trauma noted to scalp Lungs:  Clear  Cardiac:  Regular rhythm, normal S1 and S2, no S3 Extremities:  No edema present  Intake/Output from previous day: 11/07 0701 - 11/08 0700 In: 2900 [P.O.:500; I.V.:2400] Out: 3300 [Urine:3300]   Weight Filed Weights   10/03/14 1134 10/03/14 1400  Weight: 205 lb (92.987 kg) 207 lb 0.2 oz (93.9 kg)   Lab Results: Basic Metabolic Panel:  Recent Labs  10/04/14 0249 10/05/14 0312  NA 141 141  K 4.1 4.1  CL 107 107  CO2 22 23  GLUCOSE 122* 136*  BUN 21 15  CREATININE 1.56* 1.49*    CBC:  Recent Labs  10/04/14 0249 10/05/14 0312  WBC 9.7 7.8  HGB 14.6 13.4  HCT 41.4 38.5*  MCV 89.6 90.2  PLT 144* 121*   BNP No results found for: PROBNP  PROTIME: Lab Results  Component Value Date   INR 1.06 10/03/2014   Telemetry: Sinus rhythm and bradycardia, no further junctional rhythm    Assessment/Plan:    80 year old male admitted after syncopal episode with facial laceration. He has had multiple falls lately.  Today he has multiple 2:1 block episode with one 4:1 episode with 3.8 second pause. I have discussed this with Dr Lovena Le. He will require a permanent pacemaker, most probably tomorrow.  LVEF 55-60%, narrow QRS, no electrolyte abnormality, no AVN  blocking agent.  H/o CAD - stable   Frank Simpson H 10/05/2014, 11:14 AM

## 2014-10-05 NOTE — Progress Notes (Signed)
Subjective: Sleeping but arousable, somnolent. Knows where he is  Objective: Vital signs in last 24 hours: Temp:  [97.4 F (36.3 C)-99.2 F (37.3 C)] 98 F (36.7 C) (11/08 0800) Pulse Rate:  [53-84] 56 (11/08 0700) Resp:  [17-46] 27 (11/08 0700) BP: (115-165)/(56-94) 148/71 mmHg (11/08 0700) SpO2:  [92 %-100 %] 99 % (11/08 0700)    Intake/Output from previous day: 11/07 0701 - 11/08 0700 In: 2900 [P.O.:500; I.V.:2400] Out: 3300 [Urine:3300] Intake/Output this shift:    Lungs clear Abdomen soft, NT/ND arousable  Lab Results:   Recent Labs  10/04/14 0249 10/05/14 0312  WBC 9.7 7.8  HGB 14.6 13.4  HCT 41.4 38.5*  PLT 144* 121*   BMET  Recent Labs  10/04/14 0249 10/05/14 0312  NA 141 141  K 4.1 4.1  CL 107 107  CO2 22 23  GLUCOSE 122* 136*  BUN 21 15  CREATININE 1.56* 1.49*  CALCIUM 8.4 8.2*   PT/INR  Recent Labs  10/03/14 1133  LABPROT 13.9  INR 1.06   ABG  Recent Labs  10/03/14 1343  PHART 7.339*  HCO3 23.2    Studies/Results: Ct Head Wo Contrast  10/03/2014   CLINICAL DATA:  Level 1 trauma.  Fell and hit head on tractor.  EXAM: CT HEAD WITHOUT CONTRAST  CT MAXILLOFACIAL WITHOUT CONTRAST  CT CERVICAL SPINE WITHOUT CONTRAST  TECHNIQUE: Multidetector CT imaging of the head, cervical spine, and maxillofacial structures were performed using the standard protocol without intravenous contrast. Multiplanar CT image reconstructions of the cervical spine and maxillofacial structures were also generated.  COMPARISON:  None.  FINDINGS: CT HEAD FINDINGS  There is no intra or extra-axial fluid collection or mass lesion. The basilar cisterns and ventricles have a normal appearance. There is no CT evidence for acute infarction or hemorrhage. There is central and cortical atrophy. Mild periventricular white matter changes are consistent with small vessel disease.  Bone windows show right frontal scalp hematoma without associated underlying calvarial fracture.  There is mild mucoperiosteal thickening of the ethmoid and axillary sinuses.  CT MAXILLOFACIAL FINDINGS  The globes, orbits are intact. The zygomatic arches, mandible, and temporomandibular joints are intact. Nasal bones and bony nasal septum are intact. Pterygoid plates and skullbase are intact.  Patient has endotracheal tube. Nasogastric tube is coiled in the patient's mass. There are changes of chronic sinusitis involving the ethmoid and maxillary sinuses. No air-fluid levels are identified within the paranasal sinuses. No mastoid effusions. There is cerumen within the right external auditory canal.  CT CERVICAL SPINE FINDINGS  There is normal alignment of the cervical spine. There is no evidence for acute fracture or dislocation. Prevertebral soft tissues have a normal appearance. Lung apices have a normal appearance. There is atherosclerotic calcification of the carotid arteries.  IMPRESSION: 1. Atrophy and small vessel disease. 2.  No evidence for acute intracranial abnormality. 3. Right frontal scalp hematoma. 4. Chronic sinusitis. 5. Orogastric tube coiled within the patient's mouth. 6. No evidence for acute fracture of the maxillofacial bones. 7. Degenerative changes without evidence for acute abnormality of the cervical spine.   Electronically Signed   By: Shon Hale M.D.   On: 10/03/2014 12:48   Ct Chest W Contrast  10/03/2014   CLINICAL DATA:  Level 1 trauma. The patient fell and hit head on tractor. Right frontal scalp hematoma.  EXAM: CT CHEST, ABDOMEN, AND PELVIS WITH CONTRAST  TECHNIQUE: Multidetector CT imaging of the chest, abdomen and pelvis was performed following the standard protocol during  bolus administration of intravenous contrast.  CONTRAST:  80 cc Omnipaque 300  COMPARISON:  Chest x-ray and AP pelvis on 10/03/2014  FINDINGS: CT CHEST FINDINGS  Heart: The heart is mildly enlarged. No pericardial effusion. There is dense atherosclerotic calcification of the coronary arteries.  Vascular  structures: The aorta is tortuous and calcified. It ascending aorta is ectatic, measuring up to 3 cm in diameter. No evidence for vascular injury.  Mediastinum/thyroid: No mediastinal, hilar, or axillary adenopathy. Endotracheal tube is in place with tip above the carina. Nasogastric tube is in place to the stomach.  Lungs/Airways: There is bibasilar dependent change. No pleural effusions. No pneumothorax. Prior right shoulder tenderness repair.  Chest wall/osseous structures: No acute displaced fractures are identified. There is moderate degenerative change throughout the thoracic spine. Sternum is intact.  CT ABDOMEN AND PELVIS FINDINGS  Upper abdomen: No focal abnormality identified within the liver, spleen, pancreas, or adrenal glands. The kidneys show symmetric excretion bilaterally without focal mass. Gallbladder is present.  Gastrointestinal tract: And the stomach contains nasogastric tube. Small bowel loops are normal in appearance. There are scattered colonic diverticula but no evidence for acute diverticulitis or colonic injury. The appendix is well seen and has a normal appearance.  Pelvis: Patient has a penile prosthesis. The urinary bladder has a normal appearance. Prostatic calcifications are present. No free pelvic fluid.  Retroperitoneum: No retroperitoneal or mesenteric adenopathy. There is dense atherosclerotic calcification of the abdominal aorta. No aneurysm or dissection.  Abdominal wall: No acute abnormality.  Osseous structures: There is an old wedge compression fracture of L2 with significant loss of height. Significant osteophytes are identified throughout the lower thoracic and lumbar spine. No suspicious lytic or blastic lesions are identified.  IMPRESSION: 1. Cardiomegaly and dense atherosclerotic calcification of the coronary arteries and aorta. 2. No evidence for vascular injury. 3. Dependent change within the lungs bilaterally. 4. No evidence for acute displaced fracture. 5. No  evidence for injury of the solid organs or bowel. 6. Nasogastric tube to the stomach. 7. Endotracheal to to the lower trachea. 8. Old wedge compression fracture of L2.   Electronically Signed   By: Shon Hale M.D.   On: 10/03/2014 12:42   Ct Cervical Spine Wo Contrast  10/03/2014   CLINICAL DATA:  Level 1 trauma.  Fell and hit head on tractor.  EXAM: CT HEAD WITHOUT CONTRAST  CT MAXILLOFACIAL WITHOUT CONTRAST  CT CERVICAL SPINE WITHOUT CONTRAST  TECHNIQUE: Multidetector CT imaging of the head, cervical spine, and maxillofacial structures were performed using the standard protocol without intravenous contrast. Multiplanar CT image reconstructions of the cervical spine and maxillofacial structures were also generated.  COMPARISON:  None.  FINDINGS: CT HEAD FINDINGS  There is no intra or extra-axial fluid collection or mass lesion. The basilar cisterns and ventricles have a normal appearance. There is no CT evidence for acute infarction or hemorrhage. There is central and cortical atrophy. Mild periventricular white matter changes are consistent with small vessel disease.  Bone windows show right frontal scalp hematoma without associated underlying calvarial fracture. There is mild mucoperiosteal thickening of the ethmoid and axillary sinuses.  CT MAXILLOFACIAL FINDINGS  The globes, orbits are intact. The zygomatic arches, mandible, and temporomandibular joints are intact. Nasal bones and bony nasal septum are intact. Pterygoid plates and skullbase are intact.  Patient has endotracheal tube. Nasogastric tube is coiled in the patient's mass. There are changes of chronic sinusitis involving the ethmoid and maxillary sinuses. No air-fluid levels are identified within the  paranasal sinuses. No mastoid effusions. There is cerumen within the right external auditory canal.  CT CERVICAL SPINE FINDINGS  There is normal alignment of the cervical spine. There is no evidence for acute fracture or dislocation. Prevertebral soft  tissues have a normal appearance. Lung apices have a normal appearance. There is atherosclerotic calcification of the carotid arteries.  IMPRESSION: 1. Atrophy and small vessel disease. 2.  No evidence for acute intracranial abnormality. 3. Right frontal scalp hematoma. 4. Chronic sinusitis. 5. Orogastric tube coiled within the patient's mouth. 6. No evidence for acute fracture of the maxillofacial bones. 7. Degenerative changes without evidence for acute abnormality of the cervical spine.   Electronically Signed   By: Shon Hale M.D.   On: 10/03/2014 12:48   Ct Abdomen Pelvis W Contrast  10/03/2014   CLINICAL DATA:  Level 1 trauma. The patient fell and hit head on tractor. Right frontal scalp hematoma.  EXAM: CT CHEST, ABDOMEN, AND PELVIS WITH CONTRAST  TECHNIQUE: Multidetector CT imaging of the chest, abdomen and pelvis was performed following the standard protocol during bolus administration of intravenous contrast.  CONTRAST:  80 cc Omnipaque 300  COMPARISON:  Chest x-ray and AP pelvis on 10/03/2014  FINDINGS: CT CHEST FINDINGS  Heart: The heart is mildly enlarged. No pericardial effusion. There is dense atherosclerotic calcification of the coronary arteries.  Vascular structures: The aorta is tortuous and calcified. It ascending aorta is ectatic, measuring up to 3 cm in diameter. No evidence for vascular injury.  Mediastinum/thyroid: No mediastinal, hilar, or axillary adenopathy. Endotracheal tube is in place with tip above the carina. Nasogastric tube is in place to the stomach.  Lungs/Airways: There is bibasilar dependent change. No pleural effusions. No pneumothorax. Prior right shoulder tenderness repair.  Chest wall/osseous structures: No acute displaced fractures are identified. There is moderate degenerative change throughout the thoracic spine. Sternum is intact.  CT ABDOMEN AND PELVIS FINDINGS  Upper abdomen: No focal abnormality identified within the liver, spleen, pancreas, or adrenal glands. The  kidneys show symmetric excretion bilaterally without focal mass. Gallbladder is present.  Gastrointestinal tract: And the stomach contains nasogastric tube. Small bowel loops are normal in appearance. There are scattered colonic diverticula but no evidence for acute diverticulitis or colonic injury. The appendix is well seen and has a normal appearance.  Pelvis: Patient has a penile prosthesis. The urinary bladder has a normal appearance. Prostatic calcifications are present. No free pelvic fluid.  Retroperitoneum: No retroperitoneal or mesenteric adenopathy. There is dense atherosclerotic calcification of the abdominal aorta. No aneurysm or dissection.  Abdominal wall: No acute abnormality.  Osseous structures: There is an old wedge compression fracture of L2 with significant loss of height. Significant osteophytes are identified throughout the lower thoracic and lumbar spine. No suspicious lytic or blastic lesions are identified.  IMPRESSION: 1. Cardiomegaly and dense atherosclerotic calcification of the coronary arteries and aorta. 2. No evidence for vascular injury. 3. Dependent change within the lungs bilaterally. 4. No evidence for acute displaced fracture. 5. No evidence for injury of the solid organs or bowel. 6. Nasogastric tube to the stomach. 7. Endotracheal to to the lower trachea. 8. Old wedge compression fracture of L2.   Electronically Signed   By: Shon Hale M.D.   On: 10/03/2014 12:42   Dg Pelvis Portable  10/03/2014   CLINICAL DATA:  Fall from tractor.  Trauma  EXAM: PORTABLE PELVIS 1-2 VIEWS  COMPARISON:  None.  FINDINGS: There is no evidence of pelvic fracture or diastasis. No  pelvic bone lesions are seen. Penile prosthesis  IMPRESSION: Negative.   Electronically Signed   By: Franchot Gallo M.D.   On: 10/03/2014 11:49   Dg Chest Portable 1 View  10/03/2014   CLINICAL DATA:  80 year old male status post fall from tractor. Level 1 trauma. Initial encounter.  EXAM: PORTABLE CHEST - 1 VIEW   COMPARISON:  None.  FINDINGS: Portable AP supine view at 1123 hrs. Intubated. Endotracheal tube tip in good position just below the clavicles. Mildly rotated to the right. Normal cardiac size and mediastinal contours. No pneumothorax, pleural effusion, or pulmonary contusion identified on this supine view. No acute osseous injury identified in the visible thorax.  IMPRESSION: 1. Intubated, ET tube in good position. 2.  No acute traumatic injury identified.   Electronically Signed   By: Lars Pinks M.D.   On: 10/03/2014 11:49   Ct Maxillofacial Wo Cm  10/03/2014   CLINICAL DATA:  Level 1 trauma.  Fell and hit head on tractor.  EXAM: CT HEAD WITHOUT CONTRAST  CT MAXILLOFACIAL WITHOUT CONTRAST  CT CERVICAL SPINE WITHOUT CONTRAST  TECHNIQUE: Multidetector CT imaging of the head, cervical spine, and maxillofacial structures were performed using the standard protocol without intravenous contrast. Multiplanar CT image reconstructions of the cervical spine and maxillofacial structures were also generated.  COMPARISON:  None.  FINDINGS: CT HEAD FINDINGS  There is no intra or extra-axial fluid collection or mass lesion. The basilar cisterns and ventricles have a normal appearance. There is no CT evidence for acute infarction or hemorrhage. There is central and cortical atrophy. Mild periventricular white matter changes are consistent with small vessel disease.  Bone windows show right frontal scalp hematoma without associated underlying calvarial fracture. There is mild mucoperiosteal thickening of the ethmoid and axillary sinuses.  CT MAXILLOFACIAL FINDINGS  The globes, orbits are intact. The zygomatic arches, mandible, and temporomandibular joints are intact. Nasal bones and bony nasal septum are intact. Pterygoid plates and skullbase are intact.  Patient has endotracheal tube. Nasogastric tube is coiled in the patient's mass. There are changes of chronic sinusitis involving the ethmoid and maxillary sinuses. No air-fluid  levels are identified within the paranasal sinuses. No mastoid effusions. There is cerumen within the right external auditory canal.  CT CERVICAL SPINE FINDINGS  There is normal alignment of the cervical spine. There is no evidence for acute fracture or dislocation. Prevertebral soft tissues have a normal appearance. Lung apices have a normal appearance. There is atherosclerotic calcification of the carotid arteries.  IMPRESSION: 1. Atrophy and small vessel disease. 2.  No evidence for acute intracranial abnormality. 3. Right frontal scalp hematoma. 4. Chronic sinusitis. 5. Orogastric tube coiled within the patient's mouth. 6. No evidence for acute fracture of the maxillofacial bones. 7. Degenerative changes without evidence for acute abnormality of the cervical spine.   Electronically Signed   By: Shon Hale M.D.   On: 10/03/2014 12:48    Anti-infectives: Anti-infectives    None      Assessment/Plan:  Continuing TBI care Restraints as needed   LOS: 2 days    Adelyn Roscher A 10/05/2014

## 2014-10-05 NOTE — Evaluation (Signed)
Physical Therapy Evaluation Patient Details Name: Frank Simpson MRN: [id number] DOB: Jan 05, 1931 Today's Date: 10/05/2014   History of Present Illness  Pt is a 80 y.o. male with PMH of frequent falls, CAD, DM2, ?congestive heart failure, dyslipidemia, MI. He was feeling dizzy and fell while working in the yard with his wife. She sent him to go back to the house and he fell again on the way, striking his head on a tractor. EMS was called and he was unresponsive for them. He came in as a level 1 trauma. He was somewhat responsive here but still had a low GCS and was intubated by the EDP on 11/6, extubated 11/7 in a.m.  Clinical Impression  Pt adm due to above. Appears to be at [facility]. Pt presents with decreased independence with functional mobility secondary to deficits indicated below (see PT problem list). Pt to benefit from skilled acute PT to address deficits and maximize mobility prior to D/C to next venue. PTA pt independent with ADLs and mobility. Recommend CIR for post acute rehab prior to returning home. Pt limited to sitting EOB for brief period of time then experiencing a syncopal like episode. Pt returned to supine and communicating appropriately. All  VSS throughoust session. Pt planned for pacer tomorrow per RN.     Follow Up Recommendations CIR    Equipment Recommendations  Other (comment) (TBD)    Recommendations for Other Services OT consult;Rehab consult     Precautions / Restrictions Precautions Precautions: Fall Precaution Comments: multiple falls at home  Restrictions Weight Bearing Restrictions: No      Mobility  Bed Mobility Overal bed mobility: Needs Assistance Bed Mobility: Supine to Sit;Rolling;Sit to Supine Rolling: Min guard   Supine to sit: Mod assist;HOB elevated Sit to supine: Max assist;+2 for physical assistance   General bed mobility comments: pt bring LEs off EOB but difficulty sequencing hand placement; requiring hand over hand (A);  mod (A) to bring hips to EOB and elevate trunk; pt requiring 2 person (A) to return to supine due to becoming unresponsive/ delayed responses EOB; pt reposiioned in bed  Transfers                 General transfer comment: did not assess today; pt with syncopal like episode at EOB and returned to supine; pt responding appropriately and immediately once in supine positon   Ambulation/Gait                Stairs            Wheelchair Mobility    Modified Rankin (Stroke Patients Only) Modified Rankin (Stroke Patients Only) Pre-Morbid Rankin Score: No symptoms Modified Rankin: Severe disability     Balance Overall balance assessment: History of Falls;Needs assistance Sitting-balance support: Feet supported;Single extremity supported Sitting balance-Leahy Scale: Poor Sitting balance - Comments: sat EOB <1 min and became unresponsive                                      Pertinent Vitals/Pain Pain Assessment: No/denies pain    Home Living Family/patient expects to be discharged to:: Private residence Living Arrangements: Spouse/significant other Available Help at Discharge: Family;Available 24 hours/day Type of Home: House Home Access: Stairs to enter   [employer] of Steps:  (family present unsure as to how many)          Prior Function Level of Independence: Independent  Hand Dominance        Extremity/Trunk Assessment   Upper Extremity Assessment: Defer to OT evaluation           Lower Extremity Assessment: Generalized weakness      Cervical / Trunk Assessment: Normal  Communication   Communication: No difficulties (delayed responses at times but appropriate)  Cognition Arousal/Alertness: Lethargic;Suspect due to medications Behavior During Therapy: Flat affect;Impulsive Overall Cognitive Status: Impaired/Different from baseline Area of Impairment: Orientation;Attention;Memory;Following  commands;Safety/judgement;Problem solving;[place] level Orientation Level: Disoriented to;Time Current Attention Level: Sustained Memory: Decreased short-term memory Following Commands: Follows one step commands consistently Safety/Judgement: Decreased awareness of deficits;Decreased awareness of safety   Problem Solving: Slow processing;Requires verbal cues;Requires tactile cues;Difficulty sequencing General Comments: pt with delayed responses at times verbally; all responses were appropriate; attention decreased and requiring max cues for staying on task and safety; pt attempting multiple times to exit bed and getting tangled in lines/lead    General Comments General comments (skin integrity, edema, etc.): VSS during session; scalp laceration     Exercises General Exercises - Lower Extremity Ankle Circles/Pumps: AROM;Both;10 reps;Supine      Assessment/Plan    PT Assessment Patient needs continued PT services  PT Diagnosis Difficulty walking;Generalized weakness;Acute pain;Altered mental status   PT Problem List Decreased strength;Decreased activity tolerance;Decreased balance;Decreased mobility;Decreased cognition;Decreased knowledge of use of DME;Decreased safety awareness;Decreased knowledge of precautions;Pain  PT Treatment Interventions DME instruction;Gait training;Functional mobility training;Therapeutic activities;Balance training;Therapeutic exercise;Neuromuscular re-education;Patient/family education   PT Goals (Current goals can be found in the Care Plan section) Acute Rehab PT Goals Patient Stated Goal: to get back to my work at home PT Goal Formulation: With patient Time For Goal Achievement: 10/12/14 Potential to Achieve Goals: Good    Frequency Min 3X/week   Barriers to discharge Inaccessible home environment STE home    Co-evaluation               End of Session Equipment Utilized During Treatment: Oxygen Activity Tolerance: Patient limited by  lethargy Patient left: in bed;with bed alarm set;with call bell/phone within reach Nurse Communication: Mobility status;Precautions         Time: 1359-1415 PT Time Calculation (min): 16 min   Charges:   PT Evaluation $Initial PT Evaluation Tier I: 1 Procedure PT Treatments $Therapeutic Activity: 8-22 mins   PT G CodesGustavus Bryant, [place]  [phone] 10/05/2014, 3:15 PM

## 2014-10-06 ENCOUNTER — Inpatient Hospital Stay (HOSPITAL_COMMUNITY): Payer: Medicare Other

## 2014-10-06 ENCOUNTER — Encounter (HOSPITAL_COMMUNITY): Admission: EM | Disposition: A | Discharge status: Rehabilitation Facility | Payer: Self-pay | Source: Home / Self Care

## 2014-10-06 DIAGNOSIS — W19XXXA Unspecified fall, initial encounter: Secondary | ICD-10-CM | POA: Insufficient documentation

## 2014-10-06 DIAGNOSIS — W19XXXS Unspecified fall, sequela: Secondary | ICD-10-CM

## 2014-10-06 DIAGNOSIS — S069X2S Unspecified intracranial injury with loss of consciousness of 31 minutes to 59 minutes, sequela: Secondary | ICD-10-CM

## 2014-10-06 DIAGNOSIS — I442 Atrioventricular block, complete: Secondary | ICD-10-CM

## 2014-10-06 HISTORY — PX: PERMANENT PACEMAKER INSERTION: SHX5480

## 2014-10-06 LAB — GLUCOSE, CAPILLARY
GLUCOSE-CAPILLARY: 123 mg/dL — AB (ref 70–99)
GLUCOSE-CAPILLARY: 136 mg/dL — AB (ref 70–99)
GLUCOSE-CAPILLARY: 169 mg/dL — AB (ref 70–99)
GLUCOSE-CAPILLARY: 183 mg/dL — AB (ref 70–99)
GLUCOSE-CAPILLARY: 211 mg/dL — AB (ref 70–99)
Glucose-Capillary: 183 mg/dL — ABNORMAL HIGH (ref 70–99)
Glucose-Capillary: 127 mg/dL — ABNORMAL HIGH (ref 70–99)

## 2014-10-06 LAB — TRIGLYCERIDES: Triglycerides: 92 mg/dL (ref <150)

## 2014-10-06 LAB — BLOOD PRODUCT ORDER (VERBAL) VERIFICATION

## 2014-10-06 SURGERY — PERMANENT PACEMAKER INSERTION
Anesthesia: LOCAL

## 2014-10-06 MED ORDER — MIDAZOLAM HCL 5 MG/5ML IJ SOLN
INTRAMUSCULAR | Status: AC
  Filled 2014-10-06: qty 5

## 2014-10-06 MED ORDER — ACETAMINOPHEN 500 MG PO TABS: 500.0000 mg | ORAL_TABLET | Freq: Four times a day (QID) | ORAL | Status: DC | PRN

## 2014-10-06 MED ORDER — AMLODIPINE BESYLATE 5 MG PO TABS
5.0000 mg | ORAL_TABLET | Freq: Every day | ORAL | Status: DC
  Administered 2014-10-06 – 2014-10-07 (×2): 5 mg via ORAL
  Filled 2014-10-06 (×3): qty 1

## 2014-10-06 MED ORDER — VANCOMYCIN HCL IN DEXTROSE 1-5 GM/200ML-% IV SOLN
1000.0000 mg | Freq: Two times a day (BID) | INTRAVENOUS | Status: AC
  Administered 2014-10-07: 1000 mg via INTRAVENOUS
  Filled 2014-10-06: qty 200

## 2014-10-06 MED ORDER — VANCOMYCIN HCL IN DEXTROSE 1-5 GM/200ML-% IV SOLN
1000.0000 mg | INTRAVENOUS | Status: DC
  Filled 2014-10-06: qty 200

## 2014-10-06 MED ORDER — SODIUM CHLORIDE 0.9 % IV SOLN: INTRAVENOUS | Status: DC

## 2014-10-06 MED ORDER — LIDOCAINE HCL (PF) 1 % IJ SOLN
INTRAMUSCULAR | Status: AC
  Filled 2014-10-06: qty 60

## 2014-10-06 MED ORDER — ONDANSETRON HCL 4 MG/2ML IJ SOLN: 4.0000 mg | Freq: Four times a day (QID) | INTRAMUSCULAR | Status: DC | PRN

## 2014-10-06 MED ORDER — SODIUM CHLORIDE 0.9 % IR SOLN
80.0000 mg | Status: DC
  Filled 2014-10-06: qty 2

## 2014-10-06 MED ORDER — ACETAMINOPHEN 325 MG PO TABS: 325.0000 mg | ORAL_TABLET | ORAL | Status: DC | PRN

## 2014-10-06 MED ORDER — HEPARIN (PORCINE) IN NACL 2-0.9 UNIT/ML-% IJ SOLN
INTRAMUSCULAR | Status: AC
  Filled 2014-10-06: qty 500

## 2014-10-06 MED ORDER — FENTANYL CITRATE 0.05 MG/ML IJ SOLN
INTRAMUSCULAR | Status: AC
  Filled 2014-10-06: qty 2

## 2014-10-06 MED ORDER — CYANOCOBALAMIN 1000 MCG/ML IJ SOLN
1000.0000 ug | Freq: Once | INTRAMUSCULAR | Status: DC
  Filled 2014-10-06: qty 1

## 2014-10-06 NOTE — Progress Notes (Signed)
Rehab Admissions Coordinator Note:  Patient was screened by Retta Diones for appropriateness for an Inpatient Acute Rehab Consult.  At this time, we are recommending Inpatient Rehab consult.  Retta Diones 10/06/2014, 9:16 AM  I can be reached at [phone].

## 2014-10-06 NOTE — CV Procedure (Signed)
SURGEON:  Cristopher Peru, MD     PREPROCEDURE DIAGNOSIS:  Symptomatic Bradycardia (syncope)due to intermittent CHB and documented pauses of over 8 seconds due to CHB    POSTPROCEDURE DIAGNOSIS:  Same as preprocedure diagnosis     PROCEDURES:   1. Pacemaker implantation.     INTRODUCTION: Frank Simpson is a 80 y.o. male  with a history of syncope and documented complete heart block with 8 second pauses who presents today for pacemaker implantation.  The patient reports intermittent episodes of syncope over the past few months.  No reversible causes have been identified.  The patient therefore presents today for pacemaker implantation.     DESCRIPTION OF PROCEDURE:  Informed written consent was obtained, and   the patient was brought to the electrophysiology lab in a fasting state.  The patient required no sedation for the procedure today.  The patients left chest was prepped and draped in the usual sterile fashion by the EP lab staff. The skin overlying the left deltopectoral region was infiltrated with lidocaine for local analgesia.  A 4-cm incision was made over the left deltopectoral region.  A left subcutaneous pacemaker pocket was fashioned using a combination of sharp and blunt dissection. Electrocautery was required to assure hemostasis.     RA/RV Lead Placement: The left axillary vein was therefore directly visualized and cannulated.  Through the left axillary vein, a St. Jude [id number] (serial number [id number]) right atrial lead and a St. Jude [id number](serial number [id number]) right ventricular lead were advanced with fluoroscopic visualization into the right atrial appendage and right ventricular apical septal positions respectively.  Initial atrial lead P- waves measured 3.4 mV with impedance of 465 ohms and a threshold of 0.6 V at 0.5 msec.  Right ventricular lead R-waves measured 19 mV with an impedance of 591 ohms and a threshold of 0.6 V at 0.5 msec.  Both leads were secured to the pectoralis  fascia using #2-0 silk over the suture sleeves.   Device Placement:  The leads were then connected to a St. Jude [id number](serial number [id number]) pacemaker.  The pocket was irrigated with copious gentamicin solution.  The pacemaker was then placed into the pocket.  The pocket was then closed in 2 layers with 2.0 Vicryl suture for the subcutaneous and subcuticular layers.  Steri-Strips and a sterile dressing were then applied.  There were no early apparent complications.     CONCLUSIONS:   1. Successful implantation of a St. Jude dual-chamber pacemaker for symptomatic bradycardia due to complete heart block.  2. No early apparent complications.           Cristopher Peru, MD 10/06/2014 6:13 PM

## 2014-10-06 NOTE — Clinical Social Work Note (Signed)
Clinical Social Work Department BRIEF PSYCHOSOCIAL ASSESSMENT 10/06/2014  Patient:  Frank Simpson, Frank Simpson     Account Number:  [id number]     Admit date:  10/03/2014  Clinical Social Worker:  Myles Lipps  Date/Time:  10/06/2014 11:20 AM  Referred by:  Physician  Date Referred:  10/06/2014 Referred for  Psychosocial assessment   Other Referral:   Interview type:  Patient Other interview type:   Patient wife at bedside    PSYCHOSOCIAL DATA Living Status:  WIFE Admitted from facility:   Level of care:   Primary support name:  Frank Simpson, Frank Simpson  [phone] / [phone] Primary support relationship to patient:  SPOUSE Degree of support available:   Strong    CURRENT CONCERNS Current Concerns  Post-Acute Placement   Other Concerns:   Possible inpatient rehab admissions    SOCIAL WORK ASSESSMENT / PLAN Clinical Social Worker met with patient and patient wife at bedside to offer support and discuss patient needs at discharge.  Patient states that he walked outside to start the tractor and fell before ever getting on to the tractor. Patient does remember feeling "swimmy headed" prior to falling.  Patient states that his falls are frequent but he is usually able to get back up on his own.  Patient lives at home with his wife and would like to return home once medically cleared.  Patient wife states that their home is all on one floor and somewhat handicap accessible.    Clinical Social Worker inquired about current substance use.  Patient states that the does not drink alcohol or use any type of illegal drugs.  SBIRT complete and no further resources needed at this time.  Patient will have rehab needs following hospital admission, however patient adamantly states that he will go to inpatient rehab or home.  Patient is currently refusing SNF placement and wife is in agreement.  Patient and patient wife are hopeful with pacemaker placement, patient falls will be less frequent. CSW remains  available for support and to assist with discharge planning needs if needed.   Assessment/plan status:  Psychosocial Support/Ongoing Assessment of Needs Other assessment/ plan:   Information/referral to community resources:   SBIRT complete.  CSW available for support and to provide additional resources if needed.    PATIENT'S/FAMILY'S RESPONSE TO PLAN OF CARE: Patient alert and oriented to person and place but intermittently confused regarding date/time.  Patient with good family support emotionally, however concerns about phsycial ability to assist at home.  Patient and family verbalize understanding of CSW role and appreciation for support and concern.

## 2014-10-06 NOTE — Progress Notes (Addendum)
Rehab admissions - I met with pt in follow up to rehab MD consult to explain the possibility of inpatient rehab. Questions were answered and informational brochures were given. He is interested in pursuing inpatient rehab. Pt repeatedly stated, "I keep falling after I had that heart attack. I know when I'm going to fall, but I can't stop them." I then briefly met pt's wife in the hall and answered her initial questions. She is in agreement that inpatient rehab be pursued.  I will follow pt's status and noted that pt is currently displaying behaviors consistent with [place]. Wife shared pt is to have pacer placed, possibly today? Cardiology note does not specify when pacer is planned.  We will consider possible inpatient rehab admit pending his medical clearance and our bed availability.  Please call me with any questions. Thanks.  Nanetta Batty, PT Rehabilitation Admissions Coordinator [phone]

## 2014-10-06 NOTE — Care Management (Signed)
UR completed.  Sandi Mariscal, RN BSN Nellie CCM Trauma/Neuro ICU Case Manager [phone]

## 2014-10-06 NOTE — Evaluation (Addendum)
Occupational Therapy Evaluation/ TBI TEAM Patient Details Name: Frank Simpson MRN: [id number] DOB: 29-Jan-1931 Today's Date: 10/06/2014    History of Present Illness Pt is a 80 y.o. male with PMH of frequent falls, CAD, DM2, ?congestive heart failure, dyslipidemia, MI. He was feeling dizzy and fell while working in the yard with his wife. She sent him to go back to the house and he fell again on the way, striking his head on a tractor. EMS was called and he was unresponsive for them. He came in as a level 1 trauma. He was somewhat responsive here but still had a low GCS and was intubated by the EDP on 11/6, extubated 11/7 in a.m.   Clinical Impression   PT admitted with s/p fall with CHI TBI and bradycardia . Pt currently with functional limitiations due to the deficits listed below (see OT problem list). Pt demonstrates behavior consistently with [place] recovery level V (confused and inappropriate) PTA pt with frequent falls but MOD I with all adls. Pt lived at home with wife. Pt will benefit from skilled OT to increase their independence and safety with adls and balance to allow discharge CIR. Pt demonstrates cognitive deficits and balance deficits compared to baseline. OT to follow acutely for adl retraining with dynamic standing balance.     Follow Up Recommendations  CIR    Equipment Recommendations  None recommended by OT    Recommendations for Other Services Rehab consult     Precautions / Restrictions Precautions Precautions: Fall Precaution Comments: multiple falls at home       Mobility Bed Mobility Overal bed mobility: Needs Assistance Bed Mobility: Supine to Sit Rolling: Min guard         General bed mobility comments: pt able to progress to EOB and needs cues to place bil LE on the floor  Transfers Overall transfer level: Needs assistance Equipment used: 1 person hand held assist Transfers: Sit to/from Stand Sit to Stand: Min assist         General  transfer comment: allowed extended time at EOB prior to sit<>Stand. BP monitored and assessed. Pt HR unchanged    Balance Overall balance assessment: Needs assistance Sitting-balance support: Bilateral upper extremity supported;Feet supported Sitting balance-Leahy Scale: Fair     Standing balance support: Bilateral upper extremity supported;During functional activity Standing balance-Leahy Scale: Poor Standing balance comment: pt with x2 LOB during session                            ADL Overall ADL's : Needs assistance/impaired     Grooming: Oral care;Wash/dry face;Standing;Minimal assistance Grooming Details (indicate cue type and reason): pt requires extended time to complete oral care and incr time to open tooth paste (fine motor). pt applying tooth paste properly in correct sequence     Lower Body Bathing: Maximal assistance;Sit to/from stand Lower Body Bathing Details (indicate cue type and reason): pt with smear on incontinence of bowel and unaware. Peri care performed                     Functional mobility during ADLs: Minimal assistance (LOB with head turns) General ADL Comments: Pt completed bed mobilty, sink level grooming and brief ambulation. pt asked to scan environment for colors due to glasses missing. pt scanning for objects midline to the R mainly. But with visual assessment pt  unable to sustain horizontal R attention . Pt with lob with head turns.  Vision Eye Alignment: Within Functional Limits   Ocular Range of Motion: Within Functional Limits Tracking/Visual Pursuits: Decreased smoothness of horizontal tracking;Requires cues, head turns, or add eye shifts to track;Unable to hold eye position out of midline;Impaired - to be further tested in functional context   Convergence: Impaired (comment)     Additional Comments: pt without glasses and  reports "I can't see without them" pt with deficits difficult to fully assess due to missing  glasses   Perception     Praxis      Pertinent Vitals/Pain Pain Assessment: No/denies pain     Hand Dominance Right   Extremity/Trunk Assessment Upper Extremity Assessment Upper Extremity Assessment: Generalized weakness   Lower Extremity Assessment Lower Extremity Assessment: Defer to PT evaluation   Cervical / Trunk Assessment Cervical / Trunk Assessment: Normal   Communication Communication Communication: No difficulties   Cognition Arousal/Alertness: Awake/alert Behavior During Therapy: WFL for tasks assessed/performed Overall Cognitive Status: Impaired/Different from baseline Area of Impairment: Orientation;Attention;Memory;Following commands;Safety/judgement;Awareness;Rancho level Orientation Level: Disoriented to;Time Current Attention Level: Sustained Memory: Decreased recall of precautions;Decreased short-term memory Following Commands: Follows multi-step commands inconsistently Safety/Judgement: Decreased awareness of safety;Decreased awareness of deficits Awareness: Intellectual Problem Solving: Slow processing;Difficulty sequencing General Comments: Pt reports location at [facility] and reason s/p fall and "problem with my heart". Pt unaware of month and with cueing states "Thanksgiving month" pt demonstrates focused attention with visual assessment   General Comments       Exercises       Shoulder Instructions      Home Living Family/patient expects to be discharged to:: Private residence Living Arrangements: Spouse/significant other Available Help at Discharge: Family;Available 24 hours/day Type of Home: House Home Access: Stairs to enter     Home Layout: One level     Bathroom Shower/Tub: Tub/shower unit;Door   [employer] Toilet: Handicapped height     Home Equipment: Kasandra Knudsen - single point      Lives With: Spouse    Prior Functioning/Environment Level of Independence: Independent        Comments: reports multiple falls and states  "they are all running together now" so unable to report accurate amount    OT Diagnosis: Generalized weakness;Cognitive deficits   OT Problem List: Decreased strength;Decreased activity tolerance;Decreased cognition;Decreased safety awareness;Decreased knowledge of use of DME or AE;Impaired vision/perception;Impaired balance (sitting and/or standing)   OT Treatment/Interventions: Self-care/ADL training;Therapeutic exercise;Neuromuscular education;DME and/or AE instruction;Therapeutic activities;Cognitive remediation/compensation;Visual/perceptual remediation/compensation;Patient/family education;Balance training    OT Goals(Current goals can be found in the care plan section) Acute Rehab OT Goals Patient Stated Goal: to be ready for home OT Goal Formulation: With patient Time For Goal Achievement: 10/20/14 Potential to Achieve Goals: Good  OT Frequency: Min 3X/week   Barriers to D/C:            Co-evaluation              End of Session Equipment Utilized During Treatment: Gait belt Nurse Communication: Mobility status;Precautions  Activity Tolerance: Patient tolerated treatment well Patient left: in chair;with call bell/phone within reach;with chair alarm set   Time: [phone] OT Time Calculation (min): 25 min Charges:  OT General Charges $OT Visit: 1 Procedure OT Evaluation $Initial OT Evaluation Tier I: 1 Procedure (cotx) G-Codes:    Peri Maris 16-Oct-2014, 8:27 AM Pager: [phone]

## 2014-10-06 NOTE — Progress Notes (Addendum)
Patient ID: Frank Simpson, male   DOB: 01-Dec-1930, 80 y.o.   MRN: [id number]    Subjective: Still feels a little confused, had some pauses overnight  Objective: Vital signs in last 24 hours: Temp:  [97.8 F (36.6 C)-99.1 F (37.3 C)] 98.2 F (36.8 C) (11/09 0348) Pulse Rate:  [55-70] 57 (11/09 0600) Resp:  [15-43] 22 (11/09 0600) BP: (132-177)/(55-80) 160/70 mmHg (11/09 0600) SpO2:  [92 %-100 %] 97 % (11/09 0600) Last BM Date: 10/05/14  Intake/Output from previous day: 11/08 0701 - 11/09 0700 In: 0762 [P.O.:240; I.V.:1415] Out: 3210 [Urine:3210] Intake/Output this shift:    General appearance: alert and cooperative Resp: clear to auscultation bilaterally Cardio: regular rate and rhythm GI: soft, NT Neuro: PERL, oriented to month and president, not year, F/C  Scalp abrasion  Lab Results: CBC   Recent Labs  10/04/14 0249 10/05/14 0312  WBC 9.7 7.8  HGB 14.6 13.4  HCT 41.4 38.5*  PLT 144* 121*   BMET  Recent Labs  10/04/14 0249 10/05/14 0312  NA 141 141  K 4.1 4.1  CL 107 107  CO2 22 23  GLUCOSE 122* 136*  BUN 21 15  CREATININE 1.56* 1.49*  CALCIUM 8.4 8.2*   PT/INR  Recent Labs  10/03/14 1133  LABPROT 13.9  INR 1.06   ABG  Recent Labs  10/03/14 1343  PHART 7.339*  HCO3 23.2    Studies/Results: No results found.  Anti-infectives: Anti-infectives    Start     Dose/Rate Route Frequency Ordered Stop   10/06/14 0500  gentamicin (GARAMYCIN) 80 mg in sodium chloride irrigation 0.9 % 500 mL irrigation     80 mg Irrigation On call 10/05/14 2042 10/07/14 0500   10/06/14 0500  vancomycin (VANCOCIN) IVPB 1000 mg/200 mL premix     1,000 mg200 mL/hr over 60 Minutes Intravenous On call 10/05/14 2042 10/07/14 0500      Assessment/Plan: Fall Scalp abrasion and hematoma Concussion - exam much improved but still some confusion, TBI team therapies Resp - has done well since extubation FEN - NPO for procedure, lytes OK DM - SSI CV/junctional  bradycardia - pauses seem to be the cause of recent falls, for pacemaker today per cardiology, off [place] - ICU P above, likely eventual CIR   LOS: 3 days    Georganna Skeans, MD, MPH, FACS Trauma: [phone] General Surgery: [phone]  10/06/2014

## 2014-10-06 NOTE — Progress Notes (Signed)
Physical Therapy Treatment Patient Details Name: Frank Simpson MRN: [id number] DOB: 05-Oct-1931 Today's Date: 10/06/2014    History of Present Illness Pt is a 80 y.o. male with PMH of frequent falls, CAD, DM2, ?congestive heart failure, dyslipidemia, MI. He was feeling dizzy and fell while working in the yard with his wife. She sent him to go back to the house and he fell again on the way, striking his head on a tractor. EMS was called and he was unresponsive for them. He came in as a level 1 trauma. He was somewhat responsive here but still had a low GCS and was intubated by the EDP on 11/6, extubated 11/7 in a.m.    PT Comments    Pt today presents as a [place] being confused and inappropriate.  Pt follows simple directions, though has difficulty with attending to task and has difficulty with tasks on L side.  Attempted to have pt find color targets in hallway with pt more successful to colors on R side than L side.  Continue to feel CIR most appropriate D/C plan.  Will continue to follow.    Follow Up Recommendations  CIR     Equipment Recommendations   (TBD)    Recommendations for Other Services       Precautions / Restrictions Precautions Precautions: Fall Precaution Comments: multiple falls at home  Restrictions Weight Bearing Restrictions: No    Mobility  Bed Mobility Overal bed mobility: Needs Assistance Bed Mobility: Supine to Sit Rolling: Min guard   Supine to sit: Min guard     General bed mobility comments: pt able to progress to EOB and needs cues to place bil LE on the floor  Transfers Overall transfer level: Needs assistance Equipment used: 1 person hand held assist Transfers: Sit to/from Stand Sit to Stand: Min assist         General transfer comment: allowed extended time at EOB prior to sit<>Stand. BP monitored and assessed. Pt HR unchanged  Ambulation/Gait Ambulation/Gait assistance: Min assist Ambulation Distance (Feet): 60 Feet Assistive  device: 1 person hand held assist (IV Pole) Gait Pattern/deviations: Step-through pattern;Decreased stride length;Staggering left     General Gait Details: pt tends to drift towards L side and LOB more frequently to L side.  pt with increased balance deficits when presented with cognitive task during mobility.     Stairs            Wheelchair Mobility    Modified Rankin (Stroke Patients Only)       Balance Overall balance assessment: Needs assistance Sitting-balance support: Bilateral upper extremity supported;Feet supported Sitting balance-Leahy Scale: Fair Sitting balance - Comments: pt able to sit without UE support for brief moments.     Standing balance support: Bilateral upper extremity supported;During functional activity Standing balance-Leahy Scale: Poor Standing balance comment: pt with LOB x2 during session.                      Cognition Arousal/Alertness: Awake/alert Behavior During Therapy: WFL for tasks assessed/performed Overall Cognitive Status: Impaired/Different from baseline Area of Impairment: Orientation;Attention;Memory;Following commands;Safety/judgement;Awareness;[place]ancho level Orientation Level: Disoriented to;Time Current Attention Level: Sustained Memory: Decreased recall of precautions;Decreased short-term memory Following Commands: Follows multi-step commands inconsistently Safety/Judgement: Decreased awareness of safety;Decreased awareness of deficits Awareness: Intellectual Problem Solving: Slow processing;Difficulty sequencing General Comments: Pt reports location at [facility] and reason s/p fall and "problem with my heart". Pt unaware of month and with cueing states "Thanksgiving month" pt demonstrates focused attention  with visual assessment    Exercises      General Comments        Pertinent Vitals/Pain Pain Assessment: No/denies pain    Home Living Family/patient expects to be discharged to:: Private residence Living  Arrangements: Spouse/significant other Available Help at Discharge: Family;Available 24 hours/day Type of Home: House Home Access: Stairs to enter   Home Layout: One level Home Equipment: Cane - single point      Prior Function Level of Independence: Independent      Comments: reports multiple falls and states "they are all running together now" so unable to report accurate amount   PT Goals (current goals can now be found in the care plan section) Acute Rehab PT Goals Patient Stated Goal: to be ready for home PT Goal Formulation: With patient Time For Goal Achievement: 10/12/14 Potential to Achieve Goals: Good Progress towards PT goals: Progressing toward goals    Frequency  Min 3X/week    PT Plan Current plan remains appropriate    Co-evaluation PT/OT/SLP Co-Evaluation/Treatment: Yes Reason for Co-Treatment: Complexity of the patient's impairments (multi-system involvement) (TBI Team) PT goals addressed during session: Mobility/safety with mobility;Balance;Strengthening/ROM       End of Session Equipment Utilized During Treatment: Gait belt Activity Tolerance: Patient tolerated treatment well Patient left: in chair;with call bell/phone within reach;with chair alarm set     Time: 7062-3762 PT Time Calculation (min): 24 min  Charges:  $Gait Training: 8-22 mins                    G CodesCatarina Hartshorn, [place] 10/06/2014, 11:00 AM

## 2014-10-06 NOTE — Interval H&P Note (Signed)
History and Physical Interval Note:  10/06/2014 5:08 PM  Frank Simpson  has presented today for surgery, with the diagnosis of bradicardia  The various methods of treatment have been discussed with the patient and family. After consideration of risks, benefits and other options for treatment, the patient has consented to  Procedure(s): PERMANENT PACEMAKER INSERTION (N/A) as a surgical intervention .  The patient's history has been reviewed, patient examined, no change in status, stable for surgery.  I have reviewed the patient's chart and labs.  Questions were answered to the patient's satisfaction.     Cristopher Peru

## 2014-10-06 NOTE — H&P (View-Only) (Signed)
Reason for Consult:syncope  Referring Physician: Dr. Almeta Simpson is an 80 y.o. male.   HPI: The patient is an 80 yo man with a history of coronary artery disease, status post MI, with preserved left ventricular function. For the last 2 years, he has had episodes of near syncope. No obvious etiology. He presented to the hospital after recurrent syncope, and facial fracture. He was found to have a traumatic brain injury. While on a cardiac monitor and while awake, the patient has had recurrent episodes of intermittent complete heart block. Yesterday, he had a 4 second pause, and last night while awake, had an 8 second pause with complete heart block and no escape rhythm. These episodes do not appear to be vagally mediated. He is referred now for consideration of a permanent pacemaker. PMH: Past Medical History  Diagnosis Date  . Diabetes mellitus without complication   . CHF (congestive heart failure)   . Postherpetic neuralgia   . Dyslipidemia   . Ischemic heart disease   . Benign positional vertigo   . Shingles   . Myocardial infarction   . Coronary artery disease   . Arthritis   . Cancer     PSHX: Past Surgical History  Procedure Laterality Date  . Penile prosthesis implant    . Eye surgery    . Joint replacement    . Fracture surgery      foot, pin placement    FAMHX: Family History  Problem Relation Age of Onset  . Stroke Mother   . Diabetes Mother   . Cancer Father   . Diabetes Brother     Social History:  reports that he has never smoked. He has never used smokeless tobacco. He reports that he does not drink alcohol or use illicit drugs.  Allergies:  Allergies  Allergen Reactions  . Januvia [Sitagliptin]   . Metformin And Related   . Oxycodone   . Penicillins     Medications: reviewed, he is on no AV nodal blocking drugs.  No results found.  ROS  As stated in the HPI and negative for all other systems.  Physical Exam  Vitals:Blood  pressure 160/70, pulse 57, temperature 98.2 F (36.8 C), temperature source Oral, resp. rate 22, height 5' 10" (1.778 m), weight 207 lb 0.2 oz (93.9 kg), SpO2 97 %.  Well appearing NAD HEENT: Unremarkable Neck:  No JVD, no thyromegally Lymphatics:  No adenopathy Back:  No CVA tenderness Lungs:  Clear HEART:  Regular rate rhythm, no murmurs, no rubs, no clicks Abd:  Flat, positive bowel sounds, no organomegally, no rebound, no guarding Ext:  2 plus pulses, no edema, no cyanosis, no clubbing Skin:  No rashes no nodules Neuro:  CN II through XII intact, motor grossly intact  Telemetry - normal sinus rhythm with intermittent complete heart block  Assessment/Plan: 1. Symptomatic complete heart block, intermittent 2. Syncope secondary to #1. 3. Traumatic brain injury, currently with no deficits. Discussion: I discussed the treatment options with the patient. The risks, goals, benefits, and expectations of permanent pacemaker insertion have been discussed with the patient, and he wishes to proceed.  Frank Simpson, M.D.  Frank Overlie TaylorMD 10/06/2014, 7:42 AM

## 2014-10-06 NOTE — Consult Note (Signed)
Physical Medicine and Rehabilitation Consult Reason for Consult:TBI Referring Physician: trauma service   HPI: Frank Simpson is a 80 y.o.right handed male with history of diabetes mellitus and peripheral neuropathy, diastolic congestive heart failure and coronary artery disease MI.admitted 10/03/2014 with history of frequent falls.Patient independent with a cane prior to admission living with his wife. He does drive short distances. Most recently he was working in the yard and felt dizzy and fell striking his head on a tractor with loss of consciousness.He was intubated in the ED. Cranial CT scan showed atrophy with small vessel disease no acute intracranial abnormalities. There was a right frontal scalp hematoma. CT of cervical spine as well as abdomen and pelvis unremarkable. Follow-up cardiology services for bouts of bradycardia with hypotension with recurrent episodes of intermittent complete heart block while on the monitor.. Echocardiogram with ejection fraction of 29% grade 1 diastolic dysfunction. Plan was for permanent pacemaker 10/06/2014.hospital course patient was extubated 10/04/2014. Physical and occupational therapy evaluations completed. Recommendations of physical medicine rehabilitation consult.  Review of Systems  Cardiovascular: Positive for palpitations.  Genitourinary: Positive for urgency.  Musculoskeletal: Positive for myalgias and falls.  Neurological: Positive for dizziness.  All other systems reviewed and are negative.  Past Medical History  Diagnosis Date  . Diabetes mellitus without complication   . CHF (congestive heart failure)   . Postherpetic neuralgia   . Dyslipidemia   . Ischemic heart disease   . Benign positional vertigo   . Shingles   . Myocardial infarction   . Coronary artery disease   . Arthritis   . Cancer    Past Surgical History  Procedure Laterality Date  . Penile prosthesis implant    . Eye surgery    . Joint replacement    .  Fracture surgery      foot, pin placement   Family History  Problem Relation Age of Onset  . Stroke Mother   . Diabetes Mother   . Cancer Father   . Diabetes Brother    Social History:  reports that he has never smoked. He has never used smokeless tobacco. He reports that he does not drink alcohol or use illicit drugs. Allergies:  Allergies  Allergen Reactions  . Januvia [Sitagliptin]   . Metformin And Related   . Oxycodone   . Penicillins    Medications Prior to Admission  Medication Sig Dispense Refill  . acetaminophen (TYLENOL) 500 MG tablet Take 500 mg by mouth every 6 (six) hours as needed for mild pain.    [name] Kitchen amLODipine (NORVASC) 5 MG tablet Take 5 mg by mouth daily.    [name] Kitchen aspirin 81 MG tablet Take 81 mg by mouth daily.    . cyanocobalamin (,VITAMIN B-12,) 1000 MCG/ML injection Inject 1,000 mcg into the muscle once.    . insulin lispro (HUMALOG) 100 UNIT/ML injection Inject 2-10 Units into the skin once. SLIDING SCALE: 150-200=2units, 201-250=4units, 251-300=6units, 301-350=8units, >350=10units    . isosorbide mononitrate (IMDUR) 30 MG 24 hr tablet Take 30 mg by mouth daily.    . meclizine (ANTIVERT) 25 MG tablet Take 25 mg by mouth every 6 (six) hours as needed for dizziness.    . nitroGLYCERIN (NITROSTAT) 0.4 MG SL tablet Place 0.4 mg under the tongue every 5 (five) minutes as needed for chest pain.    . rosuvastatin (CRESTOR) 40 MG tablet Take 40 mg by mouth daily.    . sertraline (ZOLOFT) 25 MG tablet Take 25 mg by mouth daily.    [name] Kitchen  tamsulosin (FLOMAX) 0.4 MG CAPS capsule Take 0.4 mg by mouth.    . ticagrelor (BRILINTA) 90 MG TABS tablet Take 90 mg by mouth 2 (two) times daily.    . vitamin B-12 (CYANOCOBALAMIN) 1000 MCG tablet Take 1,000 mcg by mouth every 30 (thirty) days.      Home: Home Living Family/patient expects to be discharged to:: Private residence Living Arrangements: Spouse/significant other Available Help at Discharge: Family, Available 24 hours/day Type  of Home: House Home Access: Stairs to enter [employer] of Steps:  (family present unsure as to how many) Home Layout: One [place]: Radio producer - single point  Lives With: Spouse  Functional History: Prior Function Level of Independence: Independent Comments: reports multiple falls and states "they are all running together now" so unable to report accurate amount Functional Status:  Mobility: Bed Mobility Overal bed mobility: Needs Assistance Bed Mobility: Supine to Sit Rolling: Min guard Supine to sit: Mod assist, HOB elevated Sit to supine: Max assist, +2 for physical assistance General bed mobility comments: pt able to progress to EOB and needs cues to place bil LE on the floor Transfers Overall transfer level: Needs assistance Equipment used: 1 person hand held assist Transfers: Sit to/from Stand Sit to Stand: Min assist General transfer comment: allowed extended time at EOB prior to sit<>Stand. BP monitored and assessed. Pt HR unchanged      ADL: ADL Overall ADL's : Needs assistance/impaired Grooming: Oral care, Wash/dry face, Standing, Minimal assistance Grooming Details (indicate cue type and reason): pt requires extended time to complete oral care and incr time to open tooth paste (fine motor). pt applying tooth paste properly in correct sequence Lower Body Bathing: Maximal assistance, Sit to/from stand Lower Body Bathing Details (indicate cue type and reason): pt with smear on incontinence of bowel and unaware. Peri care performed Functional mobility during ADLs: Minimal assistance (LOB with head turns) General ADL Comments: Pt completed bed mobilty, sink level grooming and brief ambulation. pt asked to scan environment for colors due to glasses missing. pt scanning for objects midline to the R mainly. But with visual assessment pt  unable to sustain horizontal R attention . Pt with lob with head turns.   Cognition: Cognition Overall Cognitive Status:  Impaired/Different from baseline Arousal/Alertness: Awake/alert Orientation Level: Oriented to person, Oriented to place, Oriented to situation, Disoriented to time Attention: Selective Selective Attention: Appears intact Memory: Impaired Memory Impairment: Decreased recall of new information, Decreased short term memory Decreased Short Term Memory: Verbal basic, Functional basic Awareness: Appears intact Problem Solving: Impaired Problem Solving Impairment: Verbal complex Behaviors: Perseveration Safety/Judgment: Impaired [place] [employer] Scales of Cognitive Functioning: Confused/inappropriate/non-agitated Cognition Arousal/Alertness: Awake/alert Behavior During Therapy: WFL for tasks assessed/performed Overall Cognitive Status: Impaired/Different from baseline Area of Impairment: Orientation, Attention, Memory, Following commands, Safety/judgement, Awareness, Rancho level Orientation Level: Disoriented to, Time Current Attention Level: Sustained Memory: Decreased recall of precautions, Decreased short-term memory Following Commands: Follows multi-step commands inconsistently Safety/Judgement: Decreased awareness of safety, Decreased awareness of deficits Awareness: Intellectual Problem Solving: Slow processing, Difficulty sequencing General Comments: Pt reports location at [facility] and reason s/p fall and "problem with my heart". Pt unaware of month and with cueing states "Thanksgiving month" pt demonstrates focused attention with visual assessment  Blood pressure 144/66, pulse 57, temperature 97.9 F (36.6 C), temperature source Axillary, resp. rate 16, height 5' 10" (1.778 m), weight 93.9 kg (207 lb 0.2 oz), SpO2 98 %. Physical Exam  Constitutional: He is oriented to person, place, and  time.  HENT:  Healing abrasion to the right scalp  Eyes: EOM are normal.  Neck: Normal range of motion. Neck supple. No thyromegaly present.  Cardiovascular: Normal rate and regular rhythm.     Respiratory: Effort normal and breath sounds normal. No respiratory distress.  GI: Soft. Bowel sounds are normal. He exhibits no distension.  Neurological: He is alert and oriented to person, place, and time.  Mood is flat but appropriate. He does make good eye contact with examiner. He was able to provide his name, age date of birth. He could not recall full events leading to his hospital admission. Follows commands. Didn't know today's date. Mild right facial droop. [name]. LE: 4/5 proximal to distal. Decreased sensation to PP/LT in both feet  Skin:  Abrasions on right face,scalp. Scattered contusions arms legs.  Psychiatric:  Cooperative. Slightly confused    Results for orders placed or performed during the hospital encounter of 10/03/14 (from the past 24 hour(s))  Glucose, capillary     Status: Abnormal   Collection Time: 10/05/14 11:47 AM  Result Value Ref Range   Glucose-Capillary 249 (H) 70 - 99 mg/dL  Glucose, capillary     Status: Abnormal   Collection Time: 10/05/14  3:36 PM  Result Value Ref Range   Glucose-Capillary 264 (H) 70 - 99 mg/dL  Glucose, capillary     Status: Abnormal   Collection Time: 10/05/14  8:03 PM  Result Value Ref Range   Glucose-Capillary 223 (H) 70 - 99 mg/dL   Comment 1 Notify RN    Comment 2 Documented in Chart   Glucose, capillary     Status: Abnormal   Collection Time: 10/05/14 11:37 PM  Result Value Ref Range   Glucose-Capillary 211 (H) 70 - 99 mg/dL   Comment 1 Documented in Chart    Comment 2 Notify RN   Glucose, capillary     Status: Abnormal   Collection Time: 10/06/14  3:47 AM  Result Value Ref Range   Glucose-Capillary 169 (H) 70 - 99 mg/dL   Comment 1 Documented in Chart    Comment 2 Notify RN   Glucose, capillary     Status: Abnormal   Collection Time: 10/06/14  8:39 AM  Result Value Ref Range   Glucose-Capillary 183 (H) 70 - 99 mg/dL   Comment 1 Notify RN    Comment 2 Documented in Chart    No results  found.  Assessment/Plan: Diagnosis: traumatic brain injury after fall. Hx of falss/gait disorder 1. Does the need for close, 24 hr/day medical supervision in concert with the patient's rehab needs make it unreasonable for this patient to be served in a less intensive setting? Yes 2. Co-Morbidities requiring supervision/potential complications: junctional bradycardia 3. Due to bladder management, bowel management, safety, skin/wound care, disease management, medication administration, pain management and patient education, does the patient require 24 hr/day rehab nursing? Yes 4. Does the patient require coordinated care of a physician, rehab nurse, PT (1-2 hrs/day, 5 days/week), OT (1-2 hrs/day, 5 days/week) and SLP (1-2 hrs/day, 5 days/week) to address physical and functional deficits in the context of the above medical diagnosis(es)? Yes Addressing deficits in the following areas: balance, endurance, locomotion, strength, transferring, bowel/bladder control, bathing, dressing, feeding, grooming, toileting, cognition, language, swallowing and psychosocial support 5. Can the patient actively participate in an intensive therapy program of at least 3 hrs of therapy per day at least 5 days per week? Yes 6. The potential for patient to make measurable gains while  on inpatient rehab is excellent 7. Anticipated functional outcomes upon discharge from inpatient rehab are modified independent and supervision  with PT, modified independent and supervision with OT, modified independent and supervision with SLP. 8. Estimated rehab length of stay to reach the above functional goals is: 7-10 days 9. Does the patient have adequate social supports to accommodate these discharge functional goals? Yes 10. Anticipated D/C setting: Home 11. Anticipated post D/C treatments: [facility] therapy 12. Overall Rehab/Functional Prognosis: excellent  RECOMMENDATIONS: This patient's condition is appropriate for continued  rehabilitative care in the following setting: CIR Patient has agreed to participate in recommended program. Yes Note that insurance prior authorization may be required for reimbursement for recommended care.  Comment: This patient will need to make some long term decision and changes pertaining to safety and balance around home. He's fallen numerous times it appears.   Meredith Staggers, MD, [facility] 10/06/2014     10/06/2014

## 2014-10-06 NOTE — Consult Note (Signed)
Reason for Consult:syncope  Referring Physician: Dr. [name] is an 80 y.o. male.   HPI: The patient is an 80 yo man with a history of coronary artery disease, status post MI, with preserved left ventricular function. For the last 2 years, he has had episodes of near syncope. No obvious etiology. He presented to the hospital after recurrent syncope, and facial fracture. He was found to have a traumatic brain injury. While on a cardiac monitor and while awake, the patient has had recurrent episodes of intermittent complete heart block. Yesterday, he had a 4 second pause, and last night while awake, had an 8 second pause with complete heart block and no escape rhythm. These episodes do not appear to be vagally mediated. He is referred now for consideration of a permanent pacemaker. PMH: Past Medical History  Diagnosis Date  . Diabetes mellitus without complication   . CHF (congestive heart failure)   . Postherpetic neuralgia   . Dyslipidemia   . Ischemic heart disease   . Benign positional vertigo   . Shingles   . Myocardial infarction   . Coronary artery disease   . Arthritis   . Cancer     PSHX: Past Surgical History  Procedure Laterality Date  . Penile prosthesis implant    . Eye surgery    . Joint replacement    . Fracture surgery      foot, pin placement    FAMHX: Family History  Problem Relation Age of Onset  . Stroke Mother   . Diabetes Mother   . Cancer Father   . Diabetes Brother     Social History:  reports that he has never smoked. He has never used smokeless tobacco. He reports that he does not drink alcohol or use illicit drugs.  Allergies:  Allergies  Allergen Reactions  . Januvia [Sitagliptin]   . Metformin And Related   . Oxycodone   . Penicillins     Medications: reviewed, he is on no AV nodal blocking drugs.  No results found.  ROS  As stated in the HPI and negative for all other systems.  Physical Exam  Vitals:Blood  pressure 160/70, pulse 57, temperature 98.2 F (36.8 C), temperature source Oral, resp. rate 22, height 5' 10" (1.778 m), weight 207 lb 0.2 oz (93.9 kg), SpO2 97 %.  Well appearing NAD HEENT: Unremarkable Neck:  No JVD, no thyromegally Lymphatics:  No adenopathy Back:  No CVA tenderness Lungs:  Clear HEART:  Regular rate rhythm, no murmurs, no rubs, no clicks Abd:  Flat, positive bowel sounds, no organomegally, no rebound, no guarding Ext:  2 plus pulses, no edema, no cyanosis, no clubbing Skin:  No rashes no nodules Neuro:  CN II through XII intact, motor grossly intact  Telemetry - normal sinus rhythm with intermittent complete heart block  Assessment/Plan: 1. Symptomatic complete heart block, intermittent 2. Syncope secondary to #1. 3. Traumatic brain injury, currently with no deficits. Discussion: I discussed the treatment options with the patient. The risks, goals, benefits, and expectations of permanent pacemaker insertion have been discussed with the patient, and he wishes to proceed.  Cristopher Peru, M.D.  Carleene Overlie TaylorMD 10/06/2014, 7:42 AM

## 2014-10-07 ENCOUNTER — Inpatient Hospital Stay (HOSPITAL_COMMUNITY)
Admission: RE | Admit: 2014-10-07 | Discharge: 2014-10-17 | DRG: 949 | Disposition: A | Discharge status: Home / Self Care | Payer: Medicare Other | Source: Intra-hospital | Attending: Physical Medicine & Rehabilitation | Admitting: Physical Medicine & Rehabilitation

## 2014-10-07 ENCOUNTER — Encounter: Payer: Self-pay | Admitting: Cardiology

## 2014-10-07 ENCOUNTER — Inpatient Hospital Stay (HOSPITAL_COMMUNITY)
Admission: RE | Admit: 2014-10-07 | Payer: Medicare Other | Source: Intra-hospital | Admitting: Physical Medicine & Rehabilitation

## 2014-10-07 DIAGNOSIS — E1122 Type 2 diabetes mellitus with diabetic chronic kidney disease: Secondary | ICD-10-CM | POA: Diagnosis present

## 2014-10-07 DIAGNOSIS — E785 Hyperlipidemia, unspecified: Secondary | ICD-10-CM | POA: Diagnosis present

## 2014-10-07 DIAGNOSIS — I10 Essential (primary) hypertension: Secondary | ICD-10-CM | POA: Diagnosis present

## 2014-10-07 DIAGNOSIS — W19XXXA Unspecified fall, initial encounter: Secondary | ICD-10-CM | POA: Diagnosis present

## 2014-10-07 DIAGNOSIS — N183 Chronic kidney disease, stage 3 (moderate): Secondary | ICD-10-CM | POA: Diagnosis not present

## 2014-10-07 DIAGNOSIS — W19XXXS Unspecified fall, sequela: Secondary | ICD-10-CM

## 2014-10-07 DIAGNOSIS — R2689 Other abnormalities of gait and mobility: Secondary | ICD-10-CM | POA: Diagnosis present

## 2014-10-07 DIAGNOSIS — F22 Delusional disorders: Secondary | ICD-10-CM | POA: Diagnosis present

## 2014-10-07 DIAGNOSIS — E1165 Type 2 diabetes mellitus with hyperglycemia: Secondary | ICD-10-CM | POA: Diagnosis not present

## 2014-10-07 DIAGNOSIS — E1140 Type 2 diabetes mellitus with diabetic neuropathy, unspecified: Secondary | ICD-10-CM | POA: Diagnosis not present

## 2014-10-07 DIAGNOSIS — N400 Enlarged prostate without lower urinary tract symptoms: Secondary | ICD-10-CM | POA: Diagnosis present

## 2014-10-07 DIAGNOSIS — R269 Unspecified abnormalities of gait and mobility: Secondary | ICD-10-CM

## 2014-10-07 DIAGNOSIS — I5030 Unspecified diastolic (congestive) heart failure: Secondary | ICD-10-CM | POA: Diagnosis present

## 2014-10-07 DIAGNOSIS — S069X2D Unspecified intracranial injury with loss of consciousness of 31 minutes to 59 minutes, subsequent encounter: Secondary | ICD-10-CM | POA: Diagnosis not present

## 2014-10-07 DIAGNOSIS — I252 Old myocardial infarction: Secondary | ICD-10-CM

## 2014-10-07 DIAGNOSIS — F0390 Unspecified dementia without behavioral disturbance: Secondary | ICD-10-CM | POA: Diagnosis present

## 2014-10-07 DIAGNOSIS — Z9181 History of falling: Secondary | ICD-10-CM | POA: Diagnosis not present

## 2014-10-07 DIAGNOSIS — S069X2S Unspecified intracranial injury with loss of consciousness of 31 minutes to 59 minutes, sequela: Secondary | ICD-10-CM | POA: Diagnosis not present

## 2014-10-07 DIAGNOSIS — F329 Major depressive disorder, single episode, unspecified: Secondary | ICD-10-CM | POA: Diagnosis present

## 2014-10-07 DIAGNOSIS — E1142 Type 2 diabetes mellitus with diabetic polyneuropathy: Secondary | ICD-10-CM | POA: Diagnosis present

## 2014-10-07 DIAGNOSIS — Z794 Long term (current) use of insulin: Secondary | ICD-10-CM

## 2014-10-07 DIAGNOSIS — I2510 Atherosclerotic heart disease of native coronary artery without angina pectoris: Secondary | ICD-10-CM | POA: Diagnosis present

## 2014-10-07 DIAGNOSIS — R001 Bradycardia, unspecified: Secondary | ICD-10-CM | POA: Diagnosis present

## 2014-10-07 DIAGNOSIS — S06892D Other specified intracranial injury with loss of consciousness of 31 minutes to 59 minutes, subsequent encounter: Principal | ICD-10-CM

## 2014-10-07 DIAGNOSIS — Z950 Presence of cardiac pacemaker: Secondary | ICD-10-CM

## 2014-10-07 DIAGNOSIS — W1830XD Fall on same level, unspecified, subsequent encounter: Secondary | ICD-10-CM | POA: Diagnosis present

## 2014-10-07 DIAGNOSIS — S069X2A Unspecified intracranial injury with loss of consciousness of 31 minutes to 59 minutes, initial encounter: Secondary | ICD-10-CM | POA: Diagnosis present

## 2014-10-07 LAB — GLUCOSE, CAPILLARY
GLUCOSE-CAPILLARY: 170 mg/dL — AB (ref 70–99)
GLUCOSE-CAPILLARY: 171 mg/dL — AB (ref 70–99)
GLUCOSE-CAPILLARY: 208 mg/dL — AB (ref 70–99)
GLUCOSE-CAPILLARY: 218 mg/dL — AB (ref 70–99)
GLUCOSE-CAPILLARY: 280 mg/dL — AB (ref 70–99)
Glucose-Capillary: 181 mg/dL — ABNORMAL HIGH (ref 70–99)
Glucose-Capillary: 178 mg/dL — ABNORMAL HIGH (ref 70–99)

## 2014-10-07 MED ORDER — SORBITOL 70 % SOLN
30.0000 mL | Freq: Every day | Status: DC | PRN
  Administered 2014-10-10 – 2014-10-13 (×3): 30 mL via ORAL
  Filled 2014-10-07 (×3): qty 30

## 2014-10-07 MED ORDER — ONDANSETRON HCL 4 MG/2ML IJ SOLN: 4.0000 mg | Freq: Four times a day (QID) | INTRAMUSCULAR | Status: DC | PRN

## 2014-10-07 MED ORDER — TRAZODONE HCL 50 MG PO TABS
50.0000 mg | ORAL_TABLET | Freq: Every evening | ORAL | Status: DC | PRN
  Administered 2014-10-12: 50 mg via ORAL
  Filled 2014-10-07: qty 1

## 2014-10-07 MED ORDER — INSULIN ASPART 100 UNIT/ML ~~LOC~~ SOLN
0.0000 [IU] | SUBCUTANEOUS | Status: DC
  Administered 2014-10-07: 3 [IU] via SUBCUTANEOUS
  Administered 2014-10-07: 11 [IU] via SUBCUTANEOUS
  Administered 2014-10-08 (×3): 3 [IU] via SUBCUTANEOUS

## 2014-10-07 MED ORDER — CETYLPYRIDINIUM CHLORIDE 0.05 % MT LIQD
7.0000 mL | Freq: Two times a day (BID) | OROMUCOSAL | Status: DC
  Administered 2014-10-07 – 2014-10-17 (×17): 7 mL via OROMUCOSAL

## 2014-10-07 MED ORDER — BACITRACIN-NEOMYCIN-POLYMYXIN OINTMENT TUBE
TOPICAL_OINTMENT | Freq: Two times a day (BID) | CUTANEOUS | Status: DC
  Administered 2014-10-07 – 2014-10-11 (×7): via TOPICAL
  Administered 2014-10-11 – 2014-10-12 (×2): 1 via TOPICAL
  Administered 2014-10-12: via TOPICAL
  Administered 2014-10-13: 1 via TOPICAL
  Administered 2014-10-13 – 2014-10-16 (×6): via TOPICAL
  Administered 2014-10-16: 1 via TOPICAL
  Administered 2014-10-17: 09:00:00 via TOPICAL
  Filled 2014-10-07 (×2): qty 15

## 2014-10-07 MED ORDER — ACETAMINOPHEN 325 MG PO TABS
325.0000 mg | ORAL_TABLET | ORAL | Status: DC | PRN
  Administered 2014-10-10 (×2): 650 mg via ORAL
  Filled 2014-10-07 (×2): qty 2

## 2014-10-07 MED ORDER — ROSUVASTATIN CALCIUM 40 MG PO TABS
40.0000 mg | ORAL_TABLET | Freq: Every day | ORAL | Status: DC
  Administered 2014-10-08 – 2014-10-16 (×9): 40 mg via ORAL
  Filled 2014-10-07 (×11): qty 1

## 2014-10-07 MED ORDER — TAMSULOSIN HCL 0.4 MG PO CAPS
0.4000 mg | ORAL_CAPSULE | Freq: Every day | ORAL | Status: DC
  Administered 2014-10-08 – 2014-10-16 (×9): 0.4 mg via ORAL
  Filled 2014-10-07 (×11): qty 1

## 2014-10-07 MED ORDER — SERTRALINE HCL 25 MG PO TABS
25.0000 mg | ORAL_TABLET | Freq: Every day | ORAL | Status: DC
  Administered 2014-10-08 – 2014-10-17 (×10): 25 mg via ORAL
  Filled 2014-10-07 (×12): qty 1

## 2014-10-07 MED ORDER — ISOSORBIDE MONONITRATE ER 30 MG PO TB24
30.0000 mg | ORAL_TABLET | Freq: Every day | ORAL | Status: DC
  Administered 2014-10-08 – 2014-10-17 (×10): 30 mg via ORAL
  Filled 2014-10-07 (×12): qty 1

## 2014-10-07 MED ORDER — HYDROCODONE-ACETAMINOPHEN 5-325 MG PO TABS
1.0000 | ORAL_TABLET | ORAL | Status: DC | PRN
  Administered 2014-10-14: 2 via ORAL
  Filled 2014-10-07: qty 2

## 2014-10-07 MED ORDER — AMLODIPINE BESYLATE 5 MG PO TABS
5.0000 mg | ORAL_TABLET | Freq: Every day | ORAL | Status: DC
  Administered 2014-10-08 – 2014-10-17 (×10): 5 mg via ORAL
  Filled 2014-10-07 (×11): qty 1

## 2014-10-07 MED ORDER — ONDANSETRON HCL 4 MG PO TABS: 4.0000 mg | ORAL_TABLET | Freq: Four times a day (QID) | ORAL | Status: DC | PRN

## 2014-10-07 MED ORDER — ASPIRIN 81 MG PO CHEW
81.0000 mg | CHEWABLE_TABLET | Freq: Every day | ORAL | Status: DC
  Administered 2014-10-08 – 2014-10-17 (×10): 81 mg via ORAL
  Filled 2014-10-07 (×10): qty 1

## 2014-10-07 MED ORDER — NITROGLYCERIN 0.4 MG SL SUBL: 0.4000 mg | SUBLINGUAL_TABLET | SUBLINGUAL | Status: DC | PRN

## 2014-10-07 NOTE — PMR Pre-admission (Signed)
PMR Admission Coordinator Pre-Admission Assessment  Patient: Frank Simpson is an 80 y.o., male MRN: [id number] DOB: 1931-09-16 Height: 5' 10" (177.8 cm) Weight: 93.9 kg (207 lb 0.2 oz)              Insurance Information   PRIMARY: Medicare A & B      Policy#: [id number]      Subscriber: self Pre-Cert#: verified in [employer]: retired Runner, broadcasting/film/video. Date: A & B: 11-28-97     Deduct: $1260      Out of Pocket Max: none       Life Max: unlimited CIR: 100%      SNF: 100% days 1-20; 80% days 21-100 (100 day visit max) Outpatient: 80%     Co-Pay: 20% Home Health: 100%      Co-Pay: none DME: 80%     Co-Pay: 20% Providers: pt's preference  SECONDARY: [employer]      Policy#: [id number]      Subscriber: self Benefits:  Phone #: [phone]       Emergency Contact Information Contact Information    Name Relation Home Work [place] Spouse [phone]  [phone]   Gotham, Raden [phone]  [phone]     Current Medical History  Patient Admitting Diagnosis: traumatic brain injury after fall. Hx of falls/gait disorder  History of Present Illness:  Frank Simpson is a 80 y.o.right handed male with history of diabetes mellitus and peripheral neuropathy, diastolic congestive heart failure and coronary artery disease MI.admitted 10/03/2014 with history of frequent falls.Patient independent with a cane prior to admission living with his wife. He does drive short distances. Most recently he was working in the yard and felt dizzy and fell striking his head on a tractor with loss of consciousness.He was intubated in the ED. Cranial CT scan showed atrophy with small vessel disease no acute intracranial abnormalities. There was a right frontal scalp hematoma. CT of cervical spine as well as abdomen and pelvis unremarkable. Follow-up cardiology services for bouts of bradycardia with hypotension with recurrent episodes of intermittent complete heart block while on the monitor..  Echocardiogram with ejection fraction of 49% grade 1 diastolic dysfunction. Plan was for permanent pacemaker 10/06/2014.hospital course patient was extubated 10/04/2014. Physical and occupational therapy evaluations completed. Recommendations of physical medicine rehabilitation consult.  Past Medical History  Past Medical History  Diagnosis Date  . Diabetes mellitus without complication   . CHF (congestive heart failure)   . Postherpetic neuralgia   . Dyslipidemia   . Ischemic heart disease   . Benign positional vertigo   . Shingles   . Myocardial infarction   . Coronary artery disease   . Arthritis   . Cancer     Family History  family history includes Cancer in his father; Diabetes in his brother and mother; Stroke in his mother.  Prior Rehab/Hospitalizations: pt had previous R TKR and follow up home health/outpt PT.   Current Medications  Current facility-administered medications: 0.9 %  sodium chloride infusion, , Intravenous, Continuous, Georganna Skeans, MD;  0.9 % NaCl with KCl 20 mEq/ L  infusion, , Intravenous, Continuous, Coralie Keens, MD, Last Rate: 50 mL/hr at 10/06/14 1837;  acetaminophen (TYLENOL) tablet 325-650 mg, 325-650 mg, Oral, Q4H PRN, Evans Lance, MD;  acetaminophen (TYLENOL) tablet 500 mg, 500 mg, Oral, Q6H PRN, Evans Lance, MD amLODipine (NORVASC) tablet 5 mg, 5 mg, Oral, Daily, Evans Lance, MD, 5 mg at 10/07/14 1120;  antiseptic oral rinse (  CPC / CETYLPYRIDINIUM CHLORIDE 0.05%) solution 7 mL, 7 mL, Mouth Rinse, BID, Coralie Keens, MD, 7 mL at 10/07/14 1120;  cyanocobalamin ((VITAMIN B-12)) injection 1,000 mcg, 1,000 mcg, Intramuscular, Once, Evans Lance, MD, 1,000 mcg at 10/07/14 1121 fentaNYL (SUBLIMAZE) 2,500 mcg in sodium chloride 0.9 % 250 mL (10 mcg/mL) infusion, 20-50 mcg/hr, Intravenous, Continuous, Joanell Rising, MD, Stopped at 10/03/14 1710;  fentaNYL (SUBLIMAZE) injection 50 mcg, 50 mcg, Intravenous, Q2H PRN, Lisette Abu, PA-C;   haloperidol lactate (HALDOL) injection 4 mg, 4 mg, Intravenous, Q6H PRN, Georganna Skeans, MD, 4 mg at 10/07/14 0532 HYDROcodone-acetaminophen (NORCO/VICODIN) 5-325 MG per tablet 1-2 tablet, 1-2 tablet, Oral, Q4H PRN, Georganna Skeans, MD;  insulin aspart (novoLOG) injection 0-15 Units, 0-15 Units, Subcutaneous, 6 times per day, Lisette Abu, PA-C, 3 Units at 10/07/14 [phone];  LORazepam (ATIVAN) injection 1 mg, 1 mg, Intravenous, Q4H PRN, Georganna Skeans, MD, 1 mg at 10/07/14 0532 magic mouthwash w/lidocaine, 10 mL, Oral, TID, Georganna Skeans, MD, 10 mL at 10/07/14 1122;  morphine 2 MG/ML injection 2 mg, 2 mg, Intravenous, Q1H PRN, Georganna Skeans, MD;  neomycin-bacitracin-polymyxin (NEOSPORIN) ointment, , Topical, BID, Georganna Skeans, MD;  ondansetron [facility]) tablet 4 mg, 4 mg, Oral, Q6H PRN **OR** ondansetron (ZOFRAN) injection 4 mg, 4 mg, Intravenous, Q6H PRN, Lisette Abu, PA-C ondansetron [facility]) injection 4 mg, 4 mg, Intravenous, Q6H PRN, Evans Lance, MD;  pantoprazole (PROTONIX) EC tablet 40 mg, 40 mg, Oral, Daily, 40 mg at 10/07/14 1123 **OR** pantoprazole (PROTONIX) injection 40 mg, 40 mg, Intravenous, Daily, Lisette Abu, PA-C, 40 mg at 10/06/14 8469  Patients Current Diet: Diet Carb Modified (meds- whole with liquid, with puree if difficulties, cue for small bites/sips, alternate food/liquid, sit upright 30 minutes after meal)  Precautions / Restrictions Precautions Precautions: Fall Precaution Comments: multiple falls at home  Restrictions Weight Bearing Restrictions: No   Prior Activity Level Community (5-7x/wk): Pt got out everyday and was driving. 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 "I should be using my cane but I didn't".   Home Assistive Devices / Equipment Home Assistive Devices/Equipment: CBG Meter, Eyeglasses, Radio producer (specify quad or straight), Shower chair with  back Home Equipment: Cane - single point  Prior Functional Level Prior Function Level of Independence: Independent Comments: reports multiple falls and states "they are all running together now" so unable to report accurate amount  Current Functional Level Cognition  Arousal/Alertness: Awake/alert Overall Cognitive Status: Impaired/Different from baseline Current Attention Level: Focused Orientation Level: Oriented to person Following Commands: Follows one step commands with increased time, Follows multi-step commands inconsistently Safety/Judgement: Decreased awareness of safety, Decreased awareness of deficits General Comments: pt with increased lethargy today.  pt thinks he is at home, but does recall being put in restraints last night.  pt with poor recall of orientation when asked 33mns later orientation questions.   Attention: Selective Selective Attention: Appears intact Memory: Impaired Memory Impairment: Decreased recall of new information, Decreased short term memory Decreased Short Term Memory: Verbal basic, Functional basic Awareness: Appears intact Problem Solving: Impaired Problem Solving Impairment: Verbal complex Behaviors: Perseveration Safety/Judgment: Impaired Rancho LDuke EnergyScales of Cognitive Functioning: Confused/inappropriate/non-agitated    Extremity Assessment (includes Sensation/Coordination)          ADLs  Overall ADL's : Needs assistance/impaired Grooming: Oral care, Wash/dry face, Standing, Minimal assistance Grooming Details (indicate cue type and reason): pt requires  extended time to complete oral care and incr time to open tooth paste (fine motor). pt applying tooth paste properly in correct sequence Lower Body Bathing: Maximal assistance, Sit to/from stand Lower Body Bathing Details (indicate cue type and reason): pt with smear on incontinence of bowel and unaware. Peri care performed Functional mobility during ADLs: Minimal assistance (LOB  with head turns) General ADL Comments: Pt completed bed mobilty, sink level grooming and brief ambulation. pt asked to scan environment for colors due to glasses missing. pt scanning for objects midline to the R mainly. But with visual assessment pt  unable to sustain horizontal R attention . Pt with lob with head turns.     Mobility  Overal bed mobility: Needs Assistance Bed Mobility: Supine to Sit Rolling: Min guard Supine to sit: Mod assist Sit to supine: Max assist, +2 for physical assistance General bed mobility comments: pt requiring A to come to sitting today.  Needs cueing for eyes open and sequencing.      Transfers  Overall transfer level: Needs assistance Equipment used: 2 person hand held assist Transfers: Sit to/from Stand Sit to Stand: Min assist, +2 physical assistance General transfer comment: pt needs increased balance A today and leans mroe heavily to L side.      Ambulation / Gait / Stairs / Wheelchair Mobility  Ambulation/Gait Ambulation/Gait assistance: Min assist, +2 physical assistance Ambulation Distance (Feet): 60 Feet Assistive device: 2 person hand held assist Gait Pattern/deviations: Step-through pattern, Decreased stride length, Narrow base of support, Staggering left General Gait Details: pt needing 2nd person A today 2/2 decreased attention and increased L lateral lean.  pt continues to need cueing for objects on L side.      Posture / Balance Dynamic Sitting Balance Sitting balance - Comments: pt needs R UE support for balance today.      Special needs/care consideration BiPAP/CPAP no  CPM no  Continuous Drip IV no  Dialysis no         Life Vest no  Oxygen no Special Bed no  Trach Size no  Wound Vac (area) no        Skin - healing R frontal scalp hematoma, R UE foreman IV site, abrasions R face and scattered contusions arms/legs                               Bowel mgmt: last BM on 10-06-14, incontinent Bladder mgmt: incontinet Diabetic mgmt -  managed at home with insulin  Note: pt wears his glasses all the time. Wife had the glasses adjusted after his fall.  As of 08-07-14, pt is currently displaying behaviors consistent with Rancho V.   Previous Home Environment Living Arrangements: Spouse/significant other  Lives With: Spouse Available Help at Discharge: Family, Available 24 hours/day Type of Home: House Home Layout: One level Home Access: Stairs to enter Technical brewer of Steps: 3  Bathroom Shower/Tub: Public librarian, Charity fundraiser: Handicapped height Home Care Services: No  Discharge Living Setting Plans for Discharge Living Setting: Patient's home Type of Home at Discharge: House Discharge Home Layout: One level Discharge Home Access: Stairs to enter Entrance Stairs-Rails: Right, Left Entrance Stairs-Number of Steps: 3 Discharge Bathroom Shower/Tub: Tub/shower unit Discharge Bathroom Toilet: Handicapped height Does the patient have any problems obtaining your medications?: No  Social/Family/Support Systems Patient Roles: Spouse, Other (Comment) (active on his farm as well) Contact Information: wife Pamala Hurry is primary contact Anticipated Caregiver: wife Anticipated  Caregiver's Contact Information: see above Ability/Limitations of Caregiver: no limitations Caregiver Availability: 24/7 Discharge Plan Discussed with Primary Caregiver: Yes Is Caregiver In Agreement with Plan?: Yes Does Caregiver/Family have Issues with Lodging/Transportation while Pt is in Rehab?: No  Goals/Additional Needs Patient/Family Goal for Rehab: Mod Ind and Supervision with PT/OT/SLP Expected length of stay: 7-10 days Cultural Considerations: none Dietary Needs: carb modified Equipment Needs: to be determined Pt/Family Agrees to Admission and willing to participate: Yes (spoke with pt and his wife on 11-9 and 11-10) Program Orientation Provided & Reviewed with Pt/Caregiver Including Roles  & Responsibilities:  Yes   Decrease burden of Care through IP rehab admission: NA   Possible need for SNF placement upon discharge: not anticipated   Patient Condition: This patient's condition remains as documented in the consult dated 10-06-14, in which the Rehabilitation Physician determined and documented that the patient's condition is appropriate for intensive rehabilitative care in an inpatient rehabilitation facility. Will admit to inpatient rehab today.  Preadmission Screen Completed By: Nanetta Batty, PT, 10/07/2014 11:37 AM ______________________________________________________________________   Discussed status with Dr. Naaman Plummer on 10-06-14 at 1137 and received telephone approval for admission today.  Admission Coordinator:  Nanetta Batty, PT, time 1137/Date 10-06-14

## 2014-10-07 NOTE — Interval H&P Note (Signed)
Frank Simpson was admitted today to Inpatient Rehabilitation with the diagnosis of traumatic brain injury.  The patient's history has been reviewed, patient examined, and there is no change in status.  Patient continues to be appropriate for intensive inpatient rehabilitation.  I have reviewed the patient's chart and labs.  Questions were answered to the patient's satisfaction.  SWARTZ,ZACHARY T 10/07/2014, 5:13 PM

## 2014-10-07 NOTE — Discharge Summary (Signed)
[facility] Discharge Summary   Patient ID: Frank Simpson MRN: [id number] DOB/AGE: 12/06/1930 80 y.o.  Admit date: 10/03/2014 Discharge date: 10/07/2014  Discharge Diagnoses Patient Active Problem List   Diagnosis Date Noted  . Fall   . TBI (traumatic brain injury) 10/03/2014  . Syncope 10/03/2014  . Junctional bradycardia 10/03/2014    Consultants Dr. Debara Pickett, Dr. Lovena Le (Cardiology) Dr. Dava Najjar (Rehab)  Procedures Pacemaker implantation - Dr. Lovena Le (10/06/14)  Hospital Course:  80 y.o.right handed male with history of DM with peripheral neuropathy, diastolic CHF and CAD // MI maintained on Brilinta and a baby aspirin prior to admission.  He was brought to [facility] and subsequently admitted 10/03/2014 with history of frequent falls.  Patient independent with a cane prior to admission living with his wife.  He does drive short distances.  Most recently he was working in the yard and felt dizzy and fell striking his head on a tractor with loss of consciousness.  He was intubated in the ED. Cranial CT scan showed atrophy with small vessel disease no acute intracranial abnormalities. There was a right frontal scalp hematoma.  He showed evidence of a concussion as shown by his intermittent confusion and somulence.  CT of cervical spine as well as abdomen and pelvis unremarkable.   Follow-up cardiology services for bouts of bradycardia with hypotension with recurrent episodes of intermittent complete heart block while on the monitor.  Underwent placement of pacemaker 10/06/2014 per Dr. Osie Cheeks.  Echocardiogram with ejection fraction of 38% grade 1 diastolic dysfunction.  Patient remains off Brilinta per cardiology services.  Patient was extubated 10/04/2014.  Physical and occupational therapy evaluations were done and they recommended CIR.  The patient was to be transferred to the SDU today, but CIR has a bed available and he was deemed fit for transfer to  inpatient rehab.  Diet was advanced as tolerated to carb modified diet.  On HD #4, the patient was voiding well, tolerating diet, ambulating well, pain well controlled, vital signs stable, incisions c/d/i and felt stable for discharge home.  Patient will follow up in our office as needed and knows to call with questions or concerns.       Medication List    STOP taking these medications        ticagrelor 90 MG Tabs tablet  Commonly known as:  BRILINTA      TAKE these medications        acetaminophen 500 MG tablet  Commonly known as:  TYLENOL  Take 500 mg by mouth every 6 (six) hours as needed for mild pain.     amLODipine 5 MG tablet  Commonly known as:  NORVASC  Take 5 mg by mouth daily.     aspirin 81 MG tablet  Take 81 mg by mouth daily.     insulin lispro 100 UNIT/ML injection  Commonly known as:  HUMALOG  Inject 2-10 Units into the skin once. SLIDING SCALE: 150-200=2units, 201-250=4units, 251-300=6units, 301-350=8units, >350=10units     isosorbide mononitrate 30 MG 24 hr tablet  Commonly known as:  IMDUR  Take 30 mg by mouth daily.     meclizine 25 MG tablet  Commonly known as:  ANTIVERT  Take 25 mg by mouth every 6 (six) hours as needed for dizziness.     nitroGLYCERIN 0.4 MG SL tablet  Commonly known as:  NITROSTAT  Place 0.4 mg under the tongue every 5 (five) minutes as needed for chest pain.     rosuvastatin  40 MG tablet  Commonly known as:  CRESTOR  Take 40 mg by mouth daily.     sertraline 25 MG tablet  Commonly known as:  ZOLOFT  Take 25 mg by mouth daily.     tamsulosin 0.4 MG Caps capsule  Commonly known as:  FLOMAX  Take 0.4 mg by mouth.     vitamin B-12 1000 MCG tablet  Commonly known as:  CYANOCOBALAMIN  Take 1,000 mcg by mouth every 30 (thirty) days.         Follow-up Information    Follow up with Pixie Casino, MD.   Specialty:  Cardiology   Why:  For post-hospital follow up regarding your recent hospitalization   Contact  information:   [place] [address] [phone]       Follow up with Cristopher Peru, MD.   Specialty:  Cardiology   Why:  For post-hospital follow up regarding your recent hospitalization   Contact information:   [address] [place] [address] [phone]       Follow up with [facility].   Why:  As needed   Contact information:   [place] [address] [phone]       Follow up with [name], MD.   Specialty:  Family Medicine   Why:  For post-hospital follow up regarding your recent hospitalization   Contact information:   [place] [address] [phone]       Signed: Nehemiah Massed. [place], [facility]  Trauma Service [phone]  10/07/2014, 12:14 PM

## 2014-10-07 NOTE — Progress Notes (Signed)
Physical Therapy Treatment Patient Details Name: Frank Simpson MRN: [id number] DOB: 06-13-1931 Today's Date: 10/07/2014    History of Present Illness Pt is a 80 y.o. male with PMH of frequent falls, CAD, DM2, ?congestive heart failure, dyslipidemia, MI. He was feeling dizzy and fell while working in the yard with his wife. She sent him to go back to the house and he fell again on the way, striking his head on a tractor. EMS was called and he was unresponsive for them. He came in as a level 1 trauma. He was somewhat responsive here but still had a low GCS and was intubated by the EDP on 11/6, extubated 11/7 in a.m.    PT Comments    Pt with increased lethargy today, but still able to participate in mobility.  Pt needs increased time for processing and re-orienting to location and situation.  Still feel CIR most appropriate D/C location for pt.  Will continue to follow.    Follow Up Recommendations  CIR     Equipment Recommendations   (TBD)    Recommendations for Other Services       Precautions / Restrictions Precautions Precautions: Fall Precaution Comments: multiple falls at home  Restrictions Weight Bearing Restrictions: No    Mobility  Bed Mobility Overal bed mobility: Needs Assistance Bed Mobility: Supine to Sit     Supine to sit: Mod assist     General bed mobility comments: pt requiring A to come to sitting today.  Needs cueing for eyes open and sequencing.    Transfers Overall transfer level: Needs assistance Equipment used: 2 person hand held assist Transfers: Sit to/from Stand Sit to Stand: Min assist;+2 physical assistance         General transfer comment: pt needs increased balance A today and leans mroe heavily to L side.    Ambulation/Gait Ambulation/Gait assistance: Min assist;+2 physical assistance Ambulation Distance (Feet): 60 Feet Assistive device: 2 person hand held assist Gait Pattern/deviations: Step-through pattern;Decreased stride  length;Narrow base of support;Staggering left     General Gait Details: pt needing 2nd person A today 2/2 decreased attention and increased L lateral lean.  pt continues to need cueing for objects on L side.     Stairs            Wheelchair Mobility    Modified Rankin (Stroke Patients Only)       Balance Overall balance assessment: Needs assistance;History of Falls Sitting-balance support: Single extremity supported;Feet supported Sitting balance-Leahy Scale: Poor Sitting balance - Comments: pt needs R UE support for balance today.     Standing balance support: Bilateral upper extremity supported;During functional activity Standing balance-Leahy Scale: Poor                      Cognition Arousal/Alertness: Lethargic Behavior During Therapy: Flat affect Overall Cognitive Status: Impaired/Different from baseline Area of Impairment: Orientation;Attention;Memory;Following commands;Safety/judgement;Awareness;Problem solving Orientation Level: Disoriented to;Place;Time;Situation Current Attention Level: Focused Memory: Decreased recall of precautions;Decreased short-term memory Following Commands: Follows one step commands with increased time;Follows multi-step commands inconsistently Safety/Judgement: Decreased awareness of safety;Decreased awareness of deficits Awareness: Intellectual Problem Solving: Slow processing;Difficulty sequencing;Requires verbal cues;Requires tactile cues General Comments: pt with increased lethargy today.  pt thinks he is at home, but does recall being put in restraints last night.  pt with poor recall of orientation when asked 37mns later orientation questions.      Exercises      General Comments  Pertinent Vitals/Pain Pain Assessment: No/denies pain    Home Living                      Prior Function            PT Goals (current goals can now be found in the care plan section) Acute Rehab PT Goals PT Goal  Formulation: With patient Time For Goal Achievement: 10/12/14 Potential to Achieve Goals: Good Progress towards PT goals: Progressing toward goals    Frequency  Min 3X/week    PT Plan Current plan remains appropriate    Co-evaluation             End of Session Equipment Utilized During Treatment: Gait belt Activity Tolerance: Patient tolerated treatment well Patient left: in chair;with call bell/phone within reach;with chair alarm set;with restraints reapplied     Time: 0803-0829 PT Time Calculation (min) (ACUTE ONLY): 26 min  Charges:  $Gait Training: 23-37 mins                    G CodesCatarina Hartshorn, [place] 10/07/2014, 9:10 AM

## 2014-10-07 NOTE — Progress Notes (Signed)
[facility] Admission Coordinator Signed Physical Medicine and Rehabilitation PMR Pre-admission 10/07/2014 9:45 AM  Related encounter: ED to Hosp-Admission (Discharged) from 10/03/2014 in [place] ICU    Expand All Collapse All   PMR Admission Coordinator Pre-Admission Assessment  Patient: Frank Simpson is an 80 y.o., male MRN: [id number] DOB: 1931/06/09 Height: 5' 10" (177.8 cm) Weight: 93.9 kg (207 lb 0.2 oz)  Insurance Information  PRIMARY: Medicare A & B Policy#: [id number] Subscriber: self Pre-Cert#: verified in [employer]: retired Runner, broadcasting/film/video. Date: A & B: 11-28-97 Deduct: $1260 Out of Pocket Max: none Life Max: unlimited CIR: 100% SNF: 100% days 1-20; 80% days 21-100 (100 day visit max) Outpatient: 80% Co-Pay: 20% Home Health: 100% Co-Pay: none DME: 80% Co-Pay: 20% Providers: pt's preference  SECONDARY: [employer] Policy#: [id number] Subscriber: self Benefits: Phone #: [phone]   Emergency Contact Information Contact Information    Name Relation Home Work [place] Spouse [phone]  [phone]   Kacen, Mellinger [phone]  [phone]     Current Medical History  Patient Admitting Diagnosis: traumatic brain injury after fall. Hx of falls/gait disorder  History of Present Illness:  Frank Simpson is a 80 y.o.right handed male with history of diabetes mellitus and peripheral neuropathy, diastolic congestive heart failure and coronary artery disease MI.admitted 10/03/2014 with history of frequent falls.Patient independent with a cane prior to admission living with his wife. He does drive short distances. Most recently he was working in the yard and felt dizzy and fell  striking his head on a tractor with loss of consciousness.He was intubated in the ED. Cranial CT scan showed atrophy with small vessel disease no acute intracranial abnormalities. There was a right frontal scalp hematoma. CT of cervical spine as well as abdomen and pelvis unremarkable. Follow-up cardiology services for bouts of bradycardia with hypotension with recurrent episodes of intermittent complete heart block while on the monitor.. Echocardiogram with ejection fraction of 38% grade 1 diastolic dysfunction. Plan was for permanent pacemaker 10/06/2014.hospital course patient was extubated 10/04/2014. Physical and occupational therapy evaluations completed. Recommendations of physical medicine rehabilitation consult.  Past Medical History  Past Medical History  Diagnosis Date  . Diabetes mellitus without complication   . CHF (congestive heart failure)   . Postherpetic neuralgia   . Dyslipidemia   . Ischemic heart disease   . Benign positional vertigo   . Shingles   . Myocardial infarction   . Coronary artery disease   . Arthritis   . Cancer     Family History  family history includes Cancer in his father; Diabetes in his brother and mother; Stroke in his mother.  Prior Rehab/Hospitalizations: pt had previous R TKR and follow up home health/outpt PT.  Current Medications  Current facility-administered medications: 0.9 % sodium chloride infusion, , Intravenous, Continuous, Georganna Skeans, MD; 0.9 % NaCl with KCl 20 mEq/ L infusion, , Intravenous, Continuous, Coralie Keens, MD, Last Rate: 50 mL/hr at 10/06/14 1837; acetaminophen (TYLENOL) tablet 325-650 mg, 325-650 mg, Oral, Q4H PRN, Evans Lance, MD; acetaminophen (TYLENOL) tablet 500 mg, 500 mg, Oral, Q6H PRN, Evans Lance, MD amLODipine (NORVASC) tablet 5 mg, 5 mg, Oral, Daily, Evans Lance, MD, 5 mg at 10/07/14 1120; antiseptic oral rinse (CPC / CETYLPYRIDINIUM CHLORIDE 0.05%)  solution 7 mL, 7 mL, Mouth Rinse, BID, Coralie Keens, MD, 7 mL at 10/07/14 1120; cyanocobalamin ((VITAMIN B-12)) injection 1,000 mcg, 1,000 mcg, Intramuscular, Once, Evans Lance, MD, 1,000 mcg at 10/07/14  1121 fentaNYL (SUBLIMAZE) 2,500 mcg in sodium chloride 0.9 % 250 mL (10 mcg/mL) infusion, 20-50 mcg/hr, Intravenous, Continuous, Joanell Rising, MD, Stopped at 10/03/14 1710; fentaNYL (SUBLIMAZE) injection 50 mcg, 50 mcg, Intravenous, Q2H PRN, Lisette Abu, PA-C; haloperidol lactate (HALDOL) injection 4 mg, 4 mg, Intravenous, Q6H PRN, Georganna Skeans, MD, 4 mg at 10/07/14 0532 HYDROcodone-acetaminophen (NORCO/VICODIN) 5-325 MG per tablet 1-2 tablet, 1-2 tablet, Oral, Q4H PRN, Georganna Skeans, MD; insulin aspart (novoLOG) injection 0-15 Units, 0-15 Units, Subcutaneous, 6 times per day, Lisette Abu, PA-C, 3 Units at 10/07/14 [phone]; LORazepam (ATIVAN) injection 1 mg, 1 mg, Intravenous, Q4H PRN, Georganna Skeans, MD, 1 mg at 10/07/14 0532 magic mouthwash w/lidocaine, 10 mL, Oral, TID, Georganna Skeans, MD, 10 mL at 10/07/14 1122; morphine 2 MG/ML injection 2 mg, 2 mg, Intravenous, Q1H PRN, Georganna Skeans, MD; neomycin-bacitracin-polymyxin (NEOSPORIN) ointment, , Topical, BID, Georganna Skeans, MD; ondansetron [facility]) tablet 4 mg, 4 mg, Oral, Q6H PRN **OR** ondansetron (ZOFRAN) injection 4 mg, 4 mg, Intravenous, Q6H PRN, Lisette Abu, PA-C ondansetron [facility]) injection 4 mg, 4 mg, Intravenous, Q6H PRN, Evans Lance, MD; pantoprazole (PROTONIX) EC tablet 40 mg, 40 mg, Oral, Daily, 40 mg at 10/07/14 1123 **OR** pantoprazole (PROTONIX) injection 40 mg, 40 mg, Intravenous, Daily, Lisette Abu, PA-C, 40 mg at 10/06/14 6812  Patients Current Diet: Diet Carb Modified (meds- whole with liquid, with puree if difficulties, cue for small bites/sips, alternate food/liquid, sit upright 30 minutes after meal)  Precautions / Restrictions Precautions Precautions: Fall Precaution Comments:  multiple falls at home  Restrictions Weight Bearing Restrictions: No   Prior Activity Level Community (5-7x/wk): Pt got out everyday and was driving. 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 "I should be using my cane but I didn't".   Home Assistive Devices / Equipment Home Assistive Devices/Equipment: CBG Meter, Eyeglasses, Radio producer (specify quad or straight), Shower chair with back Home Equipment: Cane - single point  Prior Functional Level Prior Function Level of Independence: Independent Comments: reports multiple falls and states "they are all running together now" so unable to report accurate amount  Current Functional Level Cognition  Arousal/Alertness: Awake/alert Overall Cognitive Status: Impaired/Different from baseline Current Attention Level: Focused Orientation Level: Oriented to person Following Commands: Follows one step commands with increased time, Follows multi-step commands inconsistently Safety/Judgement: Decreased awareness of safety, Decreased awareness of deficits General Comments: pt with increased lethargy today. pt thinks he is at home, but does recall being put in restraints last night. pt with poor recall of orientation when asked 72mns later orientation questions.  Attention: Selective Selective Attention: Appears intact Memory: Impaired Memory Impairment: Decreased recall of new information, Decreased short term memory Decreased Short Term Memory: Verbal basic, Functional basic Awareness: Appears intact Problem Solving: Impaired Problem Solving Impairment: Verbal complex Behaviors: Perseveration Safety/Judgment: Impaired Rancho LDuke EnergyScales of Cognitive Functioning: Confused/inappropriate/non-agitated   Extremity Assessment (includes Sensation/Coordination)          ADLs  Overall ADL's : Needs assistance/impaired Grooming: Oral  care, Wash/dry face, Standing, Minimal assistance Grooming Details (indicate cue type and reason): pt requires extended time to complete oral care and incr time to open tooth paste (fine motor). pt applying tooth paste properly in correct sequence Lower Body Bathing: Maximal assistance, Sit to/from stand Lower Body Bathing Details (indicate cue type and reason): pt with smear on incontinence of bowel and unaware. Peri care performed  Functional mobility during ADLs: Minimal assistance (LOB with head turns) General ADL Comments: Pt completed bed mobilty, sink level grooming and brief ambulation. pt asked to scan environment for colors due to glasses missing. pt scanning for objects midline to the R mainly. But with visual assessment pt unable to sustain horizontal R attention . Pt with lob with head turns.     Mobility  Overal bed mobility: Needs Assistance Bed Mobility: Supine to Sit Rolling: Min guard Supine to sit: Mod assist Sit to supine: Max assist, +2 for physical assistance General bed mobility comments: pt requiring A to come to sitting today. Needs cueing for eyes open and sequencing.     Transfers  Overall transfer level: Needs assistance Equipment used: 2 person hand held assist Transfers: Sit to/from Stand Sit to Stand: Min assist, +2 physical assistance General transfer comment: pt needs increased balance A today and leans mroe heavily to L side.     Ambulation / Gait / Stairs / Wheelchair Mobility  Ambulation/Gait Ambulation/Gait assistance: Min assist, +2 physical assistance Ambulation Distance (Feet): 60 Feet Assistive device: 2 person hand held assist Gait Pattern/deviations: Step-through pattern, Decreased stride length, Narrow base of support, Staggering left General Gait Details: pt needing 2nd person A today 2/2 decreased attention and increased L lateral lean. pt continues to need cueing for objects on L side.     Posture / Balance Dynamic Sitting  Balance Sitting balance - Comments: pt needs R UE support for balance today.     Special needs/care consideration BiPAP/CPAP no  CPM no  Continuous Drip IV no  Dialysis no  Life Vest no  Oxygen no Special Bed no  Trach Size no  Wound Vac (area) no  Skin - healing R frontal scalp hematoma, R UE foreman IV site, abrasions R face and scattered contusions arms/legs  Bowel mgmt: last BM on 10-06-14, incontinent Bladder mgmt: incontinet Diabetic mgmt - managed at home with insulin  Note: pt wears his glasses all the time. Wife had the glasses adjusted after his fall.  As of 08-07-14, pt is currently displaying behaviors consistent with [place].   Previous Home Environment Living Arrangements: Spouse/significant other Lives With: Spouse Available Help at Discharge: Family, Available 24 hours/day Type of Home: House Home Layout: One level Home Access: Stairs to enter Technical brewer of Steps: 3  Bathroom Shower/Tub: Public librarian, Charity fundraiser: Handicapped height Home Care Services: No  Discharge Living Setting Plans for Discharge Living Setting: Patient's home Type of Home at Discharge: House Discharge Home Layout: One level Discharge Home Access: Stairs to enter Entrance Stairs-Rails: Right, Left Entrance Stairs-Number of Steps: 3 Discharge Bathroom Shower/Tub: Tub/shower unit Discharge Bathroom Toilet: Handicapped height Does the patient have any problems obtaining your medications?: No  Social/Family/Support Systems Patient Roles: Spouse, Other (Comment) (active on his farm as well) Contact Information: wife Pamala Hurry is primary contact Anticipated Caregiver: wife Anticipated Caregiver's Contact Information: see above Ability/Limitations of Caregiver: no limitations Caregiver Availability: 24/7 Discharge Plan Discussed with Primary Caregiver: Yes Is Caregiver In Agreement with Plan?: Yes Does  Caregiver/Family have Issues with Lodging/Transportation while Pt is in Rehab?: No  Goals/Additional Needs Patient/Family Goal for Rehab: Mod Ind and Supervision with PT/OT/SLP Expected length of stay: 7-10 days Cultural Considerations: none Dietary Needs: carb modified Equipment Needs: to be determined Pt/Family Agrees to Admission and willing to participate: Yes (spoke with pt and his wife on 11-9 and 11-10) Program Orientation Provided & Reviewed with Pt/Caregiver Including Roles & Responsibilities: Yes  Decrease burden of Care through IP rehab admission: NA   Possible need for SNF placement upon discharge: not anticipated   Patient Condition: This patient's condition remains as documented in the consult dated 10-06-14, in which the Rehabilitation Physician determined and documented that the patient's condition is appropriate for intensive rehabilitative care in an inpatient rehabilitation facility. Will admit to inpatient rehab today.  Preadmission Screen Completed By: Nanetta Batty, PT, 10/07/2014 11:37 AM ______________________________________________________________________  Discussed status with Dr. Naaman Plummer on 10-06-14 at 1137 and received telephone approval for admission today.  Admission Coordinator: Nanetta Batty, PT, time 1137/Date 10-06-14          Cosigned by: Meredith Staggers, MD at 10/07/2014 12:14 PM  Revision History     Date/Time User Provider Type Action   10/07/2014 12:14 PM Meredith Staggers, MD Physician Cosign   10/07/2014 11:42 AM [facility] Admission Coordinator Sign

## 2014-10-07 NOTE — H&P (View-Only) (Signed)
Physical Medicine and Rehabilitation Admission H&P    No chief complaint on file. : Chief Complaint: Headache  HPI: Frank Simpson is a 80 y.o.right handed male with history of diabetes mellitus and peripheral neuropathy, diastolic congestive heart failure and coronary artery disease/ MI maintained on Brilinta and a baby aspirin prior to admission.admitted 10/03/2014 with history of frequent falls.Patient independent with a cane prior to admission living with his wife. He does drive short distances. Most recently he was working in the yard and felt dizzy and fell striking his head on a tractor with loss of consciousness.He was intubated in the ED. Cranial CT scan showed atrophy with small vessel disease no acute intracranial abnormalities. There was a right frontal scalp hematoma. CT of cervical spine as well as abdomen and pelvis unremarkable. Follow-up cardiology services for bouts of bradycardia with hypotension with recurrent episodes of intermittent complete heart block while on the monitor.Underwent placement of pacemaker 10/06/2014 per Dr. Osie Cheeks. Echocardiogram with ejection fraction of 29% grade 1 diastolic dysfunction. Patient remains off Brilinta per cardiology services.Hospital course patient was extubated 10/04/2014. Physical and occupational therapy evaluations completed. Recommendations of physical medicine rehabilitation consult. Patient was admitted for comprehensive rehabilitation program  ROS Review of Systems  Cardiovascular: Positive for palpitations.  Genitourinary: Positive for urgency.  Musculoskeletal: Positive for myalgias and falls.  Neurological: Positive for dizziness.  All other systems reviewed and are negative Past Medical History  Diagnosis Date  . Diabetes mellitus without complication   . CHF (congestive heart failure)   . Postherpetic neuralgia   . Dyslipidemia   . Ischemic heart disease   . Benign positional vertigo   . Shingles   .  Myocardial infarction   . Coronary artery disease   . Arthritis   . Cancer    Past Surgical History  Procedure Laterality Date  . Penile prosthesis implant    . Eye surgery    . Joint replacement    . Fracture surgery      foot, pin placement   Family History  Problem Relation Age of Onset  . Stroke Mother   . Diabetes Mother   . Cancer Father   . Diabetes Brother    Social History:  reports that he has never smoked. He has never used smokeless tobacco. He reports that he does not drink alcohol or use illicit drugs. Allergies:  Allergies  Allergen Reactions  . Januvia [Sitagliptin]   . Metformin And Related   . Oxycodone   . Penicillins    Medications Prior to Admission  Medication Sig Dispense Refill  . acetaminophen (TYLENOL) 500 MG tablet Take 500 mg by mouth every 6 (six) hours as needed for mild pain.    [name] Kitchen amLODipine (NORVASC) 5 MG tablet Take 5 mg by mouth daily.    [name] Kitchen aspirin 81 MG tablet Take 81 mg by mouth daily.    . cyanocobalamin (,VITAMIN B-12,) 1000 MCG/ML injection Inject 1,000 mcg into the muscle once.    . insulin lispro (HUMALOG) 100 UNIT/ML injection Inject 2-10 Units into the skin once. SLIDING SCALE: 150-200=2units, 201-250=4units, 251-300=6units, 301-350=8units, >350=10units    . isosorbide mononitrate (IMDUR) 30 MG 24 hr tablet Take 30 mg by mouth daily.    . meclizine (ANTIVERT) 25 MG tablet Take 25 mg by mouth every 6 (six) hours as needed for dizziness.    . nitroGLYCERIN (NITROSTAT) 0.4 MG SL tablet Place 0.4 mg under the tongue every 5 (five) minutes as needed for chest pain.    [name] Kitchen  rosuvastatin (CRESTOR) 40 MG tablet Take 40 mg by mouth daily.    . sertraline (ZOLOFT) 25 MG tablet Take 25 mg by mouth daily.    . tamsulosin (FLOMAX) 0.4 MG CAPS capsule Take 0.4 mg by mouth.    . ticagrelor (BRILINTA) 90 MG TABS tablet Take 90 mg by mouth 2 (two) times daily.    . vitamin B-12 (CYANOCOBALAMIN) 1000 MCG tablet Take 1,000 mcg by mouth every 30 (thirty)  days.      Home: Home Living Family/patient expects to be discharged to:: Private residence Living Arrangements: Spouse/significant other Available Help at Discharge: Family, Available 24 hours/day Type of Home: House Home Access: Stairs to enter [employer] of Steps:  (family present unsure as to how many) Home Layout: One level Home Equipment: Radio producer - single point  Lives With: Spouse   Functional History: Prior Function Level of Independence: Independent Comments: reports multiple falls and states "they are all running together now" so unable to report accurate amount  Functional Status:  Mobility: Bed Mobility Overal bed mobility: Needs Assistance Bed Mobility: Supine to Sit Rolling: Min guard Supine to sit: Mod assist Sit to supine: Max assist, +2 for physical assistance General bed mobility comments: pt requiring A to come to sitting today.  Needs cueing for eyes open and sequencing.   Transfers Overall transfer level: Needs assistance Equipment used: 2 person hand held assist Transfers: Sit to/from Stand Sit to Stand: Min assist, +2 physical assistance General transfer comment: pt needs increased balance A today and leans mroe heavily to L side.   Ambulation/Gait Ambulation/Gait assistance: Min assist, +2 physical assistance Ambulation Distance (Feet): 60 Feet Assistive device: 2 person hand held assist Gait Pattern/deviations: Step-through pattern, Decreased stride length, Narrow base of support, Staggering left General Gait Details: pt needing 2nd person A today 2/2 decreased attention and increased L lateral lean.  pt continues to need cueing for objects on L side.      ADL: ADL Overall ADL's : Needs assistance/impaired Grooming: Oral care, Wash/dry face, Standing, Minimal assistance Grooming Details (indicate cue type and reason): pt requires extended time to complete oral care and incr time to open tooth paste (fine motor). pt applying tooth paste  properly in correct sequence Lower Body Bathing: Maximal assistance, Sit to/from stand Lower Body Bathing Details (indicate cue type and reason): pt with smear on incontinence of bowel and unaware. Peri care performed Functional mobility during ADLs: Minimal assistance (LOB with head turns) General ADL Comments: Pt completed bed mobilty, sink level grooming and brief ambulation. pt asked to scan environment for colors due to glasses missing. pt scanning for objects midline to the R mainly. But with visual assessment pt  unable to sustain horizontal R attention . Pt with lob with head turns.   Cognition: Cognition Overall Cognitive Status: Impaired/Different from baseline Arousal/Alertness: Awake/alert Orientation Level: Oriented to person, Disoriented to time, Disoriented to place, Disoriented to situation Attention: Selective Selective Attention: Appears intact Memory: Impaired Memory Impairment: Decreased recall of new information, Decreased short term memory Decreased Short Term Memory: Verbal basic, Functional basic Awareness: Appears intact Problem Solving: Impaired Problem Solving Impairment: Verbal complex Behaviors: Perseveration Safety/Judgment: Impaired [place] [employer] Scales of Cognitive Functioning: Confused/inappropriate/non-agitated Cognition Arousal/Alertness: Lethargic Behavior During Therapy: Flat affect Overall Cognitive Status: Impaired/Different from baseline Area of Impairment: Orientation, Attention, Memory, Following commands, Safety/judgement, Awareness, Problem solving Orientation Level: Disoriented to, Place, Time, Situation Current Attention Level: Focused Memory: Decreased recall of precautions, Decreased short-term memory Following Commands: Follows one  step commands with increased time, Follows multi-step commands inconsistently Safety/Judgement: Decreased awareness of safety, Decreased awareness of deficits Awareness: Intellectual Problem Solving:  Slow processing, Difficulty sequencing, Requires verbal cues, Requires tactile cues General Comments: pt with increased lethargy today.  pt thinks he is at home, but does recall being put in restraints last night.  pt with poor recall of orientation when asked 49mns later orientation questions.    Physical Exam: Blood pressure 136/78, pulse 87, temperature 98.8 F (37.1 C), temperature source Axillary, resp. rate 27, height 5' 10" (1.778 m), weight 93.9 kg (207 lb 0.2 oz), SpO2 93 %. Physical Exam Constitutional: He is oriented to person, place, and time. Sitting in chair with WB in place. HENT: tongue dry/split in center Healing abrasion to the right scalp  Eyes: EOM are normal.  Neck: Normal range of motion. Neck supple. No thyromegaly present.  Cardiovascular: Normal rate and regular rhythm. Pacemaker site clean and dry with steristrips Respiratory: Effort normal and breath sounds normal. No respiratory distress.  GI: Soft. Bowel sounds are normal. He exhibits no distension.  Neurological: He is alert and oriented to person, place, and time.  Mood remains flat but appropriate. Confused but non-agtiated. He does make good eye contact with examiner. He was able to provide his name, age date of birth.  [facility]. Follows commands. Didn't know today's date. Mild right facial droop. [name]. LE: 4/5 proximal to distal. Decreased sensation to PP/LT in both feet to ankles.  Skin:  Abrasions on right face,scalp. Scattered bruises/lacs on arms legs.  Psychiatric:  Cooperative. Remains slightly confused   Results for orders placed or performed during the hospital encounter of 10/03/14 (from the past 48 hour(s))  Glucose, capillary     Status: Abnormal   Collection Time: 10/05/14 11:47 AM  Result Value Ref Range   Glucose-Capillary 249 (H) 70 - 99 mg/dL  Glucose, capillary     Status: Abnormal   Collection Time: 10/05/14  3:36 PM   Result Value Ref Range   Glucose-Capillary 264 (H) 70 - 99 mg/dL  Glucose, capillary     Status: Abnormal   Collection Time: 10/05/14  8:03 PM  Result Value Ref Range   Glucose-Capillary 223 (H) 70 - 99 mg/dL   Comment 1 Notify RN    Comment 2 Documented in Chart   Glucose, capillary     Status: Abnormal   Collection Time: 10/05/14 11:37 PM  Result Value Ref Range   Glucose-Capillary 211 (H) 70 - 99 mg/dL   Comment 1 Documented in Chart    Comment 2 Notify RN   Glucose, capillary     Status: Abnormal   Collection Time: 10/06/14  3:47 AM  Result Value Ref Range   Glucose-Capillary 169 (H) 70 - 99 mg/dL   Comment 1 Documented in Chart    Comment 2 Notify RN   Glucose, capillary     Status: Abnormal   Collection Time: 10/06/14  8:39 AM  Result Value Ref Range   Glucose-Capillary 183 (H) 70 - 99 mg/dL   Comment 1 Notify RN    Comment 2 Documented in Chart   Provider-confirm verbal Blood Bank order - Type & Screen, RBC, FFP; 2 Units; Level 1 Trauma, Emergency Release     Status: None   Collection Time: 10/06/14 12:00 PM  Result Value Ref Range   Blood product order confirm MD AUTHORIZATION REQUESTED   Glucose, capillary  Status: Abnormal   Collection Time: 10/06/14 12:01 PM  Result Value Ref Range   Glucose-Capillary 183 (H) 70 - 99 mg/dL   Comment 1 Notify RN    Comment 2 Documented in Chart   Triglycerides     Status: None   Collection Time: 10/06/14  2:02 PM  Result Value Ref Range   Triglycerides 92 <150 mg/dL  Glucose, capillary     Status: Abnormal   Collection Time: 10/06/14  4:33 PM  Result Value Ref Range   Glucose-Capillary 123 (H) 70 - 99 mg/dL  Glucose, capillary     Status: Abnormal   Collection Time: 10/06/14  5:19 PM  Result Value Ref Range   Glucose-Capillary 127 (H) 70 - 99 mg/dL  Glucose, capillary     Status: Abnormal   Collection Time: 10/06/14  7:48 PM  Result Value Ref Range   Glucose-Capillary 136 (H) 70 - 99 mg/dL  Glucose, capillary      Status: Abnormal   Collection Time: 10/06/14 11:12 PM  Result Value Ref Range   Glucose-Capillary 218 (H) 70 - 99 mg/dL  Glucose, capillary     Status: Abnormal   Collection Time: 10/07/14  3:21 AM  Result Value Ref Range   Glucose-Capillary 208 (H) 70 - 99 mg/dL  Glucose, capillary     Status: Abnormal   Collection Time: 10/07/14  8:53 AM  Result Value Ref Range   Glucose-Capillary 181 (H) 70 - 99 mg/dL   Comment 1 Notify RN    Comment 2 Documented in Chart    Dg Chest Port 1 View  10/06/2014   CLINICAL DATA:  Postprocedural radiograph.  Pacemaker placement.  EXAM: PORTABLE CHEST - 1 VIEW  COMPARISON:  10/03/2014.  FINDINGS: Two lead LEFT subclavian pacemaker is present. There is no pneumothorax. Aortic arch atherosclerosis. Pacemaker leads are positioned over the RIGHT atrium and RIGHT ventricle. Lung volumes are low. No pulmonary edema. No airspace consolidation.  IMPRESSION: New dual lead LEFT subclavian cardiac pacemaker.  No pneumothorax.   Electronically Signed   By: Dereck Ligas M.D.   On: 10/06/2014 19:33       Medical Problem List and Plan: 1. Functional deficits secondary to Traumatic brain injury/scalp hematoma after a fall with history of gait disorder 2.  DVT Prophylaxis/Anticoagulation: SCDs. Monitor for any signs of DVT 3. Pain Management: hydrocodone as needed. Monitor with increased mobility 4. Symptomatic bradycardia with complete heart block. Status post pacemaker placement 10/06/2014 per Dr. Crissie Sickles.Aspirin has been resumed.Brilinta not resumed per cardiology services 5. Neuropsych: This patient is not capable of making decisions on his own behalf. 6. Skin/Wound Care: routine skin checks monitoring of scalp abrasion 7. Fluids/Electrolytes/Nutrition: strict I&O's. Follow-up chemistries. Provide nutritional supplements 8.Hypertension. Norvasc 5 mg daily,, Imdur 30 mg daily.Monitor with increased mobility 9.Diabetes mellitus with peripheral neuropathy. Check  blood sugars before meals and at bedtime. Continue sliding scale insulin for now. 10.BPH. Check PVR 3. Patient on Flomax 0.4 mg daily prior to admission and will resume 11.Mood/depression.Patient on Zoloft 25 mg daily prior to admission. Provide emotional support 12.Hyperlipidemia. Crestor   Post Admission Physician Evaluation: 1. Functional deficits secondary  to TBI after fall. Pt has hx of gait disorder with falls. 2. Patient is admitted to receive collaborative, interdisciplinary care between the physiatrist, rehab nursing staff, and therapy team. 3. Patient's level of medical complexity and substantial therapy needs in context of that medical necessity cannot be provided at a lesser intensity of care such as a SNF. 4.  Patient has experienced substantial functional loss from his/her baseline which was documented above under the "Functional History" and "Functional Status" headings.  Judging by the patient's diagnosis, physical exam, and functional history, the patient has potential for functional progress which will result in measurable gains while on inpatient rehab.  These gains will be of substantial and practical use upon discharge  in facilitating mobility and self-care at the household level. 5. Physiatrist will provide 24 hour management of medical needs as well as oversight of the therapy plan/treatment and provide guidance as appropriate regarding the interaction of the two. 6. 24 hour rehab nursing will assist with bladder management, bowel management, safety, skin/wound care, disease management, medication administration, pain management and patient education  and help integrate therapy concepts, techniques,education, etc. 7. PT will assess and treat for/with: Lower extremity strength, range of motion, stamina, balance, functional mobility, safety, adaptive techniques and equipment, NMR, pain mgt, family education on safety, CV parameters during functional activities.   Goals are:  supervision. 8. OT will assess and treat for/with: ADL's, functional mobility, safety, upper extremity strength, adaptive techniques and equipment, NMR, cognitive perceptual awareness, CV parameters/tolerance during therapy, family ed.   Goals are: supervision. Therapy may not yet proceed with showering this patient. 9. SLP will assess and treat for/with: cognition, communication.  Goals are: supervision to mod I. 10. Case Management and Social Worker will assess and treat for psychological issues and discharge planning. 11. Team conference will be held weekly to assess progress toward goals and to determine barriers to discharge. 12. Patient will receive at least 3 hours of therapy per day at least 5 days per week. 13. ELOS: 9-10 days       14. Prognosis:  excellent     Meredith Staggers, MD, [facility] 10/07/2014   10/07/2014

## 2014-10-07 NOTE — Progress Notes (Signed)
Pt admitted to unit at 1425 with wife at bedside. Reviewed rehab booklet, process, and safety plan with wife with verbal understanding.  Bed alarm on, SRx3, call bell within reach. Wife at bedside. Reviewed use of call bell with pt with no evidence of learning. Cont. To monitor pt.

## 2014-10-07 NOTE — Progress Notes (Signed)
Speech Language Pathology Treatment: Dysphagia;Cognitive-Linquistic  Patient Details Name: Frank Simpson MRN: [id number] DOB: 1931/09/06 Today's Date: 10/07/2014 Time: 6578-4696 SLP Time Calculation (min) (ACUTE ONLY): 17 min  Assessment / Plan / Recommendation Clinical Impression  Pt continues to present most consistently as a Rancho level V, confused and appropriate. Today he was lethargic throughout treatment and needed Mod multimodal cueing and stimulation to sustain alertness. He is oriented x3 and requires Mod cues for utilization of environmental cues for temporal orientation. SLP provided Mod cues for intellectual awareness of current functional level. Pt had just completed lunch meal upon arrival, and required Max cues from SLP for recall of items on lunch tray. Although he did not want more food, he drank water via straw with no overt signs of aspiration and without any cueing provided by therapist. Recommend to continue current diet with brief f/u to observe with solids and liquids, ideally during a meal setting. Note that patient is transferring to CIR today.   HPI HPI: Pt is a 80 y.o. male with PMH of frequent falls, CAD, DM2, ?congestive heart failure, dyslipidemia, MI. He was feeling dizzy and fell while working in the yard with his wife. She sent him to go back to the house and he fell again on the way, striking his head on a tractor. EMS was called and he was unresponsive for them. He came in as a level 1 trauma. He was somewhat responsive here but still had a low GCS and was intubated by the EDP on 11/6, extubated 11/7 in a.m. Bedside swallow eval ordered to evaluate swallow function post-extubation. No evidence for acute intracranial abnormality.   Pertinent Vitals Pain Assessment: No/denies pain  SLP Plan  Continue with current plan of care    Recommendations Diet recommendations: Regular;Thin liquid Liquids provided via: Cup;Straw Medication Administration: Whole meds with  liquid Supervision: Intermittent supervision to cue for compensatory strategies Compensations: Slow rate;Small sips/bites;Follow solids with liquid Postural Changes and/or Swallow Maneuvers: Seated upright 90 degrees;Upright 30-60 min after meal      Oral Care Recommendations: Oral care BID Follow up Recommendations: Inpatient Rehab;24 hour supervision/assistance Plan: Continue with current plan of care    GO      Germain Osgood, M.A. CCC-SLP [phone]  Germain Osgood 10/07/2014, 2:35 PM

## 2014-10-07 NOTE — Progress Notes (Addendum)
Rehab admissions - I am following pt's case and have reviewed latest notes. I spoke with Megan, Trauma PA and received medical clearance for pt to come to inpatient rehab. I spoke with pt. Pt remains pleasantly confused, thinking he is at home. I updated pt's wife by phone and she is in agreement for the plan to come to inpatient rehab. I plan to meet with pt's wife at 57 today to complete admission paperwork.  I tried to leave a voicemail with pt's son to share this update but voicemail was full.  I will update RN shortly and trauma case manager/social worker as well. We will admit today to inpatient rehab.  Please call me with any questions. Thanks.  Nanetta Batty, PT Rehabilitation Admissions Coordinator [phone]

## 2014-10-07 NOTE — Progress Notes (Signed)
Patient ID: Frank Simpson, male   DOB: 05-10-1931, 80 y.o.   MRN: [id number] 1 Day Post-Op  Subjective: Offers no complaint, just walked with therapies  Objective: Vital signs in last 24 hours: Temp:  [98.2 F (36.8 C)-98.4 F (36.9 C)] 98.4 F (36.9 C) (11/10 0300) Pulse Rate:  [56-101] 87 (11/10 0700) Resp:  [17-46] 27 (11/10 0700) BP: (136-183)/(68-114) 136/78 mmHg (11/10 0700) SpO2:  [90 %-99 %] 93 % (11/10 0700) Last BM Date: 10/06/14  Intake/Output from previous day: 11/09 0701 - 11/10 0700 In: 450.8 [I.V.:450.8] Out: 975 [Urine:975] Intake/Output this shift:    General appearance: cooperative Head: abrasion R scalp Resp: clear to auscultation bilaterally Cardio: regular rate and rhythm GI: soft, NT Extremities: sling LUE Neuro: oriented to month, not year. Not oriented to place. F/C  Lab Results: CBC   Recent Labs  10/05/14 0312  WBC 7.8  HGB 13.4  HCT 38.5*  PLT 121*   BMET  Recent Labs  10/05/14 0312  NA 141  K 4.1  CL 107  CO2 23  GLUCOSE 136*  BUN 15  CREATININE 1.49*  CALCIUM 8.2*   PT/INR No results for input(s): LABPROT, INR in the last 72 hours. ABG No results for input(s): PHART, HCO3 in the last 72 hours.  Invalid input(s): PCO2, PO2  Studies/Results: Dg Chest Port 1 View  10/06/2014   CLINICAL DATA:  Postprocedural radiograph.  Pacemaker placement.  EXAM: PORTABLE CHEST - 1 VIEW  COMPARISON:  10/03/2014.  FINDINGS: Two lead LEFT subclavian pacemaker is present. There is no pneumothorax. Aortic arch atherosclerosis. Pacemaker leads are positioned over the RIGHT atrium and RIGHT ventricle. Lung volumes are low. No pulmonary edema. No airspace consolidation.  IMPRESSION: New dual lead LEFT subclavian cardiac pacemaker.  No pneumothorax.   Electronically Signed   By: Dereck Ligas M.D.   On: 10/06/2014 19:33    Anti-infectives: Anti-infectives    Start     Dose/Rate Route Frequency Ordered Stop   10/07/14 0400  vancomycin  (VANCOCIN) IVPB 1000 mg/200 mL premix     1,000 mg200 mL/hr over 60 Minutes Intravenous Every 12 hours 10/06/14 1904 10/07/14 1559   10/06/14 1915  gentamicin (GARAMYCIN) 80 mg in sodium chloride irrigation 0.9 % 500 mL irrigation  Status:  Discontinued     80 mg Irrigation On call 10/06/14 1904 10/06/14 2021   10/06/14 1915  vancomycin (VANCOCIN) IVPB 1000 mg/200 mL premix  Status:  Discontinued     1,000 mg200 mL/hr over 60 Minutes Intravenous On call 10/06/14 1904 10/06/14 2031   10/06/14 0500  gentamicin (GARAMYCIN) 80 mg in sodium chloride irrigation 0.9 % 500 mL irrigation  Status:  Discontinued     80 mg Irrigation On call 10/05/14 2042 10/06/14 1904   10/06/14 0500  vancomycin (VANCOCIN) IVPB 1000 mg/200 mL premix  Status:  Discontinued     1,000 mg200 mL/hr over 60 Minutes Intravenous On call 10/05/14 2042 10/06/14 1904      Assessment/Plan: Fall Scalp abrasion and hematoma Concussion - still some confusion, TBI team therapies FEN - resuming diet DM - SSI CV/junctional bradycardia - S/P pacemaker placement yesterday, off Brilinta per cardiology Dispo - to SDU, CIR consult  LOS: 4 days    Georganna Skeans, MD, MPH, FACS Trauma: [phone] General Surgery: [phone]  10/07/2014
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
## 2014-10-07 NOTE — Progress Notes (Signed)
Patient ID: [name], male   DOB: 1931-03-29, 80 y.o.   MRN: [id number]    Patient Name: Frank Simpson Date of Encounter: 10/07/2014     Principal Problem:   TBI (traumatic brain injury) Active Problems:   Syncope   Junctional bradycardia   Fall    SUBJECTIVE  No chest pain or sob. Note plans for rehab.  CURRENT MEDS . amLODipine  5 mg Oral Daily  . antiseptic oral rinse  7 mL Mouth Rinse BID  . cyanocobalamin  1,000 mcg Intramuscular Once  . insulin aspart  0-15 Units Subcutaneous 6 times per day  . magic mouthwash w/lidocaine  10 mL Oral TID  . neomycin-bacitracin-polymyxin   Topical BID  . pantoprazole  40 mg Oral Daily   Or  . pantoprazole (PROTONIX) IV  40 mg Intravenous Daily    OBJECTIVE  Filed Vitals:   10/07/14 0800 10/07/14 0900 10/07/14 1000 10/07/14 1145  BP: 151/83 156/83 142/89   Pulse: 86 95 86   Temp: 98.8 F (37.1 C)   98.1 F (36.7 C)  TempSrc: Axillary   Oral  Resp: _0 Height:      Weight:      SpO2: 94% 95% 100%     Intake/Output Summary (Last 24 hours) at 10/07/14 1232 Last data filed at 10/07/14 0900  Gross per 24 hour  Intake 640.83 ml  Output    990 ml  Net -349.17 ml   Filed Weights   10/03/14 1134 10/03/14 1400  Weight: 205 lb (92.987 kg) 207 lb 0.2 oz (93.9 kg)    PHYSICAL EXAM  General: Pleasant, elderly man, NAD. Neuro: Alert and oriented X 3. Moves all extremities spontaneously. Psych: Normal affect. HEENT:  Normal, except for multiple ecchymotic areas.  Neck: Supple without bruits or JVD. Lungs:  Resp regular and unlabored, CTA. No hematoma. Heart: RRR no s3, s4, or murmurs. Abdomen: Soft, non-tender, non-distended, BS + x 4.  Extremities: No clubbing, cyanosis or edema. DP/PT/Radials 2+ and equal bilaterally.  Accessory Clinical Findings  CBC  Recent Labs  10/05/14 0312  WBC 7.8  HGB 13.4  HCT 38.5*  MCV 90.2  PLT 263*   Basic Metabolic Panel  Recent Labs  10/05/14 0312  NA 141  K  4.1  CL 107  CO2 23  GLUCOSE 136*  BUN 15  CREATININE 1.49*  CALCIUM 8.2*   Liver Function Tests No results for input(s): AST, ALT, ALKPHOS, BILITOT, PROT, ALBUMIN in the last 72 hours. No results for input(s): LIPASE, AMYLASE in the last 72 hours. Cardiac Enzymes No results for input(s): CKTOTAL, CKMB, CKMBINDEX, TROPONINI in the last 72 hours. BNP Invalid input(s): POCBNP D-Dimer No results for input(s): DDIMER in the last 72 hours. Hemoglobin A1C No results for input(s): HGBA1C in the last 72 hours. Fasting Lipid Panel  Recent Labs  10/06/14 1402  TRIG 92   Thyroid Function Tests No results for input(s): TSH, T4TOTAL, T3FREE, THYROIDAB in the last 72 hours.  Invalid input(s): FREET3  TELE  nsr with atrial pacing  Radiology/Studies  Ct Head Wo Contrast  10/03/2014   CLINICAL DATA:  Level 1 trauma.  Fell and hit head on tractor.  EXAM: CT HEAD WITHOUT CONTRAST  CT MAXILLOFACIAL WITHOUT CONTRAST  CT CERVICAL SPINE WITHOUT CONTRAST  TECHNIQUE: Multidetector CT imaging of the head, cervical spine, and maxillofacial structures were performed using the standard protocol without intravenous contrast. Multiplanar CT image reconstructions of the cervical spine and maxillofacial structures were  also generated.  COMPARISON:  None.  FINDINGS: CT HEAD FINDINGS  There is no intra or extra-axial fluid collection or mass lesion. The basilar cisterns and ventricles have a normal appearance. There is no CT evidence for acute infarction or hemorrhage. There is central and cortical atrophy. Mild periventricular white matter changes are consistent with small vessel disease.  Bone windows show right frontal scalp hematoma without associated underlying calvarial fracture. There is mild mucoperiosteal thickening of the ethmoid and axillary sinuses.  CT MAXILLOFACIAL FINDINGS  The globes, orbits are intact. The zygomatic arches, mandible, and temporomandibular joints are intact. Nasal bones and bony  nasal septum are intact. Pterygoid plates and skullbase are intact.  Patient has endotracheal tube. Nasogastric tube is coiled in the patient's mass. There are changes of chronic sinusitis involving the ethmoid and maxillary sinuses. No air-fluid levels are identified within the paranasal sinuses. No mastoid effusions. There is cerumen within the right external auditory canal.  CT CERVICAL SPINE FINDINGS  There is normal alignment of the cervical spine. There is no evidence for acute fracture or dislocation. Prevertebral soft tissues have a normal appearance. Lung apices have a normal appearance. There is atherosclerotic calcification of the carotid arteries.  IMPRESSION: 1. Atrophy and small vessel disease. 2.  No evidence for acute intracranial abnormality. 3. Right frontal scalp hematoma. 4. Chronic sinusitis. 5. Orogastric tube coiled within the patient's mouth. 6. No evidence for acute fracture of the maxillofacial bones. 7. Degenerative changes without evidence for acute abnormality of the cervical spine.   Electronically Signed   By: Shon Hale M.D.   On: 10/03/2014 12:48   Ct Chest W Contrast  10/03/2014   CLINICAL DATA:  Level 1 trauma. The patient fell and hit head on tractor. Right frontal scalp hematoma.  EXAM: CT CHEST, ABDOMEN, AND PELVIS WITH CONTRAST  TECHNIQUE: Multidetector CT imaging of the chest, abdomen and pelvis was performed following the standard protocol during bolus administration of intravenous contrast.  CONTRAST:  80 cc Omnipaque 300  COMPARISON:  Chest x-ray and AP pelvis on 10/03/2014  FINDINGS: CT CHEST FINDINGS  Heart: The heart is mildly enlarged. No pericardial effusion. There is dense atherosclerotic calcification of the coronary arteries.  Vascular structures: The aorta is tortuous and calcified. It ascending aorta is ectatic, measuring up to 3 cm in diameter. No evidence for vascular injury.  Mediastinum/thyroid: No mediastinal, hilar, or axillary adenopathy. Endotracheal  tube is in place with tip above the carina. Nasogastric tube is in place to the stomach.  Lungs/Airways: There is bibasilar dependent change. No pleural effusions. No pneumothorax. Prior right shoulder tenderness repair.  Chest wall/osseous structures: No acute displaced fractures are identified. There is moderate degenerative change throughout the thoracic spine. Sternum is intact.  CT ABDOMEN AND PELVIS FINDINGS  Upper abdomen: No focal abnormality identified within the liver, spleen, pancreas, or adrenal glands. The kidneys show symmetric excretion bilaterally without focal mass. Gallbladder is present.  Gastrointestinal tract: And the stomach contains nasogastric tube. Small bowel loops are normal in appearance. There are scattered colonic diverticula but no evidence for acute diverticulitis or colonic injury. The appendix is well seen and has a normal appearance.  Pelvis: Patient has a penile prosthesis. The urinary bladder has a normal appearance. Prostatic calcifications are present. No free pelvic fluid.  Retroperitoneum: No retroperitoneal or mesenteric adenopathy. There is dense atherosclerotic calcification of the abdominal aorta. No aneurysm or dissection.  Abdominal wall: No acute abnormality.  Osseous structures: There is an old wedge compression  fracture of L2 with significant loss of height. Significant osteophytes are identified throughout the lower thoracic and lumbar spine. No suspicious lytic or blastic lesions are identified.  IMPRESSION: 1. Cardiomegaly and dense atherosclerotic calcification of the coronary arteries and aorta. 2. No evidence for vascular injury. 3. Dependent change within the lungs bilaterally. 4. No evidence for acute displaced fracture. 5. No evidence for injury of the solid organs or bowel. 6. Nasogastric tube to the stomach. 7. Endotracheal to to the lower trachea. 8. Old wedge compression fracture of L2.   Electronically Signed   By: Shon Hale M.D.   On: 10/03/2014  12:42   Ct Cervical Spine Wo Contrast  10/03/2014   CLINICAL DATA:  Level 1 trauma.  Fell and hit head on tractor.  EXAM: CT HEAD WITHOUT CONTRAST  CT MAXILLOFACIAL WITHOUT CONTRAST  CT CERVICAL SPINE WITHOUT CONTRAST  TECHNIQUE: Multidetector CT imaging of the head, cervical spine, and maxillofacial structures were performed using the standard protocol without intravenous contrast. Multiplanar CT image reconstructions of the cervical spine and maxillofacial structures were also generated.  COMPARISON:  None.  FINDINGS: CT HEAD FINDINGS  There is no intra or extra-axial fluid collection or mass lesion. The basilar cisterns and ventricles have a normal appearance. There is no CT evidence for acute infarction or hemorrhage. There is central and cortical atrophy. Mild periventricular white matter changes are consistent with small vessel disease.  Bone windows show right frontal scalp hematoma without associated underlying calvarial fracture. There is mild mucoperiosteal thickening of the ethmoid and axillary sinuses.  CT MAXILLOFACIAL FINDINGS  The globes, orbits are intact. The zygomatic arches, mandible, and temporomandibular joints are intact. Nasal bones and bony nasal septum are intact. Pterygoid plates and skullbase are intact.  Patient has endotracheal tube. Nasogastric tube is coiled in the patient's mass. There are changes of chronic sinusitis involving the ethmoid and maxillary sinuses. No air-fluid levels are identified within the paranasal sinuses. No mastoid effusions. There is cerumen within the right external auditory canal.  CT CERVICAL SPINE FINDINGS  There is normal alignment of the cervical spine. There is no evidence for acute fracture or dislocation. Prevertebral soft tissues have a normal appearance. Lung apices have a normal appearance. There is atherosclerotic calcification of the carotid arteries.  IMPRESSION: 1. Atrophy and small vessel disease. 2.  No evidence for acute intracranial  abnormality. 3. Right frontal scalp hematoma. 4. Chronic sinusitis. 5. Orogastric tube coiled within the patient's mouth. 6. No evidence for acute fracture of the maxillofacial bones. 7. Degenerative changes without evidence for acute abnormality of the cervical spine.   Electronically Signed   By: Shon Hale M.D.   On: 10/03/2014 12:48   Ct Abdomen Pelvis W Contrast  10/03/2014   CLINICAL DATA:  Level 1 trauma. The patient fell and hit head on tractor. Right frontal scalp hematoma.  EXAM: CT CHEST, ABDOMEN, AND PELVIS WITH CONTRAST  TECHNIQUE: Multidetector CT imaging of the chest, abdomen and pelvis was performed following the standard protocol during bolus administration of intravenous contrast.  CONTRAST:  80 cc Omnipaque 300  COMPARISON:  Chest x-ray and AP pelvis on 10/03/2014  FINDINGS: CT CHEST FINDINGS  Heart: The heart is mildly enlarged. No pericardial effusion. There is dense atherosclerotic calcification of the coronary arteries.  Vascular structures: The aorta is tortuous and calcified. It ascending aorta is ectatic, measuring up to 3 cm in diameter. No evidence for vascular injury.  Mediastinum/thyroid: No mediastinal, hilar, or axillary adenopathy. Endotracheal tube is  in place with tip above the carina. Nasogastric tube is in place to the stomach.  Lungs/Airways: There is bibasilar dependent change. No pleural effusions. No pneumothorax. Prior right shoulder tenderness repair.  Chest wall/osseous structures: No acute displaced fractures are identified. There is moderate degenerative change throughout the thoracic spine. Sternum is intact.  CT ABDOMEN AND PELVIS FINDINGS  Upper abdomen: No focal abnormality identified within the liver, spleen, pancreas, or adrenal glands. The kidneys show symmetric excretion bilaterally without focal mass. Gallbladder is present.  Gastrointestinal tract: And the stomach contains nasogastric tube. Small bowel loops are normal in appearance. There are scattered  colonic diverticula but no evidence for acute diverticulitis or colonic injury. The appendix is well seen and has a normal appearance.  Pelvis: Patient has a penile prosthesis. The urinary bladder has a normal appearance. Prostatic calcifications are present. No free pelvic fluid.  Retroperitoneum: No retroperitoneal or mesenteric adenopathy. There is dense atherosclerotic calcification of the abdominal aorta. No aneurysm or dissection.  Abdominal wall: No acute abnormality.  Osseous structures: There is an old wedge compression fracture of L2 with significant loss of height. Significant osteophytes are identified throughout the lower thoracic and lumbar spine. No suspicious lytic or blastic lesions are identified.  IMPRESSION: 1. Cardiomegaly and dense atherosclerotic calcification of the coronary arteries and aorta. 2. No evidence for vascular injury. 3. Dependent change within the lungs bilaterally. 4. No evidence for acute displaced fracture. 5. No evidence for injury of the solid organs or bowel. 6. Nasogastric tube to the stomach. 7. Endotracheal to to the lower trachea. 8. Old wedge compression fracture of L2.   Electronically Signed   By: Shon Hale M.D.   On: 10/03/2014 12:42   Dg Pelvis Portable  10/03/2014   CLINICAL DATA:  Fall from tractor.  Trauma  EXAM: PORTABLE PELVIS 1-2 VIEWS  COMPARISON:  None.  FINDINGS: There is no evidence of pelvic fracture or diastasis. No pelvic bone lesions are seen. Penile prosthesis  IMPRESSION: Negative.   Electronically Signed   By: Franchot Gallo M.D.   On: 10/03/2014 11:49   Dg Chest Port 1 View  10/06/2014   CLINICAL DATA:  Postprocedural radiograph.  Pacemaker placement.  EXAM: PORTABLE CHEST - 1 VIEW  COMPARISON:  10/03/2014.  FINDINGS: Two lead LEFT subclavian pacemaker is present. There is no pneumothorax. Aortic arch atherosclerosis. Pacemaker leads are positioned over the RIGHT atrium and RIGHT ventricle. Lung volumes are low. No pulmonary edema. No  airspace consolidation.  IMPRESSION: New dual lead LEFT subclavian cardiac pacemaker.  No pneumothorax.   Electronically Signed   By: Dereck Ligas M.D.   On: 10/06/2014 19:33   Dg Chest Portable 1 View  10/03/2014   CLINICAL DATA:  80 year old male status post fall from tractor. Level 1 trauma. Initial encounter.  EXAM: PORTABLE CHEST - 1 VIEW  COMPARISON:  None.  FINDINGS: Portable AP supine view at 1123 hrs. Intubated. Endotracheal tube tip in good position just below the clavicles. Mildly rotated to the right. Normal cardiac size and mediastinal contours. No pneumothorax, pleural effusion, or pulmonary contusion identified on this supine view. No acute osseous injury identified in the visible thorax.  IMPRESSION: 1. Intubated, ET tube in good position. 2.  No acute traumatic injury identified.   Electronically Signed   By: Lars Pinks M.D.   On: 10/03/2014 11:49   Ct Maxillofacial Wo Cm  10/03/2014   CLINICAL DATA:  Level 1 trauma.  Fell and hit head on tractor.  EXAM:  CT HEAD WITHOUT CONTRAST  CT MAXILLOFACIAL WITHOUT CONTRAST  CT CERVICAL SPINE WITHOUT CONTRAST  TECHNIQUE: Multidetector CT imaging of the head, cervical spine, and maxillofacial structures were performed using the standard protocol without intravenous contrast. Multiplanar CT image reconstructions of the cervical spine and maxillofacial structures were also generated.  COMPARISON:  None.  FINDINGS: CT HEAD FINDINGS  There is no intra or extra-axial fluid collection or mass lesion. The basilar cisterns and ventricles have a normal appearance. There is no CT evidence for acute infarction or hemorrhage. There is central and cortical atrophy. Mild periventricular white matter changes are consistent with small vessel disease.  Bone windows show right frontal scalp hematoma without associated underlying calvarial fracture. There is mild mucoperiosteal thickening of the ethmoid and axillary sinuses.  CT MAXILLOFACIAL FINDINGS  The globes,  orbits are intact. The zygomatic arches, mandible, and temporomandibular joints are intact. Nasal bones and bony nasal septum are intact. Pterygoid plates and skullbase are intact.  Patient has endotracheal tube. Nasogastric tube is coiled in the patient's mass. There are changes of chronic sinusitis involving the ethmoid and maxillary sinuses. No air-fluid levels are identified within the paranasal sinuses. No mastoid effusions. There is cerumen within the right external auditory canal.  CT CERVICAL SPINE FINDINGS  There is normal alignment of the cervical spine. There is no evidence for acute fracture or dislocation. Prevertebral soft tissues have a normal appearance. Lung apices have a normal appearance. There is atherosclerotic calcification of the carotid arteries.  IMPRESSION: 1. Atrophy and small vessel disease. 2.  No evidence for acute intracranial abnormality. 3. Right frontal scalp hematoma. 4. Chronic sinusitis. 5. Orogastric tube coiled within the patient's mouth. 6. No evidence for acute fracture of the maxillofacial bones. 7. Degenerative changes without evidence for acute abnormality of the cervical spine.   Electronically Signed   By: Shon Hale M.D.   On: 10/03/2014 12:48    ASSESSMENT AND PLAN  1. Syncope 2. Intermittent CHB 3. S/p PPM - his device is working normally and CXR is ok. From my perspective he may proceed with rehab.  Rayaan Garguilo,M.D.  Paysley Poplar,M.D.  10/07/2014 12:32 PM

## 2014-10-07 NOTE — Clinical Social Work Note (Signed)
Clinical Social Worker continuing to follow patient and family for support and discharge planning needs.  Per inpatient rehab admissions coordinator, patient will likely transition directly to inpatient rehab from ICU.  Clinical Social Worker will sign off for now as social work intervention is no longer needed. Please consult [place] again if new need arises.  Barbette Or, [place]

## 2014-10-07 NOTE — Progress Notes (Signed)
Medicare IM (Important Message) delivered to patient's room today by me prior to his discharge.   Sandi Mariscal, RN BSN MHA CCM  Case Manager, Trauma Service/Unit 28M [phone]

## 2014-10-07 NOTE — Progress Notes (Signed)
Meredith Staggers, MD Physician Signed Physical Medicine and Rehabilitation Consult Note 10/06/2014 10:50 AM  Related encounter: ED to Hosp-Admission (Discharged) from 10/03/2014 in [facility]    Expand All Collapse All        Physical Medicine and Rehabilitation Consult Reason for Consult:TBI Referring Physician: trauma service   HPI: Frank Simpson is a 80 y.o.right handed male with history of diabetes mellitus and peripheral neuropathy, diastolic congestive heart failure and coronary artery disease MI.admitted 10/03/2014 with history of frequent falls.Patient independent with a cane prior to admission living with his wife. He does drive short distances. Most recently he was working in the yard and felt dizzy and fell striking his head on a tractor with loss of consciousness.He was intubated in the ED. Cranial CT scan showed atrophy with small vessel disease no acute intracranial abnormalities. There was a right frontal scalp hematoma. CT of cervical spine as well as abdomen and pelvis unremarkable. Follow-up cardiology services for bouts of bradycardia with hypotension with recurrent episodes of intermittent complete heart block while on the monitor.. Echocardiogram with ejection fraction of 70% grade 1 diastolic dysfunction. Plan was for permanent pacemaker 10/06/2014.hospital course patient was extubated 10/04/2014. Physical and occupational therapy evaluations completed. Recommendations of physical medicine rehabilitation consult.  Review of Systems  Cardiovascular: Positive for palpitations.  Genitourinary: Positive for urgency.  Musculoskeletal: Positive for myalgias and falls.  Neurological: Positive for dizziness.  All other systems reviewed and are negative.  Past Medical History  Diagnosis Date  . Diabetes mellitus without complication   . CHF (congestive heart failure)   . Postherpetic neuralgia   . Dyslipidemia   . Ischemic  heart disease   . Benign positional vertigo   . Shingles   . Myocardial infarction   . Coronary artery disease   . Arthritis   . Cancer    Past Surgical History  Procedure Laterality Date  . Penile prosthesis implant    . Eye surgery    . Joint replacement    . Fracture surgery      foot, pin placement   Family History  Problem Relation Age of Onset  . Stroke Mother   . Diabetes Mother   . Cancer Father   . Diabetes Brother    Social History:  reports that he has never smoked. He has never used smokeless tobacco. He reports that he does not drink alcohol or use illicit drugs. Allergies:  Allergies  Allergen Reactions  . Januvia [Sitagliptin]   . Metformin And Related   . Oxycodone   . Penicillins    Medications Prior to Admission  Medication Sig Dispense Refill  . acetaminophen (TYLENOL) 500 MG tablet Take 500 mg by mouth every 6 (six) hours as needed for mild pain.    [name] Kitchen amLODipine (NORVASC) 5 MG tablet Take 5 mg by mouth daily.    [name] Kitchen aspirin 81 MG tablet Take 81 mg by mouth daily.    . cyanocobalamin (,VITAMIN B-12,) 1000 MCG/ML injection Inject 1,000 mcg into the muscle once.    . insulin lispro (HUMALOG) 100 UNIT/ML injection Inject 2-10 Units into the skin once. SLIDING SCALE: 150-200=2units, 201-250=4units, 251-300=6units, 301-350=8units, >350=10units    . isosorbide mononitrate (IMDUR) 30 MG 24 hr tablet Take 30 mg by mouth daily.    . meclizine (ANTIVERT) 25 MG tablet Take 25 mg by mouth every 6 (six) hours as needed for dizziness.    . nitroGLYCERIN (NITROSTAT) 0.4 MG SL tablet Place 0.4  mg under the tongue every 5 (five) minutes as needed for chest pain.    . rosuvastatin (CRESTOR) 40 MG tablet Take 40 mg by mouth daily.    . sertraline (ZOLOFT) 25 MG tablet Take 25 mg by mouth daily.    . tamsulosin (FLOMAX) 0.4 MG CAPS capsule  Take 0.4 mg by mouth.    . ticagrelor (BRILINTA) 90 MG TABS tablet Take 90 mg by mouth 2 (two) times daily.    . vitamin B-12 (CYANOCOBALAMIN) 1000 MCG tablet Take 1,000 mcg by mouth every 30 (thirty) days.      Home: Home Living Family/patient expects to be discharged to:: Private residence Living Arrangements: Spouse/significant other Available Help at Discharge: Family, Available 24 hours/day Type of Home: House Home Access: Stairs to enter [employer] of Steps: (family present unsure as to how many) Home Layout: One [place]: Radio producer - single point Lives With: Spouse  Functional History: Prior Function Level of Independence: Independent Comments: reports multiple falls and states "they are all running together now" so unable to report accurate amount Functional Status:  Mobility: Bed Mobility Overal bed mobility: Needs Assistance Bed Mobility: Supine to Sit Rolling: Min guard Supine to sit: Mod assist, HOB elevated Sit to supine: Max assist, +2 for physical assistance General bed mobility comments: pt able to progress to EOB and needs cues to place bil LE on the floor Transfers Overall transfer level: Needs assistance Equipment used: 1 person hand held assist Transfers: Sit to/from Stand Sit to Stand: Min assist General transfer comment: allowed extended time at EOB prior to sit<>Stand. BP monitored and assessed. Pt HR unchanged      ADL: ADL Overall ADL's : Needs assistance/impaired Grooming: Oral care, Wash/dry face, Standing, Minimal assistance Grooming Details (indicate cue type and reason): pt requires extended time to complete oral care and incr time to open tooth paste (fine motor). pt applying tooth paste properly in correct sequence Lower Body Bathing: Maximal assistance, Sit to/from stand Lower Body Bathing Details (indicate cue type and reason): pt with smear on incontinence of bowel and unaware. Peri care  performed Functional mobility during ADLs: Minimal assistance (LOB with head turns) General ADL Comments: Pt completed bed mobilty, sink level grooming and brief ambulation. pt asked to scan environment for colors due to glasses missing. pt scanning for objects midline to the R mainly. But with visual assessment pt unable to sustain horizontal R attention . Pt with lob with head turns.   Cognition: Cognition Overall Cognitive Status: Impaired/Different from baseline Arousal/Alertness: Awake/alert Orientation Level: Oriented to person, Oriented to place, Oriented to situation, Disoriented to time Attention: Selective Selective Attention: Appears intact Memory: Impaired Memory Impairment: Decreased recall of new information, Decreased short term memory Decreased Short Term Memory: Verbal basic, Functional basic Awareness: Appears intact Problem Solving: Impaired Problem Solving Impairment: Verbal complex Behaviors: Perseveration Safety/Judgment: Impaired [place] [employer] Scales of Cognitive Functioning: Confused/inappropriate/non-agitated Cognition Arousal/Alertness: Awake/alert Behavior During Therapy: WFL for tasks assessed/performed Overall Cognitive Status: Impaired/Different from baseline Area of Impairment: Orientation, Attention, Memory, Following commands, Safety/judgement, Awareness, [place] level Orientation Level: Disoriented to, Time Current Attention Level: Sustained Memory: Decreased recall of precautions, Decreased short-term memory Following Commands: Follows multi-step commands inconsistently Safety/Judgement: Decreased awareness of safety, Decreased awareness of deficits Awareness: Intellectual Problem Solving: Slow processing, Difficulty sequencing General Comments: Pt reports location at [facility] and reason s/p fall and "problem with my heart". Pt unaware of month and with cueing states "Thanksgiving month" pt demonstrates focused attention with visual  assessment  Blood pressure 144/66, pulse 57, temperature 97.9 F (36.6 C), temperature source Axillary, resp. rate 16, height 5' 10" (1.778 m), weight 93.9 kg (207 lb 0.2 oz), SpO2 98 %. Physical Exam  Constitutional: He is oriented to person, place, and time.  HENT:  Healing abrasion to the right scalp  Eyes: EOM are normal.  Neck: Normal range of motion. Neck supple. No thyromegaly present.  Cardiovascular: Normal rate and regular rhythm.  Respiratory: Effort normal and breath sounds normal. No respiratory distress.  GI: Soft. Bowel sounds are normal. He exhibits no distension.  Neurological: He is alert and oriented to person, place, and time.  Mood is flat but appropriate. He does make good eye contact with examiner. He was able to provide his name, age date of birth. He could not recall full events leading to his hospital admission. Follows commands. Didn't know today's date. Mild right facial droop. [name]. LE: 4/5 proximal to distal. Decreased sensation to PP/LT in both feet  Skin:  Abrasions on right face,scalp. Scattered contusions arms legs.  Psychiatric:  Cooperative. Slightly confused     Lab Results Last 24 Hours    Results for orders placed or performed during the hospital encounter of 10/03/14 (from the past 24 hour(s))  Glucose, capillary Status: Abnormal   Collection Time: 10/05/14 11:47 AM  Result Value Ref Range   Glucose-Capillary 249 (H) 70 - 99 mg/dL  Glucose, capillary Status: Abnormal   Collection Time: 10/05/14 3:36 PM  Result Value Ref Range   Glucose-Capillary 264 (H) 70 - 99 mg/dL  Glucose, capillary Status: Abnormal   Collection Time: 10/05/14 8:03 PM  Result Value Ref Range   Glucose-Capillary 223 (H) 70 - 99 mg/dL   Comment 1 Notify RN    Comment 2 Documented in Chart   Glucose, capillary Status: Abnormal   Collection Time: 10/05/14 11:37 PM  Result Value Ref Range    Glucose-Capillary 211 (H) 70 - 99 mg/dL   Comment 1 Documented in Chart    Comment 2 Notify RN   Glucose, capillary Status: Abnormal   Collection Time: 10/06/14 3:47 AM  Result Value Ref Range   Glucose-Capillary 169 (H) 70 - 99 mg/dL   Comment 1 Documented in Chart    Comment 2 Notify RN   Glucose, capillary Status: Abnormal   Collection Time: 10/06/14 8:39 AM  Result Value Ref Range   Glucose-Capillary 183 (H) 70 - 99 mg/dL   Comment 1 Notify RN    Comment 2 Documented in Chart       Imaging Results (Last 48 hours)    No results found.    Assessment/Plan: Diagnosis: traumatic brain injury after fall. Hx of falss/gait disorder 1. Does the need for close, 24 hr/day medical supervision in concert with the patient's rehab needs make it unreasonable for this patient to be served in a less intensive setting? Yes 2. Co-Morbidities requiring supervision/potential complications: junctional bradycardia 3. Due to bladder management, bowel management, safety, skin/wound care, disease management, medication administration, pain management and patient education, does the patient require 24 hr/day rehab nursing? Yes 4. Does the patient require coordinated care of a physician, rehab nurse, PT (1-2 hrs/day, 5 days/week), OT (1-2 hrs/day, 5 days/week) and SLP (1-2 hrs/day, 5 days/week) to address physical and functional deficits in the context of the above medical diagnosis(es)? Yes Addressing deficits in the following areas: balance, endurance, locomotion, strength, transferring, bowel/bladder control, bathing, dressing, feeding, grooming, toileting, cognition, language, swallowing and psychosocial support 5. Can  the patient actively participate in an intensive therapy program of at least 3 hrs of therapy per day at least 5 days per week? Yes 6. The potential for patient to make measurable gains while on inpatient rehab is  excellent 7. Anticipated functional outcomes upon discharge from inpatient rehab are modified independent and supervision with PT, modified independent and supervision with OT, modified independent and supervision with SLP. 8. Estimated rehab length of stay to reach the above functional goals is: 7-10 days 9. Does the patient have adequate social supports to accommodate these discharge functional goals? Yes 10. Anticipated D/C setting: Home 11. Anticipated post D/C treatments: [place] therapy 12. Overall Rehab/Functional Prognosis: excellent  RECOMMENDATIONS: This patient's condition is appropriate for continued rehabilitative care in the following setting: CIR Patient has agreed to participate in recommended program. Yes Note that insurance prior authorization may be required for reimbursement for recommended care.  Comment: This patient will need to make some long term decision and changes pertaining to safety and balance around home. He's fallen numerous times it appears.   Meredith Staggers, MD, [place] [facility] 10/06/2014     10/06/2014       Revision History     Date/Time User Provider Type Action   10/06/2014 2:48 PM Meredith Staggers, MD Physician Sign   10/06/2014 11:07 AM Cathlyn Parsons, PA-C Physician Assistant [place]   View Details Report       Routing History     Date/Time From To Method   10/06/2014 2:48 PM Meredith Staggers, MD Meredith Staggers, MD In Basket   10/06/2014 2:48 PM Meredith Staggers, MD [name], MD In [place]

## 2014-10-08 ENCOUNTER — Inpatient Hospital Stay (HOSPITAL_COMMUNITY): Payer: Medicare Other | Admitting: Speech Pathology

## 2014-10-08 ENCOUNTER — Inpatient Hospital Stay (HOSPITAL_COMMUNITY): Payer: Self-pay | Admitting: Occupational Therapy

## 2014-10-08 ENCOUNTER — Inpatient Hospital Stay (HOSPITAL_COMMUNITY): Payer: Medicare Other

## 2014-10-08 ENCOUNTER — Inpatient Hospital Stay (HOSPITAL_COMMUNITY): Payer: Self-pay | Admitting: Speech Pathology

## 2014-10-08 LAB — CBC WITH DIFFERENTIAL/PLATELET
BASOS ABS: 0 10*3/uL (ref 0.0–0.1)
Basophils Relative: 0 % (ref 0–1)
Eosinophils Absolute: 0.2 10*3/uL (ref 0.0–0.7)
Eosinophils Relative: 3 % (ref 0–5)
HEMATOCRIT: 43.9 % (ref 39.0–52.0)
Hemoglobin: 15.5 g/dL (ref 13.0–17.0)
LYMPHS PCT: 37 % (ref 12–46)
Lymphs Abs: 2.4 10*3/uL (ref 0.7–4.0)
MCH: 31.2 pg (ref 26.0–34.0)
MCHC: 35.3 g/dL (ref 30.0–36.0)
MCV: 88.3 fL (ref 78.0–100.0)
Monocytes Absolute: 0.6 10*3/uL (ref 0.1–1.0)
Monocytes Relative: 10 % (ref 3–12)
NEUTROS ABS: 3.2 10*3/uL (ref 1.7–7.7)
NEUTROS PCT: 50 % (ref 43–77)
Platelets: 132 10*3/uL — ABNORMAL LOW (ref 150–400)
RBC: 4.97 MIL/uL (ref 4.22–5.81)
RDW: 12.7 % (ref 11.5–15.5)
WBC: 6.5 10*3/uL (ref 4.0–10.5)

## 2014-10-08 LAB — COMPREHENSIVE METABOLIC PANEL
ALBUMIN: 2.8 g/dL — AB (ref 3.5–5.2)
ALT: 9 U/L (ref 0–53)
ANION GAP: 16 — AB (ref 5–15)
AST: 15 U/L (ref 0–37)
Alkaline Phosphatase: 36 U/L — ABNORMAL LOW (ref 39–117)
BUN: 19 mg/dL (ref 6–23)
CO2: 21 mEq/L (ref 19–32)
CREATININE: 1.44 mg/dL — AB (ref 0.50–1.35)
Calcium: 8.9 mg/dL (ref 8.4–10.5)
Chloride: 103 mEq/L (ref 96–112)
GFR calc Af Amer: 51 mL/min — ABNORMAL LOW (ref >90)
GFR calc non Af Amer: 44 mL/min — ABNORMAL LOW (ref >90)
Glucose, Bld: 161 mg/dL — ABNORMAL HIGH (ref 70–99)
POTASSIUM: 3.9 meq/L (ref 3.7–5.3)
Sodium: 140 mEq/L (ref 137–147)
TOTAL PROTEIN: 6.2 g/dL (ref 6.0–8.3)
Total Bilirubin: 0.9 mg/dL (ref 0.3–1.2)

## 2014-10-08 LAB — GLUCOSE, CAPILLARY
GLUCOSE-CAPILLARY: 308 mg/dL — AB (ref 70–99)
GLUCOSE-CAPILLARY: 234 mg/dL — AB (ref 70–99)
Glucose-Capillary: 152 mg/dL — ABNORMAL HIGH (ref 70–99)
Glucose-Capillary: 151 mg/dL — ABNORMAL HIGH (ref 70–99)
Glucose-Capillary: 317 mg/dL — ABNORMAL HIGH (ref 70–99)

## 2014-10-08 MED ORDER — INSULIN GLARGINE 100 UNIT/ML ~~LOC~~ SOLN
10.0000 [IU] | Freq: Every day | SUBCUTANEOUS | Status: DC
  Administered 2014-10-08: 10 [IU] via SUBCUTANEOUS
  Filled 2014-10-08 (×2): qty 0.1

## 2014-10-08 MED ORDER — QUETIAPINE FUMARATE 25 MG PO TABS
25.0000 mg | ORAL_TABLET | Freq: Four times a day (QID) | ORAL | Status: DC | PRN
  Filled 2014-10-08: qty 1

## 2014-10-08 MED ORDER — INSULIN ASPART 100 UNIT/ML ~~LOC~~ SOLN
0.0000 [IU] | Freq: Three times a day (TID) | SUBCUTANEOUS | Status: DC
  Administered 2014-10-08 (×2): 11 [IU] via SUBCUTANEOUS
  Administered 2014-10-09: 10 [IU] via SUBCUTANEOUS
  Administered 2014-10-09: 5 [IU] via SUBCUTANEOUS
  Administered 2014-10-09: 10 [IU] via SUBCUTANEOUS
  Administered 2014-10-10: 8 [IU] via SUBCUTANEOUS
  Administered 2014-10-10: 3 [IU] via SUBCUTANEOUS
  Administered 2014-10-10: 11 [IU] via SUBCUTANEOUS
  Administered 2014-10-11: 8 [IU] via SUBCUTANEOUS
  Administered 2014-10-11: 11 [IU] via SUBCUTANEOUS
  Administered 2014-10-11: 3 [IU] via SUBCUTANEOUS
  Administered 2014-10-12: 8 [IU] via SUBCUTANEOUS
  Administered 2014-10-12: 11 [IU] via SUBCUTANEOUS
  Administered 2014-10-13 – 2014-10-14 (×4): 8 [IU] via SUBCUTANEOUS
  Administered 2014-10-14: 2 [IU] via SUBCUTANEOUS
  Administered 2014-10-14: 8 [IU] via SUBCUTANEOUS
  Administered 2014-10-15: 3 [IU] via SUBCUTANEOUS
  Administered 2014-10-15: 2 [IU] via SUBCUTANEOUS
  Administered 2014-10-15: 8 [IU] via SUBCUTANEOUS
  Administered 2014-10-16: 11 [IU] via SUBCUTANEOUS
  Administered 2014-10-16: 8 [IU] via SUBCUTANEOUS

## 2014-10-08 MED ORDER — INSULIN ASPART 100 UNIT/ML ~~LOC~~ SOLN
0.0000 [IU] | Freq: Every day | SUBCUTANEOUS | Status: DC
  Administered 2014-10-08: 2 [IU] via SUBCUTANEOUS
  Administered 2014-10-09: 5 [IU] via SUBCUTANEOUS
  Administered 2014-10-11: 2 [IU] via SUBCUTANEOUS
  Administered 2014-10-12: 4 [IU] via SUBCUTANEOUS
  Administered 2014-10-13: 5 [IU] via SUBCUTANEOUS
  Administered 2014-10-14 – 2014-10-15 (×2): 2 [IU] via SUBCUTANEOUS

## 2014-10-08 NOTE — Progress Notes (Signed)
Physical Therapy Session Note  Patient Details  Name: Frank Simpson MRN: [id number] Date of Birth: [birth year]  Today's Date: 10/08/2014 PT Individual Time: 1630-1700 PT Individual Time Calculation (min): 30 min   Short Term Goals: Week 1:  PT Short Term Goal 1 (Week 1): Pt to perform bed mobility with supervision in a standard bed PT Short Term Goal 2 (Week 1): Pt to perform transfers with LRAD and supervision, 50% of time PT Short Term Goal 3 (Week 1): Pt to ambulate 300' with LRAD and supervision, 50% of time PT Short Term Goal 4 (Week 1): Pt to negotiate up/down 3 steps with single rail and supervision, 50% of time  Skilled Therapeutic Interventions/Progress Updates:  1:1. Pt received supine in bed lightly sleeping, easy to wake. Pt req min cues to recall events earlier in day. Focus this session on functional transfers and ambulation as well as formal assessment of balance. Pt req min A for t/f sup>sit EOB with use of bed rail, min A-close(S) for all transfers sit<>stand as well as ambulation 175'x2 with R HHA. Pt req intermittent cues throughout session to not pull/push with L UE.   Berg Balance Test performed, pt scored 29/56 points, see objective details below. Pt educated on results of test, indicating that pt currently is at high risk for fall. Education provided regarding importance of AD and need for assistance during mobility at this time. Pt verbalized understanding.   Pt left sitting in recliner at end of session w/ all needs in reach and quick release belt in place. RN aware.    Therapy Documentation Precautions:  Precautions Precautions: Fall, ICD/Pacemaker Precaution Comments: Pacemaker precautions Required Braces or Orthoses: Sling Other Brace/Splint: sling for L UE, light WB  Restrictions Weight Bearing Restrictions: Yes Other Position/Activity Restrictions: light WB through L UE due to pacemaker placement Pain: Pain Assessment Pain Assessment: No/denies  pain  Balance: Balance Balance Assessed: Yes Standardized Balance Assessment Standardized Balance Assessment: Berg Balance Test Berg Balance Test Sit to Stand: Able to stand  independently using hands Standing Unsupported: Able to stand 2 minutes with supervision Sitting with Back Unsupported but Feet Supported on Floor or Stool: Able to sit safely and securely 2 minutes Stand to Sit: Sits safely with minimal use of hands Transfers: Able to transfer with verbal cueing and /or supervision Standing Unsupported with Eyes Closed: Able to stand 10 seconds with supervision Standing Ubsupported with Feet Together: Able to place feet together independently and stand for 1 minute with supervision From Standing, Reach Forward with Outstretched Arm: Reaches forward but needs supervision From Standing Position, Pick up Object from Floor: Able to pick up shoe, needs supervision From Standing Position, Turn to Look Behind Over each Shoulder: Turn sideways only but maintains balance Turn 360 Degrees: Needs assistance while turning Standing Unsupported, Alternately Place Feet on Step/Stool: Needs assistance to keep from falling or unable to try Standing Unsupported, One Foot in Front: Needs help to step but can hold 15 seconds Standing on One Leg: Unable to try or needs assist to prevent fall Total Score: 29  See FIM for current functional status  Therapy/Group: Individual Therapy  Gilmore Laroche 10/08/2014, 5:02 PM

## 2014-10-08 NOTE — Evaluation (Signed)
Occupational Therapy Assessment and Plan  Patient Details  Name: Frank Simpson MRN: [id number] Date of Birth: 04/07/1931  OT Diagnosis: cognitive deficits and muscle weakness (generalized) Rehab Potential: Rehab Potential (ACUTE ONLY): Good ELOS: 10-12 days   Today's Date: 10/08/2014 OT Individual Time: 1100-1200 OT Individual Time Calculation (min): 60 min     Problem List:  Patient Active Problem List   Diagnosis Date Noted  . Gait disorder 10/07/2014  . Fall   . Traumatic brain injury with loss of consciousness of 31 minutes to 59 minutes 10/03/2014  . Syncope 10/03/2014  . Junctional bradycardia 10/03/2014  . Benign positional vertigo 09/25/2014  . Medicare annual wellness visit, initial 07/09/2014  . Mood disorder 07/09/2014  . B12 deficiency 06/11/2014  . Fall 04/23/2014  . Diabetes mellitus type 2 with retinopathy 02/19/2014  . Rash of face 01/03/2014  . Shoulder injury 07/10/2013  . Right knee pain 07/10/2013  . Memory loss 04/16/2013  . Syncope 04/02/2013  . Post herpetic neuralgia 02/26/2013  . Unstable angina 10/15/2012  . CAD (coronary artery disease) 10/15/2012  . Acute on chronic systolic CHF (congestive heart failure) 10/15/2012  . Benign hypertension with CKD (chronic kidney disease) stage III 10/15/2012  . Thrombocytopenia   . STEMI (ST elevation myocardial infarction) 08/06/2012  . Osteoarthritis   . CKD stage 3 due to type 2 diabetes mellitus   . Urinary incontinence   . Type 2 diabetes, uncontrolled, with neuropathy 07/18/2011  . Hypercholesterolemia 07/18/2011    Past Medical History:  Past Medical History  Diagnosis Date  . Hypertension   . Diabetes mellitus type 2 with retinopathy 1994    dm edu 2012  . Hyperlipidemia   . Osteoarthritis   . CRI (chronic renal insufficiency)     baseline Cr seems to be 1.7-1.8  . Urinary incontinence     s/p PTNS didn't help  . Glaucoma     and cataracts  . History of melanoma   . CAD (coronary  artery disease)     07/2012 acute STEMI, mid LAD PCI - DES; cath 09/2012 patent LAD stent, non-hemodynamically significant Left Main/LAD disease, EF 55%  . Thrombocytopenia   . BCC (basal cell carcinoma of skin)     L neck (Dr. Sherrye Payor)  . Complication of anesthesia     confused after cath 10/15/2012  . CVA (cerebral infarction) 09/2012    remote anterior limb of left internal capsule  . Post herpetic neuralgia   . Shingles in March 2014    right chest, across the back  . Syncope 04/02/2013  . Blurred vision   . Basal cell carcinoma 2015    L forearm ([facility])  . Dysplastic nevus 2015    L upper back, Lat margin involved ([facility])  . Diabetes mellitus without complication   . CHF (congestive heart failure)   . Postherpetic neuralgia   . Dyslipidemia   . Ischemic heart disease   . Benign positional vertigo   . Shingles   . Myocardial infarction   . Coronary artery disease   . Arthritis   . Cancer    Past Surgical History:  Past Surgical History  Procedure Laterality Date  . Replacement total knee  04/2010    RIGHT KNEE  . Tonsillectomy    . Cardiovascular stress test  04/27/2010    EF 75%, nuclear stress test with normal perfusion, no ischemia  . Foot surgery      metal pin in place  . Right shoulder    .  Finger surgery      amputated finger  . Lexiscan myoview  10/2011    negative for ischemia  . Cataract extraction  12/12, 1/13    bilateral  . Coronary stent placement  07/2012    DES to mid LAD for STEMI  . Cardiac catheterization  10/15/2012  . Carotid US  03/2013    WNL  . US echocardiography  03/2013    inf/septal hypokinesis, mild LVH, EF 45%, LA mildly dilated  . Penile prosthesis implant    . Eye surgery    . Joint replacement    . Fracture surgery      foot, pin placement    Assessment & Plan Clinical Impression: Patient is a 79 y.o. year old male male with history of diabetes mellitus and peripheral neuropathy, diastolic congestive heart failure and  coronary artery disease/ MI maintained on Brilinta and a baby aspirin prior to admission.admitted 10/03/2014 with history of frequent falls.Patient independent with a cane prior to admission living with his wife. He does drive short distances. Most recently he was working in the yard and felt dizzy and fell striking his head on a tractor with loss of consciousness.He was intubated in the ED. Cranial CT scan showed atrophy with small vessel disease no acute intracranial abnormalities. There was a right frontal scalp hematoma. CT of cervical spine as well as abdomen and pelvis unremarkable. Follow-up cardiology services for bouts of bradycardia with hypotension with recurrent episodes of intermittent complete heart block while on the monitor.Underwent placement of pacemaker 10/06/2014 per Dr. Osie Cheeks. Echocardiogram with ejection fraction of 40% grade 1 diastolic dysfunction. Patient remains off Brilinta per cardiology services.Hospital course patient was extubated 10/04/2014. Patient transferred to CIR on 10/07/2014 .    Patient currently requires min to mod assist with basic self-care skills and basic mobility  secondary to muscle weakness, decreased cardiorespiratoy endurance, decreased coordination, decreased attention, decreased awareness, decreased problem solving, decreased safety awareness and decreased memory and decreased sitting balance, decreased standing balance, decreased balance strategies and difficulty maintaining precautions.  Prior to hospitalization, patient could complete ADL with modified independent .  Patient will benefit from skilled intervention to decrease level of assist with basic self-care skills and increase independence with basic self-care skills prior to discharge home with care partner.  Anticipate patient will require intermittent supervision and follow up outpatient.  OT - End of Session Endurance Deficit: Yes Endurance Deficit Description: lethergy increased OT  Assessment Rehab Potential (ACUTE ONLY): Good OT Patient demonstrates impairments in the following area(s): Balance;Behavior;Cognition;Endurance;Motor;Pain;Perception;Safety;Skin Integrity OT Basic ADL's Functional Problem(s): Eating;Grooming;Bathing;Dressing;Toileting OT Transfers Functional Problem(s): Toilet;Tub/Shower OT Additional Impairment(s): None OT Plan OT Intensity: Minimum of 1-2 x/day, 45 to 90 minutes OT Frequency: 5 out of 7 days OT Duration/Estimated Length of Stay: 10-12 days OT Treatment/Interventions: Balance/vestibular training;Cognitive remediation/compensation;Community reintegration;Discharge planning;DME/adaptive equipment instruction;Neuromuscular re-education;Patient/family education;Self Care/advanced ADL retraining;Splinting/orthotics;Therapeutic Exercise;UE/LE Coordination activities;Visual/perceptual remediation/compensation;UE/LE Strength taining/ROM;Therapeutic Activities;Psychosocial support;Pain management;Functional mobility training OT Self Feeding Anticipated Outcome(s): I  OT Basic Self-Care Anticipated Outcome(s): supervision OT Toileting Anticipated Outcome(s): supervision OT Bathroom Transfers Anticipated Outcome(s): mod I  OT Recommendation Recommendations for Other Services: Neuropsych consult Patient destination: Home Follow Up Recommendations: Outpatient OT Equipment Recommended: To be determined   Skilled Therapeutic Intervention 1:1 OT eval initiated with Ot goals, purpose and role discussed with pt and wife. Self care retraining at shower level with focus on functional ambulation with steady A, standing endurance, safety awareness, dynamic sitting and standing balance, functional problem solving, orientation, intellectual awareness with mod  cuing, recall of earlier activity with mod A, orientation. Pt bathed sit to stand in shower with steady A and cuing to wash all parts with pacemaker site covered. Pt with decr attention to balance sitting EOB  while therapist assisted with donning socks - falling backwards with decr awareness due to fatigue and inattention. Pt required mod to max cuing to maintain left Ue precautions post pacemaker. Assisted pt in shaving at sink due to being left handed and required A to complete task thoroughly. Pt left in w/c at end of session in prep for lunch with wife present.   OT Evaluation Precautions/Restrictions  Precautions Precautions: Fall;ICD/Pacemaker Precaution Comments: Pacemaker precautions Other Brace/Splint: sling for L UE, light WB  Restrictions Weight Bearing Restrictions: Yes Other Position/Activity Restrictions: light WB through L UE due to pacemaker placement General Chart Reviewed: Yes Family/Caregiver Present: Yes (wife) Vital Signs   Pain Pain Assessment Pain Assessment: No/denies pain Home Living/Prior Functioning Home Living Available Help at Discharge: Family, Available 24 hours/day Type of Home: House Home Access: Stairs to enter Technical brewer of Steps: 3  Entrance Stairs-Rails: Left, Right Home Layout: One level  Lives With: Spouse Prior Function Level of Independence: Independent with basic ADLs, Independent with gait, Independent with transfers, Independent with homemaking with ambulation, Requires assistive device for independence  Able to Take Stairs?: Yes Driving: Yes (Short distances) Vocation: Retired Comments: Hx of multiple falls, req assistance from wife to get up ADL ADL ADL Comments: see FIM Vision/Perception  Vision- History Baseline Vision/History: Wears glasses Wears Glasses: At all times Patient Visual Report: No change from baseline Vision- Assessment Vision Assessment?: Yes Eye Alignment: Within Functional Limits Ocular Range of Motion: Within Functional Limits Tracking/Visual Pursuits: Decreased smoothness of horizontal tracking;Requires cues, head turns, or add eye shifts to track;Unable to hold eye position out of midline;Impaired  - to be further tested in functional context  Cognition Overall Cognitive Status: Impaired/Different from baseline Arousal/Alertness: Awake/alert Orientation Level: Oriented to person;Oriented to situation;Disoriented to time;Disoriented to place Attention: Sustained;Selective;Focused Focused Attention: Appears intact Sustained Attention: Appears intact Selective Attention: Impaired Selective Attention Impairment: Verbal basic;Functional basic Memory: Impaired Memory Impairment: Decreased recall of new information;Decreased short term memory Decreased Short Term Memory: Verbal basic;Functional basic Awareness: Impaired Awareness Impairment: Intellectual impairment Problem Solving: Impaired Problem Solving Impairment: Verbal basic;Functional basic Behaviors: Lability Safety/Judgment: Impaired [place] [employer] Scales of Cognitive Functioning: Confused/inappropriate/non-agitated (emergent VI) Sensation Sensation Light Touch: Appears Intact Stereognosis: Appears Intact Hot/Cold: Appears Intact Proprioception: Appears Intact Coordination Gross Motor Movements are Fluid and Coordinated: Yes Fine Motor Movements are Fluid and Coordinated: Yes Motor  Motor Motor - Skilled Clinical Observations: generalized weakness Mobility  Bed Mobility Bed Mobility: Supine to Sit Supine to Sit: 4: Min assist Transfers Transfers: Sit to Stand;Stand to Sit Sit to Stand: 4: Min assist Stand to Sit: 4: Min assist  Trunk/Postural Assessment  Cervical Assessment Cervical Assessment: Within Functional Limits Thoracic Assessment Thoracic Assessment: Within Functional Limits Lumbar Assessment Lumbar Assessment: Within Functional Limits Postural Control Postural Control: Deficits on evaluation Righting Reactions: delayed Protective Responses: delayed  Balance Balance Balance Assessed: Yes Static Sitting Balance Static Sitting - Balance Support: Feet supported;No upper extremity  supported Static Sitting - Level of Assistance: 5: Stand by assistance Dynamic Sitting Balance Dynamic Sitting - Balance Support: During functional activity Dynamic Sitting - Level of Assistance: 4: Min assist Static Standing Balance Static Standing - Balance Support: During functional activity Static Standing - Level of Assistance: 4: Min assist Dynamic Standing Balance Dynamic Standing -  Balance Support: During functional activity Dynamic Standing - Level of Assistance: 4: Min assist;3: Mod assist Extremity/Trunk Assessment RUE Assessment RUE Assessment: Within Functional Limits LUE Assessment LUE Assessment: Within Functional Limits  FIM:  FIM - Grooming Grooming Steps: Wash, rinse, dry face;Wash, rinse, dry hands;Shave or apply make-up;Brush, comb hair Grooming: 3: Patient completes 2 of 4 or 3 of 5 steps FIM - Bathing Bathing Steps Patient Completed: Chest;Right Arm;Left Arm;Abdomen;Front perineal area;Buttocks;Right upper leg;Left upper leg;Right lower leg (including foot);Left lower leg (including foot) Bathing: 4: Steadying assist FIM - Upper Body Dressing/Undressing Upper body dressing/undressing steps patient completed: Thread/unthread right sleeve of pullover shirt/dresss;Pull shirt over trunk Upper body dressing/undressing: 3: Mod-Patient completed 50-74% of tasks FIM - Lower Body Dressing/Undressing Lower body dressing/undressing steps patient completed: Thread/unthread right underwear leg;Thread/unthread left underwear leg;Pull underwear up/down;Thread/unthread right pants leg;Thread/unthread left pants leg;Pull pants up/down Lower body dressing/undressing: 3: Mod-Patient completed 50-74% of tasks FIM - Control and instrumentation engineer Devices: Arm rests;Bed rails;HOB elevated Bed/Chair Transfer: 4: Supine > Sit: Min A (steadying Pt. > 75%/lift 1 leg);4: Bed > Chair or W/C: Min A (steadying Pt. > 75%);4: Chair or W/C > Bed: Min A (steadying Pt. > 75%) FIM  - Radio producer Devices: Grab bars Toilet Transfers: 4-To toilet/BSC: Min A (steadying Pt. > 75%);4-From toilet/BSC: Min A (steadying Pt. > 75%) FIM - Systems developer Devices: Shower chair;Grab bars Tub/shower Transfers: 4-Into Tub/Shower: Min A (steadying Pt. > 75%/lift 1 leg);4-Out of Tub/Shower: Min A (steadying Pt. > 75%/lift 1 leg)   Refer to Care Plan for Long Term Goals  Recommendations for other services: Neuropsych  Discharge Criteria: Patient will be discharged from OT if patient refuses treatment 3 consecutive times without medical reason, if treatment goals not met, if there is a change in medical status, if patient makes no progress towards goals or if patient is discharged from hospital.  The above assessment, treatment plan, treatment alternatives and goals were discussed and mutually agreed upon: by patient and by family  Nicoletta Ba 10/08/2014, 1:51 PM

## 2014-10-08 NOTE — Evaluation (Signed)
Speech Language Pathology Assessment and Plan  Patient Details  Name: Frank Simpson MRN: [id number] Date of Birth: 26-Feb-1931  SLP Diagnosis: Cognitive Impairments  Rehab Potential: Excellent ELOS: 10-12 days   Today's Date: 10/08/2014 SLP Individual Time: 5009-3818 SLP Individual Time Calculation (min): 60 min   Problem List:  Patient Active Problem List   Diagnosis Date Noted  . Gait disorder 10/07/2014  . Fall   . Traumatic brain injury with loss of consciousness of 31 minutes to 59 minutes 10/03/2014  . Syncope 10/03/2014  . Junctional bradycardia 10/03/2014  . Benign positional vertigo 09/25/2014  . Medicare annual wellness visit, initial 07/09/2014  . Mood disorder 07/09/2014  . B12 deficiency 06/11/2014  . Fall 04/23/2014  . Diabetes mellitus type 2 with retinopathy 02/19/2014  . Rash of face 01/03/2014  . Shoulder injury 07/10/2013  . Right knee pain 07/10/2013  . Memory loss 04/16/2013  . Syncope 04/02/2013  . Post herpetic neuralgia 02/26/2013  . Unstable angina 10/15/2012  . CAD (coronary artery disease) 10/15/2012  . Acute on chronic systolic CHF (congestive heart failure) 10/15/2012  . Benign hypertension with CKD (chronic kidney disease) stage III 10/15/2012  . Thrombocytopenia   . STEMI (ST elevation myocardial infarction) 08/06/2012  . Osteoarthritis   . CKD stage 3 due to type 2 diabetes mellitus   . Urinary incontinence   . Type 2 diabetes, uncontrolled, with neuropathy 07/18/2011  . Hypercholesterolemia 07/18/2011   Past Medical History:  Past Medical History  Diagnosis Date  . Hypertension   . Diabetes mellitus type 2 with retinopathy 1994    dm edu 2012  . Hyperlipidemia   . Osteoarthritis   . CRI (chronic renal insufficiency)     baseline Cr seems to be 1.7-1.8  . Urinary incontinence     s/p PTNS didn't help  . Glaucoma     and cataracts  . History of melanoma   . CAD (coronary artery disease)     07/2012 acute STEMI, mid LAD PCI  - DES; cath 09/2012 patent LAD stent, non-hemodynamically significant Left Main/LAD disease, EF 55%  . Thrombocytopenia   . BCC (basal cell carcinoma of skin)     L neck (Dr. Sherrye Payor)  . Complication of anesthesia     confused after cath 10/15/2012  . CVA (cerebral infarction) 09/2012    remote anterior limb of left internal capsule  . Post herpetic neuralgia   . Shingles in March 2014    right chest, across the back  . Syncope 04/02/2013  . Blurred vision   . Basal cell carcinoma 2015    L forearm ([facility])  . Dysplastic nevus 2015    L upper back, Lat margin involved ([facility])  . Diabetes mellitus without complication   . CHF (congestive heart failure)   . Postherpetic neuralgia   . Dyslipidemia   . Ischemic heart disease   . Benign positional vertigo   . Shingles   . Myocardial infarction   . Coronary artery disease   . Arthritis   . Cancer    Past Surgical History:  Past Surgical History  Procedure Laterality Date  . Replacement total knee  04/2010    RIGHT KNEE  . Tonsillectomy    . Cardiovascular stress test  04/27/2010    EF 75%, nuclear stress test with normal perfusion, no ischemia  . Foot surgery      metal pin in place  . Right shoulder    . Finger surgery  amputated finger  . Lexiscan myoview  10/2011    negative for ischemia  . Cataract extraction  12/12, 1/13    bilateral  . Coronary stent placement  07/2012    DES to mid LAD for STEMI  . Cardiac catheterization  10/15/2012  . Carotid US  03/2013    WNL  . US echocardiography  03/2013    inf/septal hypokinesis, mild LVH, EF 45%, LA mildly dilated  . Penile prosthesis implant    . Eye surgery    . Joint replacement    . Fracture surgery      foot, pin placement    Assessment / Plan / Recommendation Clinical Impression Patient is an 80 y.o.right handed male with history of diabetes mellitus and peripheral neuropathy, diastolic congestive heart failure and coronary artery disease/MI,  maintained on Brilinta and a baby aspirin prior to admission. Patient was admitted 10/03/2014 with history of frequent falls. Patient independent with a cane prior to admission living with his wife. He does drive short distances. Most recently he was working in the yard and felt dizzy and fell striking his head on a tractor with loss of consciousness. He was intubated in the ED. Cranial CT scan showed atrophy with small vessel disease and no acute intracranial abnormalities. There was a right frontal scalp hematoma. CT of cervical spine as well as abdomen and pelvis unremarkable. Follow-up cardiology services for bouts of bradycardia with hypotension with recurrent episodes of intermittent complete heart block while on the monitor. Underwent placement of pacemaker 10/06/2014 per Dr. Osie Cheeks. Echocardiogram with ejection fraction of 48% grade 1 diastolic dysfunction. Patient remains off Brilinta per cardiology services. Patient was extubated 10/04/2014. Physical and occupational therapy evaluations completed. Recommendations of physical medicine rehabilitation consult. Patient was admitted for comprehensive rehabilitation program on 10/07/14 and demonstrates behaviors consistent with a Rancho level V characterized by decreased orientation, selective attention, intellectual awareness, functional problem solving, working memory and safety awareness which impacts his ability to perform basic and familiar tasks safely. Patient's wife reports he has baseline memory impairments and can be verbose and labile at times since heart attack ~2 years ago. Patient would benefit from skilled SLP intervention to maximize his cognitive function and overall functional independence prior to discharge home with wife. Anticipate patient will require 24 hour supervision and f/u home health SLP services.   Skilled Therapeutic Interventions          Administered a cognitive-linguistic evaluation. Please see above for details.  Educated the patient and his wife in regards to current cognitive-linguistic deficits and goals of skilled SLP intervention. Both verbalized understanding.  SLP Assessment  Patient will need skilled [facility] [facility] during CIR admission    Recommendations  Oral Care Recommendations: Oral care BID Recommendations for Other Services: Neuropsych consult Patient destination: Home Follow up Recommendations: 24 hour supervision/assistance;Home Health SLP Equipment Recommended: None recommended by SLP    SLP Frequency 5 out of 7 days   SLP Treatment/Interventions Cognitive remediation/compensation;Cueing hierarchy;Functional tasks;Environmental controls;Internal/external aids;Patient/family education;Therapeutic Activities    Pain Pain Assessment Pain Assessment: No/denies pain Prior Functioning Type of Home: House  Lives With: Spouse Available Help at Discharge: Family;Available 24 hours/day Vocation: Retired  Industrial/product designer Term Goals: Week 1: SLP Short Term Goal 1 (Week 1): Patient will orient to time, place and situation with supervision multimodal cues.  SLP Short Term Goal 2 (Week 1): Patient will identify 1 physical and 1 cognitive impairment with Mod A multimodal cues.  SLP Short Term Goal 3 (  Week 1): Patient will utilize the call bell to request assistance with Max A multimodal cues.  SLP Short Term Goal 4 (Week 1): Patient will utilize schedule to recall new, daily information with Mod A multimodal cues.  SLP Short Term Goal 5 (Week 1): Patient will demonstrate selective attention to a task in a mildly distracting enviornment with for 30 minutes with Min A multimodal cues.  SLP Short Term Goal 6 (Week 1): Patient will demonstrate functional problem solving for basic and familiar tasks with Max A multimodal cues.   See FIM for current functional status Refer to Care Plan for Long Term Goals  Recommendations for other services: Neuropsych  Discharge Criteria: Patient  will be discharged from SLP if patient refuses treatment 3 consecutive times without medical reason, if treatment goals not met, if there is a change in medical status, if patient makes no progress towards goals or if patient is discharged from hospital.  The above assessment, treatment plan, treatment alternatives and goals were discussed and mutually agreed upon: by patient and by family  Jory Tanguma, [place] 10/08/2014, 9:26 AM

## 2014-10-08 NOTE — Progress Notes (Signed)
Patient information reviewed and entered into eRehab system by Daiva Nakayama, RN, CRRN, [place] Coordinator.  Information including medical coding and functional independence measure will be reviewed and updated through discharge.     Per nursing patient was given "Data Collection Information Summary for Patients in Inpatient Rehabilitation Facilities with attached "Privacy Act [place] Records" upon admission.

## 2014-10-08 NOTE — Progress Notes (Signed)
Speech Language Pathology Daily Session Note  Patient Details  Name: Frank Simpson MRN: [id number] Date of Birth: 09/18/1931  Today's Date: 10/08/2014 SLP Individual Time: 1400-1500 SLP Individual Time Calculation (min): 60 min  Short Term Goals: Week 1: SLP Short Term Goal 1 (Week 1): Patient will orient to time, place and situation with supervision multimodal cues.  SLP Short Term Goal 2 (Week 1): Patient will identify 1 physical and 1 cognitive impairment with Mod A multimodal cues.  SLP Short Term Goal 3 (Week 1): Patient will utilize the call bell to request assistance with Max A multimodal cues.  SLP Short Term Goal 4 (Week 1): Patient will utilize schedule to recall new, daily information with Mod A multimodal cues.  SLP Short Term Goal 5 (Week 1): Patient will demonstrate selective attention to a task in a mildly distracting enviornment with for 30 minutes with Min A multimodal cues.  SLP Short Term Goal 6 (Week 1): Patient will demonstrate functional problem solving for basic and familiar tasks with Max A multimodal cues.   Skilled Therapeutic Interventions:  Pt was seen for skilled speech therapy targeting ongoing diagnostic treatment of cognitive function.  SLP assessed reading comprehension and written expression with both formal and informal measures.  Pt presented with mild-moderate impairments of both reading and writing at the phrase level which is largely cognitively based in decreased sustained attention, decreased working memory, and decreased error awareness.  Pt also reports limited baseline reading and writing function and has no formal education past 8th grade.  Pt's sister was present for the duration of today's therapy session and was unable to clearly verify pt's baseline function.  SLP also completed skilled observations with presentations of pt's currently prescribed diet during a functional snack with pt exhibiting adequate oral manipulation and clearance of solid  consistencies, no overt s/s of aspiration with solid or liquid consistencies. Continue per current plan of care.   FIM:  Comprehension Comprehension Mode: Auditory Comprehension: 4-Understands basic 75 - 89% of the time/requires cueing 10 - 24% of the time Expression Expression Mode: Verbal Expression: 5-Expresses basic 90% of the time/requires cueing < 10% of the time. Social Interaction Social Interaction: 4-Interacts appropriately 75 - 89% of the time - Needs redirection for appropriate language or to initiate interaction. Problem Solving Problem Solving: 1-Solves basic less than 25% of the time - needs direction nearly all the time or does not effectively solve problems and may need a restraint for safety Memory Memory: 2-Recognizes or recalls 25 - 49% of the time/requires cueing 51 - 75% of the time FIM - Eating Eating Activity: 5: Supervision/cues (with functional snack )  Pain Pain Assessment Pain Assessment: No/denies pain  Therapy/Group: Individual Therapy   Frank Simpson, M.A. CCC-SLP  Frank Simpson, Frank Simpson 10/08/2014, 4:23 PM

## 2014-10-08 NOTE — Evaluation (Addendum)
Physical Therapy Assessment and Plan  Patient Details  Name: Frank Simpson MRN: [id number] Date of Birth: 15-Apr-1931  PT Diagnosis: Abnormality of gait, Cognitive deficits and Muscle weakness; Decreased balance; Decreased functional endurance Rehab Potential: Good ELOS: 10-12days   Today's Date: 10/08/2014 PT Individual Time: 0900-1000 PT Individual Time Calculation (min): 60 min    Problem List:  Patient Active Problem List   Diagnosis Date Noted  . Gait disorder 10/07/2014  . Fall   . Traumatic brain injury with loss of consciousness of 31 minutes to 59 minutes 10/03/2014  . Syncope 10/03/2014  . Junctional bradycardia 10/03/2014  . Benign positional vertigo 09/25/2014  . Medicare annual wellness visit, initial 07/09/2014  . Mood disorder 07/09/2014  . B12 deficiency 06/11/2014  . Fall 04/23/2014  . Diabetes mellitus type 2 with retinopathy 02/19/2014  . Rash of face 01/03/2014  . Shoulder injury 07/10/2013  . Right knee pain 07/10/2013  . Memory loss 04/16/2013  . Syncope 04/02/2013  . Post herpetic neuralgia 02/26/2013  . Unstable angina 10/15/2012  . CAD (coronary artery disease) 10/15/2012  . Acute on chronic systolic CHF (congestive heart failure) 10/15/2012  . Benign hypertension with CKD (chronic kidney disease) stage III 10/15/2012  . Thrombocytopenia   . STEMI (ST elevation myocardial infarction) 08/06/2012  . Osteoarthritis   . CKD stage 3 due to type 2 diabetes mellitus   . Urinary incontinence   . Type 2 diabetes, uncontrolled, with neuropathy 07/18/2011  . Hypercholesterolemia 07/18/2011    Past Medical History:  Past Medical History  Diagnosis Date  . Hypertension   . Diabetes mellitus type 2 with retinopathy 1994    dm edu 2012  . Hyperlipidemia   . Osteoarthritis   . CRI (chronic renal insufficiency)     baseline Cr seems to be 1.7-1.8  . Urinary incontinence     s/p PTNS didn't help  . Glaucoma     and cataracts  . History of melanoma    . CAD (coronary artery disease)     07/2012 acute STEMI, mid LAD PCI - DES; cath 09/2012 patent LAD stent, non-hemodynamically significant Left Main/LAD disease, EF 55%  . Thrombocytopenia   . BCC (basal cell carcinoma of skin)     L neck (Dr. Sherrye Payor)  . Complication of anesthesia     confused after cath 10/15/2012  . CVA (cerebral infarction) 09/2012    remote anterior limb of left internal capsule  . Post herpetic neuralgia   . Shingles in March 2014    right chest, across the back  . Syncope 04/02/2013  . Blurred vision   . Basal cell carcinoma 2015    L forearm ([facility])  . Dysplastic nevus 2015    L upper back, Lat margin involved ([facility])  . Diabetes mellitus without complication   . CHF (congestive heart failure)   . Postherpetic neuralgia   . Dyslipidemia   . Ischemic heart disease   . Benign positional vertigo   . Shingles   . Myocardial infarction   . Coronary artery disease   . Arthritis   . Cancer    Past Surgical History:  Past Surgical History  Procedure Laterality Date  . Replacement total knee  04/2010    RIGHT KNEE  . Tonsillectomy    . Cardiovascular stress test  04/27/2010    EF 75%, nuclear stress test with normal perfusion, no ischemia  . Foot surgery      metal pin in place  . Right shoulder    .  Finger surgery      amputated finger  . Lexiscan myoview  10/2011    negative for ischemia  . Cataract extraction  12/12, 1/13    bilateral  . Coronary stent placement  07/2012    DES to mid LAD for STEMI  . Cardiac catheterization  10/15/2012  . Carotid US  03/2013    WNL  . US echocardiography  03/2013    inf/septal hypokinesis, mild LVH, EF 45%, LA mildly dilated  . Penile prosthesis implant    . Eye surgery    . Joint replacement    . Fracture surgery      foot, pin placement    Assessment & Plan Clinical Impression: [name] is a 80 y.o.right handed male with history of diabetes mellitus and peripheral neuropathy, diastolic  congestive heart failure and coronary artery disease/ MI maintained on Brilinta and a baby aspirin prior to admission.admitted 10/03/2014 with history of frequent falls.Patient independent with a cane prior to admission living with his wife. He does drive short distances. Most recently he was working in the yard and felt dizzy and fell striking his head on a tractor with loss of consciousness.He was intubated in the ED. Cranial CT scan showed atrophy with small vessel disease no acute intracranial abnormalities. There was a right frontal scalp hematoma.CT of cervical spine as well as abdomen and pelvis unremarkable. Follow-up cardiology services for bouts of bradycardia with hypotension with recurrent episodes of intermittent complete heart block while on the monitor.Underwent placement of pacemaker 10/06/2014 per Dr. Osie Cheeks. Echocardiogram with ejection fraction of 22% grade 1 diastolic dysfunction. Patient remains off Brilinta per cardiology services.Hospital course patient was extubated 10/04/2014. Physical and occupational therapy evaluations completed. Recommendations of physical medicine rehabilitation consult. Patient was admitted for comprehensive rehabilitation program  Patient transferred to CIR on 10/07/2014 .   Patient currently requires min with mobility secondary to muscle weakness, decreased functional endurance, decreased attention, decreased awareness, decreased problem solving, decreased safety awareness and decreased memory and decreased sitting balance, decreased standing balance, decreased balance strategies and difficulty maintaining precautions.  Prior to hospitalization, patient was modified independent  with mobility and lived with Spouse in a House home.  Home access is 3 Stairs to enter.  Patient will benefit from skilled PT intervention to maximize safe functional mobility, minimize fall risk and decrease caregiver burden for planned discharge home with 24 hour supervision.   Anticipate patient will benefit from follow up [place] at discharge.  PT - End of Session Activity Tolerance: Tolerates 30+ min activity with multiple rests;Tolerates 10 - 20 min activity with multiple rests Endurance Deficit: Yes Endurance Deficit Description: lethergy increased PT Assessment Rehab Potential (ACUTE/IP ONLY): Good PT Patient demonstrates impairments in the following area(s): Balance;Endurance;Safety;Skin Integrity;Other (comment) (Strength, Cognition) PT Transfers Functional Problem(s): Bed to Chair;Car;Bed Mobility;Floor;Furniture PT Locomotion Functional Problem(s): Ambulation;Stairs PT Plan PT Intensity: Minimum of 1-2 x/day ,45 to 90 minutes PT Frequency: 5 out of 7 days PT Duration Estimated Length of Stay: 10-12days PT Treatment/Interventions: Ambulation/gait training;Community reintegration;DME/adaptive equipment instruction;Psychosocial support;Stair training;UE/LE Strength taining/ROM;UE/LE Coordination activities;Therapeutic Activities;Skin care/wound management;Discharge planning;Balance/vestibular training;Cognitive remediation/compensation;Disease management/prevention;Functional mobility training;Patient/family education;Therapeutic Exercise;Neuromuscular re-education;Pain management PT Transfers Anticipated Outcome(s): Supervision PT Locomotion Anticipated Outcome(s): Supervision PT Recommendation Follow Up Recommendations: Home health PT Patient destination: Home Equipment Recommended: To be determined  Skilled Therapeutic Intervention 1:1. Pt received semi-reclined in bed, ready for therapy with wife at side. PT evaluation performed, see detailed objective information below. Pt and wife educated on rehab environment, role of  therapies, goals for physical therapy and general safety plan. Both verbalized understanding. Tx initiated with emphasis on safety during functional transfers, ambulation and stair negotiation. Pt left semi-reclined in bed at end of session w/  all needs in reach and bed alarm on.   PT Evaluation Precautions/Restrictions Precautions Precautions: Fall;ICD/Pacemaker Precaution Comments: multiple falls at home  Required Braces or Orthoses: Sling Other Brace/Splint: sling for L UE, light WB  Restrictions Weight Bearing Restrictions: Yes Other Position/Activity Restrictions: light WB through L UE due to pacemaker placement General Chart Reviewed: Yes Family/Caregiver Present: Yes Vital Signs  Pain   Home Living/Prior Functioning Home Living Available Help at Discharge: Family;Available 24 hours/day Type of Home: House Home Access: Stairs to enter [employer] of Steps: 3  Entrance Stairs-Rails: Left;Right (Cannot reach both) Home Layout: One level  Lives With: Spouse Prior Function Level of Independence: Independent with basic ADLs;Independent with gait;Independent with transfers;Independent with homemaking with ambulation;Requires assistive device for independence  Able to Take Stairs?: Yes Driving: Yes (Short distances) Vocation: Retired Comments: Hx of multiple falls, req assistance from wife to get up Vision/Perception  See OT eval Cognition Overall Cognitive Status: Impaired/Different from baseline Arousal/Alertness: Awake/alert Orientation Level: Oriented to person;Oriented to situation;Disoriented to time;Disoriented to place Attention: Sustained;Selective;Focused Focused Attention: Appears intact Sustained Attention: Appears intact Selective Attention: Impaired Selective Attention Impairment: Verbal basic;Functional basic Memory: Impaired Memory Impairment: Decreased recall of new information;Decreased short term memory Decreased Short Term Memory: Verbal basic;Functional basic Awareness: Impaired Awareness Impairment: Intellectual impairment Problem Solving: Impaired Problem Solving Impairment: Verbal basic;Functional basic Executive Function:  (all impaired due to lower level  deficits) Behaviors: Lability (at baseline) Safety/Judgment: Impaired [employer] Scales of Cognitive Functioning: Confused/inappropriate/non-agitated Sensation Sensation Light Touch: Appears Intact Stereognosis: Appears Intact Hot/Cold: Appears Intact Proprioception: Appears Intact Coordination Gross Motor Movements are Fluid and Coordinated: Yes Fine Motor Movements are Fluid and Coordinated: Yes Motor  Motor Motor: Other (comment) Motor - Skilled Clinical Observations: generalized weakness  Mobility Bed Mobility Bed Mobility: Supine to Sit;Sit to Supine Supine to Sit: 4: Min assist Supine to Sit Details: Verbal cues for precautions/safety Sit to Supine: 4: Min assist Sit to Supine - Details: Verbal cues for precautions/safety Transfers Transfers: Yes Sit to Stand: 4: Min assist Sit to Stand Details: Manual facilitation for weight shifting;Verbal cues for precautions/safety Stand to Sit: 4: Min assist Stand to Sit Details (indicate cue type and reason): Verbal cues for precautions/safety Stand Pivot Transfers: 4: Min assist Stand Pivot Transfer Details: Verbal cues for precautions/safety Locomotion  Ambulation Ambulation: Yes Ambulation/Gait Assistance: 4: Min assist Ambulation Distance (Feet): 200 Feet Assistive device: 1 person hand held assist Ambulation/Gait Assistance Details: Verbal cues for precautions/safety;Verbal cues for gait pattern Gait Gait: Yes Gait Pattern: Impaired Gait Pattern: Narrow base of support;Trunk flexed Stairs / Additional Locomotion Stairs: Yes Stairs Assistance: 4: Min assist Stairs Assistance Details: Verbal cues for precautions/safety Stair Management Technique: One rail Right;Step to pattern;Forwards Number of Stairs: 5 Architect: Yes Wheelchair Assistance: 4: Advertising account executive Details: Verbal cues for technique;Verbal cues for Astronomer: Both lower  extermities;Right upper extremity Wheelchair Parts Management: Supervision/cueing Distance: 100'  Trunk/Postural Assessment  Cervical Assessment Cervical Assessment: Within Functional Limits Thoracic Assessment Thoracic Assessment: Within Functional Limits Lumbar Assessment Lumbar Assessment: Within Functional Limits Postural Control Postural Control: Deficits on evaluation Righting Reactions: delayed Protective Responses: delayed  Balance Balance Balance Assessed: Yes Static Sitting Balance Static Sitting - Balance Support: Feet supported;No upper extremity supported Static Sitting -  Level of Assistance: 5: Stand by assistance Dynamic Sitting Balance Dynamic Sitting - Balance Support: During functional activity;Feet supported;Left upper extremity supported;Right upper extremity supported Dynamic Sitting - Level of Assistance: 4: Min assist Dynamic Sitting - Balance Activities: Lateral lean/weight shifting;Forward lean/weight shifting;Reaching for objects Static Standing Balance Static Standing - Balance Support: During functional activity;Right upper extremity supported Static Standing - Level of Assistance: 4: Min assist Dynamic Standing Balance Dynamic Standing - Balance Support: During functional activity;Right upper extremity supported Dynamic Standing - Level of Assistance: 4: Min assist;3: Mod assist Dynamic Standing - Balance Activities: Lateral lean/weight shifting;Forward lean/weight shifting;Reaching for objects;Reaching across midline Extremity Assessment  RUE Assessment RUE Assessment: Within Functional Limits LUE Assessment LUE Assessment: Within Functional Limits RLE Assessment RLE Assessment: Exceptions to [facility] RLE AROM (degrees) RLE Overall AROM Comments: limited knee extension due to knee replacement RLE Strength RLE Overall Strength Comments: 4-/5. Pt reports decreased strength in this leg at baseline LLE Assessment LLE Assessment: Within Functional Limits  (Grossly 4+/5)  FIM:  FIM - Bed/Chair Transfer Bed/Chair Transfer Assistive Devices: Arm rests;Bed rails;HOB elevated Bed/Chair Transfer: 4: Supine > Sit: Min A (steadying Pt. > 75%/lift 1 leg);4: Sit > Supine: Min A (steadying pt. > 75%/lift 1 leg);4: Bed > Chair or W/C: Min A (steadying Pt. > 75%);4: Chair or W/C > Bed: Min A (steadying Pt. > 75%) FIM - Locomotion: Wheelchair Locomotion: Wheelchair: 2: Travels 50 - 149 ft with minimal assistance (Pt.>75%) FIM - Locomotion: Ambulation Locomotion: Ambulation Assistive Devices: Other (comment) (R HHA) Ambulation/Gait Assistance: 4: Min assist Locomotion: Ambulation: 4: Travels 150 ft or more with minimal assistance (Pt.>75%) FIM - Locomotion: Stairs Locomotion: Scientist, physiological: Hand rail - 1 Locomotion: Stairs: 2: Up and Down 4 - 11 stairs with minimal assistance (Pt.>75%)   Refer to Care Plan for Long Term Goals  Recommendations for other services: None  Discharge Criteria: Patient will be discharged from PT if patient refuses treatment 3 consecutive times without medical reason, if treatment goals not met, if there is a change in medical status, if patient makes no progress towards goals or if patient is discharged from hospital.  The above assessment, treatment plan, treatment alternatives and goals were discussed and mutually agreed upon: by patient and by family  Gilmore Laroche 10/08/2014, 12:59 PM

## 2014-10-08 NOTE — Progress Notes (Signed)
[facility] PHYSICAL MEDICINE & REHABILITATION     PROGRESS NOTE    Subjective/Complaints: Very agitated this morning. Upset that he's got the waist belt. paranoid  Objective: Vital Signs: Blood pressure 131/66, pulse 78, temperature 98.7 F (37.1 C), temperature source Oral, resp. rate 18, weight 89.8 kg (197 lb 15.6 oz), SpO2 94 %. Dg Chest Port 1 View  10/06/2014   CLINICAL DATA:  Postprocedural radiograph.  Pacemaker placement.  EXAM: PORTABLE CHEST - 1 VIEW  COMPARISON:  10/03/2014.  FINDINGS: Two lead LEFT subclavian pacemaker is present. There is no pneumothorax. Aortic arch atherosclerosis. Pacemaker leads are positioned over the RIGHT atrium and RIGHT ventricle. Lung volumes are low. No pulmonary edema. No airspace consolidation.  IMPRESSION: New dual lead LEFT subclavian cardiac pacemaker.  No pneumothorax.   Electronically Signed   By: Dereck Ligas M.D.   On: 10/06/2014 19:33    Recent Labs  10/08/14 0544  WBC 6.5  HGB 15.5  HCT 43.9  PLT 132*    Recent Labs  10/08/14 0544  NA 140  K 3.9  CL 103  GLUCOSE 161*  BUN 19  CREATININE 1.44*  CALCIUM 8.9   CBG (last 3)   Recent Labs  10/07/14 2353 10/08/14 0446 10/08/14 0725  GLUCAP 170* 152* 151*    Wt Readings from Last 3 Encounters:  10/07/14 89.8 kg (197 lb 15.6 oz)  10/03/14 93.9 kg (207 lb 0.2 oz)  09/25/14 93.169 kg (205 lb 6.4 oz)    Physical Exam:  Constitutional: He is oriented to person, place, and time. Sitting in chair with WB in place. HENT: tongue dry/split in center Healing abrasion to the right scalp  Eyes: EOM are normal.  Neck: Normal range of motion. Neck supple. No thyromegaly present.  Cardiovascular: Normal rate and regular rhythm. Pacemaker site clean and dry with steristrips Respiratory: Effort normal and breath sounds normal. No respiratory distress.  GI: Soft. Bowel sounds are normal. He exhibits no distension.  Neurological: He is alert and oriented to person, place,  and time.  Mood remains flat but appropriate. Confused but non-agtiated. He does make good eye contact with examiner. He was able to provide his name, age date of birth. Knew he was at [place] some of events leading to admissions. Follows commands. Didn't know today's date. Mild right facial droop. [name]. LE: 4/5 proximal to distal. Decreased sensation to PP/LT in both feet to ankles.  Skin:  Abrasions on right face,scalp. Scattered bruises/lacs on arms legs.  Psychiatric:  Agitated, paranoid. "you're sending me to my grave"   Assessment/Plan: 1. Functional deficits secondary to TBI which require 3+ hours per day of interdisciplinary therapy in a comprehensive inpatient rehab setting. Physiatrist is providing close team supervision and 24 hour management of active medical problems listed below. Physiatrist and rehab team continue to assess barriers to discharge/monitor patient progress toward functional and medical goals. FIM:                                  Medical Problem List and Plan: 1. Functional deficits secondary to Traumatic brain injury/scalp hematoma after a fall with history of gait disorder 2. DVT Prophylaxis/Anticoagulation: SCDs.   3. Pain Management: hydrocodone as needed. Monitor with increased mobility 4. Symptomatic bradycardia with complete heart block. Status post pacemaker placement 10/06/2014 per Dr. Crissie Sickles.Aspirin has been resumed.Brilinta not resumed per cardiology services 5. Neuropsych: This patient is not  capable of making decisions on his own behalf.  -very irritable. Add prn seroquel for severe agitation,paranoia 6. Skin/Wound Care: routine skin checks monitoring of scalp abrasion 7. Fluids/Electrolytes/Nutrition: strict I&O's. Follow-up chemistries. Provide nutritional supplements 8.Hypertension. Norvasc 5 mg daily,, Imdur 30 mg daily.Monitor with increased mobility 9.Diabetes mellitus with peripheral neuropathy.  Check blood sugars before meals and at bedtime. Continue sliding scale insulin for now. 10.BPH. Check PVR 3. Patient on Flomax 0.4 mg daily prior to admission and will resume 11.Mood/depression.Patient on Zoloft 25 mg daily prior to admission. Provide emotional support as appropriate 12.Hyperlipidemia. Crestor LOS (Days) 1 A FACE TO FACE EVALUATION WAS PERFORMED  Frank Simpson T 10/08/2014 8:23 AM

## 2014-10-09 ENCOUNTER — Inpatient Hospital Stay (HOSPITAL_COMMUNITY): Payer: Self-pay | Admitting: Physical Therapy

## 2014-10-09 ENCOUNTER — Inpatient Hospital Stay (HOSPITAL_COMMUNITY): Payer: Medicare Other

## 2014-10-09 ENCOUNTER — Inpatient Hospital Stay (HOSPITAL_COMMUNITY): Payer: Medicare Other | Admitting: Speech Pathology

## 2014-10-09 ENCOUNTER — Encounter (HOSPITAL_COMMUNITY): Payer: Self-pay

## 2014-10-09 ENCOUNTER — Inpatient Hospital Stay (HOSPITAL_COMMUNITY): Payer: Self-pay

## 2014-10-09 LAB — GLUCOSE, CAPILLARY
GLUCOSE-CAPILLARY: 210 mg/dL — AB (ref 70–99)
GLUCOSE-CAPILLARY: 425 mg/dL — AB (ref 70–99)
GLUCOSE-CAPILLARY: 370 mg/dL — AB (ref 70–99)
GLUCOSE-CAPILLARY: 285 mg/dL — AB (ref 70–99)
Glucose-Capillary: 363 mg/dL — ABNORMAL HIGH (ref 70–99)
Glucose-Capillary: 325 mg/dL — ABNORMAL HIGH (ref 70–99)
Glucose-Capillary: 384 mg/dL — ABNORMAL HIGH (ref 70–99)

## 2014-10-09 MED ORDER — INSULIN GLARGINE 100 UNIT/ML ~~LOC~~ SOLN
25.0000 [IU] | Freq: Every day | SUBCUTANEOUS | Status: DC
  Administered 2014-10-09 – 2014-10-11 (×3): 25 [IU] via SUBCUTANEOUS
  Filled 2014-10-09 (×4): qty 0.25

## 2014-10-09 NOTE — Progress Notes (Signed)
Results for KRISS, ISHLER (MRN [id number]) as of 10/09/2014 13:02  Ref. Range 10/09/2014 12:13 10/09/2014 12:49  Glucose-Capillary Latest Range: 70-99 mg/dL 425 (H) 370 (H)    CBG taken at 1213 with a result of 425. The patient's nurse notified Silvestre Mesi, PA and ordered to give 10 units of Novolog insulin. Obtained follow up CBG at 1249 with a result of 370. Notified Dan, PA and no further orders were given.   Will continue to monitor.  Earlie Lou

## 2014-10-09 NOTE — Plan of Care (Signed)
Problem: RH BOWEL ELIMINATION Goal: RH STG MANAGE BOWEL WITH ASSISTANCE STG Manage Bowel with minimal Assistance.  Outcome: Progressing  Problem: RH BLADDER ELIMINATION Goal: RH STG MANAGE BLADDER WITH ASSISTANCE STG Manage Bladder With minimal Assistance  Outcome: Progressing  Problem: RH SKIN INTEGRITY Goal: RH STG SKIN FREE OF INFECTION/BREAKDOWN Remain free from infection and skin breakdown with minimal assist  Outcome: Progressing  Problem: RH SAFETY Goal: RH STG ADHERE TO SAFETY PRECAUTIONS W/ASSISTANCE/DEVICE STG Adhere to Safety Precautions With \Mod I Assistance/Device.  Outcome: Progressing

## 2014-10-09 NOTE — Progress Notes (Signed)
At 1600 patient's blood glucose 363. Notified Dan A, PA given order to administer 10 units and recheck 1 hour after administration. Patient asymptomatic. Will continue to monitor patient.

## 2014-10-09 NOTE — Progress Notes (Signed)
Occupational Therapy Session Note  Patient Details  Name: Frank Simpson MRN: [id number] Date of Birth: [birth year]  Today's Date: 10/09/2014 OT Individual Time: 0700-0800 OT Individual Time Calculation (min): 60 min    Short Term Goals: Week 1:  OT Short Term Goal 1 (Week 1): Pt consistently oriented x4 with environmental cues with supervision OT Short Term Goal 2 (Week 1): Pt will perform toileting with supervision OT Short Term Goal 3 (Week 1): Pt will obtain his own clothing with min cuing  OT Short Term Goal 4 (Week 1): Pt will stand to perform 3/3 grooming tasks with supervision   Skilled Therapeutic Interventions/Progress Updates:    Pt engaged in BADL retraining including bathing at shower level and dressing with sit<>stand from EOB.  Pt amb with HHA (min A) to bathroom to use toilet prior to transferring to shower.  Pt completed bathing with sit<>stand from shower seat.  Pt required min verbal cues to adhere to pacemaker precautions with LUE.  Pt required min verbal cues for safety awareness when in shower.  Pt amb back into room and completed dressing from EOB.  Pt required steady A with sit<>stand and standing to pull up pants.  Pt completed grooming tasks standing at sink with steady A for balance.  Pt transitioned to therapy gym and then engaged in pathfinding task to locate room.  Pt required min verbal cues to locate room. Pt was able to recall reason for hospitalization and stated he didn't remember ambulance ride because he was unconscious.  Focus on activity tolerance, functional amb with HHA, sit<>stand, standing balance, orientation, and safety awareness.  Therapy Documentation Precautions:  Precautions Precautions: Fall, ICD/Pacemaker Precaution Comments: Pacemaker precautions Required Braces or Orthoses: Sling Other Brace/Splint: sling for L UE, light WB  Restrictions Weight Bearing Restrictions: Yes Other Position/Activity Restrictions: light WB through L UE due to  pacemaker placement Pain: Pain Assessment Pain Assessment: No/denies pain ADL: ADL ADL Comments: see FIM  See FIM for current functional status  Therapy/Group: Individual Therapy  [name] 10/09/2014, 8:00 AM

## 2014-10-09 NOTE — Progress Notes (Signed)
Speech Language Pathology Daily Session Note  Patient Details  Name: Frank Simpson MRN: [id number] Date of Birth: 10/18/1931  Today's Date: 10/09/2014 SLP Individual Time: 6237-6283 SLP Individual Time Calculation (min): 30 min  Short Term Goals: Week 1: SLP Short Term Goal 1 (Week 1): Patient will orient to time, place and situation with supervision multimodal cues.  SLP Short Term Goal 2 (Week 1): Patient will identify 1 physical and 1 cognitive impairment with Mod A multimodal cues.  SLP Short Term Goal 3 (Week 1): Patient will utilize the call bell to request assistance with Max A multimodal cues.  SLP Short Term Goal 4 (Week 1): Patient will utilize schedule to recall new, daily information with Mod A multimodal cues.  SLP Short Term Goal 5 (Week 1): Patient will demonstrate selective attention to a task in a mildly distracting enviornment with for 30 minutes with Min A multimodal cues.  SLP Short Term Goal 6 (Week 1): Patient will demonstrate functional problem solving for basic and familiar tasks with Max A multimodal cues.   Skilled Therapeutic Interventions: Skilled treatment session focused on addressing cognitive recovery.  SLP facilitated session with basic money management task and Max assist verbal, visual and instructional cues to utilize working memory strategies.  Patient also required Max multimodal cues to self-monitor and correct calculation errors while making change.  Session ended with Total assist cues to stop patient from removing quick release belt and attempting to get in bed and instead utilize call bell to request help.  Continue with current plan of care.    FIM:  Comprehension Comprehension Mode: Auditory Comprehension: 3-Understands basic 50 - 74% of the time/requires cueing 25 - 50%  of the time Expression Expression Mode: Verbal Expression: 4-Expresses basic 75 - 89% of the time/requires cueing 10 - 24% of the time. Needs helper to occlude trach/needs to  repeat words. Social Interaction Social Interaction: 4-Interacts appropriately 75 - 89% of the time - Needs redirection for appropriate language or to initiate interaction. Problem Solving Problem Solving: 2-Solves basic 25 - 49% of the time - needs direction more than half the time to initiate, plan or complete simple activities Memory Memory: 2-Recognizes or recalls 25 - 49% of the time/requires cueing 51 - 75% of the time  Pain Pain Assessment Pain Assessment: No/denies pain  Therapy/Group: Individual Therapy  Carmelia Roller., CCC-SLP [phone]  [place] 10/09/2014, 5:07 PM

## 2014-10-09 NOTE — Progress Notes (Signed)
Physical Therapy Session Note  Patient Details  Name: Frank Simpson MRN: [id number] Date of Birth: 1931-03-30  Today's Date: 10/09/2014 PT Individual Time: 9:00 - 10:00 and  1300-1400  PT Individual Time Calculation (min): 60 min and 60 minutes  Short Term Goals: Week 1:  PT Short Term Goal 1 (Week 1): Pt to perform bed mobility with supervision in a standard bed PT Short Term Goal 2 (Week 1): Pt to perform transfers with LRAD and supervision, 50% of time PT Short Term Goal 3 (Week 1): Pt to ambulate 300' with LRAD and supervision, 50% of time PT Short Term Goal 4 (Week 1): Pt to negotiate up/down 3 steps with single rail and supervision, 50% of time  Skilled Therapeutic Interventions/Progress Updates:  AM Session: Patient sitting in recliner upon entering room with wife present. Patient sit to stand with min assist and ambulated with single point cane 200 feet x 2 with occasional steady assist in controlled environment level tile surfaces. Patient ambulated up and down 6 steps 6.5 inches high to simulate home environment using 1 rail with min steady assist and occasional verbal cueing for technique. Patient asked to get into simulated car and patient performed by putting left LE in first which requires single leg stance on right LE. Patient required min assist due to loss of balance.  Patient instructed to go in bottom first and patient performed with close supervision to minimal steady assist. Patient worked on multiple sit to stands from sofa, love seat, and regular bed with emphasis to use right UE to push up instead of cane during sit <> stand. Patient able to perform with close supervision when he remembered correct/safe technique which was ~50% of the time. Patient left in recliner with quick release belt in place, all items in reach, and wife present in room.   PM Session: Patient sitting at nurses station in wheelchair with quick release belt in place. Patient demonstrated carry over  of using right UE for sit to stand 75% of the time during this session. Patient ambulated 300 feet outside on unlevel and incline surfaces using cane and occasional steady assist for balance. Patient worked on ambulating around and over obstacles, varying speeds, head turns, sideways and backwards walking with cane and occasional steady assist for balance. Patient worked on [employer] for LE strength and endurance for 5 minutes on workload 5 using only LE's. Patient performed [place] exercises for LE to improve balance for 1 set of 10 reps with UE support in standing. Patient left in recliner with quick release belt in place and all items in reach.    Therapy Documentation Precautions:  Precautions Precautions: Fall, ICD/Pacemaker Precaution Comments: Pacemaker precautions Required Braces or Orthoses: Sling Other Brace/Splint: sling for L UE, light WB  Restrictions Weight Bearing Restrictions: Yes Other Position/Activity Restrictions: light WB through L UE due to pacemaker placement  Pain: Pain Assessment Pain Assessment: No/denies pain  Locomotion : Ambulation Ambulation/Gait Assistance: 4: Min guard   See FIM for current functional status  Therapy/Group: Individual Therapy  Elder Love M 10/09/2014, 3:27 PM

## 2014-10-09 NOTE — Progress Notes (Signed)
PHYSICAL MEDICINE & REHABILITATION     PROGRESS NOTE    Subjective/Complaints: Less agitated today. Slept ok. Doesn't like being belted into chair or bed.  Objective: Vital Signs: Blood pressure 129/72, pulse 74, temperature 98.6 F (37 C), temperature source Oral, resp. rate 18, weight 89.676 kg (197 lb 11.2 oz), SpO2 94 %. No results found.  Recent Labs  10/08/14 0544  WBC 6.5  HGB 15.5  HCT 43.9  PLT 132*    Recent Labs  10/08/14 0544  NA 140  K 3.9  CL 103  GLUCOSE 161*  BUN 19  CREATININE 1.44*  CALCIUM 8.9   CBG (last 3)   Recent Labs  10/08/14 1605 10/08/14 2046 10/09/14 0654  GLUCAP 308* 234* 210*    Wt Readings from Last 3 Encounters:  10/08/14 89.676 kg (197 lb 11.2 oz)  10/03/14 93.9 kg (207 lb 0.2 oz)  09/25/14 93.169 kg (205 lb 6.4 oz)    Physical Exam:  Constitutional: He is oriented to person, place, and time. Sitting in chair with WB in place. HENT: tongue dry/split in center Healing abrasion to the right scalp stable Eyes: EOM are normal.  Neck: Normal range of motion. Neck supple. No thyromegaly present.  Cardiovascular: Normal rate and regular rhythm. Pacemaker site clean and dry with steristrips Respiratory: Effort normal and breath sounds normal. No respiratory distress.  GI: Soft. Bowel sounds are normal. He exhibits no distension.  Neurological: He is alert and oriented to person, place, and time.  Confused, slightly irritable. [name] Kitchen He was able to provide his name, age date of birth. Knew he was at [facility] some of events leading to admissions. Follows commands. Didn't know today's date. Mild right facial droop. [name]. LE: 4/5 proximal to distal. Decreased sensation to PP/LT in both feet to ankles.  Skin:  Abrasions on right face,scalp. Scattered bruises/lacs on arms legs.  Psychiatric:  Less agitated today  Assessment/Plan: 1. Functional deficits secondary to TBI which require 3+ hours per day  of interdisciplinary therapy in a comprehensive inpatient rehab setting. Physiatrist is providing close team supervision and 24 hour management of active medical problems listed below. Physiatrist and rehab team continue to assess barriers to discharge/monitor patient progress toward functional and medical goals. FIM: FIM - Bathing Bathing Steps Patient Completed: Chest, Right Arm, Left Arm, Abdomen, Front perineal area, Buttocks, Right upper leg, Left upper leg, Right lower leg (including foot), Left lower leg (including foot) Bathing: 4: Steadying assist  FIM - Upper Body Dressing/Undressing Upper body dressing/undressing steps patient completed: Thread/unthread right sleeve of pullover shirt/dresss, Pull shirt over trunk, Put head through opening of pull over shirt/dress, Thread/unthread right sleeve of front closure shirt/dress Upper body dressing/undressing: 5: Supervision: Safety issues/verbal cues FIM - Lower Body Dressing/Undressing Lower body dressing/undressing steps patient completed: Thread/unthread right underwear leg, Thread/unthread left underwear leg, Pull underwear up/down, Thread/unthread right pants leg, Don/Doff right sock, Fasten/unfasten pants, Pull pants up/down, Thread/unthread left pants leg, Don/Doff left sock, Don/Doff right shoe, Don/Doff left shoe, Fasten/unfasten right shoe, Fasten/unfasten left shoe Lower body dressing/undressing: 4: Steadying Assist  FIM - Toileting Toileting steps completed by patient: Performs perineal hygiene Toileting: 2: Max-Patient completed 1 of 3 steps  FIM - Radio producer Devices: Grab bars Toilet Transfers: 4-To toilet/BSC: Min A (steadying Pt. > 75%), 4-From toilet/BSC: Min A (steadying Pt. > 75%)  FIM - Bed/Chair Transfer Bed/Chair Transfer Assistive Devices: Arm rests, Bed rails, HOB elevated Bed/Chair Transfer: 5: Supine >  Sit: Supervision (verbal cues/safety issues), 4: Bed > Chair or W/C: Min A  (steadying Pt. > 75%), 4: Chair or W/C > Bed: Min A (steadying Pt. > 75%)  FIM - Locomotion: Wheelchair Distance: 100' Locomotion: Wheelchair: 2: Travels 50 - 149 ft with minimal assistance (Pt.>75%) FIM - Locomotion: Ambulation Locomotion: Ambulation Assistive Devices: Other (comment) (R HHA) Ambulation/Gait Assistance: 4: Min assist Locomotion: Ambulation: 4: Travels 150 ft or more with minimal assistance (Pt.>75%)  Comprehension Comprehension Mode: Auditory Comprehension: 4-Understands basic 75 - 89% of the time/requires cueing 10 - 24% of the time  Expression Expression Mode: Verbal Expression: 4-Expresses basic 75 - 89% of the time/requires cueing 10 - 24% of the time. Needs helper to occlude trach/needs to repeat words.  Social Interaction Social Interaction: 3-Interacts appropriately 50 - 74% of the time - May be physically or verbally inappropriate.  Problem Solving Problem Solving: 2-Solves basic 25 - 49% of the time - needs direction more than half the time to initiate, plan or complete simple activities  Memory Memory: 1-Recognizes or recalls less than 25% of the time/requires cueing greater than 75% of the time  Medical Problem List and Plan: 1. Functional deficits secondary to Traumatic brain injury/scalp hematoma after a fall with history of gait disorder 2. DVT Prophylaxis/Anticoagulation: SCDs.   3. Pain Management: hydrocodone as needed. Monitor with increased mobility 4. Symptomatic bradycardia with complete heart block. Status post pacemaker placement 10/06/2014 per Dr. Crissie Sickles.Aspirin has been resumed.Brilinta not resumed per cardiology services 5. Neuropsych: This patient is not capable of making decisions on his own behalf.  -prn seroquel for agitation/paranoia  -limit restraints as possible 6. Skin/Wound Care: routine skin checks monitoring of scalp abrasion 7. Fluids/Electrolytes/Nutrition: strict I&O's. Follow-up chemistries. Provide nutritional  supplements 8.Hypertension. Norvasc 5 mg daily,, Imdur 30 mg daily.Monitor with increased mobility 9.Diabetes mellitus with peripheral neuropathy. Sugars uncontrolled  -increase lantus to 25units qhs (home dose)  -pt was also on scheduled mealtime insulin at home (won't add just yet) 10.BPH. pvr's low.   Flomax 0.4 mg daily   11.Mood/depression.Patient on Zoloft 25 mg daily prior to admission. Provide emotional support as appropriate 12.Hyperlipidemia. Crestor LOS (Days) 2 A FACE TO FACE EVALUATION WAS PERFORMED  SWARTZ,ZACHARY T 10/09/2014 8:08 AM

## 2014-10-09 NOTE — Progress Notes (Signed)
Occupational Therapy Note  Patient Details  Name: Frank Simpson MRN: [id number] Date of Birth: 1931-11-17  Today's Date: 10/09/2014 OT Individual Time: 1100-1200 OT Individual Time Calculation (min): 60 min   Pt denied pain Individual therapy  Pt resting in recliner with QRB in place.  Wife initially present but departed shortly after beginning of session.  Discussed with wife DME needs/recommendations and bathroom setup.  Pt's wife stated that she felt like tub would work best with appropriate DME. Pt amb with SPC (steady A) to ADL apartment and practiced tub bench transfers with steady A.  Pt practiced bed mobility on elevated surface to simulate home environment.  Pt amb with SPC back to room using appropriate signs (with min verbal cues) to locate room.  Pt returned to recliner with QRB in place and all needs within reach.  Focus on discharge planning, transfers, functional amb with RW, and safety awareness.   [name] [facility] 10/09/2014, 1:46 PM

## 2014-10-10 ENCOUNTER — Inpatient Hospital Stay (HOSPITAL_COMMUNITY): Payer: Self-pay | Admitting: Speech Pathology

## 2014-10-10 ENCOUNTER — Inpatient Hospital Stay (HOSPITAL_COMMUNITY): Payer: Self-pay

## 2014-10-10 ENCOUNTER — Inpatient Hospital Stay (HOSPITAL_COMMUNITY): Payer: Medicare Other | Admitting: *Deleted

## 2014-10-10 ENCOUNTER — Encounter (HOSPITAL_COMMUNITY): Payer: Self-pay

## 2014-10-10 LAB — GLUCOSE, CAPILLARY
GLUCOSE-CAPILLARY: 163 mg/dL — AB (ref 70–99)
GLUCOSE-CAPILLARY: 289 mg/dL — AB (ref 70–99)
Glucose-Capillary: 304 mg/dL — ABNORMAL HIGH (ref 70–99)
Glucose-Capillary: 199 mg/dL — ABNORMAL HIGH (ref 70–99)

## 2014-10-10 MED ORDER — QUETIAPINE FUMARATE 50 MG PO TABS
50.0000 mg | ORAL_TABLET | Freq: Every day | ORAL | Status: DC
  Administered 2014-10-10 – 2014-10-16 (×7): 50 mg via ORAL
  Filled 2014-10-10 (×8): qty 1

## 2014-10-10 NOTE — Progress Notes (Signed)
Occupational Therapy Note  Patient Details  Name: Frank Simpson MRN: [id number] Date of Birth: Jun 08, 1931    Pt missed 60 mins skilled OT services.  Pt agitated and refusing to get OOB.  Pt not oriented to place or situation.  Pt thought he was at home.  Attempted to engage pt X 3 unsuccessfully.  Pt's wife present during last attempt and attempted to persuade pt unsuccessfully.    [name] [facility] 10/10/2014, 7:58 AM

## 2014-10-10 NOTE — Progress Notes (Signed)
Occupational Therapy Note  Patient Details  Name: Frank Simpson MRN: [id number] Date of Birth: 1931-08-09  Today's Date: 10/10/2014 OT Individual Time: 1300-1400 OT Individual Time Calculation (min): 60 min   Pt denied pain Individual Therapy  Pt resting in bed with wife present upon arrival.  Pt agreeable to participating in therapy this afternoon.  Pt amb with SPC to therapy gym.  Pt issued a RW to use for remainder of session.  Pt amb with RW to tub room and practiced 1)tub bench transfer and 2)stepping over edge of tub to sit on chair.  Discussed with wife and patient that a recommendation would be made in a few days as patient continues to progress.  Pt amb with RW to kitchen to practice kitchen and RW safety.  Pt demonstrated safe RW strategies when transporting items.  Pt required max verbal cues to return demonstrate.  Pt amb with RW to therapy gym and engaged in functional amb with RW for additional home mgmt tasks.  Focus on activity tolerance, RW safety, standing balance, tub transfers, and overall safety awareness.   [name] [facility] 10/10/2014, 2:32 PM

## 2014-10-10 NOTE — Plan of Care (Signed)
Problem: RH BLADDER ELIMINATION Goal: RH STG MANAGE BLADDER WITH ASSISTANCE STG Manage Bladder With minimal Assistance  Outcome: Progressing

## 2014-10-10 NOTE — Progress Notes (Signed)
[facility] PHYSICAL MEDICINE & REHABILITATION     PROGRESS NOTE    Subjective/Complaints: Agitated again this am. Won'Simpson work with therapy. confused  Objective: Vital Signs: Blood pressure 140/72, pulse 68, temperature 98.3 F (36.8 C), temperature source Oral, resp. rate 18, weight 89.676 kg (197 lb 11.2 oz), SpO2 95 %. No results found.  Recent Labs  10/08/14 0544  WBC 6.5  HGB 15.5  HCT 43.9  PLT 132*    Recent Labs  10/08/14 0544  NA 140  K 3.9  CL 103  GLUCOSE 161*  BUN 19  CREATININE 1.44*  CALCIUM 8.9   CBG (last 3)   Recent Labs  10/09/14 2112 10/09/14 2257 10/10/14 0705  GLUCAP 384* 285* 163*    Wt Readings from Last 3 Encounters:  10/08/14 89.676 kg (197 lb 11.2 oz)  10/03/14 93.9 kg (207 lb 0.2 oz)  09/25/14 93.169 kg (205 lb 6.4 oz)    Physical Exam:  Constitutional: He is oriented to person, place, and time. Sitting in chair with WB in place. HENT: tongue dry/split in center Healing abrasion to the right scalp stable Eyes: EOM are normal.  Neck: Normal range of motion. Neck supple. No thyromegaly present.  Cardiovascular: Normal rate and regular rhythm. Pacemaker site clean and dry with steristrips Respiratory: Effort normal and breath sounds normal. No respiratory distress.  GI: Soft. Bowel sounds are normal. He exhibits no distension.  Neurological: He is alert and oriented to person, place, and time.  Confused,  irritable. [name] Kitchen He was able to provide his name, age date of birth. Knew he was at [place] some of events leading to admissions. Follows commands. Didn'Simpson know today's date. Mild right facial droop. [name]. LE: 4/5 proximal to distal. Decreased sensation to PP/LT in both feet to ankles.  Skin:  Abrasions on right face,scalp. Scattered bruises/lacs on arms legs.  Psychiatric:  Confused and agitated today. Thinks it's raining outside and that we want him to go out in it  Assessment/Plan: 1. Functional deficits  secondary to TBI which require 3+ hours per day of interdisciplinary therapy in a comprehensive inpatient rehab setting. Physiatrist is providing close team supervision and 24 hour management of active medical problems listed below. Physiatrist and rehab team continue to assess barriers to discharge/monitor patient progress toward functional and medical goals. FIM: FIM - Bathing Bathing Steps Patient Completed: Chest, Right Arm, Left Arm, Abdomen, Front perineal area, Buttocks, Right upper leg, Left upper leg, Right lower leg (including foot), Left lower leg (including foot) Bathing: 4: Steadying assist  FIM - Upper Body Dressing/Undressing Upper body dressing/undressing steps patient completed: Thread/unthread right sleeve of pullover shirt/dresss, Pull shirt over trunk, Put head through opening of pull over shirt/dress, Thread/unthread right sleeve of front closure shirt/dress Upper body dressing/undressing: 5: Supervision: Safety issues/verbal cues FIM - Lower Body Dressing/Undressing Lower body dressing/undressing steps patient completed: Thread/unthread right underwear leg, Thread/unthread left underwear leg, Pull underwear up/down, Thread/unthread right pants leg, Don/Doff right sock, Fasten/unfasten pants, Pull pants up/down, Thread/unthread left pants leg, Don/Doff left sock, Don/Doff right shoe, Don/Doff left shoe, Fasten/unfasten right shoe, Fasten/unfasten left shoe Lower body dressing/undressing: 4: Steadying Assist  FIM - Toileting Toileting steps completed by patient: Performs perineal hygiene Toileting: 2: Max-Patient completed 1 of 3 steps  FIM - Radio producer Devices: Grab bars Toilet Transfers: 4-To toilet/BSC: Min A (steadying Pt. > 75%), 4-From toilet/BSC: Min A (steadying Pt. > 75%)  FIM - Control and instrumentation engineer Devices:  Arm rests, Bed rails, HOB elevated Bed/Chair Transfer: 5: Supine > Sit: Supervision (verbal  cues/safety issues), 5: Sit > Supine: Supervision (verbal cues/safety issues), 4: Bed > Chair or W/C: Min A (steadying Pt. > 75%), 4: Chair or W/C > Bed: Min A (steadying Pt. > 75%)  FIM - Locomotion: Wheelchair Distance: 100' Locomotion: Wheelchair: 2: Travels 50 - 149 ft with minimal assistance (Pt.>75%) FIM - Locomotion: Ambulation Locomotion: Ambulation Assistive Devices: Journalist, newspaper Ambulation/Gait Assistance: 4: Min guard Locomotion: Ambulation: 4: Travels 150 ft or more with minimal assistance (Pt.>75%)  Comprehension Comprehension Mode: Auditory Comprehension: 3-Understands basic 50 - 74% of the time/requires cueing 25 - 50%  of the time  Expression Expression Mode: Verbal Expression: 4-Expresses basic 75 - 89% of the time/requires cueing 10 - 24% of the time. Needs helper to occlude trach/needs to repeat words.  Social Interaction Social Interaction: 2-Interacts appropriately 25 - 49% of time - Needs frequent redirection.  Problem Solving Problem Solving: 2-Solves basic 25 - 49% of the time - needs direction more than half the time to initiate, plan or complete simple activities  Memory Memory: 2-Recognizes or recalls 25 - 49% of the time/requires cueing 51 - 75% of the time  Medical Problem List and Plan: 1. Functional deficits secondary to Traumatic brain injury/scalp hematoma after a fall with history of gait disorder 2. DVT Prophylaxis/Anticoagulation: SCDs.   3. Pain Management: hydrocodone as needed. Monitor with increased mobility 4. Symptomatic bradycardia with complete heart block. Status post pacemaker placement 10/06/2014 per Dr. Crissie Sickles.Aspirin has been resumed.Brilinta not resumed per cardiology services 5. Neuropsych: This patient is not capable of making decisions on his own behalf.  -more agitated and confused at night. Episodic agitation during the day  -add scheduled seroquel hs. Continue prn dosing also  -limit restraints as possible 6.  Skin/Wound Care: routine skin checks monitoring of scalp abrasion 7. Fluids/Electrolytes/Nutrition: strict I&O's. Follow-up chemistries. Provide nutritional supplements 8.Hypertension. Norvasc 5 mg daily,, Imdur 30 mg daily.Monitor with increased mobility 9.Diabetes mellitus with peripheral neuropathy. Sugars uncontrolled  -increased lantus to 25units qhs (home dose)  -pt was also on scheduled mealtime insulin at home (won'Simpson add just yet) 10.BPH. pvr's low.   Flomax 0.4 mg daily   11.Mood/depression.Patient on Zoloft 25 mg daily prior to admission. Provide emotional support as appropriate 12.Hyperlipidemia. Crestor LOS (Days) 3 A FACE TO FACE EVALUATION WAS PERFORMED  Frank Simpson 10/10/2014 8:33 AM

## 2014-10-10 NOTE — Plan of Care (Signed)
Problem: RH SKIN INTEGRITY Goal: RH STG SKIN FREE OF INFECTION/BREAKDOWN Remain free from infection and skin breakdown with minimal assist  Outcome: Progressing

## 2014-10-10 NOTE — Progress Notes (Signed)
Physical Therapy Session Note  Patient Details  Name: RIAAN TOLEDO MRN: [id number] Date of Birth: January 01, 1931  Today's Date: 10/10/2014 PT Individual Time: 1000-1100 PT Individual Time Calculation (min): 60 min   Short Term Goals: Week 1:  PT Short Term Goal 1 (Week 1): Pt to perform bed mobility with supervision in a standard bed PT Short Term Goal 2 (Week 1): Pt to perform transfers with LRAD and supervision, 50% of time PT Short Term Goal 3 (Week 1): Pt to ambulate 300' with LRAD and supervision, 50% of time PT Short Term Goal 4 (Week 1): Pt to negotiate up/down 3 steps with single rail and supervision, 50% of time  Skilled Therapeutic Interventions/Progress Updates:  1:1. Pt received semi reclined in bed, ready for therapy. Focus this session on dynamic balance during ambulation, safety during functional transfers and mobility and therex. Pt req min A for t/f sup<>sit EOB w/ HOB flat and w/out use of bed rails as well as during multiple t/f sit<>stand. Pt frequently attempting to stand with SPC in R UE vs. Pushing off w/ R UE as L UE in sling. Pt req min A during multiple bouts of ambulation >150' with SPC.   Pt performed DGI, received score of 10/24. Portions of DGI isolated for increased practice with emphasis on intellectual/emergent awareness of balance impairments. Pt and wife educated on results that pt remains at high risk for falls, emphasized importance of AD to decrease risk for fall. Pt with report of dizziness when looking up, will continue to monitor but pt and wife report that this happens at baseline. Pt demonstrating overall decreased use of SPC with increased challenge of task. Pt would greatly benefit from use of RW for increased overall safety, need clarification from physician regarding L UE use.  Pt with good tolerance to NuStep to target strength and endurance, level 5x56mn and level 4x519m with cues to maintain pace in 40s.   Pt left semi reclined in bed at end of  session w/ all needs in reach, bed alarm on and wife in room.   PA-C clarified following tx session that pt now ok to use L UE during functional tasks.   Therapy Documentation Precautions:  Precautions Precautions: Fall, ICD/Pacemaker Precaution Comments: Pacemaker precautions Required Braces or Orthoses: Sling Other Brace/Splint: sling for L UE, light WB  Restrictions Weight Bearing Restrictions: Yes Other Position/Activity Restrictions: light WB through L UE due to pacemaker placement   Pain: Pain Assessment Pain Assessment: No/denies pain    Balance: Standardized Balance Assessment Standardized Balance Assessment: Dynamic Gait Index Dynamic Gait Index Level Surface: Mild Impairment Change in Gait Speed: Moderate Impairment Gait with Horizontal Head Turns: Moderate Impairment Gait with Vertical Head Turns: Moderate Impairment Gait and Pivot Turn: Mild Impairment Step Over Obstacle: Moderate Impairment Step Around Obstacles: Moderate Impairment Steps: Moderate Impairment Total Score: 10  See FIM for current functional status  Therapy/Group: Individual Therapy  KiGilmore Laroche1/13/2015, 1:09 PM

## 2014-10-10 NOTE — Plan of Care (Signed)
Problem: RH BOWEL ELIMINATION Goal: RH STG MANAGE BOWEL WITH ASSISTANCE STG Manage Bowel with minimal Assistance.  Outcome: Not Progressing

## 2014-10-10 NOTE — Progress Notes (Signed)
Physical Therapy Session Note  Patient Details  Name: Frank Simpson MRN: [id number] Date of Birth: 11-01-1931  Today's Date: 10/10/2014 PT Individual Time: 1630-1700 PT Individual Time Calculation (min): 30 min   Short Term Goals: Week 1:  PT Short Term Goal 1 (Week 1): Pt to perform bed mobility with supervision in a standard bed PT Short Term Goal 2 (Week 1): Pt to perform transfers with LRAD and supervision, 50% of time PT Short Term Goal 3 (Week 1): Pt to ambulate 300' with LRAD and supervision, 50% of time PT Short Term Goal 4 (Week 1): Pt to negotiate up/down 3 steps with single rail and supervision, 50% of time  Skilled Therapeutic Interventions/Progress Updates:    Pt was found in room sleeping in bed with wife in room. Session focused on functional ambulation and balance. Pt ambulated room <> rehab gym with RW and close supervision with min verbal cues for appropriate pacing. Pt performed walking activity stepping over small obstacles in gym using RW and close supervision, min verbal cues provided for maneuvering over obstacles. Pt performed ambulation weaving around cones and sidestepping using RW and min guard during turns and min verbal cues for sequencing and use of RW. Pt performed alternating step-ups x 30 on one step using RW and close supervision, min verbal cues to hold RW closer. Pt performed standing activity on blue foam, decreasing BOS (rhomberg stance) and decreasing UE support (use of RW > no UE support); pt required min guard with activity on foam to prevent LOB. Pt was left sitting in recliner at nurse's station with pink safety belt in place and nurse notified of pt location.  Therapy Documentation Precautions:  Precautions Precautions: Fall, ICD/Pacemaker Precaution Comments: Pacemaker precautions Required Braces or Orthoses: Sling Other Brace/Splint: sling for L UE, light WB  Restrictions Weight Bearing Restrictions: Yes ((L) UE sling ) Other  Position/Activity Restrictions: light WB through L UE due to pacemaker placement Locomotion : Ambulation Ambulation/Gait Assistance: 5: Supervision   See FIM for current functional status  Therapy/Group: Individual Therapy  Naiah Donahoe  SPT 10/10/2014, 5:34 PM

## 2014-10-10 NOTE — Progress Notes (Signed)
Speech Language Pathology Daily Session Note  Patient Details  Name: Frank Simpson MRN: [id number] Date of Birth: 1931-07-20  Today's Date: 10/10/2014 SLP Individual Time: 0902-1002 SLP Individual Time Calculation (min): 60 min  Short Term Goals: Week 1: SLP Short Term Goal 1 (Week 1): Patient will orient to time, place and situation with supervision multimodal cues.  SLP Short Term Goal 2 (Week 1): Patient will identify 1 physical and 1 cognitive impairment with Mod A multimodal cues.  SLP Short Term Goal 3 (Week 1): Patient will utilize the call bell to request assistance with Max A multimodal cues.  SLP Short Term Goal 4 (Week 1): Patient will utilize schedule to recall new, daily information with Mod A multimodal cues.  SLP Short Term Goal 5 (Week 1): Patient will demonstrate selective attention to a task in a mildly distracting enviornment with for 30 minutes with Min A multimodal cues.  SLP Short Term Goal 6 (Week 1): Patient will demonstrate functional problem solving for basic and familiar tasks with Max A multimodal cues.   Skilled Therapeutic Interventions:  Pt was seen for skilled ST targeting cognitive goals.  Upon arrival, pt was partially reclined in bed, lethargic, but agreeable to participate in therapy with encouragement.  Per report, pt was very agitated this AM and last night and refused to participate in OT prior to arrival.  SLP encouraged pt to participate in therapeutic activities outside of the room; however, pt politely declined.  SLP facilitated the session with a basic money management task targeting organization and sustained attention.  Pt sorted coins into groups based on value with overall supervision-min assist cues for redirection in a quiet environment.  He required up to max assist for counting coins due to decreased working memory, decreased sustained attention, and fluctuating alertness.  SLP transferred pt to edge of bed to maximize attention and alertness  during further cognitive tasks.  During a basic, familiar card game, pt required overall min assist verbal cues for sequencing and organizing.  SLP increased task challenge by asking basic biographical questions during the card game to facilitate alternating attention.  Min assist needed to redirect pt to task after answering questions.  Pt making good progress towards meeting goals.  Continue per current plan of care.     FIM:  Comprehension Comprehension Mode: Auditory Comprehension: 3-Understands basic 50 - 74% of the time/requires cueing 25 - 50%  of the time Expression Expression Mode: Verbal Expression: 4-Expresses basic 75 - 89% of the time/requires cueing 10 - 24% of the time. Needs helper to occlude trach/needs to repeat words. Social Interaction Social Interaction: 3-Interacts appropriately 50 - 74% of the time - May be physically or verbally inappropriate. Problem Solving Problem Solving: 3-Solves basic 50 - 74% of the time/requires cueing 25 - 49% of the time Memory Memory: 2-Recognizes or recalls 25 - 49% of the time/requires cueing 51 - 75% of the time  Pain Pain Assessment Pain Assessment: No/denies pain  Therapy/Group: Individual Therapy   Windell Moulding, M.A. CCC-SLP  Magda Muise, Selinda Orion 10/10/2014, 11:46 AM

## 2014-10-10 NOTE — Progress Notes (Signed)
Social Work  Social Work Assessment and Plan  Patient Details  Name: Frank Simpson MRN: [id number] Date of Birth: 07-Nov-1931  Today's Date: 10/10/2014  Problem List:  Patient Active Problem List   Diagnosis Date Noted  . Gait disorder 10/07/2014  . Fall   . Traumatic brain injury with loss of consciousness of 31 minutes to 59 minutes 10/03/2014  . Syncope 10/03/2014  . Junctional bradycardia 10/03/2014  . Benign positional vertigo 09/25/2014  . Medicare annual wellness visit, initial 07/09/2014  . Mood disorder 07/09/2014  . B12 deficiency 06/11/2014  . Fall 04/23/2014  . Diabetes mellitus type 2 with retinopathy 02/19/2014  . Rash of face 01/03/2014  . Shoulder injury 07/10/2013  . Right knee pain 07/10/2013  . Memory loss 04/16/2013  . Syncope 04/02/2013  . Post herpetic neuralgia 02/26/2013  . Unstable angina 10/15/2012  . CAD (coronary artery disease) 10/15/2012  . Acute on chronic systolic CHF (congestive heart failure) 10/15/2012  . Benign hypertension with CKD (chronic kidney disease) stage III 10/15/2012  . Thrombocytopenia   . STEMI (ST elevation myocardial infarction) 08/06/2012  . Osteoarthritis   . CKD stage 3 due to type 2 diabetes mellitus   . Urinary incontinence   . Type 2 diabetes, uncontrolled, with neuropathy 07/18/2011  . Hypercholesterolemia 07/18/2011   Past Medical History:  Past Medical History  Diagnosis Date  . Hypertension   . Diabetes mellitus type 2 with retinopathy 1994    dm edu 2012  . Hyperlipidemia   . Osteoarthritis   . CRI (chronic renal insufficiency)     baseline Cr seems to be 1.7-1.8  . Urinary incontinence     s/p PTNS didn't help  . Glaucoma     and cataracts  . History of melanoma   . CAD (coronary artery disease)     07/2012 acute STEMI, mid LAD PCI - DES; cath 09/2012 patent LAD stent, non-hemodynamically significant Left Main/LAD disease, EF 55%  . Thrombocytopenia   . BCC (basal cell carcinoma of skin)     L  neck (Dr. Sherrye Payor)  . Complication of anesthesia     confused after cath 10/15/2012  . CVA (cerebral infarction) 09/2012    remote anterior limb of left internal capsule  . Post herpetic neuralgia   . Shingles in March 2014    right chest, across the back  . Syncope 04/02/2013  . Blurred vision   . Basal cell carcinoma 2015    L forearm ([facility])  . Dysplastic nevus 2015    L upper back, Lat margin involved ([facility])  . Diabetes mellitus without complication   . CHF (congestive heart failure)   . Postherpetic neuralgia   . Dyslipidemia   . Ischemic heart disease   . Benign positional vertigo   . Shingles   . Myocardial infarction   . Coronary artery disease   . Arthritis   . Cancer    Past Surgical History:  Past Surgical History  Procedure Laterality Date  . Replacement total knee  04/2010    RIGHT KNEE  . Tonsillectomy    . Cardiovascular stress test  04/27/2010    EF 75%, nuclear stress test with normal perfusion, no ischemia  . Foot surgery      metal pin in place  . Right shoulder    . Finger surgery      amputated finger  . Lexiscan myoview  10/2011    negative for ischemia  . Cataract extraction  12/12, 1/13  bilateral  . Coronary stent placement  07/2012    DES to mid LAD for STEMI  . Cardiac catheterization  10/15/2012  . Carotid US  03/2013    WNL  . US echocardiography  03/2013    inf/septal hypokinesis, mild LVH, EF 45%, LA mildly dilated  . Penile prosthesis implant    . Eye surgery    . Joint replacement    . Fracture surgery      foot, pin placement   Social History:  reports that he has never smoked. He has never used smokeless tobacco. He reports that he does not drink alcohol or use illicit drugs.  Family / Support Systems Marital Status: Married How Long?: 58 yrs Patient Roles: Spouse, Other (Comment) (active on his farm as well) Spouse/Significant Other: wife, Jefry Lesinski @ [phone] or ([phone] Children: one son, Deaven Urwin, living in [place] @ (Reader) [phone] or [phone] Anticipated Caregiver: wife Ability/Limitations of Caregiver: no limitations Caregiver Availability: 24/7 Family Dynamics: wife very involved and supportive but does note that she is "the only one"  Social History Preferred language: English Religion: Christian Cultural Background: NA Education: stopped at 10th grade Read: Yes Write: Yes Employment Status: Retired Date 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  He does not appear any any emotional distress during our conversation, however, ST had reported that he was very tearful during her session.  Wife reports that he becomes tearful very easily and quickly "ever since he had his heart attack".  He denies any emotional distress but will monitor and plan to refer to neuropsych for cognitive screen and coping.l Recent Psychosocial Issues: As noted, wife notes pt has been much more "emotional" since his heart attack/ surgery two years ago.  Has had multiple falls at home as well. Pyschiatric History: Pt denies any psychiatric hx.  Wife nodding "yes" as he reports this and notes that she does feel he had "periods of depression" and adds that his primary MD placed him on a "mild medicine".  No formal treatment/ counseling. Substance Abuse History: NA  Patient / Family Perceptions, Expectations & Goals Pt/Family understanding of illness & functional limitations: Pt aware "I hit my head" in the fall and suffered a TBI.  Wife  with basic understanding of pt's TBI and current cognitive/ functional limitations, however, feels he is approaching baseline both physically and cognitively Premorbid pt/family roles/activities: Active and independent but wife does report that pt has had several falls in the past and has even "hit his head lots of times....nothing this bad..." Anticipated changes in roles/activities/participation: little change anticipated if pt reaches supervision/ mod i goals as wife was providing this PTA Pt/family expectations/goals: "I just hope he's back to where he was."  Pt's goal is to "go home"  [employer]: None Premorbid Home Care/DME Agencies: None Transportation available at discharge: yes Resource referrals recommended: Neuropsychology  Discharge Planning Living Arrangements: Spouse/significant other Support Systems: Spouse/significant other, Children Type of Residence: Private residence Insurance Resources: [employer], Multimedia programmer (specify) Sports administrator) Financial Resources: [place] Referred: No Living Expenses: Own Money Management: Spouse Does the patient have any  problems obtaining your medications?: No Home Management: wife is primary support of household Patient/Family Preliminary Plans: pt to return home with wife providing 24/7 assistance as needed Social Work Anticipated Follow Up Needs: HH/OP Expected length of stay: 7-10 days  Clinical Impression Pleasant gentleman here following a fall, striking a tractor and now with TBI.  Wife at bedside and very attentive/ supportive.  Goals are set for supervision/ mod i and wife feels pt is approaching his baseline function.  Will follow for support and d/c planning needs.  No concerns at this point.  Trenita Hulme 10/10/2014, 1:19 PM

## 2014-10-10 NOTE — IPOC Note (Addendum)
Overall Plan of Care [facility]) Patient Details Name: WINIFRED BALOGH MRN: [id number] DOB: 10-31-1931  Admitting Diagnosis: TBI  S P FALL AND PACER PLACEMENT  Hospital Problems: Active Problems:   Type 2 diabetes, uncontrolled, with neuropathy   CKD stage 3 due to type 2 diabetes mellitus   Traumatic brain injury with loss of consciousness of 31 minutes to 59 minutes   Junctional bradycardia   Fall   Gait disorder     Functional Problem List: Nursing Behavior, Bladder, Bowel, Endurance, Medication Management, Nutrition, Perception, Safety, Skin Integrity  PT Balance, Endurance, Safety, Skin Integrity, Other (comment) (Strength, Cognition)  OT Balance, Behavior, Cognition, Endurance, Motor, Pain, Perception, Safety, Skin Integrity  SLP Cognition  TR         Basic ADL's: OT Eating, Grooming, Bathing, Dressing, Toileting     Advanced  ADL's: OT       Transfers: PT Bed to Chair, Car, Bed Mobility, Floor, Manufacturing systems engineer, Metallurgist: PT Ambulation, Stairs     Additional Impairments: OT None  SLP Social Cognition   Social Interaction, Problem Solving, Memory, Attention, Awareness  TR      Anticipated Outcomes Item Anticipated Outcome  Self Feeding I   Swallowing      Basic self-care  supervision  Toileting  supervision   Bathroom Transfers mod I   Bowel/Bladder  manage b/b miniimal assist  Transfers  Supervision  Locomotion  Supervision  Communication     Cognition  Min-Mod A   Pain  3 or less  Safety/Judgment  minimal assit   Therapy Plan: PT Intensity: Minimum of 1-2 x/day ,45 to 90 minutes PT Frequency: 5 out of 7 days PT Duration Estimated Length of Stay: 10-12days OT Intensity: Minimum of 1-2 x/day, 45 to 90 minutes OT Frequency: 5 out of 7 days OT Duration/Estimated Length of Stay: 10-12 days SLP Intensity: Minumum of 1-2 x/day, 30 to 90 minutes SLP Frequency: 5 out of 7 days SLP Duration Estimated Length of Stay: 10-12  days        Team Interventions: Nursing Interventions Patient/Family Education, Bladder Management, Disease Management/Prevention, Bowel Management, Medication Management, Skin Care/Wound Management, Cognitive Remediation/Compensation  PT interventions Ambulation/gait training, Community reintegration, DME/adaptive equipment instruction, Psychosocial support, Stair training, UE/LE Strength taining/ROM, UE/LE Coordination activities, Therapeutic Activities, Skin care/wound management, Discharge planning, Training and development officer, Cognitive remediation/compensation, Disease management/prevention, Functional mobility training, Patient/family education, Therapeutic Exercise, Neuromuscular re-education, Pain management  OT Interventions Training and development officer, Cognitive remediation/compensation, Community reintegration, Discharge planning, DME/adaptive equipment instruction, Neuromuscular re-education, Patient/family education, Self Care/advanced ADL retraining, Splinting/orthotics, Therapeutic Exercise, UE/LE Coordination activities, Visual/perceptual remediation/compensation, UE/LE Strength taining/ROM, Therapeutic Activities, Psychosocial support, Pain management, Functional mobility training  SLP Interventions Cognitive remediation/compensation, Cueing hierarchy, Functional tasks, Environmental controls, Internal/external aids, Patient/family education, Therapeutic Activities  TR Interventions    SW/CM Interventions Discharge Planning, Psychosocial Support, Patient/Family Education    Team Discharge Planning: Destination: PT-Home ,OT- Home , SLP-Home Projected Follow-up: PT-Home health PT, OT-  Outpatient OT, SLP-24 hour supervision/assistance, Home Health SLP Projected Equipment Needs: PT-To be determined, OT- To be determined, SLP-None recommended by SLP Equipment Details: PT- , OT-  Patient/family involved in discharge planning: PT- Patient, Family member/caregiver,  OT-Patient,  SLP-Patient, Family member/caregiver  MD ELOS: 10-12 days Medical Rehab Prognosis:  Excellent Assessment: The patient has been admitted for CIR therapies with the diagnosis of TBI. The team will be addressing functional mobility, strength, stamina, balance, safety, adaptive techniques and equipment, self-care, bowel and bladder mgt, patient  and caregiver education, NMR, cognitive perceptual rx, behavioral mgt, pain control, sleep restoration. Goals have been set at supervision for most mobility, self-care and min assist for cognitive tasks.    Meredith Staggers, MD, FAAPMR      See Team Conference Notes for weekly updates to the plan of care

## 2014-10-10 NOTE — Plan of Care (Signed)
Problem: RH SAFETY Goal: RH STG ADHERE TO SAFETY PRECAUTIONS W/ASSISTANCE/DEVICE STG Adhere to Safety Precautions With \Mod I Assistance/Device.  Outcome: Not Progressing  Comments:  Intervention of quick release while in wheelchair/recliner, patient will remove and get oob. Patient oob in wheelchair/recliner should be at nurses station for supervision

## 2014-10-10 NOTE — Care Management Note (Signed)
[facility] Individual Statement of Services  Patient Name:  Frank Simpson  Date:  10/10/2014  Welcome to the [facility].  Our goal is to provide you with an individualized program based on your diagnosis and situation, designed to meet your specific needs.  With this comprehensive rehabilitation program, you will be expected to participate in at least 3 hours of rehabilitation therapies Monday-Friday, with modified therapy programming on the weekends.  Your rehabilitation program will include the following services:  Physical Therapy (PT), Occupational Therapy (OT), Speech Therapy (ST), 24 hour per day rehabilitation nursing, Therapeutic Recreaction (TR), Neuropsychology, Case Management (Social Worker), Rehabilitation Medicine, Nutrition Services and Pharmacy Services  Weekly team conferences will be held on Tuesdays to discuss your progress.  Your Social Worker will talk with you frequently to get your input and to update you on team discussions.  Team conferences with you and your family in attendance may also be held.  Expected length of stay: 10-12 days  Overall anticipated outcome: supervision  Depending on your progress and recovery, your program may change. Your Social Worker will coordinate services and will keep you informed of any changes. Your Social Worker's name and contact numbers are listed  below.  The following services may also be recommended but are not provided by the [facility] will be made to provide these services after discharge if needed.  Arrangements include referral to agencies that provide these services.  Your insurance has been verified to be:  Medicare and [employer] primary doctor is:  Dr. Danise Mina  Pertinent information will be shared with your doctor and your  insurance company.  Social Worker:  [place], [place] or ([phone]   Information discussed with and copy given to patient by: Lennart Pall, 10/10/2014, 9:10 AM

## 2014-10-11 ENCOUNTER — Inpatient Hospital Stay (HOSPITAL_COMMUNITY): Payer: Medicare Other | Admitting: Speech Pathology

## 2014-10-11 DIAGNOSIS — S069X2D Unspecified intracranial injury with loss of consciousness of 31 minutes to 59 minutes, subsequent encounter: Secondary | ICD-10-CM

## 2014-10-11 LAB — GLUCOSE, CAPILLARY
GLUCOSE-CAPILLARY: 295 mg/dL — AB (ref 70–99)
GLUCOSE-CAPILLARY: 316 mg/dL — AB (ref 70–99)
GLUCOSE-CAPILLARY: 243 mg/dL — AB (ref 70–99)
Glucose-Capillary: 182 mg/dL — ABNORMAL HIGH (ref 70–99)

## 2014-10-11 NOTE — Plan of Care (Signed)
Problem: RH BOWEL ELIMINATION Goal: RH STG MANAGE BOWEL WITH ASSISTANCE STG Manage Bowel with minimal Assistance.  Outcome: Progressing  Problem: RH BLADDER ELIMINATION Goal: RH STG MANAGE BLADDER WITH ASSISTANCE STG Manage Bladder With minimal Assistance  Outcome: Progressing  Problem: RH SKIN INTEGRITY Goal: RH STG SKIN FREE OF INFECTION/BREAKDOWN Remain free from infection and skin breakdown with minimal assist  Outcome: Progressing  Problem: RH SAFETY Goal: RH STG ADHERE TO SAFETY PRECAUTIONS W/ASSISTANCE/DEVICE STG Adhere to Safety Precautions With \Mod I Assistance/Device.  Outcome: Progressing

## 2014-10-11 NOTE — Progress Notes (Signed)
[place] PHYSICAL MEDICINE & REHABILITATION     PROGRESS NOTE    Subjective/Complaints: No agitation, "slept better", wasn't at RN station last noc ROS- limited secondary to reduced awareness denies pain Objective: Vital Signs: Blood pressure 151/70, pulse 73, temperature 97.6 F (36.4 C), temperature source Oral, resp. rate 18, weight 89.676 kg (197 lb 11.2 oz), SpO2 95 %. No results found. No results for input(s): WBC, HGB, HCT, PLT in the last 72 hours. No results for input(s): NA, K, CL, GLUCOSE, BUN, CREATININE, CALCIUM in the last 72 hours.  Invalid input(s): CO CBG (last 3)   Recent Labs  10/10/14 1636 10/10/14 2116 10/11/14 0659  GLUCAP 304* 199* 182*    Wt Readings from Last 3 Encounters:  10/08/14 89.676 kg (197 lb 11.2 oz)  10/03/14 93.9 kg (207 lb 0.2 oz)  09/25/14 93.169 kg (205 lb 6.4 oz)    Physical Exam:  Constitutional: He is oriented to person, place, and time. Sitting in chair with WB in place. HENT: tongue dry/split in center Healing abrasion to the right scalp stable Eyes: EOM are normal.  Neck: Normal range of motion. Neck supple. No thyromegaly present.  Cardiovascular: Normal rate and regular rhythm. Pacemaker site clean and dry with steristrips Respiratory: Effort normal and breath sounds normal. No respiratory distress.  GI: Soft. Bowel sounds are normal. He exhibits no distension.  Neurological: He is alert and oriented to person, place, and time.  Confused,  irritable. [name] Kitchen He was able to provide his name, age date of birth. Knew he was at [facility]. Not oriented to month. Follows commands. Didn't know today's date. Mild right facial droop. [name]. LE: 4/5 proximal to distal. Decreased sensation to PP/LT in both feet to ankles.  Skin:  Abrasions on right face,scalp. Scattered bruises/lacs on arms legs.  Psychiatric:  Confused and agitated today. Thinks it's raining outside and that we want him to go out in  it  Assessment/Plan: 1. Functional deficits secondary to TBI which require 3+ hours per day of interdisciplinary therapy in a comprehensive inpatient rehab setting. Physiatrist is providing close team supervision and 24 hour management of active medical problems listed below. Physiatrist and rehab team continue to assess barriers to discharge/monitor patient progress toward functional and medical goals. FIM: FIM - Bathing Bathing Steps Patient Completed: Chest, Right Arm, Left Arm, Abdomen, Front perineal area, Buttocks, Right upper leg, Left upper leg, Right lower leg (including foot), Left lower leg (including foot) Bathing: 4: Steadying assist  FIM - Upper Body Dressing/Undressing Upper body dressing/undressing steps patient completed: Thread/unthread right sleeve of pullover shirt/dresss, Pull shirt over trunk, Put head through opening of pull over shirt/dress, Thread/unthread right sleeve of front closure shirt/dress Upper body dressing/undressing: 5: Supervision: Safety issues/verbal cues FIM - Lower Body Dressing/Undressing Lower body dressing/undressing steps patient completed: Thread/unthread right underwear leg, Thread/unthread left underwear leg, Pull underwear up/down, Thread/unthread right pants leg, Don/Doff right sock, Fasten/unfasten pants, Pull pants up/down, Thread/unthread left pants leg, Don/Doff left sock, Don/Doff right shoe, Don/Doff left shoe, Fasten/unfasten right shoe, Fasten/unfasten left shoe Lower body dressing/undressing: 4: Steadying Assist  FIM - Toileting Toileting steps completed by patient: Performs perineal hygiene Toileting: 2: Max-Patient completed 1 of 3 steps  FIM - Radio producer Devices: Grab bars Toilet Transfers: 4-To toilet/BSC: Min A (steadying Pt. > 75%), 4-From toilet/BSC: Min A (steadying Pt. > 75%)  FIM - Bed/Chair Transfer Bed/Chair Transfer Assistive Devices: Walker, Bed rails Bed/Chair Transfer: 5: Supine >  Sit: Supervision (  verbal cues/safety issues), 5: Bed > Chair or W/C: Supervision (verbal cues/safety issues), 5: Chair or W/C > Bed: Supervision (verbal cues/safety issues)  FIM - Locomotion: Wheelchair Distance: 100' Locomotion: Wheelchair: 0: Activity did not occur FIM - Locomotion: Ambulation Locomotion: Ambulation Assistive Devices: Administrator Ambulation/Gait Assistance: 5: Supervision Locomotion: Ambulation: 5: Travels 150 ft or more with supervision/safety issues  Comprehension Comprehension Mode: Auditory Comprehension: 3-Understands basic 50 - 74% of the time/requires cueing 25 - 50%  of the time  Expression Expression Mode: Verbal Expression: 4-Expresses basic 75 - 89% of the time/requires cueing 10 - 24% of the time. Needs helper to occlude trach/needs to repeat words.  Social Interaction Social Interaction: 4-Interacts appropriately 75 - 89% of the time - Needs redirection for appropriate language or to initiate interaction.  Problem Solving Problem Solving: 4-Solves basic 75 - 89% of the time/requires cueing 10 - 24% of the time  Memory Memory: 2-Recognizes or recalls 25 - 49% of the time/requires cueing 51 - 75% of the time  Medical Problem List and Plan: 1. Functional deficits secondary to Traumatic brain injury/scalp hematoma after a fall with history of gait disorder 2. DVT Prophylaxis/Anticoagulation: SCDs.   3. Pain Management: hydrocodone as needed. Monitor with increased mobility 4. Symptomatic bradycardia with complete heart block. Status post pacemaker placement 10/06/2014 per Dr. Crissie Sickles.Aspirin has been resumed.Brilinta not resumed per cardiology services 5. Neuropsych: This patient is not capable of making decisions on his own behalf.  -more agitated and confused at night. Episodic agitation during the day  -add scheduled seroquel hs. Continue prn dosing also  -limit restraints as possible 6. Skin/Wound Care: routine skin checks monitoring of  scalp abrasion 7. Fluids/Electrolytes/Nutrition: strict I&O's. Follow-up chemistries. Provide nutritional supplements 8.Hypertension. Norvasc 5 mg daily,, Imdur 30 mg daily.Monitor with increased mobility 9.Diabetes mellitus with peripheral neuropathy. Sugars uncontrolled  -increased lantus to 25units qhs (home dose)  -pt was also on scheduled mealtime insulin at home (won't add just yet) 10.BPH. pvr's low.   Flomax 0.4 mg daily   11.Mood/depression.Patient on Zoloft 25 mg daily prior to admission. Provide emotional support as appropriate 12.Hyperlipidemia. Crestor LOS (Days) 4 A FACE TO FACE EVALUATION WAS PERFORMED  Charlett Blake 10/11/2014 8:47 AM

## 2014-10-11 NOTE — Progress Notes (Signed)
Speech Language Pathology Daily Session Note  Patient Details  Name: Frank Simpson MRN: [id number] Date of Birth: Dec 10, 1930  Today's Date: 10/11/2014 SLP Individual Time: 1020-1105 SLP Individual Time Calculation (min): 45 min  Short Term Goals: Week 1: SLP Short Term Goal 1 (Week 1): Patient will orient to time, place and situation with supervision multimodal cues.  SLP Short Term Goal 2 (Week 1): Patient will identify 1 physical and 1 cognitive impairment with Mod A multimodal cues.  SLP Short Term Goal 3 (Week 1): Patient will utilize the call bell to request assistance with Max A multimodal cues.  SLP Short Term Goal 4 (Week 1): Patient will utilize schedule to recall new, daily information with Mod A multimodal cues.  SLP Short Term Goal 5 (Week 1): Patient will demonstrate selective attention to a task in a mildly distracting enviornment with for 30 minutes with Min A multimodal cues.  SLP Short Term Goal 6 (Week 1): Patient will demonstrate functional problem solving for basic and familiar tasks with Max A multimodal cues.   Skilled Therapeutic Interventions: Skilled treatment session focused on addressing cognitive goals.  SLP facilitated session with Min verbal cues to problem solve bed to chair transfer.  Patient required Max multimodal cues to utilize external aids for orientation and to locate choices for next meal.  SLP also facilitated session with Max-Total assist for intellectual awareness of deficits and overall safety awareness throughout session.  Continue with current plan of care.   FIM:  Comprehension Comprehension Mode: Auditory Comprehension: 3-Understands basic 50 - 74% of the time/requires cueing 25 - 50%  of the time Expression Expression Mode: Verbal Expression: 4-Expresses basic 75 - 89% of the time/requires cueing 10 - 24% of the time. Needs helper to occlude trach/needs to repeat words. Social Interaction Social Interaction: 4-Interacts appropriately 75 -  89% of the time - Needs redirection for appropriate language or to initiate interaction. Problem Solving Problem Solving: 2-Solves basic 25 - 49% of the time - needs direction more than half the time to initiate, plan or complete simple activities Memory Memory: 2-Recognizes or recalls 25 - 49% of the time/requires cueing 51 - 75% of the time  Pain Pain Assessment Pain Assessment: No/denies pain  Therapy/Group: Individual Therapy  Carmelia Roller., [place] [phone]  [place] 10/11/2014, 12:46 PM

## 2014-10-11 NOTE — Plan of Care (Signed)
Problem: RH BLADDER ELIMINATION Goal: RH STG MANAGE BLADDER WITH ASSISTANCE STG Manage Bladder With minimal Assistance  Outcome: Progressing  Problem: RH SKIN INTEGRITY Goal: RH STG SKIN FREE OF INFECTION/BREAKDOWN Remain free from infection and skin breakdown with minimal assist  Outcome: Progressing  Problem: RH SAFETY Goal: RH STG ADHERE TO SAFETY PRECAUTIONS W/ASSISTANCE/DEVICE STG Adhere to Safety Precautions With \Mod I Assistance/Device.  Outcome: Progressing

## 2014-10-12 ENCOUNTER — Inpatient Hospital Stay (HOSPITAL_COMMUNITY): Payer: Medicare Other | Admitting: Occupational Therapy

## 2014-10-12 ENCOUNTER — Inpatient Hospital Stay (HOSPITAL_COMMUNITY): Payer: Self-pay

## 2014-10-12 LAB — GLUCOSE, CAPILLARY
GLUCOSE-CAPILLARY: 62 mg/dL — AB (ref 70–99)
GLUCOSE-CAPILLARY: 267 mg/dL — AB (ref 70–99)
Glucose-Capillary: 143 mg/dL — ABNORMAL HIGH (ref 70–99)
Glucose-Capillary: 330 mg/dL — ABNORMAL HIGH (ref 70–99)
Glucose-Capillary: 315 mg/dL — ABNORMAL HIGH (ref 70–99)

## 2014-10-12 MED ORDER — INSULIN GLARGINE 100 UNIT/ML ~~LOC~~ SOLN
20.0000 [IU] | Freq: Every day | SUBCUTANEOUS | Status: DC
  Administered 2014-10-12 – 2014-10-16 (×5): 20 [IU] via SUBCUTANEOUS
  Filled 2014-10-12 (×6): qty 0.2

## 2014-10-12 NOTE — Progress Notes (Signed)
Hypoglycemic Event  CBG: 62  Treatment: 15 GM carbohydrate snack  Symptoms: None  Follow-up CBG: [id number] CBG Result:143  Possible Reasons for Event: Unknown  Comments/MD notified:Dr Kirsteins notified    Frank Simpson, Frank Simpson  Remember to initiate Hypoglycemia Order Set & complete

## 2014-10-12 NOTE — Progress Notes (Signed)
Physical Therapy Session Note  Patient Details  Name: Frank Simpson MRN: [id number] Date of Birth: 1931/07/06  Today's Date: 10/12/2014 PT Individual Time: 0900-1000 PT Individual Time Calculation (min): 60 min   Session 2 Time: 1400-1430 Time Calculation (min): 30 min  Short Term Goals: Week 1:  PT Short Term Goal 1 (Week 1): Pt to perform bed mobility with supervision in a standard bed PT Short Term Goal 2 (Week 1): Pt to perform transfers with LRAD and supervision, 50% of time PT Short Term Goal 3 (Week 1): Pt to ambulate 300' with LRAD and supervision, 50% of time PT Short Term Goal 4 (Week 1): Pt to negotiate up/down 3 steps with single rail and supervision, 50% of time  Skilled Therapeutic Interventions/Progress Updates:    Session 1: Pt received seated in recliner, agreeable to participate in therapy. Ambulated 200' through controlled environment w/ RW w/ CGA and SPC w/ MinA. Sit<>stand from various surfaces including mat tables of differing heighhts, chair w/ arm rests and w/o. Car transfer w/ SBA. 9 stairs w/ B rails and MinGuard A, step over step ascending and step to descending. Noted pt very emotionally labile during session, along with very poor safety awareness (seems may be pre-morbid, as pt often refused to use AD during mobility despite multiple falls). Pt educated repeatedly on use of RW for safety after discharge, however pt often indicated that he wouldn't use it. During ambulation w/ SPC pt would often just drag along cane or carry it without having it touch ground. Pt acknowledged he did this but did not change his behavior. Pt often seemed to resigned to the fact that he would fall again after discharge and did not want to change his behavior in order to prevent it. Pt left seated in recliner w/ wife present for supervision, wife instructed to let nursing know if she was going to leave the room.   Session 2: Pt received supine in bed, agreeable to participate in therapy.  Moved supine>sit w/ supervision from flat bed w/ no bed rails to simulate home environment. Ambulated 150' to rehab gym w/ RW. Engaged task of finding 10 horseshoes around rehab gym at different heights, requiring min cueing to find last 2 horseshoes. Noted pt w/ increased difficulty finding targets on L side of visual environment. Engaged in standing on foam w/ eyes open 30" and eyes closed 20" w/ initially 1 HHA progressing to CGA to encourage ankle balance strategies. Additional bout of standing on foam, eyes open w/ alternating isometrics min resistance from therapist at shoulders and hips to encourage stabilizing muscle activation. Pt ambulated back to room w/ close S, left seated in recliner w/ family present and all needs within reach.    Therapy Documentation Precautions:  Precautions Precautions: Fall, ICD/Pacemaker Precaution Comments: Pacemaker precautions Required Braces or Orthoses: Sling Other Brace/Splint: sling for L UE, light WB  Restrictions Weight Bearing Restrictions: Yes Other Position/Activity Restrictions: light WB through L UE due to pacemaker placement General:   Vital Signs: Therapy Vitals Temp: 98.9 F (37.2 C) Temp Source: Oral Pulse Rate: 73 Resp: 18 BP: (!) 168/77 mmHg Patient Position (if appropriate): Lying Oxygen Therapy SpO2: 93 % O2 Device: Not Delivered Pain:   0/10  See FIM for current functional status  Therapy/Group: Individual Therapy  Rada Hay 10/12/2014, 7:45 AM

## 2014-10-12 NOTE — Plan of Care (Signed)
Problem: RH BLADDER ELIMINATION Goal: RH STG MANAGE BLADDER WITH ASSISTANCE STG Manage Bladder With minimal Assistance  Outcome: Progressing  Problem: RH SKIN INTEGRITY Goal: RH STG SKIN FREE OF INFECTION/BREAKDOWN Remain free from infection and skin breakdown with minimal assist  Outcome: Progressing  Problem: RH SAFETY Goal: RH STG ADHERE TO SAFETY PRECAUTIONS W/ASSISTANCE/DEVICE STG Adhere to Safety Precautions With \Mod I Assistance/Device.  Outcome: Progressing

## 2014-10-12 NOTE — Progress Notes (Signed)
[place] PHYSICAL MEDICINE & REHABILITATION     PROGRESS NOTE    Subjective/Complaints: No agitation, +constipation, incont of urine ROS- limited secondary to reduced awareness denies pain Objective: Vital Signs: Blood pressure 168/77, pulse 73, temperature 98.9 F (37.2 C), temperature source Oral, resp. rate 18, weight 89.676 kg (197 lb 11.2 oz), SpO2 93 %. No results found. No results for input(s): WBC, HGB, HCT, PLT in the last 72 hours. No results for input(s): NA, K, CL, GLUCOSE, BUN, CREATININE, CALCIUM in the last 72 hours.  Invalid input(s): CO CBG (last 3)   Recent Labs  10/11/14 2042 10/12/14 0649 10/12/14 0746  GLUCAP 243* 62* 143*    Wt Readings from Last 3 Encounters:  10/08/14 89.676 kg (197 lb 11.2 oz)  10/03/14 93.9 kg (207 lb 0.2 oz)  09/25/14 93.169 kg (205 lb 6.4 oz)    Physical Exam:  Constitutional: He is oriented to person, place, and time. Sitting in chair with WB in place. HENT: tongue dry/split in center Healing abrasion to the right scalp stable Eyes: EOM are normal.  Neck: Normal range of motion. Neck supple. No thyromegaly present.  Cardiovascular: Normal rate and regular rhythm. Pacemaker site clean and dry with steristrips Respiratory: Effort normal and breath sounds normal. No respiratory distress.  GI: Soft. Bowel sounds are normal. He exhibits no distension.  Neurological: He is alert and oriented to person, place, and time.  Confused,  irritable. [name] Kitchen He was able to provide his name, age date of birth. Knew he was at [facility]. Not oriented to month. Follows commands. Didn't know today's date. Mild right facial droop. [name]. LE: 4/5 proximal to distal. Decreased sensation to PP/LT in both feet to ankles.  Skin:  Abrasions on right face,scalp. Scattered bruises/lacs on arms legs.  Psychiatric:  Confused and agitated today. Thinks it's raining outside and that we want him to go out in it  Assessment/Plan: 1. Functional  deficits secondary to TBI which require 3+ hours per day of interdisciplinary therapy in a comprehensive inpatient rehab setting. Physiatrist is providing close team supervision and 24 hour management of active medical problems listed below. Physiatrist and rehab team continue to assess barriers to discharge/monitor patient progress toward functional and medical goals. FIM: FIM - Bathing Bathing Steps Patient Completed: Chest, Right Arm, Left Arm, Abdomen, Front perineal area, Buttocks, Right upper leg, Left upper leg, Right lower leg (including foot), Left lower leg (including foot) Bathing: 4: Steadying assist  FIM - Upper Body Dressing/Undressing Upper body dressing/undressing steps patient completed: Thread/unthread right sleeve of pullover shirt/dresss, Pull shirt over trunk, Put head through opening of pull over shirt/dress, Thread/unthread right sleeve of front closure shirt/dress Upper body dressing/undressing: 5: Supervision: Safety issues/verbal cues FIM - Lower Body Dressing/Undressing Lower body dressing/undressing steps patient completed: Thread/unthread right underwear leg, Thread/unthread left underwear leg, Pull underwear up/down, Thread/unthread right pants leg, Don/Doff right sock, Fasten/unfasten pants, Pull pants up/down, Thread/unthread left pants leg, Don/Doff left sock, Don/Doff right shoe, Don/Doff left shoe, Fasten/unfasten right shoe, Fasten/unfasten left shoe Lower body dressing/undressing: 4: Steadying Assist  FIM - Toileting Toileting steps completed by patient: Performs perineal hygiene Toileting: 2: Max-Patient completed 1 of 3 steps  FIM - Radio producer Devices: Grab bars Toilet Transfers: 4-To toilet/BSC: Min A (steadying Pt. > 75%), 4-From toilet/BSC: Min A (steadying Pt. > 75%)  FIM - Bed/Chair Transfer Bed/Chair Transfer Assistive Devices: Walker, Bed rails Bed/Chair Transfer: 5: Supine > Sit: Supervision (verbal cues/safety  issues), 5:  Bed > Chair or W/C: Supervision (verbal cues/safety issues), 5: Chair or W/C > Bed: Supervision (verbal cues/safety issues)  FIM - Locomotion: Wheelchair Distance: 100' Locomotion: Wheelchair: 0: Activity did not occur FIM - Locomotion: Ambulation Locomotion: Ambulation Assistive Devices: Administrator Ambulation/Gait Assistance: 5: Supervision Locomotion: Ambulation: 5: Travels 150 ft or more with supervision/safety issues  Comprehension Comprehension Mode: Auditory Comprehension: 3-Understands basic 50 - 74% of the time/requires cueing 25 - 50%  of the time  Expression Expression Mode: Verbal Expression: 4-Expresses basic 75 - 89% of the time/requires cueing 10 - 24% of the time. Needs helper to occlude trach/needs to repeat words.  Social Interaction Social Interaction: 3-Interacts appropriately 50 - 74% of the time - May be physically or verbally inappropriate.  Problem Solving Problem Solving: 2-Solves basic 25 - 49% of the time - needs direction more than half the time to initiate, plan or complete simple activities  Memory Memory: 2-Recognizes or recalls 25 - 49% of the time/requires cueing 51 - 75% of the time  Medical Problem List and Plan: 1. Functional deficits secondary to Traumatic brain injury/scalp hematoma after a fall with history of gait disorder 2. DVT Prophylaxis/Anticoagulation: SCDs.   3. Pain Management: hydrocodone as needed. Monitor with increased mobility 4. Symptomatic bradycardia with complete heart block. Status post pacemaker placement 10/06/2014 per Dr. Crissie Sickles.Aspirin has been resumed.Brilinta not resumed per cardiology services 5. Neuropsych: This patient is not capable of making decisions on his own behalf.  -more agitated and confused at night. Episodic agitation during the day  -add scheduled seroquel hs. Continue prn dosing also  -limit restraints as possible 6. Skin/Wound Care: routine skin checks monitoring of scalp  abrasion 7. Fluids/Electrolytes/Nutrition: strict I&O's. Follow-up chemistries. Provide nutritional supplements 8.Hypertension. Norvasc 5 mg daily,, Imdur 30 mg daily.Monitor with increased mobility, mainly am elevation, would check in sitting position, check orthostatics q am x 3 9.Diabetes mellitus with peripheral neuropathy. Sugars uncontrolled  -increased lantus to 25units qhs (home dose)  -pt was also on scheduled mealtime insulin at home (won't add just yet) 10.BPH. pvr's low.   Flomax 0.4 mg daily   11.Mood/depression.Patient on Zoloft 25 mg daily prior to admission. Provide emotional support as appropriate 12.Hyperlipidemia. Crestor LOS (Days) 5 A FACE TO FACE EVALUATION WAS PERFORMED  General Wearing E 10/12/2014 8:40 AM

## 2014-10-12 NOTE — Progress Notes (Signed)
Occupational Therapy Session Note  Patient Details  Name: Frank Simpson MRN: [id number] Date of Birth: [birth year]  Today's Date: 10/12/2014   First session: OT Individual Time: 2409-7353 OT Individual Time Calculation (min): 45 min Patient scheduled for ADL and c/o sleepiness as he and his wife stated he toileted all night long.  His ADL was performed on the side of the bed with focus on orientation, problem solving and static and dynamic balance (He tended to lean posteriorally at times - maybe partially due to the sleepiness).   Wife present for session   Second session:   1300-1345  Total Minutes this session=45 Patient stood for about 25 minutes straight ( sat down for the other 20 minutes due to complaints of fatigue) at hi lo table to work on money mgmt tasks, safety & problem solving pictures and static standing balalnce.   Wife present for session.  Therapy Documentation Precautions:  Precautions Precautions: Fall, ICD/Pacemaker Precaution Comments: Pacemaker precautions Required Braces or Orthoses: Sling Other Brace/Splint: sling for L UE, light WB  Restrictions Weight Bearing Restrictions: Yes Other Position/Activity Restrictions: light WB through L UE due to pacemaker placement  Pain: denied    See FIM for current functional status  Therapy/Group: Individual Therapy  Herschell Dimes 10/12/2014, 2:31 PM

## 2014-10-13 ENCOUNTER — Inpatient Hospital Stay (HOSPITAL_COMMUNITY): Payer: Self-pay | Admitting: Rehabilitation

## 2014-10-13 ENCOUNTER — Inpatient Hospital Stay (HOSPITAL_COMMUNITY): Payer: Medicare Other | Admitting: Speech Pathology

## 2014-10-13 ENCOUNTER — Inpatient Hospital Stay (HOSPITAL_COMMUNITY): Payer: Medicare Other

## 2014-10-13 ENCOUNTER — Encounter (HOSPITAL_COMMUNITY): Payer: Self-pay

## 2014-10-13 DIAGNOSIS — S069X2A Unspecified intracranial injury with loss of consciousness of 31 minutes to 59 minutes, initial encounter: Secondary | ICD-10-CM

## 2014-10-13 LAB — GLUCOSE, CAPILLARY
GLUCOSE-CAPILLARY: 300 mg/dL — AB (ref 70–99)
GLUCOSE-CAPILLARY: 292 mg/dL — AB (ref 70–99)
GLUCOSE-CAPILLARY: 375 mg/dL — AB (ref 70–99)
Glucose-Capillary: 263 mg/dL — ABNORMAL HIGH (ref 70–99)

## 2014-10-13 MED ORDER — QUETIAPINE FUMARATE 25 MG PO TABS
25.0000 mg | ORAL_TABLET | Freq: Every day | ORAL | Status: DC
  Administered 2014-10-13 – 2014-10-17 (×5): 25 mg via ORAL
  Filled 2014-10-13 (×6): qty 1

## 2014-10-13 MED ORDER — INSULIN GLARGINE 100 UNIT/ML ~~LOC~~ SOLN
10.0000 [IU] | Freq: Every day | SUBCUTANEOUS | Status: DC
  Administered 2014-10-13 – 2014-10-15 (×3): 10 [IU] via SUBCUTANEOUS
  Filled 2014-10-13 (×3): qty 0.1

## 2014-10-13 NOTE — Progress Notes (Signed)
Physical Therapy Session Note  Patient Details  Name: Frank Simpson MRN: [id number] Date of Birth: July 21, 1931  Today's Date: 10/13/2014 PT Individual Time: Treatment Session 1: 1300-1400; Treatment Session 2: 6761-9509 PT Individual Time Calculation (min): Treatment Session 1: 60 min ; Treatment Session 2: 65mn  Short Term Goals: Week 1:  PT Short Term Goal 1 (Week 1): Pt to perform bed mobility with supervision in a standard bed PT Short Term Goal 2 (Week 1): Pt to perform transfers with LRAD and supervision, 50% of time PT Short Term Goal 3 (Week 1): Pt to ambulate 300' with LRAD and supervision, 50% of time PT Short Term Goal 4 (Week 1): Pt to negotiate up/down 3 steps with single rail and supervision, 50% of time  Skilled Therapeutic Interventions/Progress Updates:  Treatment Session 1:  1:1. Pt received sitting in w/c at nurses station with quick release belt in place, ready for therapy. Pt transported back to room to obtain RW. Pt denied pain. Focus this session on cognitive remediation and safety during functional transfers and mobility. Pt req min guard A for all t/f sit<>stand as well as multiple bouts of ambulation >150' on unit as well as 300'x2 off unit with RW including negotiation on/off elevators, in busy hospital lobby and gift shop. Pt req min A for safety with management of RW regarding proximity and to not leave it behind, ongoing education regarding importance of AD for fall prevention and increased functional independence.   Pt req max cues with use of external aids for orientation to complete sign-out sheet at RN station prior to going off unit. Pt engaged in pathfinding activity to navigate from rehab unit<>gift shop to target problem solving, selective attention and memory. Pt req mod cues to navigate from rehab unit>gift shop with use of external aids, but min A to navigate from gift shop>rehab unit relying primarily on memory.  Pt req min cues for attention to task and  mod cues to remember and find three items in gift shop.   Pt practiced negotiation up/down 8 steps w/ B UE on single rail sideways, req min cues for safety and correct step-to pattern.   Pt denied need to use bathroom at start of session, but found to be incontinent of urine at end of session. Pt req steadying assist for clean up.   Pt left sitting in w/c at nurses station with quick release belt in place.   Treatment Session 2:  1:1. Pt received sitting in w/c at nurses station with quick release belt in place, ready for therapy. Pt transported back to room to obtain RW. Pt denied pain. Focus this session on safety during functional transfers and ambulation and therex. Pt req close(S) for all t/f sit<>stand with RW as well as ambulation 150-175'x3 on unit with RW, intermittent cues to decrease pace and for proximity to RW for increased safety overall. Pt able to safely complete car transfer with RW and min guard A, min cues for safety.   Pt utilized NuStep for B LE strength and endurance, level 4x157m with good tolerance when cued to keep pace in 50's.   Pt req supervision for t/f sit>sup back to bed at end of session, left with all needs in reach and bed alarm on.   Therapy Documentation Precautions:  Precautions Precautions: Fall, ICD/Pacemaker Precaution Comments: Pacemaker precautions Required Braces or Orthoses: Sling Other Brace/Splint: sling for L UE, light WB  Restrictions Weight Bearing Restrictions: Yes Other Position/Activity Restrictions: light WB through L UE  due to pacemaker placement Vital Signs: Therapy Vitals Temp: 97.9 F (36.6 C) Temp Source: Oral Pulse Rate: 87 Resp: 18 BP: (!) 97/57 mmHg Patient Position (if appropriate): Sitting Oxygen Therapy SpO2: 98 % O2 Device: Not Delivered  See FIM for current functional status  Therapy/Group: Individual Therapy  Gilmore Laroche 10/13/2014, 2:54 PM

## 2014-10-13 NOTE — Progress Notes (Signed)
Physical Therapy Session Note  Patient Details  Name: Frank Simpson MRN: [id number] Date of Birth: 07-Mar-1931  Today's Date: 10/13/2014 PT Individual Time: 1000-1100 PT Individual Time Calculation (min): 60 min   Short Term Goals: Week 1:  PT Short Term Goal 1 (Week 1): Pt to perform bed mobility with supervision in a standard bed PT Short Term Goal 2 (Week 1): Pt to perform transfers with LRAD and supervision, 50% of time PT Short Term Goal 3 (Week 1): Pt to ambulate 300' with LRAD and supervision, 50% of time PT Short Term Goal 4 (Week 1): Pt to negotiate up/down 3 steps with single rail and supervision, 50% of time  Skilled Therapeutic Interventions/Progress Updates:   Pt received in restroom with wife assisting.  Note that wife not near pt and w/c was not locked prior to pt getting up.  Discussed whether pt and wife have been cleared by therapy to use restroom without staff.  Wife states, "no" but "he had to go really bad."  Questioned whether or not they called for help and wife again states "no."  Educated on unsafe situation and high risk for falling and that until cleared by therapy, they need to call and wait for assist from staff before getting up in room.  Also placed urinal by pt at end of session and educated that he could use this while seated to avoid needing to get up.  Wife and pt verbalized understanding.  Skilled session focused on gait downstairs in community environment and around gift shop to challenge alternating and divided attention, balance, and negotiation through tight spaces with RW for improved safety.   Wife present during whole session and was also provided education throughout.  Pt performed >300' gait with RW throughout session with min A and mod to max cues for keeping RW on ground, esp when making turns and safety when having to side step through tight spaces.  Performed furniture transfer downstairs from low seat at min A level.  Had lengthy discussion regarding  safety at home with wife and pt about 24/7 S/assist and need to use RW to decrease fall risk.  Pt able to state that he had many falls at home and wasn't using RW, also states that "I would use it, but it gets in the way, or I forget it."  Pt seems aware that he needs RW, however refuses to use at home.  Pt also stating that he would not likely listen to wife's cues at home for safety.  Ambulated back to rehab unit and attempted to have pt pathfind, however he requires max to total A verbal cues to get back to rehab floor and that his room is towards [place].  Pt then assisted to ADL apt in order to discuss fall recovery and perform fall risk.  Had lengthy discussion about reality of pt falling again at home and the need to know when to attempt floor transfer vs when not to perform and call 911 (educated wife as well).  Pt requires constant re-direction for recalling when to not try floor transfer.  Performed floor transfer with min A level with total A cuing and demonstration for how to get into floor.  Pt ambulated back to room and left in w/c with quick release belt donned.  Discussed with pt and wife that PT would communicate with primary PT about signing wife off for ambulation to/from restroom.   Therapy Documentation Precautions:  Precautions Precautions: Fall, ICD/Pacemaker Precaution Comments: Pacemaker precautions Required  Braces or Orthoses: Sling Other Brace/Splint: sling for L UE, light WB  Restrictions Weight Bearing Restrictions: Yes Other Position/Activity Restrictions: light WB through L UE due to pacemaker placement   Pain: Pt with no c/o pain during session.      See FIM for current functional status  Therapy/Group: Individual Therapy  Denice Bors 10/13/2014, 9:48 AM

## 2014-10-13 NOTE — Progress Notes (Signed)
[facility]     PROGRESS NOTE    Subjective/Complaints: Agitated again this am with me. "you're sending me to my grave"  ROS- limited secondary to reduced awareness denies pain Objective: Vital Signs: Blood pressure 135/61, pulse 62, temperature 97.7 F (36.5 C), temperature source Axillary, resp. rate 18, weight 89.676 kg (197 lb 11.2 oz), SpO2 96 %. No results found. No results for input(s): WBC, HGB, HCT, PLT in the last 72 hours. No results for input(s): NA, K, CL, GLUCOSE, BUN, CREATININE, CALCIUM in the last 72 hours.  Invalid input(s): CO CBG (last 3)   Recent Labs  10/12/14 1707 10/12/14 2011 10/13/14 0616  GLUCAP 330* 315* 300*    Wt Readings from Last 3 Encounters:  10/08/14 89.676 kg (197 lb 11.2 oz)  10/03/14 93.9 kg (207 lb 0.2 oz)  09/25/14 93.169 kg (205 lb 6.4 oz)    Physical Exam:  Constitutional: He is oriented to person, place, and time. Sitting in chair with WB in place. HENT: tongue dry/split in center Healing abrasion to the right scalp stable Eyes: EOM are normal.  Neck: Normal range of motion. Neck supple. No thyromegaly present.  Cardiovascular: Normal rate and regular rhythm. Pacemaker site clean and dry with steristrips Respiratory: Effort normal and breath sounds normal. No respiratory distress.  GI: Soft. Bowel sounds are normal. He exhibits no distension.  Neurological: He is alert and oriented to person, place, and time.  Confused,  irritable. [name] Kitchen He was able to provide his name, age date of birth. Knew he was at [facility]. Not oriented to month. Follows commands. Didn't know today's date. Mild right facial droop. [name]. LE: 4/5 proximal to distal. Decreased sensation to PP/LT in both feet to ankles.  Skin:  Abrasions on right face,scalp. Scattered bruises/lacs on arms legs.  Psychiatric:  Confused and agitated today. Thinks it's raining outside and that we want him to go out in  it  Assessment/Plan: 1. Functional deficits secondary to TBI which require 3+ hours per day of interdisciplinary therapy in a comprehensive inpatient rehab setting. Physiatrist is providing close team supervision and 24 hour management of active medical problems listed below. Physiatrist and rehab team continue to assess barriers to discharge/monitor patient progress toward functional and medical goals. FIM: FIM - Bathing Bathing Steps Patient Completed: Chest, Left upper leg, Right Arm, Left Arm, Abdomen, Front perineal area, Buttocks, Right upper leg Bathing: 4: Steadying assist  FIM - Upper Body Dressing/Undressing Upper body dressing/undressing steps patient completed: Thread/unthread left sleeve of pullover shirt/dress, Pull shirt over trunk, Put head through opening of pull over shirt/dress, Thread/unthread right sleeve of pullover shirt/dresss Upper body dressing/undressing: 5: Supervision: Safety issues/verbal cues FIM - Lower Body Dressing/Undressing Lower body dressing/undressing steps patient completed: Thread/unthread right underwear leg, Thread/unthread left underwear leg, Pull underwear up/down, Thread/unthread right pants leg, Don/Doff right sock, Fasten/unfasten pants, Pull pants up/down, Thread/unthread left pants leg, Don/Doff left sock, Don/Doff right shoe, Don/Doff left shoe, Fasten/unfasten right shoe, Fasten/unfasten left shoe Lower body dressing/undressing: 0: Activity did not occur  FIM - Toileting Toileting steps completed by patient: Performs perineal hygiene, Adjust clothing prior to toileting, Adjust clothing after toileting Toileting Assistive Devices: Grab bar or rail for support Toileting: 4: Steadying assist  FIM - Radio producer Devices: Grab bars, Insurance account manager Transfers: 5-To toilet/BSC: Supervision (verbal cues/safety issues), 5-From toilet/BSC: Supervision (verbal cues/safety issues)  FIM - Ship broker Devices: Walker, [employer] Bed/Chair Transfer:  4: Bed > Chair or W/C: Min A (steadying Pt. > 75%), 4: Chair or W/C > Bed: Min A (steadying Pt. > 75%)  FIM - Locomotion: Wheelchair Distance: 100' Locomotion: Wheelchair: 0: Activity did not occur FIM - Locomotion: Ambulation Locomotion: Ambulation Assistive Devices: Administrator Ambulation/Gait Assistance: 4: Min guard Locomotion: Ambulation: 4: Travels 150 ft or more with minimal assistance (Pt.>75%)  Comprehension Comprehension Mode: Auditory Comprehension: 3-Understands basic 50 - 74% of the time/requires cueing 25 - 50%  of the time  Expression Expression Mode: Verbal Expression: 5-Expresses basic 90% of the time/requires cueing < 10% of the time.  Social Interaction Social Interaction: 5-Interacts appropriately 90% of the time - Needs monitoring or encouragement for participation or interaction.  Problem Solving Problem Solving: 2-Solves basic 25 - 49% of the time - needs direction more than half the time to initiate, plan or complete simple activities  Memory Memory: 4-Recognizes or recalls 75 - 89% of the time/requires cueing 10 - 24% of the time  Medical Problem List and Plan: 1. Functional deficits secondary to Traumatic brain injury/scalp hematoma after a fall with history of gait disorder 2. DVT Prophylaxis/Anticoagulation: SCDs.   3. Pain Management: hydrocodone as needed. Monitor with increased mobility 4. Symptomatic bradycardia with complete heart block. Status post pacemaker placement 10/06/2014 per Dr. Crissie Sickles.Aspirin has been resumed.Brilinta not resumed per cardiology services 5. Neuropsych: This patient is not capable of making decisions on his own behalf.  -more agitated and confused at night. Episodic agitation during the day  -add am dose to scheduled seroquel hs.    -limit restraints as possible 6. Skin/Wound Care: routine skin checks monitoring of scalp abrasion 7.  Fluids/Electrolytes/Nutrition: strict I&O's. Follow-up chemistries. Provide nutritional supplements 8.Hypertension. Norvasc 5 mg daily,, Imdur 30 mg daily.Monitor with increased mobility, mainly am elevation, would check in sitting position, check orthostatics q am x 3 9.Diabetes mellitus with peripheral neuropathy. Sugars uncontrolled  -decreased lantus to 20units qhs---add am 10u dose starting today  -pt was also on scheduled mealtime insulin at home (won't add just yet) 10.BPH. pvr's low.   Flomax 0.4 mg daily   11.Mood/depression.Patient on Zoloft 25 mg daily prior to admission. Provide emotional support as appropriate 12.Hyperlipidemia. Crestor LOS (Days) 6 A FACE TO FACE EVALUATION WAS PERFORMED  Latona Krichbaum T 10/13/2014 8:38 AM

## 2014-10-13 NOTE — Progress Notes (Signed)
Occupational Therapy Session Note  Patient Details  Name: Frank Simpson MRN: [id number] Date of Birth: [birth year]  Today's Date: 10/13/2014 OT Individual Time: 1400-1500 OT Individual Time Calculation (min): 60 min    Short Term Goals: Week 1:  OT Short Term Goal 1 (Week 1): Pt consistently oriented x4 with environmental cues with supervision OT Short Term Goal 2 (Week 1): Pt will perform toileting with supervision OT Short Term Goal 3 (Week 1): Pt will obtain his own clothing with min cuing  OT Short Term Goal 4 (Week 1): Pt will stand to perform 3/3 grooming tasks with supervision   Skilled Therapeutic Interventions/Progress Updates:    Pt engaged in BADL retraining including bathing at shower level and dressing with sit<>stand from w/c. Pt not oriented during session and tangential during conversation.  Pt required min verbal cues for task initiation and mod verbal cues for safety awareness.  Pt amb with RW to and from bathroom.  Pt continued to stand in shower despite verbal cues to remain seated.  Focus on activity tolerance, sit<>stand, standing balance, and safety awareness.  Therapy Documentation Precautions:  Precautions Precautions: Fall, ICD/Pacemaker Precaution Comments: Pacemaker precautions Required Braces or Orthoses: Sling Other Brace/Splint: sling for L UE, light WB  Restrictions Weight Bearing Restrictions: Yes Other Position/Activity Restrictions: light WB through L UE due to pacemaker placement Pain: Pain Assessment Pain Assessment: No/denies pain ADL: ADL ADL Comments: see FIM  See FIM for current functional status  Therapy/Group: Individual Therapy  [name] 10/13/2014, 3:14 PM

## 2014-10-13 NOTE — Progress Notes (Signed)
Speech Language Pathology Daily Session Note  Patient Details  Name: Frank Simpson MRN: [id number] Date of Birth: 25-Apr-1931  Today's Date: 10/13/2014 SLP Individual Time: 0900-1000 SLP Individual Time Calculation (min): 60 min  Short Term Goals: Week 1: SLP Short Term Goal 1 (Week 1): Patient will orient to time, place and situation with supervision multimodal cues.  SLP Short Term Goal 2 (Week 1): Patient will identify 1 physical and 1 cognitive impairment with Mod A multimodal cues.  SLP Short Term Goal 3 (Week 1): Patient will utilize the call bell to request assistance with Max A multimodal cues.  SLP Short Term Goal 4 (Week 1): Patient will utilize schedule to recall new, daily information with Mod A multimodal cues.  SLP Short Term Goal 5 (Week 1): Patient will demonstrate selective attention to a task in a mildly distracting enviornment with for 30 minutes with Min A multimodal cues.  SLP Short Term Goal 6 (Week 1): Patient will demonstrate functional problem solving for basic and familiar tasks with Max A multimodal cues.   Skilled Therapeutic Interventions: Skilled treatment session focused on addressing cognitive goals.  Upon arrival, patient was supine in bed and appeared disoriented. Patient was reoriented to place and situation and was agreeable to participate in treatment session with Max encouragement.  SLP facilitated session with Min verbal cues to problem solve bed to chair transfer.  Patient required Mod multimodal cues to utilize external aids for orientation and to locate choices for next meal.  SLP also facilitated session with Max-Total assist for intellectual awareness of deficits and overall safety awareness throughout session.  Continue with current plan of care.   FIM:  Comprehension Comprehension Mode: Auditory Comprehension: 3-Understands basic 50 - 74% of the time/requires cueing 25 - 50%  of the time Expression Expression Mode: Verbal Expression:  5-Expresses basic 90% of the time/requires cueing < 10% of the time. Social Interaction Social Interaction: 5-Interacts appropriately 90% of the time - Needs monitoring or encouragement for participation or interaction. Problem Solving Problem Solving: 2-Solves basic 25 - 49% of the time - needs direction more than half the time to initiate, plan or complete simple activities Memory Memory: 4-Recognizes or recalls 75 - 89% of the time/requires cueing 10 - 24% of the time  Pain Pain Assessment Pain Assessment: No/denies pain  Therapy/Group: Individual Therapy  Jonathandavid Marlett, [place] 10/13/2014, 3:37 PM

## 2014-10-13 NOTE — Consult Note (Signed)
NEUROBEHAVIORAL STATUS EXAM - [facility]   MEDICAL NECESSITY:  Frank Simpson was seen on the [facility] for a neurobehavioral status exam owing to the patient's diagnosis of TBI, and to assist in treatment planning during admission.   According to medical records, Frank Simpson was admitted to the rehab unit owing to "Functional deficits secondary to traumatic brain injury/scalp hematoma after a fall with history of gait disorder." Records also indicate that he is an "80 y.o.right handed male with history of diabetes mellitus and peripheral neuropathy, diastolic congestive heart failure and coronary artery disease/ MI maintained on Brilinta and a baby aspirin prior to admission. Admitted 10/03/2014 with history of frequent falls. Patient independent with a cane prior to admission living with his wife. He does drive short distances. Most recently he was working in the yard and felt dizzy and fell striking his head on a tractor with loss of consciousness. He was intubated in the ED. Cranial CT scan showed atrophy with small vessel disease no acute intracranial abnormalities. There was a right frontal scalp hematoma. Follow-up cardiology services for bouts of bradycardia with hypotension with recurrent episodes of intermittent complete heart block while on the monitor. Underwent placement of pacemaker 10/06/2014 per Dr. Osie Cheeks."   During today's visit, Frank Simpson was accompanied by his wife Pamala Hurry) who assisted with the history. Patient denied suffering from any cognitive difficulties. His wife disagreed. She has noticed stable problems with memory following a heart attack that took place ~2 years ago. Regarding his recent TBI, Frank Simpson said that he remembers very little of the accident and he was knocked unconscious for most of the day following the event. PTA/RA was difficult to assess.   From an emotional standpoint, Mr.  Simpson described his mood as "pretty good." He has no history of mental health treatment. However, his wife notes him to be depressed and tearful at times, particularly when he is talking about his loss in functioning following the heart attack. She also observes him to be more easily irritable and agitated. No adjustment issues endorsed. Suicidal/homicidal ideation, plan or intent was denied. No manic or hypomanic episodes were reported. The patient denied ever experiencing any auditory/visual hallucinations. No major behavioral or personality changes were endorsed.   Frank Simpson described the rehab staff as "nice." He feels that he is making progress in therapy but he admittedly does not understand why they do some of the activities with him. His wife is his biggest source of support. No barriers to therapy identified.   PROCEDURES ADMINISTERED: [2 units [id number] on 10/13/14] Diagnostic clinical interview  Review of available records Montreal Cognitive Assessment (MoCA)  MENTAL STATUS: Frank Simpson mental status exam score of 6/29 (one task not administered) is WELL BELOW the cutoff used to indicate severe cognitive impairment and dementia. There were indications of executive dysfunction, memory loss, poor attention, impaired language, and deficient abstraction. He was also disoriented to year, month, and day.    Behavioral Evaluation: Frank Simpson was appropriately dressed for season and situation. Normal posture was noted. He was generally friendly and rapport was adequately established. His speech was as expected and he was able to express ideas effectively. He seemed to understand test directions readily. His affect was somewhat blunted and he teared up for a moment when speaking about his heart attack. Attention and motivation were adequate. Optimal test taking conditions were maintained.   IMPRESSION: Frank Simpson appears to be experiencing a severe degree of  cognitive impairment; at the level of dementia.  The exact etiology is not entirely clear, but it is likely multifactorial in nature and includes, but may not be limited to, traumatic brain and prior myocardial infarction. I also have concerns for a neurodegenerative condition such as Alzheimer's disease. Mild-to-moderate depressive symptoms have also purportedly been present for the past two years which could be exacerbating the situation but this is very unlikely contributing in any major way.    RECOMMENDATIONS  Recommendations for treatment team:  . Consider implementing a cholinesterase inhibitor for dementia, if not medically contraindicated with eventual supplementation with Namenda.  . Follow-up regarding his antidepressant. An increase in dose may be valuable.  . Follow-up brief cognitive testing as an outpatient is warranted to assess for interval change and assist in the management of the patient. At this time, 24 hour care and supervision is warranted. Patient should also refrain from driving and his wife should handle all ADLs. They may benefit from follow-up with their social worker to discuss the practical implications of his cognitive impairment and how they might set up the safest environment for him at home. I am also happy to follow-up at their behest.   DIAGNOSES:  Traumatic brain injury Dementia (etiology likely multifactorial) Depressive disorder, NOS    Rutha Bouchard, Psy.D.  Clinical Neuropsychologist

## 2014-10-14 ENCOUNTER — Encounter (HOSPITAL_COMMUNITY): Payer: Self-pay

## 2014-10-14 ENCOUNTER — Inpatient Hospital Stay (HOSPITAL_COMMUNITY): Payer: Self-pay

## 2014-10-14 ENCOUNTER — Inpatient Hospital Stay (HOSPITAL_COMMUNITY): Payer: Medicare Other

## 2014-10-14 ENCOUNTER — Inpatient Hospital Stay (HOSPITAL_COMMUNITY): Payer: Self-pay | Admitting: Speech Pathology

## 2014-10-14 LAB — GLUCOSE, CAPILLARY
GLUCOSE-CAPILLARY: 208 mg/dL — AB (ref 70–99)
Glucose-Capillary: 143 mg/dL — ABNORMAL HIGH (ref 70–99)
Glucose-Capillary: 251 mg/dL — ABNORMAL HIGH (ref 70–99)
Glucose-Capillary: 282 mg/dL — ABNORMAL HIGH (ref 70–99)

## 2014-10-14 MED ORDER — BISACODYL 10 MG RE SUPP
10.0000 mg | Freq: Every day | RECTAL | Status: DC | PRN
Start: 2014-10-14 — End: 2014-10-17
  Administered 2014-10-14: 10 mg via RECTAL
  Filled 2014-10-14: qty 1

## 2014-10-14 NOTE — Progress Notes (Signed)
Inpatient Diabetes Program Recommendations  AACE/ADA: New Consensus Statement on Inpatient Glycemic Control (2013)  Target Ranges:  Prepandial:   less than 140 mg/dL      Peak postprandial:   less than 180 mg/dL (1-2 hours)      Critically ill patients:  140 - 180 mg/dL   Results for Frank Simpson, Frank Simpson (MRN [id number]) as of 10/14/2014 07:15  Ref. Range 10/13/2014 06:16 10/13/2014 11:11 10/13/2014 16:48 10/13/2014 20:49 10/14/2014 06:35  Glucose-Capillary Latest Range: 70-99 mg/dL 300 (H) 263 (H) 292 (H) 375 (H) 143 (H)   Current orders for Inpatient glycemic control: Lantus 10 units daily, Lantus 20 units QHS, Novolog 0-15 units AC, Novolog 0-5 units HS  Inpatient Diabetes Program Recommendations Insulin - Meal Coverage: Post prandial glucose is consistently elevated. Please consider ordering Novolog 8 units TID with meals for meal coverage (in addition to Novolog correction).  Thanks, Barnie Alderman, RN, MSN, CCRN, CDE Diabetes Coordinator Inpatient Diabetes Program [phone] (Team Pager) [phone] ([place] office) [phone] [facility] office)

## 2014-10-14 NOTE — Progress Notes (Signed)
Physical Therapy Session Note  Patient Details  Name: Frank Simpson MRN: [id number] Date of Birth: 01/31/1931  Today's Date: 10/14/2014 PT Individual Time: Treatment Session 1: 1000-1100; Treatment Session 2: 1630-1700 PT Individual Time Calculation (min): Treatment Session 1: 60 min; Treatment Session 2: 66mn  Short Term Goals: Week 1:  PT Short Term Goal 1 (Week 1): Pt to perform bed mobility with supervision in a standard bed PT Short Term Goal 2 (Week 1): Pt to perform transfers with LRAD and supervision, 50% of time PT Short Term Goal 3 (Week 1): Pt to ambulate 300' with LRAD and supervision, 50% of time PT Short Term Goal 4 (Week 1): Pt to negotiate up/down 3 steps with single rail and supervision, 50% of time  Skilled Therapeutic Interventions/Progress Updates:  Treatment Session 1:  1:1. Pt received semi-reclined in bed, ready for therapy. Pt's wife and sister present. Focus this session on initiation of family training with pt's wife and overall safety during functional transfers and mobility. Pt req supervision for t/f sup<>sit EOB and steadying assist for donning clothings and toileting at start of session. Following demonstration by therapist, pt's wife assisting pt with bed mobility (supervision) on tx mat to simulate height of standard bed at home, transfers (furniture and car- supervision-min guard A) with emphasis on hand placement, stair negotiation (min guard A for negotiation up/down 8 steps w/ B UE on single rail and mod cues for step-to pattern). Therapist educating pt and wife on pt's need for 24hr close(S), use of RW for increased safety and functional independence vs. Use of SPC, fall prevention, home safety, use of urinal at night, goals of HH PT. Both verbalized understanding of all education this session, will req additional family training session for increased safety. Pt req min-mod cues for safety overall, pt's wife req min correction for improved safety cues and for  proximity to pt. Pt req ,in A for safe completion of floor transfer with therapist, mod cues for seq and safety. Pt left sitting in w/c at end of session w/ all needs in reach, quick release belt in place and wife in room.   Treatment Session 2:  1:1. Pt received semi-reclined in bed awake and ready for therapy. Focus this session on safety during functional transfers and ambulation as well as therex. Pt req supervision for t/f sup>sit EOB and steadying assist to don shorts. Pt req cues to wait to stand up until RW was in front of him. Pt req close(S) for ambulation 175'x1 and 200'x1 with RW, cues for slightly decreased pace and to stay close to RW during turns for increased overall safety. Pt utilized NuStep for B LE strength and endurance, good tolerance to level [id number] with cues to keep pace in 50s. Pt left sitting in w/c at nurses station at end of session with quick release belt in place.   Therapy Documentation Precautions:  Precautions Precautions: Fall, ICD/Pacemaker Precaution Comments: Pacemaker precautions Required Braces or Orthoses: Sling Other Brace/Splint: sling for L UE, light WB  Restrictions Weight Bearing Restrictions: Yes Other Position/Activity Restrictions: light WB through L UE due to pacemaker placement  See FIM for current functional status  Therapy/Group: Individual Therapy  KiGilmore Laroche1/17/2015, 12:05 PM

## 2014-10-14 NOTE — Progress Notes (Signed)
[facility] PHYSICAL MEDICINE & REHABILITATION     PROGRESS NOTE    Subjective/Complaints: In good spirits today. Non agitated.  ROS- limited secondary to reduced awareness denies pain Objective: Vital Signs: Blood pressure 133/63, pulse 62, temperature 97.7 F (36.5 C), temperature source Oral, resp. rate 18, weight 89.676 kg (197 lb 11.2 oz), SpO2 96 %. No results found. No results for input(s): WBC, HGB, HCT, PLT in the last 72 hours. No results for input(s): NA, K, CL, GLUCOSE, BUN, CREATININE, CALCIUM in the last 72 hours.  Invalid input(s): CO CBG (last 3)   Recent Labs  10/13/14 1648 10/13/14 2049 10/14/14 0635  GLUCAP 292* 375* 143*    Wt Readings from Last 3 Encounters:  10/08/14 89.676 kg (197 lb 11.2 oz)  10/03/14 93.9 kg (207 lb 0.2 oz)  09/25/14 93.169 kg (205 lb 6.4 oz)    Physical Exam:  Constitutional: He is oriented to person, place, and time. Sitting in chair with WB in place. HENT: tongue dry/split in Simpson Healing abrasion to the right scalp stable Eyes: EOM are normal.  Neck: Normal range of motion. Neck supple. No thyromegaly present.  Cardiovascular: Normal rate and regular rhythm. Pacemaker site clean and dry with steristrips Respiratory: Effort normal and breath sounds normal. No respiratory distress.  GI: Soft. Bowel sounds are normal. He exhibits no distension.  Neurological: He is alert and oriented to person, place, and time.  Confused,  irritable. [name] Kitchen He was able to provide his name, age date of birth. Knew he was at [facility] Simpson. Not oriented to month. Follows commands. Didn'Simpson know today's date. Mild right facial droop. [name]. LE: 4/5 proximal to distal. Decreased sensation to PP/LT in both feet to ankles.  Skin:  Abrasions on right face,scalp. Scattered bruises/lacs on arms legs.  Psychiatric:  Pleasant and cooperative today  Assessment/Plan: 1. Functional deficits secondary to TBI which require 3+ hours per day of  interdisciplinary therapy in a comprehensive inpatient rehab setting. Physiatrist is providing close team supervision and 24 hour management of active medical problems listed below. Physiatrist and rehab team continue to assess barriers to discharge/monitor patient progress toward functional and medical goals. FIM: FIM - Bathing Bathing Steps Patient Completed: Chest, Right Arm, Left Arm, Abdomen, Front perineal area, Buttocks, Left lower leg (including foot), Right lower leg (including foot), Left upper leg, Right upper leg Bathing: 4: Steadying assist  FIM - Upper Body Dressing/Undressing Upper body dressing/undressing steps patient completed: Thread/unthread left sleeve of pullover shirt/dress, Pull shirt over trunk, Put head through opening of pull over shirt/dress, Thread/unthread right sleeve of pullover shirt/dresss Upper body dressing/undressing: 5: Supervision: Safety issues/verbal cues FIM - Lower Body Dressing/Undressing Lower body dressing/undressing steps patient completed: Thread/unthread right underwear leg, Thread/unthread left underwear leg, Pull underwear up/down, Thread/unthread right pants leg, Fasten/unfasten pants, Pull pants up/down, Thread/unthread left pants leg, Don/Doff right shoe, Don/Doff left shoe, Fasten/unfasten right shoe, Fasten/unfasten left shoe Lower body dressing/undressing: 4: Min-Patient completed 75 plus % of tasks  FIM - Toileting Toileting steps completed by patient: Performs perineal hygiene, Adjust clothing prior to toileting, Adjust clothing after toileting Toileting Assistive Devices: Grab bar or rail for support Toileting: 4: Steadying assist  FIM - Radio producer Devices: Grab bars, Insurance account manager Transfers: 5-To toilet/BSC: Supervision (verbal cues/safety issues), 5-From toilet/BSC: Supervision (verbal cues/safety issues)  FIM - Control and instrumentation engineer Devices: Walker, Arm rests Bed/Chair  Transfer: 4: Bed > Chair or W/C: Min A (steadying Pt. > 75%), 4:  Chair or W/C > Bed: Min A (steadying Pt. > 75%), 5: Sit > Supine: Supervision (verbal cues/safety issues)  FIM - Locomotion: Wheelchair Distance: 100' Locomotion: Wheelchair: 0: Activity did not occur FIM - Locomotion: Ambulation Locomotion: Ambulation Assistive Devices: Administrator Ambulation/Gait Assistance: 4: Min guard Locomotion: Ambulation: 4: Travels 150 ft or more with minimal assistance (Pt.>75%)  Comprehension Comprehension Mode: Auditory Comprehension: 3-Understands basic 50 - 74% of the time/requires cueing 25 - 50%  of the time  Expression Expression Mode: Verbal Expression: 5-Expresses basic 90% of the time/requires cueing < 10% of the time.  Social Interaction Social Interaction: 5-Interacts appropriately 90% of the time - Needs monitoring or encouragement for participation or interaction.  Problem Solving Problem Solving: 2-Solves basic 25 - 49% of the time - needs direction more than half the time to initiate, plan or complete simple activities  Memory Memory: 4-Recognizes or recalls 75 - 89% of the time/requires cueing 10 - 24% of the time  Medical Problem List and Plan: 1. Functional deficits secondary to Traumatic brain injury/scalp hematoma after a fall with history of gait disorder 2. DVT Prophylaxis/Anticoagulation: SCDs.   3. Pain Management: hydrocodone as needed. Monitor with increased mobility 4. Symptomatic bradycardia with complete heart block. Status post pacemaker placement 10/06/2014 per Frank Simpson.Aspirin has been resumed.Brilinta not resumed per cardiology services 5. Neuropsych: This patient is not capable of making decisions on his own behalf.  -episodic agitation  -add am dose to scheduled seroquel hs.    -limit restraints as possible 6. Skin/Wound Care: routine skin checks monitoring of scalp abrasion 7. Fluids/Electrolytes/Nutrition: strict I&O's. Follow-up  chemistries. Provide nutritional supplements 8.Hypertension. Norvasc 5 mg daily,, Imdur 30 mg daily.Monitor with increased mobility, mainly am elevation, would check in sitting position, check orthostatics q am x 3 9.Diabetes mellitus with peripheral neuropathy. Sugars uncontrolled  -decreased lantus to 20units qhs---added am 10u dose   -pt was also on scheduled mealtime insulin at home (won'Simpson add just yet) 10.BPH. pvr's low.   Flomax 0.4 mg daily   11.Mood/depression.Patient on Zoloft 25 mg daily prior to admission. Provide emotional support as appropriate 12.Hyperlipidemia. Crestor LOS (Days) 7 A FACE TO FACE EVALUATION WAS PERFORMED  Frank Simpson 10/14/2014 8:14 AM

## 2014-10-14 NOTE — Progress Notes (Signed)
Occupational Therapy Session Note  Patient Details  Name: Frank Simpson MRN: [id number] Date of Birth: 1931/09/02  Today's Date: 10/14/2014 OT Individual Time: 1100-1200 OT Individual Time Calculation (min): 60 min    Short Term Goals: Week 1:  OT Short Term Goal 1 (Week 1): Pt consistently oriented x4 with environmental cues with supervision OT Short Term Goal 2 (Week 1): Pt will perform toileting with supervision OT Short Term Goal 3 (Week 1): Pt will obtain his own clothing with min cuing  OT Short Term Goal 4 (Week 1): Pt will stand to perform 3/3 grooming tasks with supervision   Skilled Therapeutic Interventions/Progress Updates:    Pt engaged in BADL retraining including bathing at shower level and dressing with sit<>stand from w/c.  Pt amb with RW to bathroom for shower.  Pt preferred to stand for shower but exhibited LOB X 2 during shower and he was persuaded to sit for remainder of shower.  Pt required assistance with donning socks.  Pt states that donning socks is easier at home because his chair is "lower to the ground." Pt stood at sink to complete grooming tasks (brushing teeth and shaving). Pt continues to require min verbal cues for safety awareness. Focus on activity tolerance, dynamic standing balance, functional amb with RW, and safety awareness.  Therapy Documentation Precautions:  Precautions Precautions: Fall, ICD/Pacemaker Precaution Comments: Pacemaker precautions Required Braces or Orthoses: Sling Other Brace/Splint: sling for L UE, light WB  Restrictions Weight Bearing Restrictions: Yes Other Position/Activity Restrictions: light WB through L UE due to pacemaker placement   Pain: Pain Assessment Pain Assessment: No/denies pain ADL: ADL ADL Comments: see FIM  See FIM for current functional status  Therapy/Group: Individual Therapy  [name] 10/14/2014, 12:10 PM

## 2014-10-14 NOTE — Progress Notes (Signed)
Occupational Therapy Note  Patient Details  Name: Frank Simpson MRN: [id number] Date of Birth: 1931/10/11  Today's Date: 10/14/2014 OT Individual Time: 1300-1400 OT Individual Time Calculation (min): 60 min   Pt denied pain Individual therapy  Pt's wife present for therapy session and provided appropriate supervision during session.  Pt engaged in functional amb for simple home mgmt tasks.  Focus on RW safety.  Pt continues to require min verbal cues for safety awareness.  Pt practiced tub bench transfers at supervision level.  Pt required min verbal cues for RW mgmt when preparing to sit on tub bench.  Pt also practiced ambulating in cluttered environment and required min verbal cues for side stepping and navigating around obstacles.     [name] [facility] 10/14/2014, 2:56 PM

## 2014-10-14 NOTE — Progress Notes (Signed)
Speech Language Pathology Daily Session Note  Patient Details  Name: DANEL REQUENA MRN: [id number] Date of Birth: 1931-11-28  Today's Date: 10/14/2014 SLP Individual Time: 0800-0900 SLP Individual Time Calculation (min): 60 min  Short Term Goals: Week 1: SLP Short Term Goal 1 (Week 1): Patient will orient to time, place and situation with supervision multimodal cues.  SLP Short Term Goal 2 (Week 1): Patient will identify 1 physical and 1 cognitive impairment with Mod A multimodal cues.  SLP Short Term Goal 3 (Week 1): Patient will utilize the call bell to request assistance with Max A multimodal cues.  SLP Short Term Goal 4 (Week 1): Patient will utilize schedule to recall new, daily information with Mod A multimodal cues.  SLP Short Term Goal 5 (Week 1): Patient will demonstrate selective attention to a task in a mildly distracting enviornment with for 30 minutes with Min A multimodal cues.  SLP Short Term Goal 6 (Week 1): Patient will demonstrate functional problem solving for basic and familiar tasks with Max A multimodal cues.   Skilled Therapeutic Interventions:  Pt was seen for skilled ST targeting cognitive goals.  Upon arrival, pt was seated upright in bed, consuming a container of milk; pt reported that he had already eaten breakfast prior to therapist's arrival.  Pt was awake, alert, and agreeable to participate in ST.  SLP facilitated the session with supervision cues for safety during completion of basic ADL tasks such as brushing his teeth, washing his face, and using the bathroom.  Pt's wife arrived shortly after beginning of session and remained actively engaged throughout all therapeutic tasks.  SLP further facilitated the session with a basic sequencing task targeting planning, organization, and error awareness.  Pt required overall mod assist to complete the abovementioned task due to decreased working memory and decreased selective attention to task in the presence of mild  environmental distractions.  Pt also required min-mod assist to identify problems in pictures and then generate solutions to the targeted problems during a structured safety awareness activity.  Pt is making progress towards meeting current goals.  Continue per current plan of care.    FIM:  Comprehension Comprehension Mode: Auditory Comprehension: 3-Understands basic 50 - 74% of the time/requires cueing 25 - 50%  of the time Expression Expression Mode: Verbal Expression: 5-Expresses basic 90% of the time/requires cueing < 10% of the time. Social Interaction Social Interaction: 5-Interacts appropriately 90% of the time - Needs monitoring or encouragement for participation or interaction. Problem Solving Problem Solving: 3-Solves basic 50 - 74% of the time/requires cueing 25 - 49% of the time Memory Memory: 4-Recognizes or recalls 75 - 89% of the time/requires cueing 10 - 24% of the time  Pain Pain Assessment Pain Assessment: No/denies pain  Therapy/Group: Individual Therapy   Windell Moulding, M.A. CCC-SLP  Tuan Tippin, Selinda Orion 10/14/2014, 10:26 AM

## 2014-10-14 NOTE — Plan of Care (Signed)
Problem: RH BLADDER ELIMINATION Goal: RH STG MANAGE BLADDER WITH ASSISTANCE STG Manage Bladder With minimal Assistance  Outcome: Progressing  Problem: RH SKIN INTEGRITY Goal: RH STG SKIN FREE OF INFECTION/BREAKDOWN Remain free from infection and skin breakdown with minimal assist  Outcome: Progressing  Problem: RH SAFETY Goal: RH STG ADHERE TO SAFETY PRECAUTIONS W/ASSISTANCE/DEVICE STG Adhere to Safety Precautions With \Mod I Assistance/Device.  Outcome: Progressing

## 2014-10-15 ENCOUNTER — Inpatient Hospital Stay (HOSPITAL_COMMUNITY): Payer: Medicare Other

## 2014-10-15 ENCOUNTER — Inpatient Hospital Stay (HOSPITAL_COMMUNITY): Payer: Self-pay

## 2014-10-15 ENCOUNTER — Encounter (HOSPITAL_COMMUNITY): Payer: Self-pay

## 2014-10-15 ENCOUNTER — Inpatient Hospital Stay (HOSPITAL_COMMUNITY): Payer: Medicare Other | Admitting: Speech Pathology

## 2014-10-15 LAB — GLUCOSE, CAPILLARY
GLUCOSE-CAPILLARY: 148 mg/dL — AB (ref 70–99)
GLUCOSE-CAPILLARY: 170 mg/dL — AB (ref 70–99)
Glucose-Capillary: 271 mg/dL — ABNORMAL HIGH (ref 70–99)
Glucose-Capillary: 231 mg/dL — ABNORMAL HIGH (ref 70–99)

## 2014-10-15 MED ORDER — INSULIN GLARGINE 100 UNIT/ML ~~LOC~~ SOLN
20.0000 [IU] | Freq: Every day | SUBCUTANEOUS | Status: DC
  Administered 2014-10-16 – 2014-10-17 (×2): 20 [IU] via SUBCUTANEOUS
  Filled 2014-10-15 (×2): qty 0.2

## 2014-10-15 NOTE — Progress Notes (Signed)
Occupational Therapy Note  Patient Details  Name: Frank Simpson MRN: [id number] Date of Birth: 02-27-1931  Today's Date: 10/15/2014 OT Individual Time: 1300-1400 OT Individual Time Calculation (min): 60 min   Pt denied pain Individual Therapy  Pt's wife present for family education.  Pt's wife provided appropriate supervision and verbal cues throughout session.  Pt practiced tub bench transfers, toilet transfers, functional amb with RW, and bed mobility.  Discussed safety in the home and continued discharge planning.   [name] [facility] 10/15/2014, 2:52 PM

## 2014-10-15 NOTE — Progress Notes (Signed)
Speech Language Pathology Daily Session Note  Patient Details  Name: ULES MARSALA MRN: [id number] Date of Birth: [birth year]  Today's Date: 10/15/2014 SLP Individual Time: 1000-1100 SLP Individual Time Calculation (min): 60 min  Short Term Goals: Week 1: SLP Short Term Goal 1 (Week 1): Patient will orient to time, place and situation with supervision multimodal cues.  SLP Short Term Goal 2 (Week 1): Patient will identify 1 physical and 1 cognitive impairment with Mod A multimodal cues.  SLP Short Term Goal 3 (Week 1): Patient will utilize the call bell to request assistance with Max A multimodal cues.  SLP Short Term Goal 4 (Week 1): Patient will utilize schedule to recall new, daily information with Mod A multimodal cues.  SLP Short Term Goal 5 (Week 1): Patient will demonstrate selective attention to a task in a mildly distracting enviornment with for 30 minutes with Min A multimodal cues.  SLP Short Term Goal 6 (Week 1): Patient will demonstrate functional problem solving for basic and familiar tasks with Max A multimodal cues.   Skilled Therapeutic Interventions: Skilled treatment session focused on cognitive goals. Upon arrival, patient was sitting upright in his wheelchair and was agreeable to participate in therapy session. SLP facilitated session by providing Min A question cues for recall of events from previous therapy sessions.  Patient was independently oriented to place and situation but required Min A multimodal cues for orientation to date.  Patient participated in a functional conversation with focus on anticipatory awareness and d/c planning. Patient required Mod A multimodal cues for insight/awareness into need for 24 hour supervision and use of walker to increase balance and safety. Patient's wife present for part of session and educated on importance of 24 hour supervision and need for implementing a schedule/routine at home to increase carryover. Patient's wife also reported  she feels the patient is at his cognitive baseline and will not require f/u SLP services at discharge. CSW made aware. Patient left in room with wife present. Continue with current plan of care.    FIM:  Comprehension Comprehension Mode: Auditory Comprehension: 3-Understands basic 50 - 74% of the time/requires cueing 25 - 50%  of the time Expression Expression Mode: Verbal Expression: 4-Expresses basic 75 - 89% of the time/requires cueing 10 - 24% of the time. Needs helper to occlude trach/needs to repeat words. Social Interaction Social Interaction: 4-Interacts appropriately 75 - 89% of the time - Needs redirection for appropriate language or to initiate interaction. Problem Solving Problem Solving: 3-Solves basic 50 - 74% of the time/requires cueing 25 - 49% of the time Memory Memory: 4-Recognizes or recalls 75 - 89% of the time/requires cueing 10 - 24% of the time  Pain Pain Assessment Pain Assessment: No/denies pain  Therapy/Group: Individual Therapy  Akshay Spang, [place] 10/15/2014, 3:23 PM

## 2014-10-15 NOTE — Progress Notes (Signed)
[place] PHYSICAL MEDICINE & REHABILITATION     PROGRESS NOTE    Subjective/Complaints: No new issues. Confused, agitated at times  ROS- limited secondary to reduced awareness denies pain Objective: Vital Signs: Blood pressure 131/70, pulse 74, temperature 97.8 F (36.6 C), temperature source Oral, resp. rate 18, weight 89.676 kg (197 lb 11.2 oz), SpO2 97 %. No results found. No results for input(s): WBC, HGB, HCT, PLT in the last 72 hours. No results for input(s): NA, K, CL, GLUCOSE, BUN, CREATININE, CALCIUM in the last 72 hours.  Invalid input(s): CO CBG (last 3)   Recent Labs  10/14/14 1628 10/14/14 2123 10/15/14 0638  GLUCAP 282* 208* 148*    Wt Readings from Last 3 Encounters:  10/08/14 89.676 kg (197 lb 11.2 oz)  10/03/14 93.9 kg (207 lb 0.2 oz)  09/25/14 93.169 kg (205 lb 6.4 oz)    Physical Exam:  Constitutional: He is oriented to person, place, and time. Sitting in chair with WB in place. HENT: tongue dry/split in center Healing abrasion to the right scalp stable Eyes: EOM are normal.  Neck: Normal range of motion. Neck supple. No thyromegaly present.  Cardiovascular: Normal rate and regular rhythm. Pacemaker site clean and dry with steristrips Respiratory: Effort normal and breath sounds normal. No respiratory distress.  GI: Soft. Bowel sounds are normal. He exhibits no distension.  Neurological: He is alert and oriented to person, place, and time.  Confused,  irritable. [name] Kitchen He was able to provide his name, age date of birth. Knew he was at [facility]. Not oriented to month. Follows commands. Didn'Simpson know today's date. Mild right facial droop. [name]. LE: 4/5 proximal to distal. Decreased sensation to PP/LT in both feet to ankles.  Skin:  Abrasions on right face,scalp. Scattered bruises/lacs on arms legs.  Psychiatric:  Pleasant and cooperative today  Assessment/Plan: 1. Functional deficits secondary to TBI which require 3+ hours per day of  interdisciplinary therapy in a comprehensive inpatient rehab setting. Physiatrist is providing close team supervision and 24 hour management of active medical problems listed below. Physiatrist and rehab team continue to assess barriers to discharge/monitor patient progress toward functional and medical goals. FIM: FIM - Bathing Bathing Steps Patient Completed: Chest, Right Arm, Left Arm, Abdomen, Front perineal area, Buttocks, Left lower leg (including foot), Right lower leg (including foot), Left upper leg, Right upper leg Bathing: 4: Steadying assist  FIM - Upper Body Dressing/Undressing Upper body dressing/undressing steps patient completed: Thread/unthread left sleeve of pullover shirt/dress, Pull shirt over trunk, Put head through opening of pull over shirt/dress, Thread/unthread right sleeve of pullover shirt/dresss Upper body dressing/undressing: 5: Supervision: Safety issues/verbal cues FIM - Lower Body Dressing/Undressing Lower body dressing/undressing steps patient completed: Thread/unthread right underwear leg, Thread/unthread left underwear leg, Pull underwear up/down, Thread/unthread right pants leg, Fasten/unfasten pants, Pull pants up/down, Thread/unthread left pants leg, Don/Doff right shoe, Don/Doff left shoe, Fasten/unfasten right shoe, Fasten/unfasten left shoe Lower body dressing/undressing: 4: Min-Patient completed 75 plus % of tasks  FIM - Toileting Toileting steps completed by patient: Performs perineal hygiene, Adjust clothing prior to toileting, Adjust clothing after toileting Toileting Assistive Devices: Grab bar or rail for support Toileting: 4: Steadying assist  FIM - Radio producer Devices: Grab bars, Insurance account manager Transfers: 5-To toilet/BSC: Supervision (verbal cues/safety issues), 5-From toilet/BSC: Supervision (verbal cues/safety issues)  FIM - Control and instrumentation engineer Devices: Walker, Arm rests Bed/Chair  Transfer: 5: Supine > Sit: Supervision (verbal cues/safety issues), 5: Sit > Supine:  Supervision (verbal cues/safety issues), 4: Bed > Chair or W/C: Min A (steadying Pt. > 75%), 4: Chair or W/C > Bed: Min A (steadying Pt. > 75%)  FIM - Locomotion: Wheelchair Distance: 100' Locomotion: Wheelchair: 0: Activity did not occur FIM - Locomotion: Ambulation Locomotion: Ambulation Assistive Devices: Administrator Ambulation/Gait Assistance: 4: Min guard, 5: Supervision Locomotion: Ambulation: 4: Travels 150 ft or more with minimal assistance (Pt.>75%)  Comprehension Comprehension Mode: Auditory Comprehension: 3-Understands basic 50 - 74% of the time/requires cueing 25 - 50%  of the time  Expression Expression Mode: Verbal Expression: 4-Expresses basic 75 - 89% of the time/requires cueing 10 - 24% of the time. Needs helper to occlude trach/needs to repeat words.  Social Interaction Social Interaction: 5-Interacts appropriately 90% of the time - Needs monitoring or encouragement for participation or interaction.  Problem Solving Problem Solving: 3-Solves basic 50 - 74% of the time/requires cueing 25 - 49% of the time  Memory Memory: 4-Recognizes or recalls 75 - 89% of the time/requires cueing 10 - 24% of the time  Medical Problem List and Plan: 1. Functional deficits secondary to Traumatic brain injury/scalp hematoma after a fall with history of gait disorder 2. DVT Prophylaxis/Anticoagulation: SCDs.   3. Pain Management: hydrocodone as needed. Monitor with increased mobility 4. Symptomatic bradycardia with complete heart block. Status post pacemaker placement 10/06/2014 per Dr. Crissie Sickles.Aspirin has been resumed.Brilinta not resumed per cardiology services 5. Neuropsych: This patient is not capable of making decisions on his own behalf.  -episodic agitation  -add am dose to scheduled seroquel hs.    -limit restraints as possible  -baseline dementia likely 6. Skin/Wound Care: routine  skin checks monitoring of scalp abrasion 7. Fluids/Electrolytes/Nutrition: strict I&O's. Follow-up chemistries. Provide nutritional supplements 8.Hypertension. Norvasc 5 mg daily,, Imdur 30 mg daily.Monitor with increased mobility, mainly am elevation, would check in sitting position, check orthostatics q am x 3 9.Diabetes mellitus with peripheral neuropathy. Sugars uncontrolled  -decreased lantus to 20units qhs---increase am dose to  20u  -pt was also on scheduled mealtime insulin at home (won'Simpson add just yet) 10.BPH. pvr's low.   Flomax 0.4 mg daily   11.Mood/depression.Patient on Zoloft 25 mg daily prior to admission. Provide emotional support as appropriate 12.Hyperlipidemia. Crestor LOS (Days) 8 A FACE TO FACE EVALUATION WAS PERFORMED  Frank Simpson 10/15/2014 8:09 AM

## 2014-10-15 NOTE — Progress Notes (Signed)
Social Work Patient ID: Frank Simpson, male   DOB: 1931/08/18, 80 y.o.   MRN: [id number]   Have reviewed team conference with pt and wife.  Both aware that originally targeted d/c date was set for 11/21, however, they have requested team consider change to 11/20.  Therapies and MD are agreed with change.  Plan home with wife on 11/20 at supervision level.  Will arrange follow up as recommended.  Aerilynn Goin, LCSW

## 2014-10-15 NOTE — Plan of Care (Signed)
Problem: RH BOWEL ELIMINATION Goal: RH STG MANAGE BOWEL WITH ASSISTANCE STG Manage Bowel with minimal Assistance.  Outcome: Progressing  Problem: RH BLADDER ELIMINATION Goal: RH STG MANAGE BLADDER WITH ASSISTANCE STG Manage Bladder With minimal Assistance  Outcome: Progressing  Problem: RH SKIN INTEGRITY Goal: RH STG SKIN FREE OF INFECTION/BREAKDOWN Remain free from infection and skin breakdown with minimal assist  Outcome: Progressing  Problem: RH SAFETY Goal: RH STG ADHERE TO SAFETY PRECAUTIONS W/ASSISTANCE/DEVICE STG Adhere to Safety Precautions With \Mod I Assistance/Device.  Outcome: Progressing  Problem: RH KNOWLEDGE DEFICIT Goal: RH STG INCREASE KNOWLEDGE OF DIABETES Outcome: Progressing Goal: RH STG INCREASE KNOWLEDGE OF HYPERTENSION Outcome: Progressing

## 2014-10-15 NOTE — Progress Notes (Signed)
Inpatient Diabetes Program Recommendations  AACE/ADA: New Consensus Statement on Inpatient Glycemic Control (2013)  Target Ranges:  Prepandial:   less than 140 mg/dL      Peak postprandial:   less than 180 mg/dL (1-2 hours)      Critically ill patients:  140 - 180 mg/dL   Results for CALEL, PISARSKI (MRN [id number]) as of 10/15/2014 09:39  Ref. Range 10/14/2014 06:35 10/14/2014 11:58 10/14/2014 16:28 10/14/2014 21:23 10/15/2014 06:38  Glucose-Capillary Latest Range: 70-99 mg/dL 143 (H) 251 (H) 282 (H) 208 (H) 148 (H)   Results for DEVARIOUS, PAVEK (MRN [id number]) as of 10/15/2014 09:39  Ref. Range 10/13/2014 06:16 10/13/2014 11:11 10/13/2014 16:48 10/13/2014 20:49  Glucose-Capillary Latest Range: 70-99 mg/dL 300 (H) 263 (H) 292 (H) 375 (H)   Current orders for Inpatient glycemic control: Lantus 20 units daily (increased today to start tomorrow since Lantus 10 units has already been given today), Lantus 20 units QHS, Novolog 0-15 units AC, Novolog 0-5 units HS  Inpatient Diabetes Program Recommendations Insulin - Meal Coverage: Post prandial glucose is consistently elevated. Please consider ordering Novolog 8 units TID with meals for meal coverage (in addition to Novolog correction).  Note: Noted morning dose of Lantus was increased today to 20 units daily (to start on 10/16/14). Since post prandial glucose consistently elevated throughout the day it would be more beneficial to order meal coverage instead of increasing basal insulin. By increasing morning dose of basal insulin patient is at risk for hypoglycemia if meal skipped or not consumed. With meal coverage, it would not be given if less than 50% of meal is not eaten or if patient is NPO. Please consider decreasing morning dose of Lantus back down to 10 units daily and consider ordering Novolog 8 units TID with meals for meal coverage which will cover noted hyperglycemia after meal consumed.  Thanks, Barnie Alderman, RN, MSN, CCRN,  CDE Diabetes Coordinator Inpatient Diabetes Program [phone] (Team Pager) [phone] ([facility] office) [phone] [facility] office)

## 2014-10-15 NOTE — Progress Notes (Signed)
Physical Therapy Session Note  Patient Details  Name: Frank Simpson MRN: [id number] Date of Birth: [birth year]  Today's Date: 10/15/2014 PT Individual Time: 1128-1158 PT Individual Time Calculation (min): 30 min   Session 2 Time: 1530-1630 Time Calculation (min): 60 min  Short Term Goals: Week 1:  PT Short Term Goal 1 (Week 1): Pt to perform bed mobility with supervision in a standard bed PT Short Term Goal 2 (Week 1): Pt to perform transfers with LRAD and supervision, 50% of time PT Short Term Goal 3 (Week 1): Pt to ambulate 300' with LRAD and supervision, 50% of time PT Short Term Goal 4 (Week 1): Pt to negotiate up/down 3 steps with single rail and supervision, 50% of time  Skilled Therapeutic Interventions/Progress Updates:    Session 1: Pt received seated in w/c w/ wife present, agreeable to participate in therapy. Session focused on continued family training. Wife denied any questions from yesterday's session. Pt's wife demonstrated ability to safely assist patient with multiple sit<>stands, ambulation up to 150', negotiating up/down 2x3 stairs w/ BUE on one rail, cueing for step to pattern, car transfer. Wife required min cueing to remain close to patient and for her to provide feedback to patient while he is turning to maintain safety. Pt left seated in w/c w/ QRB on and all needs within reach.   Session 2: Pt received supine in bed, agreeable to participate in therapy. Session focused on community ambulation, standing balance, bilateral LE strengthening. Pt ambulated 300' to hospital lobby w/ RW and MinGuard-supervision, managed elevator buttons w/ min cueing. Ambulated 250' around community environment (hospital sidewalks) w/ MinGuard-supervision. Mod cueing for route finding back to rehab unit. Pt completed L5 10' on Nustep for bilateral LE strengthening, tolerated well. Completed x10 each of [place] exercises, seated LAQ, standing hip abduction and standing HS curls all w/ 3# ankle  weights, half-squats and heel/toe raises in standing. Pt ambulated back to room w/ MinGuard, mod cueing for RW management. Pt left supine in bed w/ bed alarm on and all needs within reach.    Therapy Documentation Precautions:  Precautions Precautions: Fall, ICD/Pacemaker Precaution Comments: Pacemaker precautions Required Braces or Orthoses: Sling Other Brace/Splint: sling for L UE, light WB  Restrictions Weight Bearing Restrictions: Yes Other Position/Activity Restrictions: light WB through L UE due to pacemaker placement Pain: Pain Assessment Pain Assessment: No/denies pain Pain Score: 0-No pain  See FIM for current functional status  Therapy/Group: Individual Therapy  Rada Hay  Rada Hay, PT, DPT 10/15/2014, 11:27 AM

## 2014-10-15 NOTE — Progress Notes (Signed)
Occupational Therapy Session Note  Patient Details  Name: Frank Simpson MRN: [id number] Date of Birth: 05/10/1931  Today's Date: 10/15/2014 OT Individual Time: 0800-0900 OT Individual Time Calculation (min): 60 min    Short Term Goals: Week 1:  OT Short Term Goal 1 (Week 1): Pt consistently oriented x4 with environmental cues with supervision OT Short Term Goal 2 (Week 1): Pt will perform toileting with supervision OT Short Term Goal 3 (Week 1): Pt will obtain his own clothing with min cuing  OT Short Term Goal 4 (Week 1): Pt will stand to perform 3/3 grooming tasks with supervision   Skilled Therapeutic Interventions/Progress Updates:    Pt engaged in BADL retraining including bathing at tub/shower level in ADL apartment and dressing with sit<>stand for chair.  Pt completed all tasks with supervision but did required steady A when getting out of tub.  Pt continues to require mod verbal cues for safety awareness.  Pt stood for approx 50% of shower and did sit on bench to bath BLE.  Pt was able to don socks and tie shoes this morning without assistance.  Focus on activity tolerance, sit<>stand, standing balance, tub bench transfers, functional amb with RW, and safety awareness.  Therapy Documentation Precautions:  Precautions Precautions: Fall, ICD/Pacemaker Precaution Comments: Pacemaker precautions Restrictions Weight Bearing Restrictions: Yes Other Position/Activity Restrictions: light WB through L UE due to pacemaker placement Pain: Pain Assessment Pain Assessment: No/denies pain Pain Score: 0-No pain ADL: ADL ADL Comments: see FIM  See FIM for current functional status  Therapy/Group: Individual Therapy  [name] 10/15/2014, 9:03 AM

## 2014-10-15 NOTE — Progress Notes (Deleted)
Social Work Patient ID: Frank Simpson, male   DOB: [birth year], 80 y.o.   MRN: [id number]  Lennart Pall, LCSW Social Worker Signed  Patient Care Conference 10/15/2014  3:09 PM    Expand All Collapse All   Inpatient RehabilitationTeam Conference and Plan of Care Update Date: 10/14/2014   Time: 3:00 PM     Patient Name: Frank Simpson       Medical Record Number: [id number]  Date of Birth: [birth year] Sex: Male         Room/Bed: 4W18C/4W18C-01 Payor Info: Payor: MEDICARE / Plan: MEDICARE PART A AND B / Product Type: *No Product type* /    Admitting Diagnosis: TBI  S P FALL AND PACER PLACEMENT  Admit Date/Time:  10/07/2014  3:32 PM Admission Comments: No comment available   Primary Diagnosis:  <principal problem not specified> Principal Problem: <principal problem not specified>    Patient Active Problem List     Diagnosis  Date Noted   .  Gait disorder  10/07/2014   .  Fall     .  Traumatic brain injury with loss of consciousness of 31 minutes to 59 minutes  10/03/2014   .  Syncope  10/03/2014   .  Junctional bradycardia  10/03/2014   .  Benign positional vertigo  09/25/2014   .  Medicare annual wellness visit, initial  07/09/2014   .  Mood disorder  07/09/2014   .  B12 deficiency  06/11/2014   .  Fall  04/23/2014   .  Diabetes mellitus type 2 with retinopathy  02/19/2014   .  Rash of face  01/03/2014   .  Shoulder injury  07/10/2013   .  Right knee pain  07/10/2013   .  Memory loss  04/16/2013   .  Syncope  04/02/2013   .  Post herpetic neuralgia  02/26/2013   .  Unstable angina  10/15/2012   .  CAD (coronary artery disease)  10/15/2012   .  Acute on chronic systolic CHF (congestive heart failure)  10/15/2012   .  Benign hypertension with CKD (chronic kidney disease) stage III  10/15/2012   .  Thrombocytopenia     .  STEMI (ST elevation myocardial infarction)  08/06/2012   .  Osteoarthritis     .  CKD stage 3 due to type 2 diabetes mellitus     .  Urinary incontinence      .  Type 2 diabetes, uncontrolled, with neuropathy  07/18/2011   .  Hypercholesterolemia  07/18/2011     Expected Discharge Date: Expected Discharge Date: 10/17/14  Team Members Present: Physician leading conference: Dr. Alger Simons Social Worker Present: Lennart Pall, LCSW Nurse Present: Dwaine Gale, RN PT Present: Bridgett Ripa, Cottie Banda, PT OT Present: Roanna Epley, COTA;Jennifer Jolene Schimke, OT SLP Present: Weston Anna, SLP PPS Coordinator present : Daiva Nakayama, RN, CRRN        Current Status/Progress  Goal  Weekly Team Focus   Medical     TBI, ongoing cognitive deficits, baseline dementia   improve sleep, interactions with team   improve attention, sleep =wake   Bowel/Bladder               Swallow/Nutrition/ Hydration               ADL's     bating/dressing-steady A/close supervision; functional amb with RW-close supervision; decreased safety awareness  supervision overall  activity tolerance; family educaiton; standing balance  Mobility     Close(S)-min guard A overall with use of RW  Supervision overall with use of RW   funcitonal endurance, safety during funcitonal transfers and mobility, pt/family education, d/c planning   Communication               Safety/Cognition/ Behavioral Observations    Mod A   Min-Mod A   attention, awareness, problem solving, self-monitor and correcting errors    Pain               Skin                Rehab Goals Patient on target to meet rehab goals: Yes *See Care Plan and progress notes for long and short-term goals.    Barriers to Discharge:  baseline cognitive deficits     Possible Resolutions to Barriers:   family ed, supervision     Discharge Planning/Teaching Needs:   home with wife who can provide 24/7 supervision       Team Discussion:    Likely at baseline cognitively per input of wife.  Bowel issues.  Supervision goals.     Revisions to Treatment Plan:    None    Continued Need for  Acute Rehabilitation Level of Care: The patient requires daily medical management by a physician with specialized training in physical medicine and rehabilitation for the following conditions: Daily direction of a multidisciplinary physical rehabilitation program to ensure safe treatment while eliciting the highest outcome that is of practical value to the patient.: Yes Daily medical management of patient stability for increased activity during participation in an intensive rehabilitation regime.: Yes Daily analysis of laboratory values and/or radiology reports with any subsequent need for medication adjustment of medical intervention for : Neurological problems  Frank Simpson 10/15/2014, 3:09 PM

## 2014-10-15 NOTE — Plan of Care (Signed)
Problem: RH BOWEL ELIMINATION Goal: RH STG MANAGE BOWEL WITH ASSISTANCE STG Manage Bowel with minimal Assistance.  Outcome: Progressing  Problem: RH BLADDER ELIMINATION Goal: RH STG MANAGE BLADDER WITH ASSISTANCE STG Manage Bladder With minimal Assistance  Outcome: Progressing  Problem: RH SKIN INTEGRITY Goal: RH STG SKIN FREE OF INFECTION/BREAKDOWN Remain free from infection and skin breakdown with minimal assist  Outcome: Progressing  Problem: RH SAFETY Goal: RH STG ADHERE TO SAFETY PRECAUTIONS W/ASSISTANCE/DEVICE STG Adhere to Safety Precautions With \Mod I Assistance/Device.  Outcome: Progressing

## 2014-10-15 NOTE — Patient Care Conference (Addendum)
Inpatient RehabilitationTeam Conference and Plan of Care Update Date: 10/14/2014   Time: 3:00 PM    Patient Name: Frank Simpson      Medical Record Number: [id number]  Date of Birth: 1931/05/04 Sex: Male         Room/Bed: 4W18C/4W18C-01 Payor Info: Payor: MEDICARE / Plan: MEDICARE PART A AND B / Product Type: *No Product type* /    Admitting Diagnosis: TBI  S P FALL AND PACER PLACEMENT  Admit Date/Time:  10/07/2014  3:32 PM Admission Comments: No comment available   Primary Diagnosis:  <principal problem not specified> Principal Problem: <principal problem not specified>  Patient Active Problem List   Diagnosis Date Noted  . Gait disorder 10/07/2014  . Fall   . Traumatic brain injury with loss of consciousness of 31 minutes to 59 minutes 10/03/2014  . Syncope 10/03/2014  . Junctional bradycardia 10/03/2014  . Benign positional vertigo 09/25/2014  . Medicare annual wellness visit, initial 07/09/2014  . Mood disorder 07/09/2014  . B12 deficiency 06/11/2014  . Fall 04/23/2014  . Diabetes mellitus type 2 with retinopathy 02/19/2014  . Rash of face 01/03/2014  . Shoulder injury 07/10/2013  . Right knee pain 07/10/2013  . Memory loss 04/16/2013  . Syncope 04/02/2013  . Post herpetic neuralgia 02/26/2013  . Unstable angina 10/15/2012  . CAD (coronary artery disease) 10/15/2012  . Acute on chronic systolic CHF (congestive heart failure) 10/15/2012  . Benign hypertension with CKD (chronic kidney disease) stage III 10/15/2012  . Thrombocytopenia   . STEMI (ST elevation myocardial infarction) 08/06/2012  . Osteoarthritis   . CKD stage 3 due to type 2 diabetes mellitus   . Urinary incontinence   . Type 2 diabetes, uncontrolled, with neuropathy 07/18/2011  . Hypercholesterolemia 07/18/2011    Expected Discharge Date: Expected Discharge Date: 10/17/14  Team Members Present: Physician leading conference: Dr. Alger Simons Social Worker Present: Lennart Pall, LCSW Nurse Present:  Dwaine Gale, RN PT Present: Melene Plan, Cottie Banda, PT OT Present: Roanna Epley, COTA;Jennifer Jolene Schimke, OT SLP Present: Weston Anna, SLP PPS Coordinator present : Daiva Nakayama, RN, CRRN     Current Status/Progress Goal Weekly Team Focus  Medical   TBI, ongoing cognitive deficits, baseline dementia  improve sleep, interactions with team  improve attention, sleep =wake   Bowel/Bladder   continent of bowel and bladder with toileting, occassional incontinence  decrease episodes of incontinence      Swallow/Nutrition/ Hydration   CMM         ADL's   bathing/dressing - steady assist/close supervision; functional amb with RW-close supervision; decreased safety awareness  supervision overall  activity tolerance; family education; standing balance   Mobility   close (S)-min guard A overall with use of RW  Supervision overall with use of RW  functional endurance, safety during functional transfers and mobility,pt/family education, d/c planning   Communication   able to make his needs known         Safety/Cognition/ Behavioral Observations  no unsafe behaviors   no falls with injury  attention, awareness, problem solving, self-monitor and correcting errors    Pain   no complaint of pain  3 or less on scale of 1-10      Skin   healing abrasion to top of head, neosporin to area daily  no new skin breakdown       Rehab Goals Patient on target to meet rehab goals: Yes *See Care Plan and progress notes for long and short-term goals.  Barriers to Discharge: baseline cognitive deficits    Possible Resolutions to Barriers:  family ed, supervision    Discharge Planning/Teaching Needs:  home with wife who can provide 24/7 supervision      Team Discussion:  Likely at baseline cognitively per input of wife.  Bowel issues.  Supervision goals.    Revisions to Treatment Plan:  None   Continued Need for Acute Rehabilitation Level of Care: The patient requires daily  medical management by a physician with specialized training in physical medicine and rehabilitation for the following conditions: Daily direction of a multidisciplinary physical rehabilitation program to ensure safe treatment while eliciting the highest outcome that is of practical value to the patient.: Yes Daily medical management of patient stability for increased activity during participation in an intensive rehabilitation regime.: Yes Daily analysis of laboratory values and/or radiology reports with any subsequent need for medication adjustment of medical intervention for : Neurological problems  Frank Simpson 10/18/2014, 2:13 PM

## 2014-10-16 ENCOUNTER — Inpatient Hospital Stay (HOSPITAL_COMMUNITY): Payer: Medicare Other | Admitting: Speech Pathology

## 2014-10-16 ENCOUNTER — Inpatient Hospital Stay (HOSPITAL_COMMUNITY): Payer: Medicare Other | Admitting: Occupational Therapy

## 2014-10-16 ENCOUNTER — Inpatient Hospital Stay (HOSPITAL_COMMUNITY): Payer: Medicare Other

## 2014-10-16 LAB — GLUCOSE, CAPILLARY
Glucose-Capillary: 95 mg/dL (ref 70–99)
Glucose-Capillary: 261 mg/dL — ABNORMAL HIGH (ref 70–99)
Glucose-Capillary: 346 mg/dL — ABNORMAL HIGH (ref 70–99)
Glucose-Capillary: 168 mg/dL — ABNORMAL HIGH (ref 70–99)

## 2014-10-16 NOTE — Plan of Care (Signed)
Problem: RH BOWEL ELIMINATION Goal: RH STG MANAGE BOWEL WITH ASSISTANCE STG Manage Bowel with minimal Assistance.  Outcome: Progressing  Problem: RH BLADDER ELIMINATION Goal: RH STG MANAGE BLADDER WITH ASSISTANCE STG Manage Bladder With minimal Assistance  Outcome: Progressing  Problem: RH SKIN INTEGRITY Goal: RH STG SKIN FREE OF INFECTION/BREAKDOWN Remain free from infection and skin breakdown with minimal assist  Outcome: Progressing  Problem: RH SAFETY Goal: RH STG ADHERE TO SAFETY PRECAUTIONS W/ASSISTANCE/DEVICE STG Adhere to Safety Precautions With \Mod I Assistance/Device.  Outcome: Progressing

## 2014-10-16 NOTE — Progress Notes (Signed)
Occupational Therapy Session Note  Patient Details  Name: Frank Simpson MRN: [id number] Date of Birth: 1931/04/23  Today's Date: 10/16/2014 OT Individual Time: 8295-6213 OT Individual Time Calculation (min): 30 min    Short Term Goals: Week 1:  OT Short Term Goal 1 (Week 1): Pt consistently oriented x4 with environmental cues with supervision OT Short Term Goal 2 (Week 1): Pt will perform toileting with supervision OT Short Term Goal 3 (Week 1): Pt will obtain his own clothing with min cuing  OT Short Term Goal 4 (Week 1): Pt will stand to perform 3/3 grooming tasks with supervision   Skilled Therapeutic Interventions/Progress Updates:  Upon entering the room, pt seated in wheelchair with wife present in room. Pt with no c/o pain this session. Pt requiring increased time to donn B shoes for ambulation. STS from wheelchair height with supervision and use of arm rest. Pt ambulated 150+ feet to ADL apartment with use of RW with supervision - steady assist as pt begins to pick up speed with ambulation. Pt demonstrated transfer to TTB, sofa, and from standard height bed with supervision and use of RW. OT educated pt and caregiver on recommended purchase of steady strips to decrease fall risk in tub, OT also discussed energy conservation within the community such as how to prepare for a visit to see physician. Pt and wife verbalized understanding. Pt seated in wheelchair with call bell and all items within reach upon exiting the room.   Therapy Documentation Precautions:  Precautions Precautions: Fall, ICD/Pacemaker Precaution Comments: Pacemaker precautions Required Braces or Orthoses: Sling Other Brace/Splint: sling for L UE, light WB  Restrictions Weight Bearing Restrictions: Yes Other Position/Activity Restrictions: light WB through L UE due to pacemaker placement Pain: Pain Assessment Pain Assessment: No/denies pain Pain Score: 0-No pain Patients Stated Pain Goal: 3 Multiple Pain  Sites: No ADL: ADL ADL Comments: see FIM  See FIM for current functional status  Therapy/Group: Individual Therapy  Phineas Semen 10/16/2014, 12:27 PM

## 2014-10-16 NOTE — Progress Notes (Signed)
Physical Therapy Session Note  Patient Details  Name: Frank Simpson MRN: [id number] Date of Birth: 11-08-1931  Today's Date: 10/16/2014 PT Individual Time: 1410-1510 PT Individual Time Calculation (min): 60 min   Short Term Goals: Week 1:  PT Short Term Goal 1 (Week 1): Pt to perform bed mobility with supervision in a standard bed PT Short Term Goal 2 (Week 1): Pt to perform transfers with LRAD and supervision, 50% of time PT Short Term Goal 3 (Week 1): Pt to ambulate 300' with LRAD and supervision, 50% of time PT Short Term Goal 4 (Week 1): Pt to negotiate up/down 3 steps with single rail and supervision, 50% of time      Skilled Therapeutic Interventions/Progress Updates:  neuromuscular re-education via forced use, demo, VCs for closed chain R and L hip abduction in standing; , trunk shortening/lengthening/rotating with reaching L and R to facilitate balance reactions; [place] exs in standing: calf raises with L or R UE reach overhead, assist for balance; mini squats; L and R hamstring curls; toe raises, L and R hip abduction; 10 x 1 each.  RLE noted to rest in ER; pt stated this has been the case his entire adult life.   Gait x 150' with RW, close supervision, cues to slow down and stay on R side of hall as pt drifted L; cues for hand placement sit>< stand to RW. Backwards ambulation with RW through lane of cones, min guard assist.  Pt scanned for cones on L, but never spontaneously on R.    Pt stated he disliked using a RW and "didn't if he could help it".  Pt continues to have poor insight into deficits: he agreed that he "staggered" without the RW, but it "gets in his way".   Pt returned to room; transferred with supervision into bed.  Bed alarm set and all needs within reach.    Therapy Documentation Precautions:  Precautions Precautions: Fall, ICD/Pacemaker Precaution Comments: Pacemaker precautions Required Braces or Orthoses: Sling Other Brace/Splint: sling for L UE, light WB   Restrictions Weight Bearing Restrictions: No Other Position/Activity Restrictions: light WB through L UE due to pacemaker placement Pain: Pain Assessment Pain Assessment: No/denies pain   Locomotion : Ambulation Ambulation/Gait Assistance: 5: Supervision       See FIM for current functional status  Therapy/Group: Individual Therapy  Shana Zavaleta 10/16/2014, 3:28 PM

## 2014-10-16 NOTE — Progress Notes (Signed)
[facility] PHYSICAL MEDICINE & REHABILITATION     PROGRESS NOTE    Subjective/Complaints: No new issues. Confusion more at night and early mornings typically  ROS- limited secondary to reduced awareness denies pain Objective: Vital Signs: Blood pressure 148/69, pulse 70, temperature 97.9 F (36.6 C), temperature source Oral, resp. rate 18, weight 93.078 kg (205 lb 3.2 oz), SpO2 96 %. No results found. No results for input(s): WBC, HGB, HCT, PLT in the last 72 hours. No results for input(s): NA, K, CL, GLUCOSE, BUN, CREATININE, CALCIUM in the last 72 hours.  Invalid input(s): CO CBG (last 3)   Recent Labs  10/15/14 1641 10/15/14 2111 10/16/14 0646  GLUCAP 170* 231* 95    Wt Readings from Last 3 Encounters:  10/15/14 93.078 kg (205 lb 3.2 oz)  10/03/14 93.9 kg (207 lb 0.2 oz)  09/25/14 93.169 kg (205 lb 6.4 oz)    Physical Exam:  Constitutional: He is oriented to person, place, and time. Sitting in chair with WB in place. HENT: tongue dry/split in center Healing abrasion to the right scalp stable Eyes: EOM are normal.  Neck: Normal range of motion. Neck supple. No thyromegaly present.  Cardiovascular: Normal rate and regular rhythm. Pacemaker site clean and dry with steristrips Respiratory: Effort normal and breath sounds normal. No respiratory distress.  GI: Soft. Bowel sounds are normal. He exhibits no distension.  Neurological: He is alert and oriented to person, place, and time.  Confused,  irritable. [name] Kitchen He was able to provide his name, age date of birth. Knew he was at [facility]. Not oriented to month. Follows commands. Didn't know today's date. Mild right facial droop. [name]. LE: 4/5 proximal to distal. Decreased sensation to PP/LT in both feet to ankles.  Skin:  Abrasions on right face,scalp. Scattered bruises/lacs on arms legs.  Psychiatric:  Pleasant and cooperative today  Assessment/Plan: 1. Functional deficits secondary to TBI which require 3+  hours per day of interdisciplinary therapy in a comprehensive inpatient rehab setting. Physiatrist is providing close team supervision and 24 hour management of active medical problems listed below. Physiatrist and rehab team continue to assess barriers to discharge/monitor patient progress toward functional and medical goals. FIM: FIM - Bathing Bathing Steps Patient Completed: Chest, Right Arm, Left Arm, Abdomen, Front perineal area, Buttocks, Left lower leg (including foot), Right lower leg (including foot), Left upper leg, Right upper leg Bathing: 5: Supervision: Safety issues/verbal cues  FIM - Upper Body Dressing/Undressing Upper body dressing/undressing steps patient completed: Thread/unthread left sleeve of pullover shirt/dress, Pull shirt over trunk, Put head through opening of pull over shirt/dress, Thread/unthread right sleeve of pullover shirt/dresss Upper body dressing/undressing: 5: Supervision: Safety issues/verbal cues FIM - Lower Body Dressing/Undressing Lower body dressing/undressing steps patient completed: Thread/unthread right underwear leg, Thread/unthread left underwear leg, Pull underwear up/down, Thread/unthread right pants leg, Fasten/unfasten pants, Pull pants up/down, Thread/unthread left pants leg, Don/Doff right shoe, Don/Doff left shoe, Fasten/unfasten right shoe, Fasten/unfasten left shoe, Don/Doff right sock, Don/Doff left sock Lower body dressing/undressing: 5: Supervision: Safety issues/verbal cues  FIM - Toileting Toileting steps completed by patient: Adjust clothing prior to toileting, Performs perineal hygiene, Adjust clothing after toileting Toileting Assistive Devices: Grab bar or rail for support Toileting: 5: Set-up assist to: Obtain supplies  FIM - Radio producer Devices: Elevated toilet seat, Grab bars, Insurance account manager Transfers: 5-To toilet/BSC: Supervision (verbal cues/safety issues), 5-From toilet/BSC: Supervision (verbal  cues/safety issues)  FIM - Control and instrumentation engineer Devices: Walker, Arm  rests Bed/Chair Transfer: 5: Supine > Sit: Supervision (verbal cues/safety issues), 5: Sit > Supine: Supervision (verbal cues/safety issues), 4: Bed > Chair or W/C: Min A (steadying Pt. > 75%), 4: Chair or W/C > Bed: Min A (steadying Pt. > 75%)  FIM - Locomotion: Wheelchair Distance: 100' Locomotion: Wheelchair: 0: Activity did not occur FIM - Locomotion: Ambulation Locomotion: Ambulation Assistive Devices: Administrator Ambulation/Gait Assistance: 4: Min guard, 5: Supervision Locomotion: Ambulation: 4: Travels 150 ft or more with minimal assistance (Pt.>75%)  Comprehension Comprehension Mode: Auditory Comprehension: 3-Understands basic 50 - 74% of the time/requires cueing 25 - 50%  of the time  Expression Expression Mode: Verbal Expression: 4-Expresses basic 75 - 89% of the time/requires cueing 10 - 24% of the time. Needs helper to occlude trach/needs to repeat words.  Social Interaction Social Interaction: 4-Interacts appropriately 75 - 89% of the time - Needs redirection for appropriate language or to initiate interaction.  Problem Solving Problem Solving: 4-Solves basic 75 - 89% of the time/requires cueing 10 - 24% of the time  Memory Memory: 4-Recognizes or recalls 75 - 89% of the time/requires cueing 10 - 24% of the time  Medical Problem List and Plan: 1. Functional deficits secondary to Traumatic brain injury/scalp hematoma after a fall with history of gait disorder 2. DVT Prophylaxis/Anticoagulation: SCDs.   3. Pain Management: hydrocodone as needed. Monitor with increased mobility 4. Symptomatic bradycardia with complete heart block. Status post pacemaker placement 10/06/2014 per Dr. Crissie Sickles.Aspirin has been resumed.Brilinta not resumed per cardiology services 5. Neuropsych: This patient is not capable of making decisions on his own behalf.  -episodic  agitation  -added am dose to scheduled seroquel hs.    -limit restraints as possible  -baseline dementia  6. Skin/Wound Care: routine skin checks monitoring of scalp abrasion 7. Fluids/Electrolytes/Nutrition: strict I&O's. Follow-up chemistries. Provide nutritional supplements 8.Hypertension. Norvasc 5 mg daily,, Imdur 30 mg daily.Monitor with increased mobility, mainly am elevation, would check in sitting position, check orthostatics q am x 3 9.Diabetes mellitus with peripheral neuropathy. Sugars uncontrolled  -decreased lantus to 20units qhs---increased am dose to  20u---some improvement  -pt was also on scheduled mealtime insulin at home---resume once home  -consider  10.BPH. pvr's low.   Flomax 0.4 mg daily   11.Mood/depression.Patient on Zoloft 25 mg daily prior to admission. Provide emotional support as appropriate 12.Hyperlipidemia. Crestor LOS (Days) 9 A FACE TO FACE EVALUATION WAS PERFORMED  SWARTZ,ZACHARY T 10/16/2014 9:56 AM

## 2014-10-16 NOTE — Discharge Summary (Signed)
Discharge summary job [phone]

## 2014-10-16 NOTE — Discharge Summary (Signed)
NAMEJARYD, Frank Simpson NO.:  [id number]  MEDICAL RECORD NO.:  [id number]  LOCATION:  [id number]                        FACILITY:  [facility]  PHYSICIAN:  Frank Simpson, M.D.DATE OF BIRTH:  Nov 14, 1931  DATE OF ADMISSION:  10/07/2014 DATE OF DISCHARGE:  10/17/2014                              DISCHARGE SUMMARY   DISCHARGE DIAGNOSES: 1. Functional deficits secondary to traumatic brain injury, scalp     hematoma after fall. 2. Pain management. 3. Symptomatic bradycardia with complete heart block, status post     pacemaker on October 06, 2014. 4. Hypertension. 5. Diabetes mellitus, peripheral neuropathy. 6. Benign prostatic hypertrophy. 7. History of depression. 8. Hyperlipidemia.  HISTORY OF PRESENT ILLNESS:  This is an 80 year old right-handed male with a history of diabetes mellitus, diastolic congestive heart failure, coronary artery disease with myocardial infarction, maintained on Brilinta and baby aspirin prior to admission.  Admitted on October 03, 2014, with history of frequent falls.  The patient was independent with a cane prior to admission, living with his wife.  He does drive short distances.  Most recently, he was working in the yard, felt dizzy, fell, struck his head on a tractor with loss of consciousness.  He was intubated in the emergency department.  Cranial CT scan showed atrophy with small vessel disease.  No acute intracranial abnormalities.  There was a right frontal scalp hematoma.  CT of the cervical spine as well as abdomen and pelvis unremarkable.  Follow up Cardiology Services for bouts of bradycardia with hypotension with recurrent episodes of intermittent complete heart block while on the monitor.  He underwent placement of pacemaker on October 06, 2014, per Dr. Lovena Le. Echocardiogram with ejection fraction of 78%, grade 1 diastolic dysfunction.  The patient remained off Brilinta per Cardiology Services. Hospital course, extubated on  October 04, 2014, with therapies ongoing. The patient was admitted for comprehensive rehab program.  PAST MEDICAL HISTORY:  See discharge diagnoses.  SOCIAL HISTORY:  Lives with his wife.  FUNCTIONAL HISTORY:  Prior to admission with noted reported history of falls.  Functional status upon admission to rehab services was +2 physical assist, sit to stand; +2 physical assist, sit to supine; minimal assist, +2 physical assist to ambulate; min to mod assist for activities of daily living.  PHYSICAL EXAMINATION:  VITAL SIGNS:  Blood pressure 136/78, pulse 87, temperature 98, respirations 18. GENERAL:  This was an alert male, flat affect, confused, but not agitated.  He does make good eye contact with examiner.  He was able to provide his name, age, and date of birth.  He knew he was at [facility], recalled some of the events leading to his admission. LUNGS:  Clear to auscultation. CARDIAC:  Rate controlled. ABDOMEN:  Soft, nontender.  Good bowel sounds.  Pacemaker site clean and dry.  REHABILITATION HOSPITAL COURSE:  The patient was admitted to inpatient rehab services with therapies initiated on a 3-hour daily basis consisting of physical therapy, occupational therapy, speech therapy, and rehabilitation nursing.  The following issues were addressed during the patient's rehabilitation stay.  Pertaining to [name] functional deficits secondary to traumatic brain injury and scalp hematoma.  He continued to attend therapies.  He was somewhat restless, but not agitated.  Seroquel was added to his regimen at bedtime to help stabilize mood and sleep pattern.  Noted hospital course symptomatic bradycardia, complete heart block.  He had undergone pacemaker placement on October 06, 2014, per Dr. Lovena Le.  His aspirin had been resumed, but Brilinta prior to his admission remained on hold.  He would follow up with Cardiology Services.  He did continue to attend therapies. Question some  baseline dementia.  He initially had some restraints in place, later discontinued, monitoring safety.  Blood pressures remained well controlled, no orthostatic changes.  He would follow up with his primary MD.  He did have a history of diabetes mellitus, peripheral neuropathy.  Insulin therapy is advised with full diabetic teaching. His insulin was adjusted.  His mobility improved.  He did have a documented history of benign prostatic hypertrophy.  PVRs were good.  He remained on Flomax as advised.  Zoloft for history of depression as prior to admission.  The patient received weekly collaborative interdisciplinary team conferences to discuss estimated length of stay, family teaching, and any barriers to his discharge.  Wife attended full therapies, demonstrated good safety working with the patient as he was ambulating 150 feet, negotiating stairs, car transfers.  Wife required minimal cues during sessions for teaching.  His ambulation improved to 300 feet, ambulating to the hospital lobby using a rolling walker. Patient practiced tub bench transfers, toileting, functional ambulation with rolling walker during his activities of daily living.  The patient was independently oriented to place and situation, but required minimal assist cues for orientation to date.  He participated in a functional conversation with focus on anticipatory awareness and discharge planning.  It was advised the wife the need for 24-hour supervision for his safety which she was able to provide.  Ongoing home health physical and occupational therapy at time of discharge.  DISCHARGE MEDICATIONS: 1. Norvasc 5 mg p.o. daily. 2. Aspirin 81 mg p.o. daily. 3. Hydrocodone 1 to 2 tablets every 4 hours as needed, moderate pain,     dispense of 90 tablets. 4. Lantus insulin 20 units subcutaneous b.i.d. 5. Imdur 30 mg p.o. daily. 6. Nitroglycerin as needed. 7. Seroquel 25 mg at breakfast and 50 mg at bedtime. 8. Crestor 40  mg p.o. daily. 9. Zoloft 25 mg p.o. daily. 10.Flomax 0.4 mg p.o. daily.  DIET:  Diabetic diet.  SPECIAL INSTRUCTIONS:  The patient would follow up Dr. Alger Simons at the Outpatient [facility]hab [facility] office on December 02, 2013, arrival at 11:45 a.m.; Dr. Cristopher Peru, Cardiology Service, call for appointment; Dr. Georganna Skeans as needed; follow up with primary care provider, appointment to be made.  The patient should follow up with Cardiology Services in regard to plan of resuming Brilinta if needed.     Lauraine Rinne, P.A.   ______________________________ Frank Simpson, M.D.    DA/MEDQ  D:  10/16/2014  T:  10/16/2014  Job:  [id number]  cc:   Frank Simpson, M.D. Merri Ray Grandville Silos, M.D. Champ Mungo. Lovena Le, MD [name], MD

## 2014-10-16 NOTE — Progress Notes (Signed)
Speech Language Pathology Daily Session Notes  Patient Details  Name: DANZEL MARSZALEK MRN: [id number] Date of Birth: 08-14-1931  Today's Date: 10/16/2014  Session 1: SLP Individual Time: 0800-0900 SLP Individual Time Calculation (min): 60 min   Session 2: SLP Individual Time: 1330-1400 SLP Individual Time Calculation (min): 30 min  Short Term Goals: Week 1: SLP Short Term Goal 1 (Week 1): Patient will orient to time, place and situation with supervision multimodal cues.  SLP Short Term Goal 2 (Week 1): Patient will identify 1 physical and 1 cognitive impairment with Mod A multimodal cues.  SLP Short Term Goal 3 (Week 1): Patient will utilize the call bell to request assistance with Max A multimodal cues.  SLP Short Term Goal 4 (Week 1): Patient will utilize schedule to recall new, daily information with Mod A multimodal cues.  SLP Short Term Goal 5 (Week 1): Patient will demonstrate selective attention to a task in a mildly distracting enviornment with for 30 minutes with Min A multimodal cues.  SLP Short Term Goal 6 (Week 1): Patient will demonstrate functional problem solving for basic and familiar tasks with Max A multimodal cues.   Skilled Therapeutic Interventions:  Session 1: Skilled treatment session focused on cognitive goals. Upon arrival, patient was awake while supine in bed. SLP facilitated session by transferring the patient from the bed to wheelchair and providing supervision multimodal cues with extra time for functional problem solving with basic self-care tasks. Patient also participated in a new learning, mildly complex card task and initially required Mod-max A multimodal cues which faded to Min A by end of task for organization and problem solving throughout the task. Patient demonstrated appropriate sustained attention to all tasks and was independently oriented to place and situation and required Mod A multimodal cues for orientation to date. Patient also required total A  multimodal cues for anticipatory awareness in regards to appropriate and safe activities/tasks he can participate in at home. Wife present throughout 33 of session. Patient left in wheelchair with wife present. Continue with current plan of care.   Session 2: Skilled treatment session focused on cognitive goals. Upon arrival, patient was awake while upright in the wheelchair with wife present and was agreeable to participate in treatment session. SLP facilitated session by providing Max A multimodal cues for recall of events from previous therapy sessions and Mod A for orientation to date. Patient also participated in a functional conversation in regards to intellectual awareness of his physical and cognitive deficits and strategies to utilize at home to increase overall safety with Min A multimodal cues. Patient handed off to PT. Continue with current plan of care.     FIM:  Comprehension Comprehension Mode: Auditory Comprehension: 3-Understands basic 50 - 74% of the time/requires cueing 25 - 50%  of the time Expression Expression Mode: Verbal Expression: 4-Expresses basic 75 - 89% of the time/requires cueing 10 - 24% of the time. Needs helper to occlude trach/needs to repeat words. Social Interaction Social Interaction: 4-Interacts appropriately 75 - 89% of the time - Needs redirection for appropriate language or to initiate interaction. Problem Solving Problem Solving: 4-Solves basic 75 - 89% of the time/requires cueing 10 - 24% of the time Memory Memory: 4-Recognizes or recalls 75 - 89% of the time/requires cueing 10 - 24% of the time  Pain Pain Assessment Pain Assessment: No/denies pain  Therapy/Group: Individual Therapy  Ranelle Auker 10/16/2014, 9:35 AM   [place]on Anna, Rebecca, [place]

## 2014-10-16 NOTE — Progress Notes (Signed)
Occupational Therapy Session Note  Patient Details  Name: Frank Simpson MRN: [id number] Date of Birth: 23-Mar-1931  Today's Date: 10/16/2014 OT Individual Time: 1100-1200 OT Individual Time Calculation (min): 60 min    Short Term Goals: Week 1:  OT Short Term Goal 1 (Week 1): Pt consistently oriented x4 with environmental cues with supervision OT Short Term Goal 2 (Week 1): Pt will perform toileting with supervision OT Short Term Goal 3 (Week 1): Pt will obtain his own clothing with min cuing  OT Short Term Goal 4 (Week 1): Pt will stand to perform 3/3 grooming tasks with supervision   Skilled Therapeutic Interventions/Progress Updates:    Pt seen for ADL retraining with focus on family education, functional mobility, safety awareness, and activity tolerance. Pt receive sitting in w/c with wife present. Wife verbalized understanding of supervision level goals and was present during bathing and dressing, giving appropriate cues as needed. Pt ambulated to ADL apartment at supervision to complete bathing in tub shower. Utilized RW to prop LE on for LB dressing with improved comfort and less fatigue. Wife assisted with donning R sock this AM as pt had some difficulty. Returned to room and pt completed shaving from w/c level for safety. Pt left sitting in w/c with wife present. No questions about discharge at this time.   Therapy Documentation Precautions:  Precautions Precautions: Fall, ICD/Pacemaker Precaution Comments: Pacemaker precautions Required Braces or Orthoses: Sling Other Brace/Splint: sling for L UE, light WB  Restrictions Weight Bearing Restrictions: Yes Other Position/Activity Restrictions: light WB through L UE due to pacemaker placement General:   Vital Signs:  Pain: No report of pain  See FIM for current functional status  Therapy/Group: Individual Therapy  [name] 10/16/2014, 12:04 PM

## 2014-10-16 NOTE — Progress Notes (Signed)
Speech Language Pathology Daily Session Note  Patient Details  Name: Frank Simpson MRN: [id number] Date of Birth: December 01, 1930  Today's Date: 10/16/2014 SLP Individual Time: 1000-1030 SLP Individual Time Calculation (min): 30 min  Short Term Goals: Week 1: SLP Short Term Goal 1 (Week 1): Patient will orient to time, place and situation with supervision multimodal cues.  SLP Short Term Goal 2 (Week 1): Patient will identify 1 physical and 1 cognitive impairment with Mod A multimodal cues.  SLP Short Term Goal 3 (Week 1): Patient will utilize the call bell to request assistance with Max A multimodal cues.  SLP Short Term Goal 4 (Week 1): Patient will utilize schedule to recall new, daily information with Mod A multimodal cues.  SLP Short Term Goal 5 (Week 1): Patient will demonstrate selective attention to a task in a mildly distracting enviornment with for 30 minutes with Min A multimodal cues.  SLP Short Term Goal 6 (Week 1): Patient will demonstrate functional problem solving for basic and familiar tasks with Max A multimodal cues.   Skilled Therapeutic Interventions: Skilled treatment session focused on addressing cognitive-linguistic goals. Upon arrival, patient was sitting upright in his wheelchair with wife present and was agreeable to participate in therapy session. Student facilitated session by providing Min A multimodal cues for recall of sequencing to safely transfer from sit to stand with rolling walker. Patient was independently oriented to place and situation but required Min A multimodal cues for orientation to date. Student also facilitated session by providing Mod A multimodal cues for sustained attention and Max A multimodal cues for sequencing and problem solving during a mildly complex functional task. Patient required supervision question cues for route finding back to patient's room. Continue with current plan of care.   FIM:  Comprehension Comprehension Mode:  Auditory Comprehension: 3-Understands basic 50 - 74% of the time/requires cueing 25 - 50%  of the time Expression Expression Mode: Verbal Expression: 4-Expresses basic 75 - 89% of the time/requires cueing 10 - 24% of the time. Needs helper to occlude trach/needs to repeat words. Social Interaction Social Interaction: 4-Interacts appropriately 75 - 89% of the time - Needs redirection for appropriate language or to initiate interaction. Problem Solving Problem Solving: 3-Solves basic 50 - 74% of the time/requires cueing 25 - 49% of the time Memory Memory: 4-Recognizes or recalls 75 - 89% of the time/requires cueing 10 - 24% of the time  Pain Pain Assessment Pain Assessment: No/denies pain  Therapy/Group: Individual Therapy  Zelpha Messing 10/16/2014, 12:04 PM

## 2014-10-17 ENCOUNTER — Ambulatory Visit (HOSPITAL_COMMUNITY): Payer: Self-pay

## 2014-10-17 ENCOUNTER — Encounter (HOSPITAL_COMMUNITY): Payer: Self-pay | Admitting: Speech Pathology

## 2014-10-17 ENCOUNTER — Ambulatory Visit: Payer: Medicare Other

## 2014-10-17 ENCOUNTER — Encounter (HOSPITAL_COMMUNITY): Payer: Self-pay

## 2014-10-17 LAB — GLUCOSE, CAPILLARY: Glucose-Capillary: 83 mg/dL (ref 70–99)

## 2014-10-17 MED ORDER — AMLODIPINE BESYLATE 5 MG PO TABS: 5.0000 mg | ORAL_TABLET | Freq: Every day | ORAL | Status: DC

## 2014-10-17 MED ORDER — ROSUVASTATIN CALCIUM 40 MG PO TABS: 40.0000 mg | ORAL_TABLET | Freq: Every day | ORAL | Status: DC

## 2014-10-17 MED ORDER — ISOSORBIDE MONONITRATE ER 30 MG PO TB24: 30.0000 mg | ORAL_TABLET | Freq: Every day | ORAL | Status: DC

## 2014-10-17 MED ORDER — TAMSULOSIN HCL 0.4 MG PO CAPS: 0.4000 mg | ORAL_CAPSULE | Freq: Every day | ORAL | Status: DC

## 2014-10-17 MED ORDER — QUETIAPINE FUMARATE 25 MG PO TABS: ORAL_TABLET | ORAL | Status: DC

## 2014-10-17 MED ORDER — ASPIRIN 81 MG PO TABS: 81.0000 mg | ORAL_TABLET | Freq: Every day | ORAL | Status: DC

## 2014-10-17 MED ORDER — ASPIRIN 81 MG PO CHEW: 81.0000 mg | CHEWABLE_TABLET | Freq: Every day | ORAL | Status: DC

## 2014-10-17 MED ORDER — HYDROCODONE-ACETAMINOPHEN 5-325 MG PO TABS: 1.0000 | ORAL_TABLET | ORAL | Status: DC | PRN

## 2014-10-17 MED ORDER — SERTRALINE HCL 25 MG PO TABS: 25.0000 mg | ORAL_TABLET | Freq: Every day | ORAL | Status: DC

## 2014-10-17 MED ORDER — INSULIN GLARGINE 100 UNIT/ML ~~LOC~~ SOLN: SUBCUTANEOUS | Status: DC

## 2014-10-17 NOTE — Progress Notes (Signed)
Physical Therapy Discharge Summary  Patient Details  Name: Frank Simpson MRN: [id number] Date of Birth: August 24, 1931  Today's Date: 10/17/2014 PT Individual Time: 0800-0900 PT Individual Time Calculation (min): 60 min    Patient has met 14 of 14 long term goals due to improved activity tolerance, improved balance, improved postural control, increased strength, improved attention and improved awareness. Pt consistent with Rancho level VIII. Patient to discharge at an ambulatory level with RW and recommended 24hr Supervision.   Patient's wife participated in formal family education and is independent to provide the necessary physical and cognitive assistance at discharge.  Reasons goals not met: N/A  Recommendation:  Patient will benefit from ongoing skilled PT services in home health setting to continue to advance safe functional mobility, address ongoing impairments in decreased attention, decreased awareness, decreased memory, decreased balance, decreased functional endurance, decreased overall safety during functional transfers and mobility, and minimize fall risk.  Equipment: RW  Reasons for discharge: treatment goals met and discharge from hospital  Patient/family agrees with progress made and goals achieved: Yes  Skilled Therapeutic Interventions 1:1. Pt received semi-reclined in bed, ready for therapy. Focus this session on family education with pt's wife, overall safety during functional transfers and mobility as well as d/c planning. Pt's wife able to provide safe supervision/appropriate cueing for pt during ambulation >500' w/ RW (controlled, home and community environments, negotiation up/down 12 steps w/ B UE on single rail using step-to pattern, transfers (bed, furniture, floor and car).  Pt's standing balance reassessed with Merrilee Jansky Balance Test and DGI. Pt scored 36/56, demonstrating overall significant improvement from score at time of eval 29/56. DGI reassessed, pt scored  12/24 with use of RW, demonstrating improvement from score at time of eval 10/24 Despite improvements in objective test scores, pt continues to remain at high risk for fall. Pt and wife educated on results of tests, emphasized need for 24hr supervision as well as use of RW to decrease risk of falls.   Reviewed home safety recommendations (24hr close supervision, use of RW), fall prevention as well as goals/benefits of HH PT. Both pt and wife verbalized understanding of all education this session. Pt left sitting in w/c at end of session w/ all needs in reach, quick release belt in place and all needs in reach.   PT Discharge Precautions/Restrictions Precautions Precautions: Fall;ICD/Pacemaker Restrictions Weight Bearing Restrictions: No Vital Signs Therapy Vitals Temp: 97.6 F (36.4 C) Temp Source: Oral Pulse Rate: 78 Resp: 18 BP: (!) 143/75 mmHg Patient Position (if appropriate): Lying Oxygen Therapy SpO2: 98 % O2 Device: Not Delivered Pain Pain Assessment Pain Assessment: No/denies pain Vision/Perception  See OT eval  Cognition Overall Cognitive Status: History of cognitive impairments - at baseline Arousal/Alertness: Awake/alert Orientation Level: Oriented to person;Oriented to situation;Oriented to time;Disoriented to place Focused Attention: Appears intact Sustained Attention: Appears intact Selective Attention: Impaired Selective Attention Impairment: Verbal basic;Functional basic Memory: Impaired (baseline deficits) Memory Impairment: Decreased recall of new information;Decreased short term memory Decreased Short Term Memory: Verbal basic;Functional basic Awareness: Impaired Awareness Impairment: Emergent impairment Problem Solving: Impaired Problem Solving Impairment: Verbal basic;Functional basic Behaviors: Lability (at baseline) Safety/Judgment: Impaired Rancho [employer] Scales of Cognitive Functioning: Purposeful/appropriate Sensation Sensation Light Touch:  Appears Intact Proprioception: Appears Intact Coordination Gross Motor Movements are Fluid and Coordinated: Yes Fine Motor Movements are Fluid and Coordinated: Yes Motor  Motor Motor: Within Functional Limits  Mobility Bed Mobility Bed Mobility: Supine to Sit;Sit to Supine Supine to Sit: 5: Supervision Supine to Sit Details:  Verbal cues for precautions/safety Sit to Supine: 5: Supervision Sit to Supine - Details: Verbal cues for precautions/safety Transfers Transfers: Yes Sit to Stand: 5: Supervision Sit to Stand Details: Verbal cues for safe use of DME/AE Stand to Sit: 5: Supervision Stand to Sit Details (indicate cue type and reason): Verbal cues for safe use of DME/AE Stand Pivot Transfers: 5: Supervision Stand Pivot Transfer Details: Verbal cues for safe use of DME/AE Locomotion  Ambulation Ambulation: Yes Ambulation/Gait Assistance: 5: Supervision Ambulation Distance (Feet): 500 Feet Assistive device: Rolling walker Ambulation/Gait Assistance Details: Verbal cues for safe use of DME/AE Ambulation/Gait Assistance Details: cues for proximity to RW, posture Gait Gait: Yes Gait Pattern: Within Functional Limits (Pt with impaired gait at baseline- increased R knee flexion, trunk flexion) Stairs / Additional Locomotion Stairs: Yes Stairs Assistance: 5: Supervision Stairs Assistance Details: Verbal cues for precautions/safety;Verbal cues for gait pattern Stair Management Technique: One rail Left;Step to pattern;Sideways Number of Stairs: 12 Wheelchair Mobility Wheelchair Mobility: No (Pt at functional ambulatory level)  Trunk/Postural Assessment  Cervical Assessment Cervical Assessment: Within Functional Limits Thoracic Assessment Thoracic Assessment: Within Functional Limits Lumbar Assessment Lumbar Assessment: Within Functional Limits Postural Control Postural Control: Deficits on evaluation Righting Reactions: delayed Protective Responses: delayed   Balance Balance Balance Assessed: Yes Standardized Balance Assessment Standardized Balance Assessment: Berg Balance Test Berg Balance Test Sit to Stand: Able to stand without using hands and stabilize independently Standing Unsupported: Able to stand safely 2 minutes Sitting with Back Unsupported but Feet Supported on Floor or Stool: Able to sit safely and securely 2 minutes Stand to Sit: Sits safely with minimal use of hands Transfers: Able to transfer with verbal cueing and /or supervision Standing Unsupported with Eyes Closed: Able to stand 10 seconds with supervision Standing Ubsupported with Feet Together: Able to place feet together independently and stand for 1 minute with supervision From Standing, Reach Forward with Outstretched Arm: Can reach forward >12 cm safely (5") From Standing Position, Pick up Object from Floor: Able to pick up shoe, needs supervision From Standing Position, Turn to Look Behind Over each Shoulder: Turn sideways only but maintains balance Turn 360 Degrees: Needs close supervision or verbal cueing Standing Unsupported, Alternately Place Feet on Step/Stool: Able to complete >2 steps/needs minimal assist Standing Unsupported, One Foot in Front: Able to take small step independently and hold 30 seconds Standing on One Leg: Unable to try or needs assist to prevent fall Total Score: 36 Dynamic Gait Index Level Surface: Mild Impairment Change in Gait Speed: Mild Impairment Gait with Horizontal Head Turns: Moderate Impairment Gait with Vertical Head Turns: Moderate Impairment Gait and Pivot Turn: Mild Impairment Step Over Obstacle: Moderate Impairment Step Around Obstacles: Mild Impairment Steps: Moderate Impairment Total Score: 12 Static Sitting Balance Static Sitting - Balance Support: Feet supported;No upper extremity supported Static Sitting - Level of Assistance: 6: Modified independent (Device/Increase time) Dynamic Sitting Balance Dynamic Sitting -  Balance Support: Feet supported;Right upper extremity supported;Left upper extremity supported Dynamic Sitting - Level of Assistance: 6: Modified independent (Device/Increase time) Dynamic Sitting - Balance Activities: Lateral lean/weight shifting;Forward lean/weight shifting;Reaching for objects Static Standing Balance Static Standing - Balance Support: Right upper extremity supported;Left upper extremity supported;No upper extremity supported Static Standing - Level of Assistance: 5: Stand by assistance Dynamic Standing Balance Dynamic Standing - Balance Support: Left upper extremity supported;Right upper extremity supported;Bilateral upper extremity supported Dynamic Standing - Level of Assistance: 5: Stand by assistance Dynamic Standing - Balance Activities: Forward lean/weight shifting;Lateral lean/weight shifting;Reaching for  objects Extremity Assessment  RUE Assessment RUE Assessment: Within Functional Limits LUE Assessment LUE Assessment: Within Functional Limits RLE Assessment RLE Assessment: Exceptions to [facility] RLE AROM (degrees) RLE Overall AROM Comments: limited knee extension due to knee replacement RLE Strength RLE Overall Strength Comments: 4-/5. Pt reports decreased strength in this leg at baseline LLE Assessment LLE Assessment: Within Functional Limits (Grossly 4+/5)  See FIM for current functional status  Gilmore Laroche 10/17/2014, 12:20 PM

## 2014-10-17 NOTE — Progress Notes (Signed)
PHYSICAL MEDICINE & REHABILITATION     PROGRESS NOTE    Subjective/Complaints: No problems. Happy to be going home today  ROS- limited secondary to reduced awareness denies pain Objective: Vital Signs: Blood pressure 140/74, pulse 78, temperature 97.6 F (36.4 C), temperature source Oral, resp. rate 18, weight 93.078 kg (205 lb 3.2 oz), SpO2 98 %. No results found. No results for input(s): WBC, HGB, HCT, PLT in the last 72 hours. No results for input(s): NA, K, CL, GLUCOSE, BUN, CREATININE, CALCIUM in the last 72 hours.  Invalid input(s): CO CBG (last 3)   Recent Labs  10/16/14 1623 10/16/14 2043 10/17/14 0644  GLUCAP 346* 168* 83    Wt Readings from Last 3 Encounters:  10/15/14 93.078 kg (205 lb 3.2 oz)  10/03/14 93.9 kg (207 lb 0.2 oz)  09/25/14 93.169 kg (205 lb 6.4 oz)    Physical Exam:  Constitutional: He is oriented to person, place, and time. Sitting in chair with WB in place. HENT: tongue dry/split in center Healing abrasion to the right scalp stable Eyes: EOM are normal.  Neck: Normal range of motion. Neck supple. No thyromegaly present.  Cardiovascular: Normal rate and regular rhythm. Pacemaker site clean and dry with steristrips Respiratory: Effort normal and breath sounds normal. No respiratory distress.  GI: Soft. Bowel sounds are normal. He exhibits no distension.  Neurological: He is alert and oriented to person, place, and time.  Confused,  irritable. [name] Kitchen He was able to provide his name, age date of birth. Knew he was at [facility]. Not oriented to month. Follows commands. Didn't know today's date. Mild right facial droop. [name]. LE: 4/5 proximal to distal. Decreased sensation to PP/LT in both feet to ankles.  Skin:  Abrasions on right face,scalp. S .  Psychiatric:  Pleasant and cooperative today again  Assessment/Plan: 1. Functional deficits secondary to TBI which require 3+ hours per day of interdisciplinary therapy in a  comprehensive inpatient rehab setting. Physiatrist is providing close team supervision and 24 hour management of active medical problems listed below. Physiatrist and rehab team continue to assess barriers to discharge/monitor patient progress toward functional and medical goals.  Dc home today  FIM: FIM - Bathing Bathing Steps Patient Completed: Chest, Right Arm, Left Arm, Abdomen, Front perineal area, Buttocks, Left lower leg (including foot), Right lower leg (including foot), Left upper leg, Right upper leg Bathing: 5: Supervision: Safety issues/verbal cues  FIM - Upper Body Dressing/Undressing Upper body dressing/undressing steps patient completed: Thread/unthread left sleeve of pullover shirt/dress, Pull shirt over trunk, Put head through opening of pull over shirt/dress, Thread/unthread right sleeve of pullover shirt/dresss Upper body dressing/undressing: 5: Supervision: Safety issues/verbal cues FIM - Lower Body Dressing/Undressing Lower body dressing/undressing steps patient completed: Thread/unthread right underwear leg, Thread/unthread left underwear leg, Pull underwear up/down, Thread/unthread right pants leg, Fasten/unfasten pants, Pull pants up/down, Thread/unthread left pants leg, Don/Doff right shoe, Don/Doff left shoe, Fasten/unfasten right shoe, Fasten/unfasten left shoe, Don/Doff left sock Lower body dressing/undressing: 4: Min-Patient completed 75 plus % of tasks  FIM - Toileting Toileting steps completed by patient: Adjust clothing prior to toileting, Performs perineal hygiene, Adjust clothing after toileting Toileting Assistive Devices: Grab bar or rail for support Toileting: 5: Set-up assist to: Obtain supplies  FIM - Radio producer Devices: Elevated toilet seat, Grab bars, Insurance account manager Transfers: 5-To toilet/BSC: Supervision (verbal cues/safety issues), 5-From toilet/BSC: Supervision (verbal cues/safety issues)  FIM - Financial trader Devices: Walker, Arm  rests Bed/Chair Transfer: 5: Chair or W/C > Bed: Supervision (verbal cues/safety issues)  FIM - Locomotion: Wheelchair Distance: 100' Locomotion: Wheelchair: 1: Total Assistance/staff pushes wheelchair (Pt<25%) FIM - Locomotion: Ambulation Locomotion: Ambulation Assistive Devices: Administrator Ambulation/Gait Assistance: 5: Supervision Locomotion: Ambulation: 5: Travels 150 ft or more with supervision/safety issues  Comprehension Comprehension Mode: Auditory Comprehension: 3-Understands basic 50 - 74% of the time/requires cueing 25 - 50%  of the time  Expression Expression Mode: Verbal Expression: 4-Expresses basic 75 - 89% of the time/requires cueing 10 - 24% of the time. Needs helper to occlude trach/needs to repeat words.  Social Interaction Social Interaction: 4-Interacts appropriately 75 - 89% of the time - Needs redirection for appropriate language or to initiate interaction.  Problem Solving Problem Solving: 3-Solves basic 50 - 74% of the time/requires cueing 25 - 49% of the time  Memory Memory: 4-Recognizes or recalls 75 - 89% of the time/requires cueing 10 - 24% of the time  Medical Problem List and Plan: 1. Functional deficits secondary to Traumatic brain injury/scalp hematoma after a fall with history of gait disorder 2. DVT Prophylaxis/Anticoagulation: SCDs.   3. Pain Management: hydrocodone as needed. Monitor with increased mobility 4. Symptomatic bradycardia with complete heart block. Status post pacemaker placement 10/06/2014 per Dr. Crissie Sickles.Aspirin has been resumed.Brilinta not resumed per cardiology services 5. Neuropsych: This patient is not capable of making decisions on his own behalf.  -seroquel hs and prn for agitation at home. 6. Skin/Wound Care: wounds healing 7. Fluids/Electrolytes/Nutrition: strict I&O's. Follow-up chemistries. Provide nutritional supplements 8.Hypertension. Norvasc 5  mg daily,, Imdur 30 mg daily.Monitor with increased mobility, mainly am elevation, would check in sitting position, check orthostatics q am x 3 9.Diabetes mellitus with peripheral neuropathy. Sugars uncontrolled  -decreased lantus to 20units qhs---increased am dose to  20u---some improvement  -pt was also on scheduled mealtime insulin at home---resume once home  -consider  10.BPH. pvr's low.   Flomax 0.4 mg daily   11.Mood/depression.Patient on Zoloft 25 mg daily prior to admission. Provide emotional support as appropriate 12.Hyperlipidemia. Crestor LOS (Days) 10 A FACE TO FACE EVALUATION WAS PERFORMED  Frank Simpson T 10/17/2014 8:28 AM

## 2014-10-17 NOTE — Progress Notes (Signed)
Patient discharged to home, accompanied by his wife.

## 2014-10-17 NOTE — Discharge Instructions (Signed)
Inpatient Rehab Discharge Instructions  Frank Simpson Discharge date and time: No discharge date for patient encounter.   Activities/Precautions/ Functional Status: Activity: activity as tolerated Diet: diabetic diet Wound Care: none needed Functional status:  ___ No restrictions     ___ Walk up steps independently _x__ 24/7 supervision/assistance   ___ Walk up steps with assistance ___ Intermittent supervision/assistance  ___ Bathe/dress independently ___ Walk with walker     ___ Bathe/dress with assistance ___ Walk Independently    ___ Shower independently _x__ Walk with assistance    ___ Shower with assistance ___ No alcohol     ___ Return to work/school ________    COMMUNITY REFERRALS UPON DISCHARGE:    Home Health:   PT     OT                       Agency:  [facility] Phone: [phone]   Medical Equipment/Items Ordered: rolling walker                                                     Agency/Supplier: [facility] @ [phone]       Special Instructions:  Follow-up cardiology services Dr. Lovena Le on question plan to resume Brilinta as prior to admission  My questions have been answered and I understand these instructions. I will adhere to these goals and the provided educational materials after my discharge from the hospital.  Patient/Caregiver Signature _______________________________ Date __________  Clinician Signature _______________________________________ Date __________  Please bring this form and your medication list with you to all your follow-up doctor's appointments.

## 2014-10-17 NOTE — Plan of Care (Signed)
Problem: RH Cognition - SLP Goal: RH LTG Patient will demonstrate orientation with cues LTG: Patient will demonstrate orientation to person/place/time/situation with cues (SLP)  Outcome: Completed/Met Date Met:  10/17/14  Problem: RH Problem Solving Goal: LTG Patient will demonstrate problem solving for (SLP) LTG: Patient will demonstrate problem solving for basic/complex daily situations with cues (SLP)  Outcome: Completed/Met Date Met:  10/17/14  Problem: RH Memory Goal: LTG Patient will use memory compensatory aids to (SLP) LTG: Patient will use memory compensatory aids to recall biographical/new, daily complex information with cues (SLP)  Outcome: Completed/Met Date Met:  10/17/14  Problem: RH Attention Goal: LTG Patient will demonstrate focused/sustained (SLP) LTG: Patient will demonstrate focused/sustained/selective/alternating/divided attention during cognitive/linguistic activities in specific environment with assist for # of minutes (SLP)  Outcome: Completed/Met Date Met:  10/17/14  Problem: RH Awareness Goal: LTG: Patient will demonstrate intellectual/emergent (SLP) LTG: Patient will demonstrate intellectual/emergent/anticipatory awareness with assist during a cognitive/linguistic activity (SLP)  Outcome: Completed/Met Date Met:  10/17/14

## 2014-10-17 NOTE — Progress Notes (Signed)
Speech Language Pathology Session Note & Discharge Summary  Patient Details  Name: Frank Simpson MRN: [id number] Date of Birth: Aug 05, 1931  Today's Date: 10/17/2014 SLP Individual Time: 0900-0930 SLP Individual Time Calculation (min): 30 min   Skilled Therapeutic Interventions:  Skilled treatment session focused on cognitive goals and completing family education with the patient's wife. Patient's wife has been present throughout most of sessions and provides appropriate cues during functional tasks. Patient's wife reports that the patient is at his cognitive baseline, therefore, she feels comfortable to able to provide the necessary cognitive assistance the patient requires at this time. Patient was oriented to situation and date with Mod I but required Mod verbal cues for orientation to place. Patient was also able to follow 1 and 2 step commands with 100% accuracy and followed 3 step commands with 66% accuracy. Patient recalled details from a paragraph with 50% accuracy, suspect function impacted by baseline memory deficits, however, patient was able to independently recall the need for a walker and supervision from wife to ensure safety when ambulating at home. Overall, patient is demonstrating behaviors consistent with a Rancho Level VIII and is at his baseline level of cognitive function. Patient left in wheelchair in room with wife present.    Patient has met 5 of 5 long term goals.  Patient to discharge at overall Min;Mod level.   Reasons goals not met: N/A   Clinical Impression/Discharge Summary: Patient has made functional gains and has met 5 of 5 LTG's this admission due to increased attention, orientation, problem solving, awareness and working memory. Currently, patient demonstrates behaviors consistent with a Rancho Level VIII and requires Min A multimodal cues to perform functional and familiar tasks safely in regards to selective attention, intellectual awareness, functional problem  solving and safety and overall Mod A multimodal cues for recall of new information due to baseline memory deficits.  Patient/family education complete and both the patient and his wife report the patient is at his baseline level of cognitive function, therefore, f/u SLP services are not warranted at this time. Patient will discharge home with 24 hour supervision from family.   Care Partner:  Caregiver Able to Provide Assistance: Yes  Type of Caregiver Assistance: Physical;Cognitive  Recommendation:  24 hour supervision/assistance      Equipment: N/A   Reasons for discharge: Treatment goals met;Discharged from hospital   Patient/Family Agrees with Progress Made and Goals Achieved: Yes   See FIM for current functional status  Steele Stracener 10/17/2014, 9:37 AM

## 2014-10-17 NOTE — Progress Notes (Signed)
Occupational Therapy Session Note  Patient Details  Name: Frank Simpson MRN: [id number] Date of Birth: 02/04/1931  Today's Date: 10/17/2014 OT Individual Time: 1000-1100 OT Individual Time Calculation (min): 60 min    Short Term Goals: Week 1:  OT Short Term Goal 1 (Week 1): Pt consistently oriented x4 with environmental cues with supervision OT Short Term Goal 2 (Week 1): Pt will perform toileting with supervision OT Short Term Goal 3 (Week 1): Pt will obtain his own clothing with min cuing  OT Short Term Goal 4 (Week 1): Pt will stand to perform 3/3 grooming tasks with supervision   Skilled Therapeutic Interventions/Progress Updates:    Pt resting in w/c upon arrival with wife present.  Pt participated in therapy session and provided appropriate supervision throughout session.  Pt engaged in bathing, dressing, toileting, and grooming tasks.  Pt completed all tasks at supervision level with wife providing direction.  Wife verbalized understanding of recommendation for 24 hour supervision.  Discussed with patient the importance of listening to his wife and following her directions.  Pt verbalized understanding. Demonstrated for wife covering pacemaker site with plastic when showering. Pt's wife verbalized understanding.  Therapy Documentation Precautions:  Precautions Precautions: Fall, ICD/Pacemaker Precaution Comments: Pacemaker precautions Required Braces or Orthoses: Sling Other Brace/Splint: sling for L UE, light WB  Restrictions Weight Bearing Restrictions: No Other Position/Activity Restrictions: light WB through L UE due to pacemaker placement Pain: Pain Assessment Pain Assessment: No/denies pain ADL: ADL ADL Comments: see FIM  See FIM for current functional status  Therapy/Group: Individual Therapy  [name] 10/17/2014, 11:02 AM

## 2014-10-17 NOTE — Plan of Care (Signed)
Problem: RH Balance Goal: LTG Patient will maintain dynamic standing with ADLs (OT) LTG: Patient will maintain dynamic standing balance with assist during activities of daily living (OT)  Outcome: Completed/Met Date Met:  10/17/14  Problem: RH Grooming Goal: LTG Patient will perform grooming w/assist,cues/equip (OT) LTG: Patient will perform grooming with assist, with/without cues using equipment (OT)  Outcome: Completed/Met Date Met:  10/17/14  Problem: RH Bathing Goal: LTG Patient will bathe with assist, cues/equipment (OT) LTG: Patient will bathe specified number of body parts with assist with/without cues using equipment (position) (OT)  Outcome: Completed/Met Date Met:  10/17/14  Problem: RH Dressing Goal: LTG Patient will perform upper body dressing (OT) LTG Patient will perform upper body dressing with assist, with/without cues (OT).  Outcome: Completed/Met Date Met:  10/17/14 Goal: LTG Patient will perform lower body dressing w/assist (OT) LTG: Patient will perform lower body dressing with assist, with/without cues in positioning using equipment (OT)  Outcome: Completed/Met Date Met:  10/17/14  Problem: RH Toileting Goal: LTG Patient will perform toileting w/assist, cues/equip (OT) LTG: Patient will perform toiletiing (clothes management/hygiene) with assist, with/without cues using equipment (OT)  Outcome: Completed/Met Date Met:  10/17/14  Problem: RH Toilet Transfers Goal: LTG Patient will perform toilet transfers w/assist (OT) LTG: Patient will perform toilet transfers with assist, with/without cues using equipment (OT)  Outcome: Completed/Met Date Met:  10/17/14  Problem: RH Tub/Shower Transfers Goal: LTG Patient will perform tub/shower transfers w/assist (OT) LTG: Patient will perform tub/shower transfers with assist, with/without cues using equipment (OT)  Outcome: Completed/Met Date Met:  10/17/14  Problem: RH Memory Goal: LTG Patient will demonstrate ability  for day to day (OT) LTG: Patient will demonstrate ability for day to day recall/carryover during activities of daily living with assist (OT)  Outcome: Completed/Met Date Met:  10/17/14  Problem: RH Attention Goal: LTG Patient will demonstrate focused/sustained (OT) LTG: Patient will demonstrate focused/sustained/selective/alternating/divided attention during functional activities in specific environment with assist for # of minutes (OT)  Outcome: Completed/Met Date Met:  10/17/14  Problem: RH Awareness Goal: LTG: Patient will demonstrate intellectual/emergent (OT) LTG: Patient will demonstrate intellectual/emergent/anticipatory awareness with assist during a functional activity (OT)  Outcome: Completed/Met Date Met:  10/17/14

## 2014-10-17 NOTE — Progress Notes (Signed)
Occupational Therapy Discharge Summary  Patient Details  Name: Frank Simpson MRN: [id number] Date of Birth: 11/11/1931  Today's Date: 10/17/2014   Patient has met 36 of 11 long term goals due to improved activity tolerance, improved balance, postural control, ability to compensate for deficits, improved attention, improved awareness and improved coordination.  Pt made steady progress with BADLs during this admission.  Pt completes all BADLs with supervision for safety awareness.  Pt requires occasional verbal cues for safety awareness with funcitonal mobility using RW.  Pt's wife has been present during therapy sessions and verbalizes/demonstrtates appropriate level of supervision.  Pt and wife verbalized understanding of 24 hour recommendation. Patient to discharge at overall Supervision level.  Patient's care partner is independent to provide the necessary physical and cognitive assistance at discharge.      Recommendation:  No skilled occupational therapy recommended at this time.   Equipment: No equipment provided Pt owns tub bench and BSC  Reasons for discharge: treatment goals met and discharge from hospital  Patient/family agrees with progress made and goals achieved: Yes  OT Discharge  Pain Pain Assessment Pain Assessment: No/denies pain ADL ADL ADL Comments: see FIM Vision/Perception  Vision- History Baseline Vision/History: Wears glasses Wears Glasses: At all times Patient Visual Report: No change from baseline Vision- Assessment Vision Assessment?: No apparent visual deficits  Cognition Overall Cognitive Status: History of cognitive impairments - at baseline Arousal/Alertness: Awake/alert Orientation Level: Oriented to person;Oriented to situation;Oriented to time;Disoriented to place Attention: Sustained;Selective;Focused Focused Attention: Appears intact Sustained Attention: Appears intact Selective Attention: Impaired Selective Attention Impairment: Verbal  basic;Functional basic Memory: Impaired Memory Impairment: Decreased recall of new information;Decreased short term memory Decreased Short Term Memory: Verbal basic;Functional basic Awareness: Impaired Awareness Impairment: Emergent impairment Problem Solving: Impaired Problem Solving Impairment: Verbal basic;Functional basic Behaviors: Lability Safety/Judgment: Impaired [employer] Scales of Cognitive Functioning: Purposeful/appropriate Sensation Sensation Light Touch: Appears Intact Stereognosis: Appears Intact Hot/Cold: Appears Intact Proprioception: Appears Intact Coordination Gross Motor Movements are Fluid and Coordinated: Yes Fine Motor Movements are Fluid and Coordinated: Yes Motor  Motor Motor: Within Functional Limits Trunk/Postural Assessment  Cervical Assessment Cervical Assessment: Within Functional Limits Thoracic Assessment Thoracic Assessment: Within Functional Limits Lumbar Assessment Lumbar Assessment: Within Functional Limits Postural Control Righting Reactions: delayed Protective Responses: delayed  Balance Static Sitting Balance Static Sitting - Balance Support: Feet supported;No upper extremity supported Static Sitting - Level of Assistance: 6: Modified independent (Device/Increase time) Dynamic Sitting Balance Dynamic Sitting - Balance Support: Feet supported;Right upper extremity supported;Left upper extremity supported Dynamic Sitting - Level of Assistance: 6: Modified independent (Device/Increase time) Extremity/Trunk Assessment RUE Assessment RUE Assessment: Within Functional Limits LUE Assessment LUE Assessment: Exceptions to [facility] (limted AROM secondary to pacemaker precautions)  See FIM for current functional status  [name] 10/17/2014, 2:19 PM

## 2014-10-17 NOTE — Plan of Care (Signed)
Problem: RH Balance Goal: LTG Patient will maintain dynamic sitting balance (PT) LTG: Patient will maintain dynamic sitting balance with assistance during mobility activities (PT)  Outcome: Completed/Met Date Met:  10/17/14 Goal: LTG Patient will maintain dynamic standing balance (PT) LTG: Patient will maintain dynamic standing balance with assistance during mobility activities (PT)  Outcome: Completed/Met Date Met:  10/17/14  Problem: RH Bed Mobility Goal: LTG Patient will perform bed mobility with assist (PT) LTG: Patient will perform bed mobility with assistance, with/without cues (PT).  Outcome: Completed/Met Date Met:  10/17/14  Problem: RH Bed to Chair Transfers Goal: LTG Patient will perform bed/chair transfers w/assist (PT) LTG: Patient will perform bed/chair transfers with assistance, with/without cues (PT).  Outcome: Completed/Met Date Met:  10/17/14  Problem: RH Car Transfers Goal: LTG Patient will perform car transfers with assist (PT) LTG: Patient will perform car transfers with assistance (PT).  Outcome: Completed/Met Date Met:  10/17/14  Problem: RH Furniture Transfers Goal: LTG Patient will perform furniture transfers w/assist (OT/PT LTG: Patient will perform furniture transfers with assistance (OT/PT).  Outcome: Completed/Met Date Met:  10/17/14  Problem: RH Floor Transfers Goal: LTG Patient will perform floor transfers w/assist (PT) LTG: Patient will perform floor transfers with assistance (PT).  Outcome: Completed/Met Date Met:  10/17/14  Problem: RH Ambulation Goal: LTG Patient will ambulate in controlled environment (PT) LTG: Patient will ambulate in a controlled environment, # of feet with assistance (PT).  Outcome: Completed/Met Date Met:  10/17/14 Goal: LTG Patient will ambulate in home environment (PT) LTG: Patient will ambulate in home environment, # of feet with assistance (PT).  Outcome: Completed/Met Date Met:  10/17/14 Goal: LTG Patient will  ambulate in community environment (PT) LTG: Patient will ambulate in community environment, # of feet with assistance (PT).  Outcome: Completed/Met Date Met:  10/17/14  Problem: RH Stairs Goal: LTG Patient will ambulate up and down stairs w/assist (PT) LTG: Patient will ambulate up and down # of stairs with assistance (PT)  Outcome: Completed/Met Date Met:  10/17/14  Problem: RH Memory Goal: LTG Patient demonstrate ability for day to day recall (PT) LTG: Patient will demonstrate ability for day to day recall/carryover during mobility activities with assist (PT)  Outcome: Completed/Met Date Met:  10/17/14  Problem: RH Attention Goal: LTG Patient will demonstrate focused/sustained (PT) LTG: Patient will demonstrate focused/sustained/selective/alternating/divided attention during functional mobility in specific environment with assist for # of minutes (PT)  Outcome: Completed/Met Date Met:  10/17/14  Problem: RH Awareness Goal: LTG: Patient will demonstrate intellectual/emergent (PT) LTG: Patient will demonstrate intellectual/emergent/anticipatory awareness with assist during a mobility activity (PT)  Outcome: Completed/Met Date Met:  10/17/14

## 2014-10-18 ENCOUNTER — Telehealth: Payer: Self-pay | Admitting: Family Medicine

## 2014-10-18 NOTE — Telephone Encounter (Signed)
Received d/c summary for patient. plz call on Monday for fu phone call and verify f/u appt on 12/1 at 12 noon. Hospitalized 10/07/2014 - 10/17/2014 for fall with traumatic brain injury and symptomatic bradycardia with complete heart block s/p pacemaker placement.

## 2014-10-18 NOTE — Progress Notes (Signed)
Social Work  Discharge Note  The overall goal for the admission was met for:   Discharge location: Yes - home with wife who can provide 24/7 assistance  Length of Stay: Yes - 8 days  Discharge activity level: Yes - supervision  Home/community participation: Yes  Services provided included: MD, RD, PT, OT, SLP, RN, TR, Pharmacy, [place]: Medicare and Private Insurance: [facility]  Follow-up services arranged: Home Health: PT, OT via [place], DME: rolling walker via [place] and Patient/Family has no preference for HH/DME agencies  Comments (or additional information):  Patient/Family verbalized understanding of follow-up arrangements: Yes  Individual responsible for coordination of the follow-up plan: wife  Confirmed correct DME delivered: Lennart Pall 10/18/2014    Maruice Pieroni

## 2014-10-18 NOTE — Progress Notes (Signed)
Social Work Patient ID: Frank Simpson, male   DOB: 1931/06/17, 80 y.o.   MRN: [id number]   Lennart Pall, LCSW Social Worker Addendum  Patient Care Conference 10/15/2014  3:09 PM    Expand All Collapse All   Inpatient RehabilitationTeam Conference and Plan of Care Update Date: 10/14/2014   Time: 3:00 PM     Patient Name: Frank Simpson       Medical Record Number: [id number]  Date of Birth: 11/01/1931 Sex: Male         Room/Bed: 4W18C/4W18C-01 Payor Info: Payor: MEDICARE / Plan: MEDICARE PART A AND B / Product Type: *No Product type* /    Admitting Diagnosis: TBI  S P FALL AND PACER PLACEMENT  Admit Date/Time:  10/07/2014  3:32 PM Admission Comments: No comment available   Primary Diagnosis:  <principal problem not specified> Principal Problem: <principal problem not specified>    Patient Active Problem List     Diagnosis  Date Noted   .  Gait disorder  10/07/2014   .  Fall     .  Traumatic brain injury with loss of consciousness of 31 minutes to 59 minutes  10/03/2014   .  Syncope  10/03/2014   .  Junctional bradycardia  10/03/2014   .  Benign positional vertigo  09/25/2014   .  Medicare annual wellness visit, initial  07/09/2014   .  Mood disorder  07/09/2014   .  B12 deficiency  06/11/2014   .  Fall  04/23/2014   .  Diabetes mellitus type 2 with retinopathy  02/19/2014   .  Rash of face  01/03/2014   .  Shoulder injury  07/10/2013   .  Right knee pain  07/10/2013   .  Memory loss  04/16/2013   .  Syncope  04/02/2013   .  Post herpetic neuralgia  02/26/2013   .  Unstable angina  10/15/2012   .  CAD (coronary artery disease)  10/15/2012   .  Acute on chronic systolic CHF (congestive heart failure)  10/15/2012   .  Benign hypertension with CKD (chronic kidney disease) stage III  10/15/2012   .  Thrombocytopenia     .  STEMI (ST elevation myocardial infarction)  08/06/2012   .  Osteoarthritis     .  CKD stage 3 due to type 2 diabetes mellitus     .  Urinary incontinence      .  Type 2 diabetes, uncontrolled, with neuropathy  07/18/2011   .  Hypercholesterolemia  07/18/2011     Expected Discharge Date: Expected Discharge Date: 10/17/14  Team Members Present: Physician leading conference: Dr. Alger Simons Social Worker Present: Lennart Pall, LCSW Nurse Present: Dwaine Gale, RN PT Present: Bridgett Ripa, Cottie Banda, PT OT Present: Roanna Epley, COTA;Jennifer Jolene Schimke, OT SLP Present: Weston Anna, SLP PPS Coordinator present : Daiva Nakayama, RN, CRRN        Current Status/Progress  Goal  Weekly Team Focus   Medical     TBI, ongoing cognitive deficits, baseline dementia   improve sleep, interactions with team   improve attention, sleep =wake   Bowel/Bladder     continent of bowel and bladder with toileting, occassional incontinence  decrease episodes of incontinence      Swallow/Nutrition/ Hydration     CMM         ADL's     bathing/dressing - steady assist/close supervision; functional amb with RW-close supervision; decreased safety awareness  supervision overall  activity tolerance; family education; standing balance    Mobility     close (S)-min guard A overall with use of RW  Supervision overall with use of RW   functional endurance, safety during functional transfers and mobility,pt/family education, d/c planning   Communication     able to make his needs known         Safety/Cognition/ Behavioral Observations    no unsafe behaviors   no falls with injury  attention, awareness, problem solving, self-monitor and correcting errors    Pain     no complaint of pain  3 or less on scale of 1-10      Skin     healing abrasion to top of head, neosporin to area daily   no new skin breakdown       Rehab Goals Patient on target to meet rehab goals: Yes *See Care Plan and progress notes for long and short-term goals.    Barriers to Discharge:  baseline cognitive deficits     Possible Resolutions to Barriers:   family ed,  supervision     Discharge Planning/Teaching Needs:   home with wife who can provide 24/7 supervision       Team Discussion:    Likely at baseline cognitively per input of wife.  Bowel issues.  Supervision goals.     Revisions to Treatment Plan:    None    Continued Need for Acute Rehabilitation Level of Care: The patient requires daily medical management by a physician with specialized training in physical medicine and rehabilitation for the following conditions: Daily direction of a multidisciplinary physical rehabilitation program to ensure safe treatment while eliciting the highest outcome that is of practical value to the patient.: Yes Daily medical management of patient stability for increased activity during participation in an intensive rehabilitation regime.: Yes Daily analysis of laboratory values and/or radiology reports with any subsequent need for medication adjustment of medical intervention for : Neurological problems  [name] 10/18/2014, 2:13 PM        Revision History      Date/Time User Provider Type Action    10/18/2014  2:13 PM Lennart Pall, LCSW Social Worker Addend    10/18/2014  2:11 PM Lennart Pall, LCSW Social Worker Incomplete Revision    10/15/2014  3:15 PM Lennart Pall, LCSW Social Worker Sign    View Details Report

## 2014-10-19 DIAGNOSIS — Z48812 Encounter for surgical aftercare following surgery on the circulatory system: Secondary | ICD-10-CM | POA: Diagnosis not present

## 2014-10-19 DIAGNOSIS — I10 Essential (primary) hypertension: Secondary | ICD-10-CM | POA: Diagnosis not present

## 2014-10-19 DIAGNOSIS — S069X9D Unspecified intracranial injury with loss of consciousness of unspecified duration, subsequent encounter: Secondary | ICD-10-CM | POA: Diagnosis not present

## 2014-10-19 DIAGNOSIS — E1140 Type 2 diabetes mellitus with diabetic neuropathy, unspecified: Secondary | ICD-10-CM | POA: Diagnosis not present

## 2014-10-19 DIAGNOSIS — R2689 Other abnormalities of gait and mobility: Secondary | ICD-10-CM | POA: Diagnosis not present

## 2014-10-19 DIAGNOSIS — F329 Major depressive disorder, single episode, unspecified: Secondary | ICD-10-CM | POA: Diagnosis not present

## 2014-10-20 ENCOUNTER — Telehealth: Payer: Self-pay | Admitting: *Deleted

## 2014-10-20 NOTE — Telephone Encounter (Signed)
Eudelia Bunch, PT with [facility] called requesting verbal order for in home PT 2x/wk for 3 wks.

## 2014-10-20 NOTE — Telephone Encounter (Signed)
Ok to do, thanks.

## 2014-10-20 NOTE — Telephone Encounter (Signed)
Phillip aware.

## 2014-10-20 NOTE — Telephone Encounter (Signed)
Spoke with patient's wife and she said patient is doing as well as can be expected. She said he is getting therapy at home and will be in for f/u on 10/28/14.

## 2014-10-21 DIAGNOSIS — E1140 Type 2 diabetes mellitus with diabetic neuropathy, unspecified: Secondary | ICD-10-CM | POA: Diagnosis not present

## 2014-10-21 DIAGNOSIS — Z48812 Encounter for surgical aftercare following surgery on the circulatory system: Secondary | ICD-10-CM | POA: Diagnosis not present

## 2014-10-21 DIAGNOSIS — S069X9D Unspecified intracranial injury with loss of consciousness of unspecified duration, subsequent encounter: Secondary | ICD-10-CM | POA: Diagnosis not present

## 2014-10-21 DIAGNOSIS — R2689 Other abnormalities of gait and mobility: Secondary | ICD-10-CM | POA: Diagnosis not present

## 2014-10-21 DIAGNOSIS — F329 Major depressive disorder, single episode, unspecified: Secondary | ICD-10-CM | POA: Diagnosis not present

## 2014-10-21 DIAGNOSIS — I10 Essential (primary) hypertension: Secondary | ICD-10-CM | POA: Diagnosis not present

## 2014-10-22 ENCOUNTER — Ambulatory Visit (INDEPENDENT_AMBULATORY_CARE_PROVIDER_SITE_OTHER): Payer: Medicare Other

## 2014-10-22 DIAGNOSIS — E538 Deficiency of other specified B group vitamins: Secondary | ICD-10-CM | POA: Diagnosis not present

## 2014-10-22 MED ORDER — CYANOCOBALAMIN 1000 MCG/ML IJ SOLN
1000.0000 ug | Freq: Once | INTRAMUSCULAR | Status: AC
  Administered 2014-10-22: 1000 ug via INTRAMUSCULAR

## 2014-10-27 ENCOUNTER — Encounter (INDEPENDENT_AMBULATORY_CARE_PROVIDER_SITE_OTHER): Payer: Medicare Other | Admitting: Ophthalmology

## 2014-10-27 ENCOUNTER — Telehealth: Payer: Self-pay | Admitting: Internal Medicine

## 2014-10-27 NOTE — Telephone Encounter (Signed)
Wound care instructions given to the patient.  Appointment made for 11/05/14.

## 2014-10-27 NOTE — Telephone Encounter (Signed)
New Message  Pt wife called states that the pt had a permanent pacer put in but has yet to come in for a wound check appt// pt wife is not sure if they can get the electros wet. She has covered them up so far for bathing. Please call back to discuss and schedule wound check appt appt per melissa//sr

## 2014-10-28 ENCOUNTER — Ambulatory Visit (INDEPENDENT_AMBULATORY_CARE_PROVIDER_SITE_OTHER): Payer: Medicare Other | Admitting: Family Medicine

## 2014-10-28 ENCOUNTER — Encounter: Payer: Self-pay | Admitting: Family Medicine

## 2014-10-28 VITALS — BP 142/78 | HR 72 | Temp 98.2°F | Wt 211.5 lb

## 2014-10-28 DIAGNOSIS — W19XXXD Unspecified fall, subsequent encounter: Secondary | ICD-10-CM

## 2014-10-28 DIAGNOSIS — N183 Chronic kidney disease, stage 3 unspecified: Secondary | ICD-10-CM

## 2014-10-28 DIAGNOSIS — I10 Essential (primary) hypertension: Secondary | ICD-10-CM | POA: Diagnosis not present

## 2014-10-28 DIAGNOSIS — E11319 Type 2 diabetes mellitus with unspecified diabetic retinopathy without macular edema: Secondary | ICD-10-CM

## 2014-10-28 DIAGNOSIS — R413 Other amnesia: Secondary | ICD-10-CM

## 2014-10-28 DIAGNOSIS — F323 Major depressive disorder, single episode, severe with psychotic features: Secondary | ICD-10-CM

## 2014-10-28 DIAGNOSIS — S069X2S Unspecified intracranial injury with loss of consciousness of 31 minutes to 59 minutes, sequela: Secondary | ICD-10-CM

## 2014-10-28 DIAGNOSIS — R2689 Other abnormalities of gait and mobility: Secondary | ICD-10-CM | POA: Diagnosis not present

## 2014-10-28 DIAGNOSIS — E538 Deficiency of other specified B group vitamins: Secondary | ICD-10-CM

## 2014-10-28 DIAGNOSIS — E1140 Type 2 diabetes mellitus with diabetic neuropathy, unspecified: Secondary | ICD-10-CM | POA: Diagnosis not present

## 2014-10-28 DIAGNOSIS — F329 Major depressive disorder, single episode, unspecified: Secondary | ICD-10-CM | POA: Diagnosis not present

## 2014-10-28 DIAGNOSIS — I129 Hypertensive chronic kidney disease with stage 1 through stage 4 chronic kidney disease, or unspecified chronic kidney disease: Secondary | ICD-10-CM

## 2014-10-28 DIAGNOSIS — Z48812 Encounter for surgical aftercare following surgery on the circulatory system: Secondary | ICD-10-CM | POA: Diagnosis not present

## 2014-10-28 DIAGNOSIS — R32 Unspecified urinary incontinence: Secondary | ICD-10-CM

## 2014-10-28 DIAGNOSIS — S069X9D Unspecified intracranial injury with loss of consciousness of unspecified duration, subsequent encounter: Secondary | ICD-10-CM | POA: Diagnosis not present

## 2014-10-28 MED ORDER — FINASTERIDE 5 MG PO TABS: 5.0000 mg | ORAL_TABLET | Freq: Every day | ORAL | Status: DC

## 2014-10-28 MED ORDER — TAMSULOSIN HCL 0.4 MG PO CAPS: 0.4000 mg | ORAL_CAPSULE | Freq: Every day | ORAL | Status: DC

## 2014-10-28 NOTE — Assessment & Plan Note (Signed)
Longstanding depression started on zoloft prior to recent hospitalization, with paranoid ideations during hospitalization  Started on seroquel in hospital - continue for now, but would consider taper off if able over next few months - will address this next visit. Continue zoloft 51m daily.

## 2014-10-28 NOTE — Assessment & Plan Note (Signed)
Resolving. Has f/u appt with rehab MD next month.

## 2014-10-28 NOTE — Assessment & Plan Note (Signed)
Presumed BPH - longterm flomax use. Will trial finasteride x 2 mo then trial off flomax. Goal to stop flomax 2/2 fall risk.

## 2014-10-28 NOTE — Progress Notes (Signed)
Pre visit review using our clinic review tool, if applicable. No additional management support is needed unless otherwise documented below in the visit note.

## 2014-10-28 NOTE — Assessment & Plan Note (Signed)
Lab Results  Component Value Date   HGBA1C 7.8* 06/11/2014  Continue lantus 40 u nightly and humalog sliding scale. Reviewed need to take humalog with meals.

## 2014-10-28 NOTE — Assessment & Plan Note (Signed)
Continue B12 IM monthly. Started 01/2014

## 2014-10-28 NOTE — Assessment & Plan Note (Signed)
Suspicion for mild underlying dementia. Continue to monitor. Will need MMSE at next visit. Continue B12 shots.

## 2014-10-28 NOTE — Progress Notes (Signed)
BP 142/78 mmHg  Pulse 72  Temp(Src) 98.2 F (36.8 C) (Oral)  Wt 211 lb 8 oz (95.936 kg)  SpO2 95%   CC: hospital f/u  Subjective:    Patient ID: Frank Simpson, male    DOB: [birth year], 80 y.o.   MRN: [id number]  HPI: Frank Simpson is a 80 y.o. male presenting on 10/28/2014 for Follow-up   Hospitalized 11/6 - then to [place] 10/07/2014 - 10/17/2014 for fall with traumatic brain injury and symptomatic bradycardia with complete heart block s/p pacemaker placement. Seroquel started for paranoid thinking in hospital.  Has f/u with cardiology set up for 11/05/2014.  Brillinta was stopped 2/2 recurrent falls, planning on discussing this with cardiology. Persistent lightheadedness.  Continues B12 shots. Lab Results  Component Value Date   VITAMINB12 296 06/11/2014  BPH - on flomax. No results found for: PSA  DATE OF ADMISSION: 10/07/2014 DATE OF DISCHARGE: 10/17/2014  F/u phone call: 10/18/2014  DISCHARGE DIAGNOSES: 1. Functional deficits secondary to traumatic brain injury, scalp  hematoma after fall. 2. Pain management. 3. Symptomatic bradycardia with complete heart block, status post  pacemaker on October 06, 2014. 4. Hypertension. 5. Diabetes mellitus, peripheral neuropathy. 6. Benign prostatic hypertrophy. 7. History of depression. 8. Hyperlipidemia.  DISCHARGE MEDICATIONS: 1. Norvasc 5 mg p.o. daily. 2. Aspirin 81 mg p.o. daily. 3. Hydrocodone 1 to 2 tablets every 4 hours as needed, moderate pain, dispense of 90 tablets ==> not needing 4. Lantus insulin 20 units subcutaneous b.i.d.==> pt taking 40u nightly 5. Imdur 30 mg p.o. daily. 6. Nitroglycerin as needed. 7. Seroquel 25 mg at breakfast and 50 mg at bedtime. 8. Crestor 40 mg p.o. daily. 9. Zoloft 25 mg p.o. daily. 10.Flomax 0.4 mg p.o. daily.  Relevant past medical, surgical, family and social history reviewed and updated as indicated. Interim medical history since our last visit reviewed. Allergies  and medications reviewed and updated.  Current Outpatient Prescriptions on File Prior to Visit  Medication Sig  . amLODipine (NORVASC) 5 MG tablet Take 1 tablet (5 mg total) by mouth daily.  [name] Kitchen aspirin 81 MG chewable tablet Chew 1 tablet (81 mg total) by mouth daily.  . cyanocobalamin (,VITAMIN B-12,) 1000 MCG/ML injection Inject 1 mL (1,000 mcg total) into the muscle every 30 (thirty) days.  [name] Kitchen glucose blood (ONE TOUCH ULTRA TEST) test strip Check four times daily,Dx E11.319  . insulin glargine (LANTUS) 100 UNIT/ML injection 20 units twice a day (Patient taking differently: Inject 40 Units into the skin at bedtime. )  . Insulin Pen Needle 31G X 8 MM MISC Use as directed with Lantus solostar. Dx 250.62.  [name] Kitchen Insulin Syringe-Needle U-100 (BD INSULIN SYRINGE ULTRAFINE) 31G X 5/16" 0.5 ML MISC Inject 4x a day  . isosorbide mononitrate (IMDUR) 30 MG 24 hr tablet Take 1 tablet (30 mg total) by mouth daily.  . nitroGLYCERIN (NITROSTAT) 0.4 MG SL tablet Place 1 tablet (0.4 mg total) under the tongue every 5 (five) minutes as needed for chest pain. Up to three doses,if pain continues call 911  . QUEtiapine (SEROQUEL) 25 MG tablet 25 mg at breakfast and 50 mg daily at bedtime  . rosuvastatin (CRESTOR) 40 MG tablet Take 1 tablet (40 mg total) by mouth daily.  . sertraline (ZOLOFT) 25 MG tablet Take 1 tablet (25 mg total) by mouth daily.   No current facility-administered medications on file prior to visit.   Past Medical History  Diagnosis Date  . Hypertension   . Diabetes mellitus type  2 with retinopathy 1994    dm edu 2012  . Hyperlipidemia   . Osteoarthritis   . CRI (chronic renal insufficiency)     baseline Cr seems to be 1.7-1.8  . Urinary incontinence     s/p PTNS didn't help  . Glaucoma     and cataracts  . History of melanoma   . CAD (coronary artery disease)     07/2012 acute STEMI, mid LAD PCI - DES; cath 09/2012 patent LAD stent, non-hemodynamically significant Left Main/LAD disease, EF  55%  . Thrombocytopenia   . BCC (basal cell carcinoma of skin) 2015    L neck (Dr. Sherrye Payor), L forearm [facility])  . Complication of anesthesia     confused after cath 10/15/2012  . CVA (cerebral infarction) 09/2012    remote anterior limb of left internal capsule  . Post herpetic neuralgia   . Shingles in March 2014    right chest, across the back  . Syncope 04/02/2013  . Blurred vision   . Dysplastic nevus 2015    L upper back, Lat margin involved ([place])  . CHF (congestive heart failure)   . Ischemic heart disease   . Benign positional vertigo     Past Surgical History  Procedure Laterality Date  . Replacement total knee  04/2010    RIGHT KNEE  . Tonsillectomy    . Cardiovascular stress test  04/27/2010    EF 75%, nuclear stress test with normal perfusion, no ischemia  . Foot surgery      metal pin in place  . Right shoulder    . Finger surgery      amputated finger  . Lexiscan myoview  10/2011    negative for ischemia  . Cataract extraction  12/12, 1/13    bilateral  . Coronary stent placement  07/2012    DES to mid LAD for STEMI  . Cardiac catheterization  10/15/2012  . Carotid US  03/2013    WNL  . US echocardiography  03/2013    inf/septal hypokinesis, mild LVH, EF 45%, LA mildly dilated  . Penile prosthesis implant      Review of Systems Per HPI unless specifically indicated above     Objective:    BP 142/78 mmHg  Pulse 72  Temp(Src) 98.2 F (36.8 C) (Oral)  Wt 211 lb 8 oz (95.936 kg)  SpO2 95%  Wt Readings from Last 3 Encounters:  10/28/14 211 lb 8 oz (95.936 kg)  10/15/14 205 lb 3.2 oz (93.078 kg)  10/03/14 207 lb 0.2 oz (93.9 kg)    Physical Exam  Constitutional: He appears well-developed and well-nourished. No distress.  HENT:  Mouth/Throat: Oropharynx is clear and moist. No oropharyngeal exudate.  Eyes: Conjunctivae and EOM are normal. Pupils are equal, round, and reactive to light. No scleral icterus.  Cardiovascular: Normal rate, regular  rhythm, normal heart sounds and intact distal pulses.   No murmur heard. Pulmonary/Chest: Effort normal and breath sounds normal. No respiratory distress. He has no wheezes. He has no rales.  Musculoskeletal: He exhibits no edema.  Skin: Skin is warm and dry. No rash noted.  Psychiatric: He has a normal mood and affect.  Responds questions appropriately  Nursing note and vitals reviewed.      Assessment & Plan:   Problem List Items Addressed This Visit    Urinary incontinence    Presumed BPH - longterm flomax use. Will trial finasteride x 2 mo then trial off flomax. Goal to stop flomax 2/2  fall risk.    Relevant Medications      tamsulosin (FLOMAX) 0.4 MG CAPS capsule      PROSCAR 5 MG PO TABS   Traumatic brain injury with loss of consciousness of 31 minutes to 59 minutes    Resolving. Has f/u appt with rehab MD next month.    Memory loss    Suspicion for mild underlying dementia. Continue to monitor. Will need MMSE at next visit. Continue B12 shots.    Major psychotic depression, single episode    Longstanding depression started on zoloft prior to recent hospitalization, with paranoid ideations during hospitalization  Started on seroquel in hospital - continue for now, but would consider taper off if able over next few months - will address this next visit. Continue zoloft 23m daily.    Fall - Primary    Recent fall with TBI and forehead trauma - ?hypoglycemia related vs heart block related. Persistent dizziness. Now regularly using walker and s/p pacer placement.    Diabetes mellitus type 2 with retinopathy    Lab Results  Component Value Date   HGBA1C 7.8* 06/11/2014  Continue lantus 40 u nightly and humalog sliding scale. Reviewed need to take humalog with meals.    Relevant Medications      insulin lispro (HUMALOG) 100 UNIT/ML injection   Benign hypertension with CKD (chronic kidney disease) stage III    Chronic, stable. Mildly elevated today, but no changes  recommended.    B12 deficiency    Continue B12 IM monthly. Started 01/2014        Follow up plan: Return as needed, for follow up visit.

## 2014-10-28 NOTE — Assessment & Plan Note (Signed)
Chronic, stable. Mildly elevated today, but no changes recommended.

## 2014-10-28 NOTE — Patient Instructions (Addendum)
Let's continue flomax and start finasteride 23m daily.  After 2 months may try trial off flomax if finasteride works well. Keep appointment with cardiology to see about brillinta. Good to see you today, call [place] questions.

## 2014-10-28 NOTE — Assessment & Plan Note (Signed)
Recent fall with TBI and forehead trauma - ?hypoglycemia related vs heart block related. Persistent dizziness. Now regularly using walker and s/p pacer placement.

## 2014-10-30 DIAGNOSIS — E1140 Type 2 diabetes mellitus with diabetic neuropathy, unspecified: Secondary | ICD-10-CM | POA: Diagnosis not present

## 2014-10-30 DIAGNOSIS — I10 Essential (primary) hypertension: Secondary | ICD-10-CM | POA: Diagnosis not present

## 2014-10-30 DIAGNOSIS — R2689 Other abnormalities of gait and mobility: Secondary | ICD-10-CM | POA: Diagnosis not present

## 2014-10-30 DIAGNOSIS — F329 Major depressive disorder, single episode, unspecified: Secondary | ICD-10-CM | POA: Diagnosis not present

## 2014-10-30 DIAGNOSIS — Z48812 Encounter for surgical aftercare following surgery on the circulatory system: Secondary | ICD-10-CM | POA: Diagnosis not present

## 2014-10-30 DIAGNOSIS — S069X9D Unspecified intracranial injury with loss of consciousness of unspecified duration, subsequent encounter: Secondary | ICD-10-CM | POA: Diagnosis not present

## 2014-11-05 ENCOUNTER — Ambulatory Visit (INDEPENDENT_AMBULATORY_CARE_PROVIDER_SITE_OTHER): Payer: Medicare Other | Admitting: *Deleted

## 2014-11-05 DIAGNOSIS — R001 Bradycardia, unspecified: Secondary | ICD-10-CM | POA: Diagnosis not present

## 2014-11-05 LAB — MDC_IDC_ENUM_SESS_TYPE_INCLINIC
Battery Voltage: 3.05 V
Brady Statistic RA Percent Paced: 16 %
Date Time Interrogation Session: 20151209210237
Implantable Pulse Generator Model: 2240
Lead Channel Impedance Value: 400 Ohm
Lead Channel Pacing Threshold Amplitude: 0.75 V
Lead Channel Pacing Threshold Amplitude: 1 V
Lead Channel Pacing Threshold Pulse Width: 0.4 ms
Lead Channel Pacing Threshold Pulse Width: 0.4 ms
Lead Channel Sensing Intrinsic Amplitude: 4.2 mV
Lead Channel Setting Pacing Amplitude: 3.5 V
Lead Channel Setting Pacing Amplitude: 3.5 V
Lead Channel Setting Sensing Sensitivity: 2 mV
MDC IDC MSMT BATTERY REMAINING LONGEVITY: 127.2 mo
MDC IDC MSMT LEADCHNL RA PACING THRESHOLD AMPLITUDE: 0.75 V
MDC IDC MSMT LEADCHNL RA PACING THRESHOLD PULSEWIDTH: 0.4 ms
MDC IDC MSMT LEADCHNL RV IMPEDANCE VALUE: 462.5 Ohm
MDC IDC MSMT LEADCHNL RV PACING THRESHOLD AMPLITUDE: 1 V
MDC IDC MSMT LEADCHNL RV PACING THRESHOLD PULSEWIDTH: 0.4 ms
MDC IDC MSMT LEADCHNL RV SENSING INTR AMPL: 12 mV
MDC IDC PG SERIAL: 7666171
MDC IDC SET LEADCHNL RV PACING PULSEWIDTH: 0.4 ms
MDC IDC STAT BRADY RV PERCENT PACED: 1 %

## 2014-11-05 NOTE — Progress Notes (Signed)

## 2014-11-06 ENCOUNTER — Encounter (HOSPITAL_COMMUNITY): Payer: Self-pay | Admitting: Cardiovascular Disease

## 2014-11-11 ENCOUNTER — Telehealth: Payer: Self-pay | Admitting: Internal Medicine

## 2014-11-11 NOTE — Telephone Encounter (Signed)
LMOVM w/ my direct #.

## 2014-11-11 NOTE — Telephone Encounter (Signed)
New message     Pt had a pacemaker put in nov 9th.  He said the site is big like a lemon--it is not red, not painful to the touch.  Pt did lift a battery out of the lawn mower on the 12th. Please advise

## 2014-11-11 NOTE — Telephone Encounter (Signed)
Pt's device pocket started swelling approx yesterday. Spouse states swelling is "like a tangerine." Swelling occurred after pt lifted a battery out of a lawn mower. Pt asymptomatic. No fever or chills. No pocket discoloration.  Coming to device clinic tomorrow at 2:30 while an EP is in office.

## 2014-11-12 ENCOUNTER — Ambulatory Visit (INDEPENDENT_AMBULATORY_CARE_PROVIDER_SITE_OTHER): Payer: Medicare Other | Admitting: *Deleted

## 2014-11-12 DIAGNOSIS — Z950 Presence of cardiac pacemaker: Secondary | ICD-10-CM

## 2014-11-12 NOTE — Progress Notes (Signed)
Pt concerned his device pocket has been swelling. His "swelling" is his implanted device. No redness or edema. No bruising present. Pt does not have fever or chills. Device was not interrogated.  ROV as planned w/ Dr. Lovena Le 01/09/15.

## 2014-11-14 ENCOUNTER — Encounter: Payer: Self-pay | Admitting: Internal Medicine

## 2014-11-15 DIAGNOSIS — E1140 Type 2 diabetes mellitus with diabetic neuropathy, unspecified: Secondary | ICD-10-CM | POA: Diagnosis not present

## 2014-11-15 DIAGNOSIS — S069X2S Unspecified intracranial injury with loss of consciousness of 31 minutes to 59 minutes, sequela: Secondary | ICD-10-CM

## 2014-11-15 DIAGNOSIS — E11319 Type 2 diabetes mellitus with unspecified diabetic retinopathy without macular edema: Secondary | ICD-10-CM | POA: Diagnosis not present

## 2014-11-15 DIAGNOSIS — R413 Other amnesia: Secondary | ICD-10-CM

## 2014-11-15 DIAGNOSIS — F329 Major depressive disorder, single episode, unspecified: Secondary | ICD-10-CM

## 2014-11-15 DIAGNOSIS — E538 Deficiency of other specified B group vitamins: Secondary | ICD-10-CM

## 2014-11-15 DIAGNOSIS — W19XXXD Unspecified fall, subsequent encounter: Secondary | ICD-10-CM

## 2014-11-15 DIAGNOSIS — R32 Unspecified urinary incontinence: Secondary | ICD-10-CM

## 2014-11-15 DIAGNOSIS — F323 Major depressive disorder, single episode, severe with psychotic features: Secondary | ICD-10-CM | POA: Diagnosis not present

## 2014-11-15 DIAGNOSIS — E700 Classical phenylketonuria: Secondary | ICD-10-CM | POA: Diagnosis not present

## 2014-11-15 DIAGNOSIS — N183 Chronic kidney disease, stage 3 (moderate): Secondary | ICD-10-CM

## 2014-11-15 DIAGNOSIS — Z48812 Encounter for surgical aftercare following surgery on the circulatory system: Secondary | ICD-10-CM | POA: Diagnosis not present

## 2014-11-15 DIAGNOSIS — I129 Hypertensive chronic kidney disease with stage 1 through stage 4 chronic kidney disease, or unspecified chronic kidney disease: Secondary | ICD-10-CM | POA: Diagnosis not present

## 2014-11-15 DIAGNOSIS — S069X9D Unspecified intracranial injury with loss of consciousness of unspecified duration, subsequent encounter: Secondary | ICD-10-CM | POA: Diagnosis not present

## 2014-11-15 DIAGNOSIS — R29890 Loss of height: Secondary | ICD-10-CM

## 2014-11-25 ENCOUNTER — Ambulatory Visit (INDEPENDENT_AMBULATORY_CARE_PROVIDER_SITE_OTHER): Payer: Medicare Other

## 2014-11-25 DIAGNOSIS — E538 Deficiency of other specified B group vitamins: Secondary | ICD-10-CM

## 2014-11-25 MED ORDER — CYANOCOBALAMIN 1000 MCG/ML IJ SOLN
1000.0000 ug | Freq: Once | INTRAMUSCULAR | Status: AC
  Administered 2014-11-25: 1000 ug via INTRAMUSCULAR

## 2014-11-26 ENCOUNTER — Ambulatory Visit (INDEPENDENT_AMBULATORY_CARE_PROVIDER_SITE_OTHER): Payer: Medicare Other | Admitting: *Deleted

## 2014-11-26 ENCOUNTER — Telehealth: Payer: Self-pay | Admitting: Cardiology

## 2014-11-26 DIAGNOSIS — Z950 Presence of cardiac pacemaker: Secondary | ICD-10-CM

## 2014-11-26 NOTE — Telephone Encounter (Signed)
Pt wife called and stated that pt is having some pain around his pacemaker site and that he can not raise his arm. Pt wife agreed to an appt today at 1:30 PM.

## 2014-11-26 NOTE — Progress Notes (Signed)
Pt c/o of pain at device pocket for past three days. Upon further investigation, pain was at left shoulder. Pt does not have pocket pain. Pt does not have pain anywhere on/in his chest/torso.  No SOB, palps or pre/syncope since device implant. Pt's pain occurs especially when bracing himself to stand from recumbent positions. Pt's remote is updated and has not sent any alerts. Pt leaves remote monitor plugged in at all times.   Pt recalls previous falls. Pt unsure if previous fall impacted his left shoulder. Pt will contact PCP for further advice.  ROV w/ Dr. Lovena Le 01/09/15.

## 2014-11-28 DIAGNOSIS — C4492 Squamous cell carcinoma of skin, unspecified: Secondary | ICD-10-CM

## 2014-11-28 HISTORY — DX: Squamous cell carcinoma of skin, unspecified: C44.92

## 2014-12-01 DIAGNOSIS — M19012 Primary osteoarthritis, left shoulder: Secondary | ICD-10-CM | POA: Diagnosis not present

## 2014-12-01 DIAGNOSIS — M25512 Pain in left shoulder: Secondary | ICD-10-CM | POA: Diagnosis not present

## 2014-12-01 DIAGNOSIS — S4992XA Unspecified injury of left shoulder and upper arm, initial encounter: Secondary | ICD-10-CM | POA: Diagnosis not present

## 2014-12-01 DIAGNOSIS — M25612 Stiffness of left shoulder, not elsewhere classified: Secondary | ICD-10-CM | POA: Diagnosis not present

## 2014-12-02 ENCOUNTER — Inpatient Hospital Stay: Payer: Self-pay | Admitting: Physical Medicine & Rehabilitation

## 2014-12-03 DIAGNOSIS — L820 Inflamed seborrheic keratosis: Secondary | ICD-10-CM | POA: Diagnosis not present

## 2014-12-03 DIAGNOSIS — L814 Other melanin hyperpigmentation: Secondary | ICD-10-CM | POA: Diagnosis not present

## 2014-12-03 DIAGNOSIS — Z85828 Personal history of other malignant neoplasm of skin: Secondary | ICD-10-CM | POA: Diagnosis not present

## 2014-12-03 DIAGNOSIS — L080 Pyoderma: Secondary | ICD-10-CM | POA: Diagnosis not present

## 2014-12-03 DIAGNOSIS — L570 Actinic keratosis: Secondary | ICD-10-CM | POA: Diagnosis not present

## 2014-12-03 DIAGNOSIS — L821 Other seborrheic keratosis: Secondary | ICD-10-CM | POA: Diagnosis not present

## 2014-12-05 ENCOUNTER — Other Ambulatory Visit: Payer: Self-pay | Admitting: Family Medicine

## 2014-12-09 DIAGNOSIS — M19012 Primary osteoarthritis, left shoulder: Secondary | ICD-10-CM | POA: Diagnosis not present

## 2014-12-17 ENCOUNTER — Telehealth: Payer: Self-pay | Admitting: Cardiology

## 2014-12-17 NOTE — Telephone Encounter (Signed)
Spoke with wife and patient has been off Brilinta since d/c from the hospital in November  Wife wonders if he should be back on it Patient fallen twice in the house since d/c since he has a hard time getting around, otherwise doing ok Will forward to  Dr. Mare Ferrari for review (wife aware  Dr. Mare Ferrari out of the office)

## 2014-12-17 NOTE — Telephone Encounter (Signed)
His stent was in 2013. He does not still need DAPT (dual anti-platelet therapy). Just baby  Asa 81 mg daily now.

## 2014-12-17 NOTE — Telephone Encounter (Signed)
Pt c/o medication issue: 1. Name of Medication: Blood thinner//Brilanta 2. How are you currently taking this medication (dosage and times per day) has stopped taking it per discharge instructons 3. Are you having a reaction (difficulty breathing--STAT)? no 4. What is your medication issue? Pt wife called states that when he was discharged from the hospital he was advised to come off of his Blood thinner. Pt req a call back to determine how they should move forward. Please call

## 2014-12-18 ENCOUNTER — Other Ambulatory Visit: Payer: Self-pay | Admitting: Physical Medicine & Rehabilitation

## 2014-12-18 NOTE — Telephone Encounter (Signed)
Advised wife

## 2014-12-18 NOTE — Telephone Encounter (Signed)
Left message to call back

## 2014-12-30 ENCOUNTER — Ambulatory Visit (INDEPENDENT_AMBULATORY_CARE_PROVIDER_SITE_OTHER): Payer: Medicare Other | Admitting: Family Medicine

## 2014-12-30 ENCOUNTER — Telehealth: Payer: Self-pay | Admitting: Family Medicine

## 2014-12-30 ENCOUNTER — Encounter: Payer: Self-pay | Admitting: Family Medicine

## 2014-12-30 ENCOUNTER — Other Ambulatory Visit: Payer: Self-pay | Admitting: *Deleted

## 2014-12-30 VITALS — BP 142/74 | HR 68 | Temp 98.1°F | Wt 207.5 lb

## 2014-12-30 DIAGNOSIS — R413 Other amnesia: Secondary | ICD-10-CM

## 2014-12-30 DIAGNOSIS — E11319 Type 2 diabetes mellitus with unspecified diabetic retinopathy without macular edema: Secondary | ICD-10-CM

## 2014-12-30 DIAGNOSIS — E1165 Type 2 diabetes mellitus with hyperglycemia: Secondary | ICD-10-CM

## 2014-12-30 DIAGNOSIS — E7800 Pure hypercholesterolemia, unspecified: Secondary | ICD-10-CM

## 2014-12-30 DIAGNOSIS — E538 Deficiency of other specified B group vitamins: Secondary | ICD-10-CM | POA: Diagnosis not present

## 2014-12-30 DIAGNOSIS — I129 Hypertensive chronic kidney disease with stage 1 through stage 4 chronic kidney disease, or unspecified chronic kidney disease: Secondary | ICD-10-CM | POA: Diagnosis not present

## 2014-12-30 DIAGNOSIS — E1122 Type 2 diabetes mellitus with diabetic chronic kidney disease: Secondary | ICD-10-CM | POA: Diagnosis not present

## 2014-12-30 DIAGNOSIS — N183 Chronic kidney disease, stage 3 (moderate): Secondary | ICD-10-CM

## 2014-12-30 DIAGNOSIS — E1140 Type 2 diabetes mellitus with diabetic neuropathy, unspecified: Secondary | ICD-10-CM

## 2014-12-30 DIAGNOSIS — S4992XD Unspecified injury of left shoulder and upper arm, subsequent encounter: Secondary | ICD-10-CM

## 2014-12-30 DIAGNOSIS — IMO0002 Reserved for concepts with insufficient information to code with codable children: Secondary | ICD-10-CM

## 2014-12-30 LAB — RENAL FUNCTION PANEL
Albumin: 3.8 g/dL (ref 3.5–5.2)
BUN: 23 mg/dL (ref 6–23)
CHLORIDE: 103 meq/L (ref 96–112)
CO2: 25 mEq/L (ref 19–32)
CREATININE: 1.62 mg/dL — AB (ref 0.40–1.50)
Calcium: 9.2 mg/dL (ref 8.4–10.5)
GFR: 43.57 mL/min — AB (ref >60.00)
Glucose, Bld: 311 mg/dL — ABNORMAL HIGH (ref 70–99)
PHOSPHORUS: 4.1 mg/dL (ref 2.3–4.6)
POTASSIUM: 4.4 meq/L (ref 3.5–5.1)
Sodium: 138 mEq/L (ref 135–145)

## 2014-12-30 LAB — HEMOGLOBIN A1C: Hgb A1c MFr Bld: 8.9 % — ABNORMAL HIGH (ref 4.6–6.5)

## 2014-12-30 LAB — LDL CHOLESTEROL, DIRECT: Direct LDL: 55 mg/dL

## 2014-12-30 LAB — VITAMIN B12: VITAMIN B 12: 527 pg/mL (ref 211–911)

## 2014-12-30 MED ORDER — SERTRALINE HCL 25 MG PO TABS: 25.0000 mg | ORAL_TABLET | Freq: Every day | ORAL | Status: DC

## 2014-12-30 MED ORDER — INSULIN LISPRO 100 UNIT/ML ~~LOC~~ SOLN: SUBCUTANEOUS | Status: DC

## 2014-12-30 MED ORDER — CYANOCOBALAMIN 1000 MCG/ML IJ SOLN
1000.0000 ug | Freq: Once | INTRAMUSCULAR | Status: AC
  Administered 2014-12-30: 1000 ug via INTRAMUSCULAR

## 2014-12-30 MED ORDER — INSULIN GLARGINE 100 UNIT/ML ~~LOC~~ SOLN: SUBCUTANEOUS | Status: DC

## 2014-12-30 NOTE — Assessment & Plan Note (Signed)
Persistent, stable. B12 shot today.

## 2014-12-30 NOTE — Assessment & Plan Note (Signed)
B12 IM today.

## 2014-12-30 NOTE — Assessment & Plan Note (Signed)
Recheck today.

## 2014-12-30 NOTE — Addendum Note (Signed)
Addended by: [name] on: 12/30/2014 09:59 AM   Modules accepted: Orders

## 2014-12-30 NOTE — Telephone Encounter (Signed)
At check out Ms Pickeral want to know if Mr Linnemann needed to get a b12 shot next month

## 2014-12-30 NOTE — Assessment & Plan Note (Signed)
Stable on norvasc. No changes today. Consider ACEI down road

## 2014-12-30 NOTE — Assessment & Plan Note (Signed)
Chronic, stable. Check A1c today. Denies any recent lows, but persistent highs. Goal A1c <8% given comorbidities, mainly want to prevent hypoglycemia.

## 2014-12-30 NOTE — Addendum Note (Signed)
Addended by: Royann Shivers A on: 12/30/2014 10:21 AM   Modules accepted: Orders

## 2014-12-30 NOTE — Assessment & Plan Note (Signed)
Exam overall ok - slowly healing. rec against surgery - continue home exercises, tylenol, heating pad.

## 2014-12-30 NOTE — Assessment & Plan Note (Signed)
Chronic, stable on crestor 40m daily. Check dLDL today. Lab Results  Component Value Date   LDLCALC 148* 09/25/2014

## 2014-12-30 NOTE — Progress Notes (Signed)
Pre visit review using our clinic review tool, if applicable. No additional management support is needed unless otherwise documented below in the visit note.

## 2014-12-30 NOTE — Progress Notes (Signed)
BP 142/74 mmHg  Pulse 68  Temp(Src) 98.1 F (36.7 C) (Oral)  Wt 207 lb 8 oz (94.121 kg)   CC: 3 mo f/u visit  Subjective:    Patient ID: Frank Simpson, male    DOB: 03/26/1931, 81 y.o.   MRN: [id number]  HPI: Frank Simpson is a 81 y.o. male presenting on 12/30/2014 for Follow-up   Recent hospitalization 11/2-15 after fall with head injury with traumatic brain injury also with symptomatic bradycardia and complete heart block now s/p pacemaker placement, now off brillinta with cardiology approval. Only on aspirin 14m daily. Has f/u with EP 01/09/2015. Started on seroquel for paranoid thinking. They stopped this med however. Persistent trouble with memory.  Due for B12 shot today.  Presumed BPH - last visit we started finasteride then plan was to taper off flomax.   Fell 5 wks ago while walking inhouse- fell backwards and hit left shoulder. Takes tylenol nightly. Persistent L shoulder pain. Did see Dr BDrema Dallass/p xray for this and completed physical therapy. Worse pain with getting up out of bed. Denies dizziness or premonitory sxs. Persistent L shoulder pain. Denies tremors/shaking. H/o R shoulder surgery.   Not fasting today.  DM - compliant with 20u lantus bid and humalog SSI. Running fasting 163 this morning. Supper time highs 300s. Denies paresthesias.   HLD - compliant with crestor 41mdaily without myalgias.  Relevant past medical, surgical, family and social history reviewed and updated as indicated. Interim medical history since our last visit reviewed. Allergies and medications reviewed and updated. Current Outpatient Prescriptions on File Prior to Visit  Medication Sig  . acetaminophen (TYLENOL) 500 MG tablet Take 500 mg by mouth every 6 (six) hours as needed.  . [name] KitchenmLODipine (NORVASC) 5 MG tablet Take 1 tablet (5 mg total) by mouth daily.  . [name] Kitchenspirin 81 MG chewable tablet Chew 1 tablet (81 mg total) by mouth daily.  . cyanocobalamin (,VITAMIN B-12,) 1000 MCG/ML injection  Inject 1 mL (1,000 mcg total) into the muscle every 30 (thirty) days.  . finasteride (PROSCAR) 5 MG tablet Take 1 tablet (5 mg total) by mouth daily.  . [name] Kitchenlucose blood (ONE TOUCH ULTRA TEST) test strip Check four times daily,Dx E11.319  . Insulin Pen Needle 31G X 8 MM MISC Use as directed with Lantus solostar. Dx 250.62.  . [name] Kitchennsulin Syringe-Needle U-100 (BD INSULIN SYRINGE ULTRAFINE) 31G X 5/16" 0.5 ML MISC Inject 4x a day  . isosorbide mononitrate (IMDUR) 30 MG 24 hr tablet TAKE 1 TABLET BY MOUTH EVERY DAY  . meclizine (ANTIVERT) 25 MG tablet Take 25 mg by mouth every 6 (six) hours as needed for dizziness.  . nitroGLYCERIN (NITROSTAT) 0.4 MG SL tablet Place 1 tablet (0.4 mg total) under the tongue every 5 (five) minutes as needed for chest pain. Up to three doses,if pain continues call 911  . rosuvastatin (CRESTOR) 40 MG tablet Take 1 tablet (40 mg total) by mouth daily.   No current facility-administered medications on file prior to visit.    Review of Systems Per HPI unless specifically indicated above     Objective:    BP 142/74 mmHg  Pulse 68  Temp(Src) 98.1 F (36.7 C) (Oral)  Wt 207 lb 8 oz (94.121 kg)  Wt Readings from Last 3 Encounters:  12/30/14 207 lb 8 oz (94.121 kg)  10/28/14 211 lb 8 oz (95.936 kg)  10/15/14 205 lb 3.2 oz (93.078 kg)    Physical Exam  Constitutional: He appears well-developed and  well-nourished. No distress.  Walks with walker  HENT:  Mouth/Throat: Oropharynx is clear and moist. No oropharyngeal exudate.  Cardiovascular: Normal rate, regular rhythm, normal heart sounds and intact distal pulses.   No murmur heard. Pulmonary/Chest: Effort normal and breath sounds normal. No respiratory distress. He has no wheezes. He has no rales.  Musculoskeletal: He exhibits edema (1+ pedal edema).  Neurological: He is alert.  Doesn't know date  Skin: Skin is warm and dry. No rash noted.  Psychiatric: He has a normal mood and affect.  Nursing note and vitals  reviewed.      Assessment & Plan:   Problem List Items Addressed This Visit    Type 2 diabetes, uncontrolled, with neuropathy    Chronic, stable. Check A1c today. Denies any recent lows, but persistent highs. Goal A1c <8% given comorbidities, mainly want to prevent hypoglycemia.      Relevant Medications   insulin lispro (HUMALOG) 100 UNIT/ML injection   insulin glargine (LANTUS) 100 UNIT/ML injection   Shoulder injury    Exam overall ok - slowly healing. rec against surgery - continue home exercises, tylenol, heating pad.      Memory loss    Persistent, stable. B12 shot today.      Hypercholesterolemia    Chronic, stable on crestor 69m daily. Check dLDL today. Lab Results  Component Value Date   LDLCALC 148* 09/25/2014        Relevant Orders   LDL Cholesterol, Direct   Diabetes mellitus type 2 with retinopathy - Primary   Relevant Medications   insulin lispro (HUMALOG) 100 UNIT/ML injection   insulin glargine (LANTUS) 100 UNIT/ML injection   Other Relevant Orders   Renal function panel   Hemoglobin A1c   CKD stage 3 due to type 2 diabetes mellitus    Recheck today.      Relevant Medications   insulin lispro (HUMALOG) 100 UNIT/ML injection   insulin glargine (LANTUS) 100 UNIT/ML injection   Benign hypertension with CKD (chronic kidney disease) stage III    Stable on norvasc. No changes today. Consider ACEI down road      B12 deficiency    B12 IM today.      Relevant Orders   Vitamin B12       Follow up plan: Return in about 3 months (around 03/30/2015), or as needed, for follow up visit.

## 2014-12-30 NOTE — Patient Instructions (Addendum)
Blood work then B12 shot today. Shoulder seems to be improving - continue home exercises, heating pad, and tylenol as needed. Return in 3 months for follow up

## 2014-12-31 ENCOUNTER — Encounter: Payer: Self-pay | Admitting: Internal Medicine

## 2015-01-02 ENCOUNTER — Other Ambulatory Visit: Payer: Self-pay | Admitting: Family Medicine

## 2015-01-02 NOTE — Telephone Encounter (Signed)
Patient's wife notified to continue B12 injections x6 months. Appt scheduled.

## 2015-01-05 ENCOUNTER — Ambulatory Visit (INDEPENDENT_AMBULATORY_CARE_PROVIDER_SITE_OTHER): Payer: Medicare Other | Admitting: Cardiology

## 2015-01-05 ENCOUNTER — Encounter: Payer: Self-pay | Admitting: Cardiology

## 2015-01-05 VITALS — BP 148/84 | HR 89 | Ht 72.0 in | Wt 211.8 lb

## 2015-01-05 DIAGNOSIS — I5023 Acute on chronic systolic (congestive) heart failure: Secondary | ICD-10-CM

## 2015-01-05 DIAGNOSIS — N183 Chronic kidney disease, stage 3 unspecified: Secondary | ICD-10-CM

## 2015-01-05 DIAGNOSIS — Z950 Presence of cardiac pacemaker: Secondary | ICD-10-CM | POA: Diagnosis not present

## 2015-01-05 DIAGNOSIS — I259 Chronic ischemic heart disease, unspecified: Secondary | ICD-10-CM | POA: Diagnosis not present

## 2015-01-05 DIAGNOSIS — I129 Hypertensive chronic kidney disease with stage 1 through stage 4 chronic kidney disease, or unspecified chronic kidney disease: Secondary | ICD-10-CM

## 2015-01-05 MED ORDER — ISOSORBIDE MONONITRATE ER 30 MG PO TB24: 30.0000 mg | ORAL_TABLET | Freq: Every day | ORAL | Status: DC

## 2015-01-05 MED ORDER — ROSUVASTATIN CALCIUM 40 MG PO TABS: 40.0000 mg | ORAL_TABLET | Freq: Every day | ORAL | Status: DC

## 2015-01-05 NOTE — Patient Instructions (Addendum)
Your physician recommends that you continue on your current medications as directed. Please refer to the Current Medication list given to you today.  Your physician recommends that you schedule a follow-up appointment in: 3 month ov/ekg   Work harder on diet and weight loss

## 2015-01-05 NOTE — Progress Notes (Signed)
Cardiology Office Note   Date:  01/05/2015   ID:  Frank Simpson 1931/07/07, MRN [id number]  PCP:  [name], MD  Cardiologist:   Darlin Coco, MD   No chief complaint on file.     History of Present Illness: Frank Simpson is a 81 y.o. male who presents for a scheduled follow-up office visit.  Frank Simpson is seen back today for a scheduled followup office visit. On 08/06/2012 the patient had a STEMI with DES to the mid LAD.  He was treated with dual antiplatelet therapy for more than a year.  Presently he is on just aspirin alone.. His other issues include CKD, HTN, DM, HLD, OA, urinary incontinence, glaucoma, melanoma and thrombocytopenia.  The patient was hospitalized in November 2015 for dizzy spells and falls.  He was found to have intermittent complete heart block with 8 second pauses.  He was seen by Dr. Lovena Le.  He underwent placement of a dual-chamber St. Jude pacemaker.  Since the pacemaker the patient has not had a further episodes of dizziness or syncope. Since last visit he has been doing well. No recurrent angina. The patient has a history of chronic right-sided chest discomfort secondary to postherpetic neuralgia. He had his episode of shingles about 25 months ago. Since last visit he has not been experiencing any definite recurrent angina pectoris. He is diabetic. He has been having occasional hypoglycemic episodes. He has issues with poor balance. He walks with a cane. He has occasional episodes of vertigo with the room spinning around.  In November 2015 the patient experienced bouts of bradycardia with hypotension with recurrent episodes of intermittent complete heart block while on the monitor. Underwent placement of pacemaker 10/06/2014 per Dr. Osie Cheeks. Echocardiogram with ejection fraction of 95% grade 1 diastolic dysfunction.  Past Medical History  Diagnosis Date  . Hypertension   . Diabetes mellitus type 2 with retinopathy 1994    dm  edu 2012  . Hyperlipidemia   . Osteoarthritis   . CRI (chronic renal insufficiency)     baseline Cr seems to be 1.7-1.8  . Urinary incontinence     s/p PTNS didn't help  . Glaucoma     and cataracts  . History of melanoma   . CAD (coronary artery disease)     07/2012 acute STEMI, mid LAD PCI - DES; cath 09/2012 patent LAD stent, non-hemodynamically significant Left Main/LAD disease, EF 55%  . Thrombocytopenia   . BCC (basal cell carcinoma of skin) 2015    L neck (Dr. Sherrye Payor), L forearm [facility])  . Complication of anesthesia     confused after cath 10/15/2012  . CVA (cerebral infarction) 09/2012    remote anterior limb of left internal capsule  . Post herpetic neuralgia   . Shingles in March 2014    right chest, across the back  . Syncope 04/02/2013  . Blurred vision   . Dysplastic nevus 2015    L upper back, Lat margin involved ([facility])  . CHF (congestive heart failure)   . Ischemic heart disease   . Benign positional vertigo     Past Surgical History  Procedure Laterality Date  . Replacement total knee  04/2010    RIGHT KNEE  . Tonsillectomy    . Cardiovascular stress test  04/27/2010    EF 75%, nuclear stress test with normal perfusion, no ischemia  . Foot surgery      metal pin in place  . Right shoulder    .  Finger surgery      amputated finger  . Lexiscan myoview  10/2011    negative for ischemia  . Cataract extraction  12/12, 1/13    bilateral  . Coronary stent placement  07/2012    DES to mid LAD for STEMI  . Cardiac catheterization  10/15/2012  . Carotid US  03/2013    WNL  . US echocardiography  03/2013    inf/septal hypokinesis, mild LVH, EF 45%, LA mildly dilated  . Penile prosthesis implant    . Left heart catheterization with coronary angiogram N/A 08/06/2012    Procedure: LEFT HEART CATHETERIZATION WITH CORONARY ANGIOGRAM;  Surgeon: Burnell Blanks, MD;  Location: [facility] CATH LAB;  Service: Cardiovascular;  Laterality: N/A;  . Left heart  catheterization with coronary angiogram N/A 10/15/2012    Procedure: LEFT HEART CATHETERIZATION WITH CORONARY ANGIOGRAM;  Surgeon: Peter M Martinique, MD;  Location: [facility] CATH LAB;  Service: Cardiovascular;  Laterality: N/A;  . Permanent pacemaker insertion N/A 10/06/2014    Procedure: PERMANENT PACEMAKER INSERTION;  Surgeon: Evans Lance, MD;  Location: [facility] CATH LAB;  Service: Cardiovascular;  Laterality: N/A;     Current Outpatient Prescriptions  Medication Sig Dispense Refill  . acetaminophen (TYLENOL) 500 MG tablet Take 500 mg by mouth every 6 (six) hours as needed.    [name] Kitchen amLODipine (NORVASC) 5 MG tablet Take 1 tablet (5 mg total) by mouth daily. 90 tablet 0  . aspirin 81 MG chewable tablet Chew 1 tablet (81 mg total) by mouth daily.    . cyanocobalamin (,VITAMIN B-12,) 1000 MCG/ML injection Inject 1 mL (1,000 mcg total) into the muscle every 30 (thirty) days. 1 mL   . finasteride (PROSCAR) 5 MG tablet Take 1 tablet (5 mg total) by mouth daily. 90 tablet 1  . glucose blood (ONE TOUCH ULTRA TEST) test strip Check four times daily,Dx E11.319 120 each 5  . insulin glargine (LANTUS) 100 UNIT/ML injection 22 units twice a day    . insulin lispro (HUMALOG) 100 UNIT/ML injection Sliding scale as directed 5-15 units three times daily before meals. (Patient taking differently: Sliding scale as directed 5-15 units three times daily before meals.Sliding scale: 150-200 2 units; 201-250 4 units; 251-300 6 units; 301-350 8 units; >350 10 units; >450 15 units) 10 mL 3  . Insulin Pen Needle 31G X 8 MM MISC Use as directed with Lantus solostar. Dx 250.62. 100 each 5  . Insulin Syringe-Needle U-100 (BD INSULIN SYRINGE ULTRAFINE) 31G X 5/16" 0.5 ML MISC Inject 4x a day 400 each 3  . isosorbide mononitrate (IMDUR) 30 MG 24 hr tablet Take 1 tablet (30 mg total) by mouth daily. 90 tablet 3  . meclizine (ANTIVERT) 25 MG tablet Take 25 mg by mouth every 6 (six) hours as needed for dizziness.    . nitroGLYCERIN (NITROSTAT)  0.4 MG SL tablet Place 1 tablet (0.4 mg total) under the tongue every 5 (five) minutes as needed for chest pain. Up to three doses,if pain continues call 911 25 tablet 3  . rosuvastatin (CRESTOR) 40 MG tablet Take 1 tablet (40 mg total) by mouth daily. 90 tablet 3  . sertraline (ZOLOFT) 25 MG tablet Take 1 tablet (25 mg total) by mouth daily. 90 tablet 3   No current facility-administered medications for this visit.    Allergies:   Januvia; Januvia; Metformin and related; Metformin and related; Oxycodone; Oxycodone; Penicillins; and Penicillins    Social History:  The patient  reports that he has never  smoked. He has never used smokeless tobacco. He reports that he does not drink alcohol or use illicit drugs.   Family History:  The patient's family history includes Cancer in his father; Diabetes in his brother and mother; Stroke in his mother.    ROS:  Please see the history of present illness.   Otherwise, review of systems are positive for none.   All other systems are reviewed and negative.    PHYSICAL EXAM: VS:  BP 148/84 mmHg  Pulse 89  Ht 6' (1.829 m)  Wt 211 lb 12.8 oz (96.072 kg)  BMI 28.72 kg/m2 , BMI Body mass index is 28.72 kg/(m^2). GEN: Well nourished, well developed, in no acute distress HEENT: normal Neck: no JVD, carotid bruits, or masses Cardiac: RRR; no murmurs, rubs, or gallops,no edema  Respiratory:  clear to auscultation bilaterally, normal work of breathing GI: soft, nontender, nondistended, + BS MS: no deformity or atrophy Skin: warm and dry, no rash Neuro:  Strength and sensation are intact Psych: euthymic mood, full affect   EKG:  EKG is not ordered today. We will plan to get a EKG at his next office visit   Recent Labs: 10/08/2014: ALT 9; Hemoglobin 15.5; Platelets 132* 12/30/2014: BUN 23; Creatinine 1.62*; Potassium 4.4; Sodium 138    Lipid Panel    Component Value Date/Time   CHOL 226* 09/25/2014 0816   TRIG 92 10/06/2014 1402   TRIG 152  11/17/2010   HDL 40.50 09/25/2014 0816   CHOLHDL 6 09/25/2014 0816   VLDL 37.4 09/25/2014 0816   LDLCALC 148* 09/25/2014 0816   LDLDIRECT 55.0 12/30/2014 0936      Wt Readings from Last 3 Encounters:  01/05/15 211 lb 12.8 oz (96.072 kg)  12/30/14 207 lb 8 oz (94.121 kg)  10/28/14 211 lb 8 oz (95.936 kg)      ASSESSMENT AND PLAN:  1. Ischemic heart disease status post STEMI with drug-eluting stent to the mid LAD on 08/06/12. Previously on dual antiplatelet therapy.  He was switched to aspirin alone after his episodes of frequent falls in November 2015 2. Hypertensive heart disease without heart failure 3. Hypercholesterolemia 4. diabetes mellitus adult onset followed by PCP 5. benign positional vertigo 6. postherpetic neuralgia 7. chronic kidney disease 8. Osteoarthritis 9.  Intermittent complete heart block with functioning St. Jude dual-chamber pacemaker implanted by Dr. Lovena Le on 10/06/14.  2 Current medicines are reviewed at length with the patient today.  The patient does not have concerns regarding medicines.  The following changes have been made:  no change  Labs/ tests ordered today include: None  No orders of the defined types were placed in this encounter.     Disposition:   FU with Dr. Mare Ferrari in 3 months for office visit and EKG.   Signed, Darlin Coco, MD  01/05/2015 2:47 PM    [address], [place], [place]  [address] Phone: [phone]; Fax: [phone]

## 2015-01-09 ENCOUNTER — Ambulatory Visit (INDEPENDENT_AMBULATORY_CARE_PROVIDER_SITE_OTHER): Payer: Medicare Other | Admitting: Internal Medicine

## 2015-01-09 ENCOUNTER — Encounter: Payer: Self-pay | Admitting: Internal Medicine

## 2015-01-09 VITALS — BP 196/88 | HR 71 | Ht 72.0 in | Wt 210.8 lb

## 2015-01-09 DIAGNOSIS — R001 Bradycardia, unspecified: Secondary | ICD-10-CM

## 2015-01-09 DIAGNOSIS — N183 Chronic kidney disease, stage 3 (moderate): Secondary | ICD-10-CM

## 2015-01-09 DIAGNOSIS — Z950 Presence of cardiac pacemaker: Secondary | ICD-10-CM

## 2015-01-09 DIAGNOSIS — I129 Hypertensive chronic kidney disease with stage 1 through stage 4 chronic kidney disease, or unspecified chronic kidney disease: Secondary | ICD-10-CM

## 2015-01-09 DIAGNOSIS — I459 Conduction disorder, unspecified: Secondary | ICD-10-CM

## 2015-01-09 DIAGNOSIS — I259 Chronic ischemic heart disease, unspecified: Secondary | ICD-10-CM | POA: Diagnosis not present

## 2015-01-09 LAB — MDC_IDC_ENUM_SESS_TYPE_INCLINIC
Battery Remaining Longevity: 134.4 mo
Battery Voltage: 3.04 V
Brady Statistic RV Percent Paced: 2.2 %
Date Time Interrogation Session: 20160212142753
Implantable Pulse Generator Model: 2240
Implantable Pulse Generator Serial Number: 7666171
Lead Channel Impedance Value: 512.5 Ohm
Lead Channel Pacing Threshold Amplitude: 0.5 V
Lead Channel Pacing Threshold Amplitude: 1 V
Lead Channel Pacing Threshold Pulse Width: 0.4 ms
Lead Channel Sensing Intrinsic Amplitude: 12 mV
Lead Channel Sensing Intrinsic Amplitude: 5 mV
Lead Channel Setting Pacing Amplitude: 2.5 V
Lead Channel Setting Pacing Pulse Width: 0.4 ms
Lead Channel Setting Sensing Sensitivity: 2 mV
MDC IDC MSMT LEADCHNL RA IMPEDANCE VALUE: 450 Ohm
MDC IDC MSMT LEADCHNL RV PACING THRESHOLD PULSEWIDTH: 0.4 ms
MDC IDC SET LEADCHNL RA PACING AMPLITUDE: 2 V
MDC IDC STAT BRADY RA PERCENT PACED: 35 %

## 2015-01-09 NOTE — Patient Instructions (Addendum)
Your physician wants you to follow-up in: 9 months with Chanetta Marshall, NPYou will receive a reminder letter in the mail two months in advance. If you don't receive a letter, please call our office to schedule the follow-up appointment.  Remote monitoring is used to monitor your Pacemaker or ICD from home. This monitoring reduces the number of office visits required to check your device to one time per year. It allows [place] to keep an eye on the functioning of your device to ensure it is working properly. You are scheduled for a device check from home on 04/09/15. You may send your transmission at any time that day. If you have a wireless device, the transmission will be sent automatically. After your physician reviews your transmission, you will receive a postcard with your next transmission date.

## 2015-01-11 DIAGNOSIS — Z950 Presence of cardiac pacemaker: Secondary | ICD-10-CM | POA: Insufficient documentation

## 2015-01-11 NOTE — Progress Notes (Signed)
HPI Frank Simpson returns today for followup. He is an 81 yo man who developed symptomatic bradycardia, s/p PPM insertion, HTN, and DM. He has a h/o CAD. He also has chronic renal insufficiency.  In the interim he has been stable. His blood pressure was initially elevated in the office but is not as high on my evaluation. He denies chest pain, syncope or sob.   Allergies  Allergen Reactions  . Januvia [Sitagliptin Phosphate] Other (See Comments)    Possibly affected kidneys?  Celesta Gentile [Sitagliptin]     Possible affected kidneys?  . Metformin And Related     Affected kidneys  . Oxycodone     Severe confusion  . Penicillins Other (See Comments)    Reaction long time ago - doesn't remember     Current Outpatient Prescriptions  Medication Sig Dispense Refill  . acetaminophen (TYLENOL) 500 MG tablet Take 500 mg by mouth every 6 (six) hours as needed (pain).     [name] Kitchen amLODipine (NORVASC) 5 MG tablet Take 1 tablet (5 mg total) by mouth daily. 90 tablet 0  . aspirin 81 MG chewable tablet Chew 1 tablet (81 mg total) by mouth daily.    . cyanocobalamin (,VITAMIN B-12,) 1000 MCG/ML injection Inject 1 mL (1,000 mcg total) into the muscle every 30 (thirty) days. 1 mL   . finasteride (PROSCAR) 5 MG tablet Take 1 tablet (5 mg total) by mouth daily. 90 tablet 1  . glucose blood (ONE TOUCH ULTRA TEST) test strip Check four times daily,Dx E11.319 120 each 5  . insulin glargine (LANTUS) 100 UNIT/ML injection Inject 22 units into the skin twice a day    . insulin lispro (HUMALOG) 100 UNIT/ML injection Sliding scale as directed 5-15 units three times daily before meals. (Patient taking differently: Sliding scale as directed 5-15 units three times daily before meals.Sliding scale: 150-200 2 units; 201-250 4 units; 251-300 6 units; 301-350 8 units; >350 10 units; >450 15 units) 10 mL 3  . Insulin Pen Needle 31G X 8 MM MISC Use as directed with Lantus solostar. Dx 250.62. 100 each 5  . Insulin Syringe-Needle  U-100 (BD INSULIN SYRINGE ULTRAFINE) 31G X 5/16" 0.5 ML MISC Inject 4x a day 400 each 3  . isosorbide mononitrate (IMDUR) 30 MG 24 hr tablet Take 1 tablet (30 mg total) by mouth daily. 90 tablet 3  . meclizine (ANTIVERT) 25 MG tablet Take 25 mg by mouth every 6 (six) hours as needed for dizziness.    . nitroGLYCERIN (NITROSTAT) 0.4 MG SL tablet Place 1 tablet (0.4 mg total) under the tongue every 5 (five) minutes as needed for chest pain. Up to three doses,if pain continues call 911 25 tablet 3  . rosuvastatin (CRESTOR) 40 MG tablet Take 1 tablet (40 mg total) by mouth daily. 90 tablet 3  . sertraline (ZOLOFT) 25 MG tablet Take 1 tablet (25 mg total) by mouth daily. 90 tablet 3   No current facility-administered medications for this visit.     Past Medical History  Diagnosis Date  . Hypertension   . Diabetes mellitus type 2 with retinopathy 1994    dm edu 2012  . Hyperlipidemia   . Osteoarthritis   . CRI (chronic renal insufficiency)     baseline Cr seems to be 1.7-1.8  . Urinary incontinence     s/p PTNS didn't help  . Glaucoma     and cataracts  . History of melanoma   . CAD (coronary artery  disease)     07/2012 acute STEMI, mid LAD PCI - DES; cath 09/2012 patent LAD stent, non-hemodynamically significant Left Main/LAD disease, EF 55%  . Thrombocytopenia   . BCC (basal cell carcinoma of skin) 2015    L neck (Dr. Sherrye Payor), L forearm [facility])  . Complication of anesthesia     confused after cath 10/15/2012  . CVA (cerebral infarction) 09/2012    remote anterior limb of left internal capsule  . Post herpetic neuralgia   . Shingles in March 2014    right chest, across the back  . Syncope 04/02/2013  . Blurred vision   . Dysplastic nevus 2015    L upper back, Lat margin involved ([facility])  . CHF (congestive heart failure)   . Ischemic heart disease   . Benign positional vertigo     ROS:   All systems reviewed and negative except as noted in the HPI.   Past Surgical  History  Procedure Laterality Date  . Replacement total knee  04/2010    RIGHT KNEE  . Tonsillectomy    . Cardiovascular stress test  04/27/2010    EF 75%, nuclear stress test with normal perfusion, no ischemia  . Foot surgery      metal pin in place  . Right shoulder    . Finger surgery      amputated finger  . Lexiscan myoview  10/2011    negative for ischemia  . Cataract extraction  12/12, 1/13    bilateral  . Coronary stent placement  07/2012    DES to mid LAD for STEMI  . Cardiac catheterization  10/15/2012  . Carotid US  03/2013    WNL  . US echocardiography  03/2013    inf/septal hypokinesis, mild LVH, EF 45%, LA mildly dilated  . Penile prosthesis implant    . Left heart catheterization with coronary angiogram N/A 08/06/2012    Procedure: LEFT HEART CATHETERIZATION WITH CORONARY ANGIOGRAM;  Surgeon: Burnell Blanks, MD;  Location: [facility] CATH LAB;  Service: Cardiovascular;  Laterality: N/A;  . Left heart catheterization with coronary angiogram N/A 10/15/2012    Procedure: LEFT HEART CATHETERIZATION WITH CORONARY ANGIOGRAM;  Surgeon: Peter M Martinique, MD;  Location: [facility] CATH LAB;  Service: Cardiovascular;  Laterality: N/A;  . Permanent pacemaker insertion N/A 10/06/2014    Procedure: PERMANENT PACEMAKER INSERTION;  Surgeon: Evans Lance, MD;  Location: [facility] CATH LAB;  Service: Cardiovascular;  Laterality: N/A;     Family History  Problem Relation Age of Onset  . Stroke Mother     hemorrhage  . Diabetes Mother   . Cancer Father     lung  . Diabetes Brother      History   Social History  . Marital Status: Married    Spouse Name: Pamala Hurry  . Number of Children: 1  . Years of Education: 11   Occupational History  . retired    Social History Main Topics  . Smoking status: Never Smoker   . Smokeless tobacco: Never Used  . Alcohol Use: No  . Drug Use: No  . Sexual Activity: Not Currently   Other Topics Concern  . Not on file   Social History Narrative   **  Merged History Encounter **       Caffeine: 2-3 caffeinated drinks/day   Lives with wife Pamala Hurry , no pets   Occupation: retired, used to work cigarette factory   Edu: 11th grade   Activity: works around house   Diet: healthy  overall.  Good fruits and vegetables, good amt water     BP 196/88 mmHg  Pulse 71  Ht 6' (1.829 m)  Wt 210 lb 12.8 oz (95.618 kg)  BMI 28.58 kg/m2  Physical Exam:  Well appearing NAD HEENT: Unremarkable Neck:  No JVD, no thyromegally Lymphatics:  No adenopathy Back:  No CVA tenderness Lungs:  Clear with no wheezes HEART:  Regular rate rhythm, no murmurs, no rubs, no clicks Abd:  soft, positive bowel sounds, no organomegally, no rebound, no guarding Ext:  2 plus pulses, no edema, no cyanosis, no clubbing Skin:  No rashes no nodules Neuro:  CN II through XII intact, motor grossly intact  DEVICE  Normal device function.  See PaceArt for details.   Assess/Plan:

## 2015-01-11 NOTE — Assessment & Plan Note (Signed)
He has had no recurrent spells since his PPM was inserted. He will continue his current meds. No change in settings.

## 2015-01-11 NOTE — Assessment & Plan Note (Signed)
His St. Jude DDD PM is working normally. Will recheck in several months.

## 2015-01-11 NOTE — Assessment & Plan Note (Signed)
On my exam his pressure was 762'G systolic. He is encouraged to reduce his sodium intake. He has white coat htn.

## 2015-01-12 ENCOUNTER — Ambulatory Visit: Payer: Medicare Other | Admitting: Family Medicine

## 2015-01-15 ENCOUNTER — Other Ambulatory Visit: Payer: Self-pay

## 2015-01-15 MED ORDER — INSULIN GLARGINE 100 UNIT/ML ~~LOC~~ SOLN: SUBCUTANEOUS | Status: DC

## 2015-01-15 NOTE — Telephone Encounter (Signed)
Frank Simpson request 90 day refill lantus to [facility]; advised done.

## 2015-01-27 ENCOUNTER — Ambulatory Visit (INDEPENDENT_AMBULATORY_CARE_PROVIDER_SITE_OTHER): Payer: Medicare Other

## 2015-01-27 DIAGNOSIS — E538 Deficiency of other specified B group vitamins: Secondary | ICD-10-CM

## 2015-01-27 MED ORDER — CYANOCOBALAMIN 1000 MCG/ML IJ SOLN
1000.0000 ug | Freq: Once | INTRAMUSCULAR | Status: AC
  Administered 2015-01-27: 1000 ug via INTRAMUSCULAR

## 2015-02-03 ENCOUNTER — Telehealth: Payer: Self-pay

## 2015-02-03 DIAGNOSIS — R32 Unspecified urinary incontinence: Secondary | ICD-10-CM

## 2015-02-03 MED ORDER — TAMSULOSIN HCL 0.4 MG PO CAPS: 0.4000 mg | ORAL_CAPSULE | Freq: Every day | ORAL | Status: DC

## 2015-02-03 NOTE — Telephone Encounter (Signed)
Frank Simpson left v/m; Frank Simpson had to restart the flomax about one month ago because Frank Simpson could not urinate. Request 90 day refill flomax to [facility]. The finisteride was not effective and Frank Simpson could not urinate.Please advise. Frank Simpson request cb when refilled.

## 2015-02-03 NOTE — Telephone Encounter (Signed)
Called and notified wife as instructed. She verbalized understanding.

## 2015-02-03 NOTE — Telephone Encounter (Signed)
Sent in. plz notify wife.

## 2015-02-05 ENCOUNTER — Other Ambulatory Visit: Payer: Self-pay

## 2015-02-05 MED ORDER — AMLODIPINE BESYLATE 5 MG PO TABS: 5.0000 mg | ORAL_TABLET | Freq: Every day | ORAL | Status: DC

## 2015-02-10 ENCOUNTER — Encounter: Payer: Self-pay | Admitting: Internal Medicine

## 2015-02-27 HISTORY — PX: OTHER SURGICAL HISTORY: SHX169

## 2015-03-03 ENCOUNTER — Encounter: Payer: Self-pay | Admitting: Primary Care

## 2015-03-03 ENCOUNTER — Telehealth: Payer: Self-pay | Admitting: *Deleted

## 2015-03-03 ENCOUNTER — Ambulatory Visit (INDEPENDENT_AMBULATORY_CARE_PROVIDER_SITE_OTHER): Payer: Medicare Other | Admitting: Primary Care

## 2015-03-03 ENCOUNTER — Ambulatory Visit (INDEPENDENT_AMBULATORY_CARE_PROVIDER_SITE_OTHER): Payer: Medicare Other | Admitting: *Deleted

## 2015-03-03 VITALS — BP 146/82 | HR 65 | Temp 97.4°F | Ht 72.0 in | Wt 213.1 lb

## 2015-03-03 DIAGNOSIS — R404 Transient alteration of awareness: Secondary | ICD-10-CM | POA: Diagnosis not present

## 2015-03-03 DIAGNOSIS — I259 Chronic ischemic heart disease, unspecified: Secondary | ICD-10-CM

## 2015-03-03 DIAGNOSIS — D519 Vitamin B12 deficiency anemia, unspecified: Secondary | ICD-10-CM | POA: Diagnosis not present

## 2015-03-03 DIAGNOSIS — L03115 Cellulitis of right lower limb: Secondary | ICD-10-CM

## 2015-03-03 MED ORDER — CYANOCOBALAMIN 1000 MCG/ML IJ SOLN
1000.0000 ug | Freq: Once | INTRAMUSCULAR | Status: AC
  Administered 2015-03-03: 1000 ug via INTRAMUSCULAR

## 2015-03-03 MED ORDER — DOXYCYCLINE HYCLATE 100 MG PO TABS: 100.0000 mg | ORAL_TABLET | Freq: Two times a day (BID) | ORAL | Status: DC

## 2015-03-03 NOTE — Telephone Encounter (Signed)
Pt came in today for b12 inj, and wife mentioned that pt had fallen a few days ago and asked me to look at the area to see if I thought he needed an appt. His right lower leg had redness, was swollen, tender and warm to the touch. Per wife he has not run a fever. I advised them to stop by the front desk to schedule an appt. Pt is scheduled with Anda Kraft today at 9:30.

## 2015-03-03 NOTE — Telephone Encounter (Signed)
Noted. Thanks.

## 2015-03-03 NOTE — Progress Notes (Signed)
Subjective:    Patient ID: Frank Simpson, male    DOB: 06/19/1931, 81 y.o.   MRN: [id number]  HPI  Frank Simpson is an 81 year old male who presents today with a chief complaint of redness, swelling, and tenderness to right lower extremity since falling with a ladder (at ground level)10 days ago. He reports falling forward with a ladder when moving it and hit it with his right leg. Since his fall he's experienced worsening swelling and redness with a "tight" feeling over his skin. He is sore when standing initially but will feel better after walking. His last tetanus shot was 09/2014. He not taken any medication for his pain or swelling and has not used ice. His wife as been keeping the (now healing abrasion) clean with alcohol. Denies fevers, chills, chest pain, calf pain with swelling, deformity, or shortness of breath. He's been able to bear weight on the affected extremity since the incident.  Review of Systems  Constitutional: Negative for fever and chills.  Respiratory: Negative for shortness of breath.   Cardiovascular: Negative for chest pain.  Musculoskeletal: Positive for gait problem.       Multiple falls in the past month. No difficulty ambulating with right leg.  Skin: Positive for wound.       Past Medical History  Diagnosis Date  . Hypertension   . Diabetes mellitus type 2 with retinopathy 1994    dm edu 2012  . Hyperlipidemia   . Osteoarthritis   . CRI (chronic renal insufficiency)     baseline Cr seems to be 1.7-1.8  . Urinary incontinence     s/p PTNS didn't help  . Glaucoma     and cataracts  . History of melanoma   . CAD (coronary artery disease)     07/2012 acute STEMI, mid LAD PCI - DES; cath 09/2012 patent LAD stent, non-hemodynamically significant Left Main/LAD disease, EF 55%  . Thrombocytopenia   . BCC (basal cell carcinoma of skin) 2015    L neck (Dr. Sherrye Payor), L forearm [facility])  . Complication of anesthesia     confused after cath 10/15/2012  .  CVA (cerebral infarction) 09/2012    remote anterior limb of left internal capsule  . Post herpetic neuralgia   . Shingles in March 2014    right chest, across the back  . Syncope 04/02/2013  . Blurred vision   . Dysplastic nevus 2015    L upper back, Lat margin involved ([facility])  . CHF (congestive heart failure)   . Ischemic heart disease   . Benign positional vertigo     History   Social History  . Marital Status: Married    Spouse Name: Frank Simpson  . Number of Children: 1  . Years of Education: 11   Occupational History  . retired    Social History Main Topics  . Smoking status: Never Smoker   . Smokeless tobacco: Never Used  . Alcohol Use: No  . Drug Use: No  . Sexual Activity: Not Currently   Other Topics Concern  . Not on file   Social History Narrative   ** Merged History Encounter **       Caffeine: 2-3 caffeinated drinks/day   Lives with wife Frank Simpson , no pets   Occupation: retired, used to work cigarette factory   Edu: 11th grade   Activity: works around house   Diet: healthy overall.  Good fruits and vegetables, good amt water    Past Surgical History  Procedure Laterality Date  . Replacement total knee  04/2010    RIGHT KNEE  . Tonsillectomy    . Cardiovascular stress test  04/27/2010    EF 75%, nuclear stress test with normal perfusion, no ischemia  . Foot surgery      metal pin in place  . Right shoulder    . Finger surgery      amputated finger  . Lexiscan myoview  10/2011    negative for ischemia  . Cataract extraction  12/12, 1/13    bilateral  . Coronary stent placement  07/2012    DES to mid LAD for STEMI  . Cardiac catheterization  10/15/2012  . Carotid US  03/2013    WNL  . US echocardiography  03/2013    inf/septal hypokinesis, mild LVH, EF 45%, LA mildly dilated  . Penile prosthesis implant    . Left heart catheterization with coronary angiogram N/A 08/06/2012    Procedure: LEFT HEART CATHETERIZATION WITH CORONARY ANGIOGRAM;   Surgeon: Burnell Blanks, MD;  Location: [facility] CATH LAB;  Service: Cardiovascular;  Laterality: N/A;  . Left heart catheterization with coronary angiogram N/A 10/15/2012    Procedure: LEFT HEART CATHETERIZATION WITH CORONARY ANGIOGRAM;  Surgeon: Peter M Martinique, MD;  Location: [facility] CATH LAB;  Service: Cardiovascular;  Laterality: N/A;  . Permanent pacemaker insertion N/A 10/06/2014    Procedure: PERMANENT PACEMAKER INSERTION;  Surgeon: Evans Lance, MD;  Location: [facility] CATH LAB;  Service: Cardiovascular;  Laterality: N/A;    Family History  Problem Relation Age of Onset  . Stroke Mother     hemorrhage  . Diabetes Mother   . Cancer Father     lung  . Diabetes Brother     Allergies  Allergen Reactions  . Januvia [Sitagliptin Phosphate] Other (See Comments)    Possibly affected kidneys?  Celesta Gentile [Sitagliptin]     Possible affected kidneys?  . Metformin And Related     Affected kidneys  . Oxycodone     Severe confusion  . Penicillins Other (See Comments)    Reaction long time ago - doesn't remember    Current Outpatient Prescriptions on File Prior to Visit  Medication Sig Dispense Refill  . acetaminophen (TYLENOL) 500 MG tablet Take 500 mg by mouth every 6 (six) hours as needed (pain).     [name] Kitchen amLODipine (NORVASC) 5 MG tablet Take 1 tablet (5 mg total) by mouth daily. 90 tablet 2  . aspirin 81 MG chewable tablet Chew 1 tablet (81 mg total) by mouth daily.    . cyanocobalamin (,VITAMIN B-12,) 1000 MCG/ML injection Inject 1 mL (1,000 mcg total) into the muscle every 30 (thirty) days. 1 mL   . glucose blood (ONE TOUCH ULTRA TEST) test strip Check four times daily,Dx E11.319 120 each 5  . insulin glargine (LANTUS) 100 UNIT/ML injection Inject 22 units into the skin twice a day 40 mL 1  . insulin lispro (HUMALOG) 100 UNIT/ML injection Sliding scale as directed 5-15 units three times daily before meals. (Patient taking differently: Sliding scale as directed 5-15 units three times daily  before meals.Sliding scale: 150-200 2 units; 201-250 4 units; 251-300 6 units; 301-350 8 units; >350 10 units; >450 15 units) 10 mL 3  . Insulin Pen Needle 31G X 8 MM MISC Use as directed with Lantus solostar. Dx 250.62. 100 each 5  . Insulin Syringe-Needle U-100 (BD INSULIN SYRINGE ULTRAFINE) 31G X 5/16" 0.5 ML MISC Inject 4x a day 400 each 3  .  isosorbide mononitrate (IMDUR) 30 MG 24 hr tablet Take 1 tablet (30 mg total) by mouth daily. 90 tablet 3  . meclizine (ANTIVERT) 25 MG tablet Take 25 mg by mouth every 6 (six) hours as needed for dizziness.    . nitroGLYCERIN (NITROSTAT) 0.4 MG SL tablet Place 1 tablet (0.4 mg total) under the tongue every 5 (five) minutes as needed for chest pain. Up to three doses,if pain continues call 911 25 tablet 3  . rosuvastatin (CRESTOR) 40 MG tablet Take 1 tablet (40 mg total) by mouth daily. 90 tablet 3  . sertraline (ZOLOFT) 25 MG tablet Take 1 tablet (25 mg total) by mouth daily. 90 tablet 3  . tamsulosin (FLOMAX) 0.4 MG CAPS capsule Take 1 capsule (0.4 mg total) by mouth daily. 90 capsule 3   No current facility-administered medications on file prior to visit.    BP 146/82 mmHg  Pulse 65  Temp(Src) 97.4 F (36.3 C) (Oral)  Ht 6' (1.829 m)  Wt 213 lb 1.9 oz (96.671 kg)  BMI 28.90 kg/m2  SpO2 97%    Objective:   Physical Exam  Constitutional: He is oriented to person, place, and time.  Neck: Neck supple.  Cardiovascular: Normal rate and regular rhythm.   Pulmonary/Chest: Effort normal and breath sounds normal.  Musculoskeletal: He exhibits tenderness.  Present to RLE  Lymphadenopathy:    He has no cervical adenopathy.  Neurological: He is alert and oriented to person, place, and time.  Skin: Skin is warm and dry. There is erythema.     Two 4-5cm healing abrasions to RLE. Erythema present to anterior surface with slight movement laterally.  Psychiatric: He has a normal mood and affect.          Assessment & Plan:

## 2015-03-03 NOTE — Assessment & Plan Note (Signed)
Moderate erythema to anterior lower extremity. Do no suspect DVT. RX for Doxycycline BID for 10 days. Follow up in one week for re-evaluation or call sooner if worsening.

## 2015-03-03 NOTE — Patient Instructions (Signed)
Start Doxycycline antibiotic today. Take one tablet by mouth twice daily for skin infection. Please make a follow up appointment with me in one week for re-evaluation. Call me if you have any questions!

## 2015-03-03 NOTE — Progress Notes (Signed)
Pre visit review using our clinic review tool, if applicable. No additional management support is needed unless otherwise documented below in the visit note.

## 2015-03-10 ENCOUNTER — Encounter: Payer: Self-pay | Admitting: Primary Care

## 2015-03-10 ENCOUNTER — Inpatient Hospital Stay (HOSPITAL_COMMUNITY): Payer: Medicare Other

## 2015-03-10 ENCOUNTER — Inpatient Hospital Stay (HOSPITAL_COMMUNITY)
Admission: EM | Admit: 2015-03-10 | Discharge: 2015-03-12 | DRG: 638 | Disposition: A | Discharge status: Home / Self Care | Payer: Medicare Other | Attending: Internal Medicine | Admitting: Internal Medicine

## 2015-03-10 ENCOUNTER — Ambulatory Visit (INDEPENDENT_AMBULATORY_CARE_PROVIDER_SITE_OTHER): Payer: Medicare Other | Admitting: Primary Care

## 2015-03-10 ENCOUNTER — Emergency Department (HOSPITAL_COMMUNITY): Payer: Medicare Other

## 2015-03-10 ENCOUNTER — Encounter (HOSPITAL_COMMUNITY): Payer: Self-pay | Admitting: *Deleted

## 2015-03-10 VITALS — BP 136/72 | HR 82 | Temp 98.1°F | Ht 72.0 in | Wt 211.8 lb

## 2015-03-10 DIAGNOSIS — E860 Dehydration: Secondary | ICD-10-CM | POA: Diagnosis present

## 2015-03-10 DIAGNOSIS — L03115 Cellulitis of right lower limb: Secondary | ICD-10-CM | POA: Diagnosis not present

## 2015-03-10 DIAGNOSIS — R4189 Other symptoms and signs involving cognitive functions and awareness: Secondary | ICD-10-CM

## 2015-03-10 DIAGNOSIS — I5022 Chronic systolic (congestive) heart failure: Secondary | ICD-10-CM | POA: Diagnosis present

## 2015-03-10 DIAGNOSIS — I2510 Atherosclerotic heart disease of native coronary artery without angina pectoris: Secondary | ICD-10-CM | POA: Diagnosis not present

## 2015-03-10 DIAGNOSIS — E11649 Type 2 diabetes mellitus with hypoglycemia without coma: Secondary | ICD-10-CM | POA: Diagnosis not present

## 2015-03-10 DIAGNOSIS — I259 Chronic ischemic heart disease, unspecified: Secondary | ICD-10-CM | POA: Diagnosis not present

## 2015-03-10 DIAGNOSIS — Z8673 Personal history of transient ischemic attack (TIA), and cerebral infarction without residual deficits: Secondary | ICD-10-CM

## 2015-03-10 DIAGNOSIS — R55 Syncope and collapse: Secondary | ICD-10-CM

## 2015-03-10 DIAGNOSIS — N179 Acute kidney failure, unspecified: Secondary | ICD-10-CM | POA: Diagnosis present

## 2015-03-10 DIAGNOSIS — R296 Repeated falls: Secondary | ICD-10-CM | POA: Diagnosis present

## 2015-03-10 DIAGNOSIS — N183 Chronic kidney disease, stage 3 (moderate): Secondary | ICD-10-CM | POA: Diagnosis not present

## 2015-03-10 DIAGNOSIS — I639 Cerebral infarction, unspecified: Secondary | ICD-10-CM | POA: Diagnosis not present

## 2015-03-10 DIAGNOSIS — G459 Transient cerebral ischemic attack, unspecified: Secondary | ICD-10-CM | POA: Diagnosis not present

## 2015-03-10 DIAGNOSIS — Z950 Presence of cardiac pacemaker: Secondary | ICD-10-CM | POA: Diagnosis not present

## 2015-03-10 DIAGNOSIS — E785 Hyperlipidemia, unspecified: Secondary | ICD-10-CM | POA: Diagnosis present

## 2015-03-10 DIAGNOSIS — I129 Hypertensive chronic kidney disease with stage 1 through stage 4 chronic kidney disease, or unspecified chronic kidney disease: Secondary | ICD-10-CM | POA: Diagnosis not present

## 2015-03-10 DIAGNOSIS — E11319 Type 2 diabetes mellitus with unspecified diabetic retinopathy without macular edema: Secondary | ICD-10-CM | POA: Diagnosis not present

## 2015-03-10 DIAGNOSIS — H8110 Benign paroxysmal vertigo, unspecified ear: Secondary | ICD-10-CM | POA: Diagnosis present

## 2015-03-10 DIAGNOSIS — R531 Weakness: Secondary | ICD-10-CM

## 2015-03-10 DIAGNOSIS — F0390 Unspecified dementia without behavioral disturbance: Secondary | ICD-10-CM | POA: Diagnosis present

## 2015-03-10 DIAGNOSIS — R32 Unspecified urinary incontinence: Secondary | ICD-10-CM | POA: Diagnosis present

## 2015-03-10 DIAGNOSIS — Z794 Long term (current) use of insulin: Secondary | ICD-10-CM | POA: Diagnosis not present

## 2015-03-10 DIAGNOSIS — E1165 Type 2 diabetes mellitus with hyperglycemia: Secondary | ICD-10-CM | POA: Diagnosis present

## 2015-03-10 DIAGNOSIS — R404 Transient alteration of awareness: Secondary | ICD-10-CM

## 2015-03-10 DIAGNOSIS — Z7982 Long term (current) use of aspirin: Secondary | ICD-10-CM

## 2015-03-10 DIAGNOSIS — R4182 Altered mental status, unspecified: Secondary | ICD-10-CM | POA: Diagnosis not present

## 2015-03-10 DIAGNOSIS — E7800 Pure hypercholesterolemia, unspecified: Secondary | ICD-10-CM | POA: Diagnosis present

## 2015-03-10 DIAGNOSIS — E162 Hypoglycemia, unspecified: Secondary | ICD-10-CM | POA: Insufficient documentation

## 2015-03-10 DIAGNOSIS — I6350 Cerebral infarction due to unspecified occlusion or stenosis of unspecified cerebral artery: Secondary | ICD-10-CM | POA: Diagnosis not present

## 2015-03-10 LAB — URINALYSIS, ROUTINE W REFLEX MICROSCOPIC
BILIRUBIN URINE: NEGATIVE
Glucose, UA: 100 mg/dL — AB
HGB URINE DIPSTICK: NEGATIVE
KETONES UR: 15 mg/dL — AB
Leukocytes, UA: NEGATIVE
Nitrite: NEGATIVE
Protein, ur: 100 mg/dL — AB
SPECIFIC GRAVITY, URINE: 1.02 (ref 1.005–1.030)
UROBILINOGEN UA: 0.2 mg/dL (ref 0.0–1.0)
pH: 5.5 (ref 5.0–8.0)

## 2015-03-10 LAB — COMPREHENSIVE METABOLIC PANEL
ALT: 17 U/L (ref 0–53)
AST: 24 U/L (ref 0–37)
Albumin: 3.3 g/dL — ABNORMAL LOW (ref 3.5–5.2)
Alkaline Phosphatase: 62 U/L (ref 39–117)
Anion gap: 9 (ref 5–15)
BUN: 23 mg/dL (ref 6–23)
CO2: 26 mmol/L (ref 19–32)
Calcium: 9 mg/dL (ref 8.4–10.5)
Chloride: 105 mmol/L (ref 96–112)
Creatinine, Ser: 1.89 mg/dL — ABNORMAL HIGH (ref 0.50–1.35)
GFR calc Af Amer: 36 mL/min — ABNORMAL LOW (ref >90)
GFR calc non Af Amer: 31 mL/min — ABNORMAL LOW (ref >90)
Glucose, Bld: 88 mg/dL (ref 70–99)
Potassium: 3.9 mmol/L (ref 3.5–5.1)
Sodium: 140 mmol/L (ref 135–145)
Total Bilirubin: 0.6 mg/dL (ref 0.3–1.2)
Total Protein: 6.5 g/dL (ref 6.0–8.3)

## 2015-03-10 LAB — CBG MONITORING, ED
Glucose-Capillary: 143 mg/dL — ABNORMAL HIGH (ref 70–99)
Glucose-Capillary: 79 mg/dL (ref 70–99)
Glucose-Capillary: 140 mg/dL — ABNORMAL HIGH (ref 70–99)

## 2015-03-10 LAB — DIFFERENTIAL
Basophils Absolute: 0 10*3/uL (ref 0.0–0.1)
Basophils Relative: 0 % (ref 0–1)
Eosinophils Absolute: 0.1 10*3/uL (ref 0.0–0.7)
Eosinophils Relative: 1 % (ref 0–5)
Lymphocytes Relative: 28 % (ref 12–46)
Lymphs Abs: 2.3 10*3/uL (ref 0.7–4.0)
Monocytes Absolute: 0.6 10*3/uL (ref 0.1–1.0)
Monocytes Relative: 8 % (ref 3–12)
Neutro Abs: 5.4 10*3/uL (ref 1.7–7.7)
Neutrophils Relative %: 63 % (ref 43–77)

## 2015-03-10 LAB — URINE MICROSCOPIC-ADD ON

## 2015-03-10 LAB — CBC
HCT: 44.6 % (ref 39.0–52.0)
Hemoglobin: 15.8 g/dL (ref 13.0–17.0)
MCH: 31.3 pg (ref 26.0–34.0)
MCHC: 35.4 g/dL (ref 30.0–36.0)
MCV: 88.3 fL (ref 78.0–100.0)
Platelets: 150 10*3/uL (ref 150–400)
RBC: 5.05 MIL/uL (ref 4.22–5.81)
RDW: 13.8 % (ref 11.5–15.5)
WBC: 8.4 10*3/uL (ref 4.0–10.5)

## 2015-03-10 LAB — GLUCOSE, CAPILLARY
Glucose-Capillary: 99 mg/dL (ref 70–99)
Glucose-Capillary: 93 mg/dL (ref 70–99)

## 2015-03-10 LAB — GLUCOSE, POCT (MANUAL RESULT ENTRY): POC Glucose: 85 mg/dl (ref 70–99)

## 2015-03-10 LAB — APTT: aPTT: 25 seconds (ref 24–37)

## 2015-03-10 LAB — I-STAT TROPONIN, ED: Troponin i, poc: 0.01 ng/mL (ref 0.00–0.08)

## 2015-03-10 LAB — PROTIME-INR
INR: 1.1 (ref 0.00–1.49)
Prothrombin Time: 14.4 seconds (ref 11.6–15.2)

## 2015-03-10 MED ORDER — HEPARIN SODIUM (PORCINE) 5000 UNIT/ML IJ SOLN
5000.0000 [IU] | Freq: Three times a day (TID) | INTRAMUSCULAR | Status: DC
  Administered 2015-03-10 – 2015-03-12 (×6): 5000 [IU] via SUBCUTANEOUS
  Filled 2015-03-10 (×6): qty 1

## 2015-03-10 MED ORDER — DEXTROSE 50 % IV SOLN
25.0000 mL | Freq: Once | INTRAVENOUS | Status: AC
  Administered 2015-03-10: 25 mL via INTRAVENOUS

## 2015-03-10 MED ORDER — MECLIZINE HCL 12.5 MG PO TABS: 25.0000 mg | ORAL_TABLET | Freq: Four times a day (QID) | ORAL | Status: DC | PRN

## 2015-03-10 MED ORDER — SENNOSIDES-DOCUSATE SODIUM 8.6-50 MG PO TABS: 1.0000 | ORAL_TABLET | Freq: Every evening | ORAL | Status: DC | PRN

## 2015-03-10 MED ORDER — INSULIN ASPART 100 UNIT/ML ~~LOC~~ SOLN
0.0000 [IU] | SUBCUTANEOUS | Status: DC
  Administered 2015-03-11 (×2): 3 [IU] via SUBCUTANEOUS
  Administered 2015-03-12: 2 [IU] via SUBCUTANEOUS

## 2015-03-10 MED ORDER — DOXYCYCLINE HYCLATE 100 MG IV SOLR
100.0000 mg | Freq: Two times a day (BID) | INTRAVENOUS | Status: DC
  Administered 2015-03-10 – 2015-03-11 (×3): 100 mg via INTRAVENOUS
  Filled 2015-03-10 (×5): qty 100

## 2015-03-10 MED ORDER — TAMSULOSIN HCL 0.4 MG PO CAPS
0.4000 mg | ORAL_CAPSULE | Freq: Every day | ORAL | Status: DC
  Administered 2015-03-11 – 2015-03-12 (×2): 0.4 mg via ORAL
  Filled 2015-03-10 (×2): qty 1

## 2015-03-10 MED ORDER — ASPIRIN 81 MG PO CHEW: 81.0000 mg | CHEWABLE_TABLET | Freq: Every day | ORAL | Status: DC

## 2015-03-10 MED ORDER — ROSUVASTATIN CALCIUM 40 MG PO TABS
40.0000 mg | ORAL_TABLET | Freq: Every day | ORAL | Status: DC
  Administered 2015-03-11 – 2015-03-12 (×2): 40 mg via ORAL
  Filled 2015-03-10 (×2): qty 1

## 2015-03-10 MED ORDER — DEXTROSE 50 % IV SOLN
INTRAVENOUS | Status: AC
Start: 2015-03-10 — End: 2015-03-11
  Filled 2015-03-10: qty 50

## 2015-03-10 MED ORDER — ASPIRIN EC 81 MG PO TBEC
81.0000 mg | DELAYED_RELEASE_TABLET | Freq: Every day | ORAL | Status: DC
  Administered 2015-03-11 – 2015-03-12 (×2): 81 mg via ORAL
  Filled 2015-03-10 (×2): qty 1

## 2015-03-10 MED ORDER — DOXYCYCLINE HYCLATE 100 MG PO TABS: 100.0000 mg | ORAL_TABLET | Freq: Two times a day (BID) | ORAL | Status: DC

## 2015-03-10 MED ORDER — SERTRALINE HCL 50 MG PO TABS
25.0000 mg | ORAL_TABLET | Freq: Every day | ORAL | Status: DC
  Administered 2015-03-11 – 2015-03-12 (×2): 25 mg via ORAL
  Filled 2015-03-10 (×2): qty 1

## 2015-03-10 MED ORDER — NITROGLYCERIN 0.4 MG SL SUBL: 0.4000 mg | SUBLINGUAL_TABLET | SUBLINGUAL | Status: DC | PRN

## 2015-03-10 MED ORDER — SODIUM CHLORIDE 0.9 % IV SOLN
INTRAVENOUS | Status: AC
  Administered 2015-03-10: 18:00:00 via INTRAVENOUS

## 2015-03-10 MED ORDER — INSULIN GLARGINE 100 UNIT/ML ~~LOC~~ SOLN
20.0000 [IU] | Freq: Two times a day (BID) | SUBCUTANEOUS | Status: DC
  Administered 2015-03-11 – 2015-03-12 (×3): 20 [IU] via SUBCUTANEOUS
  Filled 2015-03-10 (×5): qty 0.2

## 2015-03-10 MED ORDER — LEVOFLOXACIN 500 MG PO TABS: 500.0000 mg | ORAL_TABLET | Freq: Every day | ORAL | Status: DC

## 2015-03-10 MED ORDER — HYDRALAZINE HCL 20 MG/ML IJ SOLN: 10.0000 mg | Freq: Four times a day (QID) | INTRAMUSCULAR | Status: DC | PRN

## 2015-03-10 MED ORDER — STROKE: EARLY STAGES OF RECOVERY BOOK
Freq: Once | Status: AC
  Administered 2015-03-10: 16:00:00

## 2015-03-10 MED ORDER — CYANOCOBALAMIN 1000 MCG/ML IJ SOLN: 1000.0000 ug | INTRAMUSCULAR | Status: DC

## 2015-03-10 NOTE — Assessment & Plan Note (Signed)
During exam patient suddenly became unresponsive and leaned backward on the exam table from a sitting position. CBG 85. Unable to follow commands. Wife reported much later that she remembered that he'd been complaining of a headache since a fall 2 weeks ago. After Apple Juice, patient re-evaluated and no improvement. Sent to [facility] emergency department via EMS.

## 2015-03-10 NOTE — H&P (Signed)
[facility] History and Physical                                                                                    Frank Simpson, is a 81 y.o. male  MRN: [id number]   DOB - Jul 26, 1931  Admit Date - 03/10/2015  Outpatient Primary MD for the patient is Frank Bush, MD  Chief Complaint:   Chief Complaint  Patient presents with  . Code Stroke     HPI  Frank Simpson  is a 81 y.o. male, with a past medical history of insulin-dependent diabetes mellitus, chronic renal insufficiency, coronary artery disease, previous CVA, and hypertension as well as CHF. He presents from his primary care physician's office after a syncopal episode. Per Frank Simpson her husband went to his PCP for a follow-up visit after being treated for right lower extremity cellulitis. Towards the end of the visit her husband became lethargic, pale, and diaphoretic.  He laid down on the exam table and would not follow commands for approximately 30 minutes. Her husband is normally incontinent of urine, there was no tongue bite or convulsive movements.  In the emergency department labs are essentially within normal limits other than a creatinine that is mildly elevated from baseline. Urinalysis is pending. CT head shows no acute intracranial abnormality. The patient is unable to receive an MRI as he has a pacemaker. He was seen by neurology who recommends admission for possible TIA.  His wife reports that since November when he had a concussion he has been having episodes of being "swimmy headed" and he has frequent falls. This week he has fallen twice while in the bathroom.  Review of Systems   In addition to the HPI above,  ++ for headache 4/11 (now resolved.) No problems swallowing food or Liquids, No Chest pain, Cough or Shortness of Breath, No Abdominal pain, No Nausea or Vomiting, Bowel movements are regular,  Eating well. No Blood in stool or Urine, No dysuria, No new skin rashes or bruises, No new joints  pains-aches,  No new weakness, tingling, numbness in any extremity, No recent weight gain or loss, A full 10 point Review of Systems was done, except as stated above, all other Review of Systems were negative.  Social History History  Substance Use Topics  . Smoking status: Never Smoker   . Smokeless tobacco: Never Used  . Alcohol Use: No    Family History Family History  Problem Relation Age of Onset  . Stroke Mother     hemorrhage  . Diabetes Mother   . Cancer Father     lung  . Diabetes Brother   Mother had a brain tumor in her 39s, hemorrhagic stroke at 21 and passed at 58.  Prior to Admission medications   Medication Sig Start Date End Date Taking? Authorizing Provider  acetaminophen (TYLENOL) 500 MG tablet Take 500 mg by mouth every 6 (six) hours as needed (pain).    Yes Historical Provider, MD  amLODipine (NORVASC) 5 MG tablet Take 1 tablet (5 mg total) by mouth daily. 02/05/15  Yes Darlin Coco, MD  aspirin 81 MG chewable tablet Chew 1 tablet (81 mg total) by  mouth daily. 10/17/14  Yes Daniel J Angiulli, PA-C  cyanocobalamin (,VITAMIN B-12,) 1000 MCG/ML injection Inject 1 mL (1,000 mcg total) into the muscle every 30 (thirty) days. 04/23/14  Yes Frank Bush, MD  doxycycline (VIBRA-TABS) 100 MG tablet Take 1 tablet (100 mg total) by mouth 2 (two) times daily. 03/03/15  Yes Frank Friendly, NP  glucose blood (ONE TOUCH ULTRA TEST) test strip Check four times daily,Dx E11.319 09/02/14  Yes Frank Bush, MD  insulin glargine (LANTUS) 100 UNIT/ML injection Inject 22 units into the skin twice a day 01/15/15  Yes Frank Bush, MD  insulin lispro (HUMALOG) 100 UNIT/ML injection Sliding scale as directed 5-15 units three times daily before meals. Patient taking differently: Sliding scale as directed 5-15 units three times daily before meals.Sliding scale: 150-200 2 units; 201-250 4 units; 251-300 6 units; 301-350 8 units; >350 10 units; >450 15 units 12/30/14  Yes Frank Bush, MD  Insulin Pen Needle 31G X 8 MM MISC Use as directed with Lantus solostar. Dx 250.62. 07/11/13  Yes Frank Bush, MD  Insulin Syringe-Needle U-100 (BD INSULIN SYRINGE ULTRAFINE) 31G X 5/16" 0.5 ML MISC Inject 4x a day 12/04/13  Yes Philemon Kingdom, MD  isosorbide mononitrate (IMDUR) 30 MG 24 hr tablet Take 1 tablet (30 mg total) by mouth daily. 01/05/15  Yes Darlin Coco, MD  meclizine (ANTIVERT) 25 MG tablet Take 25 mg by mouth every 6 (six) hours as needed for dizziness.   Yes Historical Provider, MD  nitroGLYCERIN (NITROSTAT) 0.4 MG SL tablet Place 1 tablet (0.4 mg total) under the tongue every 5 (five) minutes as needed for chest pain. Up to three doses,if pain continues call 911 10/03/14 12/02/16 Yes Darlin Coco, MD  rosuvastatin (CRESTOR) 40 MG tablet Take 1 tablet (40 mg total) by mouth daily. 01/05/15  Yes Darlin Coco, MD  sertraline (ZOLOFT) 25 MG tablet Take 1 tablet (25 mg total) by mouth daily. 12/30/14  Yes Frank Bush, MD  tamsulosin (FLOMAX) 0.4 MG CAPS capsule Take 1 capsule (0.4 mg total) by mouth daily. 02/03/15  Yes Frank Bush, MD    Allergies  Allergen Reactions  . Januvia [Sitagliptin Phosphate] Other (See Comments)    Possibly affected kidneys?  Celesta Gentile [Sitagliptin]     Possible affected kidneys?  . Metformin And Related     Affected kidneys  . Oxycodone     Severe confusion  . Penicillins Other (See Comments)    Reaction long time ago - doesn't remember    Physical Exam  Vitals  Blood pressure 151/77, pulse 67, temperature 97.9 F (36.6 C), temperature source Oral, resp. rate 16, height 6' (1.829 m), weight 92.999 kg (205 lb 0.4 oz), SpO2 96 %.   General: Well developed, Wn elderly male lying in bed in NAD, pleasantly demented,  generally quiet. Wife at bedside.  Neuro:   No F.N deficits, ALL C.Nerves Intact, Strength 5/5 all 4 extremities, Sensation intact all 4 extremities.  ENT:  Ears and Eyes appear Normal, Conjunctivae  clear, PER. Dry mucous membranes.  Neck:  Supple, No lymphadenopathy appreciated  Respiratory:  Symmetrical chest wall movement, Good air movement bilaterally, CTAB.  Cardiac:  RRR, No Murmurs, no LE edema noted, no JVD.    Abdomen:  Positive bowel sounds, Soft, Non tender, Non distended,  No masses appreciated  Skin:  No Cyanosis, Normal Skin Turgor, No Skin Rash or Bruise.  Extremities:  Right lower extremity with healing laceration surrounded by some resolving erythema. No other bruises, rashes or  lesions noted.  Data Review  CBC  Recent Labs Lab 03/10/15 1209  WBC 8.4  HGB 15.8  HCT 44.6  PLT 150  MCV 88.3  MCH 31.3  MCHC 35.4  RDW 13.8  LYMPHSABS 2.3  MONOABS 0.6  EOSABS 0.1  BASOSABS 0.0    Chemistries   Recent Labs Lab 03/10/15 1209  NA 140  K 3.9  CL 105  CO2 26  GLUCOSE 88  BUN 23  CREATININE 1.89*  CALCIUM 9.0  AST 24  ALT 17  ALKPHOS 62  BILITOT 0.6    Coagulation profile  Recent Labs Lab 03/10/15 1209  INR 1.10    Urinalysis: pending  Imaging results:   Ct Head (brain) Wo Contrast  03/10/2015   CLINICAL DATA:  Acute altered mental status.  EXAM: CT HEAD WITHOUT CONTRAST  TECHNIQUE: Contiguous axial images were obtained from the base of the skull through the vertex without intravenous contrast.  COMPARISON:  04/02/2013  FINDINGS: No mass lesion. No midline shift. No acute hemorrhage or hematoma. No extra-axial fluid collections. No evidence of acute infarction. There is diffuse slight cerebral cortical and cerebellar atrophy. Chronic small vessel ischemic changes in the anterior limbs of both internal capsules, stable. No osseous abnormality.  IMPRESSION: No acute intracranial abnormality. Diffuse atrophy. Chronic small vessel ischemic changes in the anterior limbs of the internal capsules.  Critical Value/emergent results were called by telephone at the time of interpretation on 03/10/2015 at 12:28 pm to Dr. Verlon Setting, who verbally  acknowledged these results.   Electronically Signed   By: Lorriane Shire M.D.   On: 03/10/2015 12:28    My personal review of EKG: NSR with LAFB, no pacer spikes.   Assessment & Plan  Principal Problem:   Syncope and collapse Active Problems:   Hypercholesterolemia   CAD (coronary artery disease)   Acute on chronic systolic CHF (congestive heart failure)   Benign hypertension with CKD (chronic kidney disease) stage III   Diabetes mellitus type 2 with retinopathy   Benign positional vertigo   Pacemaker   Cellulitis of right lower extremity   Unresponsive episode   Syncope vs TIA Patient had a 30 minute episode of non responsiveness at PCP office but appeared to be awake. Evaluated by neuro who recommends TIA work up and follow up CT in am as patient can not have an MRI Per wife patient feels dizzy if CBG is below 100.  Will be lenient with glucose control. Check orthostatic BP daily.  Holding BP medications (imdur, amlodipine) give hydralazine PRN SBP > 180 2d echo / carotid dopplers ordered.  PT / OT evaluations ordered. Asa 81 mg.  Continue crestor. Have asked cards to interrogate pace maker.  Acute on Chronic CHF. Last echo:  09/2014 LVEF 55- 60.  Grade 1 diastolic dysfunction. Patient appears Euvolemic.  Pulse rate in the 60s will not tolerate a BB.   Diabetes Mellitus Continue Lantus BID and SSI.   Dementia  At baseline per wife.  Chronic Renal Insufficiency Baseline was 1.62 2 months ago. Is 1.89 today. Urine still pending at the time of admission  - please follow. AM bmet  Right lower extremity cellulitis (resolving) Will continue doxycycline as originally prescribed by PCP. Elevate right leg.  Trace erythema.   DVT Prophylaxis: Heparin  AM Labs Ordered, also please review Full Orders  Family Communication:   Wife at bedside  Code Status:  Full code. Confirmed with patient  Condition:  Guarded  Time spent in minutes :  [address]  PA-C on  03/10/2015 at 3:40 PM  Between 7am to 7pm - Pager - [phone]  After 7pm go to [email or web address] - password [email or web address]  And look for the night coverage person covering me after hours  [facility]

## 2015-03-10 NOTE — Progress Notes (Signed)
Pre visit review using our clinic review tool, if applicable. No additional management support is needed unless otherwise documented below in the visit note.

## 2015-03-10 NOTE — ED Notes (Signed)
Per EMS-pt comes in as a Code stroke.  Pt was at [facility] for cellulitis of the right leg when he lost consciousness on the table.  Per EMS pt was not responding to staff.  On arrival pt was responding, alert, with right sided facial droop.  Hx: DM, CAD, PPM, syncope.  BP-171/92 O2-98% 4L, CBG 85.

## 2015-03-10 NOTE — ED Notes (Signed)
MD and Stroke RN at bedside.

## 2015-03-10 NOTE — Code Documentation (Signed)
81 year old male present to [facility] as code stroke.  He was at the [facility] clinic for follow up from a cellulitis of right lower leg - while being examined by the provider he suddenly fell backwards on the exam table with a few minute period of unresponsiveness.  When he aroused he was slow to speak and had some right side facial droop.  His blood sugar was 85 - wife reports that when his blood sugar is less than 100 he usually feels bad and has had some syncope episodes with it.  He was given some apple juice but remained with decreased LOC.  EMS was called and code stroke was activated.  On arrival he opens eyes to voice -looks around - follows few simple commands but very slow to respond.  He is warm and dry - EMS reports right side facial droop - he was very diaphoretic at scene.  CT scan was done.  NIHSS 06 - missed both questions - some dementia wife states would not know the month at times - lethargic - loss of fluency of speech- slight dysarthria - tongue midline - right facial droop.  Repeat blood sugar 79 - treated with 1/2 amp D50W IV.  Within 10 minutes more alert - looking around room - engaging in activity - able to read the sentences on the [facility] test - looks better but continues with residual right facial droop.  After 10 more minutes droop becoming less evident.  No acute treatment per Dr. Aram Beecham.  Remains in tpa window until 1335 - handoff to [place] - q 15 minute checks/vitals signs then q 2 hours.  Wife updated - questions answered.

## 2015-03-10 NOTE — Assessment & Plan Note (Signed)
Slight improvement, especially with swelling.  Would have liked to start Levaquin daily for 10 days with follow up in one week, but patient was taken to the hospital due to lethargic behavior and unresponsiveness.  Also would have liked to get a right tib/fib xray to rule out any possible osteomyelitis. Also did not obtain due to trip to emergency department.

## 2015-03-10 NOTE — Progress Notes (Signed)
Subjective:    Patient ID: Frank Simpson, male    DOB: 1931-01-14, 81 y.o.   MRN: [id number]  HPI  Mr. Jain is an 81 year old male who presents today for follow up of right lower extremity cellulitis. He fell intoo a ladder at ground level and presented to clinic with erythema and abrasions to his right lower extremity one week ago. He was started on a course of Doxycycline and instructed to follow up today. His swelling has improved, denies pain, fevers, chills. Slight improvement in the erythema but no major progress. He also has what appears to be some dependent rubor/vascular changes to dorsal side of right foot.   Unresponsive: At approximately 10:35am during examination, the patient's trunk feel backwards on exam table. He became diaphoretic, clammy, pale, and unreponsive. His blood sugar was checked in the clinic and was 85. His blood sugar was 83 this morning upon waking so his wife did not give him his Humalog, but did administer his Lantus. He was provided apple juice in the clinic which he was able to drink without difficulty. His wife reports his sugars typically have been running in the 70's to low 80's every morning. He's taking Humalog sliding scale and also taking Lantus 25 units BID. Prior to this incident he was alert and oriented x3, sitting upright, and conversational during the examination. This is a complete change in baseline from when he initially checked into our clinic.  An EKG was preformed in the clinic which showed 1st degree AV block which is similar to his prior ECG in [place].No obvious ST changes.  His HR was in the 60's and pulse oximetry was in the low 90's. He was immediately placed on 2 liters of oxygen per nasal cannula.  Throughout his time in our clinic he continued to remain lethargic and mostly unresponsive despite the apple juice and frequent attempts for arousal. He could not squeeze my hands. PERRL. EMS was called due to no improvement in condition. He  will be taken to [facility]. Once paramedics arrived his wife reported that he fell 2 days ago and that he's been complaining of a dull occipital headache.    Review of Systems  Constitutional: Negative for fever and chills.  Respiratory: Negative for shortness of breath.   Cardiovascular: Negative for chest pain.  Gastrointestinal: Negative for nausea and vomiting.  Genitourinary:       Does get up at night to use the bathroom. He is on Flomax. Otherwise no changes.  Musculoskeletal: Negative for myalgias.  Skin: Positive for color change and wound.  Neurological: Positive for headaches. Negative for dizziness and weakness.  Hematological: Negative for adenopathy.  Psychiatric/Behavioral: Negative for confusion.       None prior to today.       Past Medical History  Diagnosis Date  . Hypertension   . Diabetes mellitus type 2 with retinopathy 1994    dm edu 2012  . Hyperlipidemia   . Osteoarthritis   . CRI (chronic renal insufficiency)     baseline Cr seems to be 1.7-1.8  . Urinary incontinence     s/p PTNS didn't help  . Glaucoma     and cataracts  . History of melanoma   . CAD (coronary artery disease)     07/2012 acute STEMI, mid LAD PCI - DES; cath 09/2012 patent LAD stent, non-hemodynamically significant Left Main/LAD disease, EF 55%  . Thrombocytopenia   . BCC (basal cell carcinoma of skin) 2015  L neck (Dr. Sherrye Payor), L forearm [facility])  . Complication of anesthesia     confused after cath 10/15/2012  . CVA (cerebral infarction) 09/2012    remote anterior limb of left internal capsule  . Post herpetic neuralgia   . Shingles in March 2014    right chest, across the back  . Syncope 04/02/2013  . Blurred vision   . Dysplastic nevus 2015    L upper back, Lat margin involved ([facility])  . CHF (congestive heart failure)   . Ischemic heart disease   . Benign positional vertigo     History   Social History  . Marital Status: Married    Spouse  Name: Pamala Hurry  . Number of Children: 1  . Years of Education: 11   Occupational History  . retired    Social History Main Topics  . Smoking status: Never Smoker   . Smokeless tobacco: Never Used  . Alcohol Use: No  . Drug Use: No  . Sexual Activity: Not Currently   Other Topics Concern  . Not on file   Social History Narrative   ** Merged History Encounter **       Caffeine: 2-3 caffeinated drinks/day   Lives with wife Pamala Hurry , no pets   Occupation: retired, used to work cigarette factory   Edu: 11th grade   Activity: works around house   Diet: healthy overall.  Good fruits and vegetables, good amt water    Past Surgical History  Procedure Laterality Date  . Replacement total knee  04/2010    RIGHT KNEE  . Tonsillectomy    . Cardiovascular stress test  04/27/2010    EF 75%, nuclear stress test with normal perfusion, no ischemia  . Foot surgery      metal pin in place  . Right shoulder    . Finger surgery      amputated finger  . Lexiscan myoview  10/2011    negative for ischemia  . Cataract extraction  12/12, 1/13    bilateral  . Coronary stent placement  07/2012    DES to mid LAD for STEMI  . Cardiac catheterization  10/15/2012  . Carotid US  03/2013    WNL  . US echocardiography  03/2013    inf/septal hypokinesis, mild LVH, EF 45%, LA mildly dilated  . Penile prosthesis implant    . Left heart catheterization with coronary angiogram N/A 08/06/2012    Procedure: LEFT HEART CATHETERIZATION WITH CORONARY ANGIOGRAM;  Surgeon: Burnell Blanks, MD;  Location[facility] CATH LAB;  Service: Cardiovascular;  Laterality: N/A;  . Left heart catheterization with coronary angiogram N/A 10/15/2012    Procedure: LEFT HEART CATHETERIZATION WITH CORONARY ANGIOGRAM;  Surgeon: Peter M Martinique, MD;  Location: [facility] CATH LAB;  Service: Cardiovascular;  Laterality: N/A;  . Permanent pacemaker insertion N/A 10/06/2014    Procedure: PERMANENT PACEMAKER INSERTION;  Surgeon: Evans Lance, MD;   Location: [facility] CATH LAB;  Service: Cardiovascular;  Laterality: N/A;    Family History  Problem Relation Age of Onset  . Stroke Mother     hemorrhage  . Diabetes Mother   . Cancer Father     lung  . Diabetes Brother     Allergies  Allergen Reactions  . Januvia [Sitagliptin Phosphate] Other (See Comments)    Possibly affected kidneys?  Celesta Gentile [Sitagliptin]     Possible affected kidneys?  . Metformin And Related     Affected kidneys  . Oxycodone  Severe confusion  . Penicillins Other (See Comments)    Reaction long time ago - doesn't remember    Current Outpatient Prescriptions on File Prior to Visit  Medication Sig Dispense Refill  . acetaminophen (TYLENOL) 500 MG tablet Take 500 mg by mouth every 6 (six) hours as needed (pain).     [name] Kitchen amLODipine (NORVASC) 5 MG tablet Take 1 tablet (5 mg total) by mouth daily. 90 tablet 2  . aspirin 81 MG chewable tablet Chew 1 tablet (81 mg total) by mouth daily.    . cyanocobalamin (,VITAMIN B-12,) 1000 MCG/ML injection Inject 1 mL (1,000 mcg total) into the muscle every 30 (thirty) days. 1 mL   . doxycycline (VIBRA-TABS) 100 MG tablet Take 1 tablet (100 mg total) by mouth 2 (two) times daily. 20 tablet 0  . glucose blood (ONE TOUCH ULTRA TEST) test strip Check four times daily,Dx E11.319 120 each 5  . insulin glargine (LANTUS) 100 UNIT/ML injection Inject 22 units into the skin twice a day 40 mL 1  . insulin lispro (HUMALOG) 100 UNIT/ML injection Sliding scale as directed 5-15 units three times daily before meals. (Patient taking differently: Sliding scale as directed 5-15 units three times daily before meals.Sliding scale: 150-200 2 units; 201-250 4 units; 251-300 6 units; 301-350 8 units; >350 10 units; >450 15 units) 10 mL 3  . Insulin Pen Needle 31G X 8 MM MISC Use as directed with Lantus solostar. Dx 250.62. 100 each 5  . Insulin Syringe-Needle U-100 (BD INSULIN SYRINGE ULTRAFINE) 31G X 5/16" 0.5 ML MISC Inject 4x a day 400 each 3  .  isosorbide mononitrate (IMDUR) 30 MG 24 hr tablet Take 1 tablet (30 mg total) by mouth daily. 90 tablet 3  . meclizine (ANTIVERT) 25 MG tablet Take 25 mg by mouth every 6 (six) hours as needed for dizziness.    . nitroGLYCERIN (NITROSTAT) 0.4 MG SL tablet Place 1 tablet (0.4 mg total) under the tongue every 5 (five) minutes as needed for chest pain. Up to three doses,if pain continues call 911 25 tablet 3  . rosuvastatin (CRESTOR) 40 MG tablet Take 1 tablet (40 mg total) by mouth daily. 90 tablet 3  . sertraline (ZOLOFT) 25 MG tablet Take 1 tablet (25 mg total) by mouth daily. 90 tablet 3  . tamsulosin (FLOMAX) 0.4 MG CAPS capsule Take 1 capsule (0.4 mg total) by mouth daily. 90 capsule 3   No current facility-administered medications on file prior to visit.    BP 136/72 mmHg  Pulse 82  Temp(Src) 98.1 F (36.7 C) (Oral)  Ht 6' (1.829 m)  Wt 211 lb 12.8 oz (96.072 kg)  BMI 28.72 kg/m2  SpO2 96%    Objective:   Physical Exam  Constitutional: He appears well-developed.  Eyes: Pupils are equal, round, and reactive to light.  Neck: Neck supple.  Cardiovascular: Normal rate.   Pulmonary/Chest: Effort normal and breath sounds normal. He has no wheezes. He has no rales.  Lymphadenopathy:    He has no cervical adenopathy.  Neurological:  Prior to 10:35am he was alert and oriented x 3, sitting upright and following commands. After 10:35am he became lethargic, would not respond to commands. PERRL throughout his time in the clinic. Would not grip my hands.   Skin: Skin is warm and dry. There is erythema.     Cellulitis remains present to right lateral lower extremity and foot. Skin warm, dry and intact. 2+ DP and PT pulses bilaterally. Infection has slight improvement  from one week ago, but not much.  10:35am: Face and neck became pale and diaphoretic.   Psychiatric:  Prior to 10:35am mood and affect appropriate to circumstances.  After 10:35am: Became unresponsive, then lethargic, and  unable to follow commands.          Assessment & Plan:  >45 minutes spent face to face with patient, >50% spent counseling or coordinating care. Patient was sent to [facility] via EMS due to unresponsiveness and no improvement in status. He was still unable to follow commands.

## 2015-03-10 NOTE — ED Notes (Signed)
CBG 79, MD at bedside and aware

## 2015-03-10 NOTE — Progress Notes (Signed)
Patient arrived at 4N01. Tele placed, neuro assessment unchanged from prior charting. VSS. Patient denies pain. Patient was oriented to room by RN, patient educated about fall risk prevention. Patient stated he would ask for staff assistance prior to ambulation. Cellulitis marked per order. Call bell within patient's reach, bed alarm on. Will continue to monitor closely.

## 2015-03-10 NOTE — ED Notes (Signed)
CBG 143

## 2015-03-10 NOTE — Consult Note (Signed)
Referring Physician: ED    Chief Complaint: code stroke, unresponsiveness and confusion  HPI:                                                                                                                                         Frank Simpson is an 81 y.o. male with multiple medical problems including HTN, DM, hyperlipidemia, CAD, MI, chronic congestive heart failure, remote left internal capsule infarct, dementia, and syncope, brought to [facility] ED via EMS due to acute onset of the above stated symptoms. Wife is at the bedside. As per EMS and wife, patient went to see his PCP with concerns about possible right leg cellulitis when he lost consciousness. Per EMS pt was not responding to staff and was very sweaty. On arrival pt was responding, alert, with right sided facial droop. Reportedly, his blood sugar was 85 and he was given orange juice. Wife indicated that he had passes out in the past and he can get more confused than usual when his blood sugar gets into the low 80s. Patient denies HA, vertigo, double vision, focal weakness or numbness or vision disturbances. NIHSS 6. CT brain was personally reviewed and showed no acute abnormality.  Date last known well: 03/10/15 Time last known well: 10:35 am tPA Given: no, mild deficits that are improving, patient with dementia and concerned this episode could be related to low blood sugar. Further, some deficits probably related to underlying dementia and remote stroke left brain. NIHSS: 6  Past Medical History  Diagnosis Date  . Hypertension   . Diabetes mellitus type 2 with retinopathy 1994    dm edu 2012  . Hyperlipidemia   . Osteoarthritis   . CRI (chronic renal insufficiency)     baseline Cr seems to be 1.7-1.8  . Urinary incontinence     s/p PTNS didn't help  . Glaucoma     and cataracts  . History of melanoma   . CAD (coronary artery disease)     07/2012 acute STEMI, mid LAD PCI - DES; cath 09/2012 patent LAD stent, non-hemodynamically  significant Left Main/LAD disease, EF 55%  . Thrombocytopenia   . BCC (basal cell carcinoma of skin) 2015    L neck (Dr. Sherrye Payor), L forearm [facility])  . Complication of anesthesia     confused after cath 10/15/2012  . CVA (cerebral infarction) 09/2012    remote anterior limb of left internal capsule  . Post herpetic neuralgia   . Shingles in March 2014    right chest, across the back  . Syncope 04/02/2013  . Blurred vision   . Dysplastic nevus 2015    L upper back, Lat margin involved ([facility])  . CHF (congestive heart failure)   . Ischemic heart disease   . Benign positional vertigo     Past Surgical History  Procedure Laterality Date  . Replacement total knee  04/2010  RIGHT KNEE  . Tonsillectomy    . Cardiovascular stress test  04/27/2010    EF 75%, nuclear stress test with normal perfusion, no ischemia  . Foot surgery      metal pin in place  . Right shoulder    . Finger surgery      amputated finger  . Lexiscan myoview  10/2011    negative for ischemia  . Cataract extraction  12/12, 1/13    bilateral  . Coronary stent placement  07/2012    DES to mid LAD for STEMI  . Cardiac catheterization  10/15/2012  . Carotid US  03/2013    WNL  . US echocardiography  03/2013    inf/septal hypokinesis, mild LVH, EF 45%, LA mildly dilated  . Penile prosthesis implant    . Left heart catheterization with coronary angiogram N/A 08/06/2012    Procedure: LEFT HEART CATHETERIZATION WITH CORONARY ANGIOGRAM;  Surgeon: Burnell Blanks, MD;  Location: [facility] CATH LAB;  Service: Cardiovascular;  Laterality: N/A;  . Left heart catheterization with coronary angiogram N/A 10/15/2012    Procedure: LEFT HEART CATHETERIZATION WITH CORONARY ANGIOGRAM;  Surgeon: Peter M Martinique, MD;  Location: [facility] CATH LAB;  Service: Cardiovascular;  Laterality: N/A;  . Permanent pacemaker insertion N/A 10/06/2014    Procedure: PERMANENT PACEMAKER INSERTION;  Surgeon: Evans Lance, MD;  Location: [facility] CATH LAB;   Service: Cardiovascular;  Laterality: N/A;    Family History  Problem Relation Age of Onset  . Stroke Mother     hemorrhage  . Diabetes Mother   . Cancer Father     lung  . Diabetes Brother    Social History:  reports that he has never smoked. He has never used smokeless tobacco. He reports that he does not drink alcohol or use illicit drugs.  Allergies:  Allergies  Allergen Reactions  . Januvia [Sitagliptin Phosphate] Other (See Comments)    Possibly affected kidneys?  Celesta Gentile [Sitagliptin]     Possible affected kidneys?  . Metformin And Related     Affected kidneys  . Oxycodone     Severe confusion  . Penicillins Other (See Comments)    Reaction long time ago - doesn't remember    Medications:                                                                                                                           I have reviewed the patient's current medications.  ROS: unable to obtain due to mental status  History obtained from chart review and wife  Physical exam: pleasant male in no apparent distress. Blood pressure 150/70, pulse 60, temperature 97.9 F (36.6 C), temperature source Oral, resp. rate 15, SpO2 97 %. Head: normocephalic. Neck: supple, no bruits, no JVD. Cardiac: no murmurs. Lungs: clear. Abdomen: soft, no tender, no mass. Extremities: mild ankle edema right leg. Skin: erythema right lower leg Neurologic Examination:                                                                                                      General: Mental Status: Alert, disoriented to year-month-day and place. Dysarthria.  Able to follow 3 step commands without difficulty. Cranial Nerves: II: Discs flat bilaterally; Visual fields grossly normal, pupils equal, round, reactive to light and accommodation III,IV, VI: ptosis not present, extra-ocular motions intact bilaterally V,VII: smile  asymmetric due to right face weakness, facial light touch sensation normal bilaterally VIII: hearing normal bilaterally IX,X: uvula rises symmetrically XI: bilateral shoulder shrug XII: midline tongue extension without atrophy or fasciculations Motor: Right : Upper extremity   5/5    Left:     Upper extremity   5/5  Lower extremity   5/5     Lower extremity   5/5 Tone and bulk:normal tone throughout; no atrophy noted Sensory: Pinprick and light touch intact throughout, bilaterally Deep Tendon Reflexes:  1+ all over Plantars: Right: downgoing   Left: downgoing Cerebellar: normal finger-to-nose,  normal heel-to-shin test Gait: Unable to test due to safety reasons.    Results for orders placed or performed during the hospital encounter of 03/10/15 (from the past 48 hour(s))  Protime-INR     Status: None   Collection Time: 03/10/15 12:09 PM  Result Value Ref Range   Prothrombin Time 14.4 11.6 - 15.2 seconds   INR 1.10 0.00 - 1.49  APTT     Status: None   Collection Time: 03/10/15 12:09 PM  Result Value Ref Range   aPTT 25 24 - 37 seconds  CBC     Status: None   Collection Time: 03/10/15 12:09 PM  Result Value Ref Range   WBC 8.4 4.0 - 10.5 K/uL   RBC 5.05 4.22 - 5.81 MIL/uL   Hemoglobin 15.8 13.0 - 17.0 g/dL   HCT 44.6 39.0 - 52.0 %   MCV 88.3 78.0 - 100.0 fL   MCH 31.3 26.0 - 34.0 pg   MCHC 35.4 30.0 - 36.0 g/dL   RDW 13.8 11.5 - 15.5 %   Platelets 150 150 - 400 K/uL  Differential     Status: None   Collection Time: 03/10/15 12:09 PM  Result Value Ref Range   Neutrophils Relative % 63 43 - 77 %   Neutro Abs 5.4 1.7 - 7.7 K/uL   Lymphocytes Relative 28 12 - 46 %   Lymphs Abs 2.3 0.7 - 4.0 K/uL   Monocytes Relative 8 3 - 12 %   Monocytes Absolute 0.6 0.1 - 1.0 K/uL   Eosinophils Relative 1 0 - 5 %   Eosinophils Absolute 0.1 0.0 - 0.7 K/uL  Basophils Relative 0 0 - 1 %   Basophils Absolute 0.0 0.0 - 0.1 K/uL  I-stat troponin, ED (not at [facility])     Status: None    Collection Time: 03/10/15 12:15 PM  Result Value Ref Range   Troponin i, poc 0.01 0.00 - 0.08 ng/mL   Comment 3            Comment: Due to the release kinetics of cTnI, a negative result within the first hours of the onset of symptoms does not rule out myocardial infarction with certainty. If myocardial infarction is still suspected, repeat the test at appropriate intervals.    Ct Head (brain) Wo Contrast  03/10/2015   CLINICAL DATA:  Acute altered mental status.  EXAM: CT HEAD WITHOUT CONTRAST  TECHNIQUE: Contiguous axial images were obtained from the base of the skull through the vertex without intravenous contrast.  COMPARISON:  04/02/2013  FINDINGS: No mass lesion. No midline shift. No acute hemorrhage or hematoma. No extra-axial fluid collections. No evidence of acute infarction. There is diffuse slight cerebral cortical and cerebellar atrophy. Chronic small vessel ischemic changes in the anterior limbs of both internal capsules, stable. No osseous abnormality.  IMPRESSION: No acute intracranial abnormality. Diffuse atrophy. Chronic small vessel ischemic changes in the anterior limbs of the internal capsules.  Critical Value/emergent results were called by telephone at the time of interpretation on 03/10/2015 at 12:28 pm to Dr. Verlon Setting, who verbally acknowledged these results.   Electronically Signed   By: Lorriane Shire M.D.   On: 03/10/2015 12:28    Assessment: 81 y.o. male brought in as a code stroke due to acute unresponsiveness and confusion. CT brain without acute abnormality. NIHSS 6 but improving and I am afraid that some  of his deficits could be old and dementia related. However, patient with several risk factors for stroke and thus a small left brain infarct or TIA should be considered. Syncope and low blood sugar in a patient with poor brain reserve also likely. Patient can not have MRI due to pacemaker. Seizure less likely Will suggest admission to medicine and stroke work up.  ASA after passing swallowing evaluation. Stroke team will follow up tomorrow.   Stroke Risk Factors - age, HTN, DM, hyperlipidemia, CAD, chronic congestive heart failure, prior ischemic stroke  Plan: 1. HgbA1c, fasting lipid panel 2. CT brain in am 3. Echocardiogram 4. Carotid dopplers 5. Prophylactic therapy-aspirin 6. Risk factor modification 7. Telemetry monitoring 8. Frequent neuro checks 9. PT/OT SLP  Dorian Pod, MD [facility] [phone]  03/10/2015, 12:47 PM

## 2015-03-11 ENCOUNTER — Encounter (HOSPITAL_COMMUNITY): Payer: Self-pay

## 2015-03-11 ENCOUNTER — Inpatient Hospital Stay (HOSPITAL_COMMUNITY): Payer: Medicare Other

## 2015-03-11 DIAGNOSIS — E162 Hypoglycemia, unspecified: Secondary | ICD-10-CM

## 2015-03-11 DIAGNOSIS — E11319 Type 2 diabetes mellitus with unspecified diabetic retinopathy without macular edema: Secondary | ICD-10-CM

## 2015-03-11 DIAGNOSIS — I2510 Atherosclerotic heart disease of native coronary artery without angina pectoris: Secondary | ICD-10-CM

## 2015-03-11 DIAGNOSIS — E780 Pure hypercholesterolemia: Secondary | ICD-10-CM

## 2015-03-11 DIAGNOSIS — Z950 Presence of cardiac pacemaker: Secondary | ICD-10-CM

## 2015-03-11 DIAGNOSIS — R404 Transient alteration of awareness: Secondary | ICD-10-CM

## 2015-03-11 DIAGNOSIS — I6350 Cerebral infarction due to unspecified occlusion or stenosis of unspecified cerebral artery: Secondary | ICD-10-CM

## 2015-03-11 LAB — GLUCOSE, CAPILLARY
GLUCOSE-CAPILLARY: 106 mg/dL — AB (ref 70–99)
GLUCOSE-CAPILLARY: 223 mg/dL — AB (ref 70–99)
Glucose-Capillary: 101 mg/dL — ABNORMAL HIGH (ref 70–99)
Glucose-Capillary: 209 mg/dL — ABNORMAL HIGH (ref 70–99)
Glucose-Capillary: 84 mg/dL (ref 70–99)
Glucose-Capillary: 99 mg/dL (ref 70–99)
Glucose-Capillary: 99 mg/dL (ref 70–99)

## 2015-03-11 LAB — LIPID PANEL
CHOL/HDL RATIO: 2.9 ratio
Cholesterol: 110 mg/dL (ref 0–200)
HDL: 38 mg/dL — AB (ref >39)
LDL CALC: 52 mg/dL (ref 0–99)
Triglycerides: 100 mg/dL (ref <150)
VLDL: 20 mg/dL (ref 0–40)

## 2015-03-11 LAB — COMPREHENSIVE METABOLIC PANEL
ALBUMIN: 2.9 g/dL — AB (ref 3.5–5.2)
ALT: 14 U/L (ref 0–53)
ANION GAP: 15 (ref 5–15)
AST: 21 U/L (ref 0–37)
Alkaline Phosphatase: 54 U/L (ref 39–117)
BUN: 17 mg/dL (ref 6–23)
CALCIUM: 8.6 mg/dL (ref 8.4–10.5)
CO2: 20 mmol/L (ref 19–32)
Chloride: 105 mmol/L (ref 96–112)
Creatinine, Ser: 1.45 mg/dL — ABNORMAL HIGH (ref 0.50–1.35)
GFR calc non Af Amer: 43 mL/min — ABNORMAL LOW (ref >90)
GFR, EST AFRICAN AMERICAN: 50 mL/min — AB (ref >90)
Glucose, Bld: 82 mg/dL (ref 70–99)
Potassium: 3.8 mmol/L (ref 3.5–5.1)
Sodium: 140 mmol/L (ref 135–145)
TOTAL PROTEIN: 6.3 g/dL (ref 6.0–8.3)
Total Bilirubin: 0.7 mg/dL (ref 0.3–1.2)

## 2015-03-11 LAB — HEMOGLOBIN A1C
Hgb A1c MFr Bld: 8.8 % — ABNORMAL HIGH (ref 4.8–5.6)
MEAN PLASMA GLUCOSE: 206 mg/dL

## 2015-03-11 MED ORDER — AMLODIPINE BESYLATE 5 MG PO TABS
5.0000 mg | ORAL_TABLET | Freq: Every day | ORAL | Status: DC
  Administered 2015-03-11 – 2015-03-12 (×2): 5 mg via ORAL
  Filled 2015-03-11 (×2): qty 1

## 2015-03-11 MED ORDER — SODIUM CHLORIDE 0.9 % IV SOLN
INTRAVENOUS | Status: DC
  Administered 2015-03-11: 08:00:00 via INTRAVENOUS

## 2015-03-11 NOTE — Progress Notes (Signed)
  Echocardiogram 2D Echocardiogram has been performed.  Frank Simpson 03/11/2015, 4:20 PM

## 2015-03-11 NOTE — Progress Notes (Signed)
VASCULAR LAB PRELIMINARY  PRELIMINARY  PRELIMINARY  PRELIMINARY  Carotid Duplex completed.    Preliminary report:  Carotid velocities and ratios within the 1-39% range of stenosis bilaterally. Vertebral arteries are patent and antegrade bilaterally.  August Albino, RVT 03/11/2015, 6:36 PM

## 2015-03-11 NOTE — Evaluation (Signed)
Physical Therapy Evaluation Patient Details Name: Frank Simpson MRN: [id number] DOB: 01/31/1931 Today's Date: 03/11/2015   History of Present Illness  Pt adm after syncopal episode at MD office. CT negative. PMH - brain injury due to fall 11/15, frequent falls, pacer, CAD, DM, MI.  Clinical Impression  Pt presents to PT with history of frequent falls and syncope at home. Pt demonstrating good mobility, good strength, and good balance. Wife reports pt c/o dizziness when he has fallen. Seeing how well pt performing today I feel falls may be more related to physiological issues than balance issues. Do not feel any type of rehab stay will change pt's fall risk. Will follow acutely to incr mobility. Instructed pt and wife that pt should try to find a place to sit as soon as he feels any dizziness.    Follow Up Recommendations No PT follow up    Equipment Recommendations  None recommended by PT    Recommendations for Other Services       Precautions / Restrictions Precautions Precautions: Fall Restrictions Weight Bearing Restrictions: No      Mobility  Bed Mobility Overal bed mobility: Modified Independent                Transfers Overall transfer level: Needs assistance Equipment used: Rolling walker (2 wheeled) Transfers: Sit to/from Stand Sit to Stand: Supervision         General transfer comment: For safety due to history of syncope/falls  Ambulation/Gait Ambulation/Gait assistance: Supervision Ambulation Distance (Feet): 600 Feet Assistive device: Rolling walker (2 wheeled);None Gait Pattern/deviations: Step-through pattern;Decreased stride length     General Gait Details: Pt amb without unsteadiness or loss of balance with or without the walker.  Stairs            Wheelchair Mobility    Modified Rankin (Stroke Patients Only)       Balance Overall balance assessment: Needs assistance Sitting-balance support: No upper extremity supported;Feet  supported Sitting balance-Leahy Scale: Normal     Standing balance support: No upper extremity supported Standing balance-Leahy Scale: Good                               Pertinent Vitals/Pain Pain Assessment: No/denies pain    Home Living Family/patient expects to be discharged to:: Private residence Living Arrangements: Spouse/significant other Available Help at Discharge: Family;Available 24 hours/day Type of Home: House Home Access: Stairs to enter Entrance Stairs-Rails: Chemical engineer of Steps: 3  Home Layout: One level Home Equipment: Walker - 2 wheels;Cane - single point      Prior Function Level of Independence: Independent with assistive device(s)         Comments: Uses rolling walker most of time. At times amb with assistive device. Frequent falls at home. Per wife pt reports dizziness with these falls.     Hand Dominance   Dominant Hand: Right    Extremity/Trunk Assessment   Upper Extremity Assessment: Overall WFL for tasks assessed           Lower Extremity Assessment: Overall WFL for tasks assessed         Communication   Communication: No difficulties  Cognition Arousal/Alertness: Awake/alert Behavior During Therapy: WFL for tasks assessed/performed Overall Cognitive Status: History of cognitive impairments - at baseline       Memory: Decreased short-term memory              General Comments  Exercises        Assessment/Plan    PT Assessment Patient needs continued PT services  PT Diagnosis Difficulty walking   PT Problem List Decreased mobility  PT Treatment Interventions DME instruction;Gait training;Functional mobility training;Therapeutic activities;Therapeutic exercise;Balance training;Patient/family education   PT Goals (Current goals can be found in the Care Plan section) Acute Rehab PT Goals Patient Stated Goal: return home PT Goal Formulation: With patient/family Time For Goal  Achievement: 03/18/15 Potential to Achieve Goals: Good    Frequency Min 3X/week   Barriers to discharge        Co-evaluation               End of Session Equipment Utilized During Treatment: Gait belt Activity Tolerance: Patient tolerated treatment well Patient left: in chair;with call bell/phone within reach;with chair alarm set Nurse Communication: Mobility status         Time: 9326-7124 PT Time Calculation (min) (ACUTE ONLY): 32 min   Charges:   PT Evaluation $Initial PT Evaluation Tier I: 1 Procedure PT Treatments $Self Care/Home Management: 8-22   PT G Codes:        Desare Duddy Mar 30, 2015, 10:49 AM  Suanne Marker PT [phone]

## 2015-03-11 NOTE — Progress Notes (Signed)
Patient Demographics  Frank Simpson, is a 81 y.o. male, DOB - 10/25/1931, LPF:[id number]  Admit date - 03/10/2015   Admitting Physician Costin Karlyne Greenspan, MD  Outpatient Primary MD for the patient is [name], MD  LOS - 1   Chief Complaint  Patient presents with  . Code Stroke        Subjective:   Frank Simpson today has, No headache, No chest pain, No abdominal pain - No Nausea, No new weakness tingling or numbness, No Cough - SOB.   Assessment & Plan    1. Syncope - ? if this was combination of dehydration and mild relative hypoglycemia. Improved after hydration, CT head 2 unremarkable cannot do MRI as he has a pacemaker. Was mildly orthostatic upon arrival. Complete TIA workup with echogram and carotid duplex. LDL at goal. A1c 8.8 (permissive hyperglycemia as he is overtly sensitive to sugars around 80 and below). PT eval and monitor. Continue aspirin for now.   2. Hx of bradycardia status post [employer]. Jude pacemaker placement. Stable. We will have pacemaker interrogated. Cardiology requested.   3. DM type II. Apparently he is very sensitive to sugars below 80. Upon arrival sugar was 80. A1c pending. Monitor CBGs. Keep him slightly above 100 if possible.  CBG (last 3)   Recent Labs  03/11/15 0015 03/11/15 0501 03/11/15 0740  GLUCAP 99 84 99    Lab Results  Component Value Date   HGBA1C 8.8* 03/10/2015     4. Dsylipidemia on statin LDL below 70.    5. Acute renal failure on CK D stage III. Baseline creatinine around 1.4. At baseline after hydration.    6. Eason lower extremity cellulitis. Almost resolved on doxycycline.    7. Dementia. Supportive care.    Code Status: Full  Family Communication: none  Disposition Plan: TBD   Procedures   CT Head x 2    TTE, Carotids   Consults Neuro   Medications  Scheduled Meds: . aspirin EC  81 mg Oral Daily  . [START ON 04/09/2015] cyanocobalamin  1,000 mcg Intramuscular Q30 days  . doxycycline (VIBRAMYCIN) IV  100 mg Intravenous Q12H  . heparin  5,000 Units Subcutaneous 3 times per day  . insulin aspart  0-9 Units Subcutaneous 6 times per day  . insulin glargine  20 Units Subcutaneous BID  . rosuvastatin  40 mg Oral Daily  . sertraline  25 mg Oral Daily  . tamsulosin  0.4 mg Oral Daily   Continuous Infusions: . sodium chloride 50 mL/hr at 03/11/15 0744   PRN Meds:.hydrALAZINE, meclizine, nitroGLYCERIN, senna-docusate  DVT Prophylaxis    Heparin    Lab Results  Component Value Date   PLT 150 03/10/2015    Antibiotics     Anti-infectives    Start     Dose/Rate Route Frequency Ordered Stop   03/10/15 2300  doxycycline (VIBRAMYCIN) 100 mg in dextrose 5 % 250 mL IVPB     100 mg 125 mL/hr over 120 Minutes Intravenous Every 12 hours 03/10/15 2124     03/10/15 2200  doxycycline (VIBRA-TABS) tablet 100 mg  Status:  Discontinued     100 mg Oral 2 times daily 03/10/15 1538 03/10/15 2123  Objective:   Filed Vitals:   03/11/15 0000 03/11/15 0200 03/11/15 0400 03/11/15 0902  BP: 143/67 166/79 159/70 163/84  Pulse: 60 59 58 59  Temp: 98.2 F (36.8 C) 98.2 F (36.8 C) 98.2 F (36.8 C) 97.9 F (36.6 C)  TempSrc: Oral Oral Axillary Oral  Resp: _0 Height:      Weight:      SpO2: 94% 96% 96% 97%    Wt Readings from Last 3 Encounters:  03/10/15 92.999 kg (205 lb 0.4 oz)  03/10/15 96.072 kg (211 lb 12.8 oz)  03/03/15 96.671 kg (213 lb 1.9 oz)     Intake/Output Summary (Last 24 hours) at 03/11/15 1103 Last data filed at 03/11/15 0017  Gross per 24 hour  Intake      0 ml  Output    280 ml  Net   -280 ml     Physical Exam  Awake Alert, Oriented X 3, No new F.N deficits, Normal affect .AT,PERRAL Supple Neck,No JVD, No cervical lymphadenopathy  appriciated.  Symmetrical Chest wall movement, Good air movement bilaterally, CTAB RRR,No Gallops,Rubs or new Murmurs, No Parasternal Heave +ve B.Sounds, Abd Soft, No tenderness, No organomegaly appriciated, No rebound - guarding or rigidity. No Cyanosis, Clubbing or edema, No new Rash or bruise      Data Review   Micro Results No results found for this or any previous visit (from the past 240 hour(s)).  Radiology Reports Dg Chest 2 View  03/10/2015   CLINICAL DATA:  TIA.  EXAM: CHEST  2 VIEW  COMPARISON:  04/02/2013  FINDINGS: Placement of dual lead left-sided pacemaker, tips projecting over the right atrium and ventricle. The cardiomediastinal contours are unchanged with mild tortuosity of the thoracic aorta. A coronary stent is seen. No pulmonary edema. No consolidation, pleural effusion, or pneumothorax. No acute osseous abnormalities are seen.  IMPRESSION: No acute pulmonary process.   Electronically Signed   By: Jeb Levering M.D.   On: 03/10/2015 21:01   Ct Head Wo Contrast  03/11/2015   CLINICAL DATA:  Stroke with cerebral ischemia. Patient reports syncope.  EXAM: CT HEAD WITHOUT CONTRAST  TECHNIQUE: Contiguous axial images were obtained from the base of the skull through the vertex without intravenous contrast.  COMPARISON:  12 hr prior.  FINDINGS: No intracranial hemorrhage, mass effect, or midline shift. No evolving ischemia. No cerebral edema, greater weight differentiation is preserved. Chronic small vessel ischemic change and atrophy, stable from prior. The basilar cisterns are patent. No intracranial fluid collection. Calvarium is intact. Included paranasal sinuses and mastoid air cells are well aerated.  IMPRESSION: No CT findings of acute ischemia or cerebral edema. No significant change from prior.   Electronically Signed   By: Jeb Levering M.D.   On: 03/11/2015 01:33   Ct Head (brain) Wo Contrast  03/10/2015   CLINICAL DATA:  Acute altered mental status.  EXAM: CT HEAD  WITHOUT CONTRAST  TECHNIQUE: Contiguous axial images were obtained from the base of the skull through the vertex without intravenous contrast.  COMPARISON:  04/02/2013  FINDINGS: No mass lesion. No midline shift. No acute hemorrhage or hematoma. No extra-axial fluid collections. No evidence of acute infarction. There is diffuse slight cerebral cortical and cerebellar atrophy. Chronic small vessel ischemic changes in the anterior limbs of both internal capsules, stable. No osseous abnormality.  IMPRESSION: No acute intracranial abnormality. Diffuse atrophy. Chronic small vessel ischemic changes in the anterior limbs of the internal capsules.  Critical Value/emergent results  were called by telephone at the time of interpretation on 03/10/2015 at 12:28 pm to Dr. Verlon Setting, who verbally acknowledged these results.   Electronically Signed   By: Lorriane Shire M.D.   On: 03/10/2015 12:28     CBC  Recent Labs Lab 03/10/15 1209  WBC 8.4  HGB 15.8  HCT 44.6  PLT 150  MCV 88.3  MCH 31.3  MCHC 35.4  RDW 13.8  LYMPHSABS 2.3  MONOABS 0.6  EOSABS 0.1  BASOSABS 0.0    Chemistries   Recent Labs Lab 03/10/15 1209 03/11/15 0508  NA 140 140  K 3.9 3.8  CL 105 105  CO2 26 20  GLUCOSE 88 82  BUN 23 17  CREATININE 1.89* 1.45*  CALCIUM 9.0 8.6  AST 24 21  ALT 17 14  ALKPHOS 62 54  BILITOT 0.6 0.7   ------------------------------------------------------------------------------------------------------------------ estimated creatinine clearance is 43.1 mL/min (by C-G formula based on Cr of 1.45). ------------------------------------------------------------------------------------------------------------------  Recent Labs  03/10/15 1209  HGBA1C 8.8*   ------------------------------------------------------------------------------------------------------------------  Recent Labs  03/11/15 0508  CHOL 110  HDL 38*  LDLCALC 52  TRIG 100  CHOLHDL 2.9    ------------------------------------------------------------------------------------------------------------------ No results for input(s): TSH, T4TOTAL, T3FREE, THYROIDAB in the last 72 hours.  Invalid input(s): FREET3 ------------------------------------------------------------------------------------------------------------------ No results for input(s): VITAMINB12, FOLATE, FERRITIN, TIBC, IRON, RETICCTPCT in the last 72 hours.  Coagulation profile  Recent Labs Lab 03/10/15 1209  INR 1.10    No results for input(s): DDIMER in the last 72 hours.  Cardiac Enzymes No results for input(s): CKMB, TROPONINI, MYOGLOBIN in the last 168 hours.  Invalid input(s): CK ------------------------------------------------------------------------------------------------------------------ Invalid input(s): POCBNP     Time Spent in minutes  35   Lelani Garnett K M.D on 03/11/2015 at 11:03 AM  Between 7am to 7pm - Pager - [phone]  After 7pm go to [email or web address] - password [facility]  [facility]   Office  [phone]

## 2015-03-11 NOTE — Evaluation (Signed)
Clinical/Bedside Swallow Evaluation Patient Details  Name: Frank Simpson MRN: [id number] Date of Birth: [birth year]  Today's Date: 03/11/2015 Time: SLP Start Time (ACUTE ONLY): 1055 SLP Stop Time (ACUTE ONLY): 1111 SLP Time Calculation (min) (ACUTE ONLY): 16 min  Past Medical History:  Past Medical History  Diagnosis Date  . Hypertension   . Diabetes mellitus type 2 with retinopathy 1994    dm edu 2012  . Hyperlipidemia   . Osteoarthritis   . CRI (chronic renal insufficiency)     baseline Cr seems to be 1.7-1.8  . Urinary incontinence     s/p PTNS didn't help  . Glaucoma     and cataracts  . History of melanoma   . CAD (coronary artery disease)     07/2012 acute STEMI, mid LAD PCI - DES; cath 09/2012 patent LAD stent, non-hemodynamically significant Left Main/LAD disease, EF 55%  . Thrombocytopenia   . BCC (basal cell carcinoma of skin) 2015    L neck (Dr. Sherrye Payor), L forearm [facility])  . Complication of anesthesia     confused after cath 10/15/2012  . CVA (cerebral infarction) 09/2012    remote anterior limb of left internal capsule  . Post herpetic neuralgia   . Shingles in March 2014    right chest, across the back  . Syncope 04/02/2013  . Blurred vision   . Dysplastic nevus 2015    L upper back, Lat margin involved ([place])  . CHF (congestive heart failure)   . Ischemic heart disease   . Benign positional vertigo    Past Surgical History:  Past Surgical History  Procedure Laterality Date  . Replacement total knee  04/2010    RIGHT KNEE  . Tonsillectomy    . Cardiovascular stress test  04/27/2010    EF 75%, nuclear stress test with normal perfusion, no ischemia  . Foot surgery      metal pin in place  . Right shoulder    . Finger surgery      amputated finger  . Lexiscan myoview  10/2011    negative for ischemia  . Cataract extraction  12/12, 1/13    bilateral  . Coronary stent placement  07/2012    DES to mid LAD for STEMI  . Cardiac catheterization   10/15/2012  . Carotid US  03/2013    WNL  . US echocardiography  03/2013    inf/septal hypokinesis, mild LVH, EF 45%, LA mildly dilated  . Penile prosthesis implant    . Left heart catheterization with coronary angiogram N/A 08/06/2012    Procedure: LEFT HEART CATHETERIZATION WITH CORONARY ANGIOGRAM;  Surgeon: Burnell Blanks, MD;  Location: [facility] CATH LAB;  Service: Cardiovascular;  Laterality: N/A;  . Left heart catheterization with coronary angiogram N/A 10/15/2012    Procedure: LEFT HEART CATHETERIZATION WITH CORONARY ANGIOGRAM;  Surgeon: Peter M Martinique, MD;  Location: [facility] CATH LAB;  Service: Cardiovascular;  Laterality: N/A;  . Permanent pacemaker insertion N/A 10/06/2014    Procedure: PERMANENT PACEMAKER INSERTION;  Surgeon: Evans Lance, MD;  Location: [facility] CATH LAB;  Service: Cardiovascular;  Laterality: N/A;   HPI:  Pt adm after syncopal episode at MD office. CT negative. PMH - brain injury due to fall 11/15, frequent falls, pacer, CAD, DM, MI.   Assessment / Plan / Recommendation Clinical Impression  Pt requires Min cues for appropriate pacing with PO intake, which wife reports is baseline. Delayed cough is noted x1 following solid trials, with no other overt signs of  aspiration noted. Given that repeat CT head does not show acute changes, CXR is negative, and wife reports pt is at baseline, will initiate regular textures and thin liquids. No acute SLP needs identified.    Aspiration Risk  Mild    Diet Recommendation Regular;Thin liquid   Liquid Administration via: Cup;Straw Medication Administration: Whole meds with liquid Supervision: Patient able to self feed;Intermittent supervision to cue for compensatory strategies Compensations: Slow rate;Small sips/bites Postural Changes and/or Swallow Maneuvers: Seated upright 90 degrees;Upright 30-60 min after meal    Other  Recommendations Oral Care Recommendations: Oral care BID   Follow Up Recommendations  None    Frequency  and Duration        Pertinent Vitals/Pain n/a    SLP Swallow Goals     Swallow Study Prior Functional Status  Type of Home: House Available Help at Discharge: Family;Available 24 hours/day    General HPI: Pt adm after syncopal episode at MD office. CT negative. PMH - brain injury due to fall 11/15, frequent falls, pacer, CAD, DM, MI. Type of Study: Bedside swallow evaluation Previous Swallow Assessment: pt previously seen for clinical swallow evaluation with recommendatiosn for regular diet, thin liquids Diet Prior to this Study: NPO Temperature Spikes Noted: No Respiratory Status: Room air History of Recent Intubation: No Behavior/Cognition: Alert;Cooperative;Pleasant mood;Requires cueing Oral Cavity - Dentition: Adequate natural dentition Self-Feeding Abilities: Able to feed self Patient Positioning: Upright in chair Baseline Vocal Quality: Clear    Oral/Motor/Sensory Function Overall Oral Motor/Sensory Function: Appears within functional limits for tasks assessed   Ice Chips Ice chips: Not tested   Thin Liquid Thin Liquid: Within functional limits Presentation: Self Fed;Straw    Nectar Thick Nectar Thick Liquid: Not tested   Honey Thick Honey Thick Liquid: Not tested   Puree Puree: Within functional limits Presentation: Self Fed;Spoon   Solid    Solid: Impaired Presentation: Self Fed Pharyngeal Phase Impairments: Cough - Delayed (x1)      Germain Osgood, M.A. CCC-SLP [phone]  Germain Osgood 03/11/2015,11:27 AM

## 2015-03-11 NOTE — Progress Notes (Signed)
STROKE TEAM PROGRESS NOTE   HISTORY Frank Simpson is an 81 y.o. male with multiple medical problems including HTN, DM, hyperlipidemia, CAD, MI, chronic congestive heart failure, remote left internal capsule infarct, dementia, and syncope, brought to [facility] ED via EMS due to acute onset of the above stated symptoms. Wife is at the bedside. As per EMS and wife, patient went to see his PCP with concerns about possible right leg cellulitis when he lost consciousness. Per EMS pt was not responding to staff and was very sweaty. On arrival pt was responding, alert, with right sided facial droop. Reportedly, his blood sugar was 85 and he was given orange juice. Wife indicated that he had passes out in the past and he can get more confused than usual when his blood sugar gets into the low 80s. Patient denies HA, vertigo, double vision, focal weakness or numbness or vision disturbances. NIHSS 6. CT brain was personally reviewed and showed no acute abnormality.  Date last known well: 03/10/15 Time last known well: 10:35 am tPA Given: no, mild deficits that are improving, patient with dementia and concerned this episode could be related to low blood sugar. Further, some deficits probably related to underlying dementia and remote stroke left brain. NIHSS: 6 He was admitted to the neuro 4N for further evaluation and treatment.   SUBJECTIVE (INTERVAL HISTORY) His wife is at the bedside.  Overall he feels his condition has resolved.  No issues overnight as per nursing staff. Wife stated that he passed out in doctor's office, sweaty, no shaking or jerking, no arm or leg weakness, no facial droop. ER concerning for right facial droop by looking at him, however, that is his baseline as per wife.  OBJECTIVE Temp:  [97.9 F (36.6 C)-98.2 F (36.8 C)] 98.2 F (36.8 C) (04/13 0400) Pulse Rate:  [58-82] 58 (04/13 0400) Cardiac Rhythm:  [-] Atrial paced (04/13 0830) Resp:  [15-18] 16 (04/13 0400) BP:  (136-166)/(66-86) 159/70 mmHg (04/13 0400) SpO2:  [94 %-98 %] 96 % (04/13 0400) Weight:  [92.999 kg (205 lb 0.4 oz)-96.072 kg (211 lb 12.8 oz)] 92.999 kg (205 lb 0.4 oz) (04/12 1252)   Recent Labs Lab 03/10/15 1845 03/10/15 2059 03/11/15 0015 03/11/15 0501 03/11/15 0740  GLUCAP 99 93 99 84 99    Recent Labs Lab 03/10/15 1209 03/11/15 0508  NA 140 140  K 3.9 3.8  CL 105 105  CO2 26 20  GLUCOSE 88 82  BUN 23 17  CREATININE 1.89* 1.45*  CALCIUM 9.0 8.6    Recent Labs Lab 03/10/15 1209 03/11/15 0508  AST 24 21  ALT 17 14  ALKPHOS 62 54  BILITOT 0.6 0.7  PROT 6.5 6.3  ALBUMIN 3.3* 2.9*    Recent Labs Lab 03/10/15 1209  WBC 8.4  NEUTROABS 5.4  HGB 15.8  HCT 44.6  MCV 88.3  PLT 150   No results for input(s): CKTOTAL, CKMB, CKMBINDEX, TROPONINI in the last 168 hours.  Recent Labs  03/10/15 1209  LABPROT 14.4  INR 1.10    Recent Labs  03/10/15 1733  COLORURINE YELLOW  LABSPEC 1.020  PHURINE 5.5  GLUCOSEU 100*  HGBUR NEGATIVE  BILIRUBINUR NEGATIVE  KETONESUR 15*  PROTEINUR 100*  UROBILINOGEN 0.2  NITRITE NEGATIVE  LEUKOCYTESUR NEGATIVE       Component Value Date/Time   CHOL 110 03/11/2015 0508   CHOL 139 11/17/2010   TRIG 100 03/11/2015 0508   TRIG 152 11/17/2010   HDL 38* 03/11/2015 6195  CHOLHDL 2.9 03/11/2015 0508   VLDL 20 03/11/2015 0508   LDLCALC 52 03/11/2015 0508   Lab Results  Component Value Date   HGBA1C 8.8* 03/10/2015   No results found for: LABOPIA, COCAINSCRNUR, LABBENZ, AMPHETMU, THCU, LABBARB  No results for input(s): ETH in the last 168 hours.  I have personally reviewed the radiological images below and agree with the radiology interpretations.  Dg Chest 2 View  03/10/2015    IMPRESSION: No acute pulmonary process.      Ct Head Wo Contrast  03/11/2015   IMPRESSION: No CT findings of acute ischemia or cerebral edema. No significant change from prior.    03/10/2015   IMPRESSION: No acute intracranial  abnormality. Diffuse atrophy. Chronic small vessel ischemic changes in the anterior limbs of the internal capsules.     CUS - pending  2D echo - pending  PHYSICAL EXAM Mental Status: Alert, disoriented to year-month-day and place. Dysarthria. Able to follow 3 step commands without difficulty. Cranial Nerves: II: Discs flat bilaterally; Visual fields grossly normal, pupils equal, round, reactive to light and accommodation III,IV, VI: ptosis not present, extra-ocular motions intact bilaterally V,VII: smile asymmetric due to right face weakness, facial light touch sensation normal bilaterally VIII: hearing normal bilaterally IX,X: uvula rises symmetrically XI: bilateral shoulder shrug XII: midline tongue extension without atrophy or fasciculations Motor: Right :Upper extremity 5/5Left: Upper extremity 5/5 Lower extremity 5/5Lower extremity 5/5 Tone and bulk:normal tone throughout; no atrophy noted Sensory: Pinprick and light touch intact throughout, bilaterally Deep Tendon Reflexes:  1+ all over Plantars: Right: downgoingLeft: downgoing Cerebellar: normal finger-to-nose, normal heel-to-shin test Gait: Not assessed  ASSESSMENT/PLAN [name] is a 81 y.o. male with history of HTN, DM, hyperlipidemia, CAD, MI, chronic congestive heart failure s/p pacemaker, remote left internal capsule infarct, dementia, and syncope presenting with acute unresponsiveness and confusion. He did not receive IV t-PA due to improved symptoms.   Acute unresponsiveness and confusion - Secondary syncope vs. Hypoglycemia. NOT consistent with TIA. Patient had acute episode of passing out, in the setting of glucose 85. As per wife, when glucose less than 100, patient may be confused. ER concerning for right facial droop, however as per wife, that was his  baseline facial features. Do not think this is a TIA episode.  MRI/MRA  Unable to do due to pacemaker  CT and CT repeat no acute abnormality  Carotid Doppler  pending  2D Echo  Pending  LDL 52, at the goal  HgbA1c 8.8, not at the goal  Heparin for VTE prophylaxis Diet Carb Modified Fluid consistency:: Thin; Room service appropriate?: Yes  aspirin 81 mg orally every day prior to admission, now on aspirin 81 mg orally every day. Continue antiplatelet on discharge  Patient counseled to be compliant with his antithrombotic medications  Ongoing aggressive stroke risk factor management  Therapy recommendations:  Pending  Disposition:  Pending  Hypertension  Home meds:  norvasc  BP goal normotensive, as patient does not have TIA episode  Norvasc resumed  BP monitoring   Patient counseled to be compliant with his blood pressure medications  Hyperlipidemia  Home meds:  Crestor resumed in hospital  LDL 52, goal < 70  Continue statin at discharge  Diabetes  HgbA1c 8.8, goal < 7.0  Uncontrolled  On Lantus  SSI  DM education  Management as per primary team  Other Stroke Risk Factors  Advanced age  Hx stroke  Family hx stroke (mother)  Coronary artery disease, [facility]  day # 1  Patient seen by and discussed with Dr. Erlinda Hong.   Lanelle Bal, PA-C [facility].  I, the attending vascular neurologist, have personally obtained a history, examined the patient, evaluated laboratory data, individually viewed imaging studies and agree with radiology interpretations. I also obtained additional history from pt's wife at bedside. I also discussed with Dr. Candiss Norse regarding his care plan. Together with the NP/PA, we formulated the assessment and plan of care which reflects our mutual decision.  I have made any additions or clarifications directly to the above note and agree with the findings and plan as currently documented.   81 year old male  with multiple stroke risk factors admitted for episode of passing out. By detailed history from wife, the episode is more consistent with syncope versus hypoglycemia. You are concerning for right facial droop, however as per wife, this is his baseline facial structure. Episode not consistent with TIA. However, he does have stroke risk factors, would continue stroke workup. Carotid Doppler and 2-D Echo pending. LDL 52, continue Crestor. A1c 8.8, glucose uncontrolled, need aggressive risk factor modification. Continue aspirin and statin for stroke prevention. Awaiting 2-D echo and carotid Doppler.  Rosalin Hawking, MD PhD Stroke Neurology 03/11/2015 3:59 PM      To contact Stroke Continuity provider, please refer to [email or web address] After hours, contact General Neurology

## 2015-03-12 ENCOUNTER — Telehealth: Payer: Self-pay | Admitting: Family Medicine

## 2015-03-12 LAB — GLUCOSE, CAPILLARY
GLUCOSE-CAPILLARY: 88 mg/dL (ref 70–99)
Glucose-Capillary: 84 mg/dL (ref 70–99)
Glucose-Capillary: 154 mg/dL — ABNORMAL HIGH (ref 70–99)

## 2015-03-12 LAB — HEMOGLOBIN A1C
Hgb A1c MFr Bld: 8.8 % — ABNORMAL HIGH (ref 4.8–5.6)
MEAN PLASMA GLUCOSE: 206 mg/dL

## 2015-03-12 MED ORDER — INSULIN GLARGINE 100 UNIT/ML ~~LOC~~ SOLN: SUBCUTANEOUS | Status: DC

## 2015-03-12 MED ORDER — ASPIRIN 81 MG PO TBEC: 81.0000 mg | DELAYED_RELEASE_TABLET | Freq: Every day | ORAL | Status: DC

## 2015-03-12 NOTE — Progress Notes (Signed)
STROKE TEAM PROGRESS NOTE   HISTORY Frank Simpson is an 81 y.o. male with multiple medical problems including HTN, DM, hyperlipidemia, CAD, MI, chronic congestive heart failure, remote left internal capsule infarct, dementia, and syncope, brought to [facility] ED via EMS due to acute onset of unresponsiveness and confusion. Wife is at the bedside. As per EMS and wife, patient went to see his PCP with concerns about possible right leg cellulitis when he lost consciousness. (LKW 03/10/2015 at 1035a). Per EMS pt was not responding to staff and was very sweaty. On arrival pt was responding, alert, with right sided facial droop. Reportedly, his blood sugar was 85 and he was given orange juice. Wife indicated that he had passed out in the past and he can get more confused than usual when his blood sugar gets into the low 80s. Patient denies HA, vertigo, double vision, focal weakness or numbness or vision disturbances. NIHSS 6. CT brain showed no acute abnormality.  tPA was not  Given as pt with mild deficits that are improving, patient with dementia and concerned this episode could be related to low blood sugar. Further, some deficits probably related to underlying dementia and remote stroke left brain. NIHSS: 6 He was admitted to the neuro 4N for further evaluation and treatment.   SUBJECTIVE (INTERVAL HISTORY) His wife is at the bedside. He is lying in bed. They are planning for discharge today. They are glad he did not have a stroke.   OBJECTIVE Temp:  [97.5 F (36.4 C)-99 F (37.2 C)] 98.1 F (36.7 C) (04/14 0944) Pulse Rate:  [59-70] 62 (04/14 0944) Cardiac Rhythm:  [-] Atrial paced;Heart block (04/13 2000) Resp:  [16-18] 18 (04/14 0944) BP: (144-177)/(70-80) 161/73 mmHg (04/14 0944) SpO2:  [96 %-100 %] 98 % (04/14 0944)   Recent Labs Lab 03/11/15 1630 03/11/15 1944 03/11/15 2357 03/12/15 0355 03/12/15 0742  GLUCAP 209* 223* 101* 88 84    Recent Labs Lab 03/10/15 1209 03/11/15 0508  NA  140 140  K 3.9 3.8  CL 105 105  CO2 26 20  GLUCOSE 88 82  BUN 23 17  CREATININE 1.89* 1.45*  CALCIUM 9.0 8.6    Recent Labs Lab 03/10/15 1209 03/11/15 0508  AST 24 21  ALT 17 14  ALKPHOS 62 54  BILITOT 0.6 0.7  PROT 6.5 6.3  ALBUMIN 3.3* 2.9*    Recent Labs Lab 03/10/15 1209  WBC 8.4  NEUTROABS 5.4  HGB 15.8  HCT 44.6  MCV 88.3  PLT 150   No results for input(s): CKTOTAL, CKMB, CKMBINDEX, TROPONINI in the last 168 hours.  Recent Labs  03/10/15 1209  LABPROT 14.4  INR 1.10    Recent Labs  03/10/15 1733  COLORURINE YELLOW  LABSPEC 1.020  PHURINE 5.5  GLUCOSEU 100*  HGBUR NEGATIVE  BILIRUBINUR NEGATIVE  KETONESUR 15*  PROTEINUR 100*  UROBILINOGEN 0.2  NITRITE NEGATIVE  LEUKOCYTESUR NEGATIVE       Component Value Date/Time   CHOL 110 03/11/2015 0508   CHOL 139 11/17/2010   TRIG 100 03/11/2015 0508   TRIG 152 11/17/2010   HDL 38* 03/11/2015 0508   CHOLHDL 2.9 03/11/2015 0508   VLDL 20 03/11/2015 0508   LDLCALC 52 03/11/2015 0508   Lab Results  Component Value Date   HGBA1C 8.8* 03/11/2015   No results found for: LABOPIA, COCAINSCRNUR, LABBENZ, AMPHETMU, THCU, LABBARB  No results for input(s): ETH in the last 168 hours.  I have personally reviewed the radiological images below and  agree with the radiology interpretations.  Dg Chest 2 View  03/10/2015    IMPRESSION: No acute pulmonary process.      Ct Head Wo Contrast  03/11/2015   IMPRESSION: No CT findings of acute ischemia or cerebral edema. No significant change from prior.    03/10/2015   IMPRESSION: No acute intracranial abnormality. Diffuse atrophy. Chronic small vessel ischemic changes in the anterior limbs of the internal capsules.     Carotid Doppler  There is 1-39% bilateral ICA stenosis. Vertebral artery flow is antegrade.    2D Echocardiogram   Left ventricle: Distal septal hypokinesis. The cavity size wasnormal. Wall thickness was increased in a pattern of mild  LVH.Systolic function was normal. The estimated ejection fraction wasin the range of 50% to 55%. Wall motion was normal; there were noregional wall motionabnormalities. Doppler parameters are consistent with abnormal left ventricular relaxation (grade 1diastolic dysfunction).   PHYSICAL EXAM Mental Status: Alert, disoriented to year-month-day and place. Dysarthria. Able to follow 3 step commands without difficulty. Cranial Nerves: II: Discs flat bilaterally; Visual fields grossly normal, pupils equal, round, reactive to light and accommodation III,IV, VI: ptosis not present, extra-ocular motions intact bilaterally V,VII: smile asymmetric due to right face weakness, facial light touch sensation normal bilaterally VIII: hearing normal bilaterally IX,X: uvula rises symmetrically XI: bilateral shoulder shrug XII: midline tongue extension without atrophy or fasciculations Motor: Right :Upper extremity 5/5Left: Upper extremity 5/5 Lower extremity 5/5Lower extremity 5/5 Tone and bulk:normal tone throughout; no atrophy noted Sensory: Pinprick and light touch intact throughout, bilaterally Deep Tendon Reflexes:  1+ all over Plantars: Right: downgoingLeft: downgoing Cerebellar: normal finger-to-nose, normal heel-to-shin test Gait: Not assessed  ASSESSMENT/PLAN Frank Simpson is a 81 y.o. male with history of HTN, DM, hyperlipidemia, CAD, MI, chronic congestive heart failure s/p pacemaker, remote left internal capsule infarct, dementia, and syncope presenting with acute unresponsiveness and confusion. He did not receive IV t-PA due to improved symptoms.   Acute unresponsiveness and confusion - Secondary syncope vs. Hypoglycemia. NOT consistent with TIA. Patient had acute episode of passing out, in the setting of glucose 85. As per wife, when  glucose less than 100, patient may be confused. ER concerning for right facial droop, however as per wife, that was his baseline facial features. Do not think this is a TIA episode.  MRI/MRA  Unable to do due to pacemaker  CT and CT repeat no acute abnormality  Carotid Doppler  No significant stenosis   2D Echo  No source of embolus   LDL 52, at the goal  HgbA1c 8.8, not at the goal  Heparin for VTE prophylaxis Diet Carb Modified Fluid consistency:: Thin; Room service appropriate?: Yes  aspirin 81 mg orally every day prior to admission, now on aspirin 81 mg orally every day. Continue antiplatelet on discharge  Patient counseled to be compliant with his antithrombotic medications  Ongoing aggressive stroke risk factor management  Therapy recommendations:  No PT  Disposition:  Return home  Hypertension  Home meds:  norvasc  BP goal normotensive, as patient does not have TIA episode  Norvasc resumed  BP monitoring   Hyperlipidemia  Home meds:  Crestor resumed in hospital  LDL 52  Continue statin at discharge  Diabetes  HgbA1c 8.8, goal < 7.0  Uncontrolled  On Lantus  SSI  DM education  Management as per primary team  Other Stroke Risk Factors  Advanced age  Hx stroke  Family hx stroke (mother)  Coronary artery disease, MI  Other pertinent problems  Hx bradycardia s/p [employer] pacer, card consulted  NO FURTHER STROKE WORKUP INDICATED  Ongoing risk factor control by Primary Care Physician  Stroke Service will sign off. Please call should any needs arise.  No stroke Follow-up indicated  Hospital day # [place] for Pager information 03/12/2015 10:32 AM   I, the attending vascular neurologist, have personally obtained a history, examined the patient, evaluated laboratory data, individually viewed imaging studies and agree with radiology interpretations.  Together with the NP/PA, we formulated the  assessment and plan of care which reflects our mutual decision.  I have made any additions or clarifications directly to the above note and agree with the findings and plan as currently documented.   81 year old male with multiple stroke risk factors admitted for episode of passing out. By detailed history from wife, the episode is more consistent with syncope versus hypoglycemia. ER concerning for right facial droop, however as per wife, this is his baseline facial structure. Episode not consistent with TIA. However, he does have stroke risk factors, would continue stroke workup. Carotid Doppler and 2-D Echo unremarkable. LDL 52, continue Crestor. A1c 8.8, glucose uncontrolled, need aggressive risk factor modification. Continue aspirin and statin for stroke prevention.  Neurology will sign off. Please call with questions. No neurology follow up needed. He needs to see PCP soon regarding DM control. Thanks for the consult.  Rosalin Hawking, MD PhD Stroke Neurology 03/12/2015 4:12 PM        To contact Stroke Continuity provider, please refer to [email or web address] After hours, contact General Neurology

## 2015-03-12 NOTE — Progress Notes (Signed)
Patient is discharged from room 4N01 at this time. Alert and in stable condition. No IV access at this time and tele d/c'd. Instructions read to patient and wife and understanding verbalized. Left unit via wheelchair with all belongings at side.

## 2015-03-12 NOTE — Telephone Encounter (Signed)
Patient already has follow up scheduled.

## 2015-03-12 NOTE — Discharge Summary (Signed)
Frank Simpson, is a 81 y.o. male  DOB [birth year]  MRN [id number].  Admission date:  03/10/2015  Admitting Physician  Costin Karlyne Greenspan, MD  Discharge Date:  03/12/2015   Primary MD  [name], MD  Recommendations for primary care physician for things to follow:   Monitor secondary risk factors for stroke and CBGs/glycemic control closely.   Admission Diagnosis  Unresponsiveness [R40.4] Stroke with cerebral ischemia [I63.9]   Discharge Diagnosis  Unresponsiveness [R40.4] Stroke with cerebral ischemia [I63.9]     Principal Problem:   Syncope and collapse Active Problems:   Hypercholesterolemia   CAD (coronary artery disease)   Acute on chronic systolic CHF (congestive heart failure)   Benign hypertension with CKD (chronic kidney disease) stage III   Diabetes mellitus type 2 with retinopathy   Benign positional vertigo   Pacemaker   Cellulitis of right lower extremity   Unresponsive episode   Hypoglycemia      Past Medical History  Diagnosis Date  . Hypertension   . Diabetes mellitus type 2 with retinopathy 1994    dm edu 2012  . Hyperlipidemia   . Osteoarthritis   . CRI (chronic renal insufficiency)     baseline Cr seems to be 1.7-1.8  . Urinary incontinence     s/p PTNS didn't help  . Glaucoma     and cataracts  . History of melanoma   . CAD (coronary artery disease)     07/2012 acute STEMI, mid LAD PCI - DES; cath 09/2012 patent LAD stent, non-hemodynamically significant Left Main/LAD disease, EF 55%  . Thrombocytopenia   . BCC (basal cell carcinoma of skin) 2015    L neck (Dr. Sherrye Payor), L forearm [facility])  . Complication of anesthesia     confused after cath 10/15/2012  . CVA (cerebral infarction) 09/2012    remote anterior limb of left internal capsule  . Post herpetic  neuralgia   . Shingles in March 2014    right chest, across the back  . Syncope 04/02/2013  . Blurred vision   . Dysplastic nevus 2015    L upper back, Lat margin involved ([facility])  . CHF (congestive heart failure)   . Ischemic heart disease   . Benign positional vertigo     Past Surgical History  Procedure Laterality Date  . Replacement total knee  04/2010    RIGHT KNEE  . Tonsillectomy    . Cardiovascular stress test  04/27/2010    EF 75%, nuclear stress test with normal perfusion, no ischemia  . Foot surgery      metal pin in place  . Right shoulder    . Finger surgery      amputated finger  . Lexiscan myoview  10/2011    negative for ischemia  . Cataract extraction  12/12, 1/13    bilateral  . Coronary stent placement  07/2012    DES to mid LAD for STEMI  . Cardiac catheterization  10/15/2012  . Carotid US  03/2013    WNL  .  US echocardiography  03/2013    inf/septal hypokinesis, mild LVH, EF 45%, LA mildly dilated  . Penile prosthesis implant    . Left heart catheterization with coronary angiogram N/A 08/06/2012    Procedure: LEFT HEART CATHETERIZATION WITH CORONARY ANGIOGRAM;  Surgeon: Burnell Blanks, MD;  Location: [facility] CATH LAB;  Service: Cardiovascular;  Laterality: N/A;  . Left heart catheterization with coronary angiogram N/A 10/15/2012    Procedure: LEFT HEART CATHETERIZATION WITH CORONARY ANGIOGRAM;  Surgeon: Peter M Martinique, MD;  Location: [facility] CATH LAB;  Service: Cardiovascular;  Laterality: N/A;  . Permanent pacemaker insertion N/A 10/06/2014    Procedure: PERMANENT PACEMAKER INSERTION;  Surgeon: Evans Lance, MD;  Location: [facility] CATH LAB;  Service: Cardiovascular;  Laterality: N/A;       History of present illness and  Hospital Course:     Kindly see H&P for history of present illness and admission details, please review complete Labs, Consult reports and Test reports for all details in brief  HPI  from the history and physical done on the day of  admission  Frank Simpson is a 81 y.o. male, with a past medical history of insulin-dependent diabetes mellitus, chronic renal insufficiency, coronary artery disease, previous CVA, and hypertension as well as CHF. He presents from his primary care physician's office after a syncopal episode. Per Mrs. Lowman her husband went to his PCP for a follow-up visit after being treated for right lower extremity cellulitis. Towards the end of the visit her husband became lethargic, pale, and diaphoretic. He laid down on the exam table and would not follow commands for approximately 30 minutes. Her husband is normally incontinent of urine, there was no tongue bite or convulsive movements. In the emergency department labs are essentially within normal limits other than a creatinine that is mildly elevated from baseline. Urinalysis is pending. CT head shows no acute intracranial abnormality. The patient is unable to receive an MRI as he has a pacemaker. He was seen by neurology who recommends admission for possible TIA. His wife reports that since November when he had a concussion he has been having episodes of being "swimmy headed" and he has frequent falls. This week he has fallen twice while in the bathroom.   Hospital Course    1. Syncope - ? if this was combination of dehydration and mild relative hypoglycemia. Improved after hydration, CT head 2 unremarkable cannot do MRI as he has a pacemaker. Was mildly orthostatic upon arrival. Had stable echogram and carotid duplex. LDL at goal. A1c 8.8 (permissive hyperglycemia as he is overtly sensitive to sugars around 80 and below). Cleared by neurology for home discharge. Continue aspirin for now.   2. Hx of bradycardia status post [employer] pacemaker placement. Stable.    3. DM type II. Apparently he is very sensitive to sugars below 80. Upon arrival sugar was 80. A1c was 8.8. Monitor CBGs. Keep him slightly above 100 if possible. Have reduced home dose Lantus by 2  units twice a day. We'll recommend 1 time outpatient endocrine follow-up.  CBG (last 3)   Recent Labs (last 2 labs)      Recent Labs  03/11/15 0015 03/11/15 0501 03/11/15 0740  GLUCAP 99 84 99       Recent Labs    Lab Results  Component Value Date   HGBA1C 8.8* 03/10/2015       4. Dsylipidemia on statin LDL below 70.    5. Acute renal failure on CK D stage III.  Baseline creatinine around 1.4. At baseline after hydration.    6. Mild lower extremity cellulitis. Almost resolved on doxycycline.    7. Dementia. Supportive care        Discharge Condition: Stable   Follow UP  Follow-up Information    Follow up with [name], MD. Schedule an appointment as soon as possible for a visit in 1 week.   Specialty:  Family Medicine   Contact information:   [place] [address] [phone]       Follow up with [facility]. Schedule an appointment as soon as possible for a visit in 1 week.   Why:  TIA   Contact information:   [address] [place] [address] [phone]        Discharge Instructions  and  Discharge Medications      Discharge Instructions    Discharge instructions    Complete by:  As directed   Follow with Primary MD [name], MD in 7 days   Get CBC, CMP, 2 view Chest X ray checked  by Primary MD next visit.    Activity: As tolerated with Full fall precautions use walker/cane & assistance as needed   Disposition Home     Diet: Heart Healthy Low Carb  Accuchecks 4 times/day, Once in AM empty stomach and then before each meal. Log in all results and show them to your Prim.MD in 3 days. If any glucose reading is under 80 or above 300 call your Prim MD immidiately. Follow Low glucose instructions for glucose under 80 as instructed.    For Heart failure patients - Check your Weight same time everyday, if you gain over 2 pounds,  or you develop in leg swelling, experience more shortness of breath or chest pain, call your Primary MD immediately. Follow Cardiac Low Salt Diet and 1.5 lit/day fluid restriction.   On your next visit with your primary care physician please Get Medicines reviewed and adjusted.   Please request your Prim.MD to go over all Hospital Tests and Procedure/Radiological results at the follow up, please get all Hospital records sent to your Prim MD by signing hospital release before you go home.   If you experience worsening of your admission symptoms, develop shortness of breath, life threatening emergency, suicidal or homicidal thoughts you must seek medical attention immediately by calling 911 or calling your MD immediately  if symptoms less severe.  You Must read complete instructions/literature along with all the possible adverse reactions/side effects for all the Medicines you take and that have been prescribed to you. Take any new Medicines after you have completely understood and accpet all the possible adverse reactions/side effects.   Do not drive, operating heavy machinery, perform activities at heights, swimming or participation in water activities or provide baby sitting services if your were admitted for syncope or siezures until you have seen by Primary MD or a Neurologist and advised to do so again.  Do not drive when taking Pain medications.    Do not take more than prescribed Pain, Sleep and Anxiety Medications  Special Instructions: If you have smoked or chewed Tobacco  in the last 2 yrs please stop smoking, stop any regular Alcohol  and or any Recreational drug use.  Wear Seat belts while driving.   Please note  You were cared for by a hospitalist during your hospital stay. If you have any questions about your discharge medications or the care you received  while you were in the hospital after you are discharged, you can call the unit and asked to speak with the hospitalist on  call if the hospitalist that took care of you is not available. Once you are discharged, your primary care physician will handle any further medical issues. Please note that NO REFILLS for any discharge medications will be authorized once you are discharged, as it is imperative that you return to your primary care physician (or establish a relationship with a primary care physician if you do not have one) for your aftercare needs so that they can reassess your need for medications and monitor your lab values.     Increase activity slowly    Complete by:  As directed             Medication List    TAKE these medications        acetaminophen 500 MG tablet  Commonly known as:  TYLENOL  Take 500 mg by mouth every 6 (six) hours as needed (pain).     amLODipine 5 MG tablet  Commonly known as:  NORVASC  Take 1 tablet (5 mg total) by mouth daily.     aspirin 81 MG chewable tablet  Chew 1 tablet (81 mg total) by mouth daily.     cyanocobalamin 1000 MCG/ML injection  Commonly known as:  (VITAMIN B-12)  Inject 1 mL (1,000 mcg total) into the muscle every 30 (thirty) days.     doxycycline 100 MG tablet  Commonly known as:  VIBRA-TABS  Take 1 tablet (100 mg total) by mouth 2 (two) times daily.     glucose blood test strip  Commonly known as:  ONE TOUCH ULTRA TEST  Check four times daily,Dx E11.319     insulin glargine 100 UNIT/ML injection  Commonly known as:  LANTUS  Inject 20 units into the skin twice a day     insulin lispro 100 UNIT/ML injection  Commonly known as:  HUMALOG  Sliding scale as directed 5-15 units three times daily before meals.     Insulin Pen Needle 31G X 8 MM Misc  Use as directed with Lantus solostar. Dx 250.62.     Insulin Syringe-Needle U-100 31G X 5/16" 0.5 ML Misc  Commonly known as:  BD INSULIN SYRINGE ULTRAFINE  Inject 4x a day     isosorbide mononitrate 30 MG 24 hr tablet  Commonly known as:  IMDUR  Take 1 tablet (30 mg total) by mouth daily.      meclizine 25 MG tablet  Commonly known as:  ANTIVERT  Take 25 mg by mouth every 6 (six) hours as needed for dizziness.     nitroGLYCERIN 0.4 MG SL tablet  Commonly known as:  NITROSTAT  Place 1 tablet (0.4 mg total) under the tongue every 5 (five) minutes as needed for chest pain. Up to three doses,if pain continues call 911     rosuvastatin 40 MG tablet  Commonly known as:  CRESTOR  Take 1 tablet (40 mg total) by mouth daily.     sertraline 25 MG tablet  Commonly known as:  ZOLOFT  Take 1 tablet (25 mg total) by mouth daily.     tamsulosin 0.4 MG Caps capsule  Commonly known as:  FLOMAX  Take 1 capsule (0.4 mg total) by mouth daily.          Diet and Activity recommendation: See Discharge Instructions above   Consults obtained - Neuro   Major procedures and Radiology Reports -  PLEASE review detailed and final reports for all details, in brief -    TTE   - Left ventricle: Distal septal hypokinesis. The cavity size was normal. Wall thickness was increased in a pattern of mild LVH. Systolic function was normal. The estimated ejection fraction was in the range of 50% to 55%. Wall motion was normal; there were no regional wall motion abnormalities. Doppler parameters are consistent with abnormal left ventricular relaxation (grade 1 diastolic dysfunction).   Carotid Duplex completed.   Preliminary report: Carotid velocities and ratios within the 1-39% range of stenosis bilaterally. Vertebral arteries are patent and antegrade bilaterally.  Dg Chest 2 View  03/10/2015   CLINICAL DATA:  TIA.  EXAM: CHEST  2 VIEW  COMPARISON:  04/02/2013  FINDINGS: Placement of dual lead left-sided pacemaker, tips projecting over the right atrium and ventricle. The cardiomediastinal contours are unchanged with mild tortuosity of the thoracic aorta. A coronary stent is seen. No pulmonary edema. No consolidation, pleural effusion, or pneumothorax. No acute osseous abnormalities  are seen.  IMPRESSION: No acute pulmonary process.   Electronically Signed   By: Jeb Levering M.D.   On: 03/10/2015 21:01   Ct Head Wo Contrast  03/11/2015   CLINICAL DATA:  Stroke with cerebral ischemia. Patient reports syncope.  EXAM: CT HEAD WITHOUT CONTRAST  TECHNIQUE: Contiguous axial images were obtained from the base of the skull through the vertex without intravenous contrast.  COMPARISON:  12 hr prior.  FINDINGS: No intracranial hemorrhage, mass effect, or midline shift. No evolving ischemia. No cerebral edema, greater weight differentiation is preserved. Chronic small vessel ischemic change and atrophy, stable from prior. The basilar cisterns are patent. No intracranial fluid collection. Calvarium is intact. Included paranasal sinuses and mastoid air cells are well aerated.  IMPRESSION: No CT findings of acute ischemia or cerebral edema. No significant change from prior.   Electronically Signed   By: Jeb Levering M.D.   On: 03/11/2015 01:33   Ct Head (brain) Wo Contrast  03/10/2015   CLINICAL DATA:  Acute altered mental status.  EXAM: CT HEAD WITHOUT CONTRAST  TECHNIQUE: Contiguous axial images were obtained from the base of the skull through the vertex without intravenous contrast.  COMPARISON:  04/02/2013  FINDINGS: No mass lesion. No midline shift. No acute hemorrhage or hematoma. No extra-axial fluid collections. No evidence of acute infarction. There is diffuse slight cerebral cortical and cerebellar atrophy. Chronic small vessel ischemic changes in the anterior limbs of both internal capsules, stable. No osseous abnormality.  IMPRESSION: No acute intracranial abnormality. Diffuse atrophy. Chronic small vessel ischemic changes in the anterior limbs of the internal capsules.  Critical Value/emergent results were called by telephone at the time of interpretation on 03/10/2015 at 12:28 pm to Dr. Verlon Setting, who verbally acknowledged these results.   Electronically Signed   By: Lorriane Shire  M.D.   On: 03/10/2015 12:28    Micro Results      No results found for this or any previous visit (from the past 240 hour(s)).     Today   Subjective:   Frank Simpson today has no headache,no chest abdominal pain,no new weakness tingling or numbness, feels much better wants to go home today.   Objective:   Blood pressure 161/73, pulse 62, temperature 98.1 F (36.7 C), temperature source Oral, resp. rate 18, height 6' (1.829 m), weight 92.999 kg (205 lb 0.4 oz), SpO2 98 %.   Intake/Output Summary (Last 24 hours) at 03/12/15 0959 Last data filed at  03/12/15 0338  Gross per 24 hour  Intake      0 ml  Output    100 ml  Net   -100 ml    Exam Awake Alert, Oriented x 3, No new F.N deficits, Normal affect [place].AT,PERRAL Supple Neck,No JVD, No cervical lymphadenopathy appriciated.  Symmetrical Chest wall movement, Good air movement bilaterally, CTAB RRR,No Gallops,Rubs or new Murmurs, No Parasternal Heave +ve B.Sounds, Abd Soft, Non tender, No organomegaly appriciated, No rebound -guarding or rigidity. No Cyanosis, Clubbing or edema, No new Rash or bruise  Data Review   CBC w Diff: Lab Results  Component Value Date   WBC 8.4 03/10/2015   HGB 15.8 03/10/2015   HCT 44.6 03/10/2015   PLT 150 03/10/2015   LYMPHOPCT 28 03/10/2015   MONOPCT 8 03/10/2015   EOSPCT 1 03/10/2015   BASOPCT 0 03/10/2015    CMP: Lab Results  Component Value Date   NA 140 03/11/2015   NA 142 11/17/2010   K 3.8 03/11/2015   K 4.3 11/17/2010   CL 105 03/11/2015   CL 104 11/17/2010   CO2 20 03/11/2015   CO2 28 11/17/2010   BUN 17 03/11/2015   BUN 24* 11/17/2010   CREATININE 1.45* 03/11/2015   CREATININE 1.89* 09/30/2011   PROT 6.3 03/11/2015   PROT 6.9 11/17/2010   ALBUMIN 2.9* 03/11/2015   BILITOT 0.7 03/11/2015   BILITOT 0.6 11/17/2010   ALKPHOS 54 03/11/2015   ALKPHOS 42 11/17/2010   AST 21 03/11/2015   AST 18 11/17/2010   ALT 14 03/11/2015  . Lab Results  Component Value Date     CHOL 110 03/11/2015   HDL 38* 03/11/2015   LDLCALC 52 03/11/2015   LDLDIRECT 55.0 12/30/2014   TRIG 100 03/11/2015   CHOLHDL 2.9 03/11/2015    Lab Results  Component Value Date   HGBA1C 8.8* 03/11/2015        Total Time in preparing paper work, data evaluation and todays exam - 35 minutes  Thurnell Lose M.D on 03/12/2015 at 9:59 AM  [facility]   Office  [phone]

## 2015-03-12 NOTE — Telephone Encounter (Signed)
Attempted to call patient. No answer, no machine. Will try again later.

## 2015-03-12 NOTE — Discharge Instructions (Signed)
Follow with Primary MD [name], MD in 7 days   Get CBC, CMP, 2 view Chest X ray checked  by Primary MD next visit.    Activity: As tolerated with Full fall precautions use walker/cane & assistance as needed   Disposition Home     Diet: Heart Healthy Low Carb  Accuchecks 4 times/day, Once in AM empty stomach and then before each meal. Log in all results and show them to your Prim.MD in 3 days. If any glucose reading is under 80 or above 300 call your Prim MD immidiately. Follow Low glucose instructions for glucose under 80 as instructed.    For Heart failure patients - Check your Weight same time everyday, if you gain over 2 pounds, or you develop in leg swelling, experience more shortness of breath or chest pain, call your Primary MD immediately. Follow Cardiac Low Salt Diet and 1.5 lit/day fluid restriction.   On your next visit with your primary care physician please Get Medicines reviewed and adjusted.   Please request your Prim.MD to go over all Hospital Tests and Procedure/Radiological results at the follow up, please get all Hospital records sent to your Prim MD by signing hospital release before you go home.   If you experience worsening of your admission symptoms, develop shortness of breath, life threatening emergency, suicidal or homicidal thoughts you must seek medical attention immediately by calling 911 or calling your MD immediately  if symptoms less severe.  You Must read complete instructions/literature along with all the possible adverse reactions/side effects for all the Medicines you take and that have been prescribed to you. Take any new Medicines after you have completely understood and accpet all the possible adverse reactions/side effects.   Do not drive, operating heavy machinery, perform activities at heights, swimming or participation in water activities or provide baby sitting services if your were admitted for syncope or siezures until you have  seen by Primary MD or a Neurologist and advised to do so again.  Do not drive when taking Pain medications.    Do not take more than prescribed Pain, Sleep and Anxiety Medications  Special Instructions: If you have smoked or chewed Tobacco  in the last 2 yrs please stop smoking, stop any regular Alcohol  and or any Recreational drug use.  Wear Seat belts while driving.   Please note  You were cared for by a hospitalist during your hospital stay. If you have any questions about your discharge medications or the care you received while you were in the hospital after you are discharged, you can call the unit and asked to speak with the hospitalist on call if the hospitalist that took care of you is not available. Once you are discharged, your primary care physician will handle any further medical issues. Please note that NO REFILLS for any discharge medications will be authorized once you are discharged, as it is imperative that you return to your primary care physician (or establish a relationship with a primary care physician if you do not have one) for your aftercare needs so that they can reassess your need for medications and monitor your lab values.

## 2015-03-12 NOTE — Telephone Encounter (Signed)
plz call for hospital f/u phone call today or tomorrow and schedule hosp f/u in 1-2 wks. Admitted with unresponsiveness after seen here on Monday, thought hypoglycemia related. lantus decreased. CT negative for acute stroke.

## 2015-03-16 NOTE — ED Provider Notes (Signed)
CSN: [id number]     Arrival date & time 03/10/15  1205 History   First MD Initiated Contact with Patient 03/10/15 1216     Chief Complaint  Patient presents with  . Code Stroke     (Consider location/radiation/quality/duration/timing/severity/associated sxs/prior Treatment) HPI   He presents from his primary care physician's office after a syncopal episode. Per Mrs. Lowman her husband went to his PCP for a follow-up visit after being treated for right lower extremity cellulitis. Towards the end of the visit her husband became lethargic, pale, and diaphoretic. He laid down on the exam table and would not follow commands for approximately 30 minutes. Her husband is normally incontinent of urine, there was no tongue bite or convulsive movements  Past Medical History  Diagnosis Date  . Hypertension   . Diabetes mellitus type 2 with retinopathy 1994    dm edu 2012  . Hyperlipidemia   . Osteoarthritis   . CRI (chronic renal insufficiency)     baseline Cr seems to be 1.7-1.8  . Urinary incontinence     s/p PTNS didn't help  . Glaucoma     and cataracts  . History of melanoma   . CAD (coronary artery disease)     07/2012 acute STEMI, mid LAD PCI - DES; cath 09/2012 patent LAD stent, non-hemodynamically significant Left Main/LAD disease, EF 55%  . Thrombocytopenia   . BCC (basal cell carcinoma of skin) 2015    L neck (Dr. Sherrye Payor), L forearm [facility])  . Complication of anesthesia     confused after cath 10/15/2012  . CVA (cerebral infarction) 09/2012    remote anterior limb of left internal capsule  . Post herpetic neuralgia   . Shingles in March 2014    right chest, across the back  . Syncope 04/02/2013  . Blurred vision   . Dysplastic nevus 2015    L upper back, Lat margin involved ([place])  . CHF (congestive heart failure)   . Ischemic heart disease   . Benign positional vertigo    Past Surgical History  Procedure Laterality Date  . Replacement total knee  04/2010   RIGHT KNEE  . Tonsillectomy    . Cardiovascular stress test  04/27/2010    EF 75%, nuclear stress test with normal perfusion, no ischemia  . Foot surgery      metal pin in place  . Right shoulder    . Finger surgery      amputated finger  . Lexiscan myoview  10/2011    negative for ischemia  . Cataract extraction  12/12, 1/13    bilateral  . Coronary stent placement  07/2012    DES to mid LAD for STEMI  . Cardiac catheterization  10/15/2012  . Carotid US  03/2013    WNL  . US echocardiography  03/2013    inf/septal hypokinesis, mild LVH, EF 45%, LA mildly dilated  . Penile prosthesis implant    . Left heart catheterization with coronary angiogram N/A 08/06/2012    Procedure: LEFT HEART CATHETERIZATION WITH CORONARY ANGIOGRAM;  Surgeon: Burnell Blanks, MD;  Location: [facility] CATH LAB;  Service: Cardiovascular;  Laterality: N/A;  . Left heart catheterization with coronary angiogram N/A 10/15/2012    Procedure: LEFT HEART CATHETERIZATION WITH CORONARY ANGIOGRAM;  Surgeon: Peter M Martinique, MD;  Location: [facility] CATH LAB;  Service: Cardiovascular;  Laterality: N/A;  . Permanent pacemaker insertion N/A 10/06/2014    Procedure: PERMANENT PACEMAKER INSERTION;  Surgeon: Evans Lance, MD;  Location: [facility]  CATH LAB;  Service: Cardiovascular;  Laterality: N/A;   Family History  Problem Relation Age of Onset  . Stroke Mother     hemorrhage  . Diabetes Mother   . Cancer Father     lung  . Diabetes Brother    History  Substance Use Topics  . Smoking status: Never Smoker   . Smokeless tobacco: Never Used  . Alcohol Use: No    Review of Systems  All systems reviewed and negative, other than as noted in HPI.   Allergies  Januvia; Januvia; Metformin and related; Oxycodone; and Penicillins  Home Medications   Prior to Admission medications   Medication Sig Start Date End Date Taking? Authorizing Provider  acetaminophen (TYLENOL) 500 MG tablet Take 500 mg by mouth every 6 (six) hours as  needed (pain).    Yes Historical Provider, MD  amLODipine (NORVASC) 5 MG tablet Take 1 tablet (5 mg total) by mouth daily. 02/05/15  Yes Darlin Coco, MD  aspirin 81 MG chewable tablet Chew 1 tablet (81 mg total) by mouth daily. 10/17/14  Yes Daniel J Angiulli, PA-C  cyanocobalamin (,VITAMIN B-12,) 1000 MCG/ML injection Inject 1 mL (1,000 mcg total) into the muscle every 30 (thirty) days. 04/23/14  Yes [name], MD  doxycycline (VIBRA-TABS) 100 MG tablet Take 1 tablet (100 mg total) by mouth 2 (two) times daily. 03/03/15  Yes Alma Friendly, NP  glucose blood (ONE TOUCH ULTRA TEST) test strip Check four times daily,Dx E11.319 09/02/14  Yes [name], MD  insulin lispro (HUMALOG) 100 UNIT/ML injection Sliding scale as directed 5-15 units three times daily before meals. Patient taking differently: Sliding scale as directed 5-15 units three times daily before meals.Sliding scale: 150-200 2 units; 201-250 4 units; 251-300 6 units; 301-350 8 units; >350 10 units; >450 15 units 12/30/14  Yes [name], MD  Insulin Pen Needle 31G X 8 MM MISC Use as directed with Lantus solostar. Dx 250.62. 07/11/13  Yes [name], MD  Insulin Syringe-Needle U-100 (BD INSULIN SYRINGE ULTRAFINE) 31G X 5/16" 0.5 ML MISC Inject 4x a day 12/04/13  Yes Philemon Kingdom, MD  isosorbide mononitrate (IMDUR) 30 MG 24 hr tablet Take 1 tablet (30 mg total) by mouth daily. 01/05/15  Yes Darlin Coco, MD  meclizine (ANTIVERT) 25 MG tablet Take 25 mg by mouth every 6 (six) hours as needed for dizziness.   Yes Historical Provider, MD  nitroGLYCERIN (NITROSTAT) 0.4 MG SL tablet Place 1 tablet (0.4 mg total) under the tongue every 5 (five) minutes as needed for chest pain. Up to three doses,if pain continues call 911 10/03/14 12/02/16 Yes Darlin Coco, MD  rosuvastatin (CRESTOR) 40 MG tablet Take 1 tablet (40 mg total) by mouth daily. 01/05/15  Yes Darlin Coco, MD  sertraline (ZOLOFT) 25 MG tablet Take 1 tablet  (25 mg total) by mouth daily. 12/30/14  Yes [name], MD  tamsulosin (FLOMAX) 0.4 MG CAPS capsule Take 1 capsule (0.4 mg total) by mouth daily. 02/03/15  Yes [name], MD  insulin glargine (LANTUS) 100 UNIT/ML injection Inject 20 units into the skin twice a day 03/12/15   Thurnell Lose, MD   BP 161/73 mmHg  Pulse 62  Temp(Src) 98.1 F (36.7 C) (Oral)  Resp 18  Ht 6' (1.829 m)  Wt 205 lb 0.4 oz (92.999 kg)  BMI 27.80 kg/m2  SpO2 98% Physical Exam  Constitutional: He appears well-developed and well-nourished. No distress.  HENT:  Head: Normocephalic and atraumatic.  Eyes: Conjunctivae are  normal. Right eye exhibits no discharge. Left eye exhibits no discharge.  Neck: Neck supple.  Cardiovascular: Normal rate, regular rhythm and normal heart sounds.  Exam reveals no gallop and no friction rub.   No murmur heard. Pulmonary/Chest: Effort normal and breath sounds normal. No respiratory distress.  Abdominal: Soft. He exhibits no distension. There is no tenderness.  Musculoskeletal: He exhibits no edema or tenderness.  Neurological: He is alert.  Laying in bed with eyes open. Pleasantly confused. Follows commands. CN 2-12 intact. Strength 5/5 b/l u/l ext. Sensation seems intact to light touch.   Skin: Skin is warm and dry.  Psychiatric: He has a normal mood and affect. His behavior is normal. Thought content normal.  Nursing note and vitals reviewed.   ED Course  Procedures (including critical care time) Labs Review Labs Reviewed  COMPREHENSIVE METABOLIC PANEL - Abnormal; Notable for the following:    Creatinine, Ser 1.89 (*)    Albumin 3.3 (*)    GFR calc non Af Amer 31 (*)    GFR calc Af Amer 36 (*)    All other components within normal limits  HEMOGLOBIN A1C - Abnormal; Notable for the following:    Hgb A1c MFr Bld 8.8 (*)    All other components within normal limits  LIPID PANEL - Abnormal; Notable for the following:    HDL 38 (*)    All other components within  normal limits  URINALYSIS, ROUTINE W REFLEX MICROSCOPIC - Abnormal; Notable for the following:    Glucose, UA 100 (*)    Ketones, ur 15 (*)    Protein, ur 100 (*)    All other components within normal limits  HEMOGLOBIN A1C - Abnormal; Notable for the following:    Hgb A1c MFr Bld 8.8 (*)    All other components within normal limits  COMPREHENSIVE METABOLIC PANEL - Abnormal; Notable for the following:    Creatinine, Ser 1.45 (*)    Albumin 2.9 (*)    GFR calc non Af Amer 43 (*)    GFR calc Af Amer 50 (*)    All other components within normal limits  URINE MICROSCOPIC-ADD ON - Abnormal; Notable for the following:    Bacteria, UA FEW (*)    Casts HYALINE CASTS (*)    All other components within normal limits  GLUCOSE, CAPILLARY - Abnormal; Notable for the following:    Glucose-Capillary 106 (*)    All other components within normal limits  GLUCOSE, CAPILLARY - Abnormal; Notable for the following:    Glucose-Capillary 209 (*)    All other components within normal limits  GLUCOSE, CAPILLARY - Abnormal; Notable for the following:    Glucose-Capillary 223 (*)    All other components within normal limits  GLUCOSE, CAPILLARY - Abnormal; Notable for the following:    Glucose-Capillary 101 (*)    All other components within normal limits  GLUCOSE, CAPILLARY - Abnormal; Notable for the following:    Glucose-Capillary 154 (*)    All other components within normal limits  CBG MONITORING, ED - Abnormal; Notable for the following:    Glucose-Capillary 143 (*)    All other components within normal limits  CBG MONITORING, ED - Abnormal; Notable for the following:    Glucose-Capillary 140 (*)    All other components within normal limits  PROTIME-INR  APTT  CBC  DIFFERENTIAL  GLUCOSE, CAPILLARY  GLUCOSE, CAPILLARY  GLUCOSE, CAPILLARY  GLUCOSE, CAPILLARY  GLUCOSE, CAPILLARY  GLUCOSE, CAPILLARY  GLUCOSE, CAPILLARY  CBG MONITORING,  ED  Randolm Idol, ED    Imaging Review No  results found.   Dg Chest 2 View  03/10/2015   CLINICAL DATA:  TIA.  EXAM: CHEST  2 VIEW  COMPARISON:  04/02/2013  FINDINGS: Placement of dual lead left-sided pacemaker, tips projecting over the right atrium and ventricle. The cardiomediastinal contours are unchanged with mild tortuosity of the thoracic aorta. A coronary stent is seen. No pulmonary edema. No consolidation, pleural effusion, or pneumothorax. No acute osseous abnormalities are seen.  IMPRESSION: No acute pulmonary process.   Electronically Signed   By: Jeb Levering M.D.   On: 03/10/2015 21:01   Ct Head Wo Contrast  03/11/2015   CLINICAL DATA:  Stroke with cerebral ischemia. Patient reports syncope.  EXAM: CT HEAD WITHOUT CONTRAST  TECHNIQUE: Contiguous axial images were obtained from the base of the skull through the vertex without intravenous contrast.  COMPARISON:  12 hr prior.  FINDINGS: No intracranial hemorrhage, mass effect, or midline shift. No evolving ischemia. No cerebral edema, greater weight differentiation is preserved. Chronic small vessel ischemic change and atrophy, stable from prior. The basilar cisterns are patent. No intracranial fluid collection. Calvarium is intact. Included paranasal sinuses and mastoid air cells are well aerated.  IMPRESSION: No CT findings of acute ischemia or cerebral edema. No significant change from prior.   Electronically Signed   By: Jeb Levering M.D.   On: 03/11/2015 01:33   Ct Head (brain) Wo Contrast  03/10/2015   CLINICAL DATA:  Acute altered mental status.  EXAM: CT HEAD WITHOUT CONTRAST  TECHNIQUE: Contiguous axial images were obtained from the base of the skull through the vertex without intravenous contrast.  COMPARISON:  04/02/2013  FINDINGS: No mass lesion. No midline shift. No acute hemorrhage or hematoma. No extra-axial fluid collections. No evidence of acute infarction. There is diffuse slight cerebral cortical and cerebellar atrophy. Chronic small vessel ischemic changes in  the anterior limbs of both internal capsules, stable. No osseous abnormality.  IMPRESSION: No acute intracranial abnormality. Diffuse atrophy. Chronic small vessel ischemic changes in the anterior limbs of the internal capsules.  Critical Value/emergent results were called by telephone at the time of interpretation on 03/10/2015 at 12:28 pm to Dr. Verlon Setting, who verbally acknowledged these results.   Electronically Signed   By: Lorriane Shire M.D.   On: 03/10/2015 12:28    EKG Interpretation   Date/Time:  Tuesday March 10 2015 12:23:56 EDT Ventricular Rate:  66 PR Interval:  67 QRS Duration: 118 QT Interval:  438 QTC Calculation: 459 R Axis:   -58 Text Interpretation:  Sinus rhythm Left anterior fascicular block Probable  left ventricular hypertrophy Anterior Q waves Confirmed by Wilson Singer  MD,  Lynnelle Mesmer ([id number]) on 03/10/2015 2:04:59 PM      MDM   Final diagnoses:  Unresponsiveness    84yM with what I suspect was actually syncope but cannot rule out TIA. Back to baseline now. Admit for further w/u.     Virgel Manifold, MD 03/16/15 [phone]

## 2015-03-18 ENCOUNTER — Encounter: Payer: Self-pay | Admitting: Family Medicine

## 2015-03-19 ENCOUNTER — Encounter: Payer: Self-pay | Admitting: Family Medicine

## 2015-03-19 ENCOUNTER — Ambulatory Visit (INDEPENDENT_AMBULATORY_CARE_PROVIDER_SITE_OTHER): Payer: Medicare Other | Admitting: Family Medicine

## 2015-03-19 VITALS — BP 130/66 | HR 71 | Temp 97.9°F | Wt 211.2 lb

## 2015-03-19 DIAGNOSIS — E1122 Type 2 diabetes mellitus with diabetic chronic kidney disease: Secondary | ICD-10-CM

## 2015-03-19 DIAGNOSIS — L03115 Cellulitis of right lower limb: Secondary | ICD-10-CM

## 2015-03-19 DIAGNOSIS — I259 Chronic ischemic heart disease, unspecified: Secondary | ICD-10-CM

## 2015-03-19 DIAGNOSIS — N3941 Urge incontinence: Secondary | ICD-10-CM

## 2015-03-19 DIAGNOSIS — E538 Deficiency of other specified B group vitamins: Secondary | ICD-10-CM

## 2015-03-19 DIAGNOSIS — R55 Syncope and collapse: Secondary | ICD-10-CM

## 2015-03-19 DIAGNOSIS — E1140 Type 2 diabetes mellitus with diabetic neuropathy, unspecified: Secondary | ICD-10-CM | POA: Diagnosis not present

## 2015-03-19 DIAGNOSIS — N183 Chronic kidney disease, stage 3 unspecified: Secondary | ICD-10-CM

## 2015-03-19 DIAGNOSIS — I129 Hypertensive chronic kidney disease with stage 1 through stage 4 chronic kidney disease, or unspecified chronic kidney disease: Secondary | ICD-10-CM

## 2015-03-19 DIAGNOSIS — E1165 Type 2 diabetes mellitus with hyperglycemia: Secondary | ICD-10-CM

## 2015-03-19 DIAGNOSIS — IMO0002 Reserved for concepts with insufficient information to code with codable children: Secondary | ICD-10-CM

## 2015-03-19 DIAGNOSIS — E11319 Type 2 diabetes mellitus with unspecified diabetic retinopathy without macular edema: Secondary | ICD-10-CM

## 2015-03-19 MED ORDER — MIRABEGRON ER 25 MG PO TB24: 25.0000 mg | ORAL_TABLET | Freq: Every day | ORAL | Status: DC

## 2015-03-19 NOTE — Progress Notes (Signed)
BP 130/66 mmHg  Pulse 71  Temp(Src) 97.9 F (36.6 C) (Oral)  Wt 211 lb 4 oz (95.822 kg)  SpO2 96%   CC: hospital f/u visit  Subjective:    Patient ID: Frank Simpson, male    DOB: 1931-01-02, 81 y.o.   MRN: [id number]  HPI: Frank Simpson is a 81 y.o. male presenting on 03/19/2015 for Hospitalization Follow-up   Reviewed recent office visit and hospitalization records. Seen here 03/10/2015 for f/u cellulitis, in office had syncopal episode followed by period of unresponsiveness. Concern for CVA vs hypoglycemia, sent to ER via EMS. Workup negative for acute stroke (CT head, carotid dopplers (1-39% stenosis bilaterally), 2D echo(distal septal hypokinesis, mild LVH, EF 50-55%, normal wall motion, mild diastolic dysfunction)). Residual deficits from prior CVA noted (R facial droop). Unresponsive episode thought 2/2 secondary syncope vs hypoglycemia (glu was 85). A1c was 8.8%. Continued aspirin, crestor, and norvasc.  Brings log of sugars over last week. Taking lantus 20u in am and 15u in pm. Sliding scale insulin with meals. Fasting sugars running well 94-160s. Lunch and PM sugars high 150-450.   Previously followed by endo (last saw Dr Cruzita Lederer 02/2014) but pt decided to continue f/u DM with PCP.  Pt never returned post hosp f/u phone call. Admission date: 03/10/2015 Admitting Physician Costin Karlyne Greenspan, MD Discharge Date: 03/12/2015  Recommendations for primary care physician for things to follow:  Monitor secondary risk factors for stroke and CBGs/glycemic control closely. Admission Diagnosis Unresponsiveness [R40.4] Stroke with cerebral ischemia [I63.9] Discharge Diagnosis Unresponsiveness [R40.4] Stroke with cerebral ischemia [I63.9]   Principal Problem:  Syncope and collapse Active Problems:  Hypercholesterolemia  CAD (coronary artery disease)  Acute on chronic systolic CHF (congestive heart failure)  Benign hypertension with CKD (chronic kidney disease) stage  III  Diabetes mellitus type 2 with retinopathy  Benign positional vertigo  Pacemaker  Cellulitis of right lower extremity  Unresponsive episode  Hypoglycemia  Relevant past medical, surgical, family and social history reviewed and updated as indicated. Interim medical history since our last visit reviewed. Allergies and medications reviewed and updated. Current Outpatient Prescriptions on File Prior to Visit  Medication Sig  . acetaminophen (TYLENOL) 500 MG tablet Take 500 mg by mouth every 6 (six) hours as needed (pain).   [name] Kitchen amLODipine (NORVASC) 5 MG tablet Take 1 tablet (5 mg total) by mouth daily.  [name] Kitchen aspirin 81 MG chewable tablet Chew 1 tablet (81 mg total) by mouth daily.  . cyanocobalamin (,VITAMIN B-12,) 1000 MCG/ML injection Inject 1 mL (1,000 mcg total) into the muscle every 30 (thirty) days.  [name] Kitchen glucose blood (ONE TOUCH ULTRA TEST) test strip Check four times daily,Dx E11.319  . insulin lispro (HUMALOG) 100 UNIT/ML injection Sliding scale as directed 5-15 units three times daily before meals. (Patient taking differently: Sliding scale as directed 5-15 units three times daily before meals.Sliding scale: 150-200 2 units; 201-250 4 units; 251-300 6 units; 301-350 8 units; >350 10 units; >450 15 units)  . Insulin Pen Needle 31G X 8 MM MISC Use as directed with Lantus solostar. Dx 250.62.  [name] Kitchen Insulin Syringe-Needle U-100 (BD INSULIN SYRINGE ULTRAFINE) 31G X 5/16" 0.5 ML MISC Inject 4x a day  . isosorbide mononitrate (IMDUR) 30 MG 24 hr tablet Take 1 tablet (30 mg total) by mouth daily.  . meclizine (ANTIVERT) 25 MG tablet Take 25 mg by mouth every 6 (six) hours as needed for dizziness.  . nitroGLYCERIN (NITROSTAT) 0.4 MG SL tablet Place 1 tablet (0.4 mg  total) under the tongue every 5 (five) minutes as needed for chest pain. Up to three doses,if pain continues call 911  . rosuvastatin (CRESTOR) 40 MG tablet Take 1 tablet (40 mg total) by mouth daily.  . sertraline (ZOLOFT) 25 MG  tablet Take 1 tablet (25 mg total) by mouth daily.  . tamsulosin (FLOMAX) 0.4 MG CAPS capsule Take 1 capsule (0.4 mg total) by mouth daily.   No current facility-administered medications on file prior to visit.    Review of Systems Per HPI unless specifically indicated above     Objective:    BP 130/66 mmHg  Pulse 71  Temp(Src) 97.9 F (36.6 C) (Oral)  Wt 211 lb 4 oz (95.822 kg)  SpO2 96%  Wt Readings from Last 3 Encounters:  03/19/15 211 lb 4 oz (95.822 kg)  03/10/15 211 lb 12.8 oz (96.072 kg)  03/03/15 213 lb 1.9 oz (96.671 kg)    Physical Exam  Constitutional: He appears well-developed and well-nourished. No distress.  HENT:  Mouth/Throat: Oropharynx is clear and moist. No oropharyngeal exudate.  Eyes: Conjunctivae and EOM are normal. Pupils are equal, round, and reactive to light. No scleral icterus.  Neck: Normal range of motion. Neck supple.  Cardiovascular: Normal rate, regular rhythm, normal heart sounds and intact distal pulses.   No murmur heard. Pulmonary/Chest: Effort normal and breath sounds normal. No respiratory distress. He has no wheezes. He has no rales.  clear  Musculoskeletal: He exhibits edema (R 1+ pitting, L trace).  Chronic R>L pitting edema after R knee replacement  Neurological: He is alert.  Cooperative with exam, answers questions appropriately  Skin: Skin is warm and dry. No rash noted.  Psychiatric: He has a normal mood and affect.  Nursing note and vitals reviewed.  Lab Results  Component Value Date   HGBA1C 8.8* 03/11/2015   Lab Results  Component Value Date   CREATININE 1.45* 03/11/2015       Assessment & Plan:   Problem List Items Addressed This Visit    Urinary incontinence    vesicare in past ineffective. PTNS did not help. Presumed BPH related but also anticipate autonomic dysfunction in h/o CVA with overactive bladder. Will trial myrbetriq - discussed need to monitor bp. UA in hospital was not indicative of infection.       Relevant Medications   mirabegron ER (MYRBETRIQ) 25 MG TB24 tablet   Type 2 diabetes, uncontrolled, with neuropathy    Difficult situation as we want goal A1c <8% given comorbidities, but we also want to avoid even mild relative hypoglycemia given CAD and recent syncope thought precipitated by relative hypoglycemia for him at 80s. Reviewed cbg log they bring. Am sugars adequate in 100s, but PM sugars consistently elevated. For this reason I recommended slow titration of AM lantus to 25U, continue 15U nightly. Continue regular insulin at current sliding scale. Discussed with patient and wife who agree.      Relevant Medications   insulin glargine (LANTUS) 100 UNIT/ML injection   Syncope    With resultant unresponsiveness that led to recent hospital evaluation. Work up negative for acute stroke/TIA, not consistent with seizure, thought related to hypoglycemia despite cbg 80s. Discussed need to avoid CBGs <80 to prevent further syncope/AMS. See above.      Diabetes mellitus type 2 with retinopathy   Relevant Medications   insulin glargine (LANTUS) 100 UNIT/ML injection   CKD stage 3 due to type 2 diabetes mellitus    At baseline by last Cr in  hospital.      Relevant Medications   insulin glargine (LANTUS) 100 UNIT/ML injection   Cellulitis of right lower extremity - Primary    Marked improvement, no residual erythema. Advised wife she can call mail order pharmacy and cancel refill of levaquin. He does have chronic R>L pedal edema (since knee replacement)      Benign hypertension with CKD (chronic kidney disease) stage III    Stable on current regimen - continue.      B12 deficiency    Not due yet for B12.          Follow up plan: Return in about 3 months (around 06/18/2015), or as needed, for follow up visit.

## 2015-03-19 NOTE — Assessment & Plan Note (Signed)
Stable on current regimen - continue.

## 2015-03-19 NOTE — Assessment & Plan Note (Signed)
Marked improvement, no residual erythema. Advised wife she can call mail order pharmacy and cancel refill of levaquin. He does have chronic R>L pedal edema (since knee replacement)

## 2015-03-19 NOTE — Assessment & Plan Note (Addendum)
With resultant unresponsiveness that led to recent hospital evaluation. Work up negative for acute stroke/TIA, not consistent with seizure, thought related to hypoglycemia despite cbg 80s. Discussed need to avoid CBGs <80 to prevent further syncope/AMS. See above.

## 2015-03-19 NOTE — Assessment & Plan Note (Signed)
Difficult situation as we want goal A1c <8% given comorbidities, but we also want to avoid even mild relative hypoglycemia given CAD and recent syncope thought precipitated by relative hypoglycemia for him at 80s. Reviewed cbg log they bring. Am sugars adequate in 100s, but PM sugars consistently elevated. For this reason I recommended slow titration of AM lantus to 25U, continue 15U nightly. Continue regular insulin at current sliding scale. Discussed with patient and wife who agree.

## 2015-03-19 NOTE — Progress Notes (Signed)
Pre visit review using our clinic review tool, if applicable. No additional management support is needed unless otherwise documented below in the visit note.

## 2015-03-19 NOTE — Assessment & Plan Note (Addendum)
vesicare in past ineffective. PTNS did not help. Presumed BPH related but also anticipate autonomic dysfunction in h/o CVA with overactive bladder. Will trial myrbetriq - discussed need to monitor bp. UA in hospital was not indicative of infection.

## 2015-03-19 NOTE — Assessment & Plan Note (Addendum)
Not due yet for B12.

## 2015-03-19 NOTE — Assessment & Plan Note (Signed)
At baseline by last Cr in hospital.

## 2015-03-19 NOTE — Patient Instructions (Addendum)
Change next appointment to 3 months.  Reschedule B12 shot. Continue lantus 15 units at night time. Slowly increase morning lantus by 1 unit every 3 days if afternoon and evening sugars staying elevated >180 to a goal of 25 units lantus in morning.  Price out [employer] (bladder medicine). We don't need any more antibiotic - call and cancel prescription Good to see you today, call us with questions.

## 2015-03-20 ENCOUNTER — Other Ambulatory Visit: Payer: Self-pay

## 2015-03-20 MED ORDER — MIRABEGRON ER 25 MG PO TB24: 25.0000 mg | ORAL_TABLET | Freq: Every day | ORAL | Status: DC

## 2015-03-20 NOTE — Telephone Encounter (Signed)
Mrs Bangs said [facility] said Myrbetriq cost would be $60.00; pt did not get med and Mrs Dutter request sent to [facility]; cost to pt will be $10.00; advised done.

## 2015-04-01 ENCOUNTER — Ambulatory Visit: Payer: Self-pay | Admitting: Family Medicine

## 2015-04-01 DIAGNOSIS — Z85828 Personal history of other malignant neoplasm of skin: Secondary | ICD-10-CM | POA: Diagnosis not present

## 2015-04-01 DIAGNOSIS — D225 Melanocytic nevi of trunk: Secondary | ICD-10-CM | POA: Diagnosis not present

## 2015-04-01 DIAGNOSIS — L821 Other seborrheic keratosis: Secondary | ICD-10-CM | POA: Diagnosis not present

## 2015-04-01 DIAGNOSIS — L570 Actinic keratosis: Secondary | ICD-10-CM | POA: Diagnosis not present

## 2015-04-07 ENCOUNTER — Ambulatory Visit (INDEPENDENT_AMBULATORY_CARE_PROVIDER_SITE_OTHER): Payer: Medicare Other

## 2015-04-07 DIAGNOSIS — E538 Deficiency of other specified B group vitamins: Secondary | ICD-10-CM | POA: Diagnosis not present

## 2015-04-07 MED ORDER — CYANOCOBALAMIN 1000 MCG/ML IJ SOLN
1000.0000 ug | Freq: Once | INTRAMUSCULAR | Status: AC
  Administered 2015-04-07: 1000 ug via INTRAMUSCULAR

## 2015-04-09 ENCOUNTER — Ambulatory Visit (INDEPENDENT_AMBULATORY_CARE_PROVIDER_SITE_OTHER): Payer: Medicare Other | Admitting: Cardiology

## 2015-04-09 ENCOUNTER — Encounter: Payer: Self-pay | Admitting: Cardiology

## 2015-04-09 VITALS — BP 148/62 | HR 63 | Ht 72.0 in | Wt 211.8 lb

## 2015-04-09 DIAGNOSIS — I129 Hypertensive chronic kidney disease with stage 1 through stage 4 chronic kidney disease, or unspecified chronic kidney disease: Secondary | ICD-10-CM | POA: Diagnosis not present

## 2015-04-09 DIAGNOSIS — Z950 Presence of cardiac pacemaker: Secondary | ICD-10-CM | POA: Diagnosis not present

## 2015-04-09 DIAGNOSIS — B0229 Other postherpetic nervous system involvement: Secondary | ICD-10-CM | POA: Diagnosis not present

## 2015-04-09 DIAGNOSIS — N183 Chronic kidney disease, stage 3 unspecified: Secondary | ICD-10-CM

## 2015-04-09 DIAGNOSIS — I259 Chronic ischemic heart disease, unspecified: Secondary | ICD-10-CM

## 2015-04-09 NOTE — Progress Notes (Signed)
Cardiology Office Note   Date:  04/09/2015   ID:  Simpson, Frank 1931-06-05, MRN [id number]  PCP:  [name], MD  Cardiologist: Darlin Coco MD  No chief complaint on file.     History of Present Illness: Frank Simpson is a 81 y.o. male who presents for scheduled follow-up office visit. Frank Simpson is seen back today for a scheduled followup office visit. On 08/06/2012 the patient had a STEMI with DES to the mid LAD. He was treated with dual antiplatelet therapy for more than a year. Presently he is on just aspirin alone.. His other issues include CKD, HTN, DM, HLD, OA, urinary incontinence, glaucoma, melanoma and thrombocytopenia.  The patient was hospitalized in November 2015 for dizzy spells and falls. He was found to have intermittent complete heart block with 8 second pauses. He was seen by Dr. Lovena Le. He underwent placement of a dual-chamber St. Jude pacemaker. Since the pacemaker the patient has not had a further episodes of dizziness or syncope. Since last visit he has been doing well. No recurrent angina. The patient has a history of chronic right-sided chest discomfort secondary to postherpetic neuralgia. He had his episode of shingles about 25 months ago. Since last visit he has not been experiencing any definite recurrent angina pectoris. He is diabetic. He has been having occasional hypoglycemic episodes. He has issues with poor balance. He walks with a cane. He has occasional episodes of vertigo with the room spinning around.  The patient has not been having any symptoms of congestive heart failure.. Recent echocardiogram 03/11/15 showed mild LVH with ejection fraction 50-55% and grade 1 diastolic dysfunction. Since we last saw him he had an episode of syncope and unresponsiveness while at his PCP office.  He was sent to [facility] where he was hospitalized from 03/10/15 until 03/12/15.  His blood sugar was not unusually low and no definite  cause of his syncope was found.  He was monitored.  There were no episodes of bradycardia.  He has a functioning pacemaker. The patient denies any recent chest discomfort.  He has not had to take any sublingual nitroglycerin. He continues to have problems with intermittent vertigo and dizziness.  He still has a tendency toward falling.  He walks with a walker. Still has some intermittent post herpetic neuralgia symptoms.  He never got the shingles shot that we recommended back 6 months ago.  We gave him an updated prescription for shingles shot today.  Past Medical History  Diagnosis Date  . Hypertension   . Diabetes mellitus type 2 with retinopathy 1994    dm edu 2012  . Hyperlipidemia   . Osteoarthritis   . CRI (chronic renal insufficiency)     baseline Cr seems to be 1.7-1.8  . Urinary incontinence     s/p PTNS didn't help  . Glaucoma     and cataracts  . History of melanoma   . CAD (coronary artery disease)     07/2012 acute STEMI, mid LAD PCI - DES; cath 09/2012 patent LAD stent, non-hemodynamically significant Left Main/LAD disease, EF 55%  . Thrombocytopenia   . BCC (basal cell carcinoma of skin) 2015    L neck (Dr. Sherrye Payor), L forearm [facility])  . Complication of anesthesia     confused after cath 10/15/2012  . CVA (cerebral infarction) 09/2012    remote anterior limb of left internal capsule  . Post herpetic neuralgia   . Shingles in March 2014  right chest, across the back  . Syncope 04/02/2013  . Blurred vision   . Dysplastic nevus 2015    L upper back, Lat margin involved ([facility])  . CHF (congestive heart failure)   . Ischemic heart disease   . Benign positional vertigo     Past Surgical History  Procedure Laterality Date  . Replacement total knee  04/2010    RIGHT KNEE  . Tonsillectomy    . Cardiovascular stress test  04/27/2010    EF 75%, nuclear stress test with normal perfusion, no ischemia  . Foot surgery      metal pin in place  . Right shoulder     . Finger surgery      amputated finger  . Lexiscan myoview  10/2011    negative for ischemia  . Cataract extraction  12/12, 1/13    bilateral  . Coronary stent placement  07/2012    DES to mid LAD for STEMI  . Cardiac catheterization  10/15/2012  . Carotid US  03/2013    WNL  . US echocardiography  03/2013    inf/septal hypokinesis, mild LVH, EF 45%, LA mildly dilated  . Penile prosthesis implant    . Left heart catheterization with coronary angiogram N/A 08/06/2012    Procedure: LEFT HEART CATHETERIZATION WITH CORONARY ANGIOGRAM;  Surgeon: Burnell Blanks, MD;  Location: [facility] CATH LAB;  Service: Cardiovascular;  Laterality: N/A;  . Left heart catheterization with coronary angiogram N/A 10/15/2012    Procedure: LEFT HEART CATHETERIZATION WITH CORONARY ANGIOGRAM;  Surgeon: Peter M Martinique, MD;  Location: [facility] CATH LAB;  Service: Cardiovascular;  Laterality: N/A;  . Permanent pacemaker insertion N/A 10/06/2014    Procedure: PERMANENT PACEMAKER INSERTION;  Surgeon: Evans Lance, MD;  Location: [facility] CATH LAB;  Service: Cardiovascular;  Laterality: N/A;  . Carotid US  02/2015    1-39% B carotid stenosis     Current Outpatient Prescriptions  Medication Sig Dispense Refill  . acetaminophen (TYLENOL) 500 MG tablet Take 500 mg by mouth every 6 (six) hours as needed (pain).     [name] Kitchen amLODipine (NORVASC) 5 MG tablet Take 1 tablet (5 mg total) by mouth daily. 90 tablet 2  . aspirin 81 MG chewable tablet Chew 1 tablet (81 mg total) by mouth daily.    . cyanocobalamin (,VITAMIN B-12,) 1000 MCG/ML injection Inject 1 mL (1,000 mcg total) into the muscle every 30 (thirty) days. 1 mL   . glucose blood (ONE TOUCH ULTRA TEST) test strip Check four times daily,Dx E11.319 120 each 5  . insulin glargine (LANTUS) 100 UNIT/ML injection Inject 20 units into the skin in the morning, 15 units at night    . insulin lispro (HUMALOG) 100 UNIT/ML injection Sliding scale as directed 5-15 units three times daily before  meals. (Patient taking differently: Sliding scale as directed 5-15 units three times daily before meals.Sliding scale: 150-200 2 units; 201-250 4 units; 251-300 6 units; 301-350 8 units; >350 10 units; >450 15 units) 10 mL 3  . Insulin Pen Needle 31G X 8 MM MISC Use as directed with Lantus solostar. Dx 250.62. 100 each 5  . Insulin Syringe-Needle U-100 (BD INSULIN SYRINGE ULTRAFINE) 31G X 5/16" 0.5 ML MISC Inject 4x a day 400 each 3  . isosorbide mononitrate (IMDUR) 30 MG 24 hr tablet Take 1 tablet (30 mg total) by mouth daily. 90 tablet 3  . meclizine (ANTIVERT) 25 MG tablet Take 25 mg by mouth every 6 (six) hours as needed for  dizziness.    . mirabegron ER (MYRBETRIQ) 25 MG TB24 tablet Take 1 tablet (25 mg total) by mouth daily. 30 tablet 0  . nitroGLYCERIN (NITROSTAT) 0.4 MG SL tablet Place 1 tablet (0.4 mg total) under the tongue every 5 (five) minutes as needed for chest pain. Up to three doses,if pain continues call 911 25 tablet 3  . rosuvastatin (CRESTOR) 40 MG tablet Take 1 tablet (40 mg total) by mouth daily. 90 tablet 3  . sertraline (ZOLOFT) 25 MG tablet Take 1 tablet (25 mg total) by mouth daily. 90 tablet 3  . tamsulosin (FLOMAX) 0.4 MG CAPS capsule Take 1 capsule (0.4 mg total) by mouth daily. 90 capsule 3   No current facility-administered medications for this visit.    Allergies:   Januvia; Januvia; Metformin and related; Oxycodone; and Penicillins    Social History:  The patient  reports that he has never smoked. He has never used smokeless tobacco. He reports that he does not drink alcohol or use illicit drugs.   Family History:  The patient's family history includes Cancer in his father; Diabetes in his brother and mother; Stroke in his mother.    ROS:  Please see the history of present illness.   Otherwise, review of systems are positive for none.   All other systems are reviewed and negative.    PHYSICAL EXAM: VS:  BP 148/62 mmHg  Pulse 63  Ht 6' (1.829 m)  Wt 211  lb 12.8 oz (96.072 kg)  BMI 28.72 kg/m2 , BMI Body mass index is 28.72 kg/(m^2). GEN: Well nourished, well developed, in no acute distress HEENT: normal Neck: no JVD, carotid bruits, or masses Cardiac: RRR; no murmurs, rubs, or gallops,no edema  Respiratory:  clear to auscultation bilaterally, normal work of breathing GI: soft, nontender, nondistended, + BS MS: no deformity or atrophy Skin: warm and dry, no rash Neuro:  Strength and sensation are intact Psych: euthymic mood, full affect   EKG:  EKG is ordered today. The ekg ordered today demonstrates normal sinus rhythm with first-degree AV block.  Some of the atrial complexes appear to be paced.  No ischemic changes.  There are nonspecific ST-T wave changes.  Since previous tracing of 03/10/15, left anterior hemiblock is no longer seen   Recent Labs: 03/10/2015: Hemoglobin 15.8; Platelets 150 03/11/2015: ALT 14; BUN 17; Creatinine 1.45*; Potassium 3.8; Sodium 140    Lipid Panel    Component Value Date/Time   CHOL 110 03/11/2015 0508   CHOL 139 11/17/2010   TRIG 100 03/11/2015 0508   TRIG 152 11/17/2010   HDL 38* 03/11/2015 0508   CHOLHDL 2.9 03/11/2015 0508   VLDL 20 03/11/2015 0508   LDLCALC 52 03/11/2015 0508   LDLDIRECT 55.0 12/30/2014 0936      Wt Readings from Last 3 Encounters:  04/09/15 211 lb 12.8 oz (96.072 kg)  03/19/15 211 lb 4 oz (95.822 kg)  03/10/15 211 lb 12.8 oz (96.072 kg)      Other studies Reviewed: Additional studies/ records that were reviewed today include: . Review of the above records demonstrates:    ASSESSMENT AND PLAN:  1. Ischemic heart disease status post STEMI with drug-eluting stent to the mid LAD on 08/06/12. Previously on dual antiplatelet therapy. He was switched to aspirin alone after his episodes of frequent falls in November 2015 2. Hypertensive heart disease without heart failure 3. Hypercholesterolemia 4. diabetes mellitus adult onset followed by PCP 5. benign positional  vertigo 6. postherpetic neuralgia 7. chronic  kidney disease 8. Osteoarthritis 9. Intermittent complete heart block with functioning St. Jude dual-chamber pacemaker implanted by Dr. Lovena Le on 10/06/14. 10.  Recent admission for syncope.  No definite cause found.   Current medicines are reviewed at length with the patient today.  The patient does not have concerns regarding medicines.  The following changes have been made:  no change  Labs/ tests ordered today include:   Orders Placed This Encounter  Procedures  . Hepatic function panel  . Lipid panel  . Basic metabolic panel  . EKG 12-Lead     Continue current medication.  Recheck in 6 months for office visit EKG fasting lipid panel hepatic function panel and basal metabolic panel.  We did not have to do any lab work today because he just had lab work in the hospital in April. He will proceed to get the shingles shot  Berna Spare MD 04/09/2015 11:23 AM    [address], [place], [place]  [address] Phone: [phone]; Fax: [phone]

## 2015-04-09 NOTE — Patient Instructions (Signed)
Medication Instructions:  Your physician recommends that you continue on your current medications as directed. Please refer to the Current Medication list given to you today.  Labwork: NONE  Testing/Procedures: NONE  Follow-Up: Your physician wants you to follow-up in: 6 months with fasting labs (lp/bmet/hfp) AND EKG  You will receive a reminder letter in the mail two months in advance. If you don't receive a letter, please call our office to schedule the follow-up appointment.

## 2015-04-13 ENCOUNTER — Ambulatory Visit (INDEPENDENT_AMBULATORY_CARE_PROVIDER_SITE_OTHER): Payer: Medicare Other | Admitting: *Deleted

## 2015-04-13 ENCOUNTER — Encounter: Payer: Self-pay | Admitting: Internal Medicine

## 2015-04-13 ENCOUNTER — Telehealth: Payer: Self-pay | Admitting: Internal Medicine

## 2015-04-13 DIAGNOSIS — R001 Bradycardia, unspecified: Secondary | ICD-10-CM

## 2015-04-13 NOTE — Telephone Encounter (Signed)
New Message   Pt wife calling to see if remote transmission was sent successfully. Please call back and dsicuss.

## 2015-04-13 NOTE — Progress Notes (Signed)
Remote pacemaker transmission.

## 2015-04-13 NOTE — Telephone Encounter (Signed)
Informed pt wife that transmission was received.

## 2015-04-15 LAB — CUP PACEART REMOTE DEVICE CHECK
Battery Remaining Longevity: 122 mo
Battery Remaining Percentage: 95.5 %
Date Time Interrogation Session: 20160516060015
Lead Channel Impedance Value: 480 Ohm
Lead Channel Pacing Threshold Amplitude: 1 V
Lead Channel Pacing Threshold Pulse Width: 0.4 ms
Lead Channel Sensing Intrinsic Amplitude: 12 mV
Lead Channel Sensing Intrinsic Amplitude: 4.1 mV
Lead Channel Setting Pacing Amplitude: 2.5 V
Lead Channel Setting Pacing Pulse Width: 0.4 ms
Lead Channel Setting Sensing Sensitivity: 2 mV
MDC IDC MSMT BATTERY VOLTAGE: 3.04 V
MDC IDC MSMT LEADCHNL RA IMPEDANCE VALUE: 450 Ohm
MDC IDC MSMT LEADCHNL RA PACING THRESHOLD PULSEWIDTH: 0.4 ms
MDC IDC MSMT LEADCHNL RV PACING THRESHOLD AMPLITUDE: 1 V
MDC IDC SET LEADCHNL RA PACING AMPLITUDE: 2 V
MDC IDC STAT BRADY AP VP PERCENT: 1 %
MDC IDC STAT BRADY AP VS PERCENT: 36 %
MDC IDC STAT BRADY AS VP PERCENT: 1.1 %
MDC IDC STAT BRADY AS VS PERCENT: 62 %
MDC IDC STAT BRADY RA PERCENT PACED: 35 %
MDC IDC STAT BRADY RV PERCENT PACED: 2.1 %
Pulse Gen Model: 2240
Pulse Gen Serial Number: 7666171

## 2015-04-20 ENCOUNTER — Other Ambulatory Visit: Payer: Self-pay | Admitting: Family Medicine

## 2015-04-29 ENCOUNTER — Encounter: Payer: Self-pay | Admitting: Cardiology

## 2015-05-12 ENCOUNTER — Other Ambulatory Visit: Payer: Self-pay

## 2015-05-12 ENCOUNTER — Ambulatory Visit (INDEPENDENT_AMBULATORY_CARE_PROVIDER_SITE_OTHER): Payer: Medicare Other

## 2015-05-12 DIAGNOSIS — E538 Deficiency of other specified B group vitamins: Secondary | ICD-10-CM | POA: Diagnosis not present

## 2015-05-12 MED ORDER — CYANOCOBALAMIN 1000 MCG/ML IJ SOLN
1000.0000 ug | Freq: Once | INTRAMUSCULAR | Status: AC
  Administered 2015-05-12: 1000 ug via INTRAMUSCULAR

## 2015-05-12 NOTE — Telephone Encounter (Signed)
Pt has been taking myrbetriq since 03/20/15 and cannot tell a lot of difference in urinary incontinence but Mrs Dupuis said when she read material about myrbetriq pt should take med 60 days to see if effective. Pt request cb.

## 2015-05-13 MED ORDER — MIRABEGRON ER 25 MG PO TB24: 25.0000 mg | ORAL_TABLET | Freq: Every day | ORAL | Status: DC

## 2015-05-13 NOTE — Telephone Encounter (Signed)
Patient's wife notified and requested 90 day supply to mail order since it's the same cost. Refilled as requested.

## 2015-05-13 NOTE — Telephone Encounter (Signed)
Let's try another month. Then update [place] with effect. See where she wants this sent - 1 month supply with 3 refills to local pharmacy or 90d supply with no refills to mail order.

## 2015-05-19 NOTE — Patient Outreach (Signed)
[facility]) Care Management  05/19/2015  Frank Simpson November 15, 1931 [id number]   Referral from [place] List, assigned Maury Dus, RN to outreach patient.  [name]. [place], [place] Management [place] Assistant Phone: [phone] Fax: [phone]

## 2015-05-28 ENCOUNTER — Other Ambulatory Visit: Payer: Self-pay

## 2015-05-28 NOTE — Patient Outreach (Signed)
[facility] Care Management  05/28/2015  Frank Simpson [birth year] [id number]   RN CM attempted to reach patient to discuss [facility] services.  Patient was unavailable at the time.  RN CM will try back at a later date.  Maury Dus, RN, Ishmael Holter, [place] Telephonic Care Coordinator [phone]

## 2015-06-02 ENCOUNTER — Encounter: Payer: Self-pay | Admitting: Family Medicine

## 2015-06-02 ENCOUNTER — Other Ambulatory Visit: Payer: Self-pay

## 2015-06-02 NOTE — Patient Outreach (Signed)
[facility]) Care Management  06/02/2015  Frank Simpson 14-Jan-1931 [id number]  Patient unavailable at time of call and patient's wife asked that RN CM call back in 45 minutes.  RN CM called back as agreed and spoke with patient.  RN CM explained the services of [facility].  Patient decline the services.  States he does not think this service would be of a benefit to him.  RN CM will close this case and notify primary physician of patient's decision.

## 2015-06-11 ENCOUNTER — Ambulatory Visit (INDEPENDENT_AMBULATORY_CARE_PROVIDER_SITE_OTHER)
Admission: RE | Admit: 2015-06-11 | Discharge: 2015-06-11 | Disposition: A | Discharge status: Home / Self Care | Payer: Medicare Other | Source: Ambulatory Visit | Attending: Family Medicine | Admitting: Family Medicine

## 2015-06-11 ENCOUNTER — Encounter: Payer: Self-pay | Admitting: Family Medicine

## 2015-06-11 ENCOUNTER — Ambulatory Visit (INDEPENDENT_AMBULATORY_CARE_PROVIDER_SITE_OTHER): Payer: Medicare Other | Admitting: Family Medicine

## 2015-06-11 VITALS — BP 140/68 | HR 61 | Temp 98.3°F | Wt 211.0 lb

## 2015-06-11 DIAGNOSIS — E7800 Pure hypercholesterolemia, unspecified: Secondary | ICD-10-CM

## 2015-06-11 DIAGNOSIS — E538 Deficiency of other specified B group vitamins: Secondary | ICD-10-CM

## 2015-06-11 DIAGNOSIS — B351 Tinea unguium: Secondary | ICD-10-CM | POA: Insufficient documentation

## 2015-06-11 DIAGNOSIS — E1165 Type 2 diabetes mellitus with hyperglycemia: Secondary | ICD-10-CM

## 2015-06-11 DIAGNOSIS — I259 Chronic ischemic heart disease, unspecified: Secondary | ICD-10-CM

## 2015-06-11 DIAGNOSIS — M1990 Unspecified osteoarthritis, unspecified site: Secondary | ICD-10-CM

## 2015-06-11 DIAGNOSIS — E1140 Type 2 diabetes mellitus with diabetic neuropathy, unspecified: Secondary | ICD-10-CM

## 2015-06-11 DIAGNOSIS — S99922A Unspecified injury of left foot, initial encounter: Secondary | ICD-10-CM

## 2015-06-11 DIAGNOSIS — E1122 Type 2 diabetes mellitus with diabetic chronic kidney disease: Secondary | ICD-10-CM | POA: Diagnosis not present

## 2015-06-11 DIAGNOSIS — E11319 Type 2 diabetes mellitus with unspecified diabetic retinopathy without macular edema: Secondary | ICD-10-CM

## 2015-06-11 DIAGNOSIS — IMO0002 Reserved for concepts with insufficient information to code with codable children: Secondary | ICD-10-CM

## 2015-06-11 DIAGNOSIS — N183 Chronic kidney disease, stage 3 unspecified: Secondary | ICD-10-CM

## 2015-06-11 LAB — MICROALBUMIN / CREATININE URINE RATIO
Creatinine,U: 428.5 mg/dL
MICROALB UR: 73.5 mg/dL — AB (ref 0.0–1.9)
Microalb Creat Ratio: 17.2 mg/g (ref 0.0–30.0)

## 2015-06-11 LAB — RENAL FUNCTION PANEL
ALBUMIN: 3.8 g/dL (ref 3.5–5.2)
BUN: 28 mg/dL — AB (ref 6–23)
CHLORIDE: 105 meq/L (ref 96–112)
CO2: 28 meq/L (ref 19–32)
CREATININE: 2.11 mg/dL — AB (ref 0.40–1.50)
Calcium: 9.3 mg/dL (ref 8.4–10.5)
GFR: 32.08 mL/min — ABNORMAL LOW (ref >60.00)
Glucose, Bld: 225 mg/dL — ABNORMAL HIGH (ref 70–99)
POTASSIUM: 4.4 meq/L (ref 3.5–5.1)
Phosphorus: 3.3 mg/dL (ref 2.3–4.6)
Sodium: 139 mEq/L (ref 135–145)

## 2015-06-11 LAB — HEMOGLOBIN A1C: HEMOGLOBIN A1C: 9.4 % — AB (ref 4.6–6.5)

## 2015-06-11 MED ORDER — CYANOCOBALAMIN 1000 MCG/ML IJ SOLN
1000.0000 ug | Freq: Once | INTRAMUSCULAR | Status: AC
  Administered 2015-06-11: 1000 ug via INTRAMUSCULAR

## 2015-06-11 MED ORDER — INSULIN GLARGINE 100 UNIT/ML ~~LOC~~ SOLN: SUBCUTANEOUS | Status: DC

## 2015-06-11 NOTE — Assessment & Plan Note (Signed)
B12 shot today.

## 2015-06-11 NOTE — Assessment & Plan Note (Signed)
Check xray today to r/o digit fracture. Discussed using hard soled shoes for support, epsom salt baths.

## 2015-06-11 NOTE — Progress Notes (Signed)
BP 140/68 mmHg  Pulse 61  Temp(Src) 98.3 F (36.8 C) (Oral)  Wt 211 lb (95.709 kg)   CC: f/u visit  Subjective:    Patient ID: Frank Simpson, male    DOB: 1931-09-07, 81 y.o.   MRN: [id number]  HPI: KENZO OZMENT is a 81 y.o. male presenting on 06/11/2015 for Follow-up  Declined [facility] care management services.  Dropped car jack on left toes yesterday. Now 2nd/3rd toes all bruised. Pain controlled with rare hydrocodone. Has been using epsom salt. Able to walk regularly.  DM - forgot sugar log today. Regularly does check sugars three times daily. This morning fasting cbg 164. Compliant with antihyperglycemic regimen which includes: lantus 25u am and 15 at night. Also on humalog SSI. Denies low sugars or hypoglycemic symptoms. lowest was 70. Denies paresthesias. Last diabetic eye exam DUE.  Pneumovax: 11/2008.  Prevnar: 06/2014. Previously followed by endo Dr Cruzita Lederer but pt decided not to return.  Lab Results  Component Value Date   HGBA1C 8.8* 03/11/2015   Diabetic Foot Exam - Simple   Simple Foot Form  Visual Inspection  No deformities, no ulcerations, no other skin breakdown bilaterally:  Yes  Sensation Testing  See comments:  Yes  Pulse Check  Posterior Tibialis and Dorsalis pulse intact bilaterally:  Yes  Comments  Diminished sensation to monofilament testing of R foot     HLD - compliant with crestor 86m daily.  Tolerating sertraline 266mdaily.   Relevant past medical, surgical, family and social history reviewed and updated as indicated. Interim medical history since our last visit reviewed. Allergies and medications reviewed and updated. Current Outpatient Prescriptions on File Prior to Visit  Medication Sig  . acetaminophen (TYLENOL) 500 MG tablet Take 500 mg by mouth every 6 (six) hours as needed (pain).   . [name] KitchenmLODipine (NORVASC) 5 MG tablet Take 1 tablet (5 mg total) by mouth daily.  . [name] Kitchenspirin 81 MG chewable tablet Chew 1 tablet (81 mg total) by mouth daily.  .  cyanocobalamin (,VITAMIN B-12,) 1000 MCG/ML injection Inject 1 mL (1,000 mcg total) into the muscle every 30 (thirty) days.  . insulin lispro (HUMALOG) 100 UNIT/ML injection Sliding scale as directed 5-15 units three times daily before meals. (Patient taking differently: Sliding scale as directed 5-15 units three times daily before meals.Sliding scale: 150-200 2 units; 201-250 4 units; 251-300 6 units; 301-350 8 units; >350 10 units; >450 15 units)  . Insulin Pen Needle 31G X 8 MM MISC Use as directed with Lantus solostar. Dx 250.62.  . [name] Kitchennsulin Syringe-Needle U-100 (BD INSULIN SYRINGE ULTRAFINE) 31G X 5/16" 0.5 ML MISC Inject 4x a day  . isosorbide mononitrate (IMDUR) 30 MG 24 hr tablet Take 1 tablet (30 mg total) by mouth daily.  . meclizine (ANTIVERT) 25 MG tablet Take 25 mg by mouth every 6 (six) hours as needed for dizziness.  . mirabegron ER (MYRBETRIQ) 25 MG TB24 tablet Take 1 tablet (25 mg total) by mouth daily.  . nitroGLYCERIN (NITROSTAT) 0.4 MG SL tablet Place 1 tablet (0.4 mg total) under the tongue every 5 (five) minutes as needed for chest pain. Up to three doses,if pain continues call 911  . ONE TOUCH ULTRA TEST test strip CHECK FOUR TIMES DAILY,DX E11.319  . rosuvastatin (CRESTOR) 40 MG tablet Take 1 tablet (40 mg total) by mouth daily.  . sertraline (ZOLOFT) 25 MG tablet Take 1 tablet (25 mg total) by mouth daily.  . tamsulosin (FLOMAX) 0.4 MG CAPS capsule Take  1 capsule (0.4 mg total) by mouth daily.   No current facility-administered medications on file prior to visit.    Review of Systems Per HPI unless specifically indicated above     Objective:    BP 140/68 mmHg  Pulse 61  Temp(Src) 98.3 F (36.8 C) (Oral)  Wt 211 lb (95.709 kg)  Wt Readings from Last 3 Encounters:  06/11/15 211 lb (95.709 kg)  04/09/15 211 lb 12.8 oz (96.072 kg)  03/19/15 211 lb 4 oz (95.822 kg)    Physical Exam  Constitutional: He appears well-developed and well-nourished. No distress.  HENT:    Head: Normocephalic and atraumatic.  Right Ear: External ear normal.  Left Ear: External ear normal.  Nose: Nose normal.  Mouth/Throat: Oropharynx is clear and moist. No oropharyngeal exudate.  Eyes: Conjunctivae and EOM are normal. Pupils are equal, round, and reactive to light. No scleral icterus.  Neck: Normal range of motion. Neck supple.  Cardiovascular: Normal rate, regular rhythm, normal heart sounds and intact distal pulses.   No murmur heard. Pulmonary/Chest: Effort normal and breath sounds normal. No respiratory distress. He has no wheezes. He has no rales.  Musculoskeletal: He exhibits no edema.  See HPI for foot exam if done L foot with bruising 2nd and 3rd MT, blisters have formed left 2nd toe. No pain at MTs, no pain with axial loading of toes  Lymphadenopathy:    He has no cervical adenopathy.  Skin: Skin is warm and dry. No rash noted.  Psychiatric: He has a normal mood and affect.  Nursing note and vitals reviewed.      Assessment & Plan:   Problem List Items Addressed This Visit    B12 deficiency    B12 shot today.      CKD stage 3 due to type 2 diabetes mellitus    Check renal panel today. Baseline Cr seems to be 1.5      Relevant Medications   insulin glargine (LANTUS) 100 UNIT/ML injection   Diabetes mellitus type 2 with retinopathy    Rec schedule eye exam as due      Relevant Medications   insulin glargine (LANTUS) 100 UNIT/ML injection   Hypercholesterolemia   Osteoarthritis    Uses walker to walk. Permanent handicap placard application filled out today.      Toe injury - Primary    Check xray today to r/o digit fracture. Discussed using hard soled shoes for support, epsom salt baths.      Relevant Orders   DG Foot Complete Left   Type 2 diabetes, uncontrolled, with neuropathy    Endorses better control at home recently with increased AM lantus to 25u. Check labs today. No true lows recently, but has had down to 70. Want to keep sugar  level >80 ideally given h/o falls from hypoglycemia.      Relevant Medications   insulin glargine (LANTUS) 100 UNIT/ML injection   Other Relevant Orders   Renal function panel   Hemoglobin A1c   Microalbumin / creatinine urine ratio       Follow up plan: Return in about 3 months (around 09/11/2015), or as needed, for follow up visit.

## 2015-06-11 NOTE — Assessment & Plan Note (Signed)
Endorses better control at home recently with increased AM lantus to 25u. Check labs today. No true lows recently, but has had down to 70. Want to keep sugar level >80 ideally given h/o falls from hypoglycemia.

## 2015-06-11 NOTE — Addendum Note (Signed)
Addended byCloyd Stagers B on: 06/11/2015 10:56 AM   Modules accepted: Orders

## 2015-06-11 NOTE — Assessment & Plan Note (Signed)
Rec schedule eye exam as due

## 2015-06-11 NOTE — Assessment & Plan Note (Signed)
Check renal panel today. Baseline Cr seems to be 1.5

## 2015-06-11 NOTE — Progress Notes (Signed)
Pre visit review using our clinic review tool, if applicable. No additional management support is needed unless otherwise documented below in the visit note.

## 2015-06-11 NOTE — Assessment & Plan Note (Signed)
Uses walker to walk. Permanent handicap placard application filled out today.

## 2015-06-11 NOTE — Patient Instructions (Addendum)
Xray of left foot today. labwork today. Return in 3-4 months for follow up Good to see you today, call us with questions.

## 2015-06-12 ENCOUNTER — Other Ambulatory Visit: Payer: Self-pay | Admitting: *Deleted

## 2015-07-08 ENCOUNTER — Other Ambulatory Visit: Payer: Self-pay | Admitting: *Deleted

## 2015-07-08 MED ORDER — INSULIN GLARGINE 100 UNIT/ML ~~LOC~~ SOLN: SUBCUTANEOUS | Status: DC

## 2015-07-13 ENCOUNTER — Telehealth: Payer: Self-pay | Admitting: Cardiology

## 2015-07-13 ENCOUNTER — Encounter: Payer: Medicare Other | Admitting: *Deleted

## 2015-07-13 NOTE — Telephone Encounter (Signed)
Confirmed remote transmission w/ pt wife.

## 2015-07-14 ENCOUNTER — Ambulatory Visit (INDEPENDENT_AMBULATORY_CARE_PROVIDER_SITE_OTHER): Payer: Medicare Other

## 2015-07-14 ENCOUNTER — Encounter: Payer: Self-pay | Admitting: Cardiology

## 2015-07-14 DIAGNOSIS — E538 Deficiency of other specified B group vitamins: Secondary | ICD-10-CM | POA: Diagnosis not present

## 2015-07-14 MED ORDER — CYANOCOBALAMIN 1000 MCG/ML IJ SOLN
1000.0000 ug | Freq: Once | INTRAMUSCULAR | Status: AC
  Administered 2015-07-14: 1000 ug via INTRAMUSCULAR

## 2015-07-18 ENCOUNTER — Telehealth: Payer: Self-pay | Admitting: Family Medicine

## 2015-07-18 NOTE — Telephone Encounter (Signed)
reviewed sugar log pt dropped off -  Fasting 88-230, lunch 80-300, dinner 300-400s lantus 25u am, 15u pm + SSI.  Lab Results  Component Value Date   HGBA1C 9.4* 06/11/2015   Thank you for keeping this log. Let's slowly increase AM lantus by 1 unit Q3 days to goal 30 u in am if 3d average PM sugar >200. Continue 15u PM. Continue SSI.  Bring another log in 2-4 wks Let us know if any questions

## 2015-07-20 ENCOUNTER — Telehealth: Payer: Self-pay | Admitting: Cardiology

## 2015-07-20 ENCOUNTER — Ambulatory Visit (INDEPENDENT_AMBULATORY_CARE_PROVIDER_SITE_OTHER): Payer: Medicare Other | Admitting: *Deleted

## 2015-07-20 DIAGNOSIS — R001 Bradycardia, unspecified: Secondary | ICD-10-CM | POA: Diagnosis not present

## 2015-07-20 NOTE — Telephone Encounter (Signed)
New message     Pt wife calling in regards to letter received about not receiving transmission Please call to discuss

## 2015-07-20 NOTE — Telephone Encounter (Signed)
Patient notified as instructed by telephone and verbalized understanding.

## 2015-07-20 NOTE — Telephone Encounter (Signed)
Patient's wife called, explained to her how to transmit remotely.  Patient's wife voiced understanding and denied additional questions or concerns.

## 2015-07-21 NOTE — Progress Notes (Signed)
Remote pacemaker transmission.

## 2015-07-24 LAB — CUP PACEART REMOTE DEVICE CHECK
Battery Remaining Longevity: 118 mo
Battery Remaining Percentage: 95.5 %
Brady Statistic AP VP Percent: 1 %
Brady Statistic AP VS Percent: 35 %
Brady Statistic AS VP Percent: 1.2 %
Brady Statistic AS VS Percent: 63 %
Lead Channel Impedance Value: 450 Ohm
Lead Channel Impedance Value: 480 Ohm
Lead Channel Pacing Threshold Amplitude: 1 V
Lead Channel Pacing Threshold Pulse Width: 0.4 ms
Lead Channel Sensing Intrinsic Amplitude: 12 mV
Lead Channel Sensing Intrinsic Amplitude: 4.5 mV
Lead Channel Setting Pacing Amplitude: 2 V
Lead Channel Setting Pacing Amplitude: 2.5 V
Lead Channel Setting Pacing Pulse Width: 0.4 ms
MDC IDC MSMT BATTERY VOLTAGE: 3.04 V
MDC IDC MSMT LEADCHNL RA PACING THRESHOLD AMPLITUDE: 1 V
MDC IDC MSMT LEADCHNL RA PACING THRESHOLD PULSEWIDTH: 0.4 ms
MDC IDC PG SERIAL: 7666171
MDC IDC SESS DTM: 20160822161457
MDC IDC SET LEADCHNL RV SENSING SENSITIVITY: 2 mV
MDC IDC STAT BRADY RA PERCENT PACED: 34 %
MDC IDC STAT BRADY RV PERCENT PACED: 2.2 %

## 2015-07-30 ENCOUNTER — Encounter: Payer: Self-pay | Admitting: Cardiology

## 2015-08-12 ENCOUNTER — Encounter: Payer: Self-pay | Admitting: Internal Medicine

## 2015-08-18 ENCOUNTER — Ambulatory Visit (INDEPENDENT_AMBULATORY_CARE_PROVIDER_SITE_OTHER): Payer: Medicare Other

## 2015-08-18 DIAGNOSIS — E538 Deficiency of other specified B group vitamins: Secondary | ICD-10-CM

## 2015-08-18 MED ORDER — CYANOCOBALAMIN 1000 MCG/ML IJ SOLN
1000.0000 ug | Freq: Once | INTRAMUSCULAR | Status: AC
  Administered 2015-08-18: 1000 ug via INTRAMUSCULAR

## 2015-08-18 NOTE — Telephone Encounter (Signed)
In your IN box for review.

## 2015-08-18 NOTE — Telephone Encounter (Signed)
Pts spouse dropped off new insulin log today. Log on Kim's desk. Thank you

## 2015-08-18 NOTE — Telephone Encounter (Addendum)
Reviewed sugar log pt dropped off -  Fasting 90-200, lunch 75-400, dinner 200-400 No low sugars. On lantus 30 u AM, 15u PM + SSI. No noted improvement with higher lantus dosing.  Left message on home # to touch base re sugars.

## 2015-08-20 DIAGNOSIS — S8012XA Contusion of left lower leg, initial encounter: Secondary | ICD-10-CM | POA: Diagnosis not present

## 2015-08-28 MED ORDER — INSULIN LISPRO 100 UNIT/ML ~~LOC~~ SOLN: SUBCUTANEOUS | Status: DC

## 2015-08-28 MED ORDER — INSULIN GLARGINE 100 UNIT/ML ~~LOC~~ SOLN: 35.0000 [IU] | Freq: Every day | SUBCUTANEOUS | Status: DC

## 2015-08-28 NOTE — Telephone Encounter (Signed)
Spoke with wife - rec change lantus to 35u once daily in am.  Change humalog to 5 units with meals + sliding scale as per med list.  Bring log to next visit in 2 wks. Update me sooner if any low sugars.

## 2015-08-28 NOTE — Addendum Note (Signed)
Addended by: [name] on: 08/28/2015 11:04 AM   Modules accepted: Orders, Medications

## 2015-08-31 ENCOUNTER — Other Ambulatory Visit: Payer: Self-pay | Admitting: *Deleted

## 2015-08-31 DIAGNOSIS — S025XXB Fracture of tooth (traumatic), initial encounter for open fracture: Secondary | ICD-10-CM | POA: Diagnosis not present

## 2015-08-31 MED ORDER — INSULIN LISPRO 100 UNIT/ML ~~LOC~~ SOLN: SUBCUTANEOUS | Status: DC

## 2015-09-03 ENCOUNTER — Encounter: Payer: Self-pay | Admitting: Nurse Practitioner

## 2015-09-04 ENCOUNTER — Encounter: Payer: Self-pay | Admitting: Internal Medicine

## 2015-09-11 ENCOUNTER — Ambulatory Visit: Payer: Self-pay | Admitting: Family Medicine

## 2015-09-14 ENCOUNTER — Ambulatory Visit (INDEPENDENT_AMBULATORY_CARE_PROVIDER_SITE_OTHER): Payer: Medicare Other | Admitting: Family Medicine

## 2015-09-14 ENCOUNTER — Encounter: Payer: Self-pay | Admitting: Family Medicine

## 2015-09-14 VITALS — BP 132/74 | HR 72 | Temp 97.6°F | Wt 211.5 lb

## 2015-09-14 DIAGNOSIS — E1122 Type 2 diabetes mellitus with diabetic chronic kidney disease: Secondary | ICD-10-CM

## 2015-09-14 DIAGNOSIS — Z23 Encounter for immunization: Secondary | ICD-10-CM | POA: Diagnosis not present

## 2015-09-14 DIAGNOSIS — I259 Chronic ischemic heart disease, unspecified: Secondary | ICD-10-CM

## 2015-09-14 DIAGNOSIS — Z794 Long term (current) use of insulin: Secondary | ICD-10-CM

## 2015-09-14 DIAGNOSIS — E1165 Type 2 diabetes mellitus with hyperglycemia: Secondary | ICD-10-CM

## 2015-09-14 DIAGNOSIS — E1140 Type 2 diabetes mellitus with diabetic neuropathy, unspecified: Secondary | ICD-10-CM

## 2015-09-14 DIAGNOSIS — E11319 Type 2 diabetes mellitus with unspecified diabetic retinopathy without macular edema: Secondary | ICD-10-CM | POA: Diagnosis not present

## 2015-09-14 DIAGNOSIS — N183 Chronic kidney disease, stage 3 (moderate): Secondary | ICD-10-CM

## 2015-09-14 DIAGNOSIS — IMO0002 Reserved for concepts with insufficient information to code with codable children: Secondary | ICD-10-CM

## 2015-09-14 LAB — RENAL FUNCTION PANEL
Albumin: 3.6 g/dL (ref 3.5–5.2)
BUN: 28 mg/dL — ABNORMAL HIGH (ref 6–23)
CO2: 28 meq/L (ref 19–32)
Calcium: 9.3 mg/dL (ref 8.4–10.5)
Chloride: 105 mEq/L (ref 96–112)
Creatinine, Ser: 1.82 mg/dL — ABNORMAL HIGH (ref 0.40–1.50)
GFR: 38.02 mL/min — ABNORMAL LOW (ref >60.00)
Glucose, Bld: 155 mg/dL — ABNORMAL HIGH (ref 70–99)
PHOSPHORUS: 2.9 mg/dL (ref 2.3–4.6)
POTASSIUM: 4.3 meq/L (ref 3.5–5.1)
SODIUM: 140 meq/L (ref 135–145)

## 2015-09-14 LAB — HEMOGLOBIN A1C: HEMOGLOBIN A1C: 8.6 % — AB (ref 4.6–6.5)

## 2015-09-14 MED ORDER — INSULIN GLARGINE 100 UNIT/ML SOLOSTAR PEN: 35.0000 [IU] | PEN_INJECTOR | Freq: Every day | SUBCUTANEOUS | Status: DC

## 2015-09-14 MED ORDER — INSULIN LISPRO 100 UNIT/ML (KWIKPEN): PEN_INJECTOR | SUBCUTANEOUS | Status: DC

## 2015-09-14 NOTE — Progress Notes (Signed)
BP 132/74 mmHg  Pulse 72  Temp(Src) 97.6 F (36.4 C) (Oral)  Wt 211 lb 8 oz (95.936 kg)   CC: 3 mo f/u visit  Subjective:    Patient ID: Frank Simpson, male    DOB: [birth year], 81 y.o.   MRN: [id number]  HPI: Frank Simpson is a 81 y.o. male presenting on 09/14/2015 for Follow-up   See recent phone notes for details.  DM - regularly does check sugars and brings log.  Compliant with antihyperglycemic regimen which includes: lantus 35u daily in am, humalog 5u with meals + sliding scale (2 units for every 50 over 100 starting at 150). Pen works better - more exact measures, requests 90d supply. Denies low sugars or hypoglycemic symptoms.  Denies paresthesias. Last diabetic eye exam 01/2014 - retinopathy.  Pneumovax: 2010.  Prevnar: 2015. Lab Results  Component Value Date   HGBA1C 9.4* 06/11/2015   Diabetic Foot Exam - Simple   Simple Foot Form  Diabetic Foot exam was performed with the following findings:  Yes 09/14/2015 11:13 AM  Visual Inspection  No deformities, no ulcerations, no other skin breakdown bilaterally:  Yes  Sensation Testing  Intact to touch and monofilament testing bilaterally:  Yes  Pulse Check  Posterior Tibialis and Dorsalis pulse intact bilaterally:  Yes  Comments  Thickened nails, diminished sensation to monofilament      Sugar log - fasting 90-250, prelunch 120-270, predinner 150-400. Sometimes forgets to check sugars prior to meal so checks right after. Advised to wait 2 hrs. Small breakfast, larger lunch and dinner, mid meal snacks as well (peanuts).  Had a fall 3 wks ago while bending over to put air in tire. Hit face and broke teeth, saw dentist.   Relevant past medical, surgical, family and social history reviewed and updated as indicated. Interim medical history since our last visit reviewed. Allergies and medications reviewed and updated. Current Outpatient Prescriptions on File Prior to Visit  Medication Sig  . acetaminophen (TYLENOL) 500 MG  tablet Take 500 mg by mouth every 6 (six) hours as needed (pain).   [name] Kitchen amLODipine (NORVASC) 5 MG tablet Take 1 tablet (5 mg total) by mouth daily.  [name] Kitchen aspirin 81 MG chewable tablet Chew 1 tablet (81 mg total) by mouth daily.  . cyanocobalamin (,VITAMIN B-12,) 1000 MCG/ML injection Inject 1 mL (1,000 mcg total) into the muscle every 30 (thirty) days.  . Insulin Pen Needle 31G X 8 MM MISC Use as directed with Lantus solostar. Dx 250.62.  [name] Kitchen Insulin Syringe-Needle U-100 (BD INSULIN SYRINGE ULTRAFINE) 31G X 5/16" 0.5 ML MISC Inject 4x a day  . isosorbide mononitrate (IMDUR) 30 MG 24 hr tablet Take 1 tablet (30 mg total) by mouth daily.  . meclizine (ANTIVERT) 25 MG tablet Take 25 mg by mouth every 6 (six) hours as needed for dizziness.  . nitroGLYCERIN (NITROSTAT) 0.4 MG SL tablet Place 1 tablet (0.4 mg total) under the tongue every 5 (five) minutes as needed for chest pain. Up to three doses,if pain continues call 911  . ONE TOUCH ULTRA TEST test strip CHECK FOUR TIMES DAILY,DX E11.319  . rosuvastatin (CRESTOR) 40 MG tablet Take 1 tablet (40 mg total) by mouth daily.  . sertraline (ZOLOFT) 25 MG tablet Take 1 tablet (25 mg total) by mouth daily.  . tamsulosin (FLOMAX) 0.4 MG CAPS capsule Take 1 capsule (0.4 mg total) by mouth daily.  . mirabegron ER (MYRBETRIQ) 25 MG TB24 tablet Take 1 tablet (25 mg total) by  mouth daily. (Patient not taking: Reported on 09/14/2015)   No current facility-administered medications on file prior to visit.    Review of Systems Per HPI unless specifically indicated above     Objective:    BP 132/74 mmHg  Pulse 72  Temp(Src) 97.6 F (36.4 C) (Oral)  Wt 211 lb 8 oz (95.936 kg)  Wt Readings from Last 3 Encounters:  09/14/15 211 lb 8 oz (95.936 kg)  06/11/15 211 lb (95.709 kg)  04/09/15 211 lb 12.8 oz (96.072 kg)    Physical Exam  Constitutional: He appears well-developed and well-nourished. No distress.  HENT:  Head: Normocephalic and atraumatic.  Right Ear:  External ear normal.  Left Ear: External ear normal.  Nose: Nose normal.  Mouth/Throat: Oropharynx is clear and moist. No oropharyngeal exudate.  Eyes: Conjunctivae and EOM are normal. Pupils are equal, round, and reactive to light. No scleral icterus.  Neck: Normal range of motion. Neck supple.  Cardiovascular: Normal rate, normal heart sounds and intact distal pulses.  An irregular rhythm present.  No murmur heard. Pulmonary/Chest: Effort normal and breath sounds normal. No respiratory distress. He has no wheezes. He has no rales.  Musculoskeletal: He exhibits edema (1+ pitting bilaterally).  See HPI for foot exam if done  Lymphadenopathy:    He has no cervical adenopathy.  Skin: Skin is warm and dry. No rash noted.  Psychiatric: He has a normal mood and affect.  Nursing note and vitals reviewed.      Assessment & Plan:   Problem List Items Addressed This Visit    Type 2 diabetes, uncontrolled, with neuropathy ([place]) - Primary    Mild neuropathy. Reviewed sugar log. Discussed insulin dosing and meals. Change insulin regimen as per instructions. Check labs today.      Relevant Medications   Insulin Glargine (LANTUS SOLOSTAR) 100 UNIT/ML Solostar Pen   insulin lispro (HUMALOG) 100 UNIT/ML KiwkPen   Other Relevant Orders   Renal function panel   Hemoglobin A1c   Diabetes mellitus type 2 with retinopathy ([place])    rec schedule eye exam as due.      Relevant Medications   Insulin Glargine (LANTUS SOLOSTAR) 100 UNIT/ML Solostar Pen   insulin lispro (HUMALOG) 100 UNIT/ML KiwkPen   CKD stage 3 due to type 2 diabetes mellitus ([place])    Check renal panel today.      Relevant Medications   Insulin Glargine (LANTUS SOLOSTAR) 100 UNIT/ML Solostar Pen   insulin lispro (HUMALOG) 100 UNIT/ML KiwkPen    Other Visit Diagnoses    Need for influenza vaccination        Relevant Orders    Flu Vaccine QUAD 36+ mos PF IM (Fluarix & Fluzone Quad PF) (Completed)        Follow up  plan: Return in about 3 months (around 12/15/2015), or as needed, for follow up visit.

## 2015-09-14 NOTE — Assessment & Plan Note (Signed)
Check renal panel today.

## 2015-09-14 NOTE — Assessment & Plan Note (Signed)
rec schedule eye exam as due.

## 2015-09-14 NOTE — Patient Instructions (Addendum)
Flu shot today Labs today No bending or stooping to prevent falls.  Let's change insulin - pens sent in Continue lantus 35 units daily.  Change humalog to 3 units with breakfast, 6 units with lunch/dinner plus sliding scale: 150-200 2 units; 201-250 4 units; 251-300 6 units; 301-350 8 units; 351-400 10 units; >400 12 units Schedule eye exam as we're due. Return in 3 months for follow up.  Tips for Eating Away From Home If You Have Diabetes Controlling your level of blood glucose, also known as blood sugar, can be challenging. It can be even more difficult when you do not prepare your own meals. The following tips can help you manage your diabetes when you eat away from home. PLANNING AHEAD Plan ahead if you know you will be eating away from home:  Ask your health care provider how to time meals and medicine if you are taking insulin.  Make a list of restaurants near you that offer healthy choices. If they have a carry-out menu, take it home and plan what you will order ahead of time.  Look up the restaurant you want to eat at online. Many chain and fast-food restaurants list nutritional information online. Use this information to choose the healthiest options and to calculate how many carbohydrates will be in your meal.  Use a carbohydrate-counting book or mobile app to look up the carbohydrate content and serving size of the foods you want to eat.  Become familiar with serving sizes and learn to recognize how many servings are in a portion. This will allow you to estimate how many carbohydrates you can eat. FREE FOODS A "free food" is any food or drink that has less than 5 g of carbohydrates per serving. Free foods include:  Many vegetables.  Hard boiled eggs.  Nuts or seeds.  Olives.  Cheeses.  Meats. These types of foods make good appetizer choices and are often available at salad bars. Lemon juice, vinegar, or a low-calorie salad dressing of fewer than 20 calories per serving  can be used as a "free" salad dressing.  CHOICES TO REDUCE CARBOHYDRATES  Substitute nonfat sweetened yogurt with a sugar-free yogurt. Yogurt made from soy milk may also be used, but you will still want a sugar-free or plain option to choose a lower carbohydrate amount.  Ask your server to take away the bread basket or chips from your table.  Order fresh fruit. A salad bar often offers fresh fruit choices. Avoid canned fruit because it is usually packed in sugar or syrup.  Order a salad, and eat it without dressing. Or, create a "free" salad dressing.  Ask for substitutions. For example, instead of [place] fries, request an order of a vegetable such as salad, green beans, or broccoli. OTHER TIPS   If you take insulin, take the insulin once your food arrives to your table. This will ensure your insulin and food are timed correctly.  Ask your server about the portion size before your order, and ask for a take-out box if the portion has more servings than you should have. When your food comes, leave the amount you should have on the plate, and put the rest in the take-out box.  Consider splitting an entree with someone and ordering a side salad.   This information is not intended to replace advice given to you by your health care provider. Make sure you discuss any questions you have with your health care provider.   Document Released: 11/14/2005 Document Revised: 08/05/2015  Document Reviewed: 02/11/2014 Elsevier Interactive Patient Education [employer].

## 2015-09-14 NOTE — Assessment & Plan Note (Signed)
Mild neuropathy. Reviewed sugar log. Discussed insulin dosing and meals. Change insulin regimen as per instructions. Check labs today.

## 2015-09-14 NOTE — Progress Notes (Signed)
Pre visit review using our clinic review tool, if applicable. No additional management support is needed unless otherwise documented below in the visit note.

## 2015-09-16 ENCOUNTER — Other Ambulatory Visit: Payer: Self-pay | Admitting: Family Medicine

## 2015-09-22 ENCOUNTER — Ambulatory Visit (INDEPENDENT_AMBULATORY_CARE_PROVIDER_SITE_OTHER): Payer: Medicare Other | Admitting: *Deleted

## 2015-09-22 DIAGNOSIS — E538 Deficiency of other specified B group vitamins: Secondary | ICD-10-CM | POA: Diagnosis not present

## 2015-09-22 MED ORDER — CYANOCOBALAMIN 1000 MCG/ML IJ SOLN
1000.0000 ug | Freq: Once | INTRAMUSCULAR | Status: AC
  Administered 2015-09-22: 1000 ug via INTRAMUSCULAR

## 2015-10-07 ENCOUNTER — Ambulatory Visit: Payer: Self-pay | Admitting: Cardiology

## 2015-10-07 DIAGNOSIS — C4442 Squamous cell carcinoma of skin of scalp and neck: Secondary | ICD-10-CM | POA: Diagnosis not present

## 2015-10-07 DIAGNOSIS — C44329 Squamous cell carcinoma of skin of other parts of face: Secondary | ICD-10-CM | POA: Diagnosis not present

## 2015-10-07 DIAGNOSIS — D1801 Hemangioma of skin and subcutaneous tissue: Secondary | ICD-10-CM | POA: Diagnosis not present

## 2015-10-07 DIAGNOSIS — L570 Actinic keratosis: Secondary | ICD-10-CM | POA: Diagnosis not present

## 2015-10-07 DIAGNOSIS — D0462 Carcinoma in situ of skin of left upper limb, including shoulder: Secondary | ICD-10-CM | POA: Diagnosis not present

## 2015-10-07 DIAGNOSIS — L821 Other seborrheic keratosis: Secondary | ICD-10-CM | POA: Diagnosis not present

## 2015-10-07 DIAGNOSIS — D0461 Carcinoma in situ of skin of right upper limb, including shoulder: Secondary | ICD-10-CM | POA: Diagnosis not present

## 2015-10-07 DIAGNOSIS — D0439 Carcinoma in situ of skin of other parts of face: Secondary | ICD-10-CM | POA: Diagnosis not present

## 2015-10-07 DIAGNOSIS — Z85828 Personal history of other malignant neoplasm of skin: Secondary | ICD-10-CM | POA: Diagnosis not present

## 2015-10-07 DIAGNOSIS — D225 Melanocytic nevi of trunk: Secondary | ICD-10-CM | POA: Diagnosis not present

## 2015-10-07 DIAGNOSIS — C44622 Squamous cell carcinoma of skin of right upper limb, including shoulder: Secondary | ICD-10-CM | POA: Diagnosis not present

## 2015-10-07 DIAGNOSIS — C44629 Squamous cell carcinoma of skin of left upper limb, including shoulder: Secondary | ICD-10-CM | POA: Diagnosis not present

## 2015-10-08 ENCOUNTER — Encounter: Payer: Self-pay | Admitting: Cardiology

## 2015-10-08 ENCOUNTER — Ambulatory Visit (INDEPENDENT_AMBULATORY_CARE_PROVIDER_SITE_OTHER): Payer: Medicare Other | Admitting: Cardiology

## 2015-10-08 VITALS — BP 158/86 | HR 63 | Ht 72.0 in | Wt 215.8 lb

## 2015-10-08 DIAGNOSIS — I259 Chronic ischemic heart disease, unspecified: Secondary | ICD-10-CM

## 2015-10-08 DIAGNOSIS — N183 Chronic kidney disease, stage 3 unspecified: Secondary | ICD-10-CM

## 2015-10-08 DIAGNOSIS — I5023 Acute on chronic systolic (congestive) heart failure: Secondary | ICD-10-CM | POA: Diagnosis not present

## 2015-10-08 DIAGNOSIS — I129 Hypertensive chronic kidney disease with stage 1 through stage 4 chronic kidney disease, or unspecified chronic kidney disease: Secondary | ICD-10-CM

## 2015-10-08 LAB — BASIC METABOLIC PANEL
BUN: 27 mg/dL — ABNORMAL HIGH (ref 7–25)
CALCIUM: 9.3 mg/dL (ref 8.6–10.3)
CHLORIDE: 104 mmol/L (ref 98–110)
CO2: 19 mmol/L — ABNORMAL LOW (ref 20–31)
CREATININE: 1.51 mg/dL — AB (ref 0.70–1.11)
Glucose, Bld: 240 mg/dL — ABNORMAL HIGH (ref 65–99)
Potassium: 4.7 mmol/L (ref 3.5–5.3)
SODIUM: 136 mmol/L (ref 135–146)

## 2015-10-08 LAB — HEPATIC FUNCTION PANEL
ALBUMIN: 3.9 g/dL (ref 3.6–5.1)
ALK PHOS: 42 U/L (ref 40–115)
ALT: 22 U/L (ref 9–46)
AST: 32 U/L (ref 10–35)
Bilirubin, Direct: 0.1 mg/dL (ref <=0.2)
Indirect Bilirubin: 0.4 mg/dL (ref 0.2–1.2)
TOTAL PROTEIN: 7.7 g/dL (ref 6.1–8.1)
Total Bilirubin: 0.5 mg/dL (ref 0.2–1.2)

## 2015-10-08 LAB — LIPID PANEL
CHOL/HDL RATIO: 2.9 ratio (ref <=5.0)
Cholesterol: 113 mg/dL — ABNORMAL LOW (ref 125–200)
HDL: 39 mg/dL — AB (ref >=40)
LDL CALC: 47 mg/dL (ref <130)
TRIGLYCERIDES: 134 mg/dL (ref <150)
VLDL: 27 mg/dL (ref <30)

## 2015-10-08 NOTE — Patient Instructions (Addendum)
Medication Instructions:  Your physician recommends that you continue on your current medications as directed. Please refer to the Current Medication list given to you today.  Labwork: Lp/bmt/hfp  Testing/Procedures: none  Follow-Up: Your physician wants you to follow-up in: 6 month ov with Dr Shirley Friar will receive a reminder letter in the mail two months in advance. If you don't receive a letter, please call our office to schedule the follow-up appointment.  If you need a refill on your cardiac medications before your next appointment, please call your pharmacy.

## 2015-10-08 NOTE — Progress Notes (Signed)
Cardiology Office Note   Date:  10/08/2015   ID:  Creston, Klas 1931/04/13, MRN [id number]  PCP:  [name], MD  Cardiologist: Darlin Coco MD  Chief Complaint  Patient presents with  . Coronary Artery Disease      History of Present Illness: Frank Simpson is a 81 y.o. male who presents for  Six-month follow-up visit  Frank Simpson is a 81 y.o. male who presents for scheduled follow-up office visit. . On 08/06/2012 the patient had a STEMI with DES to the mid LAD. He was treated with dual antiplatelet therapy for more than a year. Presently he is on just aspirin alone.. His other issues include CKD, HTN, DM, HLD, OA, urinary incontinence, glaucoma, melanoma and thrombocytopenia.  The patient was hospitalized in November 2015 for dizzy spells and falls. He was found to have intermittent complete heart block with 8 second pauses. He was seen by Dr. Lovena Le. He underwent placement of a dual-chamber St. Jude pacemaker. Since the pacemaker the patient has not had a further episodes of dizziness or syncope. Since last visit he has been doing well. No recurrent angina. The patient has a history of chronic right-sided chest discomfort secondary to postherpetic neuralgia. He had his episode of shingles about 25 months ago. Since last visit he has not been experiencing any definite recurrent angina pectoris. He is diabetic. He has been having occasional hypoglycemic episodes. He has issues with poor balance. He walks with a cane. He has occasional episodes of vertigo with the room spinning around.  The patient has not been having any symptoms of congestive heart failure.. Recent echocardiogram 03/11/15 showed mild LVH with ejection fraction 50-55% and grade 1 diastolic dysfunction. Since we last saw him he had an episode of syncope and unresponsiveness while at his PCP office. He was sent to [facility] where he was hospitalized from 03/10/15 until 03/12/15.  His blood sugar was not unusually low and no definite cause of his syncope was found. He was monitored. There were no episodes of bradycardia. He has a functioning pacemaker. The patient denies any recent chest discomfort. He has not had to take any sublingual nitroglycerin. He continues to have problems with intermittent vertigo and dizziness. He still has a tendency toward falling. He walks with a walker. Still has some intermittent post herpetic neuralgia symptoms.  the patient had a mechanical fall last month when he became overbalanced. He did not suffer any serious injury. He has not been having any syncopal episodes.  He has had a lot of skin cancers removed by his dermatologist Dr. Elvera Lennox.  Past Medical History  Diagnosis Date  . Hypertension   . Diabetes mellitus type 2 with retinopathy ([place]) 1994    dm edu 2012  . Hyperlipidemia   . Osteoarthritis   . CRI (chronic renal insufficiency)     baseline Cr seems to be 1.7-1.8  . Urinary incontinence     s/p PTNS didn't help  . Glaucoma     and cataracts  . History of melanoma   . CAD (coronary artery disease)     07/2012 acute STEMI, mid LAD PCI - DES; cath 09/2012 patent LAD stent, non-hemodynamically significant Left Main/LAD disease, EF 55%  . Thrombocytopenia ([place])   . BCC (basal cell carcinoma of skin) 2015    L neck (Dr. Sherrye Payor), L forearm [facility])  . Complication of anesthesia     confused after cath 10/15/2012  . CVA (cerebral infarction) 09/2012  remote anterior limb of left internal capsule  . Post herpetic neuralgia   . Shingles in March 2014    right chest, across the back  . Syncope 04/02/2013  . Blurred vision   . Dysplastic nevus 2015    L upper back, Lat margin involved ([place])  . CHF (congestive heart failure) ([place])     declined [facility] CM services  . Ischemic heart disease   . Benign positional vertigo     Past Surgical History  Procedure Laterality Date  . Replacement total knee   04/2010    RIGHT KNEE  . Tonsillectomy    . Cardiovascular stress test  04/27/2010    EF 75%, nuclear stress test with normal perfusion, no ischemia  . Foot surgery      metal pin in place  . Right shoulder    . Finger surgery      amputated finger  . Lexiscan myoview  10/2011    negative for ischemia  . Cataract extraction  12/12, 1/13    bilateral  . Coronary stent placement  07/2012    DES to mid LAD for STEMI  . Cardiac catheterization  10/15/2012  . Carotid US  03/2013    WNL  . US echocardiography  03/2013    inf/septal hypokinesis, mild LVH, EF 45%, LA mildly dilated  . Penile prosthesis implant    . Left heart catheterization with coronary angiogram N/A 08/06/2012    Procedure: LEFT HEART CATHETERIZATION WITH CORONARY ANGIOGRAM;  Surgeon: Burnell Blanks, MD;  Location: [facility] CATH LAB;  Service: Cardiovascular;  Laterality: N/A;  . Left heart catheterization with coronary angiogram N/A 10/15/2012    Procedure: LEFT HEART CATHETERIZATION WITH CORONARY ANGIOGRAM;  Surgeon: Peter M Martinique, MD;  Location: [facility] CATH LAB;  Service: Cardiovascular;  Laterality: N/A;  . Permanent pacemaker insertion N/A 10/06/2014    Procedure: PERMANENT PACEMAKER INSERTION;  Surgeon: Evans Lance, MD;  Location[facility] LAB;  Service: Cardiovascular;  Laterality: N/A;  . Carotid US  02/2015    1-39% B carotid stenosis     Current Outpatient Prescriptions  Medication Sig Dispense Refill  . acetaminophen (TYLENOL) 500 MG tablet Take 500 mg by mouth every 6 (six) hours as needed (pain).     [name] Kitchen amLODipine (NORVASC) 5 MG tablet Take 1 tablet (5 mg total) by mouth daily. 90 tablet 2  . aspirin 81 MG chewable tablet Chew 1 tablet (81 mg total) by mouth daily.    . cyanocobalamin (,VITAMIN B-12,) 1000 MCG/ML injection Inject 1 mL (1,000 mcg total) into the muscle every 30 (thirty) days. 1 mL   . Insulin Glargine (LANTUS SOLOSTAR) 100 UNIT/ML Solostar Pen Inject 36 Units into the skin daily. 3 mo supply      . insulin lispro (HUMALOG) 100 UNIT/ML KiwkPen 3u with breakfast, 6u with lunch/dinner plus sliding scale, 63mosupply 12 mL 3  . Insulin Pen Needle 31G X 8 MM MISC Use as directed with Lantus solostar. Dx 250.62. 100 each 5  . Insulin Syringe-Needle U-100 (BD INSULIN SYRINGE ULTRAFINE) 31G X 5/16" 0.5 ML MISC Inject 4x a day 400 each 3  . isosorbide mononitrate (IMDUR) 30 MG 24 hr tablet Take 1 tablet (30 mg total) by mouth daily. 90 tablet 3  . meclizine (ANTIVERT) 25 MG tablet Take 25 mg by mouth every 6 (six) hours as needed for dizziness.    . nitroGLYCERIN (NITROSTAT) 0.4 MG SL tablet Place 1 tablet (0.4 mg total) under the tongue every  5 (five) minutes as needed for chest pain. Up to three doses,if pain continues call 911 25 tablet 3  . ONE TOUCH ULTRA TEST test strip CHECK FOUR TIMES DAILY,DX E11.319 200 each 5  . rosuvastatin (CRESTOR) 40 MG tablet Take 1 tablet (40 mg total) by mouth daily. 90 tablet 3  . sertraline (ZOLOFT) 25 MG tablet Take 1 tablet (25 mg total) by mouth daily. 90 tablet 3  . tamsulosin (FLOMAX) 0.4 MG CAPS capsule Take 1 capsule (0.4 mg total) by mouth daily. 90 capsule 3   No current facility-administered medications for this visit.    Allergies:   Januvia; Januvia; Metformin and related; Oxycodone; and Penicillins    Social History:  The patient  reports that he has never smoked. He has never used smokeless tobacco. He reports that he does not drink alcohol or use illicit drugs.   Family History:  The patient's family history includes Cancer in his father; Diabetes in his brother and mother; Stroke in his mother.    ROS:  Please see the history of present illness.   Otherwise, review of systems are positive for none.   All other systems are reviewed and negative.    PHYSICAL EXAM: VS:  BP 158/86 mmHg  Pulse 63  Ht 6' (1.829 m)  Wt 215 lb 12.8 oz (97.886 kg)  BMI 29.26 kg/m2 , BMI Body mass index is 29.26 kg/(m^2). GEN: Well nourished, well developed,  in no acute distress HEENT: normal Neck: no JVD, carotid bruits, or masses Cardiac: RRR; no murmurs, rubs, or gallops,no edema  Respiratory:  clear to auscultation bilaterally, normal work of breathing GI: soft, nontender, nondistended, + BS MS: no deformity or atrophy Skin: warm and dry, no rash Neuro:  Strength and sensation are intact Psych: euthymic mood, full affect   EKG:  EKG is ordered today. The ekg ordered today demonstrates  Sinus rhythm with first-degree AV block , left axis deviation, nonspecific T-wave changes.   Recent Labs: 03/10/2015: Hemoglobin 15.8; Platelets 150 03/11/2015: ALT 14 09/14/2015: BUN 28*; Creatinine, Ser 1.82*; Potassium 4.3; Sodium 140    Lipid Panel    Component Value Date/Time   CHOL 110 03/11/2015 0508   CHOL 139 11/17/2010   TRIG 100 03/11/2015 0508   TRIG 152 11/17/2010   HDL 38* 03/11/2015 0508   CHOLHDL 2.9 03/11/2015 0508   VLDL 20 03/11/2015 0508   LDLCALC 52 03/11/2015 0508   LDLDIRECT 55.0 12/30/2014 0936      Wt Readings from Last 3 Encounters:  10/08/15 215 lb 12.8 oz (97.886 kg)  09/14/15 211 lb 8 oz (95.936 kg)  06/11/15 211 lb (95.709 kg)        ASSESSMENT AND PLAN:  1. Ischemic heart disease status post STEMI with drug-eluting stent to the mid LAD on 08/06/12. Previously on dual antiplatelet therapy. He was switched to aspirin alone after his episodes of frequent falls in November 2015 2. Hypertensive heart disease without heart failure 3. Hypercholesterolemia 4. diabetes mellitus adult onset followed by PCP 5. benign positional vertigo 6. postherpetic neuralgia 7. chronic kidney disease 8. Osteoarthritis 9. Intermittent complete heart block with functioning St. Jude dual-chamber pacemaker implanted by Dr. Lovena Le on 10/06/14. 10. Skin cancers followed by Dr. Elvera Lennox   Current medicines are reviewed at length with the patient today.  The patient does not have concerns regarding medicines.  The following  changes have been made:  no change  Labs/ tests ordered today include:   Orders Placed This Encounter  Procedures  . Lipid panel  . Hepatic function panel  . Basic metabolic panel  . EKG 12-Lead     disposition: we're checking lab work today including lipid panel hepatic function panel and basal metabolic panel.  The patient will continue current medication. He needs to be more careful with dietary sweets. Recheck in 6 months for a follow-up office visit with Dr. Angelena Form  Signed, Darlin Coco MD 10/08/2015 6:11 PM    [address], [place], [place]  [address] Phone: [phone]; Fax: [phone]

## 2015-10-09 NOTE — Progress Notes (Signed)
Quick Note:  Please report to patient. The recent labs are stable. Continue same medication and careful diet. The lipids are good. The kidney function is better. The blood sugar is too high. Needs to watch diet better and stay away from sweets. ______

## 2015-10-12 ENCOUNTER — Encounter: Payer: Self-pay | Admitting: Nurse Practitioner

## 2015-10-16 ENCOUNTER — Encounter: Payer: Self-pay | Admitting: Family Medicine

## 2015-10-16 ENCOUNTER — Other Ambulatory Visit: Payer: Self-pay | Admitting: Cardiology

## 2015-10-25 NOTE — Progress Notes (Signed)
Electrophysiology Office Note Date: 10/26/2015  ID:  Frank Simpson, Frank Simpson 01-06-1931, MRN [id number]  PCP: [name], MD Primary Cardiologist: Mare Ferrari - to establish with [place] Electrophysiologist: Lovena Le  CC: Pacemaker follow-up  Frank Simpson is a 81 y.o. male seen today for Dr Lovena Le.  He presents today for routine electrophysiology followup.  Since last being seen in our clinic, the patient reports doing reasonably well. He still struggles with frequent falls mostly when he is outside.  He does not have dizziness or syncope, he states that he " can't make his legs do what he wants them to do".   He denies chest pain, palpitations, dyspnea, PND, orthopnea, nausea, vomiting, dizziness, syncope, edema, weight gain, or early satiety.  Device History: STJ dual chamber PPM implanted 2015 for syncope and intermittent complete heart block   Past Medical History  Diagnosis Date  . Hypertension   . Diabetes mellitus type 2 with retinopathy ([place]) 1994    dm edu 2012  . Hyperlipidemia   . Osteoarthritis   . CRI (chronic renal insufficiency)     baseline Cr seems to be 1.7-1.8  . Urinary incontinence     s/p PTNS didn't help  . Glaucoma     and cataracts  . History of melanoma   . CAD (coronary artery disease)     07/2012 acute STEMI, mid LAD PCI - DES; cath 09/2012 patent LAD stent, non-hemodynamically significant Left Main/LAD disease, EF 55%  . Thrombocytopenia ([place])   . BCC (basal cell carcinoma of skin) 2015    L neck (Dr. Sherrye Payor), L forearm [facility])  . Complication of anesthesia     confused after cath 10/15/2012  . CVA (cerebral infarction) 09/2012    remote anterior limb of left internal capsule  . Post herpetic neuralgia   . Shingles in March 2014    right chest, across the back  . Syncope 04/02/2013  . Blurred vision   . Dysplastic nevus 2015    L upper back, Lat margin involved ([place])  . CHF (congestive heart failure) ([place])     declined THN  CM services  . Ischemic heart disease   . Benign positional vertigo   . Squamous cell skin cancer 2016    multiple sites - mandible, temple, wrist, forearm [facility])   Past Surgical History  Procedure Laterality Date  . Replacement total knee  04/2010    RIGHT KNEE  . Tonsillectomy    . Cardiovascular stress test  04/27/2010    EF 75%, nuclear stress test with normal perfusion, no ischemia  . Foot surgery      metal pin in place  . Right shoulder    . Finger surgery      amputated finger  . Lexiscan myoview  10/2011    negative for ischemia  . Cataract extraction  12/12, 1/13    bilateral  . Coronary stent placement  07/2012    DES to mid LAD for STEMI  . Cardiac catheterization  10/15/2012  . Carotid US  03/2013    WNL  . US echocardiography  03/2013    inf/septal hypokinesis, mild LVH, EF 45%, LA mildly dilated  . Penile prosthesis implant    . Left heart catheterization with coronary angiogram N/A 08/06/2012    Procedure: LEFT HEART CATHETERIZATION WITH CORONARY ANGIOGRAM;  Surgeon: Burnell Blanks, MD;  Location: [facility] CATH LAB;  Service: Cardiovascular;  Laterality: N/A;  . Left heart catheterization with coronary angiogram N/A 10/15/2012  Procedure: LEFT HEART CATHETERIZATION WITH CORONARY ANGIOGRAM;  Surgeon: Peter M Martinique, MD;  Location: [facility] CATH LAB;  Service: Cardiovascular;  Laterality: N/A;  . Permanent pacemaker insertion N/A 10/06/2014    Procedure: PERMANENT PACEMAKER INSERTION;  Surgeon: Evans Lance, MD;  Location: [facility] CATH LAB;  Service: Cardiovascular;  Laterality: N/A;  . Carotid US  02/2015    1-39% B carotid stenosis    Current Outpatient Prescriptions  Medication Sig Dispense Refill  . acetaminophen (TYLENOL) 500 MG tablet Take 500 mg by mouth every 6 (six) hours as needed (pain).     [name] Kitchen amLODipine (NORVASC) 5 MG tablet Take 1 tablet (5 mg total) by mouth daily. 90 tablet 3  . aspirin 81 MG chewable tablet Chew 1 tablet (81 mg total) by mouth daily.     . cyanocobalamin (,VITAMIN B-12,) 1000 MCG/ML injection Inject 1 mL (1,000 mcg total) into the muscle every 30 (thirty) days. 1 mL   . Insulin Glargine (LANTUS SOLOSTAR) 100 UNIT/ML Solostar Pen Inject 36 Units into the skin daily. 3 mo supply    . insulin lispro (HUMALOG) 100 UNIT/ML KiwkPen 3u with breakfast, 6u with lunch/dinner plus sliding scale, 63mosupply 12 mL 3  . Insulin Pen Needle 31G X 8 MM MISC Use as directed with Lantus solostar. Dx 250.62. 100 each 5  . Insulin Syringe-Needle U-100 (BD INSULIN SYRINGE ULTRAFINE) 31G X 5/16" 0.5 ML MISC Inject 4x a day 400 each 3  . isosorbide mononitrate (IMDUR) 30 MG 24 hr tablet Take 1 tablet (30 mg total) by mouth daily. 90 tablet 3  . meclizine (ANTIVERT) 25 MG tablet Take 25 mg by mouth every 6 (six) hours as needed for dizziness.    . nitroGLYCERIN (NITROSTAT) 0.4 MG SL tablet Place 1 tablet (0.4 mg total) under the tongue every 5 (five) minutes as needed for chest pain. Up to three doses,if pain continues call 911 25 tablet 3  . ONE TOUCH ULTRA TEST test strip CHECK FOUR TIMES DAILY,DX E11.319 200 each 5  . rosuvastatin (CRESTOR) 40 MG tablet Take 1 tablet (40 mg total) by mouth daily. 90 tablet 3  . sertraline (ZOLOFT) 25 MG tablet Take 1 tablet (25 mg total) by mouth daily. 90 tablet 3  . tamsulosin (FLOMAX) 0.4 MG CAPS capsule Take 1 capsule (0.4 mg total) by mouth daily. 90 capsule 3   No current facility-administered medications for this visit.    Allergies:   Januvia; Januvia; Metformin and related; Oxycodone; and Penicillins   Social History: Social History   Social History  . Marital Status: Married    Spouse Name: BPamala Hurry . Number of Children: 1  . Years of Education: 11   Occupational History  . retired    Social History Main Topics  . Smoking status: Never Smoker   . Smokeless tobacco: Never Used  . Alcohol Use: No  . Drug Use: No  . Sexual Activity: Not Currently   Other Topics Concern  . Not on file    Social History Narrative   Caffeine: 2-3 caffeinated drinks/day   Lives with wife BPamala Hurry, no pets   Occupation: retired, used to work cigarette factory   Edu: 11th grade   Activity: works around house   Diet: healthy overall.  Good fruits and vegetables, good amt water    Family History: Family History  Problem Relation Age of Onset  . Stroke Mother     hemorrhage  . Diabetes Mother   . Cancer Father  lung  . Diabetes Brother      Review of Systems: All other systems reviewed and are otherwise negative except as noted above.   Physical Exam: VS:  BP 150/76 mmHg  Pulse 64  Ht 6' (1.829 m)  Wt 219 lb 3.2 oz (99.428 kg)  BMI 29.72 kg/m2  SpO2 97% , BMI Body mass index is 29.72 kg/(m^2).  GEN- The patient is well appearing, alert and oriented x 3 today.   HEENT: normocephalic, atraumatic; sclera clear, conjunctiva pink; hearing intact; oropharynx clear; neck supple  Lungs- Clear to ausculation bilaterally, normal work of breathing.  No wheezes, rales, rhonchi Heart- Regular rate and rhythm  GI- soft, non-tender, non-distended, bowel sounds present  Extremities- no clubbing, cyanosis, or edema; DP/PT/radial pulses 2+ bilaterally MS- no significant deformity or atrophy Skin- warm and dry, no rash or lesion; PPM pocket well healed Psych- euthymic mood, full affect Neuro- strength and sensation are intact  PPM Interrogation- reviewed in detail today,  See PACEART report  EKG:  EKG is not ordered today.  Recent Labs: 03/10/2015: Hemoglobin 15.8; Platelets 150 10/08/2015: ALT 22; BUN 27*; Creat 1.51*; Potassium 4.7; Sodium 136   Wt Readings from Last 3 Encounters:  10/26/15 219 lb 3.2 oz (99.428 kg)  10/08/15 215 lb 12.8 oz (97.886 kg)  09/14/15 211 lb 8 oz (95.936 kg)     Other studies Reviewed: Additional studies/ records that were reviewed today include: Dr Tanna Furry notes, Dr Sherryl Barters notes  Assessment and Plan:  1.  Symptomatic intermittent complete  heart block Normal PPM function See Pace Art report No changes today  2.  CAD No recent ischemic symptoms Continue ASA/statin  3.  HTN Stable No change required today  4.  Frequent falls Discussed fall precautions today   Current medicines are reviewed at length with the patient today.   The patient does not have concerns regarding his medicines.  The following changes were made today:  none  Labs/ tests ordered today include: none   Disposition:   Follow up with [facility] transmissions, Dr Lovena Le in 12 months   Signed, Chanetta Marshall, NP 10/26/2015 9:02 AM  [facility] [address] [place] [address] [phone] (office) [phone] (fax)

## 2015-10-26 ENCOUNTER — Ambulatory Visit (INDEPENDENT_AMBULATORY_CARE_PROVIDER_SITE_OTHER): Payer: Medicare Other | Admitting: Nurse Practitioner

## 2015-10-26 ENCOUNTER — Encounter: Payer: Self-pay | Admitting: Internal Medicine

## 2015-10-26 ENCOUNTER — Encounter: Payer: Self-pay | Admitting: Nurse Practitioner

## 2015-10-26 VITALS — BP 150/76 | HR 64 | Ht 72.0 in | Wt 219.2 lb

## 2015-10-26 DIAGNOSIS — I442 Atrioventricular block, complete: Secondary | ICD-10-CM

## 2015-10-26 DIAGNOSIS — I259 Chronic ischemic heart disease, unspecified: Secondary | ICD-10-CM | POA: Diagnosis not present

## 2015-10-26 DIAGNOSIS — I10 Essential (primary) hypertension: Secondary | ICD-10-CM | POA: Diagnosis not present

## 2015-10-26 DIAGNOSIS — R296 Repeated falls: Secondary | ICD-10-CM

## 2015-10-26 LAB — CUP PACEART INCLINIC DEVICE CHECK
Battery Voltage: 3.04 V
Brady Statistic RA Percent Paced: 33 %
Brady Statistic RV Percent Paced: 3.2 %
Implantable Lead Implant Date: 20151109
Implantable Lead Location: 753859
Lead Channel Impedance Value: 510 Ohm
Lead Channel Pacing Threshold Amplitude: 1 V
Lead Channel Pacing Threshold Pulse Width: 0.4 ms
Lead Channel Setting Pacing Amplitude: 2 V
Lead Channel Setting Pacing Amplitude: 2.5 V
Lead Channel Setting Pacing Pulse Width: 0.4 ms
MDC IDC LEAD IMPLANT DT: 20151109
MDC IDC LEAD LOCATION: 753860
MDC IDC MSMT LEADCHNL RA PACING THRESHOLD AMPLITUDE: 1 V
MDC IDC MSMT LEADCHNL RA PACING THRESHOLD PULSEWIDTH: 0.4 ms
MDC IDC MSMT LEADCHNL RA SENSING INTR AMPL: 5 mV
MDC IDC MSMT LEADCHNL RV IMPEDANCE VALUE: 530 Ohm
MDC IDC MSMT LEADCHNL RV SENSING INTR AMPL: 12 mV
MDC IDC PG SERIAL: 7666171
MDC IDC SESS DTM: 20161128142022
MDC IDC SET LEADCHNL RV SENSING SENSITIVITY: 2 mV
Pulse Gen Model: 2240

## 2015-10-26 NOTE — Patient Instructions (Signed)
Medication Instructions:   CONTINUE SAME MEDICATIONS    If you need a refill on your cardiac medications before your next appointment, please call your pharmacy.  Labwork: NONE ORDER TODAY    Testing/Procedures: NONE ORDER TODAY    Follow-Up:    Remote monitoring is used to monitor your Pacemaker of ICD from home. This monitoring reduces the number of office visits required to check your device to one time per year. It allows [place] to keep an eye on the functioning of your device to ensure it is working properly. You are scheduled for a device check from home on .12/2814/ You may send your transmission at any time that day. If you have a wireless device, the transmission will be sent automatically. After your physician reviews your transmission, you will receive a postcard with your next transmission date.  Your physician wants you to follow-up in: [place] will receive a reminder letter in the mail two months in advance. If you don't receive a letter, please call our office to schedule the follow-up appointment.     Any Other Special Instructions Will Be Listed Below (If Applicable).

## 2015-10-27 ENCOUNTER — Telehealth: Payer: Self-pay | Admitting: Family Medicine

## 2015-10-27 ENCOUNTER — Ambulatory Visit (INDEPENDENT_AMBULATORY_CARE_PROVIDER_SITE_OTHER): Payer: Medicare Other

## 2015-10-27 DIAGNOSIS — E538 Deficiency of other specified B group vitamins: Secondary | ICD-10-CM

## 2015-10-27 MED ORDER — INSULIN PEN NEEDLE 31G X 8 MM MISC: Status: DC

## 2015-10-27 MED ORDER — SERTRALINE HCL 50 MG PO TABS: 50.0000 mg | ORAL_TABLET | Freq: Every day | ORAL | Status: DC

## 2015-10-27 MED ORDER — CYANOCOBALAMIN 1000 MCG/ML IJ SOLN
1000.0000 ug | Freq: Once | INTRAMUSCULAR | Status: AC
  Administered 2015-10-27: 1000 ug via INTRAMUSCULAR

## 2015-10-27 NOTE — Telephone Encounter (Signed)
Pt dropped off log for Dr. Danise Mina to review. Log in Dr. Bosie Clos rx tower in-box.  Thank you  [phone]

## 2015-10-27 NOTE — Telephone Encounter (Signed)
In your IN box for review.

## 2015-10-27 NOTE — Telephone Encounter (Signed)
Frank Simpson left note that she had previously discussed with Frank Simpson at some point pt may need to increase sertraline dosage; pt and pts wife seem to be irritating each other more. Frank Simpson request new sertraline rx with higher dosage to [facility]. Also request refill pen needles to [facility] (done per protocol; pt last seen 09/14/15). Pt's wife request cb when sertraline refilled.

## 2015-10-27 NOTE — Telephone Encounter (Signed)
Plz notify higher sertraline dose was sent in. May double up on current dose until runs out.

## 2015-10-28 NOTE — Telephone Encounter (Signed)
Message left advising patient/wife.

## 2015-11-02 ENCOUNTER — Telehealth: Payer: Self-pay | Admitting: Family Medicine

## 2015-11-02 NOTE — Telephone Encounter (Signed)
Patient's wife notified and verbalized understanding. She will call with readings this week.

## 2015-11-02 NOTE — Telephone Encounter (Signed)
Received log of sugars from patient -  Fasting 91-200s, mostly 100s.  Pre meal 100-300s (mostly high)  Lab Results  Component Value Date   HGBA1C 8.6* 09/14/2015    Continue current regimen: lantus 36 units daily.  Humalog 3 units with breakfast, 6 units with lunch/dinner plus sliding scale: 150-200 2 units; 201-250 4 units; 251-300 6 units; 301-350 8 units; 351-400 10 units; >400 12 units I'd like her to check a few extra readings over next 1-2 days and call me with these values - check pre meal and 2 hours post meal on same meal.

## 2015-11-06 ENCOUNTER — Telehealth: Payer: Self-pay | Admitting: Family Medicine

## 2015-11-06 NOTE — Telephone Encounter (Signed)
Mrs Bigley left v/m returning call and I left v/m requesting Mrs Cordell to call [facility].

## 2015-11-06 NOTE — Telephone Encounter (Signed)
Pt's wife brought in a record of his blood sugar testing from the past few days. Best number to contact pt [phone]. Placing records on cart to go to Dr. Danise Mina today.

## 2015-11-06 NOTE — Telephone Encounter (Signed)
[name] left v/m requesting cb to give BS readings. Left v/m requesting [name] call [facility].

## 2015-11-08 NOTE — Telephone Encounter (Signed)
I have not received yet.

## 2015-11-11 NOTE — Telephone Encounter (Signed)
Did you receive this yet? It should've been delivered by [facility].

## 2015-11-14 MED ORDER — INSULIN LISPRO 100 UNIT/ML (KWIKPEN): PEN_INJECTOR | SUBCUTANEOUS | Status: DC

## 2015-11-14 MED ORDER — INSULIN GLARGINE 100 UNIT/ML SOLOSTAR PEN: 36.0000 [IU] | PEN_INJECTOR | Freq: Every day | SUBCUTANEOUS | Status: DC

## 2015-11-14 NOTE — Telephone Encounter (Signed)
plz notify - I received last week's readings late but they are overall looking better.  Fasting 80-180 Pre lunch 170-300 Pre dinner 200-300s Long acting 36 units. rapid acting average ~26 units  New Regimen -  Continue lantus 36 units daily. New mealtime - Humalog 4 units with breakfast, 8 units with lunch/dinner plus sliding scale: 150-200 2 units; 201-250 6 units; 251-300 10 units; >300 15 units  And try to keep more routine mealtime hours if able. Watch for low sugars and let us know if these develop.  Continue checking pre breakfast, pre lunch, pre dinner, and at bedtime. Bring log in 2 wks Lab Results  Component Value Date   HGBA1C 8.6* 09/14/2015

## 2015-11-14 NOTE — Telephone Encounter (Signed)
Also plz notify I received notice from [facility] that lantus will no longer be covered by insurance. Will change brands to basaglar (should be similar med to lantus)  New med sent to pharmacy.

## 2015-11-16 NOTE — Telephone Encounter (Signed)
Mrs Munn left v/m requesting cb.

## 2015-11-16 NOTE — Telephone Encounter (Signed)
See phone note 11/06/15.

## 2015-11-16 NOTE — Telephone Encounter (Signed)
Pt left v/m requesting cb about med he has never taken before and pt thinks this is the wrong med. Pt request cb ASAP.

## 2015-11-16 NOTE — Telephone Encounter (Signed)
Spoke with patient and advised insurance was no longer going to cover lantus after the first of the year and that is why there is a new med at the pharmacy. He asked that I call back later and speak to his wife. Will call back.

## 2015-11-19 NOTE — Telephone Encounter (Signed)
Patient's wife notified and verbalized understanding.

## 2015-11-24 DIAGNOSIS — E1042 Type 1 diabetes mellitus with diabetic polyneuropathy: Secondary | ICD-10-CM | POA: Diagnosis not present

## 2015-11-29 DIAGNOSIS — H35039 Hypertensive retinopathy, unspecified eye: Secondary | ICD-10-CM

## 2015-11-29 HISTORY — DX: Hypertensive retinopathy, unspecified eye: H35.039

## 2015-12-01 ENCOUNTER — Telehealth: Payer: Self-pay | Admitting: Family Medicine

## 2015-12-01 ENCOUNTER — Ambulatory Visit (INDEPENDENT_AMBULATORY_CARE_PROVIDER_SITE_OTHER): Payer: Medicare Other

## 2015-12-01 DIAGNOSIS — E538 Deficiency of other specified B group vitamins: Secondary | ICD-10-CM

## 2015-12-01 MED ORDER — CYANOCOBALAMIN 1000 MCG/ML IJ SOLN
1000.0000 ug | Freq: Once | INTRAMUSCULAR | Status: AC
  Administered 2015-12-01: 1000 ug via INTRAMUSCULAR

## 2015-12-01 NOTE — Telephone Encounter (Signed)
In your IN box for review.

## 2015-12-01 NOTE — Telephone Encounter (Signed)
Wife dropped off sheets of blood sugar readings. Placing in rx tower.

## 2015-12-07 NOTE — Telephone Encounter (Signed)
reviewed log -   Fasting - 85-200, mostly low 100s (ok) Pre lunch - 99-300, mostly high 100s (ok) Pre dinner - 200-400s (worse)  Long acting 36u daily. Rapid acting averaging ~30/day  New Regimen -  Increase lantus to 38 units daily. Continue same mealtime - Humalog 4 units with breakfast, 8 units with lunch/dinner plus sliding scale: 150-200 2 units; 201-250 6 units; 251-300 10 units; >300 15 units (she had actually been giving less mealtime than what is written for in sliding scale).   Bring another log in 2 wks.

## 2015-12-08 NOTE — Telephone Encounter (Signed)
Patient's wife notified and verbalized understanding.

## 2015-12-15 ENCOUNTER — Ambulatory Visit (INDEPENDENT_AMBULATORY_CARE_PROVIDER_SITE_OTHER): Payer: Medicare Other | Admitting: Family Medicine

## 2015-12-15 ENCOUNTER — Encounter: Payer: Self-pay | Admitting: Family Medicine

## 2015-12-15 VITALS — BP 136/86 | HR 72 | Temp 98.0°F | Wt 219.5 lb

## 2015-12-15 DIAGNOSIS — Z794 Long term (current) use of insulin: Secondary | ICD-10-CM

## 2015-12-15 DIAGNOSIS — N183 Chronic kidney disease, stage 3 unspecified: Secondary | ICD-10-CM

## 2015-12-15 DIAGNOSIS — I129 Hypertensive chronic kidney disease with stage 1 through stage 4 chronic kidney disease, or unspecified chronic kidney disease: Secondary | ICD-10-CM

## 2015-12-15 DIAGNOSIS — E538 Deficiency of other specified B group vitamins: Secondary | ICD-10-CM | POA: Diagnosis not present

## 2015-12-15 DIAGNOSIS — E11319 Type 2 diabetes mellitus with unspecified diabetic retinopathy without macular edema: Secondary | ICD-10-CM

## 2015-12-15 DIAGNOSIS — E1122 Type 2 diabetes mellitus with diabetic chronic kidney disease: Secondary | ICD-10-CM

## 2015-12-15 LAB — RENAL FUNCTION PANEL
ALBUMIN: 3.7 g/dL (ref 3.5–5.2)
BUN: 27 mg/dL — AB (ref 6–23)
CO2: 29 meq/L (ref 19–32)
CREATININE: 1.9 mg/dL — AB (ref 0.40–1.50)
Calcium: 9.2 mg/dL (ref 8.4–10.5)
Chloride: 105 mEq/L (ref 96–112)
GFR: 36.16 mL/min — ABNORMAL LOW (ref >60.00)
Glucose, Bld: 147 mg/dL — ABNORMAL HIGH (ref 70–99)
PHOSPHORUS: 3.3 mg/dL (ref 2.3–4.6)
Potassium: 4.4 mEq/L (ref 3.5–5.1)
SODIUM: 139 meq/L (ref 135–145)

## 2015-12-15 LAB — HEMOGLOBIN A1C: Hgb A1c MFr Bld: 8.4 % — ABNORMAL HIGH (ref 4.6–6.5)

## 2015-12-15 LAB — VITAMIN B12: VITAMIN B 12: 578 pg/mL (ref 211–911)

## 2015-12-15 NOTE — Progress Notes (Signed)
BP 136/86 mmHg  Pulse 72  Temp(Src) 98 F (36.7 C) (Oral)  Wt 219 lb 8 oz (99.565 kg)   CC: f/u visit  Subjective:    Patient ID: Frank Simpson, male    DOB: Jun 19, 1931, 82 y.o.   MRN: [id number]  HPI: Frank Simpson is a 82 y.o. male presenting on 12/15/2015 for Follow-up   DM - regularly does check sugars and brings log. Compliant with antihyperglycemic regimen which includes: basaglar (recent change due to insurance formulary) 38u daily in am, humalog 4u with breakfast, 8u with lunch/dinner + sliding scale. Pen works better - more exact measures, requests 90d supply. Denies low sugars or hypoglycemic symptoms.Denies paresthesias. Last diabetic eye exam 01/2014 - retinopathy. DUE. Pneumovax: 2010. Prevnar: 2015. Lab Results  Component Value Date   HGBA1C 8.6* 09/14/2015    Diabetic Foot Exam - Simple   No data filed    last foot exam 08/2015.  Latest sugar log (forgot today).  Fasting - 85-200, mostly low 100s (ok) Pre lunch - 99-300, mostly high 100s (ok) Pre dinner - 200-400s (worse)  Long acting 36u daily. Rapid acting averaging ~30/day  New Regimen -  Increase lantus to 38 units daily. Continue same mealtime - Humalog 4 units with breakfast, 8 units with lunch/dinner plus sliding scale: 150-200 2 units; 201-250 6 units; 251-300 10 units; >300 15 units  Has had some falls out of bed. Doing better since mattress pillow was removed.   Last B12 shot 12/01/2015. Will recheck today.  flomax caused worsening urinary incontinence.   Relevant past medical, surgical, family and social history reviewed and updated as indicated. Interim medical history since our last visit reviewed. Allergies and medications reviewed and updated. Current Outpatient Prescriptions on File Prior to Visit  Medication Sig  . acetaminophen (TYLENOL) 500 MG tablet Take 500 mg by mouth every 6 (six) hours as needed (pain).   [name] Kitchen amLODipine (NORVASC) 5 MG tablet Take 1 tablet (5 mg total) by mouth  daily.  [name] Kitchen aspirin 81 MG chewable tablet Chew 1 tablet (81 mg total) by mouth daily.  . cyanocobalamin (,VITAMIN B-12,) 1000 MCG/ML injection Inject 1 mL (1,000 mcg total) into the muscle every 30 (thirty) days.  . insulin lispro (HUMALOG) 100 UNIT/ML KiwkPen 4u with breakfast, 8u with lunch/dinner plus sliding scale, 38mosupply  . Insulin Pen Needle 31G X 8 MM MISC Use as directed with Lantus solostar. Dx E11.40.  .[name] KitchenInsulin Syringe-Needle U-100 (BD INSULIN SYRINGE ULTRAFINE) 31G X 5/16" 0.5 ML MISC Inject 4x a day  . isosorbide mononitrate (IMDUR) 30 MG 24 hr tablet Take 1 tablet (30 mg total) by mouth daily.  . meclizine (ANTIVERT) 25 MG tablet Take 25 mg by mouth every 6 (six) hours as needed for dizziness.  . nitroGLYCERIN (NITROSTAT) 0.4 MG SL tablet Place 1 tablet (0.4 mg total) under the tongue every 5 (five) minutes as needed for chest pain. Up to three doses,if pain continues call 911  . ONE TOUCH ULTRA TEST test strip CHECK FOUR TIMES DAILY,DX E11.319  . rosuvastatin (CRESTOR) 40 MG tablet Take 1 tablet (40 mg total) by mouth daily.  . sertraline (ZOLOFT) 50 MG tablet Take 1 tablet (50 mg total) by mouth daily.   No current facility-administered medications on file prior to visit.    Review of Systems Per HPI unless specifically indicated in ROS section     Objective:    BP 136/86 mmHg  Pulse 72  Temp(Src) 98 F (36.7 C) (  Oral)  Wt 219 lb 8 oz (99.565 kg)  Wt Readings from Last 3 Encounters:  12/15/15 219 lb 8 oz (99.565 kg)  10/26/15 219 lb 3.2 oz (99.428 kg)  10/08/15 215 lb 12.8 oz (97.886 kg)    Physical Exam  Constitutional: He appears well-developed and well-nourished. No distress.  Neck: Normal range of motion. Neck supple.  Cardiovascular: Normal rate, regular rhythm, normal heart sounds and intact distal pulses.   No murmur heard. Pulmonary/Chest: Effort normal and breath sounds normal. No respiratory distress. He has no wheezes. He has no rales.    Musculoskeletal: He exhibits no edema.  Lymphadenopathy:    He has no cervical adenopathy.  Skin: Skin is warm and dry. No rash noted.  Psychiatric: He has a normal mood and affect.  Nursing note and vitals reviewed.     Assessment & Plan:   Problem List Items Addressed This Visit    Diabetes mellitus type 2 with retinopathy ([place]) - Primary    Wife endorses improved control but still highs in evenings.  Check A1c. Wife will bring sugar log next week (forgot today). rec call to schedule eye exam.      Relevant Medications   Insulin Glargine (BASAGLAR KWIKPEN) 100 UNIT/ML Solostar Pen   Other Relevant Orders   Hemoglobin A1c   CKD stage 3 due to type 2 diabetes mellitus ([facility])   Relevant Medications   Insulin Glargine (BASAGLAR KWIKPEN) 100 UNIT/ML Solostar Pen   Other Relevant Orders   Renal function panel   Benign hypertension with CKD (chronic kidney disease) stage III    Chronic, stable. Continue current regimen.      B12 deficiency    Check labs today. Last B12 2 wks ago.      Relevant Orders   Vitamin B12       Follow up plan: Return in about 3 months (around 03/14/2016), or as needed, for follow up visit.

## 2015-12-15 NOTE — Patient Instructions (Addendum)
labwork today Bring log of sugars every 2 weeks.  Call to schedule eye exam as you're due.  I've sent in glucose tablets to have if low sugars.

## 2015-12-15 NOTE — Assessment & Plan Note (Signed)
Check labs today. Last B12 2 wks ago.

## 2015-12-15 NOTE — Assessment & Plan Note (Signed)
Chronic, stable. Continue current regimen.

## 2015-12-15 NOTE — Progress Notes (Signed)
Pre visit review using our clinic review tool, if applicable. No additional management support is needed unless otherwise documented below in the visit note.

## 2015-12-15 NOTE — Assessment & Plan Note (Signed)
Wife endorses improved control but still highs in evenings.  Check A1c. Wife will bring sugar log next week (forgot today). rec call to schedule eye exam.

## 2015-12-19 ENCOUNTER — Other Ambulatory Visit: Payer: Self-pay | Admitting: Family Medicine

## 2015-12-19 MED ORDER — CYANOCOBALAMIN 500 MCG PO TABS: 500.0000 ug | ORAL_TABLET | Freq: Every day | ORAL | Status: DC

## 2015-12-21 ENCOUNTER — Encounter: Payer: Self-pay | Admitting: *Deleted

## 2015-12-24 ENCOUNTER — Telehealth: Payer: Self-pay | Admitting: Family Medicine

## 2015-12-24 NOTE — Telephone Encounter (Signed)
In your IN box for review.

## 2015-12-24 NOTE — Telephone Encounter (Signed)
Wife dropped off readings for pt. Placing in rx tower Thank you

## 2015-12-28 NOTE — Telephone Encounter (Signed)
Patient's wife notified and verbalized understanding.

## 2015-12-28 NOTE — Telephone Encounter (Signed)
Latest sugar log: Fasting - 81-151 Pre lunch - 155-290 Pre dinner - 118-300 Long acting 38u daily. Rapid acting averaging ~30/day  New Regimen -  Increase lantus to 40 units daily. Continue same mealtime - Humalog 4 units with breakfast, 8 units with lunch/dinner plus sliding scale: 150-200 2 units; 201-250 6 units; 251-300 10 units; >300 15 units  Lab Results  Component Value Date   HGBA1C 8.4* 12/15/2015

## 2015-12-29 ENCOUNTER — Ambulatory Visit: Payer: Self-pay

## 2016-01-07 DIAGNOSIS — D1801 Hemangioma of skin and subcutaneous tissue: Secondary | ICD-10-CM | POA: Diagnosis not present

## 2016-01-07 DIAGNOSIS — L821 Other seborrheic keratosis: Secondary | ICD-10-CM | POA: Diagnosis not present

## 2016-01-07 DIAGNOSIS — D2272 Melanocytic nevi of left lower limb, including hip: Secondary | ICD-10-CM | POA: Diagnosis not present

## 2016-01-07 DIAGNOSIS — L570 Actinic keratosis: Secondary | ICD-10-CM | POA: Diagnosis not present

## 2016-01-07 DIAGNOSIS — D225 Melanocytic nevi of trunk: Secondary | ICD-10-CM | POA: Diagnosis not present

## 2016-01-07 DIAGNOSIS — Z85828 Personal history of other malignant neoplasm of skin: Secondary | ICD-10-CM | POA: Diagnosis not present

## 2016-01-07 DIAGNOSIS — L565 Disseminated superficial actinic porokeratosis (DSAP): Secondary | ICD-10-CM | POA: Diagnosis not present

## 2016-01-08 ENCOUNTER — Other Ambulatory Visit: Payer: Self-pay | Admitting: Cardiology

## 2016-01-12 DIAGNOSIS — Z961 Presence of intraocular lens: Secondary | ICD-10-CM | POA: Diagnosis not present

## 2016-01-12 DIAGNOSIS — E113291 Type 2 diabetes mellitus with mild nonproliferative diabetic retinopathy without macular edema, right eye: Secondary | ICD-10-CM | POA: Diagnosis not present

## 2016-01-12 DIAGNOSIS — E119 Type 2 diabetes mellitus without complications: Secondary | ICD-10-CM | POA: Diagnosis not present

## 2016-01-12 DIAGNOSIS — H40013 Open angle with borderline findings, low risk, bilateral: Secondary | ICD-10-CM | POA: Diagnosis not present

## 2016-01-12 LAB — HM DIABETES EYE EXAM

## 2016-01-23 ENCOUNTER — Encounter: Payer: Self-pay | Admitting: Family Medicine

## 2016-01-23 DIAGNOSIS — Z794 Long term (current) use of insulin: Principal | ICD-10-CM

## 2016-01-23 DIAGNOSIS — H35039 Hypertensive retinopathy, unspecified eye: Secondary | ICD-10-CM | POA: Insufficient documentation

## 2016-01-23 DIAGNOSIS — E113293 Type 2 diabetes mellitus with mild nonproliferative diabetic retinopathy without macular edema, bilateral: Secondary | ICD-10-CM

## 2016-01-24 ENCOUNTER — Encounter (HOSPITAL_COMMUNITY): Payer: Self-pay

## 2016-01-24 ENCOUNTER — Emergency Department (HOSPITAL_COMMUNITY)
Admission: EM | Admit: 2016-01-24 | Discharge: 2016-01-24 | Disposition: A | Discharge status: Home / Self Care | Payer: Medicare Other | Attending: Emergency Medicine | Admitting: Emergency Medicine

## 2016-01-24 ENCOUNTER — Emergency Department (HOSPITAL_COMMUNITY): Payer: Medicare Other

## 2016-01-24 DIAGNOSIS — Z9889 Other specified postprocedural states: Secondary | ICD-10-CM | POA: Diagnosis not present

## 2016-01-24 DIAGNOSIS — Z8619 Personal history of other infectious and parasitic diseases: Secondary | ICD-10-CM | POA: Diagnosis not present

## 2016-01-24 DIAGNOSIS — M25551 Pain in right hip: Secondary | ICD-10-CM | POA: Diagnosis not present

## 2016-01-24 DIAGNOSIS — Z8673 Personal history of transient ischemic attack (TIA), and cerebral infarction without residual deficits: Secondary | ICD-10-CM | POA: Insufficient documentation

## 2016-01-24 DIAGNOSIS — I129 Hypertensive chronic kidney disease with stage 1 through stage 4 chronic kidney disease, or unspecified chronic kidney disease: Secondary | ICD-10-CM | POA: Insufficient documentation

## 2016-01-24 DIAGNOSIS — Z85828 Personal history of other malignant neoplasm of skin: Secondary | ICD-10-CM | POA: Diagnosis not present

## 2016-01-24 DIAGNOSIS — N3001 Acute cystitis with hematuria: Secondary | ICD-10-CM | POA: Diagnosis not present

## 2016-01-24 DIAGNOSIS — M1990 Unspecified osteoarthritis, unspecified site: Secondary | ICD-10-CM | POA: Insufficient documentation

## 2016-01-24 DIAGNOSIS — Z8669 Personal history of other diseases of the nervous system and sense organs: Secondary | ICD-10-CM | POA: Insufficient documentation

## 2016-01-24 DIAGNOSIS — R319 Hematuria, unspecified: Secondary | ICD-10-CM | POA: Diagnosis present

## 2016-01-24 DIAGNOSIS — B9689 Other specified bacterial agents as the cause of diseases classified elsewhere: Secondary | ICD-10-CM | POA: Diagnosis not present

## 2016-01-24 DIAGNOSIS — Z7982 Long term (current) use of aspirin: Secondary | ICD-10-CM | POA: Insufficient documentation

## 2016-01-24 DIAGNOSIS — E11319 Type 2 diabetes mellitus with unspecified diabetic retinopathy without macular edema: Secondary | ICD-10-CM | POA: Insufficient documentation

## 2016-01-24 DIAGNOSIS — Z794 Long term (current) use of insulin: Secondary | ICD-10-CM | POA: Diagnosis not present

## 2016-01-24 DIAGNOSIS — Z862 Personal history of diseases of the blood and blood-forming organs and certain disorders involving the immune mechanism: Secondary | ICD-10-CM | POA: Insufficient documentation

## 2016-01-24 DIAGNOSIS — Z880 Allergy status to penicillin: Secondary | ICD-10-CM | POA: Diagnosis not present

## 2016-01-24 DIAGNOSIS — E785 Hyperlipidemia, unspecified: Secondary | ICD-10-CM | POA: Insufficient documentation

## 2016-01-24 DIAGNOSIS — N189 Chronic kidney disease, unspecified: Secondary | ICD-10-CM | POA: Diagnosis not present

## 2016-01-24 DIAGNOSIS — I2510 Atherosclerotic heart disease of native coronary artery without angina pectoris: Secondary | ICD-10-CM | POA: Insufficient documentation

## 2016-01-24 LAB — BASIC METABOLIC PANEL WITH GFR
Anion gap: 9 (ref 5–15)
BUN: 32 mg/dL — ABNORMAL HIGH (ref 6–20)
CO2: 24 mmol/L (ref 22–32)
Calcium: 8.6 mg/dL — ABNORMAL LOW (ref 8.9–10.3)
Chloride: 104 mmol/L (ref 101–111)
Creatinine, Ser: 2.19 mg/dL — ABNORMAL HIGH (ref 0.61–1.24)
GFR calc Af Amer: 30 mL/min — ABNORMAL LOW
GFR calc non Af Amer: 26 mL/min — ABNORMAL LOW
Glucose, Bld: 194 mg/dL — ABNORMAL HIGH (ref 65–99)
Potassium: 3.9 mmol/L (ref 3.5–5.1)
Sodium: 137 mmol/L (ref 135–145)

## 2016-01-24 LAB — URINALYSIS, ROUTINE W REFLEX MICROSCOPIC
Bilirubin Urine: NEGATIVE
Glucose, UA: 100 mg/dL — AB
Ketones, ur: NEGATIVE mg/dL
Nitrite: POSITIVE — AB
Protein, ur: >300 mg/dL — AB
Specific Gravity, Urine: 1.026 (ref 1.005–1.030)
pH: 5.5 (ref 5.0–8.0)

## 2016-01-24 LAB — CBC
HCT: 42.2 % (ref 39.0–52.0)
Hemoglobin: 14.8 g/dL (ref 13.0–17.0)
MCH: 31.6 pg (ref 26.0–34.0)
MCHC: 35.1 g/dL (ref 30.0–36.0)
MCV: 90.2 fL (ref 78.0–100.0)
Platelets: 150 10*3/uL (ref 150–400)
RBC: 4.68 MIL/uL (ref 4.22–5.81)
RDW: 13.4 % (ref 11.5–15.5)
WBC: 10.9 10*3/uL — ABNORMAL HIGH (ref 4.0–10.5)

## 2016-01-24 LAB — URINE MICROSCOPIC-ADD ON

## 2016-01-24 MED ORDER — CEPHALEXIN 500 MG PO CAPS
500.0000 mg | ORAL_CAPSULE | Freq: Once | ORAL | Status: AC
  Administered 2016-01-24: 500 mg via ORAL
  Filled 2016-01-24: qty 1

## 2016-01-24 MED ORDER — CEPHALEXIN 500 MG PO CAPS: 500.0000 mg | ORAL_CAPSULE | Freq: Three times a day (TID) | ORAL | Status: DC

## 2016-01-24 NOTE — ED Notes (Signed)
His wife tells [place] pt. Has been "falling a lot recently".  He had ?syncope? While shaving Thurs. And fell, landing on his left flank, which is ecchymotic.  He also c/o right hip area discomfort and feels as if he cannot completely empty his bladder.  His wife states pt. Has been incontinent of urine for quite some time now and has seen Dr. Rosana Hoes (urology) before and has an extant prescription from them for Flomax, which they have recently employed.  He is in no distress and can answer all questions appropriately, but seems to prefer to allow his wife to speak for him.
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
## 2016-01-24 NOTE — Discharge Instructions (Signed)
Acute Urinary Retention, Male Acute urinary retention is the temporary inability to urinate. This is a common problem in older men. As men age their prostates become larger and block the flow of urine from the bladder. This is usually a problem that has come on gradually.  HOME CARE INSTRUCTIONS If you are sent home with a Foley catheter and a drainage system, you will need to discuss the best course of action with your health care provider. While the catheter is in, maintain a good intake of fluids. Keep the drainage bag emptied and lower than your catheter. This is so that contaminated urine will not flow back into your bladder, which could lead to a urinary tract infection. There are two main types of drainage bags. One is a large bag that usually is used at night. It has a good capacity that will allow you to sleep through the night without having to empty it. The second type is called a leg bag. It has a smaller capacity, so it needs to be emptied more frequently. However, the main advantage is that it can be attached by a leg strap and can go underneath your clothing, allowing you the freedom to move about or leave your home. Only take over-the-counter or prescription medicines for pain, discomfort, or fever as directed by your health care provider.  SEEK MEDICAL CARE IF:  You develop a low-grade fever.  You experience spasms or leakage of urine with the spasms. SEEK IMMEDIATE MEDICAL CARE IF:   You develop chills or fever.  Your catheter stops draining urine.  Your catheter falls out.  You start to develop increased bleeding that does not respond to rest and increased fluid intake. MAKE SURE YOU:  Understand these instructions.  Will watch your condition.  Will get help right away if you are not doing well or get worse.   This information is not intended to replace advice given to you by your health care provider. Make sure you discuss any questions you have with your health care  provider.   Document Released: 02/20/2001 Document Revised: 03/31/2015 Document Reviewed: 04/25/2013 Elsevier Interactive Patient Education [employer].

## 2016-01-25 ENCOUNTER — Other Ambulatory Visit: Payer: Self-pay | Admitting: Family Medicine

## 2016-01-25 ENCOUNTER — Telehealth: Payer: Self-pay | Admitting: Family Medicine

## 2016-01-25 ENCOUNTER — Telehealth: Payer: Self-pay | Admitting: *Deleted

## 2016-01-25 ENCOUNTER — Ambulatory Visit (INDEPENDENT_AMBULATORY_CARE_PROVIDER_SITE_OTHER): Payer: Medicare Other | Admitting: *Deleted

## 2016-01-25 DIAGNOSIS — I442 Atrioventricular block, complete: Secondary | ICD-10-CM | POA: Diagnosis not present

## 2016-01-25 NOTE — Telephone Encounter (Signed)
Received latest sugar log: Fasting - 77-130 Pre lunch - 115-240 Pre dinner - 100-250 Long acting 40u daily. Rapid acting averaging ~25-30/day  Sugars are looking better! Continue current treatment plan. Lab Results  Component Value Date   HGBA1C 8.4* 12/15/2015

## 2016-01-25 NOTE — Telephone Encounter (Signed)
Patient's wife called for assistance with sending remote transmission via [place] monitor.  Advised patient's wife that monitor transmitted automatically overnight and that they do not need to do anything.  She verbalized understanding and appreciation.  Patient's wife denies additional questions at this time.

## 2016-01-25 NOTE — Telephone Encounter (Signed)
Patient's wife notified and verbalized understanding.

## 2016-01-25 NOTE — Progress Notes (Signed)
Remote pacemaker transmission.

## 2016-01-28 ENCOUNTER — Telehealth: Payer: Self-pay | Admitting: Family Medicine

## 2016-01-28 LAB — URINE CULTURE

## 2016-01-28 MED ORDER — ONETOUCH LANCETS MISC: Status: DC

## 2016-01-28 NOTE — Telephone Encounter (Signed)
recommend start allegra for sneezing/congestion/rhinorrhea. Recommend come in tomorrow for ER f/u visit and recheck then.

## 2016-01-28 NOTE — Telephone Encounter (Signed)
Patient's wife notified and verbalized understanding. Appt scheduled.

## 2016-01-28 NOTE — Telephone Encounter (Signed)
Patient Name: Frank Simpson  DOB: December 26, 1930    Initial Comment Caller states husband wants to know what he can take- sneezing coughing runny nose- currently on an abx for kidney infection- wants to know what he can take along with it    Nurse Assessment  Nurse: Raphael Gibney, RN, Vanita Ingles Date/Time (Eastern Time): 01/28/2016 11:45:38 AM  Confirm and document reason for call. If symptomatic, describe symptoms. You must click the next button to save text entered. ---Caller states spouse is sneezing and has nasal congestion. He is coughing. No fever. He is so weak he can not sit up. He went to the ER for kidney infection on Sunday and is taking antibiotics. He is drinking water. Has symptoms for 2 days. Blood sugar 200 this am.  Has the patient traveled out of the country within the last 30 days? ---Not Applicable  Does the patient have any new or worsening symptoms? ---Yes  Will a triage be completed? ---Yes  Related visit to physician within the last 2 weeks? ---No  Does the PT have any chronic conditions? (i.e. diabetes, asthma, etc.) ---Yes  List chronic conditions. ---diabetes; HTN  Is this a behavioral health or substance abuse call? ---No     Guidelines    Guideline Title Affirmed Question Affirmed Notes  Cough - Acute Non-Productive Difficulty breathing    Final Disposition User   Go to ED Now Raphael Gibney, RN, Vanita Ingles    Comments  Pt states he does not want to go back to the ER. Spouse wants to know what he can take for his cough and congestion.  Called back line and spoke to Rayna and advised her that pt went to the ER on Sunday and was diagnosed with a kidney infection. He is congested and coughing and having some difficulty breathing with ER outcome. He does not want to go back to the ER.   Referrals  GO TO FACILITY REFUSED   Disagree/Comply: Disagree  Disagree/Comply Reason: Disagree with instructions

## 2016-01-29 ENCOUNTER — Ambulatory Visit (INDEPENDENT_AMBULATORY_CARE_PROVIDER_SITE_OTHER): Payer: Medicare Other | Admitting: Family Medicine

## 2016-01-29 ENCOUNTER — Encounter: Payer: Self-pay | Admitting: Family Medicine

## 2016-01-29 ENCOUNTER — Telehealth (HOSPITAL_COMMUNITY): Payer: Self-pay

## 2016-01-29 VITALS — BP 128/76 | HR 76 | Temp 97.4°F | Wt 219.0 lb

## 2016-01-29 DIAGNOSIS — E1165 Type 2 diabetes mellitus with hyperglycemia: Secondary | ICD-10-CM

## 2016-01-29 DIAGNOSIS — N3001 Acute cystitis with hematuria: Secondary | ICD-10-CM

## 2016-01-29 DIAGNOSIS — R296 Repeated falls: Secondary | ICD-10-CM

## 2016-01-29 DIAGNOSIS — N183 Chronic kidney disease, stage 3 unspecified: Secondary | ICD-10-CM

## 2016-01-29 DIAGNOSIS — E1140 Type 2 diabetes mellitus with diabetic neuropathy, unspecified: Secondary | ICD-10-CM

## 2016-01-29 DIAGNOSIS — E1122 Type 2 diabetes mellitus with diabetic chronic kidney disease: Secondary | ICD-10-CM

## 2016-01-29 DIAGNOSIS — N3000 Acute cystitis without hematuria: Secondary | ICD-10-CM

## 2016-01-29 DIAGNOSIS — R413 Other amnesia: Secondary | ICD-10-CM

## 2016-01-29 DIAGNOSIS — W19XXXD Unspecified fall, subsequent encounter: Secondary | ICD-10-CM

## 2016-01-29 DIAGNOSIS — IMO0002 Reserved for concepts with insufficient information to code with codable children: Secondary | ICD-10-CM

## 2016-01-29 HISTORY — DX: Acute cystitis without hematuria: N30.00

## 2016-01-29 LAB — RENAL FUNCTION PANEL
Albumin: 3.3 g/dL — ABNORMAL LOW (ref 3.5–5.2)
BUN: 25 mg/dL — ABNORMAL HIGH (ref 6–23)
CHLORIDE: 103 meq/L (ref 96–112)
CO2: 26 meq/L (ref 19–32)
CREATININE: 1.79 mg/dL — AB (ref 0.40–1.50)
Calcium: 8.9 mg/dL (ref 8.4–10.5)
GFR: 38.73 mL/min — AB (ref >60.00)
Glucose, Bld: 224 mg/dL — ABNORMAL HIGH (ref 70–99)
PHOSPHORUS: 2.5 mg/dL (ref 2.3–4.6)
POTASSIUM: 3.9 meq/L (ref 3.5–5.1)
Sodium: 136 mEq/L (ref 135–145)

## 2016-01-29 LAB — CBC WITH DIFFERENTIAL/PLATELET
BASOS PCT: 0.4 % (ref 0.0–3.0)
Basophils Absolute: 0 10*3/uL (ref 0.0–0.1)
EOS PCT: 1.9 % (ref 0.0–5.0)
Eosinophils Absolute: 0.1 10*3/uL (ref 0.0–0.7)
HCT: 45.1 % (ref 39.0–52.0)
Hemoglobin: 15.3 g/dL (ref 13.0–17.0)
LYMPHS ABS: 1.9 10*3/uL (ref 0.7–4.0)
Lymphocytes Relative: 25.8 % (ref 12.0–46.0)
MCHC: 33.9 g/dL (ref 30.0–36.0)
MCV: 90 fl (ref 78.0–100.0)
MONO ABS: 0.7 10*3/uL (ref 0.1–1.0)
Monocytes Relative: 9.3 % (ref 3.0–12.0)
NEUTROS ABS: 4.5 10*3/uL (ref 1.4–7.7)
Neutrophils Relative %: 62.6 % (ref 43.0–77.0)
PLATELETS: 169 10*3/uL (ref 150.0–400.0)
RBC: 5.01 Mil/uL (ref 4.22–5.81)
RDW: 13.5 % (ref 11.5–15.5)
WBC: 7.2 10*3/uL (ref 4.0–10.5)

## 2016-01-29 LAB — CK: Total CK: 77 U/L (ref 7–232)

## 2016-01-29 MED ORDER — CEPHALEXIN 250 MG PO CAPS: 250.0000 mg | ORAL_CAPSULE | Freq: Two times a day (BID) | ORAL | Status: DC

## 2016-01-29 NOTE — Telephone Encounter (Signed)
Post ED Visit - Positive Culture Follow-up  Culture report reviewed by antimicrobial stewardship pharmacist:  _0  Elenor Quinones, Pharm.D. _1  Heide Guile, Pharm.D., BCPS _2  Parks Neptune, Pharm.D. _3  Alycia Rossetti, Pharm.D., BCPS _4  [place], [place].D., BCPS, AAHIVP _5  Legrand Como, Pharm.D., BCPS, AAHIVP _6  Milus Glazier, Pharm.D. _7  Stephens November, Pharm.D.  Positive urine culture Treated with cephalexin , organism sensitive to the same and no further patient follow-up is required at this time.  Ileene Musa 01/29/2016, 4:18 PM

## 2016-01-29 NOTE — Assessment & Plan Note (Signed)
UCx growing>100k  Ecoli sensitive to keflex Will renally dose keflex and finish course. New dose sent to pharmacy.

## 2016-01-29 NOTE — Assessment & Plan Note (Signed)
Concern for deterioration. Will renally dose keflex and start 26m BID for next 5 days to complete abx course. Check renal panel today.

## 2016-01-29 NOTE — Patient Instructions (Addendum)
Stop 521m keflex Start 2588mkeflex twice daily for 5 more days. We will refer you to home health for nurse and physical therapy. I will provide hospital bed prescription today.  Return in 2-3 weeks for follow up.

## 2016-01-29 NOTE — Assessment & Plan Note (Addendum)
Deteriorated control noted with recent UTI - finish treatment then reassess. No med changes today.

## 2016-01-29 NOTE — Progress Notes (Signed)
BP 128/76 mmHg  Pulse 76  Temp(Src) 97.4 F (36.3 C) (Oral)  Wt 219 lb (99.338 kg)   CC: ER f/u visit  Subjective:    Patient ID: Frank Simpson, male    DOB: Feb 01, 1931, 82 y.o.   MRN: [id number]  HPI: Frank Simpson is a 82 y.o. male presenting on 01/29/2016 for Follow-up   Here with wife and DIL.  Seen 1 wk ago at ER with hematuria and incomplete emptying not improved with flomax. 1 syncopal episode while shaving - fell backwards in bathroom. ER records reviewed. Dx with acute cystitis with hematuria, treated with keflex 580m TID x7 days. Worsening weakness.  WBC 10.9, UA with infection (large blood, pos nitr, sm LE), Cr 2.19 (GRF 26).  Ambulatory with walker at home.   Struggling at home - struggling to get in and out of bed - wife and and daughter. Requests hospital bed. Requests HH.   Recently started allegra for congestion/rhinorrhea.   Received latest sugar log: Fasting - 77-130 Pre lunch - 115-240 Pre dinner - 100-250 Long acting 40u daily. Rapid acting averaging ~25-30/day Lab Results  Component Value Date   HGBA1C 8.4* 12/15/2015   Brings new log over last week -  Fasting 200s, pre-lunch 150-250, pre-dinner 200-300s  Relevant past medical, surgical, family and social history reviewed and updated as indicated. Interim medical history since our last visit reviewed. Allergies and medications reviewed and updated. Current Outpatient Prescriptions on File Prior to Visit  Medication Sig  . acetaminophen (TYLENOL) 500 MG tablet Take 500 mg by mouth every 6 (six) hours as needed (pain).   .[name] KitchenamLODipine (NORVASC) 5 MG tablet Take 1 tablet (5 mg total) by mouth daily.  .[name] Kitchenaspirin 81 MG chewable tablet Chew 1 tablet (81 mg total) by mouth daily.  . cyanocobalamin 500 MCG tablet Take 1 tablet (500 mcg total) by mouth daily.  .[name] KitchenHUMALOG 100 UNIT/ML injection INJECT PER SLIDING SCALE ASDIRECTED 5-15 UNITS 3 TIMESA DAY BEFORE MEALS  . Insulin Glargine (BASAGLAR KWIKPEN) 100  UNIT/ML Solostar Pen Inject 40 Units into the skin daily.  . insulin lispro (HUMALOG) 100 UNIT/ML KiwkPen 4u with breakfast, 8u with lunch/dinner plus sliding scale, 310moupply (Patient taking differently: Inject 4-8 Units into the skin 3 (three) times daily. 4u with breakfast, 8u with lunch/dinner plus sliding scale, 78m16mopply)  . Insulin Pen Needle 31G X 8 MM MISC Use as directed with Lantus solostar. Dx E11.40.  . IMarland Kitchensulin Syringe-Needle U-100 (BD INSULIN SYRINGE ULTRAFINE) 31G X 5/16" 0.5 ML MISC Inject 4x a day  . isosorbide mononitrate (IMDUR) 30 MG 24 hr tablet Take 1 tablet (30 mg total) by mouth daily.  . meclizine (ANTIVERT) 25 MG tablet Take 25 mg by mouth every 6 (six) hours as needed for dizziness.  . nitroGLYCERIN (NITROSTAT) 0.4 MG SL tablet Place 1 tablet (0.4 mg total) under the tongue every 5 (five) minutes as needed for chest pain. Up to three doses,if pain continues call 911  . ONE TOUCH LANCETS MISC Use to check sugar four times daily and as needed. Dx E11.40  . ONE TOUCH ULTRA TEST test strip CHECK FOUR TIMES DAILY,DX E11.319  . rosuvastatin (CRESTOR) 40 MG tablet Take 1 tablet (40 mg total) by mouth daily.  . sertraline (ZOLOFT) 50 MG tablet Take 1 tablet (50 mg total) by mouth daily.   No current facility-administered medications on file prior to visit.    Review of Systems Per HPI unless specifically indicated in  ROS section     Objective:    BP 128/76 mmHg  Pulse 76  Temp(Src) 97.4 F (36.3 C) (Oral)  Wt 219 lb (99.338 kg)  Wt Readings from Last 3 Encounters:  01/29/16 219 lb (99.338 kg)  12/15/15 219 lb 8 oz (99.565 kg)  10/26/15 219 lb 3.2 oz (99.428 kg)    Physical Exam  Constitutional: He appears well-developed and well-nourished. No distress.  In wheelchair  HENT:  Mouth/Throat: Oropharynx is clear and moist. No oropharyngeal exudate.  Eyes: Conjunctivae and EOM are normal. Pupils are equal, round, and reactive to light. No scleral icterus.    Cardiovascular: Normal rate, regular rhythm, normal heart sounds and intact distal pulses.   No murmur heard. Pulmonary/Chest: Effort normal and breath sounds normal. No respiratory distress. He has no wheezes. He has no rales.  Abdominal: Soft. Bowel sounds are normal. He exhibits no distension and no mass. There is no tenderness. There is no rebound and no guarding.  Musculoskeletal: He exhibits no edema.  Skin: Skin is warm and dry. No rash noted.  Psychiatric: He has a normal mood and affect.  Nursing note and vitals reviewed.  Results for orders placed or performed during the hospital encounter of 01/24/16  Urine culture  Result Value Ref Range   Specimen Description URINE, CLEAN CATCH    Special Requests NONE    Culture      >=100,000 COLONIES/mL ESCHERICHIA COLI Performed at [facility]    Report Status 01/28/2016 FINAL    Organism ID, Bacteria ESCHERICHIA COLI       Susceptibility   Escherichia coli - MIC*    AMPICILLIN 4 SENSITIVE Sensitive     CEFAZOLIN <=4 SENSITIVE Sensitive     CEFTRIAXONE <=1 SENSITIVE Sensitive     CIPROFLOXACIN <=0.25 SENSITIVE Sensitive     GENTAMICIN <=1 SENSITIVE Sensitive     IMIPENEM <=0.25 SENSITIVE Sensitive     NITROFURANTOIN <=16 SENSITIVE Sensitive     TRIMETH/SULFA <=20 SENSITIVE Sensitive     AMPICILLIN/SULBACTAM <=2 SENSITIVE Sensitive     PIP/TAZO <=4 SENSITIVE Sensitive     * >=100,000 COLONIES/mL ESCHERICHIA COLI  CBC  Result Value Ref Range   WBC 10.9 (H) 4.0 - 10.5 K/uL   RBC 4.68 4.22 - 5.81 MIL/uL   Hemoglobin 14.8 13.0 - 17.0 g/dL   HCT 42.2 39.0 - 52.0 %   MCV 90.2 78.0 - 100.0 fL   MCH 31.6 26.0 - 34.0 pg   MCHC 35.1 30.0 - 36.0 g/dL   RDW 13.4 11.5 - 15.5 %   Platelets 150 150 - 400 K/uL  Basic metabolic panel  Result Value Ref Range   Sodium 137 135 - 145 mmol/L   Potassium 3.9 3.5 - 5.1 mmol/L   Chloride 104 101 - 111 mmol/L   CO2 24 22 - 32 mmol/L   Glucose, Bld 194 (H) 65 - 99 mg/dL   BUN 32 (H)  6 - 20 mg/dL   Creatinine, Ser 2.19 (H) 0.61 - 1.24 mg/dL   Calcium 8.6 (L) 8.9 - 10.3 mg/dL   GFR calc non Af Amer 26 (L) >60 mL/min   GFR calc Af Amer 30 (L) >60 mL/min   Anion gap 9 5 - 15  Urinalysis, Routine w reflex microscopic (not at [facility])  Result Value Ref Range   Color, Urine BIOCHEMICALS MAY BE AFFECTED BY COLOR (A) YELLOW   APPearance CLOUDY (A) CLEAR   Specific Gravity, Urine 1.026 1.005 - 1.030   pH  5.5 5.0 - 8.0   Glucose, UA 100 (A) NEGATIVE mg/dL   Hgb urine dipstick LARGE (A) NEGATIVE   Bilirubin Urine NEGATIVE NEGATIVE   Ketones, ur NEGATIVE NEGATIVE mg/dL   Protein, ur >300 (A) NEGATIVE mg/dL   Nitrite POSITIVE (A) NEGATIVE   Leukocytes, UA SMALL (A) NEGATIVE  Urine microscopic-add on  Result Value Ref Range   Squamous Epithelial / LPF 6-30 (A) NONE SEEN   WBC, UA TOO NUMEROUS TO COUNT 0 - 5 WBC/hpf   RBC / HPF TOO NUMEROUS TO COUNT 0 - 5 RBC/hpf   Bacteria, UA MANY (A) NONE SEEN   Urine-Other URINALYSIS PERFORMED ON SUPERNATANT       Assessment & Plan:   Problem List Items Addressed This Visit    Type 2 diabetes, uncontrolled, with neuropathy ([place])    Deteriorated control noted with recent UTI - finish treatment then reassess. No med changes today.      Relevant Medications   fexofenadine (ALLEGRA) 180 MG tablet   cephALEXin (KEFLEX) 250 MG capsule   Other Relevant Orders   CBC with Differential/Platelet   Renal function panel   Ambulatory referral to [place]   Memory loss    Ongoing trouble noted, recently worse by family. ?infection contributing.      Frequent falls    Worsening noted recently. ?infection contributing. Will provide Rx for hospital bed, will ask HH to evaluate and assist.  Discussed briefly rehab stay and home safety. DIL resistant to this.       Relevant Medications   fexofenadine (ALLEGRA) 180 MG tablet   cephALEXin (KEFLEX) 250 MG capsule   CKD stage 3 due to type 2 diabetes mellitus ([place])    Concern for  deterioration. Will renally dose keflex and start 223m BID for next 5 days to complete abx course. Check renal panel today.      Relevant Medications   fexofenadine (ALLEGRA) 180 MG tablet   cephALEXin (KEFLEX) 250 MG capsule   Other Relevant Orders   CBC with Differential/Platelet   Renal function panel   [place]   Acute cystitis with hematuria - Primary    UCx growing>100k  Ecoli sensitive to keflex Will renally dose keflex and finish course. New dose sent to pharmacy.      Relevant Medications   fexofenadine (ALLEGRA) 180 MG tablet   cephALEXin (KEFLEX) 250 MG capsule   Other Relevant Orders   CBC with Differential/Platelet   Renal function panel   [place]      Follow up plan: Return in about 3 weeks (around 02/19/2016), or as needed, for follow up visit.

## 2016-01-29 NOTE — Progress Notes (Signed)
Pre visit review using our clinic review tool, if applicable. No additional management support is needed unless otherwise documented below in the visit note.

## 2016-01-29 NOTE — Assessment & Plan Note (Signed)
Ongoing trouble noted, recently worse by family. ?infection contributing.

## 2016-01-29 NOTE — Assessment & Plan Note (Signed)
Worsening noted recently. ?infection contributing. Will provide Rx for hospital bed, will ask HH to evaluate and assist.  Discussed briefly rehab stay and home safety. DIL resistant to this.

## 2016-01-30 ENCOUNTER — Other Ambulatory Visit: Payer: Self-pay | Admitting: Cardiology

## 2016-02-02 ENCOUNTER — Ambulatory Visit: Payer: Self-pay | Admitting: Family Medicine

## 2016-02-02 DIAGNOSIS — B9620 Unspecified Escherichia coli [E. coli] as the cause of diseases classified elsewhere: Secondary | ICD-10-CM | POA: Diagnosis not present

## 2016-02-02 DIAGNOSIS — I25119 Atherosclerotic heart disease of native coronary artery with unspecified angina pectoris: Secondary | ICD-10-CM | POA: Diagnosis not present

## 2016-02-02 DIAGNOSIS — E1165 Type 2 diabetes mellitus with hyperglycemia: Secondary | ICD-10-CM | POA: Diagnosis not present

## 2016-02-02 DIAGNOSIS — N3091 Cystitis, unspecified with hematuria: Secondary | ICD-10-CM | POA: Diagnosis not present

## 2016-02-02 DIAGNOSIS — I110 Hypertensive heart disease with heart failure: Secondary | ICD-10-CM | POA: Diagnosis not present

## 2016-02-02 DIAGNOSIS — E1142 Type 2 diabetes mellitus with diabetic polyneuropathy: Secondary | ICD-10-CM | POA: Diagnosis not present

## 2016-02-04 DIAGNOSIS — I25119 Atherosclerotic heart disease of native coronary artery with unspecified angina pectoris: Secondary | ICD-10-CM | POA: Diagnosis not present

## 2016-02-04 DIAGNOSIS — E1165 Type 2 diabetes mellitus with hyperglycemia: Secondary | ICD-10-CM | POA: Diagnosis not present

## 2016-02-04 DIAGNOSIS — I110 Hypertensive heart disease with heart failure: Secondary | ICD-10-CM | POA: Diagnosis not present

## 2016-02-04 DIAGNOSIS — N3091 Cystitis, unspecified with hematuria: Secondary | ICD-10-CM | POA: Diagnosis not present

## 2016-02-04 DIAGNOSIS — E1142 Type 2 diabetes mellitus with diabetic polyneuropathy: Secondary | ICD-10-CM | POA: Diagnosis not present

## 2016-02-04 DIAGNOSIS — B9620 Unspecified Escherichia coli [E. coli] as the cause of diseases classified elsewhere: Secondary | ICD-10-CM | POA: Diagnosis not present

## 2016-02-07 ENCOUNTER — Other Ambulatory Visit: Payer: Self-pay | Admitting: Family Medicine

## 2016-02-08 DIAGNOSIS — B9620 Unspecified Escherichia coli [E. coli] as the cause of diseases classified elsewhere: Secondary | ICD-10-CM | POA: Diagnosis not present

## 2016-02-08 DIAGNOSIS — N3091 Cystitis, unspecified with hematuria: Secondary | ICD-10-CM | POA: Diagnosis not present

## 2016-02-08 DIAGNOSIS — E1142 Type 2 diabetes mellitus with diabetic polyneuropathy: Secondary | ICD-10-CM | POA: Diagnosis not present

## 2016-02-08 DIAGNOSIS — I110 Hypertensive heart disease with heart failure: Secondary | ICD-10-CM | POA: Diagnosis not present

## 2016-02-08 DIAGNOSIS — E1165 Type 2 diabetes mellitus with hyperglycemia: Secondary | ICD-10-CM | POA: Diagnosis not present

## 2016-02-08 DIAGNOSIS — I25119 Atherosclerotic heart disease of native coronary artery with unspecified angina pectoris: Secondary | ICD-10-CM | POA: Diagnosis not present

## 2016-02-09 DIAGNOSIS — E1142 Type 2 diabetes mellitus with diabetic polyneuropathy: Secondary | ICD-10-CM | POA: Diagnosis not present

## 2016-02-09 DIAGNOSIS — B9620 Unspecified Escherichia coli [E. coli] as the cause of diseases classified elsewhere: Secondary | ICD-10-CM | POA: Diagnosis not present

## 2016-02-09 DIAGNOSIS — I110 Hypertensive heart disease with heart failure: Secondary | ICD-10-CM | POA: Diagnosis not present

## 2016-02-09 DIAGNOSIS — I25119 Atherosclerotic heart disease of native coronary artery with unspecified angina pectoris: Secondary | ICD-10-CM | POA: Diagnosis not present

## 2016-02-09 DIAGNOSIS — E1165 Type 2 diabetes mellitus with hyperglycemia: Secondary | ICD-10-CM | POA: Diagnosis not present

## 2016-02-09 DIAGNOSIS — N3091 Cystitis, unspecified with hematuria: Secondary | ICD-10-CM | POA: Diagnosis not present

## 2016-02-11 ENCOUNTER — Encounter: Payer: Self-pay | Admitting: Family Medicine

## 2016-02-11 ENCOUNTER — Ambulatory Visit (INDEPENDENT_AMBULATORY_CARE_PROVIDER_SITE_OTHER): Payer: Medicare Other | Admitting: Family Medicine

## 2016-02-11 VITALS — BP 134/82 | HR 72 | Temp 97.5°F | Wt 218.5 lb

## 2016-02-11 DIAGNOSIS — N3001 Acute cystitis with hematuria: Secondary | ICD-10-CM

## 2016-02-11 DIAGNOSIS — S0181XA Laceration without foreign body of other part of head, initial encounter: Secondary | ICD-10-CM

## 2016-02-11 DIAGNOSIS — E1165 Type 2 diabetes mellitus with hyperglycemia: Secondary | ICD-10-CM | POA: Diagnosis not present

## 2016-02-11 DIAGNOSIS — E1140 Type 2 diabetes mellitus with diabetic neuropathy, unspecified: Secondary | ICD-10-CM | POA: Diagnosis not present

## 2016-02-11 DIAGNOSIS — IMO0002 Reserved for concepts with insufficient information to code with codable children: Secondary | ICD-10-CM

## 2016-02-11 NOTE — Patient Instructions (Addendum)
Continue basaglar 40units in am for next week. Then if night time sugars average >250, increase basaglar by 2 units every 3 days to total 46units daily. Continue humalog as up to now.  Good to see you today, call us with questions.  Keep April appointment and we will check labs at that time.

## 2016-02-11 NOTE — Assessment & Plan Note (Signed)
Completed treatment and overall improved.

## 2016-02-11 NOTE — Progress Notes (Signed)
Pre visit review using our clinic review tool, if applicable. No additional management support is needed unless otherwise documented below in the visit note.

## 2016-02-11 NOTE — Progress Notes (Signed)
BP 134/82 mmHg  Pulse 72  Temp(Src) 97.5 F (36.4 C) (Oral)  Wt 218 lb 8 oz (99.111 kg)   CC: 2-3 mo f/u visit  Subjective:    Patient ID: Frank Simpson, male    DOB: 05/14/1931, 82 y.o.   MRN: [id number]  HPI: DEMETREUS LOTHAMER is a 82 y.o. male presenting on 02/11/2016 for Follow-up   Seen last month after ER visit with fall and acute cystitis, sxs improving. We prescribed hospital bed for increased difficulty with mobility at home and referred to [facility]. RN and PT have come out, improved a lot. Strength improving as well.   Yesterday morning fell out of bed and hit R temple with nightstand. Did bleed throughout the day but was able to gt up on his own.  DM - regularly does check sugars and brings log which was reviewed. Compliant with antihyperglycemic regimen which includes: basaglar 40u daily + humalog 25-30/daily. Denies low sugars or hypoglycemic symptoms.  Denies paresthesias. Last diabetic eye exam 12/2015.  Pneumovax: 2010.  Prevnar: 2015. Lab Results  Component Value Date   HGBA1C 8.4* 12/15/2015   Diabetic Foot Exam - Simple   No data filed    last foot exam 08/2015.  Sugar log: Fasting 110-180 Pre lunch 170-250 Pre dinner 250-300s   Relevant past medical, surgical, family and social history reviewed and updated as indicated. Interim medical history since our last visit reviewed. Allergies and medications reviewed and updated. Current Outpatient Prescriptions on File Prior to Visit  Medication Sig  . acetaminophen (TYLENOL) 500 MG tablet Take 500 mg by mouth every 6 (six) hours as needed (pain).   [name] Kitchen amLODipine (NORVASC) 5 MG tablet Take 1 tablet (5 mg total) by mouth daily.  [name] Kitchen aspirin 81 MG chewable tablet Chew 1 tablet (81 mg total) by mouth daily.  . fexofenadine (ALLEGRA) 180 MG tablet Take 180 mg by mouth daily.  [name] Kitchen glucose blood (ONE TOUCH ULTRA TEST) test strip Use to check sugar 4 times daily. Dx: E11.40  . HUMALOG 100 UNIT/ML injection INJECT PER SLIDING  SCALE ASDIRECTED 5-15 UNITS 3 TIMESA DAY BEFORE MEALS  . Insulin Glargine (BASAGLAR KWIKPEN) 100 UNIT/ML Solostar Pen Inject 40 Units into the skin daily.  . insulin lispro (HUMALOG) 100 UNIT/ML KiwkPen 4u with breakfast, 8u with lunch/dinner plus sliding scale, 35mosupply (Patient taking differently: Inject 4-8 Units into the skin 3 (three) times daily. 4u with breakfast, 8u with lunch/dinner plus sliding scale, 366moupply)  . Insulin Pen Needle 31G X 8 MM MISC Use as directed with Lantus solostar. Dx E11.40.  . [name] Kitchennsulin Syringe-Needle U-100 (BD INSULIN SYRINGE ULTRAFINE) 31G X 5/16" 0.5 ML MISC Inject 4x a day  . isosorbide mononitrate (IMDUR) 30 MG 24 hr tablet Take 1 tablet (30 mg total) by mouth daily.  . meclizine (ANTIVERT) 25 MG tablet Take 25 mg by mouth every 6 (six) hours as needed for dizziness.  . nitroGLYCERIN (NITROSTAT) 0.4 MG SL tablet Place 1 tablet (0.4 mg total) under the tongue every 5 (five) minutes as needed for chest pain. Up to three doses,if pain continues call 911  . ONE TOUCH LANCETS MISC Use to check sugar four times daily and as needed. Dx E11.40  . rosuvastatin (CRESTOR) 40 MG tablet Take 1 tablet (40 mg total) by mouth daily.  . sertraline (ZOLOFT) 50 MG tablet Take 1 tablet (50 mg total) by mouth daily.  . cyanocobalamin 500 MCG tablet Take 1 tablet (500 mcg total) by mouth  daily. (Patient not taking: Reported on 02/11/2016)   No current facility-administered medications on file prior to visit.    Review of Systems Per HPI unless specifically indicated in ROS section     Objective:    BP 134/82 mmHg  Pulse 72  Temp(Src) 97.5 F (36.4 C) (Oral)  Wt 218 lb 8 oz (99.111 kg)  Wt Readings from Last 3 Encounters:  02/11/16 218 lb 8 oz (99.111 kg)  01/29/16 219 lb (99.338 kg)  12/15/15 219 lb 8 oz (99.565 kg)    Physical Exam  Constitutional: He appears well-developed and well-nourished. No distress.  HENT:  Head:    Mouth/Throat: Oropharynx is clear and  moist. No oropharyngeal exudate.  R forehead laceration ~2cm length without surrounding erythema or drainage. Skin scabbed over.  Eyes: Conjunctivae and EOM are normal. Pupils are equal, round, and reactive to light. No scleral icterus.  Neck: Normal range of motion. Neck supple.  Cardiovascular: Normal rate, regular rhythm, normal heart sounds and intact distal pulses.   No murmur heard. Pulmonary/Chest: Effort normal and breath sounds normal. No respiratory distress. He has no wheezes. He has no rales.  Musculoskeletal: He exhibits no edema.  Psychiatric: He has a normal mood and affect.  Nursing note and vitals reviewed.  Results for orders placed or performed in visit on 01/29/16  CBC with Differential/Platelet  Result Value Ref Range   WBC 7.2 4.0 - 10.5 K/uL   RBC 5.01 4.22 - 5.81 Mil/uL   Hemoglobin 15.3 13.0 - 17.0 g/dL   HCT 45.1 39.0 - 52.0 %   MCV 90.0 78.0 - 100.0 fl   MCHC 33.9 30.0 - 36.0 g/dL   RDW 13.5 11.5 - 15.5 %   Platelets 169.0 150.0 - 400.0 K/uL   Neutrophils Relative % 62.6 43.0 - 77.0 %   Lymphocytes Relative 25.8 12.0 - 46.0 %   Monocytes Relative 9.3 3.0 - 12.0 %   Eosinophils Relative 1.9 0.0 - 5.0 %   Basophils Relative 0.4 0.0 - 3.0 %   Neutro Abs 4.5 1.4 - 7.7 K/uL   Lymphs Abs 1.9 0.7 - 4.0 K/uL   Monocytes Absolute 0.7 0.1 - 1.0 K/uL   Eosinophils Absolute 0.1 0.0 - 0.7 K/uL   Basophils Absolute 0.0 0.0 - 0.1 K/uL  Renal function panel  Result Value Ref Range   Sodium 136 135 - 145 mEq/L   Potassium 3.9 3.5 - 5.1 mEq/L   Chloride 103 96 - 112 mEq/L   CO2 26 19 - 32 mEq/L   Calcium 8.9 8.4 - 10.5 mg/dL   Albumin 3.3 (L) 3.5 - 5.2 g/dL   BUN 25 (H) 6 - 23 mg/dL   Creatinine, Ser 1.79 (H) 0.40 - 1.50 mg/dL   Glucose, Bld 224 (H) 70 - 99 mg/dL   Phosphorus 2.5 2.3 - 4.6 mg/dL   GFR 38.73 (L) >60.00 mL/min  CK  Result Value Ref Range   Total CK 77 7 - 232 U/L      Assessment & Plan:   Problem List Items Addressed This Visit    Type 2  diabetes, uncontrolled, with neuropathy ([place]) - Primary    Improvement s/p cystitis treatment. Reviewed log. Just started basaglar. Will continue x1 wk and if persistently elevated PM readings, will slowly taper basaglar to max 46u daily. See pt instructions. Pt and wife agree with plan.      Forehead laceration    Fall occurred >24 hrs ago, outside of window to suture.  No signs of infection. Will continue to dress daily.  Advised move nightstand. They have already ordered rail for bed. HH said pt did not qualify for hospital bed.  rec seek urgent care if recurrent fall.      Acute cystitis with hematuria    Completed treatment and overall improved.          Follow up plan: Return in about 2 months (around 04/12/2016), or as needed, for follow up visit.

## 2016-02-11 NOTE — Assessment & Plan Note (Signed)
Improvement s/p cystitis treatment. Reviewed log. Just started basaglar. Will continue x1 wk and if persistently elevated PM readings, will slowly taper basaglar to max 46u daily. See pt instructions. Pt and wife agree with plan.

## 2016-02-11 NOTE — Assessment & Plan Note (Signed)
Fall occurred >24 hrs ago, outside of window to suture.  No signs of infection. Will continue to dress daily.  Advised move nightstand. They have already ordered rail for bed. HH said pt did not qualify for hospital bed.  rec seek urgent care if recurrent fall.

## 2016-02-12 DIAGNOSIS — N3091 Cystitis, unspecified with hematuria: Secondary | ICD-10-CM | POA: Diagnosis not present

## 2016-02-12 DIAGNOSIS — E1142 Type 2 diabetes mellitus with diabetic polyneuropathy: Secondary | ICD-10-CM | POA: Diagnosis not present

## 2016-02-12 DIAGNOSIS — E1165 Type 2 diabetes mellitus with hyperglycemia: Secondary | ICD-10-CM | POA: Diagnosis not present

## 2016-02-12 DIAGNOSIS — I25119 Atherosclerotic heart disease of native coronary artery with unspecified angina pectoris: Secondary | ICD-10-CM | POA: Diagnosis not present

## 2016-02-12 DIAGNOSIS — B9620 Unspecified Escherichia coli [E. coli] as the cause of diseases classified elsewhere: Secondary | ICD-10-CM | POA: Diagnosis not present

## 2016-02-12 DIAGNOSIS — I110 Hypertensive heart disease with heart failure: Secondary | ICD-10-CM | POA: Diagnosis not present

## 2016-02-16 ENCOUNTER — Emergency Department (HOSPITAL_COMMUNITY)
Admission: EM | Admit: 2016-02-16 | Discharge: 2016-02-16 | Disposition: A | Discharge status: Home / Self Care | Payer: Medicare Other | Attending: Emergency Medicine | Admitting: Emergency Medicine

## 2016-02-16 ENCOUNTER — Emergency Department (HOSPITAL_COMMUNITY): Payer: Medicare Other

## 2016-02-16 ENCOUNTER — Encounter (HOSPITAL_COMMUNITY): Payer: Self-pay

## 2016-02-16 DIAGNOSIS — W19XXXA Unspecified fall, initial encounter: Secondary | ICD-10-CM

## 2016-02-16 DIAGNOSIS — S61219A Laceration without foreign body of unspecified finger without damage to nail, initial encounter: Secondary | ICD-10-CM | POA: Insufficient documentation

## 2016-02-16 DIAGNOSIS — S61214A Laceration without foreign body of right ring finger without damage to nail, initial encounter: Secondary | ICD-10-CM | POA: Diagnosis not present

## 2016-02-16 DIAGNOSIS — S61012A Laceration without foreign body of left thumb without damage to nail, initial encounter: Secondary | ICD-10-CM | POA: Diagnosis not present

## 2016-02-16 DIAGNOSIS — W1789XA Other fall from one level to another, initial encounter: Secondary | ICD-10-CM | POA: Insufficient documentation

## 2016-02-16 DIAGNOSIS — T148 Other injury of unspecified body region: Secondary | ICD-10-CM | POA: Diagnosis not present

## 2016-02-16 DIAGNOSIS — E785 Hyperlipidemia, unspecified: Secondary | ICD-10-CM | POA: Diagnosis not present

## 2016-02-16 DIAGNOSIS — S61212A Laceration without foreign body of right middle finger without damage to nail, initial encounter: Secondary | ICD-10-CM | POA: Diagnosis not present

## 2016-02-16 DIAGNOSIS — Z8673 Personal history of transient ischemic attack (TIA), and cerebral infarction without residual deficits: Secondary | ICD-10-CM | POA: Diagnosis not present

## 2016-02-16 DIAGNOSIS — S0081XA Abrasion of other part of head, initial encounter: Secondary | ICD-10-CM | POA: Insufficient documentation

## 2016-02-16 DIAGNOSIS — N189 Chronic kidney disease, unspecified: Secondary | ICD-10-CM | POA: Insufficient documentation

## 2016-02-16 DIAGNOSIS — S0990XA Unspecified injury of head, initial encounter: Secondary | ICD-10-CM | POA: Diagnosis not present

## 2016-02-16 DIAGNOSIS — Z950 Presence of cardiac pacemaker: Secondary | ICD-10-CM | POA: Insufficient documentation

## 2016-02-16 DIAGNOSIS — S61216A Laceration without foreign body of right little finger without damage to nail, initial encounter: Secondary | ICD-10-CM | POA: Diagnosis not present

## 2016-02-16 DIAGNOSIS — Z8619 Personal history of other infectious and parasitic diseases: Secondary | ICD-10-CM | POA: Diagnosis not present

## 2016-02-16 DIAGNOSIS — Z880 Allergy status to penicillin: Secondary | ICD-10-CM | POA: Diagnosis not present

## 2016-02-16 DIAGNOSIS — Z862 Personal history of diseases of the blood and blood-forming organs and certain disorders involving the immune mechanism: Secondary | ICD-10-CM | POA: Insufficient documentation

## 2016-02-16 DIAGNOSIS — Z9889 Other specified postprocedural states: Secondary | ICD-10-CM | POA: Insufficient documentation

## 2016-02-16 DIAGNOSIS — Z9841 Cataract extraction status, right eye: Secondary | ICD-10-CM | POA: Insufficient documentation

## 2016-02-16 DIAGNOSIS — Z86018 Personal history of other benign neoplasm: Secondary | ICD-10-CM | POA: Diagnosis not present

## 2016-02-16 DIAGNOSIS — Z85828 Personal history of other malignant neoplasm of skin: Secondary | ICD-10-CM | POA: Diagnosis not present

## 2016-02-16 DIAGNOSIS — Y9289 Other specified places as the place of occurrence of the external cause: Secondary | ICD-10-CM | POA: Diagnosis not present

## 2016-02-16 DIAGNOSIS — Y9389 Activity, other specified: Secondary | ICD-10-CM | POA: Diagnosis not present

## 2016-02-16 DIAGNOSIS — S6991XA Unspecified injury of right wrist, hand and finger(s), initial encounter: Secondary | ICD-10-CM | POA: Diagnosis not present

## 2016-02-16 DIAGNOSIS — Z23 Encounter for immunization: Secondary | ICD-10-CM | POA: Diagnosis not present

## 2016-02-16 DIAGNOSIS — I129 Hypertensive chronic kidney disease with stage 1 through stage 4 chronic kidney disease, or unspecified chronic kidney disease: Secondary | ICD-10-CM | POA: Insufficient documentation

## 2016-02-16 DIAGNOSIS — Z9861 Coronary angioplasty status: Secondary | ICD-10-CM | POA: Diagnosis not present

## 2016-02-16 DIAGNOSIS — E11319 Type 2 diabetes mellitus with unspecified diabetic retinopathy without macular edema: Secondary | ICD-10-CM | POA: Insufficient documentation

## 2016-02-16 DIAGNOSIS — Z9842 Cataract extraction status, left eye: Secondary | ICD-10-CM | POA: Diagnosis not present

## 2016-02-16 DIAGNOSIS — T07 Unspecified multiple injuries: Secondary | ICD-10-CM | POA: Diagnosis not present

## 2016-02-16 DIAGNOSIS — T148XXA Other injury of unspecified body region, initial encounter: Secondary | ICD-10-CM

## 2016-02-16 DIAGNOSIS — Y998 Other external cause status: Secondary | ICD-10-CM | POA: Diagnosis not present

## 2016-02-16 DIAGNOSIS — M1990 Unspecified osteoarthritis, unspecified site: Secondary | ICD-10-CM | POA: Diagnosis not present

## 2016-02-16 DIAGNOSIS — I2510 Atherosclerotic heart disease of native coronary artery without angina pectoris: Secondary | ICD-10-CM | POA: Diagnosis not present

## 2016-02-16 DIAGNOSIS — Z8669 Personal history of other diseases of the nervous system and sense organs: Secondary | ICD-10-CM | POA: Diagnosis not present

## 2016-02-16 DIAGNOSIS — I509 Heart failure, unspecified: Secondary | ICD-10-CM | POA: Diagnosis not present

## 2016-02-16 DIAGNOSIS — S60511A Abrasion of right hand, initial encounter: Secondary | ICD-10-CM | POA: Diagnosis not present

## 2016-02-16 DIAGNOSIS — Z794 Long term (current) use of insulin: Secondary | ICD-10-CM | POA: Insufficient documentation

## 2016-02-16 DIAGNOSIS — Z7982 Long term (current) use of aspirin: Secondary | ICD-10-CM | POA: Insufficient documentation

## 2016-02-16 DIAGNOSIS — S199XXA Unspecified injury of neck, initial encounter: Secondary | ICD-10-CM | POA: Diagnosis not present

## 2016-02-16 DIAGNOSIS — S61210A Laceration without foreign body of right index finger without damage to nail, initial encounter: Secondary | ICD-10-CM | POA: Diagnosis not present

## 2016-02-16 DIAGNOSIS — Z79899 Other long term (current) drug therapy: Secondary | ICD-10-CM | POA: Diagnosis not present

## 2016-02-16 DIAGNOSIS — S6992XA Unspecified injury of left wrist, hand and finger(s), initial encounter: Secondary | ICD-10-CM | POA: Diagnosis not present

## 2016-02-16 DIAGNOSIS — S60512A Abrasion of left hand, initial encounter: Secondary | ICD-10-CM | POA: Diagnosis not present

## 2016-02-16 LAB — CUP PACEART REMOTE DEVICE CHECK
Battery Remaining Longevity: 122 mo
Battery Remaining Percentage: 95.5 %
Brady Statistic AP VS Percent: 27 %
Brady Statistic RA Percent Paced: 34 %
Brady Statistic RV Percent Paced: 10 %
Date Time Interrogation Session: 20170227101407
Implantable Lead Location: 753860
Lead Channel Impedance Value: 400 Ohm
Lead Channel Pacing Threshold Pulse Width: 0.4 ms
MDC IDC LEAD IMPLANT DT: 20151109
MDC IDC LEAD IMPLANT DT: 20151109
MDC IDC LEAD LOCATION: 753859
MDC IDC MSMT BATTERY VOLTAGE: 3.04 V
MDC IDC MSMT LEADCHNL RA PACING THRESHOLD AMPLITUDE: 0.75 V
MDC IDC MSMT LEADCHNL RA SENSING INTR AMPL: 5 mV
MDC IDC MSMT LEADCHNL RV IMPEDANCE VALUE: 440 Ohm
MDC IDC MSMT LEADCHNL RV PACING THRESHOLD AMPLITUDE: 1 V
MDC IDC MSMT LEADCHNL RV PACING THRESHOLD PULSEWIDTH: 0.4 ms
MDC IDC MSMT LEADCHNL RV SENSING INTR AMPL: 12 mV
MDC IDC PG SERIAL: 7666171
MDC IDC SET LEADCHNL RA PACING AMPLITUDE: 2 V
MDC IDC SET LEADCHNL RV PACING AMPLITUDE: 2.5 V
MDC IDC SET LEADCHNL RV PACING PULSEWIDTH: 0.4 ms
MDC IDC SET LEADCHNL RV SENSING SENSITIVITY: 2 mV
MDC IDC STAT BRADY AP VP PERCENT: 8.2 %
MDC IDC STAT BRADY AS VP PERCENT: 2.3 %
MDC IDC STAT BRADY AS VS PERCENT: 61 %

## 2016-02-16 MED ORDER — TETANUS-DIPHTH-ACELL PERTUSSIS 5-2.5-18.5 LF-MCG/0.5 IM SUSP
0.5000 mL | Freq: Once | INTRAMUSCULAR | Status: AC
  Administered 2016-02-16: 0.5 mL via INTRAMUSCULAR
  Filled 2016-02-16: qty 0.5

## 2016-02-16 MED ORDER — BACITRACIN ZINC 500 UNIT/GM EX OINT
1.0000 "application " | TOPICAL_OINTMENT | Freq: Two times a day (BID) | CUTANEOUS | Status: DC
  Administered 2016-02-16: 1 via TOPICAL

## 2016-02-16 NOTE — ED Notes (Signed)
Pt has a laceration to his left palm, under his right eye and on his right hand, bleeding is minimal and controlled

## 2016-02-16 NOTE — ED Notes (Signed)
Pt presents to er via ems for a fall, vs with ems were 160/90 pulse 90 respirations 20 spo2 94% on room air blood sugar 139, he was going to the doctor this morning when he fell from the second step off of his back porch for a check up on his diabetes, he has had another recent fall, patient is alert to place situation disoriented to the year, denies any pain, patient has obvious lacerations to his head and hands with minimal bleeding

## 2016-02-16 NOTE — ED Notes (Signed)
Pt unable to sign, understands discharge instructions and denies any concerns
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
## 2016-02-17 ENCOUNTER — Encounter: Payer: Self-pay | Admitting: Cardiology

## 2016-02-17 DIAGNOSIS — T148 Other injury of unspecified body region: Secondary | ICD-10-CM | POA: Diagnosis not present

## 2016-02-23 ENCOUNTER — Telehealth: Payer: Self-pay | Admitting: Family Medicine

## 2016-02-23 NOTE — Telephone Encounter (Signed)
LM for pt to sch AWV 4/17 at 8:15 before appt with Dr. Darnell Level at 9:15. mn

## 2016-02-27 ENCOUNTER — Other Ambulatory Visit: Payer: Self-pay | Admitting: Family Medicine

## 2016-02-29 NOTE — Telephone Encounter (Signed)
I called and lmom for pt to return call to office. We received refill request for Flomax, but per chart pt d/c med in 1/17 because it exacerbated symptoms.

## 2016-03-01 NOTE — Telephone Encounter (Signed)
Pt no longer takes med.

## 2016-03-02 NOTE — Telephone Encounter (Signed)
Pt spouse called back regarding AWV on 4/17 at 815. There is no availability on that time on [facility] schedule. Pt wife stated there would be no way she could get pt up and ready and be here by 815am.   Please call Pamala Hurry back at  [phone]

## 2016-03-03 NOTE — Telephone Encounter (Signed)
Patient's wife returned Maegen's call.

## 2016-03-04 ENCOUNTER — Encounter: Payer: Self-pay | Admitting: Family Medicine

## 2016-03-04 ENCOUNTER — Ambulatory Visit (INDEPENDENT_AMBULATORY_CARE_PROVIDER_SITE_OTHER): Payer: Medicare Other | Admitting: Family Medicine

## 2016-03-04 VITALS — BP 122/66 | HR 78 | Temp 97.5°F | Wt 218.8 lb

## 2016-03-04 DIAGNOSIS — R296 Repeated falls: Secondary | ICD-10-CM | POA: Diagnosis not present

## 2016-03-04 DIAGNOSIS — E1165 Type 2 diabetes mellitus with hyperglycemia: Secondary | ICD-10-CM

## 2016-03-04 DIAGNOSIS — E1140 Type 2 diabetes mellitus with diabetic neuropathy, unspecified: Secondary | ICD-10-CM

## 2016-03-04 DIAGNOSIS — N3000 Acute cystitis without hematuria: Secondary | ICD-10-CM | POA: Diagnosis not present

## 2016-03-04 DIAGNOSIS — IMO0002 Reserved for concepts with insufficient information to code with codable children: Secondary | ICD-10-CM

## 2016-03-04 LAB — POC URINALSYSI DIPSTICK (AUTOMATED)
GLUCOSE UA: NEGATIVE
Ketones, UA: NEGATIVE
Spec Grav, UA: >=1.03
UROBILINOGEN UA: 0.2
pH, UA: 6

## 2016-03-04 MED ORDER — CIPROFLOXACIN HCL 250 MG PO TABS: 250.0000 mg | ORAL_TABLET | Freq: Two times a day (BID) | ORAL | Status: DC

## 2016-03-04 MED ORDER — BASAGLAR KWIKPEN 100 UNIT/ML ~~LOC~~ SOPN: 46.0000 [IU] | PEN_INJECTOR | Freq: Every day | SUBCUTANEOUS | Status: DC

## 2016-03-04 NOTE — Progress Notes (Signed)
BP 122/66 mmHg  Pulse 78  Temp(Src) 97.5 F (36.4 C)  Wt 218 lb 12.8 oz (99.247 kg)   CC: f/u visit  Subjective:    Patient ID: Frank Simpson, male    DOB: 1931-09-17, 82 y.o.   MRN: [id number]  HPI: Frank Simpson is a 82 y.o. male presenting on 03/04/2016 for Nocturia and Dysuria   Recent ER visit after fall with multiple skin tears (02/11/2016). Suffered laceration to L palm, under R eye and R hand. CT head and C spine and plain films of extremities reassuringly negative for acute fracture. Tdap vaccine updated. Prior to this had fall at home with forehead laceration. Acute cystitis ER visit 12/2015. no further falls out of bed. HH stated patient did not qualify for hospital bed. They have now started using rail on bed. Last PT eval was 2016.   Has 3 steps to get into house, no ramp. Steps have rail.   DM - regularly does check sugars and brings log which was reviewed. Compliant with antihyperglycemic regimen which includes: basaglar 46u daily + humalog 25-30/daily. Denies low sugars or hypoglycemic symptoms. Denies paresthesias. Last diabetic eye exam 12/2015. Pneumovax: 2010. Prevnar: 2015. Lab Results  Component Value Date   HGBA1C 8.4* 12/15/2015    Fasting sugars 75-140. Pre lunch sugars 120-250 Pre dinner sugars 200-300 No low sugars.  basaglar insulin 46u daily in am Regular insulin averages 24-30 u  Main concern today is increased nocturia with accidents despite 2 pads and . Wife restarted flomax. Denies current abd pain, fever, nausea, flank pain.  Relevant past medical, surgical, family and social history reviewed and updated as indicated. Interim medical history since our last visit reviewed. Allergies and medications reviewed and updated. Current Outpatient Prescriptions on File Prior to Visit  Medication Sig  . acetaminophen (TYLENOL) 500 MG tablet Take 500 mg by mouth every 6 (six) hours as needed (pain).   [name] Kitchen amLODipine (NORVASC) 5 MG tablet Take 1 tablet (5  mg total) by mouth daily.  [name] Kitchen aspirin 81 MG chewable tablet Chew 1 tablet (81 mg total) by mouth daily.  . fexofenadine (ALLEGRA) 180 MG tablet Take 180 mg by mouth daily.  [name] Kitchen glucose blood (ONE TOUCH ULTRA TEST) test strip Use to check sugar 4 times daily. Dx: E11.40  . insulin lispro (HUMALOG) 100 UNIT/ML KiwkPen 4u with breakfast, 8u with lunch/dinner plus sliding scale, 42mosupply (Patient taking differently: Inject 4-8 Units into the skin 3 (three) times daily. 4u with breakfast, 8u with lunch/dinner plus sliding scale, 331moupply)  . Insulin Pen Needle 31G X 8 MM MISC Use as directed with Lantus solostar. Dx E11.40.  . [name] Kitchennsulin Syringe-Needle U-100 (BD INSULIN SYRINGE ULTRAFINE) 31G X 5/16" 0.5 ML MISC Inject 4x a day  . isosorbide mononitrate (IMDUR) 30 MG 24 hr tablet Take 1 tablet (30 mg total) by mouth daily.  . meclizine (ANTIVERT) 25 MG tablet Take 25 mg by mouth every 6 (six) hours as needed for dizziness.  . nitroGLYCERIN (NITROSTAT) 0.4 MG SL tablet Place 1 tablet (0.4 mg total) under the tongue every 5 (five) minutes as needed for chest pain. Up to three doses,if pain continues call 911  . ONE TOUCH LANCETS MISC Use to check sugar four times daily and as needed. Dx E11.40  . rosuvastatin (CRESTOR) 40 MG tablet Take 1 tablet (40 mg total) by mouth daily.  . sertraline (ZOLOFT) 50 MG tablet Take 1 tablet (50 mg total) by mouth daily.  .[name] Kitchen  cyanocobalamin 500 MCG tablet Take 1 tablet (500 mcg total) by mouth daily. (Patient not taking: Reported on 02/11/2016)   No current facility-administered medications on file prior to visit.    Review of Systems Per HPI unless specifically indicated in ROS section     Objective:    BP 122/66 mmHg  Pulse 78  Temp(Src) 97.5 F (36.4 C)  Wt 218 lb 12.8 oz (99.247 kg)  Wt Readings from Last 3 Encounters:  03/04/16 218 lb 12.8 oz (99.247 kg)  02/16/16 180 lb (81.647 kg)  02/11/16 218 lb 8 oz (99.111 kg)    Physical Exam  Constitutional: He  appears well-developed and well-nourished. No distress.  HENT:  Mouth/Throat: Oropharynx is clear and moist. No oropharyngeal exudate.  Eyes: Conjunctivae and EOM are normal. Pupils are equal, round, and reactive to light.  Neck: Normal range of motion. Neck supple.  Cardiovascular: Normal rate, regular rhythm, normal heart sounds and intact distal pulses.   No murmur heard. Pulmonary/Chest: Effort normal and breath sounds normal. No respiratory distress. He has no wheezes. He has no rales.  Musculoskeletal: He exhibits no edema.  Lymphadenopathy:    He has no cervical adenopathy.  Neurological:  Some masked fascies. No cogwheel rigidity.  Skin: Skin is warm and dry. No rash noted.  Psychiatric: He has a normal mood and affect.  Nursing note and vitals reviewed.  Results for orders placed or performed in visit on 03/04/16  POCT Urinalysis Dipstick (Automated)  Result Value Ref Range   Color, UA yellow    Clarity, UA cloudy    Glucose, UA neg    Bilirubin, UA 1+    Ketones, UA neg    Spec Grav, UA >=1.030    Blood, UA 3+    pH, UA 6.0    Protein, UA 2+    Urobilinogen, UA 0.2    Nitrite, UA +    Leukocytes, UA moderate (2+) (A) Negative      Assessment & Plan:   Problem List Items Addressed This Visit    Type 2 diabetes, uncontrolled, with neuropathy (HCC)    PM sugars remain elevated. Too early for A1c, check at next visit in 2 wks. No changes to meds.      Relevant Medications   Insulin Glargine (BASAGLAR KWIKPEN) 100 UNIT/ML SOPN   Frequent falls    Frequent falls in h/o dementia. HHPT has not helped per pt/wife. Regular walker use. Has bed rails. No parkinson signs of tremor or increased rigidity on exam today. Mild masked fascies. Consider parkinsonism.       Acute cystitis - Primary    Increased nocturia, frequency, some urinary retention.  UA suspicious for infection. Sent UCx today.  Treat with cipro 260m BID x 7 days (renally dose).  Update if not improved  with treatment.      Relevant Orders   POCT Urinalysis Dipstick (Automated) (Completed)   Urine culture       Follow up plan: Return if symptoms worsen or fail to improve.  JRia Bush MD

## 2016-03-04 NOTE — Assessment & Plan Note (Signed)
Increased nocturia, frequency, some urinary retention.  UA suspicious for infection. Sent UCx today.  Treat with cipro 262m BID x 7 days (renally dose).  Update if not improved with treatment.

## 2016-03-04 NOTE — Assessment & Plan Note (Signed)
Frequent falls in h/o dementia. HHPT has not helped per pt/wife. Regular walker use. Has bed rails. No parkinson signs of tremor or increased rigidity on exam today. Mild masked fascies. Consider parkinsonism.

## 2016-03-04 NOTE — Assessment & Plan Note (Signed)
PM sugars remain elevated. Too early for A1c, check at next visit in 2 wks. No changes to meds.

## 2016-03-04 NOTE — Progress Notes (Signed)
Pre visit review using our clinic review tool, if applicable. No additional management support is needed unless otherwise documented below in the visit note.

## 2016-03-04 NOTE — Patient Instructions (Addendum)
Urine looks concentrated and you have bladder infection. Treat with cipro 276m twice daily for 1 week.  I have sent urine culture. No changes to insulin. Return at next scheduled appointment on Easter Monday.

## 2016-03-07 LAB — URINE CULTURE: Colony Count: >=100000

## 2016-03-09 DIAGNOSIS — E1142 Type 2 diabetes mellitus with diabetic polyneuropathy: Secondary | ICD-10-CM | POA: Diagnosis not present

## 2016-03-09 DIAGNOSIS — I110 Hypertensive heart disease with heart failure: Secondary | ICD-10-CM

## 2016-03-09 DIAGNOSIS — E1165 Type 2 diabetes mellitus with hyperglycemia: Secondary | ICD-10-CM | POA: Diagnosis not present

## 2016-03-09 DIAGNOSIS — B9620 Unspecified Escherichia coli [E. coli] as the cause of diseases classified elsewhere: Secondary | ICD-10-CM | POA: Diagnosis not present

## 2016-03-09 DIAGNOSIS — I25119 Atherosclerotic heart disease of native coronary artery with unspecified angina pectoris: Secondary | ICD-10-CM

## 2016-03-09 DIAGNOSIS — N3091 Cystitis, unspecified with hematuria: Secondary | ICD-10-CM | POA: Diagnosis not present

## 2016-03-10 DIAGNOSIS — I739 Peripheral vascular disease, unspecified: Secondary | ICD-10-CM | POA: Diagnosis not present

## 2016-03-10 DIAGNOSIS — L603 Nail dystrophy: Secondary | ICD-10-CM | POA: Diagnosis not present

## 2016-03-10 DIAGNOSIS — E1151 Type 2 diabetes mellitus with diabetic peripheral angiopathy without gangrene: Secondary | ICD-10-CM | POA: Diagnosis not present

## 2016-03-10 LAB — HM DIABETES FOOT EXAM

## 2016-03-14 ENCOUNTER — Ambulatory Visit (INDEPENDENT_AMBULATORY_CARE_PROVIDER_SITE_OTHER): Payer: Medicare Other | Admitting: Family Medicine

## 2016-03-14 ENCOUNTER — Encounter: Payer: Self-pay | Admitting: Family Medicine

## 2016-03-14 VITALS — BP 122/80 | HR 73 | Temp 97.7°F | Wt 221.0 lb

## 2016-03-14 DIAGNOSIS — R413 Other amnesia: Secondary | ICD-10-CM | POA: Diagnosis not present

## 2016-03-14 DIAGNOSIS — N183 Chronic kidney disease, stage 3 (moderate): Secondary | ICD-10-CM

## 2016-03-14 DIAGNOSIS — E1165 Type 2 diabetes mellitus with hyperglycemia: Secondary | ICD-10-CM

## 2016-03-14 DIAGNOSIS — E1122 Type 2 diabetes mellitus with diabetic chronic kidney disease: Secondary | ICD-10-CM | POA: Diagnosis not present

## 2016-03-14 DIAGNOSIS — IMO0002 Reserved for concepts with insufficient information to code with codable children: Secondary | ICD-10-CM

## 2016-03-14 DIAGNOSIS — F323 Major depressive disorder, single episode, severe with psychotic features: Secondary | ICD-10-CM

## 2016-03-14 DIAGNOSIS — N3941 Urge incontinence: Secondary | ICD-10-CM

## 2016-03-14 DIAGNOSIS — E113293 Type 2 diabetes mellitus with mild nonproliferative diabetic retinopathy without macular edema, bilateral: Secondary | ICD-10-CM

## 2016-03-14 DIAGNOSIS — E1140 Type 2 diabetes mellitus with diabetic neuropathy, unspecified: Secondary | ICD-10-CM | POA: Diagnosis not present

## 2016-03-14 DIAGNOSIS — N3000 Acute cystitis without hematuria: Secondary | ICD-10-CM

## 2016-03-14 DIAGNOSIS — Z794 Long term (current) use of insulin: Secondary | ICD-10-CM

## 2016-03-14 LAB — RENAL FUNCTION PANEL
ALBUMIN: 3.7 g/dL (ref 3.5–5.2)
BUN: 27 mg/dL — AB (ref 6–23)
CALCIUM: 9.3 mg/dL (ref 8.4–10.5)
CO2: 26 mEq/L (ref 19–32)
CREATININE: 1.96 mg/dL — AB (ref 0.40–1.50)
Chloride: 103 mEq/L (ref 96–112)
GFR: 34.87 mL/min — AB (ref >60.00)
GLUCOSE: 208 mg/dL — AB (ref 70–99)
Phosphorus: 2.8 mg/dL (ref 2.3–4.6)
Potassium: 4.2 mEq/L (ref 3.5–5.1)
Sodium: 138 mEq/L (ref 135–145)

## 2016-03-14 LAB — HEMOGLOBIN A1C: HEMOGLOBIN A1C: 8 % — AB (ref 4.6–6.5)

## 2016-03-14 MED ORDER — DONEPEZIL HCL 5 MG PO TABS: 5.0000 mg | ORAL_TABLET | Freq: Every day | ORAL | Status: DC

## 2016-03-14 MED ORDER — TAMSULOSIN HCL 0.4 MG PO CAPS: 0.4000 mg | ORAL_CAPSULE | Freq: Every day | ORAL | Status: DC

## 2016-03-14 NOTE — Assessment & Plan Note (Signed)
Check renal panel today. Baseline Cr ~1.5

## 2016-03-14 NOTE — Assessment & Plan Note (Signed)
PM sugars remain elevated even after treating recent infection. Check A1c today, increase basaglar to 48 then 50u daily. Monitor for hypoglycemia. Goal A1c <8%.

## 2016-03-14 NOTE — Progress Notes (Signed)
BP 122/80 mmHg  Pulse 73  Temp(Src) 97.7 F (36.5 C) (Oral)  Wt 221 lb (100.245 kg)  SpO2 97%   CC: 3 mo DM f/u visit Subjective:    Patient ID: Frank Simpson, male    DOB: 1931/11/12, 82 y.o.   MRN: [id number]  HPI: Frank Simpson is a 82 y.o. male presenting on 03/14/2016 for Diabetes   Seen here 03/04/2016 after fall with ER evaluation and with worsening UTI sxs of nocturia and frequency, UCx grew >100k pansensitive Ecoli and treated with cipro 221m BID x 7 days (renally dosed). sxs have improved. Also restarted on flomax.   DM - regularly does check sugars and brings log which was reviewed. Compliant with antihyperglycemic regimen which includes: basaglar 46u daily + humalog 25-30/daily. Denies low sugars or hypoglycemic symptoms.Denies paresthesias. Last diabetic eye exam 12/2015.Pneumovax: 2010.Prevnar: 2015. Decreased activity, weight gain noted. Mainly sleeps and eats throughout the day. Saw podiatrist last week [facility]. Lab Results  Component Value Date   HGBA1C 8.4* 12/15/2015    Diabetic Foot Exam - Simple   No data filed     podiatrist 03/10/2016.  Fasting 90-140 Pre lunch 190-280 Pre dinner 250-300s  Persistent urinary urge incontinence - saw urologist with PTNS which didn't help. Tried and failed oxybutynin and vesicare. Finasteride ineffective. Tried myrbetriq which didn't help either. He does have bathroom routine.   Relevant past medical, surgical, family and social history reviewed and updated as indicated. Interim medical history since our last visit reviewed. Allergies and medications reviewed and updated. Current Outpatient Prescriptions on File Prior to Visit  Medication Sig  . acetaminophen (TYLENOL) 500 MG tablet Take 500 mg by mouth every 6 (six) hours as needed (pain).   .[name] KitchenamLODipine (NORVASC) 5 MG tablet Take 1 tablet (5 mg total) by mouth daily.  .[name] Kitchenaspirin 81 MG chewable tablet Chew 1 tablet (81 mg total) by mouth daily.  .  cyanocobalamin 500 MCG tablet Take 1 tablet (500 mcg total) by mouth daily.  . fexofenadine (ALLEGRA) 180 MG tablet Take 180 mg by mouth daily.  .[name] Kitchenglucose blood (ONE TOUCH ULTRA TEST) test strip Use to check sugar 4 times daily. Dx: E11.40  . insulin lispro (HUMALOG) 100 UNIT/ML KiwkPen 4u with breakfast, 8u with lunch/dinner plus sliding scale, 336moupply (Patient taking differently: Inject 4-8 Units into the skin 3 (three) times daily. 4u with breakfast, 8u with lunch/dinner plus sliding scale, 17m58mopply)  . Insulin Pen Needle 31G X 8 MM MISC Use as directed with Lantus solostar. Dx E11.40.  . IMarland Kitchensulin Syringe-Needle U-100 (BD INSULIN SYRINGE ULTRAFINE) 31G X 5/16" 0.5 ML MISC Inject 4x a day  . isosorbide mononitrate (IMDUR) 30 MG 24 hr tablet Take 1 tablet (30 mg total) by mouth daily.  . meclizine (ANTIVERT) 25 MG tablet Take 25 mg by mouth every 6 (six) hours as needed for dizziness.  . nitroGLYCERIN (NITROSTAT) 0.4 MG SL tablet Place 1 tablet (0.4 mg total) under the tongue every 5 (five) minutes as needed for chest pain. Up to three doses,if pain continues call 911  . ONE TOUCH LANCETS MISC Use to check sugar four times daily and as needed. Dx E11.40  . rosuvastatin (CRESTOR) 40 MG tablet Take 1 tablet (40 mg total) by mouth daily.  . sertraline (ZOLOFT) 50 MG tablet Take 1 tablet (50 mg total) by mouth daily.   No current facility-administered medications on file prior to visit.    Review of Systems Per  HPI unless specifically indicated in ROS section     Objective:    BP 122/80 mmHg  Pulse 73  Temp(Src) 97.7 F (36.5 C) (Oral)  Wt 221 lb (100.245 kg)  SpO2 97%  Wt Readings from Last 3 Encounters:  03/14/16 221 lb (100.245 kg)  03/04/16 218 lb 12.8 oz (99.247 kg)  02/16/16 180 lb (81.647 kg)   Body mass index is 31.71 kg/(m^2).  Physical Exam  Constitutional: He appears well-developed and well-nourished. No distress.  HENT:  Head: Normocephalic and atraumatic.  Right  Ear: External ear normal.  Left Ear: External ear normal.  Nose: Nose normal.  Mouth/Throat: Oropharynx is clear and moist. No oropharyngeal exudate.  Eyes: Conjunctivae and EOM are normal. Pupils are equal, round, and reactive to light. No scleral icterus.  Neck: Normal range of motion. Neck supple.  Cardiovascular: Normal rate, regular rhythm, normal heart sounds and intact distal pulses.   No murmur heard. Pulmonary/Chest: Effort normal and breath sounds normal. No respiratory distress. He has no wheezes. He has no rales.  Musculoskeletal: He exhibits edema (1+ pitting bilateral lower extremities).  See HPI for foot exam if done  Lymphadenopathy:    He has no cervical adenopathy.  Skin: Skin is warm and dry. No rash noted.  Psychiatric: He has a normal mood and affect.  Nursing note and vitals reviewed.  Results for orders placed or performed in visit on 03/14/16  [facility] DIABETES FOOT EXAM  Result Value Ref Range   [facility] Diabetic Foot Exam podiatrist    Lab Results  Component Value Date   VITAMINB12 578 12/15/2015       Assessment & Plan:   Problem List Items Addressed This Visit    Type 2 diabetes, uncontrolled, with neuropathy ([place]) - Primary    PM sugars remain elevated even after treating recent infection. Check A1c today, increase basaglar to 48 then 50u daily. Monitor for hypoglycemia. Goal A1c <8%.      Relevant Medications   Insulin Glargine (BASAGLAR KWIKPEN) 100 UNIT/ML SOPN   Other Relevant Orders   Hemoglobin A1c   CKD stage 3 due to type 2 diabetes mellitus ([place])    Check renal panel today. Baseline Cr ~1.5      Relevant Medications   Insulin Glargine (BASAGLAR KWIKPEN) 100 UNIT/ML SOPN   Other Relevant Orders   Renal function panel   Urge urinary incontinence    Wife's predominant concern affecting ability to care for him at home. Presumed BPH + autonomic dysfunction from h/o CVA and DM with overactive bladder. Has seen urologist in the past, s/p unsuccessful  PTNS. Tried and failed several antimuscarinics (oxybutynin, vesicare) as well as myrbetriq, need to be careful with side effects given dementia and instability. Will refer back to urology for re eval. Continue flomax.       Relevant Medications   tamsulosin (FLOMAX) 0.4 MG CAPS capsule   Other Relevant Orders   Ambulatory referral to Urology   Memory loss    Anticipate mild dementia - trial aricept 68m daily. Discussed monitoring nausea.      Diabetes mellitus type 2 [place]   Relevant Medications   Insulin Glargine (BASAGLAR KWIKPEN) 100 UNIT/ML SOPN   Major psychotic depression, single episode ([facility])    Stable on zoloft - continue.       Acute cystitis    Symptoms resolving after treatment.           Follow up plan: Return in about 3 months (around 06/13/2016)  for follow up visit.  [name], MD

## 2016-03-14 NOTE — Assessment & Plan Note (Addendum)
Symptoms resolving after treatment.

## 2016-03-14 NOTE — Assessment & Plan Note (Addendum)
Anticipate mild dementia - trial aricept 81m daily. Discussed monitoring nausea.

## 2016-03-14 NOTE — Progress Notes (Signed)
Pre visit review using our clinic review tool, if applicable. No additional management support is needed unless otherwise documented below in the visit note.

## 2016-03-14 NOTE — Assessment & Plan Note (Addendum)
Wife's predominant concern affecting ability to care for him at home. Presumed BPH + autonomic dysfunction from h/o CVA and DM with overactive bladder. Has seen urologist in the past, s/p unsuccessful PTNS. Tried and failed several antimuscarinics (oxybutynin, vesicare) as well as myrbetriq, need to be careful with side effects given dementia and instability. Will refer back to urology for re eval. Continue flomax.

## 2016-03-14 NOTE — Patient Instructions (Addendum)
I've refilled flomax. Increase basaglar to 48 units daily for 1 week and if persistent highs at night, increase to 50 units daily. Don't keep increasing if any lows.  We will refer you back to urologist for further evaluation of urine incontinence Trial aricept 90m daily for memory.  Return in 3 months for diabetes follow up, sooner if needed.

## 2016-03-14 NOTE — Assessment & Plan Note (Signed)
Stable on zoloft - continue.

## 2016-03-18 ENCOUNTER — Encounter: Payer: Self-pay | Admitting: *Deleted

## 2016-04-07 DIAGNOSIS — N390 Urinary tract infection, site not specified: Secondary | ICD-10-CM | POA: Diagnosis not present

## 2016-04-07 DIAGNOSIS — B9620 Unspecified Escherichia coli [E. coli] as the cause of diseases classified elsewhere: Secondary | ICD-10-CM | POA: Diagnosis not present

## 2016-04-07 DIAGNOSIS — Z0000 Encounter for general adult medical examination without abnormal findings: Secondary | ICD-10-CM | POA: Diagnosis not present

## 2016-04-07 DIAGNOSIS — N3941 Urge incontinence: Secondary | ICD-10-CM | POA: Diagnosis not present

## 2016-04-08 ENCOUNTER — Other Ambulatory Visit: Payer: Self-pay | Admitting: Family Medicine

## 2016-04-17 IMAGING — CR DG PORTABLE PELVIS
1 series · 1 of 1 positions shown · non-contrast
Comparison: None.

CLINICAL DATA: Fall from tractor.  Trauma

EXAM:
PORTABLE PELVIS 1-2 VIEWS

[AP]
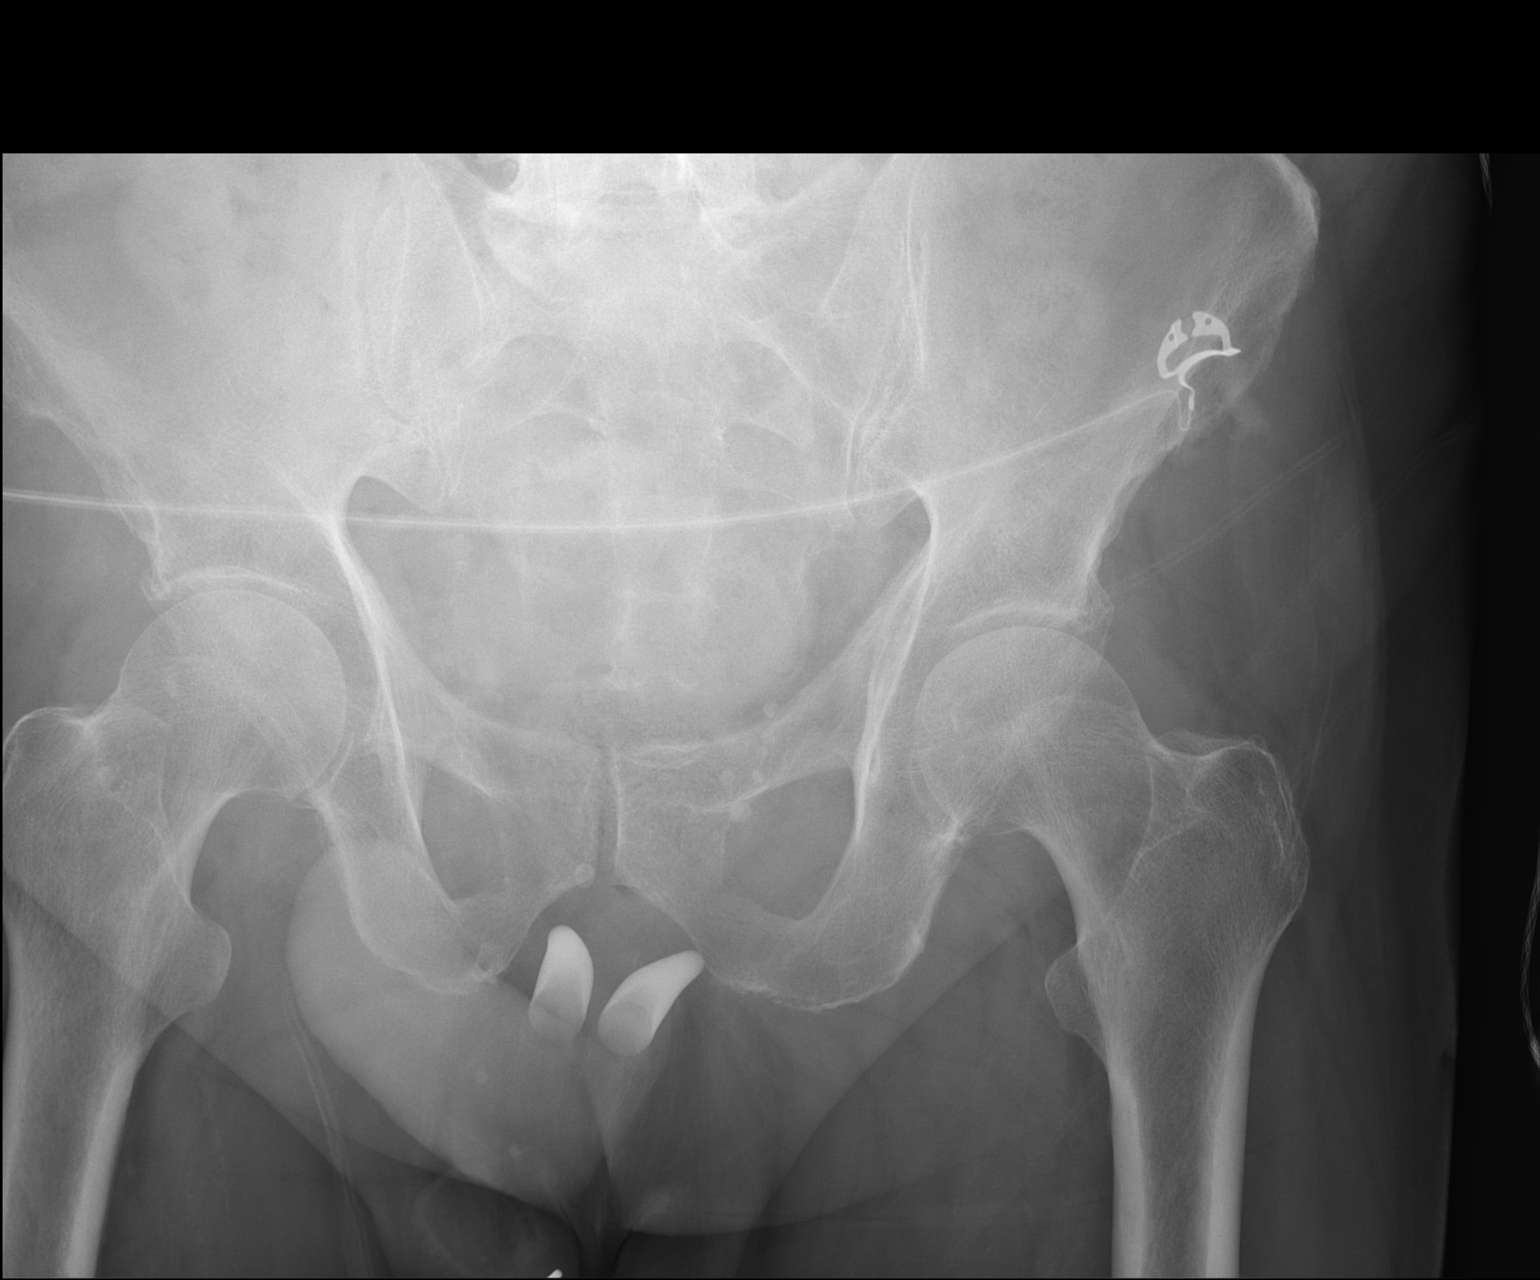

[1 of 1 positions shown; findings below may reference images not displayed]

FINDINGS: There is no evidence of pelvic fracture or diastasis. No pelvic bone
lesions are seen. Penile prosthesis
IMPRESSION: Negative.

## 2016-04-19 DIAGNOSIS — R8271 Bacteriuria: Secondary | ICD-10-CM | POA: Diagnosis not present

## 2016-04-19 DIAGNOSIS — N3941 Urge incontinence: Secondary | ICD-10-CM | POA: Diagnosis not present

## 2016-04-19 DIAGNOSIS — R350 Frequency of micturition: Secondary | ICD-10-CM | POA: Diagnosis not present

## 2016-04-19 DIAGNOSIS — Z0000 Encounter for general adult medical examination without abnormal findings: Secondary | ICD-10-CM | POA: Diagnosis not present

## 2016-04-19 DIAGNOSIS — N400 Enlarged prostate without lower urinary tract symptoms: Secondary | ICD-10-CM | POA: Diagnosis not present

## 2016-04-26 ENCOUNTER — Ambulatory Visit (INDEPENDENT_AMBULATORY_CARE_PROVIDER_SITE_OTHER): Payer: Medicare Other | Admitting: *Deleted

## 2016-04-26 DIAGNOSIS — I442 Atrioventricular block, complete: Secondary | ICD-10-CM | POA: Diagnosis not present

## 2016-04-27 NOTE — Progress Notes (Signed)
Remote pacemaker transmission.

## 2016-05-06 ENCOUNTER — Other Ambulatory Visit: Payer: Self-pay | Admitting: Family Medicine

## 2016-05-06 DIAGNOSIS — E7800 Pure hypercholesterolemia, unspecified: Secondary | ICD-10-CM

## 2016-05-06 DIAGNOSIS — N183 Chronic kidney disease, stage 3 unspecified: Secondary | ICD-10-CM

## 2016-05-06 DIAGNOSIS — IMO0002 Reserved for concepts with insufficient information to code with codable children: Secondary | ICD-10-CM

## 2016-05-06 DIAGNOSIS — E1140 Type 2 diabetes mellitus with diabetic neuropathy, unspecified: Secondary | ICD-10-CM

## 2016-05-06 DIAGNOSIS — E1165 Type 2 diabetes mellitus with hyperglycemia: Principal | ICD-10-CM

## 2016-05-06 DIAGNOSIS — E1122 Type 2 diabetes mellitus with diabetic chronic kidney disease: Secondary | ICD-10-CM

## 2016-05-09 ENCOUNTER — Ambulatory Visit (INDEPENDENT_AMBULATORY_CARE_PROVIDER_SITE_OTHER): Payer: Medicare Other

## 2016-05-09 ENCOUNTER — Ambulatory Visit: Payer: Self-pay

## 2016-05-09 ENCOUNTER — Encounter: Payer: Self-pay | Admitting: Family Medicine

## 2016-05-09 ENCOUNTER — Other Ambulatory Visit (INDEPENDENT_AMBULATORY_CARE_PROVIDER_SITE_OTHER): Payer: Medicare Other

## 2016-05-09 ENCOUNTER — Ambulatory Visit (INDEPENDENT_AMBULATORY_CARE_PROVIDER_SITE_OTHER): Payer: Medicare Other | Admitting: Family Medicine

## 2016-05-09 VITALS — BP 132/70 | HR 69 | Temp 97.9°F | Ht 71.5 in | Wt 224.2 lb

## 2016-05-09 DIAGNOSIS — E1122 Type 2 diabetes mellitus with diabetic chronic kidney disease: Secondary | ICD-10-CM

## 2016-05-09 DIAGNOSIS — E7800 Pure hypercholesterolemia, unspecified: Secondary | ICD-10-CM | POA: Diagnosis not present

## 2016-05-09 DIAGNOSIS — N3000 Acute cystitis without hematuria: Secondary | ICD-10-CM | POA: Diagnosis not present

## 2016-05-09 DIAGNOSIS — N3941 Urge incontinence: Secondary | ICD-10-CM | POA: Diagnosis not present

## 2016-05-09 DIAGNOSIS — IMO0002 Reserved for concepts with insufficient information to code with codable children: Secondary | ICD-10-CM

## 2016-05-09 DIAGNOSIS — E1165 Type 2 diabetes mellitus with hyperglycemia: Secondary | ICD-10-CM | POA: Diagnosis not present

## 2016-05-09 DIAGNOSIS — N183 Chronic kidney disease, stage 3 (moderate): Secondary | ICD-10-CM | POA: Diagnosis not present

## 2016-05-09 DIAGNOSIS — E1140 Type 2 diabetes mellitus with diabetic neuropathy, unspecified: Secondary | ICD-10-CM

## 2016-05-09 DIAGNOSIS — R8290 Unspecified abnormal findings in urine: Secondary | ICD-10-CM | POA: Diagnosis not present

## 2016-05-09 DIAGNOSIS — Z0000 Encounter for general adult medical examination without abnormal findings: Secondary | ICD-10-CM

## 2016-05-09 LAB — POC URINALSYSI DIPSTICK (AUTOMATED)
BILIRUBIN UA: NEGATIVE
GLUCOSE UA: NEGATIVE
Ketones, UA: NEGATIVE
NITRITE UA: POSITIVE
Spec Grav, UA: >=1.03
UROBILINOGEN UA: 0.2
pH, UA: 6

## 2016-05-09 LAB — LIPID PANEL
CHOL/HDL RATIO: 3
Cholesterol: 108 mg/dL (ref 0–200)
HDL: 38.4 mg/dL — AB (ref >39.00)
LDL Cholesterol: 47 mg/dL (ref 0–99)
NONHDL: 69.55
Triglycerides: 115 mg/dL (ref 0.0–149.0)
VLDL: 23 mg/dL (ref 0.0–40.0)

## 2016-05-09 LAB — COMPREHENSIVE METABOLIC PANEL
ALK PHOS: 33 U/L — AB (ref 39–117)
ALT: 12 U/L (ref 0–53)
AST: 16 U/L (ref 0–37)
Albumin: 3.8 g/dL (ref 3.5–5.2)
BILIRUBIN TOTAL: 0.6 mg/dL (ref 0.2–1.2)
BUN: 26 mg/dL — AB (ref 6–23)
CO2: 27 meq/L (ref 19–32)
CREATININE: 1.98 mg/dL — AB (ref 0.40–1.50)
Calcium: 9 mg/dL (ref 8.4–10.5)
Chloride: 108 mEq/L (ref 96–112)
GFR: 34.45 mL/min — AB (ref >60.00)
GLUCOSE: 99 mg/dL (ref 70–99)
Potassium: 3.9 mEq/L (ref 3.5–5.1)
Sodium: 143 mEq/L (ref 135–145)
TOTAL PROTEIN: 7.3 g/dL (ref 6.0–8.3)

## 2016-05-09 LAB — MICROALBUMIN / CREATININE URINE RATIO
Creatinine,U: 173.6 mg/dL
Microalb Creat Ratio: 52.8 mg/g — ABNORMAL HIGH (ref 0.0–30.0)
Microalb, Ur: 91.7 mg/dL — ABNORMAL HIGH (ref 0.0–1.9)

## 2016-05-09 LAB — HEMOGLOBIN A1C: Hgb A1c MFr Bld: 7.2 % — ABNORMAL HIGH (ref 4.6–6.5)

## 2016-05-09 MED ORDER — CEPHALEXIN 500 MG PO CAPS: 500.0000 mg | ORAL_CAPSULE | Freq: Two times a day (BID) | ORAL | Status: DC

## 2016-05-09 NOTE — Progress Notes (Signed)
PCP notes:  Health maintenance:  Shingles - pt reported taken approx. 2 yrs ago  Abnormal screenings:  Hearing - failed Fall risk: High due to hx of multiple falls  Patient concerns:   Pt's spouse feels pt has UTI due to "foggy" urine  Nurse concerns:  None  Next PCP appt: 05/09/2016 @ 1000 (acute visit)

## 2016-05-09 NOTE — Patient Instructions (Signed)
Urine culture today. Start keflex twice daily for urine infection. Keep next weeks' appointment.

## 2016-05-09 NOTE — Progress Notes (Signed)
Pre visit review using our clinic review tool, if applicable. No additional management support is needed unless otherwise documented below in the visit note.

## 2016-05-09 NOTE — Patient Instructions (Addendum)
Frank Simpson , Thank you for taking time to come for your Medicare Wellness Visit. I appreciate your ongoing commitment to your health goals. Please review the following plan we discussed and let me know if I can assist you in the future.   These are the goals we discussed: Goals    . incontinence management     Starting 05/09/2016, I will attempt to use bedside commode at least 2 hours at night to prevent accidents in bed.        This is a list of the screening recommended for you and due dates:  Health Maintenance  Topic Date Due  . Urine Protein Check  06/10/2016  . Flu Shot  06/28/2016  . Hemoglobin A1C  09/13/2016  . Eye exam for diabetics  01/11/2017  . Complete foot exam   03/10/2017  . Tetanus Vaccine  02/15/2026  . DTaP/Tdap/Td vaccine  Completed  . Shingles Vaccine  Addressed  . Pneumonia vaccines  Completed    Preventive Care for Adults  A healthy lifestyle and preventive care can promote health and wellness. Preventive health guidelines for adults include the following key practices.  . A routine yearly physical is a good way to check with your health care provider about your health and preventive screening. It is a chance to share any concerns and updates on your health and to receive a thorough exam.  . Visit your dentist for a routine exam and preventive care every 6 months. Brush your teeth twice a day and floss once a day. Good oral hygiene prevents tooth decay and gum disease.  . The frequency of eye exams is based on your age, health, family medical history, use  of contact lenses, and other factors. Follow your health care provider's ecommendations for frequency of eye exams.  . Eat a healthy diet. Foods like vegetables, fruits, whole grains, low-fat dairy products, and lean protein foods contain the nutrients you need without too many calories. Decrease your intake of foods high in solid fats, added sugars, and salt. Eat the right amount of calories for you. Get  information about a proper diet from your health care provider, if necessary.  . Regular physical exercise is one of the most important things you can do for your health. Most adults should get at least 150 minutes of moderate-intensity exercise (any activity that increases your heart rate and causes you to sweat) each week. In addition, most adults need muscle-strengthening exercises on 2 or more days a week.  Silver Sneakers may be a benefit available to you. To determine eligibility, you may visit the website: www.silversneakers.com or contact program at (304)584-4651 Mon-Fri between 8AM-8PM.   . Maintain a healthy weight. The body mass index (BMI) is a screening tool to identify possible weight problems. It provides an estimate of body fat based on height and weight. Your health care provider can find your BMI and can help you achieve or maintain a healthy weight.   For adults 20 years and older: ? A BMI below 18.5 is considered underweight. ? A BMI of 18.5 to 24.9 is normal. ? A BMI of 25 to 29.9 is considered overweight. ? A BMI of 30 and above is considered obese.   . Maintain normal blood lipids and cholesterol levels by exercising and minimizing your intake of saturated fat. Eat a balanced diet with plenty of fruit and vegetables. Blood tests for lipids and cholesterol should begin at age 39 and be repeated every 5 years. If your  lipid or cholesterol levels are high, you are over 50, or you are at high risk for heart disease, you may need your cholesterol levels checked more frequently. Ongoing high lipid and cholesterol levels should be treated with medicines if diet and exercise are not working.  . If you smoke, find out from your health care provider how to quit. If you do not use tobacco, please do not start.  . If you choose to drink alcohol, please do not consume more than 2 drinks per day. One drink is considered to be 12 ounces (355 mL) of beer, 5 ounces (148 mL) of wine, or 1.5  ounces (44 mL) of liquor.  . If you are 41-64 years old, ask your health care provider if you should take aspirin to prevent strokes.  . Use sunscreen. Apply sunscreen liberally and repeatedly throughout the day. You should seek shade when your shadow is shorter than you. Protect yourself by wearing long sleeves, pants, a wide-brimmed hat, and sunglasses year round, whenever you are outdoors.  . Once a month, do a whole body skin exam, using a mirror to look at the skin on your back. Tell your health care provider of new moles, moles that have irregular borders, moles that are larger than a pencil eraser, or moles that have changed in shape or color.     Fall Prevention in the Home  Falls can cause injuries. They can happen to people of all ages. There are many things you can do to make your home safe and to help prevent falls.  WHAT CAN I DO ON THE OUTSIDE OF MY HOME?  Regularly fix the edges of walkways and driveways and fix any cracks.  Remove anything that might make you trip as you walk through a door, such as a raised step or threshold.  Trim any bushes or trees on the path to your home.  Use bright outdoor lighting.  Clear any walking paths of anything that might make someone trip, such as rocks or tools.  Regularly check to see if handrails are loose or broken. Make sure that both sides of any steps have handrails.  Any raised decks and porches should have guardrails on the edges.  Have any leaves, snow, or ice cleared regularly.  Use sand or salt on walking paths during winter.  Clean up any spills in your garage right away. This includes oil or grease spills. WHAT CAN I DO IN THE BATHROOM?   Use night lights.  Install grab bars by the toilet and in the tub and shower. Do not use towel bars as grab bars.  Use non-skid mats or decals in the tub or shower.  If you need to sit down in the shower, use a plastic, non-slip stool.  Keep the floor dry. Clean up any water  that spills on the floor as soon as it happens.  Remove soap buildup in the tub or shower regularly.  Attach bath mats securely with double-sided non-slip rug tape.  Do not have throw rugs and other things on the floor that can make you trip. WHAT CAN I DO IN THE BEDROOM?  Use night lights.  Make sure that you have a light by your bed that is easy to reach.  Do not use any sheets or blankets that are too big for your bed. They should not hang down onto the floor.  Have a firm chair that has side arms. You can use this for support while you get dressed.  Do not have throw rugs and other things on the floor that can make you trip. WHAT CAN I DO IN THE KITCHEN?  Clean up any spills right away.  Avoid walking on wet floors.  Keep items that you use a lot in easy-to-reach places.  If you need to reach something above you, use a strong step stool that has a grab bar.  Keep electrical cords out of the way.  Do not use floor polish or wax that makes floors slippery. If you must use wax, use non-skid floor wax.  Do not have throw rugs and other things on the floor that can make you trip. WHAT CAN I DO WITH MY STAIRS?  Do not leave any items on the stairs.  Make sure that there are handrails on both sides of the stairs and use them. Fix handrails that are broken or loose. Make sure that handrails are as long as the stairways.  Check any carpeting to make sure that it is firmly attached to the stairs. Fix any carpet that is loose or worn.  Avoid having throw rugs at the top or bottom of the stairs. If you do have throw rugs, attach them to the floor with carpet tape.  Make sure that you have a light switch at the top of the stairs and the bottom of the stairs. If you do not have them, ask someone to add them for you. WHAT ELSE CAN I DO TO HELP PREVENT FALLS?  Wear shoes that:  Do not have high heels.  Have rubber bottoms.  Are comfortable and fit you well.  Are closed at  the toe. Do not wear sandals.  If you use a stepladder:  Make sure that it is fully opened. Do not climb a closed stepladder.  Make sure that both sides of the stepladder are locked into place.  Ask someone to hold it for you, if possible.  Clearly mark and make sure that you can see:  Any grab bars or handrails.  First and last steps.  Where the edge of each step is.  Use tools that help you move around (mobility aids) if they are needed. These include:  Canes.  Walkers.  Scooters.  Crutches.  Turn on the lights when you go into a dark area. Replace any light bulbs as soon as they burn out.  Set up your furniture so you have a clear path. Avoid moving your furniture around.  If any of your floors are uneven, fix them.  If there are any pets around you, be aware of where they are.  Review your medicines with your doctor. Some medicines can make you feel dizzy. This can increase your chance of falling. Ask your doctor what other things that you can do to help prevent falls.   This information is not intended to replace advice given to you by your health care provider. Make sure you discuss any questions you have with your health care provider.   Document Released: 09/10/2009 Document Revised: 03/31/2015 Document Reviewed: 12/19/2014 Elsevier Interactive Patient Education [employer].

## 2016-05-09 NOTE — Assessment & Plan Note (Addendum)
Discussed treatment - continue flomax and myrbetriq, consider re addition of vesicare. Has seen urology - no great response to treatment.  Recommended Q2 hour bathroom breaks while up during the day.

## 2016-05-09 NOTE — Progress Notes (Signed)
BP 132/70 mmHg  Pulse 69  Temp(Src) 97.9 F (36.6 C) (Oral)  Ht 5' 11.5" (1.816 m)  Wt 224 lb 4 oz (101.719 kg)  BMI 30.84 kg/m2  SpO2 93%   CC: UTI?  Subjective:    Patient ID: Frank Simpson, Frank Simpson    DOB: Apr 18, 1931, 82 y.o.   MRN: [id number]  HPI: Frank Simpson is a 82 y.o. Frank Simpson presenting on 05/09/2016 for Urinary Tract Infection   Seen by Katha Cabal this morning for medicare wellness visit, comes in for acute visit for foul odor to urine with cloudy color. 2-3 d h/o recurrent symptoms of cloudy urine and odor. Ongoing incontinence - goes through 4-5 depens diapers, more trouble at night time. No significant dysuria, hematuria.   Last UTI 02/2016 - treated with cipro 224m bid x7d Prior to this UTI 12/2015 at ER - treated with keflex 5057mtid x7d Has seen Dr OtKarsten Ro/2017 and sounds like treated for another UTI with another abx course. Also treated for urinary incontinence - with 2wk sample of myrbetriq + vesicare without significant improvement. Neuropathic bladder.   Persistent urinary urge incontinence - saw urologist with PTNS which didn't help. Tried and failed oxybutynin and vesicare. Finasteride ineffective. Tried myrbetriq which didn't help either. He does have bathroom routine. flomax restarted.   Relevant past medical, surgical, family and social history reviewed and updated as indicated. Interim medical history since our last visit reviewed. Allergies and medications reviewed and updated. Current Outpatient Prescriptions on File Prior to Visit  Medication Sig  . acetaminophen (TYLENOL) 500 MG tablet Take 500 mg by mouth every 6 (six) hours as needed (pain).   . [name] KitchenmLODipine (NORVASC) 5 MG tablet Take 1 tablet (5 mg total) by mouth daily.  . [name] Kitchenspirin 81 MG chewable tablet Chew 1 tablet (81 mg total) by mouth daily.  . [name] Kitchenonepezil (ARICEPT) 5 MG tablet Take 1 tablet (5 mg total) by mouth at bedtime.  . fexofenadine (ALLEGRA) 180 MG tablet Take 180 mg by mouth daily.  . [name] Kitchenglucose blood (ONE TOUCH ULTRA TEST) test strip Use to check sugar 4 times daily. Dx: E11.40  . Insulin Glargine (BASAGLAR KWIKPEN) 100 UNIT/ML SOPN Inject 0.5 mLs (50 Units total) into the skin daily.  . insulin lispro (HUMALOG) 100 UNIT/ML KiwkPen 4u with breakfast, 8u with lunch/dinner plus sliding scale, 3m65mopply (Patient taking differently: Inject 4-8 Units into the skin 3 (three) times daily. 4u with breakfast, 8u with lunch/dinner plus sliding scale, 34mo14moply)  . Insulin Pen Needle 31G X 8 MM MISC Use as directed with Lantus solostar. Dx E11.40.  . InMarland Kitchenulin Syringe-Needle U-100 (BD INSULIN SYRINGE ULTRAFINE) 31G X 5/16" 0.5 ML MISC Inject 4x a day  . isosorbide mononitrate (IMDUR) 30 MG 24 hr tablet Take 1 tablet (30 mg total) by mouth daily.  . meclizine (ANTIVERT) 25 MG tablet Take 25 mg by mouth every 6 (six) hours as needed for dizziness.  . nitroGLYCERIN (NITROSTAT) 0.4 MG SL tablet Place 1 tablet (0.4 mg total) under the tongue every 5 (five) minutes as needed for chest pain. Up to three doses,if pain continues call 911  . ONE TOUCH LANCETS MISC Use to check sugar four times daily and as needed. Dx E11.40  . rosuvastatin (CRESTOR) 40 MG tablet Take 1 tablet (40 mg total) by mouth daily.  . sertraline (ZOLOFT) 50 MG tablet TAKE 1 TABLET DAILY  . tamsulosin (FLOMAX) 0.4 MG CAPS capsule Take 1 capsule (0.4 mg total) by mouth daily.  No current facility-administered medications on file prior to visit.    Review of Systems Per HPI unless specifically indicated in ROS section     Objective:    BP 132/70 mmHg  Pulse 69  Temp(Src) 97.9 F (36.6 C) (Oral)  Ht 5' 11.5" (1.816 m)  Wt 224 lb 4 oz (101.719 kg)  BMI 30.84 kg/m2  SpO2 93%  Wt Readings from Last 3 Encounters:  05/09/16 224 lb 4 oz (101.719 kg)  05/09/16 224 lb 4 oz (101.719 kg)  03/14/16 221 lb (100.245 kg)    Physical Exam  Constitutional: He appears well-developed and well-nourished. No distress.  Abdominal:  Soft. Normal appearance and bowel sounds are normal. He exhibits no distension and no mass. There is no tenderness. There is no rigidity, no rebound, no guarding, no CVA tenderness and negative Murphy's sign.  Nursing note and vitals reviewed.  Results for orders placed or performed in visit on 05/09/16  POCT Urinalysis Dipstick (Automated)  Result Value Ref Range   Color, UA Yellow    Clarity, UA Cloudy    Glucose, UA Negative    Bilirubin, UA Negative    Ketones, UA Negative    Spec Grav, UA >=1.030    Blood, UA 2+    pH, UA 6.0    Protein, UA 2+    Urobilinogen, UA 0.2    Nitrite, UA Positive    Leukocytes, UA large (3+) (A) Negative   Lab Results  Component Value Date   CREATININE 1.96* 03/14/2016       Assessment & Plan:   Problem List Items Addressed This Visit    Urge urinary incontinence    Discussed treatment - continue flomax and myrbetriq, consider re addition of vesicare. Has seen urology - no great response to treatment.  Recommended Q2 hour bathroom breaks while up during the day.       Relevant Medications   mirabegron ER (MYRBETRIQ) 25 MG TB24 tablet   Acute cystitis - Primary    Anticipate recurrent cystitis given UA and symptoms. Treat with keflex 569m BID 7d course. Check UCx. Update with results.  Has f/u appt next week.       Relevant Orders   Urine culture    Other Visit Diagnoses    Abnormal urine odor        Relevant Orders    POCT Urinalysis Dipstick (Automated) (Completed)        Follow up plan: No Follow-up on file.  JRia Bush MD

## 2016-05-09 NOTE — Assessment & Plan Note (Signed)
Anticipate recurrent cystitis given UA and symptoms. Treat with keflex 512m BID 7d course. Check UCx. Update with results.  Has f/u appt next week.
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
## 2016-05-10 NOTE — Progress Notes (Signed)
I reviewed health advisor's note, was available for consultation, and agree with documentation and plan.   Seen for UTI.

## 2016-05-11 ENCOUNTER — Ambulatory Visit: Payer: Self-pay | Admitting: Family Medicine

## 2016-05-11 LAB — URINE CULTURE

## 2016-05-12 LAB — CUP PACEART REMOTE DEVICE CHECK
Battery Remaining Longevity: 124 mo
Battery Remaining Percentage: 95.5 %
Brady Statistic AP VS Percent: 25 %
Brady Statistic AS VP Percent: 3.7 %
Brady Statistic AS VS Percent: 64 %
Implantable Lead Implant Date: 20151109
Implantable Lead Location: 753860
Lead Channel Pacing Threshold Amplitude: 1 V
Lead Channel Pacing Threshold Pulse Width: 0.4 ms
Lead Channel Sensing Intrinsic Amplitude: 4.1 mV
Lead Channel Setting Pacing Amplitude: 2 V
Lead Channel Setting Sensing Sensitivity: 2 mV
MDC IDC LEAD IMPLANT DT: 20151109
MDC IDC LEAD LOCATION: 753859
MDC IDC MSMT BATTERY VOLTAGE: 3.02 V
MDC IDC MSMT LEADCHNL RA IMPEDANCE VALUE: 410 Ohm
MDC IDC MSMT LEADCHNL RA PACING THRESHOLD AMPLITUDE: 0.75 V
MDC IDC MSMT LEADCHNL RA PACING THRESHOLD PULSEWIDTH: 0.4 ms
MDC IDC MSMT LEADCHNL RV IMPEDANCE VALUE: 460 Ohm
MDC IDC MSMT LEADCHNL RV SENSING INTR AMPL: 10.6 mV
MDC IDC SESS DTM: 20170530060013
MDC IDC SET LEADCHNL RV PACING AMPLITUDE: 2.5 V
MDC IDC SET LEADCHNL RV PACING PULSEWIDTH: 0.4 ms
MDC IDC STAT BRADY AP VP PERCENT: 7.3 %
MDC IDC STAT BRADY RA PERCENT PACED: 30 %
MDC IDC STAT BRADY RV PERCENT PACED: 11 %
Pulse Gen Serial Number: 7666171

## 2016-05-16 ENCOUNTER — Ambulatory Visit (INDEPENDENT_AMBULATORY_CARE_PROVIDER_SITE_OTHER): Payer: Medicare Other | Admitting: Family Medicine

## 2016-05-16 ENCOUNTER — Encounter: Payer: Self-pay | Admitting: Family Medicine

## 2016-05-16 VITALS — BP 124/84 | HR 76 | Temp 98.4°F | Wt 226.8 lb

## 2016-05-16 DIAGNOSIS — E7800 Pure hypercholesterolemia, unspecified: Secondary | ICD-10-CM

## 2016-05-16 DIAGNOSIS — E1165 Type 2 diabetes mellitus with hyperglycemia: Secondary | ICD-10-CM | POA: Diagnosis not present

## 2016-05-16 DIAGNOSIS — N183 Chronic kidney disease, stage 3 (moderate): Secondary | ICD-10-CM

## 2016-05-16 DIAGNOSIS — E1122 Type 2 diabetes mellitus with diabetic chronic kidney disease: Secondary | ICD-10-CM | POA: Diagnosis not present

## 2016-05-16 DIAGNOSIS — E113293 Type 2 diabetes mellitus with mild nonproliferative diabetic retinopathy without macular edema, bilateral: Secondary | ICD-10-CM

## 2016-05-16 DIAGNOSIS — N3941 Urge incontinence: Secondary | ICD-10-CM

## 2016-05-16 DIAGNOSIS — F333 Major depressive disorder, recurrent, severe with psychotic symptoms: Secondary | ICD-10-CM

## 2016-05-16 DIAGNOSIS — E1140 Type 2 diabetes mellitus with diabetic neuropathy, unspecified: Secondary | ICD-10-CM

## 2016-05-16 DIAGNOSIS — N3000 Acute cystitis without hematuria: Secondary | ICD-10-CM

## 2016-05-16 DIAGNOSIS — Z794 Long term (current) use of insulin: Secondary | ICD-10-CM

## 2016-05-16 DIAGNOSIS — IMO0002 Reserved for concepts with insufficient information to code with codable children: Secondary | ICD-10-CM

## 2016-05-16 DIAGNOSIS — Z7189 Other specified counseling: Secondary | ICD-10-CM | POA: Insufficient documentation

## 2016-05-16 MED ORDER — SERTRALINE HCL 50 MG PO TABS: 75.0000 mg | ORAL_TABLET | Freq: Every day | ORAL | Status: DC

## 2016-05-16 NOTE — Assessment & Plan Note (Signed)
Significant improvement in sugar control despite persistently elevated sugars in PM. No changes to med regimen today.

## 2016-05-16 NOTE — Assessment & Plan Note (Signed)
Completed keflex course.

## 2016-05-16 NOTE — Assessment & Plan Note (Signed)
Advanced directives - discussed. Does not want prolonged life support. Unsure about CPR .Would want wife to be HCPOA. Packet provided by [place] last week.. Considering options.

## 2016-05-16 NOTE — Assessment & Plan Note (Addendum)
Chronic, stable. Continue to monitor. Cr has crept up to 1.9s, prior baseline 1.5 (2014).  Possible new baseline. H/o recurrent UTIs latest last week.

## 2016-05-16 NOTE — Assessment & Plan Note (Signed)
H/o major episode of psychotic depression 2015. Stabilized on zolft 71m daily, but persistent depression stemming from disability. Will increase to 718mdaily and re evaluate at f/u 2 mo.

## 2016-05-16 NOTE — Assessment & Plan Note (Signed)
Great lipid control on crestor 95m daily - will decrease to 29mdaily.

## 2016-05-16 NOTE — Patient Instructions (Addendum)
Increase zoloft to 16m daily Cholesterol is doing well - cut crestor in half (254mdaily) Return in 2 months for follow up visit Good to see you today, call [place] questions.

## 2016-05-16 NOTE — Progress Notes (Signed)
Pre visit review using our clinic review tool, if applicable. No additional management support is needed unless otherwise documented below in the visit note.

## 2016-05-16 NOTE — Assessment & Plan Note (Signed)
Continue flomax and myrbetriq. Consider restarting vesicare. No further UTI sxs.

## 2016-05-16 NOTE — Progress Notes (Signed)
BP 124/84 mmHg  Pulse 76  Temp(Src) 98.4 F (36.9 C) (Oral)  Wt 226 lb 12 oz (102.853 kg)   CC: f/u visit  Subjective:    Patient ID: Frank Simpson, male    DOB: 08/05/1931, 82 y.o.   MRN: [id number]  HPI: Frank Simpson is a 82 y.o. male presenting on 05/16/2016 for Annual Exam   Seen by Katha Cabal 05/09/2016 for medicare wellness visit. Note reviewed.  Also seen that day for UTI - treated with keflex 7d course. UCx grew >100k Ecoli. On flomax and myrbetriq for urge incontinence. Discussed considering adding vesicare. Presumed BPH + autonomic dysfunction from h/o CVA and DM with overactive bladder. S/p urology evaluation.   Brings sugar log - great readings AM 89-150, lunch 120-300, dinner 160-200s. Appetite down.   Lab Results  Component Value Date   HGB 15.3 01/29/2016   Lab Results  Component Value Date   HGBA1C 7.2* 05/09/2016     Remains depressed due to limitations/disabilities.   Preventative: Colon cancer screening - has aged out. No blood in stool Prostate cancer screening - has aged out. Flu shot yearly Pneumovax 2010. Prevnar 2015 Tdap 2017 Zostavax - done at [facility] ~2015 Advanced directives - discussed. Does not want prolonged life support. Unsure about CPR .Would want wife to be HCPOA. Packet provided by [place] last week.. Considering options.   Relevant past medical, surgical, family and social history reviewed and updated as indicated. Interim medical history since our last visit reviewed. Allergies and medications reviewed and updated. Current Outpatient Prescriptions on File Prior to Visit  Medication Sig  . acetaminophen (TYLENOL) 500 MG tablet Take 500 mg by mouth every 6 (six) hours as needed (pain).   [name] Kitchen amLODipine (NORVASC) 5 MG tablet Take 1 tablet (5 mg total) by mouth daily.  [name] Kitchen aspirin 81 MG chewable tablet Chew 1 tablet (81 mg total) by mouth daily.  [name] Kitchen donepezil (ARICEPT) 5 MG tablet Take 1 tablet (5 mg total) by mouth at bedtime.  . fexofenadine  (ALLEGRA) 180 MG tablet Take 180 mg by mouth daily as needed.   [name] Kitchen glucose blood (ONE TOUCH ULTRA TEST) test strip Use to check sugar 4 times daily. Dx: E11.40  . Insulin Glargine (BASAGLAR KWIKPEN) 100 UNIT/ML SOPN Inject 0.5 mLs (50 Units total) into the skin daily.  . insulin lispro (HUMALOG) 100 UNIT/ML KiwkPen 4u with breakfast, 8u with lunch/dinner plus sliding scale, 65mosupply (Patient taking differently: Inject 4-8 Units into the skin 3 (three) times daily. 4u with breakfast, 8u with lunch/dinner plus sliding scale, 350moupply)  . Insulin Pen Needle 31G X 8 MM MISC Use as directed with Lantus solostar. Dx E11.40.  . [name] Kitchennsulin Syringe-Needle U-100 (BD INSULIN SYRINGE ULTRAFINE) 31G X 5/16" 0.5 ML MISC Inject 4x a day  . isosorbide mononitrate (IMDUR) 30 MG 24 hr tablet Take 1 tablet (30 mg total) by mouth daily.  . meclizine (ANTIVERT) 25 MG tablet Take 25 mg by mouth every 6 (six) hours as needed for dizziness.  . mirabegron ER (MYRBETRIQ) 25 MG TB24 tablet Take 1 tablet (25 mg total) by mouth daily.  . nitroGLYCERIN (NITROSTAT) 0.4 MG SL tablet Place 1 tablet (0.4 mg total) under the tongue every 5 (five) minutes as needed for chest pain. Up to three doses,if pain continues call 911  . ONE TOUCH LANCETS MISC Use to check sugar four times daily and as needed. Dx E11.40  . tamsulosin (FLOMAX) 0.4 MG CAPS capsule Take 1 capsule (0.4  mg total) by mouth daily.   No current facility-administered medications on file prior to visit.    Review of Systems Per HPI unless specifically indicated in ROS section     Objective:    BP 124/84 mmHg  Pulse 76  Temp(Src) 98.4 F (36.9 C) (Oral)  Wt 226 lb 12 oz (102.853 kg)  Wt Readings from Last 3 Encounters:  05/16/16 226 lb 12 oz (102.853 kg)  05/09/16 224 lb 4 oz (101.719 kg)  05/09/16 224 lb 4 oz (101.719 kg)    Physical Exam  Constitutional: He appears well-developed and well-nourished. No distress.  Cardiovascular: Normal rate, regular  rhythm, normal heart sounds and intact distal pulses.   No murmur heard. Pulmonary/Chest: Effort normal and breath sounds normal. No respiratory distress. He has no wheezes. He has no rales.  Musculoskeletal: He exhibits edema (1+).  Psychiatric: He has a normal mood and affect.  Nursing note and vitals reviewed.  Results for orders placed or performed in visit on 05/09/16  Urine culture  Result Value Ref Range   Culture ESCHERICHIA COLI    Colony Count >=100,000 COLONIES/ML    Organism ID, Bacteria ESCHERICHIA COLI       Susceptibility   Escherichia coli -  (no method available)    AMPICILLIN <=2 Sensitive     AMOX/CLAVULANIC <=2 Sensitive     AMPICILLIN/SULBACTAM <=2 Sensitive     PIP/TAZO <=4 Sensitive     IMIPENEM <=0.25 Sensitive     CEFAZOLIN <=4 Not Reportable     CEFTRIAXONE <=1 Sensitive     CEFTAZIDIME <=1 Sensitive     CEFEPIME <=1 Sensitive     GENTAMICIN <=1 Sensitive     TOBRAMYCIN <=1 Sensitive     CIPROFLOXACIN <=0.25 Sensitive     LEVOFLOXACIN <=0.12 Sensitive     NITROFURANTOIN <=16 Sensitive     TRIMETH/SULFA* <=20 Sensitive      * NR=NOT REPORTABLE,SEE COMMENTORAL therapy:A cefazolin MIC of <32 predicts susceptibility to the oral agents cefaclor,cefdinir,cefpodoxime,cefprozil,cefuroxime,cephalexin,and loracarbef when used for therapy of uncomplicated UTIs due to E.coli,K.pneumomiae,and P.mirabilis. PARENTERAL therapy: A cefazolinMIC of >8 indicates resistance to parenteralcefazolin. An alternate test method must beperformed to confirm susceptibility to parenteralcefazolin.  POCT Urinalysis Dipstick (Automated)  Result Value Ref Range   Color, UA Yellow    Clarity, UA Cloudy    Glucose, UA Negative    Bilirubin, UA Negative    Ketones, UA Negative    Spec Grav, UA >=1.030    Blood, UA 2+    pH, UA 6.0    Protein, UA 2+    Urobilinogen, UA 0.2    Nitrite, UA Positive    Leukocytes, UA large (3+) (A) Negative      Assessment & Plan:   Problem List  Items Addressed This Visit    Type 2 diabetes, uncontrolled, with neuropathy ([place]) - Primary    Significant improvement in sugar control despite persistently elevated sugars in PM. No changes to med regimen today.       Relevant Medications   rosuvastatin (CRESTOR) 20 MG tablet   Hypercholesterolemia    Great lipid control on crestor 41m daily - will decrease to 267mdaily.       Relevant Medications   rosuvastatin (CRESTOR) 20 MG tablet   CKD stage 3 due to type 2 diabetes mellitus (H[facility])    Chronic, stable. Continue to monitor. Cr has crept up to 1.9s, prior baseline 1.5 (2014).  Possible new baseline. H/o recurrent UTIs latest last week.  Relevant Medications   rosuvastatin (CRESTOR) 20 MG tablet   Urge urinary incontinence    Continue flomax and myrbetriq. Consider restarting vesicare. No further UTI sxs.      Diabetes mellitus type 2 with retinopathy ([place])   Relevant Medications   rosuvastatin (CRESTOR) 20 MG tablet   MDD (major depressive disorder), recurrent episode, severe ([place])    H/o major episode of psychotic depression 2015. Stabilized on zolft 45m daily, but persistent depression stemming from disability. Will increase to 710mdaily and re evaluate at f/u 2 mo.      Relevant Medications   sertraline (ZOLOFT) 50 MG tablet   Acute cystitis    Completed keflex course.       Advanced care planning/counseling discussion    Advanced directives - discussed. Does not want prolonged life support. Unsure about CPR .Would want wife to be HCPOA. [place] Considering options.           Follow up plan: Return in about 2 months (around 07/16/2016), or if symptoms worsen or fail to improve, for follow up visit.  JaRia BushMD

## 2016-05-17 ENCOUNTER — Encounter: Payer: Self-pay | Admitting: Cardiology

## 2016-05-24 ENCOUNTER — Encounter: Payer: Self-pay | Admitting: Cardiovascular Disease

## 2016-05-24 ENCOUNTER — Ambulatory Visit (INDEPENDENT_AMBULATORY_CARE_PROVIDER_SITE_OTHER): Payer: Medicare Other | Admitting: Cardiovascular Disease

## 2016-05-24 VITALS — BP 150/70 | HR 72 | Ht 71.0 in | Wt 226.8 lb

## 2016-05-24 DIAGNOSIS — I442 Atrioventricular block, complete: Secondary | ICD-10-CM

## 2016-05-24 DIAGNOSIS — I10 Essential (primary) hypertension: Secondary | ICD-10-CM | POA: Diagnosis not present

## 2016-05-24 DIAGNOSIS — E7800 Pure hypercholesterolemia, unspecified: Secondary | ICD-10-CM

## 2016-05-24 DIAGNOSIS — I2510 Atherosclerotic heart disease of native coronary artery without angina pectoris: Secondary | ICD-10-CM | POA: Diagnosis not present
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
## 2016-05-24 NOTE — Patient Instructions (Signed)
Medication Instructions:  Your physician recommends that you continue on your current medications as directed. Please refer to the Current Medication list given to you today.   Labwork: none  Testing/Procedures: none  Follow-Up: Your physician wants you to follow-up in: 6 months.  You will receive a reminder letter in the mail two months in advance. If you don't receive a letter, please call our office to schedule the follow-up appointment.   Any Other Special Instructions Will Be Listed Below (If Applicable).     If you need a refill on your cardiac medications before your next appointment, please call your pharmacy.

## 2016-06-08 DIAGNOSIS — I739 Peripheral vascular disease, unspecified: Secondary | ICD-10-CM | POA: Diagnosis not present

## 2016-06-08 DIAGNOSIS — L603 Nail dystrophy: Secondary | ICD-10-CM | POA: Diagnosis not present

## 2016-06-08 DIAGNOSIS — E1151 Type 2 diabetes mellitus with diabetic peripheral angiopathy without gangrene: Secondary | ICD-10-CM | POA: Diagnosis not present

## 2016-06-09 LAB — HM DIABETES FOOT EXAM

## 2016-06-13 ENCOUNTER — Encounter: Payer: Self-pay | Admitting: Family Medicine

## 2016-06-13 ENCOUNTER — Ambulatory Visit (INDEPENDENT_AMBULATORY_CARE_PROVIDER_SITE_OTHER): Payer: Medicare Other | Admitting: Family Medicine

## 2016-06-13 ENCOUNTER — Other Ambulatory Visit: Payer: Self-pay | Admitting: Family Medicine

## 2016-06-13 VITALS — BP 136/78 | HR 66 | Temp 98.6°F | Resp 16 | Ht 71.0 in | Wt 222.0 lb

## 2016-06-13 DIAGNOSIS — E1140 Type 2 diabetes mellitus with diabetic neuropathy, unspecified: Secondary | ICD-10-CM | POA: Diagnosis not present

## 2016-06-13 DIAGNOSIS — N3941 Urge incontinence: Secondary | ICD-10-CM | POA: Diagnosis not present

## 2016-06-13 DIAGNOSIS — I2510 Atherosclerotic heart disease of native coronary artery without angina pectoris: Secondary | ICD-10-CM | POA: Diagnosis not present

## 2016-06-13 DIAGNOSIS — F333 Major depressive disorder, recurrent, severe with psychotic symptoms: Secondary | ICD-10-CM

## 2016-06-13 DIAGNOSIS — E1165 Type 2 diabetes mellitus with hyperglycemia: Secondary | ICD-10-CM | POA: Diagnosis not present

## 2016-06-13 DIAGNOSIS — E7800 Pure hypercholesterolemia, unspecified: Secondary | ICD-10-CM

## 2016-06-13 DIAGNOSIS — IMO0002 Reserved for concepts with insufficient information to code with codable children: Secondary | ICD-10-CM

## 2016-06-13 DIAGNOSIS — B351 Tinea unguium: Secondary | ICD-10-CM

## 2016-06-13 LAB — POCT URINALYSIS DIP (MANUAL ENTRY)
Glucose, UA: NEGATIVE
Ketones, POC UA: NEGATIVE
Leukocytes, UA: NEGATIVE
NITRITE UA: NEGATIVE
RBC UA: NEGATIVE
SPEC GRAV UA: 1.025
Urobilinogen, UA: NEGATIVE
pH, UA: 6

## 2016-06-13 MED ORDER — SOLIFENACIN SUCCINATE 5 MG PO TABS: 5.0000 mg | ORAL_TABLET | Freq: Every day | ORAL | Status: DC

## 2016-06-13 MED ORDER — SERTRALINE HCL 50 MG PO TABS: 50.0000 mg | ORAL_TABLET | Freq: Every day | ORAL | Status: DC

## 2016-06-13 NOTE — Assessment & Plan Note (Signed)
UA today - no infection. Trial vesicare + myrbetriq. Continue flomax.

## 2016-06-13 NOTE — Progress Notes (Signed)
BP 136/78 mmHg  Pulse 66  Temp(Src) 98.6 F (37 C) (Oral)  Resp 16  Ht 5' 11" (1.803 m)  Wt 222 lb (100.699 kg)  BMI 30.98 kg/m2  SpO2 98%   CC: DM f/u visit Subjective:    Patient ID: Frank Simpson, male    DOB: 04/08/1931, 82 y.o.   MRN: [id number]  HPI: Frank Simpson is a 82 y.o. male presenting on 06/13/2016 for Diabetes; Strange Dreams; and OTHER   DM - regularly does check sugars, brings log: low 100s fasting, 90-200 lunch, 140-200s dinner. Compliant with antihyperglycemic regimen which includes: basaglar 5 daily.  Denies low sugars or hypoglycemic symptoms.  Denies paresthesias. Last diabetic eye exam 12/2015 - retinopathy.  Pneumovax: done.  Prevnar: completed. Lab Results  Component Value Date   HGBA1C 7.2* 05/09/2016   Diabetic Foot Exam - Simple   No data filed    R big toenail - fungal infection. Has been recommended toenail removal by podiatrist, but they are hesitant to have this done.    Several week history of worse smelling urine. Pt denies dysuria, nocturia, frequency or new urgency.   Last visit we increased sertraline to 74m daily for ongoing depression and episode of major depression with psychosis 2015. Since increase, noticing more vivid dreams and non restful sleep. Endorses seeing pictures of wife with other men - some paranoid thoughts.   Urge incontinence - on flomax and myrbetriq. Last visit we discussed restarting vesicare. Persistent trouble with this. Recommended against foley catheter by urology, discussed continued depens.  HLD - last visit we decreased crestor to 276mdaily. Doing well on this dose.  Lab Results  Component Value Date   LDLCALC 47 05/09/2016    Relevant past medical, surgical, family and social history reviewed and updated as indicated. Interim medical history since our last visit reviewed. Allergies and medications reviewed and updated. Current Outpatient Prescriptions on File Prior to Visit  Medication Sig  .  acetaminophen (TYLENOL) 500 MG tablet Take 500 mg by mouth every 6 (six) hours as needed (pain).   . [name] KitchenmLODipine (NORVASC) 5 MG tablet Take 1 tablet (5 mg total) by mouth daily.  . [name] Kitchenspirin 81 MG chewable tablet Chew 1 tablet (81 mg total) by mouth daily.  . [name] Kitchenonepezil (ARICEPT) 5 MG tablet Take 1 tablet (5 mg total) by mouth at bedtime.  . fexofenadine (ALLEGRA) 180 MG tablet Take 180 mg by mouth daily as needed for allergies.   . [name] Kitchenlucose blood (ONE TOUCH ULTRA TEST) test strip Use to check sugar 4 times daily. Dx: E11.40  . Insulin Glargine (BASAGLAR KWIKPEN) 100 UNIT/ML SOPN Inject 0.5 mLs (50 Units total) into the skin daily.  . insulin lispro (HUMALOG) 100 UNIT/ML injection Inject 4-8 Units into the skin 3 (three) times daily before meals.  . Insulin Pen Needle 31G X 8 MM MISC Use as directed with Lantus solostar. Dx E11.40.  . [name] Kitchennsulin Syringe-Needle U-100 (BD INSULIN SYRINGE ULTRAFINE) 31G X 5/16" 0.5 ML MISC Inject 4x a day  . isosorbide mononitrate (IMDUR) 30 MG 24 hr tablet Take 1 tablet (30 mg total) by mouth daily.  . meclizine (ANTIVERT) 25 MG tablet Take 25 mg by mouth every 6 (six) hours as needed for dizziness.  . mirabegron ER (MYRBETRIQ) 25 MG TB24 tablet Take 1 tablet (25 mg total) by mouth daily.  . nitroGLYCERIN (NITROSTAT) 0.4 MG SL tablet Place 1 tablet (0.4 mg total) under the tongue every 5 (five) minutes as needed for  chest pain. Up to three doses,if pain continues call 911  . ONE TOUCH LANCETS MISC Use to check sugar four times daily and as needed. Dx E11.40  . rosuvastatin (CRESTOR) 20 MG tablet Take 1 tablet (20 mg total) by mouth daily.  . tamsulosin (FLOMAX) 0.4 MG CAPS capsule Take 1 capsule (0.4 mg total) by mouth daily.   No current facility-administered medications on file prior to visit.    Review of Systems Per HPI unless specifically indicated in ROS section     Objective:    BP 136/78 mmHg  Pulse 66  Temp(Src) 98.6 F (37 C) (Oral)  Resp 16  Ht 5'  11" (1.803 m)  Wt 222 lb (100.699 kg)  BMI 30.98 kg/m2  SpO2 98%  Wt Readings from Last 3 Encounters:  06/13/16 222 lb (100.699 kg)  05/24/16 226 lb 12.8 oz (102.876 kg)  05/16/16 226 lb 12 oz (102.853 kg)    Physical Exam  Constitutional: He appears well-developed and well-nourished. No distress.  HENT:  Head: Normocephalic and atraumatic.  Right Ear: External ear normal.  Left Ear: External ear normal.  Nose: Nose normal.  Mouth/Throat: Oropharynx is clear and moist. No oropharyngeal exudate.  Eyes: Conjunctivae and EOM are normal. Pupils are equal, round, and reactive to light. No scleral icterus.  Neck: Normal range of motion. Neck supple.  Cardiovascular: Normal rate, regular rhythm, normal heart sounds and intact distal pulses.   No murmur heard. Pulmonary/Chest: Effort normal and breath sounds normal. No respiratory distress. He has no wheezes. He has no rales.  Musculoskeletal: He exhibits no edema.  See HPI for foot exam if done R great toe onychomycotic, dark discoloration deep to nail  Lymphadenopathy:    He has no cervical adenopathy.  Skin: Skin is warm and dry. No rash noted.  Psychiatric: He has a normal mood and affect.  Nursing note and vitals reviewed.  Results for orders placed or performed in visit on 06/13/16  POCT urinalysis dipstick  Result Value Ref Range   Color, UA yellow yellow   Clarity, UA clear clear   Glucose, UA negative negative   Bilirubin, UA small (A) negative   Ketones, POC UA negative negative   Spec Grav, UA 1.025    Blood, UA negative negative   pH, UA 6.0    Protein Ur, POC trace (A) negative   Urobilinogen, UA negative    Nitrite, UA Negative Negative   Leukocytes, UA Negative Negative  HM DIABETES FOOT EXAM  Result Value Ref Range   HM Diabetic Foot Exam podiatrist       Assessment & Plan:   Problem List Items Addressed This Visit    Hypercholesterolemia    Chronic, continue lower crestor dose.       MDD (major  depressive disorder), recurrent episode, severe ([place])    May have deteriorated with increased sertraline dose - with new mild psychotic symptoms of paranoia. Will decrease sertraline back to 23m daily, advised pt/wife to notify me if worsening paranoia for addition of antipsychotic.      Relevant Medications   sertraline (ZOLOFT) 50 MG tablet   Onychomycosis of right great toe    Without pain. Discussed Suggested defer toenail removal unless podiatry worried about other pathology at that toenail.       Type 2 diabetes, uncontrolled, with neuropathy ([facility]) - Primary    Chronic, best control to date. Continue basaglar 50u daily in am. No hypoglycemia. PM sugars remain elevated. No changes at this  time.       Urge urinary incontinence    UA today - no infection. Trial vesicare + myrbetriq. Continue flomax.      Relevant Orders   POCT urinalysis dipstick (Completed)       Follow up plan: Return in about 2 months (around 08/14/2016) for follow up visit.  [name], MD

## 2016-06-13 NOTE — Assessment & Plan Note (Signed)
May have deteriorated with increased sertraline dose - with new mild psychotic symptoms of paranoia. Will decrease sertraline back to 53m daily, advised pt/wife to notify me if worsening paranoia for addition of antipsychotic.

## 2016-06-13 NOTE — Assessment & Plan Note (Signed)
Chronic, continue lower crestor dose.

## 2016-06-13 NOTE — Assessment & Plan Note (Signed)
Without pain. Discussed Suggested defer toenail removal unless podiatry worried about other pathology at that toenail.

## 2016-06-13 NOTE — Patient Instructions (Addendum)
Decrease sertraline back to 61m daily. If vivid dreams don't improve, may trial off donepezil memory medicine.  Urinalysis today.  Let me know if worsening or persistent paranoid thoughts.  Cancel next month's appointment, schedule f/u in 2 months.

## 2016-06-13 NOTE — Assessment & Plan Note (Signed)
Chronic, best control to date. Continue basaglar 50u daily in am. No hypoglycemia. PM sugars remain elevated. No changes at this time.

## 2016-06-15 ENCOUNTER — Telehealth: Payer: Self-pay

## 2016-06-15 MED ORDER — SOLIFENACIN SUCCINATE 5 MG PO TABS: 5.0000 mg | ORAL_TABLET | Freq: Every day | ORAL | Status: DC

## 2016-06-15 NOTE — Telephone Encounter (Signed)
Mrs Kuenzel called (DPR signed) requesting vesicare be cancelled at local [facility] (done) and sent to [facility] due to being less expensive at mail order. Advised Mrs Gruszka done.

## 2016-06-28 LAB — HM DIABETES EYE EXAM

## 2016-07-01 ENCOUNTER — Encounter: Payer: Self-pay | Admitting: Cardiovascular Disease

## 2016-07-01 DIAGNOSIS — T148 Other injury of unspecified body region: Secondary | ICD-10-CM | POA: Diagnosis not present

## 2016-07-06 DIAGNOSIS — D0439 Carcinoma in situ of skin of other parts of face: Secondary | ICD-10-CM | POA: Diagnosis not present

## 2016-07-06 DIAGNOSIS — Z85828 Personal history of other malignant neoplasm of skin: Secondary | ICD-10-CM | POA: Diagnosis not present

## 2016-07-06 DIAGNOSIS — L821 Other seborrheic keratosis: Secondary | ICD-10-CM | POA: Diagnosis not present

## 2016-07-06 DIAGNOSIS — L814 Other melanin hyperpigmentation: Secondary | ICD-10-CM | POA: Diagnosis not present

## 2016-07-06 DIAGNOSIS — D692 Other nonthrombocytopenic purpura: Secondary | ICD-10-CM | POA: Diagnosis not present

## 2016-07-06 DIAGNOSIS — D225 Melanocytic nevi of trunk: Secondary | ICD-10-CM | POA: Diagnosis not present

## 2016-07-06 DIAGNOSIS — D2272 Melanocytic nevi of left lower limb, including hip: Secondary | ICD-10-CM | POA: Diagnosis not present

## 2016-07-06 DIAGNOSIS — C44329 Squamous cell carcinoma of skin of other parts of face: Secondary | ICD-10-CM | POA: Diagnosis not present

## 2016-07-06 DIAGNOSIS — D2271 Melanocytic nevi of right lower limb, including hip: Secondary | ICD-10-CM | POA: Diagnosis not present

## 2016-07-06 DIAGNOSIS — L570 Actinic keratosis: Secondary | ICD-10-CM | POA: Diagnosis not present

## 2016-07-10 ENCOUNTER — Other Ambulatory Visit: Payer: Self-pay | Admitting: Family Medicine

## 2016-07-13 ENCOUNTER — Ambulatory Visit: Payer: Self-pay | Admitting: Family Medicine

## 2016-07-13 DIAGNOSIS — E113291 Type 2 diabetes mellitus with mild nonproliferative diabetic retinopathy without macular edema, right eye: Secondary | ICD-10-CM | POA: Diagnosis not present

## 2016-07-13 DIAGNOSIS — Z961 Presence of intraocular lens: Secondary | ICD-10-CM | POA: Diagnosis not present

## 2016-07-13 DIAGNOSIS — H3561 Retinal hemorrhage, right eye: Secondary | ICD-10-CM | POA: Diagnosis not present

## 2016-07-13 DIAGNOSIS — H04123 Dry eye syndrome of bilateral lacrimal glands: Secondary | ICD-10-CM | POA: Diagnosis not present

## 2016-07-14 ENCOUNTER — Other Ambulatory Visit: Payer: Self-pay

## 2016-07-14 NOTE — Telephone Encounter (Signed)
Frank Simpson left v/m requesting refill myrbetriq to [facility]. Frank Simpson said pt has not taken recently but would like refill. Last refill # 90 on 05/13/15. Pt last seen 06/13/16.

## 2016-07-18 MED ORDER — MIRABEGRON ER 25 MG PO TB24: 25.0000 mg | ORAL_TABLET | Freq: Every day | ORAL | 3 refills | Status: DC

## 2016-07-26 ENCOUNTER — Ambulatory Visit (INDEPENDENT_AMBULATORY_CARE_PROVIDER_SITE_OTHER): Payer: Medicare Other | Admitting: *Deleted

## 2016-07-26 DIAGNOSIS — Z950 Presence of cardiac pacemaker: Secondary | ICD-10-CM

## 2016-07-26 DIAGNOSIS — I442 Atrioventricular block, complete: Secondary | ICD-10-CM

## 2016-07-27 NOTE — Progress Notes (Signed)
Remote pacemaker transmission.

## 2016-07-28 ENCOUNTER — Encounter: Payer: Self-pay | Admitting: Cardiology

## 2016-08-15 ENCOUNTER — Encounter: Payer: Self-pay | Admitting: Family Medicine

## 2016-08-15 ENCOUNTER — Ambulatory Visit (INDEPENDENT_AMBULATORY_CARE_PROVIDER_SITE_OTHER): Payer: Medicare Other | Admitting: Family Medicine

## 2016-08-15 VITALS — BP 130/78 | HR 68 | Temp 98.1°F | Wt 225.5 lb

## 2016-08-15 DIAGNOSIS — Z794 Long term (current) use of insulin: Secondary | ICD-10-CM

## 2016-08-15 DIAGNOSIS — E1140 Type 2 diabetes mellitus with diabetic neuropathy, unspecified: Secondary | ICD-10-CM

## 2016-08-15 DIAGNOSIS — I2510 Atherosclerotic heart disease of native coronary artery without angina pectoris: Secondary | ICD-10-CM

## 2016-08-15 DIAGNOSIS — R413 Other amnesia: Secondary | ICD-10-CM | POA: Diagnosis not present

## 2016-08-15 DIAGNOSIS — E113293 Type 2 diabetes mellitus with mild nonproliferative diabetic retinopathy without macular edema, bilateral: Secondary | ICD-10-CM | POA: Diagnosis not present

## 2016-08-15 DIAGNOSIS — R05 Cough: Secondary | ICD-10-CM

## 2016-08-15 DIAGNOSIS — F333 Major depressive disorder, recurrent, severe with psychotic symptoms: Secondary | ICD-10-CM

## 2016-08-15 DIAGNOSIS — R55 Syncope and collapse: Secondary | ICD-10-CM

## 2016-08-15 DIAGNOSIS — R059 Cough, unspecified: Secondary | ICD-10-CM

## 2016-08-15 DIAGNOSIS — R296 Repeated falls: Secondary | ICD-10-CM

## 2016-08-15 MED ORDER — ROSUVASTATIN CALCIUM 20 MG PO TABS: 20.0000 mg | ORAL_TABLET | Freq: Every day | ORAL | 3 refills | Status: DC

## 2016-08-15 MED ORDER — SOLIFENACIN SUCCINATE 5 MG PO TABS: 5.0000 mg | ORAL_TABLET | Freq: Every day | ORAL | 3 refills | Status: DC

## 2016-08-15 MED ORDER — MIRABEGRON ER 25 MG PO TB24: 25.0000 mg | ORAL_TABLET | Freq: Every day | ORAL | 3 refills | Status: DC

## 2016-08-15 NOTE — Patient Instructions (Addendum)
You are doing well today. Stay off donepezil. Stay on lower sertraine (zoloft) dose of 46m daily.  Return in 3 months for medicare wellness visit.  Return in 2 weeks for nurse visit for flu shot

## 2016-08-15 NOTE — Progress Notes (Signed)
BP 130/78   Pulse 68   Temp 98.1 F (36.7 C) (Oral)   Wt 225 lb 8 oz (102.3 kg)   BMI 31.45 kg/m    CC: 3 mo f/u visit Subjective:    Patient ID: Frank Simpson, male    DOB: 01-04-1931, 82 y.o.   MRN: [id number]  HPI: Frank Simpson is a 82 y.o. male presenting on 08/15/2016 for Follow-up (on diabetes) and Cough (for 3 weeks)   DM - regularly does check sugars, brings log: 80s-170s fasting, 130-190s lunch, 140s-260s dinner. Compliant with antihyperglycemic regimen which includes: basaglar 50 daily and humalog SSI 4-14 units with meals. Denies low sugars or hypoglycemic symptoms.  Denies paresthesias. Last diabetic eye exam 12/2015 - retinopathy.  Pneumovax: done.  Prevnar: completed.  Lab Results  Component Value Date   HGBA1C 7.2 (H) 05/09/2016    Diabetic Foot Exam - Simple   No data filed    Not done today  Cough present for last 3 weeks. Some better over last few days. No trouble sleeping. No fevers/chills, ST, HA. Some chest congestion without wheezing or dyspnea.   Paranoid thoughts have improved after stopping aricept and decreased zoloft to 99m daily.   Pt had AMS spell during cold - may have passed out after taking mucinex. cbg 153 during spell. Also last week slipped off commode and bruised L forearm last week.   Relevant past medical, surgical, family and social history reviewed and updated as indicated. Interim medical history since our last visit reviewed. Allergies and medications reviewed and updated. Current Outpatient Prescriptions on File Prior to Visit  Medication Sig  . acetaminophen (TYLENOL) 500 MG tablet Take 500 mg by mouth every 6 (six) hours as needed (pain).   .[name] KitchenamLODipine (NORVASC) 5 MG tablet Take 1 tablet (5 mg total) by mouth daily.  .[name] Kitchenaspirin 81 MG chewable tablet Chew 1 tablet (81 mg total) by mouth daily.  . fexofenadine (ALLEGRA) 180 MG tablet Take 180 mg by mouth daily as needed for allergies.   .[name] Kitchenglucose blood (ONE TOUCH ULTRA TEST)  test strip Use to check sugar 4 times daily. Dx: E11.40  . Insulin Glargine (BASAGLAR KWIKPEN) 100 UNIT/ML SOPN Inject 0.5 mLs (50 Units total) into the skin daily.  . insulin lispro (HUMALOG) 100 UNIT/ML injection Inject 4-8 Units into the skin 3 (three) times daily before meals.  . Insulin Pen Needle 31G X 8 MM MISC Use as directed with Lantus solostar. Dx E11.40.  .[name] KitchenInsulin Syringe-Needle U-100 (BD INSULIN SYRINGE ULTRAFINE) 31G X 5/16" 0.5 ML MISC Inject 4x a day  . isosorbide mononitrate (IMDUR) 30 MG 24 hr tablet Take 1 tablet (30 mg total) by mouth daily.  . meclizine (ANTIVERT) 25 MG tablet Take 25 mg by mouth every 6 (six) hours as needed for dizziness.  . nitroGLYCERIN (NITROSTAT) 0.4 MG SL tablet Place 1 tablet (0.4 mg total) under the tongue every 5 (five) minutes as needed for chest pain. Up to three doses,if pain continues call 911  . ONE TOUCH LANCETS MISC Use to check sugar four times daily and as needed. Dx E11.40  . tamsulosin (FLOMAX) 0.4 MG CAPS capsule Take 1 capsule (0.4 mg total) by mouth daily.   No current facility-administered medications on file prior to visit.     Review of Systems Per HPI unless specifically indicated in ROS section     Objective:    BP 130/78   Pulse 68   Temp 98.1 F (36.7  C) (Oral)   Wt 225 lb 8 oz (102.3 kg)   BMI 31.45 kg/m   Wt Readings from Last 3 Encounters:  08/15/16 225 lb 8 oz (102.3 kg)  06/13/16 222 lb (100.7 kg)  05/24/16 226 lb 12.8 oz (102.9 kg)    Physical Exam  Constitutional: He appears well-developed and well-nourished. No distress.  HENT:  Mouth/Throat: Oropharynx is clear and moist. No oropharyngeal exudate.  Eyes: Conjunctivae are normal. Pupils are equal, round, and reactive to light.  Cardiovascular: Normal rate, regular rhythm, normal heart sounds and intact distal pulses.   No murmur heard. Pulmonary/Chest: Effort normal and breath sounds normal. No respiratory distress. He has no wheezes. He has no rales.   Musculoskeletal: He exhibits no edema.  Skin: Skin is warm and dry. No rash noted.  Psychiatric: He has a normal mood and affect.  Nursing note and vitals reviewed.  Results for orders placed or performed in visit on 08/15/16  HM DIABETES EYE EXAM  Result Value Ref Range   HM Diabetic Eye Exam Retinopathy (A) No Retinopathy      Assessment & Plan:   Problem List Items Addressed This Visit    Cough    Cough present for last 3 wks. However seems to be improving on its own. Lungs clear. No signs of bacterial infection. Supportive care discussed. Defer flu shot today.       Diabetes mellitus type 2 with retinopathy [facility])    Wife states recent vision exam 06/2016. Chart updated.      Relevant Medications   rosuvastatin (CRESTOR) 20 MG tablet   Frequent falls    Last completed HHPT, not very helpful per wife. Continues regular walker use, has bed rails.       MDD (major depressive disorder), recurrent episode, severe (HCC)    Stable on lower sertraline dose 13m daily. continue       Relevant Medications   sertraline (ZOLOFT) 50 MG tablet   Memory loss    Wife discontinued aricept as it may have been causing paranoid thoughts.       Syncope    Recurrent syncopal episode during recent cold not related to hypoglycemia.       Relevant Medications   rosuvastatin (CRESTOR) 20 MG tablet   Type 2 diabetes, controlled, with neuropathy ([facility]    Chronic, stable. Continue current regimen. Labs next visit.       Relevant Medications   rosuvastatin (CRESTOR) 20 MG tablet    Other Visit Diagnoses   None.      Follow up plan: Return in about 3 months (around 11/14/2016) for medicare wellness visit.  JRia Bush MD

## 2016-08-15 NOTE — Assessment & Plan Note (Signed)
Wife discontinued aricept as it may have been causing paranoid thoughts.

## 2016-08-15 NOTE — Assessment & Plan Note (Signed)
Chronic, stable. Continue current regimen. Labs next visit.

## 2016-08-15 NOTE — Assessment & Plan Note (Addendum)
Last completed HHPT, not very helpful per wife. Continues regular walker use, has bed rails.

## 2016-08-15 NOTE — Progress Notes (Signed)
Pre visit review using our clinic review tool, if applicable. No additional management support is needed unless otherwise documented below in the visit note.

## 2016-08-15 NOTE — Assessment & Plan Note (Signed)
Wife states recent vision exam 06/2016. Chart updated.

## 2016-08-15 NOTE — Assessment & Plan Note (Signed)
Recurrent syncopal episode during recent cold not related to hypoglycemia.

## 2016-08-15 NOTE — Assessment & Plan Note (Signed)
Stable on lower sertraline dose 37m daily. continue

## 2016-08-15 NOTE — Assessment & Plan Note (Signed)
Cough present for last 3 wks. However seems to be improving on its own. Lungs clear. No signs of bacterial infection. Supportive care discussed. Defer flu shot today.

## 2016-08-16 LAB — CUP PACEART REMOTE DEVICE CHECK
Brady Statistic AP VP Percent: 10 %
Brady Statistic AP VS Percent: 21 %
Brady Statistic AS VP Percent: 6.3 %
Brady Statistic AS VS Percent: 62 %
Implantable Lead Implant Date: 20151109
Implantable Lead Implant Date: 20151109
Implantable Lead Location: 753859
Lead Channel Impedance Value: 440 Ohm
Lead Channel Pacing Threshold Amplitude: 1 V
Lead Channel Pacing Threshold Pulse Width: 0.4 ms
Lead Channel Sensing Intrinsic Amplitude: 9.9 mV
Lead Channel Setting Pacing Amplitude: 2 V
Lead Channel Setting Pacing Amplitude: 2.5 V
Lead Channel Setting Pacing Pulse Width: 0.4 ms
MDC IDC LEAD LOCATION: 753860
MDC IDC MSMT BATTERY REMAINING LONGEVITY: 122 mo
MDC IDC MSMT BATTERY REMAINING PERCENTAGE: 95.5 %
MDC IDC MSMT BATTERY VOLTAGE: 3.02 V
MDC IDC MSMT LEADCHNL RA IMPEDANCE VALUE: 400 Ohm
MDC IDC MSMT LEADCHNL RA PACING THRESHOLD AMPLITUDE: 0.75 V
MDC IDC MSMT LEADCHNL RA PACING THRESHOLD PULSEWIDTH: 0.4 ms
MDC IDC MSMT LEADCHNL RA SENSING INTR AMPL: 5 mV
MDC IDC SESS DTM: 20170829154112
MDC IDC SET LEADCHNL RV SENSING SENSITIVITY: 2 mV
MDC IDC STAT BRADY RA PERCENT PACED: 30 %
MDC IDC STAT BRADY RV PERCENT PACED: 17 %
Pulse Gen Serial Number: 7666171

## 2016-08-23 DIAGNOSIS — S0083XA Contusion of other part of head, initial encounter: Secondary | ICD-10-CM | POA: Diagnosis not present

## 2016-08-26 ENCOUNTER — Ambulatory Visit (INDEPENDENT_AMBULATORY_CARE_PROVIDER_SITE_OTHER): Payer: Medicare Other

## 2016-08-26 DIAGNOSIS — Z23 Encounter for immunization: Secondary | ICD-10-CM

## 2016-10-03 ENCOUNTER — Other Ambulatory Visit: Payer: Self-pay | Admitting: Family Medicine

## 2016-10-05 ENCOUNTER — Other Ambulatory Visit: Payer: Self-pay | Admitting: Cardiovascular Disease

## 2016-10-05 MED ORDER — AMLODIPINE BESYLATE 5 MG PO TABS: 5.0000 mg | ORAL_TABLET | Freq: Every day | ORAL | 2 refills | Status: DC

## 2016-10-05 MED ORDER — ISOSORBIDE MONONITRATE ER 30 MG PO TB24: 30.0000 mg | ORAL_TABLET | Freq: Every day | ORAL | 2 refills | Status: DC

## 2016-10-18 ENCOUNTER — Ambulatory Visit (INDEPENDENT_AMBULATORY_CARE_PROVIDER_SITE_OTHER): Payer: Medicare Other | Admitting: Internal Medicine

## 2016-10-18 ENCOUNTER — Encounter: Payer: Self-pay | Admitting: Internal Medicine

## 2016-10-18 ENCOUNTER — Encounter (INDEPENDENT_AMBULATORY_CARE_PROVIDER_SITE_OTHER): Payer: Self-pay

## 2016-10-18 VITALS — BP 152/80 | HR 88 | Ht 72.0 in | Wt 229.0 lb

## 2016-10-18 DIAGNOSIS — I2510 Atherosclerotic heart disease of native coronary artery without angina pectoris: Secondary | ICD-10-CM

## 2016-10-18 DIAGNOSIS — I442 Atrioventricular block, complete: Secondary | ICD-10-CM

## 2016-10-18 DIAGNOSIS — Z950 Presence of cardiac pacemaker: Secondary | ICD-10-CM | POA: Diagnosis not present
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
## 2016-10-18 NOTE — Patient Instructions (Addendum)
Medication Instructions:  Your physician recommends that you continue on your current medications as directed. Please refer to the Current Medication list given to you today.   Labwork: None Ordered   Testing/Procedures: None Ordered    Follow-Up: Your physician wants you to follow-up in: 1 year with Dr. Lovena Le. You will receive a reminder letter in the mail two months in advance. If you don't receive a letter, please call our office to schedule the follow-up appointment.  Remote monitoring is used to monitor your Pacemaker from home. This monitoring reduces the number of office visits required to check your device to one time per year. It allows [place] to keep an eye on the functioning of your device to ensure it is working properly. You are scheduled for a device check from home on 01/17/17. You may send your transmission at any time that day. If you have a wireless device, the transmission will be sent automatically. After your physician reviews your transmission, you will receive a postcard with your next transmission date.     Any Other Special Instructions Will Be Listed Below (If Applicable).     If you need a refill on your cardiac medications before your next appointment, please call your pharmacy.

## 2016-10-26 LAB — CUP PACEART INCLINIC DEVICE CHECK
Brady Statistic RA Percent Paced: 30 %
Brady Statistic RV Percent Paced: 24 %
Date Time Interrogation Session: 20171121211543
Implantable Lead Implant Date: 20151109
Implantable Lead Implant Date: 20151109
Implantable Lead Location: 753860
Lead Channel Impedance Value: 450 Ohm
Lead Channel Impedance Value: 487.5 Ohm
Lead Channel Pacing Threshold Amplitude: 1 V
Lead Channel Pacing Threshold Pulse Width: 0.4 ms
Lead Channel Pacing Threshold Pulse Width: 0.4 ms
Lead Channel Sensing Intrinsic Amplitude: 12 mV
Lead Channel Setting Pacing Amplitude: 2 V
Lead Channel Setting Sensing Sensitivity: 2 mV
MDC IDC LEAD LOCATION: 753859
MDC IDC MSMT BATTERY VOLTAGE: 3.02 V
MDC IDC MSMT LEADCHNL RA PACING THRESHOLD AMPLITUDE: 0.75 V
MDC IDC MSMT LEADCHNL RA SENSING INTR AMPL: 5 mV
MDC IDC PG IMPLANT DT: 20151109
MDC IDC SET LEADCHNL RV PACING AMPLITUDE: 2.5 V
MDC IDC SET LEADCHNL RV PACING PULSEWIDTH: 0.4 ms
Pulse Gen Serial Number: 7666171

## 2016-11-02 DIAGNOSIS — L821 Other seborrheic keratosis: Secondary | ICD-10-CM | POA: Diagnosis not present

## 2016-11-02 DIAGNOSIS — D0462 Carcinoma in situ of skin of left upper limb, including shoulder: Secondary | ICD-10-CM | POA: Diagnosis not present

## 2016-11-02 DIAGNOSIS — Z85828 Personal history of other malignant neoplasm of skin: Secondary | ICD-10-CM | POA: Diagnosis not present

## 2016-11-02 DIAGNOSIS — D1801 Hemangioma of skin and subcutaneous tissue: Secondary | ICD-10-CM | POA: Diagnosis not present

## 2016-11-02 DIAGNOSIS — D0439 Carcinoma in situ of skin of other parts of face: Secondary | ICD-10-CM | POA: Diagnosis not present

## 2016-11-04 ENCOUNTER — Other Ambulatory Visit: Payer: Self-pay | Admitting: Family Medicine

## 2016-11-08 ENCOUNTER — Inpatient Hospital Stay (HOSPITAL_COMMUNITY)
Admission: EM | Admit: 2016-11-08 | Discharge: 2016-11-10 | DRG: 087 | Disposition: A | Discharge status: Home Health | Payer: Medicare Other | Attending: Internal Medicine | Admitting: Internal Medicine

## 2016-11-08 ENCOUNTER — Emergency Department (HOSPITAL_COMMUNITY): Payer: Medicare Other

## 2016-11-08 ENCOUNTER — Encounter (HOSPITAL_COMMUNITY): Payer: Self-pay | Admitting: Emergency Medicine

## 2016-11-08 DIAGNOSIS — E785 Hyperlipidemia, unspecified: Secondary | ICD-10-CM | POA: Diagnosis present

## 2016-11-08 DIAGNOSIS — Y9301 Activity, walking, marching and hiking: Secondary | ICD-10-CM | POA: Diagnosis present

## 2016-11-08 DIAGNOSIS — E1165 Type 2 diabetes mellitus with hyperglycemia: Secondary | ICD-10-CM | POA: Diagnosis present

## 2016-11-08 DIAGNOSIS — Z85828 Personal history of other malignant neoplasm of skin: Secondary | ICD-10-CM

## 2016-11-08 DIAGNOSIS — E11319 Type 2 diabetes mellitus with unspecified diabetic retinopathy without macular edema: Secondary | ICD-10-CM | POA: Diagnosis not present

## 2016-11-08 DIAGNOSIS — I6200 Nontraumatic subdural hemorrhage, unspecified: Secondary | ICD-10-CM | POA: Diagnosis not present

## 2016-11-08 DIAGNOSIS — N183 Chronic kidney disease, stage 3 (moderate): Secondary | ICD-10-CM | POA: Diagnosis present

## 2016-11-08 DIAGNOSIS — S065X0A Traumatic subdural hemorrhage without loss of consciousness, initial encounter: Principal | ICD-10-CM | POA: Diagnosis present

## 2016-11-08 DIAGNOSIS — I129 Hypertensive chronic kidney disease with stage 1 through stage 4 chronic kidney disease, or unspecified chronic kidney disease: Secondary | ICD-10-CM | POA: Diagnosis present

## 2016-11-08 DIAGNOSIS — E1140 Type 2 diabetes mellitus with diabetic neuropathy, unspecified: Secondary | ICD-10-CM | POA: Diagnosis present

## 2016-11-08 DIAGNOSIS — W19XXXA Unspecified fall, initial encounter: Secondary | ICD-10-CM

## 2016-11-08 DIAGNOSIS — Z96651 Presence of right artificial knee joint: Secondary | ICD-10-CM | POA: Diagnosis present

## 2016-11-08 DIAGNOSIS — Z8673 Personal history of transient ischemic attack (TIA), and cerebral infarction without residual deficits: Secondary | ICD-10-CM

## 2016-11-08 DIAGNOSIS — Z89029 Acquired absence of unspecified finger(s): Secondary | ICD-10-CM

## 2016-11-08 DIAGNOSIS — R296 Repeated falls: Secondary | ICD-10-CM | POA: Diagnosis present

## 2016-11-08 DIAGNOSIS — S51011A Laceration without foreign body of right elbow, initial encounter: Secondary | ICD-10-CM | POA: Diagnosis present

## 2016-11-08 DIAGNOSIS — R55 Syncope and collapse: Secondary | ICD-10-CM | POA: Diagnosis not present

## 2016-11-08 DIAGNOSIS — Z955 Presence of coronary angioplasty implant and graft: Secondary | ICD-10-CM

## 2016-11-08 DIAGNOSIS — E1122 Type 2 diabetes mellitus with diabetic chronic kidney disease: Secondary | ICD-10-CM | POA: Diagnosis present

## 2016-11-08 DIAGNOSIS — D696 Thrombocytopenia, unspecified: Secondary | ICD-10-CM | POA: Diagnosis not present

## 2016-11-08 DIAGNOSIS — W010XXA Fall on same level from slipping, tripping and stumbling without subsequent striking against object, initial encounter: Secondary | ICD-10-CM | POA: Diagnosis present

## 2016-11-08 DIAGNOSIS — Z7982 Long term (current) use of aspirin: Secondary | ICD-10-CM

## 2016-11-08 DIAGNOSIS — Z950 Presence of cardiac pacemaker: Secondary | ICD-10-CM

## 2016-11-08 DIAGNOSIS — I252 Old myocardial infarction: Secondary | ICD-10-CM

## 2016-11-08 DIAGNOSIS — I2510 Atherosclerotic heart disease of native coronary artery without angina pectoris: Secondary | ICD-10-CM | POA: Diagnosis present

## 2016-11-08 DIAGNOSIS — S0990XA Unspecified injury of head, initial encounter: Secondary | ICD-10-CM | POA: Diagnosis not present

## 2016-11-08 DIAGNOSIS — Z85820 Personal history of malignant melanoma of skin: Secondary | ICD-10-CM

## 2016-11-08 DIAGNOSIS — S065XAA Traumatic subdural hemorrhage with loss of consciousness status unknown, initial encounter: Secondary | ICD-10-CM

## 2016-11-08 DIAGNOSIS — M7989 Other specified soft tissue disorders: Secondary | ICD-10-CM | POA: Diagnosis not present

## 2016-11-08 DIAGNOSIS — S065X9A Traumatic subdural hemorrhage with loss of consciousness of unspecified duration, initial encounter: Secondary | ICD-10-CM | POA: Diagnosis present

## 2016-11-08 DIAGNOSIS — Z794 Long term (current) use of insulin: Secondary | ICD-10-CM

## 2016-11-08 DIAGNOSIS — M25521 Pain in right elbow: Secondary | ICD-10-CM | POA: Diagnosis not present

## 2016-11-08 LAB — CBC
HEMATOCRIT: 48.5 % (ref 39.0–52.0)
HEMOGLOBIN: 16.7 g/dL (ref 13.0–17.0)
MCH: 31 pg (ref 26.0–34.0)
MCHC: 34.4 g/dL (ref 30.0–36.0)
MCV: 90.1 fL (ref 78.0–100.0)
Platelets: 146 10*3/uL — ABNORMAL LOW (ref 150–400)
RBC: 5.38 MIL/uL (ref 4.22–5.81)
RDW: 13.8 % (ref 11.5–15.5)
WBC: 13 10*3/uL — ABNORMAL HIGH (ref 4.0–10.5)

## 2016-11-08 LAB — URINALYSIS, ROUTINE W REFLEX MICROSCOPIC
BILIRUBIN URINE: NEGATIVE
KETONES UR: NEGATIVE mg/dL
Leukocytes, UA: NEGATIVE
Nitrite: NEGATIVE
PH: 5 (ref 5.0–8.0)
Specific Gravity, Urine: 1.024 (ref 1.005–1.030)

## 2016-11-08 LAB — BASIC METABOLIC PANEL
ANION GAP: 10 (ref 5–15)
BUN: 32 mg/dL — ABNORMAL HIGH (ref 6–20)
CO2: 24 mmol/L (ref 22–32)
Calcium: 9.1 mg/dL (ref 8.9–10.3)
Chloride: 103 mmol/L (ref 101–111)
Creatinine, Ser: 2.02 mg/dL — ABNORMAL HIGH (ref 0.61–1.24)
GFR calc non Af Amer: 29 mL/min — ABNORMAL LOW (ref >60)
GFR, EST AFRICAN AMERICAN: 33 mL/min — AB (ref >60)
GLUCOSE: 369 mg/dL — AB (ref 65–99)
POTASSIUM: 4.3 mmol/L (ref 3.5–5.1)
Sodium: 137 mmol/L (ref 135–145)

## 2016-11-08 LAB — GLUCOSE, CAPILLARY: Glucose-Capillary: 284 mg/dL — ABNORMAL HIGH (ref 65–99)

## 2016-11-08 LAB — CBG MONITORING, ED: Glucose-Capillary: 378 mg/dL — ABNORMAL HIGH (ref 65–99)

## 2016-11-08 MED ORDER — SODIUM CHLORIDE 0.9 % IV SOLN: INTRAVENOUS | Status: DC

## 2016-11-08 MED ORDER — ONDANSETRON HCL 4 MG/2ML IJ SOLN: 4.0000 mg | Freq: Four times a day (QID) | INTRAMUSCULAR | Status: DC | PRN

## 2016-11-08 MED ORDER — SERTRALINE HCL 50 MG PO TABS
25.0000 mg | ORAL_TABLET | Freq: Every day | ORAL | Status: DC
  Administered 2016-11-09: 25 mg via ORAL
  Filled 2016-11-08 (×2): qty 1

## 2016-11-08 MED ORDER — INSULIN ASPART 100 UNIT/ML ~~LOC~~ SOLN
0.0000 [IU] | Freq: Three times a day (TID) | SUBCUTANEOUS | Status: DC
  Administered 2016-11-09: 5 [IU] via SUBCUTANEOUS
  Administered 2016-11-09: 3 [IU] via SUBCUTANEOUS
  Administered 2016-11-10: 2 [IU] via SUBCUTANEOUS

## 2016-11-08 MED ORDER — LIDOCAINE-EPINEPHRINE (PF) 2 %-1:200000 IJ SOLN
10.0000 mL | Freq: Once | INTRAMUSCULAR | Status: AC
  Administered 2016-11-08: 10 mL
  Filled 2016-11-08: qty 20

## 2016-11-08 MED ORDER — ONDANSETRON HCL 4 MG PO TABS: 4.0000 mg | ORAL_TABLET | Freq: Four times a day (QID) | ORAL | Status: DC | PRN

## 2016-11-08 MED ORDER — NITROGLYCERIN 0.4 MG SL SUBL: 0.4000 mg | SUBLINGUAL_TABLET | SUBLINGUAL | Status: DC | PRN

## 2016-11-08 MED ORDER — HYDRALAZINE HCL 20 MG/ML IJ SOLN
5.0000 mg | Freq: Once | INTRAMUSCULAR | Status: AC
  Administered 2016-11-08: 5 mg via INTRAVENOUS
  Filled 2016-11-08: qty 1

## 2016-11-08 MED ORDER — LORATADINE 10 MG PO TABS
10.0000 mg | ORAL_TABLET | Freq: Every day | ORAL | Status: DC
  Administered 2016-11-09 – 2016-11-10 (×2): 10 mg via ORAL
  Filled 2016-11-08 (×2): qty 1

## 2016-11-08 MED ORDER — MECLIZINE HCL 25 MG PO TABS
25.0000 mg | ORAL_TABLET | Freq: Four times a day (QID) | ORAL | Status: DC | PRN
  Administered 2016-11-10: 25 mg via ORAL
  Filled 2016-11-08 (×2): qty 1

## 2016-11-08 MED ORDER — ISOSORBIDE MONONITRATE ER 30 MG PO TB24
30.0000 mg | ORAL_TABLET | Freq: Every day | ORAL | Status: DC
  Administered 2016-11-09 – 2016-11-10 (×2): 30 mg via ORAL
  Filled 2016-11-08 (×2): qty 1

## 2016-11-08 MED ORDER — ROSUVASTATIN CALCIUM 10 MG PO TABS
20.0000 mg | ORAL_TABLET | Freq: Every day | ORAL | Status: DC
  Administered 2016-11-09 – 2016-11-10 (×2): 20 mg via ORAL
  Filled 2016-11-08: qty 2
  Filled 2016-11-08: qty 4
  Filled 2016-11-08: qty 2
  Filled 2016-11-08: qty 4

## 2016-11-08 MED ORDER — AMLODIPINE BESYLATE 5 MG PO TABS
5.0000 mg | ORAL_TABLET | Freq: Every day | ORAL | Status: DC
Start: 2016-11-09 — End: 2016-11-10
  Administered 2016-11-09 – 2016-11-10 (×2): 5 mg via ORAL
  Filled 2016-11-08 (×2): qty 1

## 2016-11-08 MED ORDER — NICARDIPINE HCL IN NACL 20-0.86 MG/200ML-% IV SOLN: 3.0000 mg/h | Freq: Once | INTRAVENOUS | Status: DC

## 2016-11-08 MED ORDER — INSULIN GLARGINE 100 UNIT/ML ~~LOC~~ SOLN
50.0000 [IU] | Freq: Every day | SUBCUTANEOUS | Status: DC
  Administered 2016-11-08 – 2016-11-09 (×2): 50 [IU] via SUBCUTANEOUS
  Filled 2016-11-08 (×3): qty 0.5

## 2016-11-08 MED ORDER — MIRABEGRON ER 25 MG PO TB24
25.0000 mg | ORAL_TABLET | Freq: Every day | ORAL | Status: DC
  Administered 2016-11-09 – 2016-11-10 (×2): 25 mg via ORAL
  Filled 2016-11-08 (×2): qty 1

## 2016-11-08 MED ORDER — TAMSULOSIN HCL 0.4 MG PO CAPS
0.4000 mg | ORAL_CAPSULE | Freq: Every day | ORAL | Status: DC
Start: 2016-11-09 — End: 2016-11-10
  Administered 2016-11-09 – 2016-11-10 (×2): 0.4 mg via ORAL
  Filled 2016-11-08 (×2): qty 1

## 2016-11-08 MED ORDER — ACETAMINOPHEN 325 MG PO TABS
650.0000 mg | ORAL_TABLET | Freq: Four times a day (QID) | ORAL | Status: DC | PRN
Start: 2016-11-08 — End: 2016-11-10

## 2016-11-08 MED ORDER — DARIFENACIN HYDROBROMIDE ER 7.5 MG PO TB24
7.5000 mg | ORAL_TABLET | Freq: Every day | ORAL | Status: DC
  Administered 2016-11-09 – 2016-11-10 (×2): 7.5 mg via ORAL
  Filled 2016-11-08 (×2): qty 1

## 2016-11-08 MED ORDER — ACETAMINOPHEN 650 MG RE SUPP: 650.0000 mg | Freq: Four times a day (QID) | RECTAL | Status: DC | PRN

## 2016-11-08 NOTE — ED Notes (Signed)
Attempted report x 1 unsuccessful

## 2016-11-08 NOTE — ED Notes (Signed)
While attempting to transport pt admitting provider arrived for his assessment. Pt to stay in room at this time until AP finished. [name] Kitchen
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
## 2016-11-08 NOTE — ED Notes (Signed)
Called pt to be triaged, no response.

## 2016-11-08 NOTE — ED Notes (Signed)
Attempted report x 3 unsuccessful
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
## 2016-11-08 NOTE — ED Notes (Signed)
Attempted report x 4 unsuccessful

## 2016-11-08 NOTE — ED Triage Notes (Addendum)
Pt arrives from home with wife after having two falls within in the last few hours. The first time the pt reports he fell back wards in the kitchen and hit the back of his head. Pt states then he was walking down his steps and fell face first onto a concrete stepping stone. Pt denies any LOC. Pt has no neck or back pain. Reports pain in the front of his head. Also has swelling in right elbow.pt has welling on right side of face.  Bleeding controlled with [place] and sterile gauze applied by urgent care. Pt is alert and ox4, ambulatory at triage.

## 2016-11-08 NOTE — ED Notes (Signed)
Attempted report unsuccessful

## 2016-11-09 ENCOUNTER — Observation Stay (HOSPITAL_COMMUNITY): Payer: Medicare Other

## 2016-11-09 DIAGNOSIS — S299XXA Unspecified injury of thorax, initial encounter: Secondary | ICD-10-CM | POA: Diagnosis not present

## 2016-11-09 DIAGNOSIS — Z85828 Personal history of other malignant neoplasm of skin: Secondary | ICD-10-CM | POA: Diagnosis not present

## 2016-11-09 DIAGNOSIS — Z794 Long term (current) use of insulin: Secondary | ICD-10-CM | POA: Diagnosis not present

## 2016-11-09 DIAGNOSIS — I2510 Atherosclerotic heart disease of native coronary artery without angina pectoris: Secondary | ICD-10-CM | POA: Diagnosis present

## 2016-11-09 DIAGNOSIS — Z950 Presence of cardiac pacemaker: Secondary | ICD-10-CM | POA: Diagnosis not present

## 2016-11-09 DIAGNOSIS — R55 Syncope and collapse: Secondary | ICD-10-CM | POA: Diagnosis not present

## 2016-11-09 DIAGNOSIS — S3993XA Unspecified injury of pelvis, initial encounter: Secondary | ICD-10-CM | POA: Diagnosis not present

## 2016-11-09 DIAGNOSIS — E1140 Type 2 diabetes mellitus with diabetic neuropathy, unspecified: Secondary | ICD-10-CM | POA: Diagnosis not present

## 2016-11-09 DIAGNOSIS — E1122 Type 2 diabetes mellitus with diabetic chronic kidney disease: Secondary | ICD-10-CM | POA: Diagnosis not present

## 2016-11-09 DIAGNOSIS — Z96651 Presence of right artificial knee joint: Secondary | ICD-10-CM | POA: Diagnosis present

## 2016-11-09 DIAGNOSIS — Y9301 Activity, walking, marching and hiking: Secondary | ICD-10-CM | POA: Diagnosis present

## 2016-11-09 DIAGNOSIS — Z89029 Acquired absence of unspecified finger(s): Secondary | ICD-10-CM | POA: Diagnosis not present

## 2016-11-09 DIAGNOSIS — E1165 Type 2 diabetes mellitus with hyperglycemia: Secondary | ICD-10-CM | POA: Diagnosis present

## 2016-11-09 DIAGNOSIS — I129 Hypertensive chronic kidney disease with stage 1 through stage 4 chronic kidney disease, or unspecified chronic kidney disease: Secondary | ICD-10-CM | POA: Diagnosis present

## 2016-11-09 DIAGNOSIS — I252 Old myocardial infarction: Secondary | ICD-10-CM | POA: Diagnosis not present

## 2016-11-09 DIAGNOSIS — Z7982 Long term (current) use of aspirin: Secondary | ICD-10-CM | POA: Diagnosis not present

## 2016-11-09 DIAGNOSIS — W010XXA Fall on same level from slipping, tripping and stumbling without subsequent striking against object, initial encounter: Secondary | ICD-10-CM | POA: Diagnosis present

## 2016-11-09 DIAGNOSIS — E785 Hyperlipidemia, unspecified: Secondary | ICD-10-CM | POA: Diagnosis present

## 2016-11-09 DIAGNOSIS — Z955 Presence of coronary angioplasty implant and graft: Secondary | ICD-10-CM | POA: Diagnosis not present

## 2016-11-09 DIAGNOSIS — E11319 Type 2 diabetes mellitus with unspecified diabetic retinopathy without macular edema: Secondary | ICD-10-CM | POA: Diagnosis present

## 2016-11-09 DIAGNOSIS — Z8673 Personal history of transient ischemic attack (TIA), and cerebral infarction without residual deficits: Secondary | ICD-10-CM | POA: Diagnosis not present

## 2016-11-09 DIAGNOSIS — R296 Repeated falls: Secondary | ICD-10-CM | POA: Diagnosis present

## 2016-11-09 DIAGNOSIS — D696 Thrombocytopenia, unspecified: Secondary | ICD-10-CM | POA: Diagnosis present

## 2016-11-09 DIAGNOSIS — I6200 Nontraumatic subdural hemorrhage, unspecified: Secondary | ICD-10-CM | POA: Diagnosis not present

## 2016-11-09 DIAGNOSIS — Z85820 Personal history of malignant melanoma of skin: Secondary | ICD-10-CM | POA: Diagnosis not present

## 2016-11-09 DIAGNOSIS — S51011A Laceration without foreign body of right elbow, initial encounter: Secondary | ICD-10-CM | POA: Diagnosis present

## 2016-11-09 DIAGNOSIS — S065X0A Traumatic subdural hemorrhage without loss of consciousness, initial encounter: Secondary | ICD-10-CM | POA: Diagnosis not present

## 2016-11-09 DIAGNOSIS — N183 Chronic kidney disease, stage 3 (moderate): Secondary | ICD-10-CM | POA: Diagnosis not present

## 2016-11-09 LAB — BASIC METABOLIC PANEL
ANION GAP: 10 (ref 5–15)
BUN: 28 mg/dL — ABNORMAL HIGH (ref 6–20)
CALCIUM: 8.8 mg/dL — AB (ref 8.9–10.3)
CO2: 21 mmol/L — ABNORMAL LOW (ref 22–32)
Chloride: 110 mmol/L (ref 101–111)
Creatinine, Ser: 1.68 mg/dL — ABNORMAL HIGH (ref 0.61–1.24)
GFR calc Af Amer: 42 mL/min — ABNORMAL LOW (ref >60)
GFR, EST NON AFRICAN AMERICAN: 36 mL/min — AB (ref >60)
Glucose, Bld: 150 mg/dL — ABNORMAL HIGH (ref 65–99)
POTASSIUM: 3.8 mmol/L (ref 3.5–5.1)
SODIUM: 141 mmol/L (ref 135–145)

## 2016-11-09 LAB — GLUCOSE, CAPILLARY
GLUCOSE-CAPILLARY: 137 mg/dL — AB (ref 65–99)
GLUCOSE-CAPILLARY: 68 mg/dL (ref 65–99)
Glucose-Capillary: 175 mg/dL — ABNORMAL HIGH (ref 65–99)
Glucose-Capillary: 260 mg/dL — ABNORMAL HIGH (ref 65–99)
Glucose-Capillary: 225 mg/dL — ABNORMAL HIGH (ref 65–99)
Glucose-Capillary: 280 mg/dL — ABNORMAL HIGH (ref 65–99)

## 2016-11-09 LAB — TROPONIN I
TROPONIN I: 0.03 ng/mL — AB (ref <0.03)
TROPONIN I: 0.03 ng/mL — AB (ref <0.03)

## 2016-11-09 LAB — CBC
HEMATOCRIT: 46.7 % (ref 39.0–52.0)
HEMOGLOBIN: 16.2 g/dL (ref 13.0–17.0)
MCH: 31.2 pg (ref 26.0–34.0)
MCHC: 34.7 g/dL (ref 30.0–36.0)
MCV: 89.8 fL (ref 78.0–100.0)
Platelets: 130 10*3/uL — ABNORMAL LOW (ref 150–400)
RBC: 5.2 MIL/uL (ref 4.22–5.81)
RDW: 13.7 % (ref 11.5–15.5)
WBC: 9 10*3/uL (ref 4.0–10.5)

## 2016-11-09 LAB — TSH: TSH: 3.75 u[IU]/mL (ref 0.350–4.500)

## 2016-11-09 MED ORDER — DEXTROSE 50 % IV SOLN
INTRAVENOUS | Status: AC
  Filled 2016-11-09: qty 50

## 2016-11-09 NOTE — Evaluation (Signed)
Physical Therapy Evaluation Patient Details Name: Frank Simpson MRN: [id number] DOB: 12-02-1930 Today's Date: 11/09/2016   History of Present Illness  Pt is an 82 y/o male who presents s/p falls at home. Pt fell x2 and sustained a SDH as well as an abrasion on his R elbow. X-Ray negative for fracture in elbow. PMH significant for glaucoma, CVA, DM, CHF, BPPV.  Clinical Impression  Pt admitted with above diagnosis. Pt currently with functional limitations due to the deficits listed below (see PT Problem List). At the time of PT eval pt was able to perform transfers and ambulation with min assist for balance support and safety. Recommend use of RW at this time instead of the [facility] for increased stability. Pt's wife present during session and reports that she does not want him "doing too much" at home, and he typically sits and watches TV throughout the day. Pt and wife were educated on the need for continued mobility for maintenance of strength, and the recommendation for [facility] therapies to begin to decrease risk of future falls. Pt will benefit from skilled PT to increase their independence and safety with mobility to allow discharge to the venue listed below.       Follow Up Recommendations Home health PT;Supervision/Assistance - 24 hour    Equipment Recommendations  None recommended by PT    Recommendations for Other Services       Precautions / Restrictions Precautions Precautions: Fall Restrictions Weight Bearing Restrictions: No      Mobility  Bed Mobility Overal bed mobility: Needs Assistance Bed Mobility: Supine to Sit     Supine to sit: Min assist     General bed mobility comments: Assist for trunk elevation to full sitting position. Pt attempted to use momentum to sit up, but required assist.   Transfers Overall transfer level: Needs assistance Equipment used: Rolling walker (2 wheeled) Transfers: Sit to/from Stand Sit to Stand: Min assist         General transfer  comment: Assistance provided as pt powered-up to full standing position from bed in lowest position. VC's provided for hand placement on seated surface for safety.   Ambulation/Gait Ambulation/Gait assistance: Min assist Ambulation Distance (Feet): 250 Feet Assistive device: Rolling walker (2 wheeled) Gait Pattern/deviations: Step-through pattern;Decreased stride length;Trunk flexed Gait velocity: Decreased Gait velocity interpretation: Below normal speed for age/gender General Gait Details: VC's for general safety as pt ambulated. He had difficulty with scanning the environment for obstacles and avoiding them with the walker. VC's for improved posture.   Stairs Stairs: Yes Stairs assistance: Min guard Stair Management: Two rails;Step to pattern;Alternating pattern;Forwards Number of Stairs: 5 General stair comments: Pt demonstrated stair negotiation without assistance.   Wheelchair Mobility    Modified Rankin (Stroke Patients Only)       Balance Overall balance assessment: Needs assistance Sitting-balance support: Feet supported;No upper extremity supported Sitting balance-Leahy Scale: Fair     Standing balance support: No upper extremity supported;During functional activity Standing balance-Leahy Scale: Poor Standing balance comment: Requires UE support to maintain standing balance.                              Pertinent Vitals/Pain Pain Assessment: Faces Faces Pain Scale: No hurt    Home Living Family/patient expects to be discharged to:: Private residence Living Arrangements: Spouse/significant other Available Help at Discharge: Family;Available 24 hours/day Type of Home: House Home Access: Stairs to enter Entrance Stairs-Rails: Left;Right;Can reach both  Entrance Stairs-Number of Steps: 2 Home Layout: One level Home Equipment: [place]ukes - 2 wheels;Cane - single point;Walker - 4 wheels      Prior Function Level of Independence: Needs assistance   Gait  / Transfers Assistance Needed: Uses walker and cane depending on need that day  ADL's / Homemaking Assistance Needed: Wife assists with dressing occasionally but was able to wash himself.         Hand Dominance   Dominant Hand: Right    Extremity/Trunk Assessment   Upper Extremity Assessment Upper Extremity Assessment: Generalized weakness    Lower Extremity Assessment Lower Extremity Assessment: Defer to PT evaluation    Cervical / Trunk Assessment Cervical / Trunk Assessment: Kyphotic;Other exceptions Cervical / Trunk Exceptions: Forward head posture with rounded shoulders  Communication   Communication: HOH  Cognition Arousal/Alertness: Awake/alert Behavior During Therapy: Flat affect Overall Cognitive Status: History of cognitive impairments - at baseline                 General Comments: Wife reports that pt was on life support for 2 days and since then has had decreased STM, difficulty differentiating left and right, as well as yes and no.     General Comments      Exercises     Assessment/Plan    PT Assessment Patient needs continued PT services  PT Problem List Decreased strength;Decreased range of motion;Decreased activity tolerance;Decreased balance;Decreased mobility;Decreased knowledge of use of DME;Decreased safety awareness;Decreased cognition;Decreased knowledge of precautions          PT Treatment Interventions DME instruction;Gait training;Stair training;Functional mobility training;Therapeutic activities;Therapeutic exercise;Neuromuscular re-education;Patient/family education    PT Goals (Current goals can be found in the Care Plan section)  Acute Rehab PT Goals Patient Stated Goal: Pt did not state goals. Wife wants him to be safe at home with no falls.  PT Goal Formulation: With patient/family Time For Goal Achievement: 11/16/16 Potential to Achieve Goals: Good    Frequency Min 3X/week   Barriers to discharge         Co-evaluation               End of Session Equipment Utilized During Treatment: Gait belt Activity Tolerance: Patient tolerated treatment well Patient left: in chair;with call bell/phone within reach;with chair alarm set;with family/visitor present Nurse Communication: Mobility status    Functional Assessment Tool Used: Clinical judgement Functional Limitation: Mobility: Walking and moving around Mobility: Walking and Moving Around Current Status [phone]): At least 20 percent but less than 40 percent impaired, limited or restricted Mobility: Walking and Moving Around Goal Status [phone]): At least 1 percent but less than 20 percent impaired, limited or restricted    Time: 9450-3888 PT Time Calculation (min) (ACUTE ONLY): 40 min   Charges:   PT Evaluation $PT Eval Moderate Complexity: 1 Procedure PT Treatments $Gait Training: 8-22 mins   PT G Codes:   PT G-Codes **NOT FOR INPATIENT CLASS** Functional Assessment Tool Used: Clinical judgement Functional Limitation: Mobility: Walking and moving around Mobility: Walking and Moving Around Current Status ([id number]): At least 20 percent but less than 40 percent impaired, limited or restricted Mobility: Walking and Moving Around Goal Status [phone]): At least 1 percent but less than 20 percent impaired, limited or restricted    Thelma Comp 11/09/2016, 1:14 PM  Rolinda Roan, PT, DPT Acute Rehabilitation Services Pager: [phone]

## 2016-11-09 NOTE — Care Management Obs Status (Signed)
[place] NOTIFICATION   Patient Details  Name: URIYAH RASKA MRN: [id number] Date of Birth: 1931-01-04   Medicare Observation Status Notification Given:  Yes    Pollie Friar, RN 11/09/2016, 12:26 PM

## 2016-11-09 NOTE — Progress Notes (Signed)
Critical lab: troponin 0.03.  MD Rai made aware.  Will continue to monitor.  Cori Razor, RN

## 2016-11-09 NOTE — Evaluation (Signed)
Occupational Therapy Evaluation Patient Details Name: Frank Simpson MRN: [id number] DOB: June 18, 1931 Today's Date: 11/09/2016    History of Present Illness Pt is an 82 y/o male who presents s/p falls at home. Pt fell x2 and sustained a SDH as well as an abrasion on his R elbow. X-Ray negative for fracture in elbow. PMH significant for glaucoma, CVA, DM, CHF, BPPV.   Clinical Impression   Pt is assisted for all IADL and with some aspects of bathing and dressing. He has baseline cognitive impairment per his wife and hx of multiple falls with head injuries. Pt likely very near his baseline. Recommending HHOT to assess for home safety and any equipment needs for ADL. No further acute OT needs.    Follow Up Recommendations  Home health OT;Supervision/Assistance - 24 hour    Equipment Recommendations  None recommended by OT    Recommendations for Other Services       Precautions / Restrictions Precautions Precautions: Fall Restrictions Weight Bearing Restrictions: No      Mobility Bed Mobility Overal bed mobility: Needs Assistance Bed Mobility: Supine to Sit     Supine to sit: Min assist     General bed mobility comments: Assist for trunk elevation to full sitting position. Pt attempted to use momentum to sit up, but required assist.   Transfers Overall transfer level: Needs assistance Equipment used: Rolling walker (2 wheeled) Transfers: Sit to/from Stand Sit to Stand: Min assist         General transfer comment: Assistance provided as pt powered-up to full standing position from bed in lowest position. VC's provided for hand placement on seated surface for safety.     Balance Overall balance assessment: Needs assistance Sitting-balance support: Feet supported;No upper extremity supported Sitting balance-Leahy Scale: Fair     Standing balance support: No upper extremity supported;During functional activity Standing balance-Leahy Scale: Poor Standing balance  comment: Requires UE support to maintain standing balance.                             ADL Overall ADL's : At baseline                                             Vision     Perception     Praxis      Pertinent Vitals/Pain Pain Assessment: Faces Faces Pain Scale: No hurt     Hand Dominance Right   Extremity/Trunk Assessment Upper Extremity Assessment Upper Extremity Assessment: Generalized weakness   Lower Extremity Assessment Lower Extremity Assessment: Defer to PT evaluation   Cervical / Trunk Assessment Cervical / Trunk Assessment: Kyphotic;Other exceptions Cervical / Trunk Exceptions: Forward head posture with rounded shoulders   Communication Communication Communication: HOH   Cognition Arousal/Alertness: Awake/alert Behavior During Therapy: Flat affect Overall Cognitive Status: History of cognitive impairments - at baseline                 General Comments: Wife reports that pt was on life support for 2 days and since then has had decreased STM, difficulty differentiating left and right, as well as yes and no.    General Comments       Exercises       Shoulder Instructions      Home Living Family/patient expects to be discharged to:: Private residence Living  Arrangements: Spouse/significant other Available Help at Discharge: Family;Available 24 hours/day Type of Home: House Home Access: Stairs to enter [employer] of Steps: 2 Entrance Stairs-Rails: Left;Right;Can reach both Home Layout: One level     Bathroom Shower/Tub: Occupational psychologist: Handicapped height Bathroom Accessibility: Yes   Home Equipment: Environmental consultant - 2 wheels;Cane - single point;Walker - 4 wheels          Prior Functioning/Environment Level of Independence: Needs assistance  Gait / Transfers Assistance Needed: Uses walker and cane depending on need that day ADL's / Homemaking Assistance Needed: Wife assists with  dressing occasionally but was able to wash himself.             OT Problem List: Decreased strength;Impaired balance (sitting and/or standing);Decreased knowledge of use of DME or AE;Decreased cognition;Decreased safety awareness   OT Treatment/Interventions:      OT Goals(Current goals can be found in the care plan section) Acute Rehab OT Goals Patient Stated Goal: Pt did not state goals. Wife wants him to be safe at home with no falls.   OT Frequency:     Barriers to D/C:            Co-evaluation              End of Session Equipment Utilized During Treatment: Gait belt;Rolling walker Nurse Communication:  (impaired cognition)  Activity Tolerance: Patient tolerated treatment well Patient left: in chair;with call bell/phone within reach;with chair alarm set;with family/visitor present   Time: 1100-1137 OT Time Calculation (min): 37 min Charges:  OT General Charges $OT Visit: 1 Procedure OT Evaluation $OT Eval Moderate Complexity: 1 Procedure G-Codes: OT G-codes **NOT FOR INPATIENT CLASS** Functional Assessment Tool Used: clinical judgement Functional Limitation: Self care Self Care Current Status (J8841): At least 40 percent but less than 60 percent impaired, limited or restricted Self Care Discharge Status [phone]): At least 40 percent but less than 60 percent impaired, limited or restricted  Malka So 11/09/2016, 1:18 PM [phone]

## 2016-11-09 NOTE — Care Management Note (Signed)
Case Management Note  Patient Details  Name: Frank Simpson MRN: [id number] Date of Birth: Dec 20, 1930  Subjective/Objective:    Pt in with falls and SDH. He is from home with his wife.                 Action/Plan: Awaiting PT/OT recommendations. CM following for d/c needs.   Expected Discharge Date:                  Expected Discharge Plan:     In-House Referral:     Discharge planning Services     Post Acute Care Choice:    Choice offered to:     DME Arranged:    DME Agency:     HH Arranged:    HH Agency:     Status of Service:  In process, will continue to follow  If discussed at Long Length of Stay Meetings, dates discussed:    Additional Comments:  Pollie Friar, RN 11/09/2016, 11:14 AM

## 2016-11-09 NOTE — Progress Notes (Signed)
[facility]                                                                              Patient Demographics  Frank Simpson, is a 82 y.o. male, DOB - [birth year], RCB:[id number]  Admit date - 11/08/2016   Admitting Physician Rise Patience, MD  Outpatient Primary MD for the patient is [name], MD  Outpatient specialists:   LOS - 0  days    Chief Complaint  Patient presents with  . Fall       Brief summary    Frank Simpson is a 82 y.o. male with history of CAD status post stenting, symptomatic bradycardia status post pacemaker placement, hypertension, diabetes mellitus and chronic kidney disease was brought to the ER after patient was witnessed to have 2 falls at his home, Hitting his head with the first fall. CT head in the ER showed subdural hematoma. Patient was admitted for further workup. Neurosurgery was also consulted.  Assessment & Plan    Principal Problem:   SDH (subdural hematoma) (HCC) - Multiple falls recently, 2 falls at home yesterday - Neurosurgery, Dr. Cyndy Freeze was consulted in ED, recommended repeating CT head. Repeat CT head showed stable falls sign subdural hematoma, no mass effect, no new hemorrhage - Continue to hold aspirin  - Patient needs to have PTOT evaluation, having multiple falls recently, likely needs a higher level of care  Active Problems:   Type 2 diabetes, controlled, with neuropathy (HCC) - Continue Lantus, sliding scale insulin, follow CBGs    CKD stage 3 due to type 2 diabetes mellitus (HCC) - Creatinine currently stable, baseline creatinine appears to be 1.7-1.9 - Creatinine currently 1.6    CAD (coronary artery disease) -No chest pain or shortness of breath, patient denies syncopal episode - Hold aspirin due to subdural hematoma, continue statin  Hypertension - Stable, continue amlodipine, hydralazine, Imdur   Thrombocytopenia - Follow counts closely  Code Status: full  DVT Prophylaxis:   SCD's Family Communication: Discussed in detail with the patient, all imaging results, lab results explained to the patient   Disposition Plan: PT eval pending, will likely need higher level of care  Time Spent in minutes 25 minutes  Procedures:  CT head  Consultants:   Neurosurgery  Antimicrobials:      Medications  Scheduled Meds: . amLODipine  5 mg Oral Daily  . darifenacin  7.5 mg Oral Daily  . insulin aspart  0-9 Units Subcutaneous TID WC  . insulin glargine  50 Units Subcutaneous Daily  . isosorbide mononitrate  30 mg Oral Daily  . loratadine  10 mg Oral Daily  . mirabegron ER  25 mg Oral Daily  . rosuvastatin  20 mg Oral Daily  . sertraline  25 mg Oral Daily  . tamsulosin  0.4 mg Oral Daily   Continuous Infusions: . sodium chloride     PRN Meds:.acetaminophen **OR** acetaminophen, meclizine, nitroGLYCERIN, ondansetron **OR** ondansetron (ZOFRAN) IV   Antibiotics   Anti-infectives    None        Subjective:   Frank Simpson was seen and examined today. He denies any headache or blurry  vision.  Patient denies dizziness, chest pain, shortness of breath, abdominal pain, N/V/D/C, new weakness, numbess, tingling. No acute events overnight.    Objective:   Vitals:   11/09/16 0118 11/09/16 0300 11/09/16 0533 11/09/16 0928  BP: (!) 174/90 (!) 162/87 (!) 161/89 124/64  Pulse: 79 73 82 (!) 56  Resp: _0 Temp: 98.1 F (36.7 C)  98.6 F (37 C) 98.1 F (36.7 C)  TempSrc: Oral  Oral Oral  SpO2: 98% 94% 95% 95%  Weight:      Height:       No intake or output data in the 24 hours ending 11/09/16 1135   Wt Readings from Last 3 Encounters:  11/08/16 103.1 kg (227 lb 6.4 oz)  10/18/16 103.9 kg (229 lb)  08/15/16 102.3 kg (225 lb 8 oz)     Exam  General: Alert and oriented x 3, NAD  HEENT:    Neck:   Cardiovascular: S1 S2 auscultated, no rubs, murmurs or gallops. Regular rate and rhythm.  Respiratory: Clear to auscultation bilaterally, no  wheezing, rales or rhonchi  Gastrointestinal: Soft, nontender, nondistended, + bowel sounds  Ext: no cyanosis clubbing or edema, Small abrasion on the left elbow   Neuro: AAOx3, Cr N's II- XII. Strength 5/5 upper and lower extremities bilaterally  Skin: No rashes  Psych: Normal affect and demeanor, alert and oriented x3    Data Reviewed:  I have personally reviewed following labs and imaging studies  Micro Results No results found for this or any previous visit (from the past 240 hour(s)).  Radiology Reports Dg Elbow Complete Right  Result Date: 11/08/2016 CLINICAL DATA:  Multiple falls this afternoon.  Head and elbow pain. EXAM: RIGHT ELBOW - COMPLETE 3+ VIEW COMPARISON:  None. FINDINGS: No acute fracture deformity or dislocation. Joint space intact without erosions. No destructive bony lesions. Posterior elbow soft tissue swelling with overlying bandage. Tiny bubbles subcutaneous gas without radiopaque foreign bodies. IMPRESSION: Posterior elbow soft tissue swelling and laceration. No acute osseous process. Electronically Signed   By: Elon Alas M.D.   On: 11/08/2016 18:46   Ct Head Wo Contrast  Result Date: 11/09/2016 CLINICAL DATA:  Falcine subdural hematoma EXAM: CT HEAD WITHOUT CONTRAST TECHNIQUE: Contiguous axial images were obtained from the base of the skull through the vertex without intravenous contrast. COMPARISON:  November 08, 2016 FINDINGS: Brain: Mild diffuse atrophy is stable. The falcine subdural hematoma described 1 day prior appears stable. There is subdural hematoma along the falx measures up to 5 mm, unchanged from previous study. Noted new subdural fluid collections seen. There is no epidural fluid. There is no subarachnoid hemorrhage or parenchymal hematoma evident on this study. There is no appreciable mass effect. There is no midline shift. There is patchy small vessel disease in the centra semiovale bilaterally. No new gray-white compartment lesions  evident. No acute infarct evident. Vascular: The middle cerebral arteries demonstrate symmetric attenuation bilaterally. No hyperdense vessel evident. There is calcification in each carotid siphon region. Skull: The bony calvarium appears intact. Sinuses/Orbits: There is mucosal thickening in multiple ethmoid air cells bilaterally. There is mucosal thickening in the anterior and medial left sphenoid sinus regions. Orbits appear symmetric bilaterally. Other: Mastoid air cells are clear. There is debris in the right external auditory canal. IMPRESSION: Stable falcine subdural hematoma. No appreciable mass effect from this hematoma. No new hemorrhage is seen on this study. No midline shift. No edema or mass effect. There is atrophy with patchy periventricular small  vessel disease, stable. No acute infarct evident. There is multifocal paranasal sinus disease. There is apparent cerumen in the right external auditory canal. There are foci of arterial vascular calcification. Electronically Signed   By: Lowella Grip III M.D.   On: 11/09/2016 07:31   Ct Head Wo Contrast  Result Date: 11/08/2016 CLINICAL DATA:  Fall history of syncope and hypertension EXAM: CT HEAD WITHOUT CONTRAST TECHNIQUE: Contiguous axial images were obtained from the base of the skull through the vertex without intravenous contrast. COMPARISON:  02/16/2016 FINDINGS: Brain: No acute territorial infarction or intracranial mass is visualized. There is a thin anterior inter hemispheric subdural hematoma along the falx, measuring 4 mm in maximum thickness. No midline shift. Mild atrophy. Mild periventricular white matter hypodensity consistent with small vessel disease. Ventricles are similar in size and morphology. Vascular: No hyperdense vessels. There are carotid artery calcifications. Skull: Mastoid air cells are clear.  No skull fracture is seen. Sinuses/Orbits: Moderate-to-marked mucosal thickening and opacification of the left maxillary  sinus. Mild mucosal thickening in the ethmoid sinuses. No acute orbital abnormality. Other: None IMPRESSION: 4 mm thick anterior interhemispheric acute subdural hematoma along the falx. No significant mass effect or midline shift. Atrophy. Mild periventricular white matter hypodensity consistent with small vessel change. Moderate-to-marked mucosal thickening of left maxillary sinus. Critical Value/emergent results were called by telephone at the time of interpretation on 11/08/2016 at 6:22 pm to Dr. Isla Pence , who verbally acknowledged these results. Electronically Signed   By: Donavan Foil M.D.   On: 11/08/2016 18:22   Dg Pelvis Portable  Result Date: 11/09/2016 CLINICAL DATA:  Fall. EXAM: PORTABLE PELVIS 1-2 VIEWS COMPARISON:  CT 10/03/2016.  AP pelvis 10/03/2014 . FINDINGS: Penile prosthesis. Degenerative changes lumbar spine and both hips. No acute bony or joint abnormality identified. Peripheral vascular calcification. Pelvic calcifications consistent phleboliths. Degenerative changes lumbar spine and both hips. IMPRESSION: Degenerative changes lumbar spine and both hips. Electronically Signed   By: Marcello Moores  Register   On: 11/09/2016 07:36   Dg Chest Port 1 View  Result Date: 11/09/2016 CLINICAL DATA:  Frequent falls. EXAM: PORTABLE CHEST 1 VIEW COMPARISON:  03/10/2015. FINDINGS: Cardiac pacer with lead tip in right atrium right ventricle. Cardiomegaly with normal pulmonary vascularity. Lungs are clear. No pleural effusion or pneumothorax. No acute bony abnormality identified. Degenerative changes thoracic spine and both shoulders. IMPRESSION: 1. Cardiac pacer with lead tips in right atrium right ventricle. Cardiomegaly with normal pulmonary vascularity. 2.   Low lung volumes with mild basilar atelectasis. Electronically Signed   By: Marcello Moores  Register   On: 11/09/2016 07:40    Lab Data:  CBC:  Recent Labs Lab 11/08/16 1758 11/09/16 0258  WBC 13.0* 9.0  HGB 16.7 16.2  HCT 48.5 46.7    MCV 90.1 89.8  PLT 146* 761*   Basic Metabolic Panel:  Recent Labs Lab 11/08/16 1758 11/09/16 0258  NA 137 141  K 4.3 3.8  CL 103 110  CO2 24 21*  GLUCOSE 369* 150*  BUN 32* 28*  CREATININE 2.02* 1.68*  CALCIUM 9.1 8.8*   GFR: Estimated Creatinine Clearance: 41.4 mL/min (by C-G formula based on SCr of 1.68 mg/dL (H)). Liver Function Tests: No results for input(s): AST, ALT, ALKPHOS, BILITOT, PROT, ALBUMIN in the last 168 hours. No results for input(s): LIPASE, AMYLASE in the last 168 hours. No results for input(s): AMMONIA in the last 168 hours. Coagulation Profile: No results for input(s): INR, PROTIME in the last 168 hours. Cardiac Enzymes: No results  for input(s): CKTOTAL, CKMB, CKMBINDEX, TROPONINI in the last 168 hours. BNP (last 3 results) No results for input(s): PROBNP in the last 8760 hours. HbA1C: No results for input(s): HGBA1C in the last 72 hours. CBG:  Recent Labs Lab 11/08/16 1913 11/08/16 2159 11/09/16 0313 11/09/16 0640 11/09/16 0832  GLUCAP 378* 284* 137* 68 175*   Lipid Profile: No results for input(s): CHOL, HDL, LDLCALC, TRIG, CHOLHDL, LDLDIRECT in the last 72 hours. Thyroid Function Tests:  Recent Labs  11/09/16 0258  TSH 3.750   Anemia Panel: No results for input(s): VITAMINB12, FOLATE, FERRITIN, TIBC, IRON, RETICCTPCT in the last 72 hours. Urine analysis:    Component Value Date/Time   COLORURINE YELLOW 11/08/2016 2043   APPEARANCEUR CLEAR 11/08/2016 2043   LABSPEC 1.024 11/08/2016 2043   PHURINE 5.0 11/08/2016 2043   GLUCOSEU >=500 (A) 11/08/2016 2043   HGBUR MODERATE (A) 11/08/2016 2043   BILIRUBINUR NEGATIVE 11/08/2016 2043   BILIRUBINUR small (A) 06/13/2016 1240   BILIRUBINUR Negative 05/09/2016 0956   KETONESUR NEGATIVE 11/08/2016 2043   PROTEINUR >=300 (A) 11/08/2016 2043   UROBILINOGEN negative 06/13/2016 1240   UROBILINOGEN 0.2 03/10/2015 1733   NITRITE NEGATIVE 11/08/2016 2043   LEUKOCYTESUR NEGATIVE 11/08/2016  2043     Frank Simpson M.D. [facility] 11/09/2016, 11:35 AM  Pager: [phone] Between 7am to 7pm - call Pager - [phone]  After 7pm go to [email or web address] - password [email or web address]  Call night coverage person covering after 7pm

## 2016-11-10 ENCOUNTER — Inpatient Hospital Stay (HOSPITAL_COMMUNITY): Payer: Medicare Other

## 2016-11-10 DIAGNOSIS — E1122 Type 2 diabetes mellitus with diabetic chronic kidney disease: Secondary | ICD-10-CM

## 2016-11-10 DIAGNOSIS — N183 Chronic kidney disease, stage 3 (moderate): Secondary | ICD-10-CM

## 2016-11-10 DIAGNOSIS — E1140 Type 2 diabetes mellitus with diabetic neuropathy, unspecified: Secondary | ICD-10-CM

## 2016-11-10 DIAGNOSIS — I6200 Nontraumatic subdural hemorrhage, unspecified: Secondary | ICD-10-CM

## 2016-11-10 LAB — GLUCOSE, CAPILLARY
GLUCOSE-CAPILLARY: 239 mg/dL — AB (ref 65–99)
Glucose-Capillary: 112 mg/dL — ABNORMAL HIGH (ref 65–99)
Glucose-Capillary: 195 mg/dL — ABNORMAL HIGH (ref 65–99)

## 2016-11-10 LAB — TROPONIN I: Troponin I: 0.03 ng/mL (ref <0.03)

## 2016-11-10 LAB — ECHOCARDIOGRAM COMPLETE
HEIGHTINCHES: 72 in
WEIGHTICAEL: 3638.4 [oz_av]

## 2016-11-10 NOTE — Progress Notes (Signed)
Echocardiogram 2D Echocardiogram has been performed.  Frank Simpson 11/10/2016, 1:55 PM

## 2016-11-10 NOTE — Care Management Note (Signed)
Case Management Note  Patient Details  Name: Frank Simpson MRN: [id number] Date of Birth: 1931-03-13  Subjective/Objective:                 Spoke with patient's wife in room. Provided choice for [facility] services. Would like to follow up with [facility] for RN and PT only. Patient has cane and walker, decline further DME needs.    Action/Plan:  DC to home with wife with [facility], referral made to [employer], clinical liaison [facility].   Expected Discharge Date:  11/10/16               Expected Discharge Plan:  [place]  In-House Referral:     Discharge planning Services  CM Consult  Post Acute Care Choice:  Home Health Choice offered to:  Spouse  DME Arranged:    DME Agency:     HH Arranged:  RN, PT [place] Agency:  [facility] (now [facility])  Status of Service:  Completed, signed off  If discussed at [place] of Stay Meetings, dates discussed:    Additional Comments:  Carles Collet, RN 11/10/2016, 11:06 AM

## 2016-11-11 ENCOUNTER — Telehealth: Payer: Self-pay

## 2016-11-11 NOTE — Discharge Summary (Signed)
Physician Discharge Summary  Frank Simpson:[id number] DOB: 01/25/1931 DOA: 11/08/2016  PCP: [name], MD  Admit date: 11/08/2016 Discharge date: 11/10/2016  Admitted From: Home.  Disposition:  HOme.   Recommendations for Outpatient Follow-up:  1. Follow up with PCP in 1-2 weeks 2. Please obtain BMP/CBC in one week   Home Health:yes  Discharge Condition:guarded.  CODE STATUS:full code.  Diet recommendation: Heart Healthy    Brief/Interim Summary: Frank Simpson a 82 y.o.malewith history of CAD status post stenting, symptomatic bradycardia status post pacemaker placement, hypertension, diabetes mellitus and chronic kidney disease was brought to the ER after patient was witnessed to have 2 falls at his home, Hitting his head with the first fall. CT head in the ER showed subdural hematoma. Patient was admitted for further workup. Neurosurgery was also consulted.  Discharge Diagnoses:  Principal Problem:   SDH (subdural hematoma) (HCC) Active Problems:   Type 2 diabetes, controlled, with neuropathy ([place])   CKD stage 3 due to type 2 diabetes mellitus (HCC)   CAD (coronary artery disease)  Principal Problem:   SDH (subdural hematoma) (HCC) - Multiple falls recently, 2 falls at home  - Neurosurgery, Dr. Cyndy Freeze was consulted in ED, recommended repeating CT head. Repeat CT head showed stable falls sign subdural hematoma, no mass effect, no new hemorrhage -hold aspirin on discharge and PT/OT eval recommended.   Active Problems:   Type 2 diabetes, controlled, with neuropathy (HCC) - Continue Lantus, sliding scale insulin, follow CBGs CBG (last 3)   Recent Labs  11/10/16 0136 11/10/16 0618 11/10/16 1106  GLUCAP 239* 112* 195*        CKD stage 3 due to type 2 diabetes mellitus (HCC) - Creatinine currently stable, baseline creatinine appears to be 1.7-1.9 - Creatinine currently 1.6    CAD (coronary artery disease) -No chest pain or shortness of breath,  patient denies syncopal episode - Hold aspirin due to subdural hematoma, continue statin  Hypertension - Stable, continue amlodipine, hydralazine, Imdur   Thrombocytopenia - Follow counts closely  Discharge Instructions  Discharge Instructions    Diet - low sodium heart healthy    Complete by:  As directed    Discharge instructions    Complete by:  As directed    Follow up with PCP in one week.  We have stopped aspirin in view of your sub dural hematoma, restart it after speaking with your PCP.     Allergies as of 11/10/2016      Reactions   Metformin And Related Other (See Comments)   Affected kidneys   Oxycodone Other (See Comments)   Severe confusion   Januvia [sitagliptin Phosphate] Other (See Comments)   Possibly affected kidneys?   Penicillins Other (See Comments)   Reaction long time ago - doesn't remember      Medication List    STOP taking these medications   aspirin 81 MG chewable tablet   Insulin Pen Needle 31G X 8 MM Misc   Insulin Syringe-Needle U-100 31G X 5/16" 0.5 ML Misc Commonly known as:  BD INSULIN SYRINGE ULTRAFINE   ONE TOUCH LANCETS Misc   ONE TOUCH ULTRA TEST test strip Generic drug:  glucose blood   solifenacin 5 MG tablet Commonly known as:  VESICARE     TAKE these medications   acetaminophen 500 MG tablet Commonly known as:  TYLENOL Take 500 mg by mouth every 6 (six) hours as needed (pain).   amLODipine 5 MG tablet Commonly known as:  NORVASC Take  1 tablet (5 mg total) by mouth daily.   BASAGLAR KWIKPEN 100 UNIT/ML Sopn Inject 0.5 mLs (50 Units total) into the skin daily.   CLEAR EYES OP Place 1 drop into both eyes 3 (three) times daily.   fexofenadine 180 MG tablet Commonly known as:  ALLEGRA Take 180 mg by mouth daily as needed for allergies.   HUMALOG KWIKPEN 100 UNIT/ML KiwkPen Generic drug:  insulin lispro INJECT 4 UNITS WITH        BREAKFAST, 8 UNITS WITH    LUNCH/DINNER PLUS SLIDING  SCALE (NEW DIRECTIONS)    isosorbide mononitrate 30 MG 24 hr tablet Commonly known as:  IMDUR Take 1 tablet (30 mg total) by mouth daily.   meclizine 25 MG tablet Commonly known as:  ANTIVERT Take 25 mg by mouth every 6 (six) hours as needed for dizziness.   mirabegron ER 25 MG Tb24 tablet Commonly known as:  MYRBETRIQ Take 1 tablet (25 mg total) by mouth daily.   nitroGLYCERIN 0.4 MG SL tablet Commonly known as:  NITROSTAT Place 1 tablet (0.4 mg total) under the tongue every 5 (five) minutes as needed for chest pain. Up to three doses,if pain continues call 911   rosuvastatin 20 MG tablet Commonly known as:  CRESTOR Take 1 tablet (20 mg total) by mouth daily.   sertraline 25 MG tablet Commonly known as:  ZOLOFT Take 0.5 tablets (25 mg total) by mouth daily.   tamsulosin 0.4 MG Caps capsule Commonly known as:  FLOMAX Take 1 capsule (0.4 mg total) by mouth daily.      Follow-up Information    [facility] Follow up.   Why:  For home health services, they will contact you in the next 1-2 days to set up first home appointment. Contact information: [place] [address] [phone]        [name], MD. Schedule an appointment as soon as possible for a visit in 1 week(s).   Specialty:  Family Medicine Contact information: [place] [address] [phone]          Allergies  Allergen Reactions  . Metformin And Related Other (See Comments)    Affected kidneys  . Oxycodone Other (See Comments)    Severe confusion  . Januvia [Sitagliptin Phosphate] Other (See Comments)    Possibly affected kidneys?  . Penicillins Other (See Comments)    Reaction long time ago - doesn't remember    Consultations:  none   Procedures/Studies: Dg Elbow Complete Right  Result Date: 11/08/2016 CLINICAL DATA:  Multiple falls this afternoon.  Head and elbow pain. EXAM: RIGHT ELBOW - COMPLETE 3+ VIEW COMPARISON:  None. FINDINGS: No  acute fracture deformity or dislocation. Joint space intact without erosions. No destructive bony lesions. Posterior elbow soft tissue swelling with overlying bandage. Tiny bubbles subcutaneous gas without radiopaque foreign bodies. IMPRESSION: Posterior elbow soft tissue swelling and laceration. No acute osseous process. Electronically Signed   By: Elon Alas M.D.   On: 11/08/2016 18:46   Ct Head Wo Contrast  Result Date: 11/09/2016 CLINICAL DATA:  Falcine subdural hematoma EXAM: CT HEAD WITHOUT CONTRAST TECHNIQUE: Contiguous axial images were obtained from the base of the skull through the vertex without intravenous contrast. COMPARISON:  November 08, 2016 FINDINGS: Brain: Mild diffuse atrophy is stable. The falcine subdural hematoma described 1 day prior appears stable. There is subdural hematoma along the falx measures up to 5 mm, unchanged from previous study. Noted new  subdural fluid collections seen. There is no epidural fluid. There is no subarachnoid hemorrhage or parenchymal hematoma evident on this study. There is no appreciable mass effect. There is no midline shift. There is patchy small vessel disease in the centra semiovale bilaterally. No new gray-white compartment lesions evident. No acute infarct evident. Vascular: The middle cerebral arteries demonstrate symmetric attenuation bilaterally. No hyperdense vessel evident. There is calcification in each carotid siphon region. Skull: The bony calvarium appears intact. Sinuses/Orbits: There is mucosal thickening in multiple ethmoid air cells bilaterally. There is mucosal thickening in the anterior and medial left sphenoid sinus regions. Orbits appear symmetric bilaterally. Other: Mastoid air cells are clear. There is debris in the right external auditory canal. IMPRESSION: Stable falcine subdural hematoma. No appreciable mass effect from this hematoma. No new hemorrhage is seen on this study. No midline shift. No edema or mass effect. There  is atrophy with patchy periventricular small vessel disease, stable. No acute infarct evident. There is multifocal paranasal sinus disease. There is apparent cerumen in the right external auditory canal. There are foci of arterial vascular calcification. Electronically Signed   By: Lowella Grip III M.D.   On: 11/09/2016 07:31   Ct Head Wo Contrast  Result Date: 11/08/2016 CLINICAL DATA:  Fall history of syncope and hypertension EXAM: CT HEAD WITHOUT CONTRAST TECHNIQUE: Contiguous axial images were obtained from the base of the skull through the vertex without intravenous contrast. COMPARISON:  02/16/2016 FINDINGS: Brain: No acute territorial infarction or intracranial mass is visualized. There is a thin anterior inter hemispheric subdural hematoma along the falx, measuring 4 mm in maximum thickness. No midline shift. Mild atrophy. Mild periventricular white matter hypodensity consistent with small vessel disease. Ventricles are similar in size and morphology. Vascular: No hyperdense vessels. There are carotid artery calcifications. Skull: Mastoid air cells are clear.  No skull fracture is seen. Sinuses/Orbits: Moderate-to-marked mucosal thickening and opacification of the left maxillary sinus. Mild mucosal thickening in the ethmoid sinuses. No acute orbital abnormality. Other: None IMPRESSION: 4 mm thick anterior interhemispheric acute subdural hematoma along the falx. No significant mass effect or midline shift. Atrophy. Mild periventricular white matter hypodensity consistent with small vessel change. Moderate-to-marked mucosal thickening of left maxillary sinus. Critical Value/emergent results were called by telephone at the time of interpretation on 11/08/2016 at 6:22 pm to Dr. Isla Pence , who verbally acknowledged these results. Electronically Signed   By: Donavan Foil M.D.   On: 11/08/2016 18:22   Dg Pelvis Portable  Result Date: 11/09/2016 CLINICAL DATA:  Fall. EXAM: PORTABLE PELVIS 1-2  VIEWS COMPARISON:  CT 10/03/2016.  AP pelvis 10/03/2014 . FINDINGS: Penile prosthesis. Degenerative changes lumbar spine and both hips. No acute bony or joint abnormality identified. Peripheral vascular calcification. Pelvic calcifications consistent phleboliths. Degenerative changes lumbar spine and both hips. IMPRESSION: Degenerative changes lumbar spine and both hips. Electronically Signed   By: Marcello Moores  Register   On: 11/09/2016 07:36   Dg Chest Port 1 View  Result Date: 11/09/2016 CLINICAL DATA:  Frequent falls. EXAM: PORTABLE CHEST 1 VIEW COMPARISON:  03/10/2015. FINDINGS: Cardiac pacer with lead tip in right atrium right ventricle. Cardiomegaly with normal pulmonary vascularity. Lungs are clear. No pleural effusion or pneumothorax. No acute bony abnormality identified. Degenerative changes thoracic spine and both shoulders. IMPRESSION: 1. Cardiac pacer with lead tips in right atrium right ventricle. Cardiomegaly with normal pulmonary vascularity. 2.   Low lung volumes with mild basilar atelectasis. Electronically Signed   By: Marcello Moores  Register  On: 11/09/2016 07:40       Subjective: No complaints.   Discharge Exam: Vitals:   11/10/16 0928 11/10/16 1318  BP: (!) 159/83 122/64  Pulse: 80 62  Resp: 18 18  Temp: 98.5 F (36.9 C) 98.7 F (37.1 C)   Vitals:   11/10/16 0100 11/10/16 0517 11/10/16 0928 11/10/16 1318  BP: (!) 154/71 (!) 161/67 (!) 159/83 122/64  Pulse: 80 76 80 62  Resp: _0 Temp: 98.9 F (37.2 C) 98.6 F (37 C) 98.5 F (36.9 C) 98.7 F (37.1 C)  TempSrc: Oral Oral Axillary Oral  SpO2: 96% 94% 95% 94%  Weight:      Height:        General: Pt is alert, awake, not in acute distress Cardiovascular: RRR, S1/S2 +, no rubs, no gallops Respiratory: CTA bilaterally, no wheezing, no rhonchi Abdominal: Soft, NT, ND, bowel sounds + Extremities: no edema, no cyanosis    The results of significant diagnostics from this hospitalization (including imaging,  microbiology, ancillary and laboratory) are listed below for reference.     Microbiology: No results found for this or any previous visit (from the past 240 hour(s)).   Labs: BNP (last 3 results) No results for input(s): BNP in the last 8760 hours. Basic Metabolic Panel:  Recent Labs Lab 11/08/16 1758 11/09/16 0258  NA 137 141  K 4.3 3.8  CL 103 110  CO2 24 21*  GLUCOSE 369* 150*  BUN 32* 28*  CREATININE 2.02* 1.68*  CALCIUM 9.1 8.8*   Liver Function Tests: No results for input(s): AST, ALT, ALKPHOS, BILITOT, PROT, ALBUMIN in the last 168 hours. No results for input(s): LIPASE, AMYLASE in the last 168 hours. No results for input(s): AMMONIA in the last 168 hours. CBC:  Recent Labs Lab 11/08/16 1758 11/09/16 0258  WBC 13.0* 9.0  HGB 16.7 16.2  HCT 48.5 46.7  MCV 90.1 89.8  PLT 146* 130*   Cardiac Enzymes:  Recent Labs Lab 11/09/16 1247 11/09/16 1842 11/10/16 0157  TROPONINI 0.03* 0.03* 0.03*   BNP: Invalid input(s): POCBNP CBG:  Recent Labs Lab 11/09/16 1636 11/09/16 2134 11/10/16 0136 11/10/16 0618 11/10/16 1106  GLUCAP 225* 280* 239* 112* 195*   D-Dimer No results for input(s): DDIMER in the last 72 hours. Hgb A1c No results for input(s): HGBA1C in the last 72 hours. Lipid Profile No results for input(s): CHOL, HDL, LDLCALC, TRIG, CHOLHDL, LDLDIRECT in the last 72 hours. Thyroid function studies  Recent Labs  11/09/16 0258  TSH 3.750   Anemia work up No results for input(s): VITAMINB12, FOLATE, FERRITIN, TIBC, IRON, RETICCTPCT in the last 72 hours. Urinalysis    Component Value Date/Time   COLORURINE YELLOW 11/08/2016 2043   APPEARANCEUR CLEAR 11/08/2016 2043   LABSPEC 1.024 11/08/2016 2043   PHURINE 5.0 11/08/2016 2043   GLUCOSEU >=500 (A) 11/08/2016 2043   HGBUR MODERATE (A) 11/08/2016 2043   BILIRUBINUR NEGATIVE 11/08/2016 2043   BILIRUBINUR small (A) 06/13/2016 1240   BILIRUBINUR Negative 05/09/2016 0956   KETONESUR  NEGATIVE 11/08/2016 2043   PROTEINUR >=300 (A) 11/08/2016 2043   UROBILINOGEN negative 06/13/2016 1240   UROBILINOGEN 0.2 03/10/2015 1733   NITRITE NEGATIVE 11/08/2016 2043   LEUKOCYTESUR NEGATIVE 11/08/2016 2043   Sepsis Labs Invalid input(s): PROCALCITONIN,  WBC,  LACTICIDVEN Microbiology No results found for this or any previous visit (from the past 240 hour(s)).   Time coordinating discharge: Over 30 minutes  SIGNED:   Hosie Poisson, MD  [facility] 11/11/2016,  8:51 AM Pager   If 7PM-7AM, please contact night-coverage [email or web address] Password [email or web address]

## 2016-11-11 NOTE — Telephone Encounter (Signed)
Called pt to complete TCM and schedule hospital f/u with PCP. No answer or voicemail.

## 2016-11-12 DIAGNOSIS — E1140 Type 2 diabetes mellitus with diabetic neuropathy, unspecified: Secondary | ICD-10-CM | POA: Diagnosis not present

## 2016-11-12 DIAGNOSIS — I25110 Atherosclerotic heart disease of native coronary artery with unstable angina pectoris: Secondary | ICD-10-CM | POA: Diagnosis not present

## 2016-11-12 DIAGNOSIS — N183 Chronic kidney disease, stage 3 (moderate): Secondary | ICD-10-CM | POA: Diagnosis not present

## 2016-11-12 DIAGNOSIS — S065X9D Traumatic subdural hemorrhage with loss of consciousness of unspecified duration, subsequent encounter: Secondary | ICD-10-CM | POA: Diagnosis not present

## 2016-11-12 DIAGNOSIS — E1122 Type 2 diabetes mellitus with diabetic chronic kidney disease: Secondary | ICD-10-CM | POA: Diagnosis not present

## 2016-11-12 DIAGNOSIS — I110 Hypertensive heart disease with heart failure: Secondary | ICD-10-CM | POA: Diagnosis not present

## 2016-11-14 ENCOUNTER — Telehealth: Payer: Self-pay | Admitting: *Deleted

## 2016-11-14 NOTE — Telephone Encounter (Signed)
Ok to do. Thanks.

## 2016-11-14 NOTE — Telephone Encounter (Signed)
Gave ok order to [place].

## 2016-11-14 NOTE — Telephone Encounter (Signed)
Frank Simpson [id number]) left a voicemail stating that they admitted to their care Saturday the patient for a fall and head injury. Frank Simpson stated that they were requested to do nursing and Physical Therapy. Frank Simpson wants to make sure that Dr. Darnell Level will sign the orders for them.  Frank Simpson requested a call back.

## 2016-11-14 NOTE — Telephone Encounter (Signed)
Spoke to patient and spouse by telephone. Transition Care Management Follow-up Telephone Call   Date discharged? 11/10/16   How have you been since you were released from the hospital? "Doing better"   Do you understand why you were in the hospital? yes   Do you understand the discharge instructions? yes   Where were you discharged to?  Home, having home health come out [place]   Items Reviewed:  Medications reviewed: yes, Only change was to stop the baby aspirin  Allergies reviewed: yes  Dietary changes reviewed: no changes  Referrals reviewed: no   Functional Questionnaire:   Activities of Daily Living (ADLs):   He states they are independent in the following: None, assitance from wife States they require assistance with the following: ambulation, bathing and hygiene, feeding, continence, grooming, toileting and dressing   Any transportation issues/concerns?: no   Any patient concerns? no   Confirmed importance and date/time of follow-up visits scheduled yes  Provider Appointment booked with Appointment scheduled with Webb Silversmith 11/15/16 @ 3:45   Confirmed with patient if condition begins to worsen call PCP or go to the ER.  Patient was given the office number and encouraged to call back with question or concerns.  : yes

## 2016-11-15 ENCOUNTER — Ambulatory Visit (INDEPENDENT_AMBULATORY_CARE_PROVIDER_SITE_OTHER): Payer: Medicare Other | Admitting: Internal Medicine

## 2016-11-15 ENCOUNTER — Ambulatory Visit: Payer: Self-pay

## 2016-11-15 ENCOUNTER — Encounter: Payer: Self-pay | Admitting: Internal Medicine

## 2016-11-15 VITALS — BP 124/70 | HR 66 | Temp 97.7°F | Wt 233.0 lb

## 2016-11-15 DIAGNOSIS — I6200 Nontraumatic subdural hemorrhage, unspecified: Secondary | ICD-10-CM

## 2016-11-15 DIAGNOSIS — S065X9D Traumatic subdural hemorrhage with loss of consciousness of unspecified duration, subsequent encounter: Secondary | ICD-10-CM | POA: Diagnosis not present

## 2016-11-15 DIAGNOSIS — S51011D Laceration without foreign body of right elbow, subsequent encounter: Secondary | ICD-10-CM | POA: Diagnosis not present

## 2016-11-15 DIAGNOSIS — Y92099 Unspecified place in other non-institutional residence as the place of occurrence of the external cause: Secondary | ICD-10-CM | POA: Diagnosis not present

## 2016-11-15 DIAGNOSIS — E1122 Type 2 diabetes mellitus with diabetic chronic kidney disease: Secondary | ICD-10-CM | POA: Diagnosis not present

## 2016-11-15 DIAGNOSIS — S065XAA Traumatic subdural hemorrhage with loss of consciousness status unknown, initial encounter: Secondary | ICD-10-CM

## 2016-11-15 DIAGNOSIS — S065X9A Traumatic subdural hemorrhage with loss of consciousness of unspecified duration, initial encounter: Secondary | ICD-10-CM

## 2016-11-15 DIAGNOSIS — I25110 Atherosclerotic heart disease of native coronary artery with unstable angina pectoris: Secondary | ICD-10-CM | POA: Diagnosis not present

## 2016-11-15 DIAGNOSIS — E1140 Type 2 diabetes mellitus with diabetic neuropathy, unspecified: Secondary | ICD-10-CM | POA: Diagnosis not present

## 2016-11-15 DIAGNOSIS — W19XXXD Unspecified fall, subsequent encounter: Secondary | ICD-10-CM | POA: Diagnosis not present

## 2016-11-15 DIAGNOSIS — Y92009 Unspecified place in unspecified non-institutional (private) residence as the place of occurrence of the external cause: Principal | ICD-10-CM

## 2016-11-15 DIAGNOSIS — N183 Chronic kidney disease, stage 3 (moderate): Secondary | ICD-10-CM | POA: Diagnosis not present

## 2016-11-15 DIAGNOSIS — I110 Hypertensive heart disease with heart failure: Secondary | ICD-10-CM | POA: Diagnosis not present
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
## 2016-11-16 ENCOUNTER — Encounter: Payer: Self-pay | Admitting: Internal Medicine

## 2016-11-16 ENCOUNTER — Encounter (INDEPENDENT_AMBULATORY_CARE_PROVIDER_SITE_OTHER): Payer: Self-pay

## 2016-11-16 ENCOUNTER — Encounter: Payer: Self-pay | Admitting: Cardiovascular Disease

## 2016-11-16 ENCOUNTER — Ambulatory Visit (INDEPENDENT_AMBULATORY_CARE_PROVIDER_SITE_OTHER): Payer: Medicare Other | Admitting: Cardiovascular Disease

## 2016-11-16 VITALS — BP 142/90 | HR 76 | Ht 72.0 in | Wt 231.8 lb

## 2016-11-16 DIAGNOSIS — E7800 Pure hypercholesterolemia, unspecified: Secondary | ICD-10-CM

## 2016-11-16 DIAGNOSIS — I2510 Atherosclerotic heart disease of native coronary artery without angina pectoris: Secondary | ICD-10-CM

## 2016-11-16 DIAGNOSIS — I442 Atrioventricular block, complete: Secondary | ICD-10-CM

## 2016-11-16 DIAGNOSIS — I10 Essential (primary) hypertension: Secondary | ICD-10-CM | POA: Diagnosis not present

## 2016-11-16 NOTE — Patient Instructions (Signed)
Subdural Hematoma A subdural hematoma is a collection of blood between the brain and its tough outermost membrane covering (the dura). Blood clots that form in this area push down on the brain and cause irritation. A subdural hematoma may cause parts of the brain to stop working and eventually cause death.  CAUSES A subdural hematoma is caused by bleeding from a ruptured blood vessel (hemorrhage). The bleeding results from trauma to the head, such as from a fall or motor vehicle accident. There are two types of subdural hemorrhages:  Acute. This type develops shortly after a serious blow to the head and causes blood to collect very quickly. If not diagnosed and treated promptly, severe brain injury or death can occur.  Chronic. This is when bleeding develops more slowly, over weeks or months. RISK FACTORS People at risk for subdural hematoma include older persons, infants, and alcoholics. SYMPTOMS An acute subdural hemorrhage develops over minutes to hours. Symptoms can include:  Temporary loss of consciousness.  Weakness of arms or legs on one side of the body.  Changes in vision or speech.  A severe headache.  Seizures.  Nausea and vomiting.  Increased sleepiness. A chronic subdural hemorrhage develops over weeks to months. Symptoms may develop slowly and produce less noticeable problems or changes. Symptoms include:  A mild headache.  A change in personality.  Loss of balance or difficulty walking.  Weakness, numbness, or tingling in the arms or legs.  Nausea or vomiting.  Memory loss.  Double vision.  Increased sleepiness. DIAGNOSIS Your health care provider will perform a thorough physical and neurological exam. A CT scan or MRI may also be done. If there is blood on the scan, its color will help your health care provider determine how long the hemorrhage has been there. TREATMENT If the cause is an acute subdural hemorrhage, immediate treatment is needed. In many  cases an emergency surgery is performed to drain accumulated blood or to remove the blood clot. Sometimes steroid or diuretic medicines or controlled breathing through a ventilator is needed to decrease pressure in the brain. This is especially true if there is any swelling of the brain. If the cause is a chronic subdural hemorrhage, treatment depends on a variety of factors. Sometimes no treatment is needed. If the subdural hematoma is small and causes minimal or no symptoms, you may be treated with bed rest, medicines, and observation. If the hemorrhage is large or if you have neurological symptoms, an emergency surgery is usually needed to remove the blood clot. People who develop a subdural hemorrhage are at risk of developing seizures, even after the subdural hematoma has been treated. You may be prescribed an anti-seizure (anticonvulsant) medicine for a year or longer. HOME CARE INSTRUCTIONS  Only take medicines as directed by your health care provider.  Rest if directed by your health care provider.  Keep all follow-up appointments with your health care provider.  If you play a contact sport such as football, hockey or soccer and you experienced a significant head injury, allow enough time for healing (up to 15 days) before you start playing again. A repeated injury that occurs during this fragile repair period is likely to result in hemorrhage. This is called the second impact syndrome. SEEK IMMEDIATE MEDICAL CARE IF:  You fall or experience minor trauma to your head and you are taking blood thinners. If you are on any blood thinners even a very small injury can cause a subdural hematoma. You should not hesitate to seek  medical attention regardless of how minor you think your symptoms are.  You experience a head injury and have:  Drowsiness or a decrease in alertness.  Confusion or forgetfulness.  Slurred speech.  Irrational or aggressive behavior.  Numbness or paralysis in any part  of the body.  A feeling of being sick to your stomach (nauseous) or you throw up (vomit).  Difficulty walking or poor coordination.  Double vision.  Seizures.  A bleeding disorder.  A history of heavy alcohol use.  Clear fluid draining from your nose or ears.  Personality changes.  Difficulty thinking.  Worsening symptoms. MAKE SURE YOU:  Understand these instructions.  Will watch your condition.  Will get help right away if you are not doing well or get worse. [place] of Neurological Disorders and Stroke: MasterBoxes.it American Association of Neurological Surgeons: www.neurosurgerytoday.org American Academy of Neurology (AAN): http://keith.biz/ Brain Injury Association of America: www.biausa.org This information is not intended to replace advice given to you by your health care provider. Make sure you discuss any questions you have with your health care provider. Document Released: 10/01/2004 Document Revised: 09/04/2013 Document Reviewed: 05/17/2013 Elsevier Interactive Patient Education  2017 [employer].
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
## 2016-11-16 NOTE — Patient Instructions (Signed)
Medication Instructions:  Your physician recommends that you continue on your current medications as directed. Please refer to the Current Medication list given to you today.   Labwork: none  Testing/Procedures: none  Follow-Up: Your physician recommends that you schedule a follow-up appointment in: 6 months.  Please call our office in about 3 months to schedule this appointment.     Any Other Special Instructions Will Be Listed Below (If Applicable).     If you need a refill on your cardiac medications before your next appointment, please call your pharmacy.

## 2016-11-17 ENCOUNTER — Telehealth: Payer: Self-pay

## 2016-11-17 DIAGNOSIS — S065X9D Traumatic subdural hemorrhage with loss of consciousness of unspecified duration, subsequent encounter: Secondary | ICD-10-CM | POA: Diagnosis not present

## 2016-11-17 DIAGNOSIS — E1140 Type 2 diabetes mellitus with diabetic neuropathy, unspecified: Secondary | ICD-10-CM | POA: Diagnosis not present

## 2016-11-17 DIAGNOSIS — E1122 Type 2 diabetes mellitus with diabetic chronic kidney disease: Secondary | ICD-10-CM | POA: Diagnosis not present

## 2016-11-17 DIAGNOSIS — N183 Chronic kidney disease, stage 3 (moderate): Secondary | ICD-10-CM | POA: Diagnosis not present

## 2016-11-17 DIAGNOSIS — I25110 Atherosclerotic heart disease of native coronary artery with unstable angina pectoris: Secondary | ICD-10-CM | POA: Diagnosis not present

## 2016-11-17 DIAGNOSIS — I110 Hypertensive heart disease with heart failure: Secondary | ICD-10-CM | POA: Diagnosis not present

## 2016-11-17 NOTE — Telephone Encounter (Signed)
Ok to do. Thanks.

## 2016-11-17 NOTE — Telephone Encounter (Signed)
Erin PT with [facility] left v/m requesting verbal orders for [facility] PT 1 x a week for one week and 2 x a week for 4 weeks for balance, gait training and fall prevention.

## 2016-11-18 NOTE — Telephone Encounter (Signed)
Left detailed message.

## 2016-11-22 DIAGNOSIS — I110 Hypertensive heart disease with heart failure: Secondary | ICD-10-CM | POA: Diagnosis not present

## 2016-11-22 DIAGNOSIS — S065X9D Traumatic subdural hemorrhage with loss of consciousness of unspecified duration, subsequent encounter: Secondary | ICD-10-CM | POA: Diagnosis not present

## 2016-11-22 DIAGNOSIS — I25110 Atherosclerotic heart disease of native coronary artery with unstable angina pectoris: Secondary | ICD-10-CM | POA: Diagnosis not present

## 2016-11-22 DIAGNOSIS — N183 Chronic kidney disease, stage 3 (moderate): Secondary | ICD-10-CM | POA: Diagnosis not present

## 2016-11-22 DIAGNOSIS — E1140 Type 2 diabetes mellitus with diabetic neuropathy, unspecified: Secondary | ICD-10-CM | POA: Diagnosis not present

## 2016-11-22 DIAGNOSIS — E1122 Type 2 diabetes mellitus with diabetic chronic kidney disease: Secondary | ICD-10-CM | POA: Diagnosis not present

## 2016-11-23 DIAGNOSIS — I110 Hypertensive heart disease with heart failure: Secondary | ICD-10-CM | POA: Diagnosis not present

## 2016-11-23 DIAGNOSIS — I25110 Atherosclerotic heart disease of native coronary artery with unstable angina pectoris: Secondary | ICD-10-CM | POA: Diagnosis not present

## 2016-11-23 DIAGNOSIS — E1140 Type 2 diabetes mellitus with diabetic neuropathy, unspecified: Secondary | ICD-10-CM | POA: Diagnosis not present

## 2016-11-23 DIAGNOSIS — S065X9D Traumatic subdural hemorrhage with loss of consciousness of unspecified duration, subsequent encounter: Secondary | ICD-10-CM | POA: Diagnosis not present

## 2016-11-23 DIAGNOSIS — E1122 Type 2 diabetes mellitus with diabetic chronic kidney disease: Secondary | ICD-10-CM | POA: Diagnosis not present

## 2016-11-23 DIAGNOSIS — N183 Chronic kidney disease, stage 3 (moderate): Secondary | ICD-10-CM | POA: Diagnosis not present

## 2016-11-25 DIAGNOSIS — I110 Hypertensive heart disease with heart failure: Secondary | ICD-10-CM | POA: Diagnosis not present

## 2016-11-25 DIAGNOSIS — N183 Chronic kidney disease, stage 3 (moderate): Secondary | ICD-10-CM | POA: Diagnosis not present

## 2016-11-25 DIAGNOSIS — S065X9D Traumatic subdural hemorrhage with loss of consciousness of unspecified duration, subsequent encounter: Secondary | ICD-10-CM | POA: Diagnosis not present

## 2016-11-25 DIAGNOSIS — E1122 Type 2 diabetes mellitus with diabetic chronic kidney disease: Secondary | ICD-10-CM | POA: Diagnosis not present

## 2016-11-25 DIAGNOSIS — I25110 Atherosclerotic heart disease of native coronary artery with unstable angina pectoris: Secondary | ICD-10-CM | POA: Diagnosis not present

## 2016-11-25 DIAGNOSIS — E1140 Type 2 diabetes mellitus with diabetic neuropathy, unspecified: Secondary | ICD-10-CM | POA: Diagnosis not present

## 2016-11-29 DIAGNOSIS — N183 Chronic kidney disease, stage 3 (moderate): Secondary | ICD-10-CM | POA: Diagnosis not present

## 2016-11-29 DIAGNOSIS — I25110 Atherosclerotic heart disease of native coronary artery with unstable angina pectoris: Secondary | ICD-10-CM | POA: Diagnosis not present

## 2016-11-29 DIAGNOSIS — S065X9D Traumatic subdural hemorrhage with loss of consciousness of unspecified duration, subsequent encounter: Secondary | ICD-10-CM | POA: Diagnosis not present

## 2016-11-29 DIAGNOSIS — E1122 Type 2 diabetes mellitus with diabetic chronic kidney disease: Secondary | ICD-10-CM | POA: Diagnosis not present

## 2016-11-29 DIAGNOSIS — I110 Hypertensive heart disease with heart failure: Secondary | ICD-10-CM | POA: Diagnosis not present

## 2016-11-29 DIAGNOSIS — E1140 Type 2 diabetes mellitus with diabetic neuropathy, unspecified: Secondary | ICD-10-CM | POA: Diagnosis not present

## 2016-12-01 DIAGNOSIS — I25110 Atherosclerotic heart disease of native coronary artery with unstable angina pectoris: Secondary | ICD-10-CM | POA: Diagnosis not present

## 2016-12-01 DIAGNOSIS — E1122 Type 2 diabetes mellitus with diabetic chronic kidney disease: Secondary | ICD-10-CM | POA: Diagnosis not present

## 2016-12-01 DIAGNOSIS — I110 Hypertensive heart disease with heart failure: Secondary | ICD-10-CM | POA: Diagnosis not present

## 2016-12-01 DIAGNOSIS — N183 Chronic kidney disease, stage 3 (moderate): Secondary | ICD-10-CM | POA: Diagnosis not present

## 2016-12-01 DIAGNOSIS — S065X9D Traumatic subdural hemorrhage with loss of consciousness of unspecified duration, subsequent encounter: Secondary | ICD-10-CM | POA: Diagnosis not present

## 2016-12-01 DIAGNOSIS — E1140 Type 2 diabetes mellitus with diabetic neuropathy, unspecified: Secondary | ICD-10-CM | POA: Diagnosis not present

## 2016-12-06 DIAGNOSIS — I25110 Atherosclerotic heart disease of native coronary artery with unstable angina pectoris: Secondary | ICD-10-CM | POA: Diagnosis not present

## 2016-12-06 DIAGNOSIS — N183 Chronic kidney disease, stage 3 (moderate): Secondary | ICD-10-CM | POA: Diagnosis not present

## 2016-12-06 DIAGNOSIS — E1122 Type 2 diabetes mellitus with diabetic chronic kidney disease: Secondary | ICD-10-CM | POA: Diagnosis not present

## 2016-12-06 DIAGNOSIS — E1140 Type 2 diabetes mellitus with diabetic neuropathy, unspecified: Secondary | ICD-10-CM | POA: Diagnosis not present

## 2016-12-06 DIAGNOSIS — I110 Hypertensive heart disease with heart failure: Secondary | ICD-10-CM | POA: Diagnosis not present

## 2016-12-06 DIAGNOSIS — S065X9D Traumatic subdural hemorrhage with loss of consciousness of unspecified duration, subsequent encounter: Secondary | ICD-10-CM | POA: Diagnosis not present

## 2016-12-08 DIAGNOSIS — E1140 Type 2 diabetes mellitus with diabetic neuropathy, unspecified: Secondary | ICD-10-CM | POA: Diagnosis not present

## 2016-12-08 DIAGNOSIS — I25110 Atherosclerotic heart disease of native coronary artery with unstable angina pectoris: Secondary | ICD-10-CM | POA: Diagnosis not present

## 2016-12-08 DIAGNOSIS — S065X9D Traumatic subdural hemorrhage with loss of consciousness of unspecified duration, subsequent encounter: Secondary | ICD-10-CM | POA: Diagnosis not present

## 2016-12-08 DIAGNOSIS — E1122 Type 2 diabetes mellitus with diabetic chronic kidney disease: Secondary | ICD-10-CM | POA: Diagnosis not present

## 2016-12-08 DIAGNOSIS — N183 Chronic kidney disease, stage 3 (moderate): Secondary | ICD-10-CM | POA: Diagnosis not present

## 2016-12-08 DIAGNOSIS — I110 Hypertensive heart disease with heart failure: Secondary | ICD-10-CM | POA: Diagnosis not present

## 2016-12-13 ENCOUNTER — Encounter (HOSPITAL_COMMUNITY): Payer: Self-pay | Admitting: Emergency Medicine

## 2016-12-13 ENCOUNTER — Emergency Department (HOSPITAL_COMMUNITY): Payer: Medicare Other

## 2016-12-13 ENCOUNTER — Emergency Department (HOSPITAL_COMMUNITY)
Admission: EM | Admit: 2016-12-13 | Discharge: 2016-12-13 | Disposition: A | Discharge status: Home / Self Care | Payer: Medicare Other | Attending: Emergency Medicine | Admitting: Emergency Medicine

## 2016-12-13 DIAGNOSIS — I5023 Acute on chronic systolic (congestive) heart failure: Secondary | ICD-10-CM | POA: Insufficient documentation

## 2016-12-13 DIAGNOSIS — T148XXA Other injury of unspecified body region, initial encounter: Secondary | ICD-10-CM | POA: Diagnosis not present

## 2016-12-13 DIAGNOSIS — M7989 Other specified soft tissue disorders: Secondary | ICD-10-CM | POA: Diagnosis not present

## 2016-12-13 DIAGNOSIS — Z96651 Presence of right artificial knee joint: Secondary | ICD-10-CM | POA: Diagnosis not present

## 2016-12-13 DIAGNOSIS — S0990XA Unspecified injury of head, initial encounter: Secondary | ICD-10-CM | POA: Diagnosis not present

## 2016-12-13 DIAGNOSIS — Y999 Unspecified external cause status: Secondary | ICD-10-CM | POA: Diagnosis not present

## 2016-12-13 DIAGNOSIS — Z794 Long term (current) use of insulin: Secondary | ICD-10-CM | POA: Diagnosis not present

## 2016-12-13 DIAGNOSIS — S8001XA Contusion of right knee, initial encounter: Secondary | ICD-10-CM | POA: Insufficient documentation

## 2016-12-13 DIAGNOSIS — N183 Chronic kidney disease, stage 3 (moderate): Secondary | ICD-10-CM | POA: Insufficient documentation

## 2016-12-13 DIAGNOSIS — I2510 Atherosclerotic heart disease of native coronary artery without angina pectoris: Secondary | ICD-10-CM | POA: Insufficient documentation

## 2016-12-13 DIAGNOSIS — E1140 Type 2 diabetes mellitus with diabetic neuropathy, unspecified: Secondary | ICD-10-CM | POA: Diagnosis not present

## 2016-12-13 DIAGNOSIS — S59901A Unspecified injury of right elbow, initial encounter: Secondary | ICD-10-CM | POA: Diagnosis not present

## 2016-12-13 DIAGNOSIS — M25521 Pain in right elbow: Secondary | ICD-10-CM | POA: Diagnosis not present

## 2016-12-13 DIAGNOSIS — Z8673 Personal history of transient ischemic attack (TIA), and cerebral infarction without residual deficits: Secondary | ICD-10-CM | POA: Diagnosis not present

## 2016-12-13 DIAGNOSIS — R51 Headache: Secondary | ICD-10-CM | POA: Diagnosis not present

## 2016-12-13 DIAGNOSIS — Y939 Activity, unspecified: Secondary | ICD-10-CM | POA: Insufficient documentation

## 2016-12-13 DIAGNOSIS — Z955 Presence of coronary angioplasty implant and graft: Secondary | ICD-10-CM | POA: Insufficient documentation

## 2016-12-13 DIAGNOSIS — R55 Syncope and collapse: Secondary | ICD-10-CM | POA: Diagnosis not present

## 2016-12-13 DIAGNOSIS — M25561 Pain in right knee: Secondary | ICD-10-CM | POA: Diagnosis not present

## 2016-12-13 DIAGNOSIS — Y92009 Unspecified place in unspecified non-institutional (private) residence as the place of occurrence of the external cause: Secondary | ICD-10-CM | POA: Diagnosis not present

## 2016-12-13 DIAGNOSIS — Z950 Presence of cardiac pacemaker: Secondary | ICD-10-CM | POA: Insufficient documentation

## 2016-12-13 DIAGNOSIS — Z79899 Other long term (current) drug therapy: Secondary | ICD-10-CM | POA: Diagnosis not present

## 2016-12-13 DIAGNOSIS — S8991XA Unspecified injury of right lower leg, initial encounter: Secondary | ICD-10-CM | POA: Diagnosis not present

## 2016-12-13 DIAGNOSIS — W228XXA Striking against or struck by other objects, initial encounter: Secondary | ICD-10-CM | POA: Diagnosis not present

## 2016-12-13 DIAGNOSIS — I130 Hypertensive heart and chronic kidney disease with heart failure and stage 1 through stage 4 chronic kidney disease, or unspecified chronic kidney disease: Secondary | ICD-10-CM | POA: Insufficient documentation

## 2016-12-13 DIAGNOSIS — E11319 Type 2 diabetes mellitus with unspecified diabetic retinopathy without macular edema: Secondary | ICD-10-CM | POA: Insufficient documentation

## 2016-12-13 DIAGNOSIS — S0083XA Contusion of other part of head, initial encounter: Secondary | ICD-10-CM | POA: Diagnosis not present

## 2016-12-13 DIAGNOSIS — S0993XA Unspecified injury of face, initial encounter: Secondary | ICD-10-CM | POA: Diagnosis not present

## 2016-12-13 DIAGNOSIS — I252 Old myocardial infarction: Secondary | ICD-10-CM | POA: Insufficient documentation

## 2016-12-13 DIAGNOSIS — W19XXXA Unspecified fall, initial encounter: Secondary | ICD-10-CM

## 2016-12-13 LAB — CBC
HCT: 47.9 % (ref 39.0–52.0)
HEMOGLOBIN: 16.4 g/dL (ref 13.0–17.0)
MCH: 30.8 pg (ref 26.0–34.0)
MCHC: 34.2 g/dL (ref 30.0–36.0)
MCV: 90 fL (ref 78.0–100.0)
Platelets: 159 10*3/uL (ref 150–400)
RBC: 5.32 MIL/uL (ref 4.22–5.81)
RDW: 14 % (ref 11.5–15.5)
WBC: 12 10*3/uL — ABNORMAL HIGH (ref 4.0–10.5)

## 2016-12-13 LAB — BASIC METABOLIC PANEL
ANION GAP: 14 (ref 5–15)
BUN: 19 mg/dL (ref 6–20)
CHLORIDE: 105 mmol/L (ref 101–111)
CO2: 20 mmol/L — AB (ref 22–32)
Calcium: 8.8 mg/dL — ABNORMAL LOW (ref 8.9–10.3)
Creatinine, Ser: 1.63 mg/dL — ABNORMAL HIGH (ref 0.61–1.24)
GFR calc non Af Amer: 37 mL/min — ABNORMAL LOW (ref >60)
GFR, EST AFRICAN AMERICAN: 43 mL/min — AB (ref >60)
Glucose, Bld: 170 mg/dL — ABNORMAL HIGH (ref 65–99)
Potassium: 4.4 mmol/L (ref 3.5–5.1)
Sodium: 139 mmol/L (ref 135–145)

## 2016-12-13 LAB — I-STAT TROPONIN, ED: Troponin i, poc: 0.03 ng/mL (ref 0.00–0.08)

## 2016-12-13 NOTE — ED Notes (Signed)
ED Provider at bedside.
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
## 2016-12-13 NOTE — ED Notes (Signed)
Pt returned from CT placed on monitor

## 2016-12-13 NOTE — ED Notes (Signed)
Pt and wife comfortable with discharge and follow up instructions. Rx x0

## 2016-12-13 NOTE — Discharge Planning (Signed)
Pt currently active with [facility] for PT services.  Resumption of care requested. Christa See, RN of [facility] notified.  No DME needs identified at this time.

## 2016-12-13 NOTE — ED Notes (Signed)
  Pt transported to ct

## 2016-12-13 NOTE — ED Triage Notes (Signed)
Per EMS, pt from home with c/o fall. Pt recently fell on Saturday (1/13), resulting in right eye scratch and swelling. Per family, right knee swelling noted after fall this am. En route in ambulance, EMS noted that pt's pacemaker would stop pacing and restart. Pt has hx of dementia, at baseline. BP-194/110

## 2016-12-13 NOTE — Discharge Instructions (Signed)
Return to the ED with any concerns including vomiting, decreased level of alertness/seizure activity, increased pain or swelling of right knee, or any other alarming symptoms

## 2016-12-13 NOTE — ED Notes (Signed)
St jude rep. At bedside. Awaiting pts return from radiology

## 2016-12-16 ENCOUNTER — Encounter: Payer: Self-pay | Admitting: Family Medicine

## 2016-12-16 ENCOUNTER — Ambulatory Visit (INDEPENDENT_AMBULATORY_CARE_PROVIDER_SITE_OTHER): Payer: Medicare Other | Admitting: Family Medicine

## 2016-12-16 VITALS — BP 138/88 | HR 77 | Temp 97.4°F | Wt 235.0 lb

## 2016-12-16 DIAGNOSIS — S065X9A Traumatic subdural hemorrhage with loss of consciousness of unspecified duration, initial encounter: Secondary | ICD-10-CM

## 2016-12-16 DIAGNOSIS — R269 Unspecified abnormalities of gait and mobility: Secondary | ICD-10-CM | POA: Diagnosis not present

## 2016-12-16 DIAGNOSIS — I6200 Nontraumatic subdural hemorrhage, unspecified: Secondary | ICD-10-CM | POA: Diagnosis not present

## 2016-12-16 DIAGNOSIS — R296 Repeated falls: Secondary | ICD-10-CM | POA: Diagnosis not present

## 2016-12-16 DIAGNOSIS — S8991XD Unspecified injury of right lower leg, subsequent encounter: Secondary | ICD-10-CM | POA: Diagnosis not present

## 2016-12-16 DIAGNOSIS — S065XAA Traumatic subdural hemorrhage with loss of consciousness status unknown, initial encounter: Secondary | ICD-10-CM

## 2016-12-16 NOTE — Patient Instructions (Signed)
I recommend 24 hour supervision at home to help prevent falls.  Elevate right leg as much as able. Continue knee sleeve, ice to knee for 15-20 min at a time  Return in 4-6 weeks for follow up visit.

## 2016-12-16 NOTE — Assessment & Plan Note (Signed)
Rpt CT stable. No signs of worsening bleed.

## 2016-12-16 NOTE — Progress Notes (Signed)
Pre visit review using our clinic review tool, if applicable. No additional management support is needed unless otherwise documented below in the visit note.

## 2016-12-16 NOTE — Progress Notes (Signed)
BP 138/88 (BP Location: Left Arm, Patient Position: Sitting, Cuff Size: Large)   Pulse 77   Temp 97.4 F (36.3 C) (Oral)   Wt 235 lb (106.6 kg)   SpO2 96%   BMI 31.87 kg/m    CC: ER f/u visit Subjective:    Patient ID: Frank Simpson, male    DOB: [birth year], 83 y.o.   MRN: [id number]  HPI: Frank Simpson is a 83 y.o. male presenting on 12/16/2016 for ER Follow-up (Fall)   Recent prior hospitalization for SDH after fall at home 10/2016. Aspirin was held. Underwent HH PT/OT with [facility].   Patient had another 2 falls at home latest 12/10/2016, evaluated at ER (records reviewed) - concern for pacemaker malfunction - s/p normal interrogation by [employer]. R elbow xray and R knee xray without acute findings other than prepatellar soft tissue swelling/hematoma. Head CT with persistent falcine subdural hematoma, R facial soft tissue swelling, TMJ arthropathy bialterally and paranasal sinus disease. Given knee sleeve.   Bruised L thenar thumb, R black eye and lateral R eyebrow contusion, R knee and R elbow. All recent falls have been mechanical falls tripping.   Recurrent falls despite [facility] PT/OT.  Denies fevers/chills. Ongoing headache at area where he fell but not worse. Increased confusion noted recently with unclear cause. No voiding changes. Recent CT without obvious acute stroke or worsened bleed.   Relevant past medical, surgical, family and social history reviewed and updated as indicated. Interim medical history since our last visit reviewed. Allergies and medications reviewed and updated. Current Outpatient Prescriptions on File Prior to Visit  Medication Sig  . acetaminophen (TYLENOL) 500 MG tablet Take 500 mg by mouth every 6 (six) hours as needed (pain).   [name] Kitchen amLODipine (NORVASC) 5 MG tablet Take 1 tablet (5 mg total) by mouth daily.  [name] Kitchen HUMALOG KWIKPEN 100 UNIT/ML KiwkPen INJECT 4 UNITS WITH        BREAKFAST, 8 UNITS WITH    LUNCH/DINNER PLUS SLIDING  SCALE  (NEW DIRECTIONS)  . Insulin Glargine (BASAGLAR KWIKPEN) 100 UNIT/ML SOPN Inject 0.5 mLs (50 Units total) into the skin daily.  . isosorbide mononitrate (IMDUR) 30 MG 24 hr tablet Take 1 tablet (30 mg total) by mouth daily.  . Naphazoline HCl (CLEAR EYES OP) Place 1 drop into both eyes 3 (three) times daily.  . rosuvastatin (CRESTOR) 20 MG tablet Take 1 tablet (20 mg total) by mouth daily.  . sertraline (ZOLOFT) 25 MG tablet Take 0.5 tablets (25 mg total) by mouth daily.  . nitroGLYCERIN (NITROSTAT) 0.4 MG SL tablet Place 1 tablet (0.4 mg total) under the tongue every 5 (five) minutes as needed for chest pain. Up to three doses,if pain continues call 911   No current facility-administered medications on file prior to visit.     Review of Systems Per HPI unless specifically indicated in ROS section     Objective:    BP 138/88 (BP Location: Left Arm, Patient Position: Sitting, Cuff Size: Large)   Pulse 77   Temp 97.4 F (36.3 C) (Oral)   Wt 235 lb (106.6 kg)   SpO2 96%   BMI 31.87 kg/m   Wt Readings from Last 3 Encounters:  12/16/16 235 lb (106.6 kg)  11/16/16 231 lb 12.8 oz (105.1 kg)  11/15/16 233 lb (105.7 kg)    Physical Exam  Constitutional: He appears well-developed and well-nourished. No distress.  HENT:  Head: Normocephalic and atraumatic.  Mouth/Throat: Oropharynx is clear  and moist. No oropharyngeal exudate.  Eyes: Conjunctivae and EOM are normal. Pupils are equal, round, and reactive to light.  Neck: Normal range of motion. Neck supple.  Cardiovascular: Normal rate, regular rhythm, normal heart sounds and intact distal pulses.   No murmur heard. Pulmonary/Chest: Effort normal and breath sounds normal. No respiratory distress. He has no wheezes. He has no rales.  Musculoskeletal: He exhibits no edema.  No goose egg of scalp noted L calf circ 38cm R calf circ 41cm No significant tenderness posterior leg or palpable cords  + tender and bruising of R anterior knee    Lymphadenopathy:    He has no cervical adenopathy.  Skin: Skin is warm and dry. No rash noted.  Bruising of right anterior knee  Psychiatric: He has a normal mood and affect.  Nursing note and vitals reviewed.  Results for orders placed or performed during the hospital encounter of 12/13/16  CBC  Result Value Ref Range   WBC 12.0 (H) 4.0 - 10.5 K/uL   RBC 5.32 4.22 - 5.81 MIL/uL   Hemoglobin 16.4 13.0 - 17.0 g/dL   HCT 47.9 39.0 - 52.0 %   MCV 90.0 78.0 - 100.0 fL   MCH 30.8 26.0 - 34.0 pg   MCHC 34.2 30.0 - 36.0 g/dL   RDW 14.0 11.5 - 15.5 %   Platelets 159 150 - 400 K/uL  Basic metabolic panel  Result Value Ref Range   Sodium 139 135 - 145 mmol/L   Potassium 4.4 3.5 - 5.1 mmol/L   Chloride 105 101 - 111 mmol/L   CO2 20 (L) 22 - 32 mmol/L   Glucose, Bld 170 (H) 65 - 99 mg/dL   BUN 19 6 - 20 mg/dL   Creatinine, Ser 1.63 (H) 0.61 - 1.24 mg/dL   Calcium 8.8 (L) 8.9 - 10.3 mg/dL   GFR calc non Af Amer 37 (L) >60 mL/min   GFR calc Af Amer 43 (L) >60 mL/min   Anion gap 14 5 - 15  I-stat troponin, ED  Result Value Ref Range   Troponin i, poc 0.03 0.00 - 0.08 ng/mL   Comment 3           Lab Results  Component Value Date   HGBA1C 7.2 (H) 05/09/2016       Assessment & Plan:   Problem List Items Addressed This Visit    Frequent falls - Primary    Ongoing high risk with recurrent falls due to imbalance despite regular walker use. Both falls recently were unsupervised - rec 24 hour supervision at home, wife has started sleeping in same room as patient. Discussed respite care for wife but also importance of close monitoring of patient at all times. Continue HHPT/OT. Discussed possible need for higher level of care, wife feels she can manage at home without need of personal care nurse aide.       Gait disorder   Right knee injury, subsequent encounter    RLE swelling noted today >left, but anticipate due to soft tissue swelling after knee injury with latest fall, doubt DVT - if  persistent, consider venous US for further evaluation. rec tylenol, elevation of leg, knee sleeve, and icing of knee.  H/o R knee surgery.       SDH (subdural hematoma) (HCC)    Rpt CT stable. No signs of worsening bleed.           Follow up plan: Return in about 6 weeks (around 01/27/2017) for follow up  visit.  [name], MD

## 2016-12-16 NOTE — Assessment & Plan Note (Signed)
RLE swelling noted today >left, but anticipate due to soft tissue swelling after knee injury with latest fall, doubt DVT - if persistent, consider venous US for further evaluation. rec tylenol, elevation of leg, knee sleeve, and icing of knee.  H/o R knee surgery.

## 2016-12-16 NOTE — Assessment & Plan Note (Signed)
Ongoing high risk with recurrent falls due to imbalance despite regular walker use. Both falls recently were unsupervised - rec 24 hour supervision at home, wife has started sleeping in same room as patient. Discussed respite care for wife but also importance of close monitoring of patient at all times. Continue HHPT/OT. Discussed possible need for higher level of care, wife feels she can manage at home without need of personal care nurse aide.

## 2016-12-19 ENCOUNTER — Telehealth: Payer: Self-pay

## 2016-12-19 DIAGNOSIS — E1122 Type 2 diabetes mellitus with diabetic chronic kidney disease: Secondary | ICD-10-CM | POA: Diagnosis not present

## 2016-12-19 DIAGNOSIS — E1140 Type 2 diabetes mellitus with diabetic neuropathy, unspecified: Secondary | ICD-10-CM | POA: Diagnosis not present

## 2016-12-19 DIAGNOSIS — N183 Chronic kidney disease, stage 3 (moderate): Secondary | ICD-10-CM | POA: Diagnosis not present

## 2016-12-19 DIAGNOSIS — I25110 Atherosclerotic heart disease of native coronary artery with unstable angina pectoris: Secondary | ICD-10-CM | POA: Diagnosis not present

## 2016-12-19 DIAGNOSIS — I110 Hypertensive heart disease with heart failure: Secondary | ICD-10-CM | POA: Diagnosis not present

## 2016-12-19 DIAGNOSIS — S065X9D Traumatic subdural hemorrhage with loss of consciousness of unspecified duration, subsequent encounter: Secondary | ICD-10-CM | POA: Diagnosis not present

## 2016-12-19 NOTE — Telephone Encounter (Signed)
Ok to do this. Thanks.

## 2016-12-19 NOTE — Telephone Encounter (Signed)
Frank Simpson Pt with [facility] left v/m requesting verbal orders for [facility] PT to continue 2 x a week for 3 weeks for gait training and fall prevention.

## 2016-12-20 NOTE — Telephone Encounter (Signed)
Message left advising Erin.

## 2016-12-21 DIAGNOSIS — I110 Hypertensive heart disease with heart failure: Secondary | ICD-10-CM | POA: Diagnosis not present

## 2016-12-21 DIAGNOSIS — N183 Chronic kidney disease, stage 3 (moderate): Secondary | ICD-10-CM | POA: Diagnosis not present

## 2016-12-21 DIAGNOSIS — E1122 Type 2 diabetes mellitus with diabetic chronic kidney disease: Secondary | ICD-10-CM | POA: Diagnosis not present

## 2016-12-21 DIAGNOSIS — E1140 Type 2 diabetes mellitus with diabetic neuropathy, unspecified: Secondary | ICD-10-CM | POA: Diagnosis not present

## 2016-12-21 DIAGNOSIS — I25110 Atherosclerotic heart disease of native coronary artery with unstable angina pectoris: Secondary | ICD-10-CM | POA: Diagnosis not present

## 2016-12-21 DIAGNOSIS — S065X9D Traumatic subdural hemorrhage with loss of consciousness of unspecified duration, subsequent encounter: Secondary | ICD-10-CM | POA: Diagnosis not present

## 2016-12-27 DIAGNOSIS — E1140 Type 2 diabetes mellitus with diabetic neuropathy, unspecified: Secondary | ICD-10-CM | POA: Diagnosis not present

## 2016-12-27 DIAGNOSIS — S065X9D Traumatic subdural hemorrhage with loss of consciousness of unspecified duration, subsequent encounter: Secondary | ICD-10-CM | POA: Diagnosis not present

## 2016-12-27 DIAGNOSIS — N183 Chronic kidney disease, stage 3 (moderate): Secondary | ICD-10-CM | POA: Diagnosis not present

## 2016-12-27 DIAGNOSIS — I110 Hypertensive heart disease with heart failure: Secondary | ICD-10-CM | POA: Diagnosis not present

## 2016-12-27 DIAGNOSIS — I25110 Atherosclerotic heart disease of native coronary artery with unstable angina pectoris: Secondary | ICD-10-CM | POA: Diagnosis not present

## 2016-12-27 DIAGNOSIS — E1122 Type 2 diabetes mellitus with diabetic chronic kidney disease: Secondary | ICD-10-CM | POA: Diagnosis not present

## 2016-12-29 DIAGNOSIS — S065X9D Traumatic subdural hemorrhage with loss of consciousness of unspecified duration, subsequent encounter: Secondary | ICD-10-CM | POA: Diagnosis not present

## 2016-12-29 DIAGNOSIS — E1140 Type 2 diabetes mellitus with diabetic neuropathy, unspecified: Secondary | ICD-10-CM | POA: Diagnosis not present

## 2016-12-29 DIAGNOSIS — I25110 Atherosclerotic heart disease of native coronary artery with unstable angina pectoris: Secondary | ICD-10-CM | POA: Diagnosis not present

## 2016-12-29 DIAGNOSIS — E1122 Type 2 diabetes mellitus with diabetic chronic kidney disease: Secondary | ICD-10-CM | POA: Diagnosis not present

## 2016-12-29 DIAGNOSIS — I110 Hypertensive heart disease with heart failure: Secondary | ICD-10-CM | POA: Diagnosis not present

## 2016-12-29 DIAGNOSIS — N183 Chronic kidney disease, stage 3 (moderate): Secondary | ICD-10-CM | POA: Diagnosis not present

## 2017-01-02 ENCOUNTER — Other Ambulatory Visit: Payer: Self-pay | Admitting: Family Medicine

## 2017-01-03 DIAGNOSIS — N183 Chronic kidney disease, stage 3 (moderate): Secondary | ICD-10-CM | POA: Diagnosis not present

## 2017-01-03 DIAGNOSIS — S065X9D Traumatic subdural hemorrhage with loss of consciousness of unspecified duration, subsequent encounter: Secondary | ICD-10-CM | POA: Diagnosis not present

## 2017-01-03 DIAGNOSIS — E1122 Type 2 diabetes mellitus with diabetic chronic kidney disease: Secondary | ICD-10-CM | POA: Diagnosis not present

## 2017-01-03 DIAGNOSIS — I110 Hypertensive heart disease with heart failure: Secondary | ICD-10-CM | POA: Diagnosis not present

## 2017-01-03 DIAGNOSIS — E1140 Type 2 diabetes mellitus with diabetic neuropathy, unspecified: Secondary | ICD-10-CM | POA: Diagnosis not present

## 2017-01-03 DIAGNOSIS — I25110 Atherosclerotic heart disease of native coronary artery with unstable angina pectoris: Secondary | ICD-10-CM | POA: Diagnosis not present

## 2017-01-05 DIAGNOSIS — E1140 Type 2 diabetes mellitus with diabetic neuropathy, unspecified: Secondary | ICD-10-CM | POA: Diagnosis not present

## 2017-01-05 DIAGNOSIS — N183 Chronic kidney disease, stage 3 (moderate): Secondary | ICD-10-CM | POA: Diagnosis not present

## 2017-01-05 DIAGNOSIS — E1122 Type 2 diabetes mellitus with diabetic chronic kidney disease: Secondary | ICD-10-CM | POA: Diagnosis not present

## 2017-01-05 DIAGNOSIS — I110 Hypertensive heart disease with heart failure: Secondary | ICD-10-CM | POA: Diagnosis not present

## 2017-01-05 DIAGNOSIS — S065X9D Traumatic subdural hemorrhage with loss of consciousness of unspecified duration, subsequent encounter: Secondary | ICD-10-CM | POA: Diagnosis not present

## 2017-01-05 DIAGNOSIS — I25110 Atherosclerotic heart disease of native coronary artery with unstable angina pectoris: Secondary | ICD-10-CM | POA: Diagnosis not present

## 2017-01-12 ENCOUNTER — Telehealth: Payer: Self-pay | Admitting: Family Medicine

## 2017-01-12 NOTE — Telephone Encounter (Signed)
Pt has appt with Dr Diona Browner 01/13/17 at [place].

## 2017-01-12 NOTE — Telephone Encounter (Signed)
[facility] Patient Name: Frank Simpson DOB: 01-07-1931 Initial Comment caller states husband has a cough Nurse Assessment Nurse: Markus Daft, RN, Sherre Poot Date/Time (Eastern Time): 01/12/2017 10:51:23 AM Confirm and document reason for call. If symptomatic, describe symptoms. ---Caller states husband has a frequent cough with chest rattling. No CP or SOB. No fever or nasal discharge. Taking Coricidin for a week and not better. Does the patient have any new or worsening symptoms? ---Yes Will a triage be completed? ---Yes Related visit to physician within the last 2 weeks? ---No Does the PT have any chronic conditions? (i.e. diabetes, asthma, etc.) ---Yes List chronic conditions. ---DM, HOH, HTN Is this a behavioral health or substance abuse call? ---No Guidelines Guideline Title Affirmed Question Affirmed Notes Cough - Acute Non-Productive Wheezing is present Final Disposition User See Physician within 4 Hours (or PCP triage) Markus Daft, RN, Windy Comments RN did not find any openings for today at [facility] or [facility]. Pt having wheezing off/on, and not now. RN tried to encourage caller to go to UC. They would rather wait til tomorrow AM. Appt made with Dr. Diona Browner at 9 am. Referrals REFERRED TO PCP OFFICE Disagree/Comply: Comply

## 2017-01-13 ENCOUNTER — Ambulatory Visit (INDEPENDENT_AMBULATORY_CARE_PROVIDER_SITE_OTHER)
Admission: RE | Admit: 2017-01-13 | Discharge: 2017-01-13 | Disposition: A | Discharge status: Home / Self Care | Payer: Medicare Other | Source: Ambulatory Visit | Attending: Family Medicine | Admitting: Family Medicine

## 2017-01-13 ENCOUNTER — Encounter: Payer: Self-pay | Admitting: Family Medicine

## 2017-01-13 ENCOUNTER — Ambulatory Visit (INDEPENDENT_AMBULATORY_CARE_PROVIDER_SITE_OTHER): Payer: Medicare Other | Admitting: Family Medicine

## 2017-01-13 VITALS — BP 130/60 | HR 103 | Temp 97.8°F | Ht 72.0 in | Wt 237.0 lb

## 2017-01-13 DIAGNOSIS — R059 Cough, unspecified: Secondary | ICD-10-CM

## 2017-01-13 DIAGNOSIS — R05 Cough: Secondary | ICD-10-CM

## 2017-01-13 DIAGNOSIS — E113293 Type 2 diabetes mellitus with mild nonproliferative diabetic retinopathy without macular edema, bilateral: Secondary | ICD-10-CM

## 2017-01-13 DIAGNOSIS — I5022 Chronic systolic (congestive) heart failure: Secondary | ICD-10-CM | POA: Diagnosis not present

## 2017-01-13 DIAGNOSIS — Z794 Long term (current) use of insulin: Secondary | ICD-10-CM | POA: Diagnosis not present

## 2017-01-13 LAB — POC INFLUENZA A&B (BINAX/QUICKVUE)
INFLUENZA A, POC: NEGATIVE
Influenza B, POC: NEGATIVE

## 2017-01-13 MED ORDER — BENZONATATE 200 MG PO CAPS: 200.0000 mg | ORAL_CAPSULE | Freq: Two times a day (BID) | ORAL | 0 refills | Status: DC | PRN

## 2017-01-13 MED ORDER — DOXYCYCLINE HYCLATE 100 MG PO TABS: 100.0000 mg | ORAL_TABLET | Freq: Two times a day (BID) | ORAL | 0 refills | Status: DC

## 2017-01-13 NOTE — Progress Notes (Signed)
Pre visit review using our clinic review tool, if applicable. No additional management support is needed unless otherwise documented below in the visit note.

## 2017-01-13 NOTE — Assessment & Plan Note (Signed)
Pt at higher risk for serious infection given diabetes.

## 2017-01-13 NOTE — Patient Instructions (Signed)
Complete course of antibiotics.  Can use tessalon perles for cough as needed.  Go to ER if severe shortness of breath, call if not turning corner as expected.  Make close follow up appt with PCP early next week for re-eval.  We will call with radiologist reading of chest X-ray.

## 2017-01-13 NOTE — Assessment & Plan Note (Addendum)
Rhonchi left lung.. Test for flu and check CXR.  Has had some increase in weight and bilateral edema but not more so than usual per wife.  Addendum: Flu test neg, CXR shows no definate fluid or infilatrate.. Will await radiology read.   Given pt age, O2 sat lower than nml as well as tachycardia... Will have pt push fluids, start doxy for atypical bacterial etiology of bronchitis. Recommend close follow up  early next week.

## 2017-01-13 NOTE — Progress Notes (Signed)
Subjective:    Patient ID: Frank Simpson, male    DOB: May 04, 1931, 82 y.o.   MRN: [id number]  HPI   83 year old male pt of Dr. Darnell Level with history of  DM, CHF, CAD, CKD, recent subdural hematoma, frequent falls presents with new onset cough. He and his wife report cough  In last week, dry , nonproductive. Sore in stomach with coughing. No fever, no SOB. Has noted wheeze off and on. No ear pain, no sinus pain. No ST.  No new mental status changes.  No  D/C, no emesis.  He is using coricidin for congestion.  Drinking fluids.. Some slight decrease in last few days.  No sick contacts.  Blood pressure 130/60, pulse (!) 103, temperature 97.8 F (36.6 C), temperature source Oral, height 6' (1.829 m), weight 237 lb (107.5 kg), SpO2 93 %.   Wt Readings from Last 3 Encounters:  01/13/17 237 lb (107.5 kg)  12/16/16 235 lb (106.6 kg)  11/16/16 231 lb 12.8 oz (105.1 kg)     Review of Systems  Constitutional: Positive for fatigue. Negative for fever.  HENT: Negative for ear pain.   Eyes: Negative for pain.  Respiratory: Positive for cough and wheezing. Negative for shortness of breath.   Cardiovascular: Negative for chest pain, palpitations and leg swelling.  Gastrointestinal: Positive for abdominal pain. Negative for blood in stool and constipation.  Genitourinary: Negative for dysuria.  Neurological:       No change in mental status per wife       Objective:   Physical Exam  Constitutional: Vital signs are normal. He appears well-developed and well-nourished.  Non-toxic appearance. He does not appear ill. No distress.  Elderly male in NAD, mild confusion  HENT:  Head: Normocephalic and atraumatic.  Right Ear: Hearing, tympanic membrane, external ear and ear canal normal. No tenderness. No foreign bodies. Tympanic membrane is not retracted and not bulging.  Left Ear: Hearing, tympanic membrane, external ear and ear canal normal. No tenderness. No foreign bodies. Tympanic  membrane is not retracted and not bulging.  Nose: Nose normal. No mucosal edema or rhinorrhea. Right sinus exhibits no maxillary sinus tenderness and no frontal sinus tenderness. Left sinus exhibits no maxillary sinus tenderness and no frontal sinus tenderness.  Mouth/Throat: Uvula is midline, oropharynx is clear and moist and mucous membranes are normal. Normal dentition. No dental caries. No oropharyngeal exudate or tonsillar abscesses.  Eyes: Conjunctivae, EOM and lids are normal. Pupils are equal, round, and reactive to light. Lids are everted and swept, no foreign bodies found.  Neck: Trachea normal, normal range of motion and phonation normal. Neck supple. Carotid bruit is not present. No thyroid mass and no thyromegaly present.  Cardiovascular: Normal rate, regular rhythm, S1 normal, S2 normal, normal heart sounds, intact distal pulses and normal pulses.  Exam reveals no gallop.   No murmur heard.  Bilateral swelling in ankles 1 plus, right greater than left.[name] Kitchen Per wife stable.  Pulmonary/Chest: Effort normal. No respiratory distress. He has decreased breath sounds. He has no wheezes. He has rhonchi in the right lower field. He has no rales.  Abdominal: Soft. Normal appearance and bowel sounds are normal. There is no hepatosplenomegaly. There is no tenderness. There is no rebound, no guarding and no CVA tenderness. No hernia.  Neurological: He is alert. He has normal reflexes.  Skin: Skin is warm, dry and intact. No rash noted.  Psychiatric: He has a normal mood and affect. His speech is normal and  behavior is normal. Judgment normal.          Assessment & Plan:

## 2017-01-13 NOTE — Assessment & Plan Note (Signed)
No definite exacerbation but will  have follow closely with PCP given increase in weight. Pt not currently on diuretic.

## 2017-01-14 DIAGNOSIS — S63502A Unspecified sprain of left wrist, initial encounter: Secondary | ICD-10-CM | POA: Diagnosis not present

## 2017-01-14 DIAGNOSIS — M25532 Pain in left wrist: Secondary | ICD-10-CM | POA: Diagnosis not present

## 2017-01-17 ENCOUNTER — Telehealth: Payer: Self-pay | Admitting: Cardiology

## 2017-01-17 ENCOUNTER — Ambulatory Visit (INDEPENDENT_AMBULATORY_CARE_PROVIDER_SITE_OTHER): Payer: Medicare Other | Admitting: Family Medicine

## 2017-01-17 ENCOUNTER — Encounter: Payer: Self-pay | Admitting: Family Medicine

## 2017-01-17 ENCOUNTER — Encounter: Payer: Medicare Other | Admitting: *Deleted

## 2017-01-17 VITALS — BP 128/78 | HR 82 | Temp 97.6°F | Wt 234.1 lb

## 2017-01-17 DIAGNOSIS — Z794 Long term (current) use of insulin: Secondary | ICD-10-CM

## 2017-01-17 DIAGNOSIS — R05 Cough: Secondary | ICD-10-CM

## 2017-01-17 DIAGNOSIS — N183 Chronic kidney disease, stage 3 unspecified: Secondary | ICD-10-CM

## 2017-01-17 DIAGNOSIS — E1140 Type 2 diabetes mellitus with diabetic neuropathy, unspecified: Secondary | ICD-10-CM | POA: Diagnosis not present

## 2017-01-17 DIAGNOSIS — E113293 Type 2 diabetes mellitus with mild nonproliferative diabetic retinopathy without macular edema, bilateral: Secondary | ICD-10-CM | POA: Diagnosis not present

## 2017-01-17 DIAGNOSIS — I6200 Nontraumatic subdural hemorrhage, unspecified: Secondary | ICD-10-CM | POA: Diagnosis not present

## 2017-01-17 DIAGNOSIS — I5022 Chronic systolic (congestive) heart failure: Secondary | ICD-10-CM

## 2017-01-17 DIAGNOSIS — R059 Cough, unspecified: Secondary | ICD-10-CM

## 2017-01-17 DIAGNOSIS — S065X9A Traumatic subdural hemorrhage with loss of consciousness of unspecified duration, initial encounter: Secondary | ICD-10-CM

## 2017-01-17 DIAGNOSIS — S065XAA Traumatic subdural hemorrhage with loss of consciousness status unknown, initial encounter: Secondary | ICD-10-CM

## 2017-01-17 DIAGNOSIS — E1122 Type 2 diabetes mellitus with diabetic chronic kidney disease: Secondary | ICD-10-CM

## 2017-01-17 NOTE — Assessment & Plan Note (Signed)
Reviewed log they bring - adequate control in am and noon, PM tend to be high. No changes today. Check A1c next visit.  Lab Results  Component Value Date   HGBA1C 7.2 (H) 05/09/2016

## 2017-01-17 NOTE — Assessment & Plan Note (Signed)
Improving with doxy treatment. Possibly viral. rec finish abx course.  Discussed doxy shouldn't cause any urinary retention.

## 2017-01-17 NOTE — Telephone Encounter (Signed)
LMOVM reminding pt to send remote transmission.

## 2017-01-17 NOTE — Assessment & Plan Note (Signed)
Stable period. No further headaches.

## 2017-01-17 NOTE — Progress Notes (Signed)
BP 128/78 (BP Location: Right Arm, Patient Position: Sitting, Cuff Size: Normal)   Pulse 82   Temp 97.6 F (36.4 C) (Oral)   Wt 234 lb 1.9 oz (106.2 kg)   SpO2 95%   BMI 31.75 kg/m    CC: 32mof/u visit Subjective:    Patient ID: Frank Simpson male    DOB: [birth year] 83y.o.   MRN: [id number] HPI: WKELDRICK POMPLUNis a 83y.o. male presenting on 01/17/2017 for Follow-up   Recent subdural hematoma after fall s/p hospitalization 10/2016.  Seen last week by Dr BDiona Brownerwith cough - treated with doxycycline and tessalon perls. Wife decreased doxy to once daily due to concerns over urinary retention, and gave patient 1 flomax. Cough is improving.   Weight gain was noted - here for f/u. Reviewed non acute CXR.   Fall several weeks ago - tried to help wife with grocery bags. Injured L wrist. Saw ortho with normal xray - likely sprain. Brace in place today.   Relevant past medical, surgical, family and social history reviewed and updated as indicated. Interim medical history since our last visit reviewed. Allergies and medications reviewed and updated. Outpatient Medications Prior to Visit  Medication Sig Dispense Refill  . acetaminophen (TYLENOL) 500 MG tablet Take 500 mg by mouth every 6 (six) hours as needed (pain).     .[name] KitchenamLODipine (NORVASC) 5 MG tablet Take 1 tablet (5 mg total) by mouth daily. 90 tablet 2  . benzonatate (TESSALON) 200 MG capsule Take 1 capsule (200 mg total) by mouth 2 (two) times daily as needed for cough. 20 capsule 0  . doxycycline (VIBRA-TABS) 100 MG tablet Take 1 tablet (100 mg total) by mouth 2 (two) times daily. 20 tablet 0  . HUMALOG KWIKPEN 100 UNIT/ML KiwkPen INJECT 4 UNITS WITH        BREAKFAST, 8 UNITS WITH    LUNCH/DINNER PLUS SLIDING  SCALE (NEW DIRECTIONS) 30 mL 3  . Insulin Glargine (BASAGLAR KWIKPEN) 100 UNIT/ML SOPN Inject 0.5 mLs (50 Units total) into the skin daily.    . isosorbide mononitrate (IMDUR) 30 MG 24 hr tablet Take 1 tablet (30 mg  total) by mouth daily. 90 tablet 2  . Naphazoline HCl (CLEAR EYES OP) Place 1 drop into both eyes 3 (three) times daily.    . ONE TOUCH ULTRA TEST test strip USE TO CHECK SUGAR 4 TIMES DAILY. DX: E11.40 200 each 5  . rosuvastatin (CRESTOR) 20 MG tablet Take 1 tablet (20 mg total) by mouth daily. 90 tablet 3  . sertraline (ZOLOFT) 25 MG tablet Take 0.5 tablets (25 mg total) by mouth daily.    . nitroGLYCERIN (NITROSTAT) 0.4 MG SL tablet Place 1 tablet (0.4 mg total) under the tongue every 5 (five) minutes as needed for chest pain. Up to three doses,if pain continues call 911 25 tablet 3   No facility-administered medications prior to visit.      Per HPI unless specifically indicated in ROS section below Review of Systems     Objective:    BP 128/78 (BP Location: Right Arm, Patient Position: Sitting, Cuff Size: Normal)   Pulse 82   Temp 97.6 F (36.4 C) (Oral)   Wt 234 lb 1.9 oz (106.2 kg)   SpO2 95%   BMI 31.75 kg/m   Wt Readings from Last 3 Encounters:  01/17/17 234 lb 1.9 oz (106.2 kg)  01/13/17 237 lb (107.5 kg)  12/16/16 235 lb (106.6 kg)  Physical Exam  Constitutional: He appears well-developed and well-nourished. No distress.  HENT:  Mouth/Throat: Oropharynx is clear and moist. No oropharyngeal exudate.  Cardiovascular: Normal rate, regular rhythm, normal heart sounds and intact distal pulses.   No murmur heard. Pulmonary/Chest: Effort normal and breath sounds normal. No respiratory distress. He has no wheezes. He has no rales.  Lungs clear  Musculoskeletal: He exhibits edema (1+ pitting).  Skin: Skin is warm and dry. No rash noted.  Nursing note and vitals reviewed.     Assessment & Plan:   Problem List Items Addressed This Visit    Chronic systolic heart failure ([place])    rec check weights several times a week and jot down on sugar log to review. Not on diuretic at this time. No CHF exac sxs.       CKD stage 3 due to type 2 diabetes mellitus (HCC)   Cough -  Primary    Improving with doxy treatment. Possibly viral. rec finish abx course.  Discussed doxy shouldn't cause any urinary retention.       Diabetes mellitus type 2 with retinopathy (HCC)   SDH (subdural hematoma) (HCC)    Stable period. No further headaches.       Type 2 diabetes, controlled, with neuropathy ([place])    Reviewed log they bring - adequate control in am and noon, PM tend to be high. No changes today. Check A1c next visit.  Lab Results  Component Value Date   HGBA1C 7.2 (H) 05/09/2016             Follow up plan: Return in about 6 weeks (around 02/28/2017) for follow up visit.  [name], MD

## 2017-01-17 NOTE — Assessment & Plan Note (Signed)
rec check weights several times a week and jot down on sugar log to review. Not on diuretic at this time. No CHF exac sxs.

## 2017-01-17 NOTE — Patient Instructions (Addendum)
Recheck weight today.  Keep an eye on weight several times a week in the morning - and jot down on your sugar log.  Finish antibiotic. Lungs are sounding good today.  Reschedule follow up appointment in 1-2 months

## 2017-01-17 NOTE — Progress Notes (Signed)
Pre visit review using our clinic review tool, if applicable. No additional management support is needed unless otherwise documented below in the visit note.

## 2017-01-20 ENCOUNTER — Encounter: Payer: Self-pay | Admitting: Cardiology

## 2017-01-26 ENCOUNTER — Ambulatory Visit (INDEPENDENT_AMBULATORY_CARE_PROVIDER_SITE_OTHER): Payer: Medicare Other | Admitting: *Deleted

## 2017-01-26 DIAGNOSIS — I442 Atrioventricular block, complete: Secondary | ICD-10-CM

## 2017-01-30 NOTE — Progress Notes (Signed)
Remote pacemaker transmission.

## 2017-01-31 ENCOUNTER — Encounter: Payer: Self-pay | Admitting: Cardiology

## 2017-01-31 LAB — CUP PACEART REMOTE DEVICE CHECK
Battery Remaining Longevity: 121 mo
Battery Voltage: 3.02 V
Brady Statistic AP VS Percent: 17 %
Brady Statistic AS VP Percent: 13 %
Brady Statistic AS VS Percent: 54 %
Brady Statistic RA Percent Paced: 28 %
Brady Statistic RV Percent Paced: 27 %
Implantable Lead Implant Date: 20151109
Implantable Lead Location: 753859
Lead Channel Impedance Value: 390 Ohm
Lead Channel Pacing Threshold Amplitude: 1 V
Lead Channel Pacing Threshold Pulse Width: 0.4 ms
Lead Channel Sensing Intrinsic Amplitude: 12 mV
Lead Channel Sensing Intrinsic Amplitude: 5 mV
Lead Channel Setting Pacing Amplitude: 2 V
Lead Channel Setting Pacing Amplitude: 2.5 V
Lead Channel Setting Pacing Pulse Width: 0.4 ms
MDC IDC LEAD IMPLANT DT: 20151109
MDC IDC LEAD LOCATION: 753860
MDC IDC MSMT BATTERY REMAINING PERCENTAGE: 95.5 %
MDC IDC MSMT LEADCHNL RA PACING THRESHOLD AMPLITUDE: 1 V
MDC IDC MSMT LEADCHNL RA PACING THRESHOLD PULSEWIDTH: 0.4 ms
MDC IDC MSMT LEADCHNL RV IMPEDANCE VALUE: 510 Ohm
MDC IDC PG IMPLANT DT: 20151109
MDC IDC SESS DTM: 20180301191225
MDC IDC SET LEADCHNL RV SENSING SENSITIVITY: 2 mV
MDC IDC STAT BRADY AP VP PERCENT: 15 %
Pulse Gen Model: 2240
Pulse Gen Serial Number: 7666171

## 2017-02-07 DIAGNOSIS — L821 Other seborrheic keratosis: Secondary | ICD-10-CM | POA: Diagnosis not present

## 2017-02-07 DIAGNOSIS — Z85828 Personal history of other malignant neoplasm of skin: Secondary | ICD-10-CM | POA: Diagnosis not present

## 2017-02-07 DIAGNOSIS — D1801 Hemangioma of skin and subcutaneous tissue: Secondary | ICD-10-CM | POA: Diagnosis not present

## 2017-02-07 DIAGNOSIS — D485 Neoplasm of uncertain behavior of skin: Secondary | ICD-10-CM | POA: Diagnosis not present

## 2017-02-07 DIAGNOSIS — L570 Actinic keratosis: Secondary | ICD-10-CM | POA: Diagnosis not present

## 2017-02-08 ENCOUNTER — Ambulatory Visit: Payer: Self-pay | Admitting: Family Medicine

## 2017-02-28 ENCOUNTER — Ambulatory Visit: Payer: Medicare Other | Admitting: Family Medicine

## 2017-03-07 ENCOUNTER — Ambulatory Visit (INDEPENDENT_AMBULATORY_CARE_PROVIDER_SITE_OTHER): Payer: Medicare Other | Admitting: Family Medicine

## 2017-03-07 ENCOUNTER — Encounter: Payer: Self-pay | Admitting: Family Medicine

## 2017-03-07 VITALS — BP 134/82 | HR 68 | Temp 97.6°F | Wt 239.0 lb

## 2017-03-07 DIAGNOSIS — E1140 Type 2 diabetes mellitus with diabetic neuropathy, unspecified: Secondary | ICD-10-CM

## 2017-03-07 DIAGNOSIS — R296 Repeated falls: Secondary | ICD-10-CM | POA: Diagnosis not present

## 2017-03-07 DIAGNOSIS — Z794 Long term (current) use of insulin: Secondary | ICD-10-CM | POA: Diagnosis not present

## 2017-03-07 DIAGNOSIS — N183 Chronic kidney disease, stage 3 (moderate): Secondary | ICD-10-CM

## 2017-03-07 DIAGNOSIS — R600 Localized edema: Secondary | ICD-10-CM | POA: Diagnosis not present

## 2017-03-07 DIAGNOSIS — E1122 Type 2 diabetes mellitus with diabetic chronic kidney disease: Secondary | ICD-10-CM | POA: Diagnosis not present

## 2017-03-07 DIAGNOSIS — I5022 Chronic systolic (congestive) heart failure: Secondary | ICD-10-CM | POA: Diagnosis not present

## 2017-03-07 DIAGNOSIS — E113293 Type 2 diabetes mellitus with mild nonproliferative diabetic retinopathy without macular edema, bilateral: Secondary | ICD-10-CM | POA: Diagnosis not present

## 2017-03-07 LAB — BRAIN NATRIURETIC PEPTIDE: Pro B Natriuretic peptide (BNP): 379 pg/mL — ABNORMAL HIGH (ref 0.0–100.0)

## 2017-03-07 LAB — RENAL FUNCTION PANEL
Albumin: 3.7 g/dL (ref 3.5–5.2)
BUN: 30 mg/dL — ABNORMAL HIGH (ref 6–23)
CO2: 26 meq/L (ref 19–32)
Calcium: 9.3 mg/dL (ref 8.4–10.5)
Chloride: 102 mEq/L (ref 96–112)
Creatinine, Ser: 2.12 mg/dL — ABNORMAL HIGH (ref 0.40–1.50)
GFR: 31.77 mL/min — AB (ref >60.00)
Glucose, Bld: 141 mg/dL — ABNORMAL HIGH (ref 70–99)
PHOSPHORUS: 2.7 mg/dL (ref 2.3–4.6)
Potassium: 4.3 mEq/L (ref 3.5–5.1)
Sodium: 136 mEq/L (ref 135–145)

## 2017-03-07 LAB — HEMOGLOBIN A1C: Hgb A1c MFr Bld: 7.6 % — ABNORMAL HIGH (ref 4.6–6.5)

## 2017-03-07 MED ORDER — TAMSULOSIN HCL 0.4 MG PO CAPS: 0.4000 mg | ORAL_CAPSULE | Freq: Every day | ORAL | 1 refills | Status: DC

## 2017-03-07 MED ORDER — BASAGLAR KWIKPEN 100 UNIT/ML ~~LOC~~ SOPN: 50.0000 [IU] | PEN_INJECTOR | Freq: Every day | SUBCUTANEOUS | 3 refills | Status: DC

## 2017-03-07 NOTE — Progress Notes (Signed)
Pre visit review using our clinic review tool, if applicable. No additional management support is needed unless otherwise documented below in the visit note.

## 2017-03-07 NOTE — Assessment & Plan Note (Signed)
Update Cr.

## 2017-03-07 NOTE — Patient Instructions (Addendum)
Bring me humalog sliding scale along with sugar log for next visit.  Labs today.  Continue current medicines.  Use compression stockings for leg swelling. Prescription provided today.  If not effecting to help control swelling, we may consider water pill.  Return as needed or in 3-4 months for follow up visit.

## 2017-03-07 NOTE — Progress Notes (Signed)
BP 134/82   Pulse 68   Temp 97.6 F (36.4 C) (Oral)   Wt 239 lb (108.4 kg)   BMI 32.41 kg/m    CC: f/u visit Subjective:    Patient ID: Frank Simpson, male    DOB: [birth year], 83 y.o.   MRN: [id number]  HPI: Frank Simpson is a 83 y.o. male presenting on 03/07/2017 for Follow-up   Chronic sCHF - reviewed recent weights. 230-245lbs. Endorses some leg swelling.   DM - regularly does check sugars - log reviewed. AM 80-140s, lunch 150-230s, dinner 200s. Compliant with antihyperglycemic regimen which includes: humalog kwikpen 4u breakfast, 8u lunch/dinner plus SSI, basaglar 50u daily. Denies low sugars or hypoglycemic symptoms. Denies paresthesias. Last diabetic eye exam 06/2016.  Pneumovax: 2010.  Prevnar: 2015. Lab Results  Component Value Date   HGBA1C 7.2 (H) 05/09/2016   Diabetic Foot Exam - Simple   Simple Foot Form Diabetic Foot exam was performed with the following findings:  Yes 03/07/2017 12:41 PM  Visual Inspection See comments:  Yes Sensation Testing Intact to touch and monofilament testing bilaterally:  Yes Pulse Check Posterior Tibialis and Dorsalis pulse intact bilaterally:  Yes Comments Some skin maceration between 4th/5th L digits      Relevant past medical, surgical, family and social history reviewed and updated as indicated. Interim medical history since our last visit reviewed. Allergies and medications reviewed and updated. Outpatient Medications Prior to Visit  Medication Sig Dispense Refill  . acetaminophen (TYLENOL) 500 MG tablet Take 500 mg by mouth every 6 (six) hours as needed (pain).     [name] Kitchen amLODipine (NORVASC) 5 MG tablet Take 1 tablet (5 mg total) by mouth daily. 90 tablet 2  . HUMALOG KWIKPEN 100 UNIT/ML KiwkPen INJECT 4 UNITS WITH        BREAKFAST, 8 UNITS WITH    LUNCH/DINNER PLUS SLIDING  SCALE (NEW DIRECTIONS) 30 mL 3  . isosorbide mononitrate (IMDUR) 30 MG 24 hr tablet Take 1 tablet (30 mg total) by mouth daily. 90 tablet 2  . Naphazoline  HCl (CLEAR EYES OP) Place 1 drop into both eyes 3 (three) times daily.    . ONE TOUCH ULTRA TEST test strip USE TO CHECK SUGAR 4 TIMES DAILY. DX: E11.40 200 each 5  . rosuvastatin (CRESTOR) 20 MG tablet Take 1 tablet (20 mg total) by mouth daily. 90 tablet 3  . sertraline (ZOLOFT) 25 MG tablet Take 0.5 tablets (25 mg total) by mouth daily.    . Insulin Glargine (BASAGLAR KWIKPEN) 100 UNIT/ML SOPN Inject 0.5 mLs (50 Units total) into the skin daily.    . nitroGLYCERIN (NITROSTAT) 0.4 MG SL tablet Place 1 tablet (0.4 mg total) under the tongue every 5 (five) minutes as needed for chest pain. Up to three doses,if pain continues call 911 25 tablet 3  . benzonatate (TESSALON) 200 MG capsule Take 1 capsule (200 mg total) by mouth 2 (two) times daily as needed for cough. 20 capsule 0  . doxycycline (VIBRA-TABS) 100 MG tablet Take 1 tablet (100 mg total) by mouth 2 (two) times daily. 20 tablet 0   No facility-administered medications prior to visit.      Per HPI unless specifically indicated in ROS section below Review of Systems     Objective:    BP 134/82   Pulse 68   Temp 97.6 F (36.4 C) (Oral)   Wt 239 lb (108.4 kg)   BMI 32.41 kg/m   Wt Readings from Last 3 Encounters:  03/07/17 239 lb (108.4 kg)  01/17/17 234 lb 1.9 oz (106.2 kg)  01/13/17 237 lb (107.5 kg)    Physical Exam  Constitutional: He appears well-developed and well-nourished. No distress.  HENT:  Head: Normocephalic and atraumatic.  Mouth/Throat: Oropharynx is clear and moist. No oropharyngeal exudate.  Eyes: Conjunctivae and EOM are normal. Pupils are equal, round, and reactive to light. No scleral icterus.  Neck: Normal range of motion. Neck supple.  Cardiovascular: Normal rate, regular rhythm, normal heart sounds and intact distal pulses.   No murmur heard. Pulmonary/Chest: Effort normal and breath sounds normal. No respiratory distress. He has no wheezes. He has no rales.  Musculoskeletal: He exhibits edema (1+  L>R).  See HPI for foot exam if done  Lymphadenopathy:    He has no cervical adenopathy.  Skin: Skin is warm and dry. No rash noted.  Psychiatric: He has a normal mood and affect.  Nursing note and vitals reviewed.     Assessment & Plan:   Problem List Items Addressed This Visit    Chronic systolic heart failure ([place])    Discussed regular weights. Check BNP. Consider diuretic (avoid at this time given age and fall risk).       Relevant Orders   Brain natriuretic peptide   CKD stage 3 due to type 2 diabetes mellitus ([place])    Update Cr.       Relevant Medications   Insulin Glargine (BASAGLAR KWIKPEN) 100 UNIT/ML SOPN   Other Relevant Orders   Renal function panel   Diabetes mellitus type 2 with retinopathy (HCC)   Relevant Medications   Insulin Glargine (BASAGLAR KWIKPEN) 100 UNIT/ML SOPN   Frequent falls   Pedal edema    Avoid diuretics given age and fall risk.  rec trial compression stocking and update with effect.       Type 2 diabetes, controlled, with neuropathy ([place]) - Primary    Chronic. Sugar log reviewed.  Wife reports compliance with basaglar and humalog - but unclear SSI dosing. I asked them to bring this in next visit to review. Wife states she's been increasing breakfast mealtime coverage dose to 8u and continued other meal coverage doses the same, then added SSI.  Update A1c.       Relevant Medications   Insulin Glargine (BASAGLAR KWIKPEN) 100 UNIT/ML SOPN   Other Relevant Orders   Hemoglobin A1c       Follow up plan: Return in about 4 months (around 07/07/2017) for follow up visit.  [name], MD

## 2017-03-07 NOTE — Assessment & Plan Note (Signed)
Discussed regular weights. Check BNP. Consider diuretic (avoid at this time given age and fall risk).

## 2017-03-07 NOTE — Assessment & Plan Note (Signed)
Chronic. Sugar log reviewed.  Wife reports compliance with basaglar and humalog - but unclear SSI dosing. I asked them to bring this in next visit to review. Wife states she's been increasing breakfast mealtime coverage dose to 8u and continued other meal coverage doses the same, then added SSI.  Update A1c.

## 2017-03-07 NOTE — Assessment & Plan Note (Signed)
Avoid diuretics given age and fall risk.  rec trial compression stocking and update with effect.

## 2017-03-17 ENCOUNTER — Ambulatory Visit (INDEPENDENT_AMBULATORY_CARE_PROVIDER_SITE_OTHER): Payer: Medicare Other | Admitting: Family Medicine

## 2017-03-17 ENCOUNTER — Encounter: Payer: Self-pay | Admitting: Family Medicine

## 2017-03-17 ENCOUNTER — Other Ambulatory Visit (HOSPITAL_COMMUNITY): Payer: Self-pay | Admitting: Family Medicine

## 2017-03-17 VITALS — BP 126/62 | HR 93 | Temp 97.6°F | Wt 239.8 lb

## 2017-03-17 DIAGNOSIS — R1310 Dysphagia, unspecified: Secondary | ICD-10-CM | POA: Diagnosis not present

## 2017-03-17 DIAGNOSIS — R300 Dysuria: Secondary | ICD-10-CM

## 2017-03-17 DIAGNOSIS — N390 Urinary tract infection, site not specified: Secondary | ICD-10-CM | POA: Insufficient documentation

## 2017-03-17 DIAGNOSIS — N3001 Acute cystitis with hematuria: Secondary | ICD-10-CM | POA: Diagnosis not present

## 2017-03-17 DIAGNOSIS — R1319 Other dysphagia: Secondary | ICD-10-CM

## 2017-03-17 DIAGNOSIS — R1313 Dysphagia, pharyngeal phase: Secondary | ICD-10-CM

## 2017-03-17 DIAGNOSIS — E1140 Type 2 diabetes mellitus with diabetic neuropathy, unspecified: Secondary | ICD-10-CM | POA: Diagnosis not present

## 2017-03-17 HISTORY — DX: Dysphagia, pharyngeal phase: R13.13

## 2017-03-17 LAB — POC URINALSYSI DIPSTICK (AUTOMATED)
BILIRUBIN UA: NEGATIVE
GLUCOSE UA: NEGATIVE
Ketones, UA: NEGATIVE
NITRITE UA: POSITIVE
Spec Grav, UA: >=1.03 — AB (ref 1.010–1.025)
Urobilinogen, UA: 0.2 E.U./dL
pH, UA: 7.5 (ref 5.0–8.0)

## 2017-03-17 MED ORDER — CEFTRIAXONE SODIUM 1 G IJ SOLR
1.0000 g | Freq: Once | INTRAMUSCULAR | Status: AC
  Administered 2017-03-17: 1 g via INTRAMUSCULAR

## 2017-03-17 MED ORDER — CEPHALEXIN 500 MG PO CAPS: 500.0000 mg | ORAL_CAPSULE | Freq: Two times a day (BID) | ORAL | 0 refills | Status: DC

## 2017-03-17 NOTE — Assessment & Plan Note (Signed)
Reviewed SSI.  Continue current regimen.

## 2017-03-17 NOTE — Progress Notes (Signed)
BP 126/62   Pulse 93   Temp 97.6 F (36.4 C) (Oral)   Wt 239 lb 12 oz (108.7 kg)   SpO2 95%   BMI 32.52 kg/m    CC: dysuria, hematuria Subjective:    Patient ID: Frank Simpson, male    DOB: 1931-01-13, 83 y.o.   MRN: [id number]  HPI: CASSIDY TABET is a 83 y.o. male presenting on 03/17/2017 for Hematuria and Dysuria   2-3d h/o lethargy/fatigue. Not feeling well. Urine was darker to reddish. Increased frequency, urgency, dysuria, hematuria. No fevers, abd pain, flank pain, nausea/vomiting.   No recent UTI.   Noticing increased choking with any fluids for the past year. They have forgot to mention this. No trouble with solids. h/o CVA. No recent swallow evaluation.   Brings sliding scale insulin: 4 units with breakfast 8 units with lunch and dinner <150: no extra insulin 150-200: 2 units 201-250: 4 units 251-300: 8 units  351-400: 10 units >400: 12 units Basaglar 50u daily in am.   Brings glucose log which was reviewed and will be scanned. No low sugars. PM elevated to 300, otherwise well controlled. Fasting am 70s-90s.  Lab Results  Component Value Date   HGBA1C 7.6 (H) 03/07/2017    Relevant past medical, surgical, family and social history reviewed and updated as indicated. Interim medical history since our last visit reviewed. Allergies and medications reviewed and updated. Outpatient Medications Prior to Visit  Medication Sig Dispense Refill  . acetaminophen (TYLENOL) 500 MG tablet Take 500 mg by mouth every 6 (six) hours as needed (pain).     [name] Kitchen amLODipine (NORVASC) 5 MG tablet Take 1 tablet (5 mg total) by mouth daily. 90 tablet 2  . HUMALOG KWIKPEN 100 UNIT/ML KiwkPen INJECT 4 UNITS WITH        BREAKFAST, 8 UNITS WITH    LUNCH/DINNER PLUS SLIDING  SCALE (NEW DIRECTIONS) 30 mL 3  . Insulin Glargine (BASAGLAR KWIKPEN) 100 UNIT/ML SOPN Inject 0.5 mLs (50 Units total) into the skin daily. 15 pen 3  . isosorbide mononitrate (IMDUR) 30 MG 24 hr tablet Take 1 tablet  (30 mg total) by mouth daily. 90 tablet 2  . Naphazoline HCl (CLEAR EYES OP) Place 1 drop into both eyes 3 (three) times daily.    . ONE TOUCH ULTRA TEST test strip USE TO CHECK SUGAR 4 TIMES DAILY. DX: E11.40 200 each 5  . rosuvastatin (CRESTOR) 20 MG tablet Take 1 tablet (20 mg total) by mouth daily. 90 tablet 3  . sertraline (ZOLOFT) 25 MG tablet Take 0.5 tablets (25 mg total) by mouth daily.    . tamsulosin (FLOMAX) 0.4 MG CAPS capsule Take 1 capsule (0.4 mg total) by mouth daily. 90 capsule 1  . nitroGLYCERIN (NITROSTAT) 0.4 MG SL tablet Place 1 tablet (0.4 mg total) under the tongue every 5 (five) minutes as needed for chest pain. Up to three doses,if pain continues call 911 25 tablet 3   No facility-administered medications prior to visit.      Per HPI unless specifically indicated in ROS section below Review of Systems     Objective:    BP 126/62   Pulse 93   Temp 97.6 F (36.4 C) (Oral)   Wt 239 lb 12 oz (108.7 kg)   SpO2 95%   BMI 32.52 kg/m   Wt Readings from Last 3 Encounters:  03/17/17 239 lb 12 oz (108.7 kg)  03/07/17 239 lb (108.4 kg)  01/17/17 234 lb 1.9  oz (106.2 kg)    Physical Exam  Constitutional: He appears well-developed and well-nourished. No distress.  HENT:  Mouth/Throat: Oropharynx is clear and moist. No oropharyngeal exudate.  Cardiovascular: Normal rate, regular rhythm, normal heart sounds and intact distal pulses.   No murmur heard. Pulmonary/Chest: Effort normal and breath sounds normal. No respiratory distress. He has no wheezes. He has no rales.  Abdominal: Soft. Normal appearance and bowel sounds are normal. He exhibits no distension and no mass. There is no tenderness. There is no rebound, no guarding and no CVA tenderness.  Musculoskeletal: He exhibits no edema.  Skin: Skin is warm and dry.  Psychiatric: He has a normal mood and affect.  Nursing note and vitals reviewed.  Results for orders placed or performed in visit on 03/17/17  POCT  Urinalysis Dipstick (Automated)  Result Value Ref Range   Color, UA brown    Clarity, UA very cloudy/milky    Glucose, UA neg    Bilirubin, UA neg    Ketones, UA neg    Spec Grav, UA >=1.030 (A) 1.010 - 1.025   Blood, UA 3+    pH, UA 7.5 5.0 - 8.0   Protein, UA 2+    Urobilinogen, UA 0.2 0.2 or 1.0 E.U./dL   Nitrite, UA pos    Leukocytes, UA Moderate (2+) (A) Negative      Assessment & Plan:   Problem List Items Addressed This Visit    Dysphagia    Liquid dysphagia possible late effect of stroke. Will refer to speech therapy for MBS and recs on diet modifications. Pt and wife agree with plan.       Relevant Orders   SLP modified barium swallow   Type 2 diabetes, controlled, with neuropathy ([place])    Reviewed SSI.  Continue current regimen.       UTI (urinary tract infection) - Primary    Urine thick and dark, micro TNTC WBC and pyuria.  Story consistent with acute UTI. Overall nontoxic so I think ok for outpatient management.  Will treat acutely with rocephin 1gm IM (in CKD). Will send in keflex antibiotic to complete 7 d course.  Pt and wife agree with plan.       Relevant Medications   cephALEXin (KEFLEX) 500 MG capsule    Other Visit Diagnoses    Dysuria       Relevant Orders   POCT Urinalysis Dipstick (Automated) (Completed)   Urine culture       Follow up plan: Return if symptoms worsen or fail to improve.  [name], MD

## 2017-03-17 NOTE — Assessment & Plan Note (Signed)
Liquid dysphagia possible late effect of stroke. Will refer to speech therapy for MBS and recs on diet modifications. Pt and wife agree with plan.

## 2017-03-17 NOTE — Progress Notes (Signed)
Pre visit review using our clinic review tool, if applicable. No additional management support is needed unless otherwise documented below in the visit note.

## 2017-03-17 NOTE — Assessment & Plan Note (Signed)
Urine thick and dark, micro TNTC WBC and pyuria.  Story consistent with acute UTI. Overall nontoxic so I think ok for outpatient management.  Will treat acutely with rocephin 1gm IM (in CKD). Will send in keflex antibiotic to complete 7 d course.  Pt and wife agree with plan.

## 2017-03-17 NOTE — Addendum Note (Signed)
Addended by: Modena Nunnery on: 03/17/2017 11:30 AM   Modules accepted: Orders

## 2017-03-17 NOTE — Patient Instructions (Addendum)
You do have a bad urine infection - treated today with shot of antibiotic, then start antibiotic sent to pharmacy.  Push fluids and rest. Continue current insulin dosing.  We will refer you for swallow study - see Rosaria Ferries on your way out if you can.    Urinary Tract Infection, Adult A urinary tract infection (UTI) is an infection of any part of the urinary tract, which includes the kidneys, ureters, bladder, and urethra. These organs make, store, and get rid of urine in the body. UTI can be a bladder infection (cystitis) or kidney infection (pyelonephritis). What are the causes? This infection may be caused by fungi, viruses, or bacteria. Bacteria are the most common cause of UTIs. This condition can also be caused by repeated incomplete emptying of the bladder during urination. What increases the risk? This condition is more likely to develop if:  You ignore your need to urinate or hold urine for long periods of time.  You do not empty your bladder completely during urination.  You wipe back to front after urinating or having a bowel movement, if you are male.  You are uncircumcised, if you are male.  You are constipated.  You have a urinary catheter that stays in place (indwelling).  You have a weak defense (immune) system.  You have a medical condition that affects your bowels, kidneys, or bladder.  You have diabetes.  You take antibiotic medicines frequently or for long periods of time, and the antibiotics no longer work well against certain types of infections (antibiotic resistance).  You take medicines that irritate your urinary tract.  You are exposed to chemicals that irritate your urinary tract.  You are male. What are the signs or symptoms? Symptoms of this condition include:  Fever.  Frequent urination or passing small amounts of urine frequently.  Needing to urinate urgently.  Pain or burning with urination.  Urine that smells bad or unusual.  Cloudy  urine.  Pain in the lower abdomen or back.  Trouble urinating.  Blood in the urine.  Vomiting or being less hungry than normal.  Diarrhea or abdominal pain.  Vaginal discharge, if you are male. How is this diagnosed? This condition is diagnosed with a medical history and physical exam. You will also need to provide a urine sample to test your urine. Other tests may be done, including:  Blood tests.  Sexually transmitted disease (STD) testing. If you have had more than one UTI, a cystoscopy or imaging studies may be done to determine the cause of the infections. How is this treated? Treatment for this condition often includes a combination of two or more of the following:  Antibiotic medicine.  Other medicines to treat less common causes of UTI.  Over-the-counter medicines to treat pain.  Drinking enough water to stay hydrated. Follow these instructions at home:  Take over-the-counter and prescription medicines only as told by your health care provider.  If you were prescribed an antibiotic, take it as told by your health care provider. Do not stop taking the antibiotic even if you start to feel better.  Avoid alcohol, caffeine, tea, and carbonated beverages. They can irritate your bladder.  Drink enough fluid to keep your urine clear or pale yellow.  Keep all follow-up visits as told by your health care provider. This is important.  Make sure to:  Empty your bladder often and completely. Do not hold urine for long periods of time.  Empty your bladder before and after sex.  Wipe from  front to back after a bowel movement if you are male. Use each tissue one time when you wipe. Contact a health care provider if:  You have back pain.  You have a fever.  You feel nauseous or vomit.  Your symptoms do not get better after 3 days.  Your symptoms go away and then return. Get help right away if:  You have severe back pain or lower abdominal pain.  You are  vomiting and cannot keep down any medicines or water. This information is not intended to replace advice given to you by your health care provider. Make sure you discuss any questions you have with your health care provider. Document Released: 08/24/2005 Document Revised: 04/27/2016 Document Reviewed: 10/05/2015 Elsevier Interactive Patient Education  2017 [employer].

## 2017-03-20 ENCOUNTER — Ambulatory Visit (INDEPENDENT_AMBULATORY_CARE_PROVIDER_SITE_OTHER): Payer: Medicare Other | Admitting: Podiatry

## 2017-03-20 ENCOUNTER — Encounter: Payer: Self-pay | Admitting: Podiatry

## 2017-03-20 VITALS — BP 135/86 | HR 51

## 2017-03-20 DIAGNOSIS — M2041 Other hammer toe(s) (acquired), right foot: Secondary | ICD-10-CM

## 2017-03-20 DIAGNOSIS — M2042 Other hammer toe(s) (acquired), left foot: Secondary | ICD-10-CM

## 2017-03-20 DIAGNOSIS — E1149 Type 2 diabetes mellitus with other diabetic neurological complication: Secondary | ICD-10-CM

## 2017-03-20 DIAGNOSIS — M79675 Pain in left toe(s): Secondary | ICD-10-CM | POA: Diagnosis not present

## 2017-03-20 DIAGNOSIS — B351 Tinea unguium: Secondary | ICD-10-CM | POA: Diagnosis not present

## 2017-03-20 DIAGNOSIS — M79674 Pain in right toe(s): Secondary | ICD-10-CM | POA: Diagnosis not present

## 2017-03-20 NOTE — Patient Instructions (Signed)

## 2017-03-20 NOTE — Progress Notes (Signed)
Subjective:    Patient ID: Frank Simpson, male    DOB: [birth year], 83 y.o.   MRN: [id number]  HPI 83 year old male presents the office they for concerns of thick, painful, elongated toenails that he cannot trim himself. Denies any redness or drainage from the toenail sites. He is also requesting diabetic shoes today. He denies any open sores. He has no other complaints today.    Review of Systems  Cardiovascular: Positive for leg swelling.       Calf pain with walking  Musculoskeletal: Positive for gait problem.       Joint pain  Neurological: Positive for weakness and headaches.  Hematological: Bruises/bleeds easily.       Slow to heal  Psychiatric/Behavioral: Positive for confusion.       Objective:   Physical Exam General: AAO x3, NAD  Dermatological: Nails are hypertrophic, dystrophic, brittle, discolored, elongated 10. No surrounding redness or drainage. Tenderness nails 1-5 bilaterally. No open lesions or pre-ulcerative lesions are identified today.  Vascular: Dorsalis Pedis artery and Posterior Tibial artery pedal pulses are 1/4 bilateral with immedate capillary fill time.  There is no pain with calf compression, swelling, warmth, erythema.   Neruologic: Sensation decreased with SWMF.   Musculoskeletal: Hammertoes are present. No gross boney pedal deformities bilateral. No pain, crepitus, or limitation noted with foot and ankle range of motion bilateral. Muscular strength 5/5 in all groups tested bilateral.     Assessment & Plan:  83 year old male with symptomatic onychomycosis; requesting diabetic shoes.  -Treatment options discussed including all alternatives, risks, and complications -Etiology of symptoms were discussed -Nails debrided 10 without complications or bleeding. -Paperwork was completed today for diabetic shoes. He was also measured today for them.  -Daily foot inspection -Follow-up in 3 months or sooner if any problems arise. In the meantime,  encouraged to call the office with any questions, concerns, change in symptoms.   Celesta Gentile, DPM

## 2017-03-22 LAB — URINE CULTURE

## 2017-03-23 ENCOUNTER — Ambulatory Visit (HOSPITAL_COMMUNITY)
Admission: RE | Admit: 2017-03-23 | Discharge: 2017-03-23 | Disposition: A | Discharge status: Home / Self Care | Payer: Medicare Other | Source: Ambulatory Visit | Attending: Family Medicine | Admitting: Family Medicine

## 2017-03-23 DIAGNOSIS — R1319 Other dysphagia: Secondary | ICD-10-CM | POA: Diagnosis not present

## 2017-03-23 DIAGNOSIS — R05 Cough: Secondary | ICD-10-CM | POA: Diagnosis not present

## 2017-03-23 DIAGNOSIS — R1310 Dysphagia, unspecified: Secondary | ICD-10-CM | POA: Insufficient documentation

## 2017-03-24 NOTE — Progress Notes (Signed)
03/23/17 1000  SLP Visit Information  SLP Received On 03/24/17  General Information  HPI Pt is an 83 year old male arriving for an outpatient MBS due to reprot to MD of increasingly frequent coughign with liquids over the last year. Pt ahs a history of brain injury after a fall in 2015 with inpatient rehab stay, no dysphagia at that time. Last CXR two view on 2/16//18 clear.   Type of Study MBS-Modified Barium Swallow Study  Diet Prior to this Study Regular;Thin liquids  Temperature Spikes Noted No  Respiratory Status Room air  History of Recent Intubation No  Behavior/Cognition Alert;Cooperative;Pleasant mood  Oral Cavity Assessment WFL  Oral Care Completed by SLP No  Oral Cavity - Dentition Adequate natural dentition  Vision Functional for self feeding  Self-Feeding Abilities Able to feed self  Patient Positioning Upright in chair  Baseline Vocal Quality Low vocal intensity  Volitional Cough Strong  Volitional Swallow Able to elicit  Anatomy WFL  Pharyngeal Secretions Not observed secondary MBS  Oral Motor/Sensory Function  Overall Oral Motor/Sensory Function Other (comment) (slow movement)  Oral Preparation/Oral Phase  Oral Phase WFL  Pharyngeal Phase  Pharyngeal Phase Impaired  Pharyngeal - Nectar  Pharyngeal- Nectar Cup Delayed swallow initiation-pyriform sinuses  Pharyngeal- Nectar Straw Delayed swallow initiation-pyriform sinuses  Pharyngeal - Thin  Pharyngeal- Thin Cup Delayed swallow initiation-pyriform sinuses;Penetration/Aspiration before swallow;Trace aspiration;Compensatory strategies attempted (with notebox) (chin tuck, small sips)  Pharyngeal- Thin Straw Delayed swallow initiation-pyriform sinuses;Penetration/Aspiration before swallow;Trace aspiration;Compensatory strategies attempted (with notebox)  Pharyngeal Material enters airway, passes BELOW cords without attempt by patient to eject out (silent aspiration);Material enters airway, CONTACTS cords and not  ejected out  Pharyngeal Material enters airway, passes BELOW cords without attempt by patient to eject out (silent aspiration)  Pharyngeal - Solids  Pharyngeal- Puree Delayed swallow initiation-vallecula  Pharyngeal- Regular Delayed swallow initiation-vallecula  Clinical Impression  Clinical Impression Pt demonstrates primary sensory impairment leading to trace silent penetration and aspiration events before the swallow. Strength is WNL, but initiation of swallow/laryngeal vestibule closure is late, allowing thin liquids to spill into the vestibule and sometimes past the cords without sensation from pt. Neither a chin tuck or limiting to a very small bolus prevented aspiration. Nectar thick liquids were not aspirated despite large continuous sips. Least restrictive strategy to decrease quantitiy of aspriation and decrease risk of pulmonary infection is both 1) encouraging frequent oral care and 2) traning the pt in a preventative throat clear to eject penetrate/aspirate and swallow again to clear. This strategy was effective in MBS. Discussed at length with pts wife and provided written instruction. Given that pt has not suffered pulmonary infection at this time, would not restrict diet by thickening liquids unless there is a future decline in functional reserve. Recommend pt f/u with outpatient SLP for training in strategies and further education.   SLP Visit Diagnosis Dysphagia, oropharyngeal phase (R13.12)  Impact on safety and function Moderate aspiration risk  Swallow Evaluation Recommendations  SLP Diet Recommendations Regular solids;Thin liquid  Liquid Administration via Cup  Medication Administration Whole meds with liquid  Supervision Patient able to self feed;Intermittent supervision to cue for compensatory strategies (supervision from wife)  Compensations Clear throat after each swallow;Multiple dry swallows after each bite/sip  Postural Changes Remain semi-upright after after feeds/meals  (Comment);Seated upright at 90 degrees  Treatment Plan  Oral Care Recommendations Oral care before and after PO  Treatment Recommendations Defer treatment plan to f/u with SLP  Follow up Recommendations Outpatient SLP  Individuals Consulted  Consulted and Agree with Results and Recommendations Patient;Family member/caregiver  Family Member Consulted wife  SLP Time Calculation  SLP Start Time (ACUTE ONLY) 1115  SLP Stop Time (ACUTE ONLY) 1145  SLP Time Calculation (min) (ACUTE ONLY) 30 min  SLP G-Codes **NOT FOR INPATIENT CLASS**  Functional Assessment Tool Used clinical judgement  Functional Limitations Swallowing  Swallow Current Status (H9622) CJ  Swallow Goal Status (W9798) CJ  Swallow Discharge Status (X2119) CJ  SLP Evaluations  $Swallowing Treatment 1 Procedure

## 2017-03-28 DIAGNOSIS — H04123 Dry eye syndrome of bilateral lacrimal glands: Secondary | ICD-10-CM | POA: Diagnosis not present

## 2017-03-28 DIAGNOSIS — E113291 Type 2 diabetes mellitus with mild nonproliferative diabetic retinopathy without macular edema, right eye: Secondary | ICD-10-CM | POA: Diagnosis not present

## 2017-03-28 DIAGNOSIS — E119 Type 2 diabetes mellitus without complications: Secondary | ICD-10-CM | POA: Diagnosis not present

## 2017-03-28 DIAGNOSIS — H40013 Open angle with borderline findings, low risk, bilateral: Secondary | ICD-10-CM | POA: Diagnosis not present

## 2017-03-28 LAB — HM DIABETES EYE EXAM

## 2017-03-29 ENCOUNTER — Encounter: Payer: Self-pay | Admitting: Family Medicine

## 2017-03-31 ENCOUNTER — Encounter: Payer: Self-pay | Admitting: Family Medicine

## 2017-04-08 ENCOUNTER — Encounter: Payer: Self-pay | Admitting: Family Medicine

## 2017-04-10 ENCOUNTER — Telehealth: Payer: Self-pay | Admitting: Family Medicine

## 2017-04-10 DIAGNOSIS — N390 Urinary tract infection, site not specified: Secondary | ICD-10-CM | POA: Diagnosis not present

## 2017-04-10 NOTE — Telephone Encounter (Signed)
Noted. plz call tomorrow for an update.

## 2017-04-10 NOTE — Telephone Encounter (Signed)
Patient Name: JAIDEV SANGER DOB: 1931-04-12 Initial Comment Caller thinks husband has UTI, urine cloudy Nurse Assessment Nurse: Vallery Sa, RN, Cathy Date/Time (Eastern Time): 04/10/2017 1:34:30 PM Confirm and document reason for call. If symptomatic, describe symptoms. ---Caller states Hester developed pain with urination again about 2 days ago. No known fever. He was treated for an UTI about 2 weeks ago. Alert and responsive. Does the patient have any new or worsening symptoms? ---Yes Will a triage be completed? ---Yes Related visit to physician within the last 2 weeks? ---Yes Does the PT have any chronic conditions? (i.e. diabetes, asthma, etc.) ---Yes List chronic conditions. ---Dementia, Recent UTI, Diabetes, High Blood Pressure Is this a behavioral health or substance abuse call? ---No Guidelines Guideline Title Affirmed Question Affirmed Notes Urination Pain - Male Side (flank) or lower back pain present Final Disposition User See Physician within 4 Hours (or PCP triage) Vallery Sa, RN, Richardo Priest plans to take him to an urgent care facility near their house. Referrals GO TO FACILITY OTHER - SPECIFY Disagree/Comply: Comply

## 2017-04-11 NOTE — Telephone Encounter (Signed)
Spoke to wife and she said she did take pt to UC and they dx with UTI and prescribed him an abx and he is doing a little better today

## 2017-04-27 ENCOUNTER — Ambulatory Visit (INDEPENDENT_AMBULATORY_CARE_PROVIDER_SITE_OTHER): Payer: Medicare Other | Admitting: *Deleted

## 2017-04-27 DIAGNOSIS — I442 Atrioventricular block, complete: Secondary | ICD-10-CM

## 2017-04-28 LAB — CUP PACEART REMOTE DEVICE CHECK
Battery Remaining Longevity: 119 mo
Battery Voltage: 3.02 V
Brady Statistic AP VP Percent: 15 %
Brady Statistic AS VP Percent: 16 %
Brady Statistic RA Percent Paced: 27 %
Date Time Interrogation Session: 20180531094448
Implantable Lead Implant Date: 20151109
Implantable Lead Location: 753859
Implantable Lead Location: 753860
Implantable Pulse Generator Implant Date: 20151109
Lead Channel Impedance Value: 450 Ohm
Lead Channel Pacing Threshold Pulse Width: 0.4 ms
Lead Channel Sensing Intrinsic Amplitude: 12 mV
Lead Channel Sensing Intrinsic Amplitude: 5 mV
Lead Channel Setting Pacing Amplitude: 2 V
Lead Channel Setting Pacing Pulse Width: 0.4 ms
MDC IDC LEAD IMPLANT DT: 20151109
MDC IDC MSMT BATTERY REMAINING PERCENTAGE: 95.5 %
MDC IDC MSMT LEADCHNL RA IMPEDANCE VALUE: 400 Ohm
MDC IDC MSMT LEADCHNL RA PACING THRESHOLD AMPLITUDE: 1 V
MDC IDC MSMT LEADCHNL RV PACING THRESHOLD AMPLITUDE: 1 V
MDC IDC MSMT LEADCHNL RV PACING THRESHOLD PULSEWIDTH: 0.4 ms
MDC IDC SET LEADCHNL RV PACING AMPLITUDE: 2.5 V
MDC IDC SET LEADCHNL RV SENSING SENSITIVITY: 2 mV
MDC IDC STAT BRADY AP VS PERCENT: 15 %
MDC IDC STAT BRADY AS VS PERCENT: 51 %
MDC IDC STAT BRADY RV PERCENT PACED: 31 %
Pulse Gen Model: 2240
Pulse Gen Serial Number: 7666171

## 2017-04-28 NOTE — Progress Notes (Signed)
Remote pacemaker transmission.

## 2017-05-05 ENCOUNTER — Encounter: Payer: Self-pay | Admitting: Cardiology

## 2017-05-18 ENCOUNTER — Ambulatory Visit (INDEPENDENT_AMBULATORY_CARE_PROVIDER_SITE_OTHER): Payer: Medicare Other | Admitting: Cardiovascular Disease

## 2017-05-18 ENCOUNTER — Ambulatory Visit (INDEPENDENT_AMBULATORY_CARE_PROVIDER_SITE_OTHER)
Admission: RE | Admit: 2017-05-18 | Discharge: 2017-05-18 | Disposition: A | Discharge status: Home / Self Care | Payer: Medicare Other | Source: Ambulatory Visit | Attending: Cardiovascular Disease | Admitting: Cardiovascular Disease

## 2017-05-18 ENCOUNTER — Encounter: Payer: Self-pay | Admitting: Cardiovascular Disease

## 2017-05-18 VITALS — BP 110/64 | HR 71 | Ht 72.0 in | Wt 235.2 lb

## 2017-05-18 DIAGNOSIS — I10 Essential (primary) hypertension: Secondary | ICD-10-CM

## 2017-05-18 DIAGNOSIS — E7800 Pure hypercholesterolemia, unspecified: Secondary | ICD-10-CM | POA: Diagnosis not present

## 2017-05-18 DIAGNOSIS — I6200 Nontraumatic subdural hemorrhage, unspecified: Secondary | ICD-10-CM | POA: Diagnosis not present

## 2017-05-18 DIAGNOSIS — S065X9A Traumatic subdural hemorrhage with loss of consciousness of unspecified duration, initial encounter: Secondary | ICD-10-CM

## 2017-05-18 DIAGNOSIS — S065XAA Traumatic subdural hemorrhage with loss of consciousness status unknown, initial encounter: Secondary | ICD-10-CM

## 2017-05-18 DIAGNOSIS — I2510 Atherosclerotic heart disease of native coronary artery without angina pectoris: Secondary | ICD-10-CM

## 2017-05-18 DIAGNOSIS — I442 Atrioventricular block, complete: Secondary | ICD-10-CM
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
## 2017-05-18 NOTE — Patient Instructions (Addendum)
Medication Instructions:  Your physician recommends that you continue on your current medications as directed. Please refer to the Current Medication list given to you today.   Labwork: Your physician recommends that you return for lab work on June 26,2018.  This is fasting. The lab opens at 7:30   Testing/Procedures: Non-Cardiac CT scanning, (CAT scanning), is a noninvasive, special x-ray that produces cross-sectional images of the body using x-rays and a computer. CT scans help physicians diagnose and treat medical conditions. For some CT exams, a contrast material is used to enhance visibility in the area of the body being studied. CT scans provide greater clarity and reveal more details than regular x-ray exams.    Follow-Up: Your physician recommends that you schedule a follow-up appointment in: 12 months. Please call our office in about 9 months to schedule this appointment    Any Other Special Instructions Will Be Listed Below (If Applicable).     If you need a refill on your cardiac medications before your next appointment, please call your pharmacy.

## 2017-05-19 ENCOUNTER — Other Ambulatory Visit: Payer: Self-pay

## 2017-05-22 ENCOUNTER — Other Ambulatory Visit: Payer: Self-pay | Admitting: *Deleted

## 2017-05-22 MED ORDER — ASPIRIN EC 81 MG PO TBEC: 81.0000 mg | DELAYED_RELEASE_TABLET | Freq: Every day | ORAL | 3 refills | Status: DC

## 2017-05-23 ENCOUNTER — Other Ambulatory Visit: Payer: Medicare Other | Admitting: *Deleted

## 2017-05-23 DIAGNOSIS — I2510 Atherosclerotic heart disease of native coronary artery without angina pectoris: Secondary | ICD-10-CM | POA: Diagnosis not present

## 2017-05-23 DIAGNOSIS — E7800 Pure hypercholesterolemia, unspecified: Secondary | ICD-10-CM | POA: Diagnosis not present

## 2017-05-23 LAB — COMPREHENSIVE METABOLIC PANEL
A/G RATIO: 1 — AB (ref 1.2–2.2)
ALBUMIN: 3.7 g/dL (ref 3.5–4.7)
ALT: 11 IU/L (ref 0–44)
AST: 19 IU/L (ref 0–40)
Alkaline Phosphatase: 47 IU/L (ref 39–117)
BILIRUBIN TOTAL: 0.5 mg/dL (ref 0.0–1.2)
BUN / CREAT RATIO: 17 (ref 10–24)
BUN: 38 mg/dL — AB (ref 8–27)
CHLORIDE: 104 mmol/L (ref 96–106)
CO2: 21 mmol/L (ref 20–29)
Calcium: 8.7 mg/dL (ref 8.6–10.2)
Creatinine, Ser: 2.28 mg/dL — ABNORMAL HIGH (ref 0.76–1.27)
GFR calc non Af Amer: 25 mL/min/{1.73_m2} — ABNORMAL LOW (ref >59)
GFR, EST AFRICAN AMERICAN: 29 mL/min/{1.73_m2} — AB (ref >59)
GLOBULIN, TOTAL: 3.6 g/dL (ref 1.5–4.5)
GLUCOSE: 89 mg/dL (ref 65–99)
Potassium: 4.4 mmol/L (ref 3.5–5.2)
SODIUM: 142 mmol/L (ref 134–144)
TOTAL PROTEIN: 7.3 g/dL (ref 6.0–8.5)

## 2017-05-23 LAB — LIPID PANEL
CHOLESTEROL TOTAL: 223 mg/dL — AB (ref 100–199)
Chol/HDL Ratio: 6.4 ratio — ABNORMAL HIGH (ref 0.0–5.0)
HDL: 35 mg/dL — ABNORMAL LOW (ref >39)
LDL Calculated: 151 mg/dL — ABNORMAL HIGH (ref 0–99)
Triglycerides: 183 mg/dL — ABNORMAL HIGH (ref 0–149)
VLDL CHOLESTEROL CAL: 37 mg/dL (ref 5–40)

## 2017-05-25 ENCOUNTER — Other Ambulatory Visit: Payer: Self-pay | Admitting: *Deleted

## 2017-05-25 DIAGNOSIS — E7849 Other hyperlipidemia: Secondary | ICD-10-CM

## 2017-05-26 ENCOUNTER — Telehealth: Payer: Self-pay | Admitting: Podiatry

## 2017-05-26 NOTE — Telephone Encounter (Signed)
Pts wife called to check the status of his diabetic shoes.Please call pt with information

## 2017-05-28 DIAGNOSIS — D034 Melanoma in situ of scalp and neck: Secondary | ICD-10-CM

## 2017-05-28 HISTORY — DX: Melanoma in situ of scalp and neck: D03.4

## 2017-05-30 DIAGNOSIS — D1801 Hemangioma of skin and subcutaneous tissue: Secondary | ICD-10-CM | POA: Diagnosis not present

## 2017-05-30 DIAGNOSIS — L821 Other seborrheic keratosis: Secondary | ICD-10-CM | POA: Diagnosis not present

## 2017-05-30 DIAGNOSIS — Z85828 Personal history of other malignant neoplasm of skin: Secondary | ICD-10-CM | POA: Diagnosis not present

## 2017-05-30 DIAGNOSIS — L570 Actinic keratosis: Secondary | ICD-10-CM | POA: Diagnosis not present

## 2017-05-30 DIAGNOSIS — D225 Melanocytic nevi of trunk: Secondary | ICD-10-CM | POA: Diagnosis not present

## 2017-05-30 DIAGNOSIS — D034 Melanoma in situ of scalp and neck: Secondary | ICD-10-CM | POA: Diagnosis not present

## 2017-05-30 DIAGNOSIS — D692 Other nonthrombocytopenic purpura: Secondary | ICD-10-CM | POA: Diagnosis not present

## 2017-06-09 ENCOUNTER — Other Ambulatory Visit: Payer: Self-pay | Admitting: Family Medicine

## 2017-06-10 ENCOUNTER — Encounter: Payer: Self-pay | Admitting: Family Medicine

## 2017-06-13 DIAGNOSIS — D034 Melanoma in situ of scalp and neck: Secondary | ICD-10-CM | POA: Diagnosis not present

## 2017-06-13 DIAGNOSIS — Z85828 Personal history of other malignant neoplasm of skin: Secondary | ICD-10-CM | POA: Diagnosis not present

## 2017-06-19 ENCOUNTER — Ambulatory Visit (INDEPENDENT_AMBULATORY_CARE_PROVIDER_SITE_OTHER): Payer: Medicare Other | Admitting: Podiatry

## 2017-06-19 DIAGNOSIS — M79675 Pain in left toe(s): Secondary | ICD-10-CM | POA: Diagnosis not present

## 2017-06-19 DIAGNOSIS — E1149 Type 2 diabetes mellitus with other diabetic neurological complication: Secondary | ICD-10-CM | POA: Diagnosis not present

## 2017-06-19 DIAGNOSIS — M79674 Pain in right toe(s): Secondary | ICD-10-CM

## 2017-06-19 DIAGNOSIS — B351 Tinea unguium: Secondary | ICD-10-CM | POA: Diagnosis not present

## 2017-06-19 NOTE — Progress Notes (Signed)
Subjective: 83 y.o. returns the office today for painful, elongated, thickened toenails which he cannot trim himself. Denies any redness or drainage around the nails. Denies any acute changes since last appointment and no new complaints today. Denies any systemic complaints such as fevers, chills, nausea, vomiting.   Also presents today to get measured for diabetic shoes.   Objective: AAO 3, NAD DP/PT pulses palpable, CRT less than 3 seconds Protective sensation decreased with Simms Weinstein monofilament, Achilles tendon reflex intact.  Nails hypertrophic, dystrophic, elongated, brittle, discolored 10. There is tenderness overlying the nails 1-5 bilaterally. There is no surrounding erythema or drainage along the nail sites. No open lesions or pre-ulcerative lesions are identified. No other areas of tenderness bilateral lower extremities. No overlying edema, erythema, increased warmth. No pain with calf compression, swelling, warmth, erythema.  Assessment: Patient presents with symptomatic onychomycosis  Plan: -Treatment options including alternatives, risks, complications were discussed -Nails sharply debrided 10 without complication/bleeding. -Patient again measured stay for diabetic shoes. Given decreased sensation, digital deformity I recommended diabetic shoes. -Discussed daily foot inspection. If there are any changes, to call the office immediately.  -Follow-up in 3 months or sooner if any problems are to arise. In the meantime, encouraged to call the office with any questions, concerns, changes symptoms.  Celesta Gentile, DPM

## 2017-07-10 ENCOUNTER — Ambulatory Visit (INDEPENDENT_AMBULATORY_CARE_PROVIDER_SITE_OTHER): Payer: Medicare Other | Admitting: Family Medicine

## 2017-07-10 ENCOUNTER — Encounter: Payer: Self-pay | Admitting: Family Medicine

## 2017-07-10 VITALS — BP 130/70 | HR 87 | Temp 97.8°F | Wt 232.5 lb

## 2017-07-10 DIAGNOSIS — E7800 Pure hypercholesterolemia, unspecified: Secondary | ICD-10-CM | POA: Diagnosis not present

## 2017-07-10 DIAGNOSIS — E1122 Type 2 diabetes mellitus with diabetic chronic kidney disease: Secondary | ICD-10-CM

## 2017-07-10 DIAGNOSIS — N183 Chronic kidney disease, stage 3 unspecified: Secondary | ICD-10-CM

## 2017-07-10 DIAGNOSIS — I2510 Atherosclerotic heart disease of native coronary artery without angina pectoris: Secondary | ICD-10-CM

## 2017-07-10 DIAGNOSIS — R413 Other amnesia: Secondary | ICD-10-CM | POA: Diagnosis not present

## 2017-07-10 DIAGNOSIS — I5022 Chronic systolic (congestive) heart failure: Secondary | ICD-10-CM | POA: Diagnosis not present

## 2017-07-10 DIAGNOSIS — R269 Unspecified abnormalities of gait and mobility: Secondary | ICD-10-CM

## 2017-07-10 DIAGNOSIS — E1140 Type 2 diabetes mellitus with diabetic neuropathy, unspecified: Secondary | ICD-10-CM | POA: Diagnosis not present

## 2017-07-10 LAB — CBC WITH DIFFERENTIAL/PLATELET
BASOS PCT: 0.2 % (ref 0.0–3.0)
Basophils Absolute: 0 10*3/uL (ref 0.0–0.1)
EOS PCT: 1 % (ref 0.0–5.0)
Eosinophils Absolute: 0.1 10*3/uL (ref 0.0–0.7)
HCT: 45.6 % (ref 39.0–52.0)
Hemoglobin: 15.4 g/dL (ref 13.0–17.0)
LYMPHS ABS: 3.8 10*3/uL (ref 0.7–4.0)
Lymphocytes Relative: 38.6 % (ref 12.0–46.0)
MCHC: 33.8 g/dL (ref 30.0–36.0)
MCV: 90.3 fl (ref 78.0–100.0)
MONO ABS: 0.7 10*3/uL (ref 0.1–1.0)
MONOS PCT: 7.5 % (ref 3.0–12.0)
NEUTROS ABS: 5.2 10*3/uL (ref 1.4–7.7)
NEUTROS PCT: 52.7 % (ref 43.0–77.0)
PLATELETS: 153 10*3/uL (ref 150.0–400.0)
RBC: 5.05 Mil/uL (ref 4.22–5.81)
RDW: 15.3 % (ref 11.5–15.5)
WBC: 9.9 10*3/uL (ref 4.0–10.5)

## 2017-07-10 LAB — RENAL FUNCTION PANEL
ALBUMIN: 3.8 g/dL (ref 3.5–5.2)
BUN: 36 mg/dL — ABNORMAL HIGH (ref 6–23)
CALCIUM: 9 mg/dL (ref 8.4–10.5)
CO2: 23 meq/L (ref 19–32)
Chloride: 107 mEq/L (ref 96–112)
Creatinine, Ser: 2.12 mg/dL — ABNORMAL HIGH (ref 0.40–1.50)
GFR: 31.75 mL/min — ABNORMAL LOW (ref >60.00)
GLUCOSE: 115 mg/dL — AB (ref 70–99)
POTASSIUM: 4.2 meq/L (ref 3.5–5.1)
Phosphorus: 3.3 mg/dL (ref 2.3–4.6)
Sodium: 139 mEq/L (ref 135–145)

## 2017-07-10 LAB — HEMOGLOBIN A1C: HEMOGLOBIN A1C: 7 % — AB (ref 4.6–6.5)

## 2017-07-10 MED ORDER — ROSUVASTATIN CALCIUM 20 MG PO TABS: 20.0000 mg | ORAL_TABLET | Freq: Every day | ORAL | 0 refills | Status: DC

## 2017-07-10 NOTE — Assessment & Plan Note (Signed)
Update labs.

## 2017-07-10 NOTE — Assessment & Plan Note (Signed)
Stable period. Did not tolerate aricept - ?paranoid thoughts

## 2017-07-10 NOTE — Addendum Note (Signed)
Addended by: Marchia Bond on: 07/10/2017 02:24 PM   Modules accepted: Orders

## 2017-07-10 NOTE — Assessment & Plan Note (Addendum)
Very unsteady on his feet despite walker.  He has completed HHPT (last 12/2016).

## 2017-07-10 NOTE — Progress Notes (Signed)
BP 130/70   Pulse 87   Temp 97.8 F (36.6 C) (Oral)   Wt 232 lb 8 oz (105.5 kg)   SpO2 94%   BMI 31.53 kg/m    CC: 4 mo f/u visit Subjective:    Patient ID: Frank Simpson, male    DOB: February 18, 1931, 83 y.o.   MRN: [id number]  HPI: Frank Simpson is a 83 y.o. male presenting on 07/10/2017 for Follow-up   Liquid dysphagia - referred for MBS - laryngeal penetration and aspiration with thin liquids - rec throat clearing after every swallow. Pt declined outpatient ST.  DM - regularly does check sugars and brings log. Compliant with antihyperglycemic regimen which includes: basaglar 50u QAM, humalog SSI.  Denies low sugars or hypoglycemic symptoms.  Denies paresthesias. Last diabetic eye exam 03/2017.  Pneumovax: 2010.  Prevnar: 2015. Lab Results  Component Value Date   HGBA1C 7.6 (H) 03/07/2017   Diabetic Foot Exam - Simple   No data filed       More pale today, more dyspneic with exertion and at rest. More unsteady. Uses walker regularly.   Sliding scale insulin: 4 units with breakfast 8 units with lunch and dinner <150: no extra insulin 150-200: 2 units 201-250: 4 units 251-300: 8 units  351-400: 10 units >400: 12 units Basaglar 50u daily in am.   Brings glucose log which was reviewed and will be scanned. No low sugars. PM elevated to 300, otherwise well controlled. Fasting am 80-100s. Pre-lunch 160-220. Pre-dinner 200-250  Requests refill of crestor to [facility].  Relevant past medical, surgical, family and social history reviewed and updated as indicated. Interim medical history since our last visit reviewed. Allergies and medications reviewed and updated. Outpatient Medications Prior to Visit  Medication Sig Dispense Refill  . acetaminophen (TYLENOL) 500 MG tablet Take 500 mg by mouth every 6 (six) hours as needed (pain).     [name] Kitchen amLODipine (NORVASC) 5 MG tablet Take 1 tablet (5 mg total) by mouth daily. 90 tablet 2  . aspirin EC 81 MG tablet Take 1 tablet (81 mg  total) by mouth daily. 90 tablet 3  . B-D ULTRAFINE III SHORT PEN 31G X 8 MM MISC USE AS DIRECTED WITH LANTUS SOLOSTAR. DX E11.40. 100 each 1  . HUMALOG KWIKPEN 100 UNIT/ML KiwkPen INJECT 4 UNITS WITH        BREAKFAST, 8 UNITS WITH    LUNCH/DINNER PLUS SLIDING  SCALE (NEW DIRECTIONS) 30 mL 3  . Insulin Glargine (BASAGLAR KWIKPEN) 100 UNIT/ML SOPN Inject 0.5 mLs (50 Units total) into the skin daily. 15 pen 3  . isosorbide mononitrate (IMDUR) 30 MG 24 hr tablet Take 1 tablet (30 mg total) by mouth daily. 90 tablet 2  . Naphazoline HCl (CLEAR EYES OP) Place 1 drop into both eyes 3 (three) times daily.    . ONE TOUCH ULTRA TEST test strip USE TO CHECK SUGAR 4 TIMES DAILY. DX: E11.40 200 each 5  . sertraline (ZOLOFT) 25 MG tablet Take 0.5 tablets (25 mg total) by mouth daily.    . tamsulosin (FLOMAX) 0.4 MG CAPS capsule Take 1 capsule (0.4 mg total) by mouth daily. 90 capsule 1  . cephALEXin (KEFLEX) 500 MG capsule Take 1 capsule (500 mg total) by mouth 2 (two) times daily. 14 capsule 0  . rosuvastatin (CRESTOR) 20 MG tablet Take 1 tablet (20 mg total) by mouth daily. 90 tablet 3  . nitroGLYCERIN (NITROSTAT) 0.4 MG SL tablet Place 1 tablet (0.4 mg total)  under the tongue every 5 (five) minutes as needed for chest pain. Up to three doses,if pain continues call 911 25 tablet 3   No facility-administered medications prior to visit.      Per HPI unless specifically indicated in ROS section below Review of Systems     Objective:    BP 130/70   Pulse 87   Temp 97.8 F (36.6 C) (Oral)   Wt 232 lb 8 oz (105.5 kg)   SpO2 94%   BMI 31.53 kg/m   Wt Readings from Last 3 Encounters:  07/10/17 232 lb 8 oz (105.5 kg)  05/18/17 235 lb 3.2 oz (106.7 kg)  03/17/17 239 lb 12 oz (108.7 kg)    Physical Exam  Constitutional: He appears well-developed and well-nourished. No distress.  Uses walker  Very unsteady on feet  HENT:  Mouth/Throat: Oropharynx is clear and moist. No oropharyngeal exudate.    Cardiovascular: Normal rate, regular rhythm, normal heart sounds and intact distal pulses.   No murmur heard. Pulmonary/Chest: Effort normal and breath sounds normal. No respiratory distress. He has no wheezes. He has no rales.  Musculoskeletal: He exhibits edema (1+ pitting).  Neurological:  Unsteady gait with walker  Skin: There is pallor.  Nursing note and vitals reviewed.  Results for orders placed or performed in visit on 05/23/17  Comp Met (CMET)  Result Value Ref Range   Glucose 89 65 - 99 mg/dL   BUN 38 (H) 8 - 27 mg/dL   Creatinine, Ser 2.28 (H) 0.76 - 1.27 mg/dL   GFR calc non Af Amer 25 (L) >59 mL/min/1.73   GFR calc Af Amer 29 (L) >59 mL/min/1.73   BUN/Creatinine Ratio 17 10 - 24   Sodium 142 134 - 144 mmol/L   Potassium 4.4 3.5 - 5.2 mmol/L   Chloride 104 96 - 106 mmol/L   CO2 21 20 - 29 mmol/L   Calcium 8.7 8.6 - 10.2 mg/dL   Total Protein 7.3 6.0 - 8.5 g/dL   Albumin 3.7 3.5 - 4.7 g/dL   Globulin, Total 3.6 1.5 - 4.5 g/dL   Albumin/Globulin Ratio 1.0 (L) 1.2 - 2.2   Bilirubin Total 0.5 0.0 - 1.2 mg/dL   Alkaline Phosphatase 47 39 - 117 IU/L   AST 19 0 - 40 IU/L   ALT 11 0 - 44 IU/L  Lipid Profile  Result Value Ref Range   Cholesterol, Total 223 (H) 100 - 199 mg/dL   Triglycerides 183 (H) 0 - 149 mg/dL   HDL 35 (L) >39 mg/dL   VLDL Cholesterol Cal 37 5 - 40 mg/dL   LDL Calculated 151 (H) 0 - 99 mg/dL   Chol/HDL Ratio 6.4 (H) 0.0 - 5.0 ratio      Assessment & Plan:   Problem List Items Addressed This Visit    Chronic systolic heart failure (HCC) - Primary   Relevant Medications   rosuvastatin (CRESTOR) 20 MG tablet   CKD stage 3 due to type 2 diabetes mellitus ([place])    Update labs.       Relevant Medications   rosuvastatin (CRESTOR) 20 MG tablet   Other Relevant Orders   Renal function panel   CBC with Differential/Platelet   Gait disorder    Very unsteady on his feet despite walker.  He has completed HHPT (last 12/2016).        Hypercholesterolemia    Back on crestor 76m daily.      Relevant Medications   rosuvastatin (CRESTOR) 20 MG  tablet   Memory loss    Stable period. Did not tolerate aricept - ?paranoid thoughts      Type 2 diabetes, controlled, with neuropathy (HCC)    Chronic, stable on current insulin regimen. Update labs.       Relevant Medications   rosuvastatin (CRESTOR) 20 MG tablet   Other Relevant Orders   Hemoglobin A1c   Microalbumin / creatinine urine ratio       Follow up plan: Return in about 4 months (around 11/09/2017) for annual exam, prior fasting for blood work, medicare wellness visit.  [name], MD

## 2017-07-10 NOTE — Assessment & Plan Note (Signed)
Back on crestor 76m daily.

## 2017-07-10 NOTE — Assessment & Plan Note (Signed)
Chronic, stable on current insulin regimen. Update labs.

## 2017-07-10 NOTE — Patient Instructions (Addendum)
Labs today. Urine for protein today. Return in 4 months for medicare wellness visit with Katha Cabal and follow up with me.

## 2017-07-12 ENCOUNTER — Encounter: Payer: Self-pay | Admitting: *Deleted

## 2017-07-17 ENCOUNTER — Ambulatory Visit (INDEPENDENT_AMBULATORY_CARE_PROVIDER_SITE_OTHER): Payer: Medicare Other | Admitting: Orthotics

## 2017-07-17 DIAGNOSIS — M2041 Other hammer toe(s) (acquired), right foot: Secondary | ICD-10-CM

## 2017-07-17 DIAGNOSIS — M2042 Other hammer toe(s) (acquired), left foot: Secondary | ICD-10-CM | POA: Diagnosis not present

## 2017-07-17 DIAGNOSIS — M79674 Pain in right toe(s): Secondary | ICD-10-CM

## 2017-07-17 DIAGNOSIS — E1149 Type 2 diabetes mellitus with other diabetic neurological complication: Secondary | ICD-10-CM

## 2017-07-17 DIAGNOSIS — M79676 Pain in unspecified toe(s): Secondary | ICD-10-CM

## 2017-07-17 DIAGNOSIS — M79675 Pain in left toe(s): Principal | ICD-10-CM

## 2017-07-17 NOTE — Progress Notes (Signed)
Patient came in today to pick up diabetic shoes and custom inserts.  Same was well pleased with fit and function.   The patient could ambulate without any discomfort; there were no signs of any quality issues. The foot ortheses offered full contact with plantar surface and contoured the arch well.   The shoes fit well with no heel slippage and areas of pressure concern.   Patient advised to contact us if any problems arise.  Patient also advised on how to report any issues.

## 2017-07-26 ENCOUNTER — Telehealth: Payer: Self-pay

## 2017-07-26 MED ORDER — ROSUVASTATIN CALCIUM 20 MG PO TABS: 20.0000 mg | ORAL_TABLET | Freq: Every day | ORAL | 0 refills | Status: DC

## 2017-07-26 NOTE — Telephone Encounter (Signed)
[facility] said that Crestor rx was lost in transit and request another order to resend Crestor to pt. Advised OK to send another order to pt. Nothing further needed.

## 2017-07-27 ENCOUNTER — Ambulatory Visit (INDEPENDENT_AMBULATORY_CARE_PROVIDER_SITE_OTHER): Payer: Medicare Other | Admitting: *Deleted

## 2017-07-27 DIAGNOSIS — I442 Atrioventricular block, complete: Secondary | ICD-10-CM

## 2017-07-27 NOTE — Progress Notes (Signed)
Remote pacemaker transmission.

## 2017-08-08 ENCOUNTER — Encounter: Payer: Self-pay | Admitting: Cardiology

## 2017-08-22 ENCOUNTER — Other Ambulatory Visit: Payer: Medicare Other

## 2017-08-22 DIAGNOSIS — E784 Other hyperlipidemia: Secondary | ICD-10-CM | POA: Diagnosis not present

## 2017-08-22 DIAGNOSIS — E7849 Other hyperlipidemia: Secondary | ICD-10-CM

## 2017-08-22 LAB — LIPID PANEL
CHOL/HDL RATIO: 3.1 ratio (ref 0.0–5.0)
Cholesterol, Total: 115 mg/dL (ref 100–199)
HDL: 37 mg/dL — ABNORMAL LOW (ref >39)
LDL CALC: 53 mg/dL (ref 0–99)
TRIGLYCERIDES: 127 mg/dL (ref 0–149)
VLDL Cholesterol Cal: 25 mg/dL (ref 5–40)

## 2017-08-22 LAB — CUP PACEART REMOTE DEVICE CHECK
Date Time Interrogation Session: 20180925081906
Implantable Lead Implant Date: 20151109
Implantable Lead Implant Date: 20151109
Implantable Lead Location: 753859
MDC IDC LEAD LOCATION: 753860
MDC IDC PG IMPLANT DT: 20151109
Pulse Gen Serial Number: 7666171

## 2017-08-22 LAB — HEPATIC FUNCTION PANEL
ALBUMIN: 3.8 g/dL (ref 3.5–4.7)
ALT: 9 IU/L (ref 0–44)
AST: 18 IU/L (ref 0–40)
Alkaline Phosphatase: 41 IU/L (ref 39–117)
BILIRUBIN TOTAL: 0.6 mg/dL (ref 0.0–1.2)
BILIRUBIN, DIRECT: 0.16 mg/dL (ref 0.00–0.40)
Total Protein: 7.1 g/dL (ref 6.0–8.5)

## 2017-08-30 ENCOUNTER — Other Ambulatory Visit: Payer: Self-pay | Admitting: Cardiovascular Disease

## 2017-09-04 ENCOUNTER — Other Ambulatory Visit: Payer: Self-pay | Admitting: *Deleted

## 2017-09-04 MED ORDER — AMLODIPINE BESYLATE 5 MG PO TABS: 5.0000 mg | ORAL_TABLET | Freq: Every day | ORAL | 2 refills | Status: DC

## 2017-09-05 ENCOUNTER — Other Ambulatory Visit: Payer: Self-pay | Admitting: Family Medicine

## 2017-09-05 ENCOUNTER — Other Ambulatory Visit: Payer: Self-pay

## 2017-09-05 MED ORDER — ISOSORBIDE MONONITRATE ER 30 MG PO TB24: 30.0000 mg | ORAL_TABLET | Freq: Every day | ORAL | 0 refills | Status: DC

## 2017-09-14 ENCOUNTER — Ambulatory Visit (INDEPENDENT_AMBULATORY_CARE_PROVIDER_SITE_OTHER): Payer: Medicare Other

## 2017-09-14 DIAGNOSIS — Z23 Encounter for immunization: Secondary | ICD-10-CM | POA: Diagnosis not present

## 2017-09-18 ENCOUNTER — Ambulatory Visit (INDEPENDENT_AMBULATORY_CARE_PROVIDER_SITE_OTHER): Payer: Medicare Other | Admitting: Podiatry

## 2017-09-18 ENCOUNTER — Encounter: Payer: Self-pay | Admitting: Podiatry

## 2017-09-18 DIAGNOSIS — M79675 Pain in left toe(s): Secondary | ICD-10-CM

## 2017-09-18 DIAGNOSIS — M79674 Pain in right toe(s): Secondary | ICD-10-CM | POA: Diagnosis not present

## 2017-09-18 DIAGNOSIS — B351 Tinea unguium: Secondary | ICD-10-CM | POA: Diagnosis not present

## 2017-09-20 NOTE — Progress Notes (Signed)
Subjective: 83 y.o. returns the office today for painful, elongated, thickened toenails which he cannot trim himself. Denies any redness or drainage around the nails. Denies any acute changes since last appointment and no new complaints today. Denies any systemic complaints such as fevers, chills, nausea, vomiting.   He states his diabetic shoes are fitting well  His blood sugar this morning was 90. Last A1c was 7  PCP: [name], MD Last seen: 07/10/2017  Objective: AAO 3, NAD DP/PT pulses palpable, CRT less than 3 seconds Protective sensation decreased with Simms Weinstein monofilament, Achilles tendon reflex intact.  Nails hypertrophic, dystrophic, elongated, brittle, discolored 10. There is tenderness overlying the nails 1-5 bilaterally. There is no surrounding erythema or drainage along the nail sites. No open lesions or pre-ulcerative lesions are identified. No other areas of tenderness bilateral lower extremities. No overlying edema, erythema, increased warmth. No pain with calf compression, swelling, warmth, erythema.  Assessment: Patient presents with symptomatic onychomycosis  Plan: -Treatment options including alternatives, risks, complications were discussed -Nails sharply debrided 10 without complication/bleeding. -Patient again measured stay for diabetic shoes. Given decreased sensation, digital deformity I recommended diabetic shoes. -Discussed daily foot inspection. If there are any changes, to call the office immediately.  -Follow-up in 3 months or sooner if any problems are to arise. In the meantime, encouraged to call the office with any questions, concerns, changes symptoms.  Celesta Gentile, DPM

## 2017-10-26 ENCOUNTER — Ambulatory Visit (INDEPENDENT_AMBULATORY_CARE_PROVIDER_SITE_OTHER): Payer: Medicare Other | Admitting: *Deleted

## 2017-10-26 DIAGNOSIS — I442 Atrioventricular block, complete: Secondary | ICD-10-CM | POA: Diagnosis not present

## 2017-10-26 NOTE — Progress Notes (Signed)
Remote pacemaker transmission.

## 2017-10-27 ENCOUNTER — Encounter: Payer: Self-pay | Admitting: Cardiology

## 2017-10-31 ENCOUNTER — Other Ambulatory Visit: Payer: Self-pay | Admitting: Family Medicine

## 2017-10-31 DIAGNOSIS — D696 Thrombocytopenia, unspecified: Secondary | ICD-10-CM

## 2017-10-31 DIAGNOSIS — E7800 Pure hypercholesterolemia, unspecified: Secondary | ICD-10-CM

## 2017-10-31 DIAGNOSIS — E1122 Type 2 diabetes mellitus with diabetic chronic kidney disease: Secondary | ICD-10-CM

## 2017-10-31 DIAGNOSIS — E1140 Type 2 diabetes mellitus with diabetic neuropathy, unspecified: Secondary | ICD-10-CM

## 2017-10-31 DIAGNOSIS — N183 Chronic kidney disease, stage 3 (moderate): Secondary | ICD-10-CM

## 2017-11-01 ENCOUNTER — Ambulatory Visit: Payer: Self-pay

## 2017-11-01 ENCOUNTER — Other Ambulatory Visit (INDEPENDENT_AMBULATORY_CARE_PROVIDER_SITE_OTHER): Payer: Medicare Other

## 2017-11-01 DIAGNOSIS — E1140 Type 2 diabetes mellitus with diabetic neuropathy, unspecified: Secondary | ICD-10-CM | POA: Diagnosis not present

## 2017-11-01 DIAGNOSIS — N183 Chronic kidney disease, stage 3 (moderate): Secondary | ICD-10-CM

## 2017-11-01 DIAGNOSIS — E1122 Type 2 diabetes mellitus with diabetic chronic kidney disease: Secondary | ICD-10-CM

## 2017-11-01 LAB — RENAL FUNCTION PANEL
Albumin: 3.8 g/dL (ref 3.5–5.2)
BUN: 26 mg/dL — AB (ref 6–23)
CO2: 26 mEq/L (ref 19–32)
CREATININE: 1.91 mg/dL — AB (ref 0.40–1.50)
Calcium: 8.8 mg/dL (ref 8.4–10.5)
Chloride: 107 mEq/L (ref 96–112)
GFR: 35.78 mL/min — AB (ref >60.00)
GLUCOSE: 88 mg/dL (ref 70–99)
PHOSPHORUS: 3.3 mg/dL (ref 2.3–4.6)
Potassium: 4 mEq/L (ref 3.5–5.1)
SODIUM: 141 meq/L (ref 135–145)

## 2017-11-01 LAB — MICROALBUMIN / CREATININE URINE RATIO
CREATININE, U: 160.2 mg/dL
MICROALB UR: 35.9 mg/dL — AB (ref 0.0–1.9)
Microalb Creat Ratio: 22.4 mg/g (ref 0.0–30.0)

## 2017-11-01 LAB — HEMOGLOBIN A1C: HEMOGLOBIN A1C: 7.6 % — AB (ref 4.6–6.5)

## 2017-11-07 ENCOUNTER — Ambulatory Visit: Payer: Self-pay

## 2017-11-07 LAB — CUP PACEART REMOTE DEVICE CHECK
Battery Remaining Longevity: 117 mo
Battery Remaining Percentage: 95.5 %
Battery Voltage: 3.01 V
Brady Statistic AP VS Percent: 12 %
Brady Statistic AS VS Percent: 43 %
Implantable Lead Implant Date: 20151109
Implantable Pulse Generator Implant Date: 20151109
Lead Channel Impedance Value: 400 Ohm
Lead Channel Pacing Threshold Amplitude: 1 V
Lead Channel Pacing Threshold Pulse Width: 0.4 ms
Lead Channel Sensing Intrinsic Amplitude: 3.5 mV
Lead Channel Setting Pacing Amplitude: 2.5 V
Lead Channel Setting Sensing Sensitivity: 2 mV
MDC IDC LEAD IMPLANT DT: 20151109
MDC IDC LEAD LOCATION: 753859
MDC IDC LEAD LOCATION: 753860
MDC IDC MSMT LEADCHNL RA PACING THRESHOLD PULSEWIDTH: 0.4 ms
MDC IDC MSMT LEADCHNL RV IMPEDANCE VALUE: 450 Ohm
MDC IDC MSMT LEADCHNL RV PACING THRESHOLD AMPLITUDE: 1 V
MDC IDC MSMT LEADCHNL RV SENSING INTR AMPL: 11.2 mV
MDC IDC PG SERIAL: 7666171
MDC IDC SESS DTM: 20181129090012
MDC IDC SET LEADCHNL RA PACING AMPLITUDE: 2 V
MDC IDC SET LEADCHNL RV PACING PULSEWIDTH: 0.4 ms
MDC IDC STAT BRADY AP VP PERCENT: 18 %
MDC IDC STAT BRADY AS VP PERCENT: 25 %
MDC IDC STAT BRADY RA PERCENT PACED: 27 %
MDC IDC STAT BRADY RV PERCENT PACED: 43 %

## 2017-11-08 ENCOUNTER — Ambulatory Visit (INDEPENDENT_AMBULATORY_CARE_PROVIDER_SITE_OTHER): Payer: Medicare Other | Admitting: Family Medicine

## 2017-11-08 ENCOUNTER — Encounter: Payer: Self-pay | Admitting: Family Medicine

## 2017-11-08 VITALS — BP 116/64 | HR 70 | Temp 97.8°F | Ht 70.0 in | Wt 225.0 lb

## 2017-11-08 DIAGNOSIS — N183 Chronic kidney disease, stage 3 (moderate): Secondary | ICD-10-CM | POA: Diagnosis not present

## 2017-11-08 DIAGNOSIS — R296 Repeated falls: Secondary | ICD-10-CM

## 2017-11-08 DIAGNOSIS — E113293 Type 2 diabetes mellitus with mild nonproliferative diabetic retinopathy without macular edema, bilateral: Secondary | ICD-10-CM

## 2017-11-08 DIAGNOSIS — R269 Unspecified abnormalities of gait and mobility: Secondary | ICD-10-CM | POA: Diagnosis not present

## 2017-11-08 DIAGNOSIS — E1122 Type 2 diabetes mellitus with diabetic chronic kidney disease: Secondary | ICD-10-CM

## 2017-11-08 DIAGNOSIS — Z794 Long term (current) use of insulin: Secondary | ICD-10-CM | POA: Diagnosis not present

## 2017-11-08 DIAGNOSIS — I2510 Atherosclerotic heart disease of native coronary artery without angina pectoris: Secondary | ICD-10-CM

## 2017-11-08 DIAGNOSIS — S065X9A Traumatic subdural hemorrhage with loss of consciousness of unspecified duration, initial encounter: Secondary | ICD-10-CM

## 2017-11-08 DIAGNOSIS — G4489 Other headache syndrome: Secondary | ICD-10-CM

## 2017-11-08 DIAGNOSIS — E1140 Type 2 diabetes mellitus with diabetic neuropathy, unspecified: Secondary | ICD-10-CM

## 2017-11-08 DIAGNOSIS — R51 Headache with orthostatic component, not elsewhere classified: Secondary | ICD-10-CM

## 2017-11-08 DIAGNOSIS — R55 Syncope and collapse: Secondary | ICD-10-CM | POA: Diagnosis not present

## 2017-11-08 DIAGNOSIS — R519 Headache, unspecified: Secondary | ICD-10-CM

## 2017-11-08 DIAGNOSIS — Z950 Presence of cardiac pacemaker: Secondary | ICD-10-CM | POA: Diagnosis not present

## 2017-11-08 DIAGNOSIS — S065XAA Traumatic subdural hemorrhage with loss of consciousness status unknown, initial encounter: Secondary | ICD-10-CM

## 2017-11-08 MED ORDER — BASAGLAR KWIKPEN 100 UNIT/ML ~~LOC~~ SOPN: 50.0000 [IU] | PEN_INJECTOR | Freq: Every day | SUBCUTANEOUS | 3 refills | Status: DC

## 2017-11-08 MED ORDER — TAMSULOSIN HCL 0.4 MG PO CAPS: 0.4000 mg | ORAL_CAPSULE | Freq: Every day | ORAL | 3 refills | Status: DC

## 2017-11-08 MED ORDER — INSULIN PEN NEEDLE 31G X 8 MM MISC: 1 refills | Status: DC

## 2017-11-08 MED ORDER — GLUCOSE BLOOD VI STRP: ORAL_STRIP | 5 refills | Status: DC

## 2017-11-08 MED ORDER — ISOSORBIDE MONONITRATE ER 30 MG PO TB24: 30.0000 mg | ORAL_TABLET | Freq: Every day | ORAL | 3 refills | Status: DC

## 2017-11-08 MED ORDER — INSULIN LISPRO 100 UNIT/ML (KWIKPEN): PEN_INJECTOR | SUBCUTANEOUS | 3 refills | Status: DC

## 2017-11-08 MED ORDER — SERTRALINE HCL 25 MG PO TABS: 25.0000 mg | ORAL_TABLET | Freq: Every day | ORAL | 3 refills | Status: DC

## 2017-11-08 MED ORDER — ACETAMINOPHEN 500 MG PO TABS: 500.0000 mg | ORAL_TABLET | Freq: Four times a day (QID) | ORAL | 6 refills | Status: DC | PRN

## 2017-11-08 NOTE — Patient Instructions (Addendum)
Consider MRI with contrast to evaluate upright headaches.  Continue current medicines.  Sugars are doing ok.  Good to see you today, return in 4 months for follow up visit.

## 2017-11-08 NOTE — Assessment & Plan Note (Addendum)
H/o this 10/2016 but seemed to have resolved on latest imaging 04/2017, now with recurrence of headaches.

## 2017-11-08 NOTE — Progress Notes (Addendum)
BP 116/64 (BP Location: Right Arm, Patient Position: Sitting, Cuff Size: Large)   Pulse 70   Temp 97.8 F (36.6 C) (Oral)   Ht 5' 10" (1.778 m)   Wt 225 lb (102.1 kg)   SpO2 96%   BMI 32.28 kg/m    CC: 56mof/u visit - AMW rescheduled to next week Subjective:    Patient ID: Frank Simpson male    DOB: 1Apr 22, 1932 83y.o.   MRN: [id number] HPI: Frank Simpson a 83y.o. male presenting on 11/08/2017 for Annual Exam (Pt 2. Has appt with LKatha Cabal12/18/18. Provided recent BP and BS readings)   Appt with Lesia rescheduled for next week.  Over the last few months endorses headaches when upright that improve when supine. This happens daily and he does go lay down regularly to help treat them. H/o fall with traumatic brain injury 09/2014, rpt fall with subdural hematoma 10/2016 that had resolved on latest imaging. Pt/wife deny h/o LP. 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 Ongoing falls at home without injury. Had previously declined outpatient therapy.  Dementia - did not tolerate Aricept due to increase in paranoid thoughts. Last saw neurology Dr YKrista Blue2014, did not f/u. At that time ?central degenerative process vs vascular dementia.   DM - does regularly check sugars and brings log which was reviewed. AM sugars 70-90s, pre lunch 130-190s, dinner 200s. Compliant with antihyperglycemic regimen which includes: basaglar 50u QAM, humalog SSI. Denies low sugars or hypoglycemic symptoms. Denies paresthesias. Last diabetic eye exam 03/2017. Pneumovax: 2010. Prevnar: 2015. Glucometer brand: one touch. DSME: completed 2012. Lab Results  Component Value Date   HGBA1C 7.6 (H) 11/01/2017   Diabetic Foot Exam - Simple   No data filed     Lab Results  Component Value Date   MICROALBUR 35.9 (H) 11/01/2017    Sliding  scale insulin: 4 units with breakfast 8 units with lunch and dinner PLUS <150: no extra insulin 150-200: 2 units 201-250: 4 units 251-300: 8 units  351-400: 10 units >400: 12 units Basaglar 50u daily in am.   Relevant past medical, surgical, family and social history reviewed and updated as indicated. Interim medical history since our last visit reviewed. Allergies and medications reviewed and updated. Outpatient Medications Prior to Visit  Medication Sig Dispense Refill  . amLODipine (NORVASC) 5 MG tablet Take 1 tablet (5 mg total) by mouth daily. 90 tablet 2  . aspirin EC 81 MG tablet Take 1 tablet (81 mg total) by mouth daily. 90 tablet 3  . Naphazoline HCl (CLEAR EYES OP) Place 1 drop into both eyes 3 (three) times daily.    . rosuvastatin (CRESTOR) 20 MG tablet Take 1 tablet (20 mg total) by mouth daily. 90 tablet 0  . acetaminophen (TYLENOL) 500 MG tablet Take 500 mg by mouth every 6 (six) hours as needed (pain).     . B-D ULTRAFINE III SHORT PEN 31G X 8 MM MISC USE AS DIRECTED WITH LANTUS SOLOSTAR. DX E11.40. 100 each 1  . HUMALOG KWIKPEN 100 UNIT/ML KiwkPen INJECT 4 UNITS WITH        BREAKFAST, 8 UNITS WITH    LUNCH/DINNER PLUS SLIDING  SCALE (NEW DIRECTIONS) 30 mL 3  . Insulin Glargine (BASAGLAR KWIKPEN) 100 UNIT/ML SOPN Inject 0.5 mLs (50  Units total) into the skin daily. 15 pen 3  . isosorbide mononitrate (IMDUR) 30 MG 24 hr tablet Take 1 tablet (30 mg total) by mouth daily. 90 tablet 0  . ONE TOUCH ULTRA TEST test strip USE TO CHECK SUGAR 4 TIMES DAILY. DX: E11.40 200 each 5  . sertraline (ZOLOFT) 25 MG tablet Take 0.5 tablets (25 mg total) by mouth daily.    . tamsulosin (FLOMAX) 0.4 MG CAPS capsule TAKE 1 CAPSULE DAILY 90 capsule 1  . nitroGLYCERIN (NITROSTAT) 0.4 MG SL tablet Place 1 tablet (0.4 mg total) under the tongue every 5 (five) minutes as needed for chest pain. Up to three doses,if pain continues call 911 25 tablet 3   No facility-administered medications prior to  visit.      Per HPI unless specifically indicated in ROS section below Review of Systems     Objective:    BP 116/64 (BP Location: Right Arm, Patient Position: Sitting, Cuff Size: Large)   Pulse 70   Temp 97.8 F (36.6 C) (Oral)   Ht 5' 10" (1.778 m)   Wt 225 lb (102.1 kg)   SpO2 96%   BMI 32.28 kg/m   Wt Readings from Last 3 Encounters:  11/08/17 225 lb (102.1 kg)  07/10/17 232 lb 8 oz (105.5 kg)  05/18/17 235 lb 3.2 oz (106.7 kg)    Physical Exam  Constitutional: He appears well-developed and well-nourished. No distress.  HENT:  Head: Normocephalic and atraumatic.  Nose: Nose normal.  Mouth/Throat: Oropharynx is clear and moist. No oropharyngeal exudate.  Eyes: Conjunctivae and EOM are normal. Pupils are equal, round, and reactive to light. No scleral icterus.  Neck: Normal range of motion. Neck supple.  Cardiovascular: Normal rate, regular rhythm, normal heart sounds and intact distal pulses.  No murmur heard. Pulmonary/Chest: Effort normal and breath sounds normal. No respiratory distress. He has no wheezes. He has no rales.  Musculoskeletal: He exhibits no edema.  See HPI for foot exam if done  Lymphadenopathy:    He has no cervical adenopathy.  Skin: Skin is warm and dry. No rash noted.  Psychiatric: He has a normal mood and affect.  Nursing note and vitals reviewed.   Results for orders placed or performed in visit on 11/01/17  Renal function panel  Result Value Ref Range   Sodium 141 135 - 145 mEq/L   Potassium 4.0 3.5 - 5.1 mEq/L   Chloride 107 96 - 112 mEq/L   CO2 26 19 - 32 mEq/L   Calcium 8.8 8.4 - 10.5 mg/dL   Albumin 3.8 3.5 - 5.2 g/dL   BUN 26 (H) 6 - 23 mg/dL   Creatinine, Ser 1.91 (H) 0.40 - 1.50 mg/dL   Glucose, Bld 88 70 - 99 mg/dL   Phosphorus 3.3 2.3 - 4.6 mg/dL   GFR 35.78 (L) >60.00 mL/min  Hemoglobin A1c  Result Value Ref Range   Hgb A1c MFr Bld 7.6 (H) 4.6 - 6.5 %  Microalbumin / creatinine urine ratio  Result Value Ref Range    Microalb, Ur 35.9 (H) 0.0 - 1.9 mg/dL   Creatinine,U 160.2 mg/dL   Microalb Creat Ratio 22.4 0.0 - 30.0 mg/g   2D echo 10/2016 - reviewed, overall stable. Carotid US 02/2015 - reviewed, 1-39% stenosis.  While walking out of exam room, pt had falling forward episode, held onto doorknob to prevent fall. We were able to get him to lean back into a chair but he was shortly unresponsive and did briefly  pass out. Heart rate sounded regular, lung sounds were clear, cbg 156 and vitals were stable. Some labored breathing initially with drooling and gurgling that quickly resolved. No witnessed convulsions or post-ictal period. He drank an apple juice. I asked him and wife to stay for 15 min to reassess, but they decided to leave before I was able to reassess patient. My CMAs stated that he refused to stay despite our request, declined wheelchair and left ambulating with assistance of his walker.       Assessment & Plan:   Problem List Items Addressed This Visit    CKD stage 3 due to type 2 diabetes mellitus ([place])    New baseline Cr seems to be 1.9      Relevant Medications   insulin lispro (HUMALOG KWIKPEN) 100 UNIT/ML KiwkPen   Insulin Glargine (BASAGLAR KWIKPEN) 100 UNIT/ML SOPN   Diabetes mellitus type 2 with retinopathy (HCC)   Relevant Medications   insulin lispro (HUMALOG KWIKPEN) 100 UNIT/ML KiwkPen   Insulin Glargine (BASAGLAR KWIKPEN) 100 UNIT/ML SOPN   Frequent falls   Relevant Orders   CT HEAD W & WO CONTRAST   Gait disorder   Orthostatic headache    Describes orthostatic headache that improves when supine, occurring daily over last few months. Discussed risks/benefits of further imaging - would be CT or MRI with contrast (and need for caution in CKD). He has St Jude valve so unsure if we would be able to obtain MRI. Pt declines imaging at this time.       Relevant Medications   acetaminophen (TYLENOL) 500 MG tablet   sertraline (ZOLOFT) 25 MG tablet   Other Relevant Orders   CT  HEAD W & WO CONTRAST   Pacemaker    St. Jude dual-chamber pacemaker placed 09/2014 Lovena Le)      SDH (subdural hematoma) ([place])    H/o this 10/2016 but seemed to have resolved on latest imaging 04/2017, now with recurrence of headaches.      Syncope - Primary    Episode of syncope in office today, pt left before I was able to re-evaluate him. Unclear etiology of recurrent syncopal episodes - this one was not due to hypoglycemia. ?orthostasis as it occurred when he got up to leave after the visit. Reviewed latest carotid and echocardiogram and head CT. Will recommend head re-imaging. He has St Jude dual-chamber pacemaker placed 09/2014 Lovena Le) - will need to see if this is MR-compatible device. If not, will likely need contrasted CT - see below.       Relevant Medications   isosorbide mononitrate (IMDUR) 30 MG 24 hr tablet   Other Relevant Orders   CT HEAD W & WO CONTRAST   Type 2 diabetes, controlled, with neuropathy (HCC)    Chronic, stable. No noted hypoglycemias. Continue current regimen. Goal A1c <8%      Relevant Medications   insulin lispro (HUMALOG KWIKPEN) 100 UNIT/ML KiwkPen   Insulin Glargine (BASAGLAR KWIKPEN) 100 UNIT/ML SOPN    Other Visit Diagnoses    Other headache syndrome       Relevant Medications   acetaminophen (TYLENOL) 500 MG tablet   sertraline (ZOLOFT) 25 MG tablet   Other Relevant Orders   CT HEAD W & WO CONTRAST   Nonintractable episodic headache, unspecified headache type       Relevant Medications   acetaminophen (TYLENOL) 500 MG tablet   sertraline (ZOLOFT) 25 MG tablet      ADDENDUM ==> unable to get MRI with his  pacemaker brand. Will get contrasted head CT instead. Ordered 11/14/2017.   Follow up plan: Return in about 4 months (around 03/09/2018) for follow up visit.  [name], MD

## 2017-11-08 NOTE — Assessment & Plan Note (Signed)
St. Jude dual-chamber pacemaker placed 09/2014 Lovena Le)

## 2017-11-08 NOTE — Assessment & Plan Note (Signed)
Describes orthostatic headache that improves when supine, occurring daily over last few months. Discussed risks/benefits of further imaging - would be CT or MRI with contrast (and need for caution in CKD). He has St Jude valve so unsure if we would be able to obtain MRI. Pt declines imaging at this time.

## 2017-11-08 NOTE — Assessment & Plan Note (Addendum)
Chronic, stable. No noted hypoglycemias. Continue current regimen. Goal A1c <8%

## 2017-11-08 NOTE — Assessment & Plan Note (Signed)
New baseline Cr seems to be 1.9

## 2017-11-08 NOTE — Assessment & Plan Note (Addendum)
Episode of syncope in office today, pt left before I was able to re-evaluate him. Unclear etiology of recurrent syncopal episodes - this one was not due to hypoglycemia. ?orthostasis as it occurred when he got up to leave after the visit. Reviewed latest carotid and echocardiogram and head CT. Will recommend head re-imaging. He has St Jude dual-chamber pacemaker placed 09/2014 Frank Simpson) - will need to see if this is MR-compatible device. If not, will likely need contrasted CT - see below.

## 2017-11-09 ENCOUNTER — Telehealth: Payer: Self-pay | Admitting: Family Medicine

## 2017-11-09 NOTE — Telephone Encounter (Signed)
Spoke with pt's wife, Pamala Hurry (on dpr). Says they went to get some something to eat after leaving our office yesterday and pt was much better.  Says she thinks it was too far between meals.  I also relayed message per Dr. Darnell Level. She says ok.

## 2017-11-09 NOTE — Telephone Encounter (Signed)
plz call for an update on how pt is doing after passing out yesterday in office. Also notify wife I do recommend proceeding with head imaging and we are checking to see if he can get MRI with his current pacemaker

## 2017-11-12 NOTE — Addendum Note (Signed)
Addended by: [name] on: 11/12/2017 09:06 PM   Modules accepted: Orders

## 2017-11-14 ENCOUNTER — Ambulatory Visit (INDEPENDENT_AMBULATORY_CARE_PROVIDER_SITE_OTHER): Payer: Medicare Other

## 2017-11-14 VITALS — BP 118/60 | HR 72 | Temp 97.5°F | Ht 70.5 in | Wt 234.5 lb

## 2017-11-14 DIAGNOSIS — R269 Unspecified abnormalities of gait and mobility: Secondary | ICD-10-CM | POA: Diagnosis not present

## 2017-11-14 DIAGNOSIS — R296 Repeated falls: Secondary | ICD-10-CM | POA: Diagnosis not present

## 2017-11-14 DIAGNOSIS — Z0000 Encounter for general adult medical examination without abnormal findings: Secondary | ICD-10-CM | POA: Diagnosis not present

## 2017-11-14 NOTE — Patient Instructions (Signed)
Frank Simpson , Thank you for taking time to come for your Medicare Wellness Visit. I appreciate your ongoing commitment to your health goals. Please review the following plan we discussed and let me know if I can assist you in the future.   These are the goals we discussed: Goals    . DIET - INCREASE WATER INTAKE     Starting 11/14/2017, I will attempt to drink at least 6 glasses of water daily.        This is a list of the screening recommended for you and due dates:  Health Maintenance  Topic Date Due  . Complete foot exam   03/07/2018  . Eye exam for diabetics  03/28/2018  . Hemoglobin A1C  05/02/2018  . Urine Protein Check  11/01/2018  . DTaP/Tdap/Td vaccine (2 - Td) 02/15/2026  . Tetanus Vaccine  02/15/2026  . Flu Shot  Completed  . Pneumonia vaccines  Completed   Preventive Care for Adults  A healthy lifestyle and preventive care can promote health and wellness. Preventive health guidelines for adults include the following key practices.  . A routine yearly physical is a good way to check with your health care provider about your health and preventive screening. It is a chance to share any concerns and updates on your health and to receive a thorough exam.  . Visit your dentist for a routine exam and preventive care every 6 months. Brush your teeth twice a day and floss once a day. Good oral hygiene prevents tooth decay and gum disease.  . The frequency of eye exams is based on your age, health, family medical history, use  of contact lenses, and other factors. Follow your health care provider's recommendations for frequency of eye exams.  . Eat a healthy diet. Foods like vegetables, fruits, whole grains, low-fat dairy products, and lean protein foods contain the nutrients you need without too many calories. Decrease your intake of foods high in solid fats, added sugars, and salt. Eat the right amount of calories for you. Get information about a proper diet from your health care  provider, if necessary.  . Regular physical exercise is one of the most important things you can do for your health. Most adults should get at least 150 minutes of moderate-intensity exercise (any activity that increases your heart rate and causes you to sweat) each week. In addition, most adults need muscle-strengthening exercises on 2 or more days a week.  Silver Sneakers may be a benefit available to you. To determine eligibility, you may visit the website: www.silversneakers.com or contact program at 856-833-9975 Mon-Fri between 8AM-8PM.   . Maintain a healthy weight. The body mass index (BMI) is a screening tool to identify possible weight problems. It provides an estimate of body fat based on height and weight. Your health care provider can find your BMI and can help you achieve or maintain a healthy weight.   For adults 20 years and older: ? A BMI below 18.5 is considered underweight. ? A BMI of 18.5 to 24.9 is normal. ? A BMI of 25 to 29.9 is considered overweight. ? A BMI of 30 and above is considered obese.   . Maintain normal blood lipids and cholesterol levels by exercising and minimizing your intake of saturated fat. Eat a balanced diet with plenty of fruit and vegetables. Blood tests for lipids and cholesterol should begin at age 80 and be repeated every 5 years. If your lipid or cholesterol levels are high, you are  over 31, or you are at high risk for heart disease, you may need your cholesterol levels checked more frequently. Ongoing high lipid and cholesterol levels should be treated with medicines if diet and exercise are not working.  . If you smoke, find out from your health care provider how to quit. If you do not use tobacco, please do not start.  . If you choose to drink alcohol, please do not consume more than 2 drinks per day. One drink is considered to be 12 ounces (355 mL) of beer, 5 ounces (148 mL) of wine, or 1.5 ounces (44 mL) of liquor.  . If you are 64-79 years  old, ask your health care provider if you should take aspirin to prevent strokes.  . Use sunscreen. Apply sunscreen liberally and repeatedly throughout the day. You should seek shade when your shadow is shorter than you. Protect yourself by wearing long sleeves, pants, a wide-brimmed hat, and sunglasses year round, whenever you are outdoors.  . Once a month, do a whole body skin exam, using a mirror to look at the skin on your back. Tell your health care provider of new moles, moles that have irregular borders, moles that are larger than a pencil eraser, or moles that have changed in shape or color.
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
## 2017-11-14 NOTE — Addendum Note (Signed)
Addended by: [name] on: 11/14/2017 09:39 AM   Modules accepted: Orders

## 2017-11-14 NOTE — Progress Notes (Signed)
Pre visit review using our clinic review tool, if applicable. No additional management support is needed unless otherwise documented below in the visit note.

## 2017-11-14 NOTE — Progress Notes (Signed)
PCP notes:   Health maintenance:  No gaps identified.  Abnormal screenings:   Fall risk - per spouse, pt falls at least once monthly Depression score: 15 Hearing - failed  Hearing Screening   125Hz 250Hz 500Hz 1000Hz 2000Hz 3000Hz 4000Hz 6000Hz 8000Hz  Right ear:   0 0 0  0    Left ear:   0 0 40  0      Patient concerns:   Spouse requested a lift chair and hospital bed. Advised spouse HH PT will need to eval patient. [facility] referral request sent to PCP.  Nurse concerns:  None  Next PCP appt:   03/08/2018 @ 1215

## 2017-11-19 NOTE — Progress Notes (Signed)
I reviewed health advisor's note, was available for consultation, and agree with documentation and plan.  Will place home health referral for ongoing falls for home safety evaluation and eval for assistive devices for home use (wife specifically interested in hospital bed and lift chair).

## 2017-11-20 ENCOUNTER — Telehealth: Payer: Self-pay | Admitting: Family Medicine

## 2017-11-20 NOTE — Telephone Encounter (Signed)
Stacy,CT,called.  Patient is scheduled for a CT on Wednesday.  Is it okay to give patient IV contrast?

## 2017-11-20 NOTE — Telephone Encounter (Signed)
Would have them use as low dose as possible given CKD.

## 2017-11-20 NOTE — Telephone Encounter (Signed)
Left message on vm for Stacy relaying message per Dr. Darnell Level.

## 2017-11-22 ENCOUNTER — Ambulatory Visit (INDEPENDENT_AMBULATORY_CARE_PROVIDER_SITE_OTHER)
Admission: RE | Admit: 2017-11-22 | Discharge: 2017-11-22 | Disposition: A | Discharge status: Home / Self Care | Payer: Medicare Other | Source: Ambulatory Visit | Attending: Family Medicine | Admitting: Family Medicine

## 2017-11-22 DIAGNOSIS — G4489 Other headache syndrome: Secondary | ICD-10-CM

## 2017-11-22 DIAGNOSIS — R55 Syncope and collapse: Secondary | ICD-10-CM

## 2017-11-22 DIAGNOSIS — R296 Repeated falls: Secondary | ICD-10-CM | POA: Diagnosis not present

## 2017-11-22 DIAGNOSIS — R51 Headache with orthostatic component, not elsewhere classified: Secondary | ICD-10-CM

## 2017-11-22 MED ORDER — IOPAMIDOL (ISOVUE-300) INJECTION 61%
50.0000 mL | Freq: Once | INTRAVENOUS | Status: AC | PRN
  Administered 2017-11-22: 50 mL via INTRAVENOUS

## 2017-11-27 ENCOUNTER — Other Ambulatory Visit: Payer: Self-pay | Admitting: Family Medicine

## 2017-11-27 DIAGNOSIS — R413 Other amnesia: Secondary | ICD-10-CM

## 2017-11-27 DIAGNOSIS — R51 Headache with orthostatic component, not elsewhere classified: Secondary | ICD-10-CM

## 2017-11-27 DIAGNOSIS — R296 Repeated falls: Secondary | ICD-10-CM

## 2017-11-29 DIAGNOSIS — D0439 Carcinoma in situ of skin of other parts of face: Secondary | ICD-10-CM | POA: Diagnosis not present

## 2017-11-29 DIAGNOSIS — Z85828 Personal history of other malignant neoplasm of skin: Secondary | ICD-10-CM | POA: Diagnosis not present

## 2017-11-29 DIAGNOSIS — D1801 Hemangioma of skin and subcutaneous tissue: Secondary | ICD-10-CM | POA: Diagnosis not present

## 2017-11-29 DIAGNOSIS — L570 Actinic keratosis: Secondary | ICD-10-CM | POA: Diagnosis not present

## 2017-11-29 DIAGNOSIS — L821 Other seborrheic keratosis: Secondary | ICD-10-CM | POA: Diagnosis not present

## 2017-11-29 DIAGNOSIS — L814 Other melanin hyperpigmentation: Secondary | ICD-10-CM | POA: Diagnosis not present

## 2017-11-29 DIAGNOSIS — L720 Epidermal cyst: Secondary | ICD-10-CM | POA: Diagnosis not present

## 2017-11-29 DIAGNOSIS — D045 Carcinoma in situ of skin of trunk: Secondary | ICD-10-CM | POA: Diagnosis not present

## 2017-11-30 ENCOUNTER — Telehealth: Payer: Self-pay | Admitting: Family Medicine

## 2017-11-30 ENCOUNTER — Ambulatory Visit (INDEPENDENT_AMBULATORY_CARE_PROVIDER_SITE_OTHER): Payer: Medicare Other | Admitting: Podiatry

## 2017-11-30 DIAGNOSIS — M79675 Pain in left toe(s): Secondary | ICD-10-CM | POA: Diagnosis not present

## 2017-11-30 DIAGNOSIS — B351 Tinea unguium: Secondary | ICD-10-CM

## 2017-11-30 DIAGNOSIS — Z96651 Presence of right artificial knee joint: Secondary | ICD-10-CM

## 2017-11-30 DIAGNOSIS — M79674 Pain in right toe(s): Secondary | ICD-10-CM | POA: Diagnosis not present

## 2017-11-30 DIAGNOSIS — N183 Chronic kidney disease, stage 3 (moderate): Secondary | ICD-10-CM | POA: Diagnosis not present

## 2017-11-30 DIAGNOSIS — I5022 Chronic systolic (congestive) heart failure: Secondary | ICD-10-CM | POA: Diagnosis not present

## 2017-11-30 DIAGNOSIS — Z794 Long term (current) use of insulin: Secondary | ICD-10-CM

## 2017-11-30 DIAGNOSIS — Z7982 Long term (current) use of aspirin: Secondary | ICD-10-CM

## 2017-11-30 DIAGNOSIS — Z9181 History of falling: Secondary | ICD-10-CM | POA: Diagnosis not present

## 2017-11-30 DIAGNOSIS — Z950 Presence of cardiac pacemaker: Secondary | ICD-10-CM

## 2017-11-30 DIAGNOSIS — E11319 Type 2 diabetes mellitus with unspecified diabetic retinopathy without macular edema: Secondary | ICD-10-CM

## 2017-11-30 DIAGNOSIS — I2510 Atherosclerotic heart disease of native coronary artery without angina pectoris: Secondary | ICD-10-CM | POA: Diagnosis not present

## 2017-11-30 DIAGNOSIS — E1140 Type 2 diabetes mellitus with diabetic neuropathy, unspecified: Secondary | ICD-10-CM | POA: Diagnosis not present

## 2017-11-30 DIAGNOSIS — E1149 Type 2 diabetes mellitus with other diabetic neurological complication: Secondary | ICD-10-CM

## 2017-11-30 DIAGNOSIS — R262 Difficulty in walking, not elsewhere classified: Secondary | ICD-10-CM | POA: Diagnosis not present

## 2017-11-30 NOTE — Telephone Encounter (Signed)
Copied from [place] [id number]. Topic: Quick Communication - See Telephone Encounter >> Nov 30, 2017  2:21 PM Ether Griffins B wrote: CRM for notification. See Telephone encounter for:  Amy with [facility] calling needing verbal orders for PT 1x for 1 week 2x for 5 weeks and 1x for 1 week for balance, transfer and gate training. Call back number [phone] 11/30/17.

## 2017-11-30 NOTE — Telephone Encounter (Signed)
Signed in error

## 2017-11-30 NOTE — Telephone Encounter (Signed)
Agree with this. Thank you

## 2017-12-01 NOTE — Telephone Encounter (Signed)
Spoke with Amy relaying message per Dr. Darnell Level.  She says ok.

## 2017-12-02 ENCOUNTER — Other Ambulatory Visit: Payer: Self-pay

## 2017-12-02 ENCOUNTER — Encounter (HOSPITAL_COMMUNITY): Payer: Self-pay | Admitting: *Deleted

## 2017-12-02 ENCOUNTER — Inpatient Hospital Stay (HOSPITAL_COMMUNITY): Payer: Medicare Other

## 2017-12-02 ENCOUNTER — Emergency Department (HOSPITAL_COMMUNITY): Payer: Medicare Other

## 2017-12-02 ENCOUNTER — Inpatient Hospital Stay (HOSPITAL_COMMUNITY)
Admission: EM | Admit: 2017-12-02 | Discharge: 2017-12-08 | DRG: 480 | Disposition: A | Discharge status: Skilled Nursing Facility | Payer: Medicare Other | Attending: Family Medicine | Admitting: Family Medicine

## 2017-12-02 DIAGNOSIS — E1142 Type 2 diabetes mellitus with diabetic polyneuropathy: Secondary | ICD-10-CM | POA: Diagnosis present

## 2017-12-02 DIAGNOSIS — Z951 Presence of aortocoronary bypass graft: Secondary | ICD-10-CM | POA: Diagnosis not present

## 2017-12-02 DIAGNOSIS — T17920A Food in respiratory tract, part unspecified causing asphyxiation, initial encounter: Secondary | ICD-10-CM

## 2017-12-02 DIAGNOSIS — I5032 Chronic diastolic (congestive) heart failure: Secondary | ICD-10-CM | POA: Diagnosis present

## 2017-12-02 DIAGNOSIS — R05 Cough: Secondary | ICD-10-CM | POA: Diagnosis not present

## 2017-12-02 DIAGNOSIS — Z833 Family history of diabetes mellitus: Secondary | ICD-10-CM | POA: Diagnosis not present

## 2017-12-02 DIAGNOSIS — Y9301 Activity, walking, marching and hiking: Secondary | ICD-10-CM | POA: Diagnosis present

## 2017-12-02 DIAGNOSIS — D696 Thrombocytopenia, unspecified: Secondary | ICD-10-CM | POA: Diagnosis present

## 2017-12-02 DIAGNOSIS — E1140 Type 2 diabetes mellitus with diabetic neuropathy, unspecified: Secondary | ICD-10-CM | POA: Diagnosis not present

## 2017-12-02 DIAGNOSIS — Z01810 Encounter for preprocedural cardiovascular examination: Secondary | ICD-10-CM

## 2017-12-02 DIAGNOSIS — N179 Acute kidney failure, unspecified: Secondary | ICD-10-CM | POA: Diagnosis not present

## 2017-12-02 DIAGNOSIS — Z8673 Personal history of transient ischemic attack (TIA), and cerebral infarction without residual deficits: Secondary | ICD-10-CM | POA: Diagnosis not present

## 2017-12-02 DIAGNOSIS — W19XXXD Unspecified fall, subsequent encounter: Secondary | ICD-10-CM | POA: Diagnosis not present

## 2017-12-02 DIAGNOSIS — Z7982 Long term (current) use of aspirin: Secondary | ICD-10-CM

## 2017-12-02 DIAGNOSIS — E7800 Pure hypercholesterolemia, unspecified: Secondary | ICD-10-CM | POA: Diagnosis present

## 2017-12-02 DIAGNOSIS — Y92018 Other place in single-family (private) house as the place of occurrence of the external cause: Secondary | ICD-10-CM

## 2017-12-02 DIAGNOSIS — E785 Hyperlipidemia, unspecified: Secondary | ICD-10-CM | POA: Diagnosis present

## 2017-12-02 DIAGNOSIS — M25551 Pain in right hip: Secondary | ICD-10-CM | POA: Diagnosis not present

## 2017-12-02 DIAGNOSIS — E113293 Type 2 diabetes mellitus with mild nonproliferative diabetic retinopathy without macular edema, bilateral: Secondary | ICD-10-CM

## 2017-12-02 DIAGNOSIS — I252 Old myocardial infarction: Secondary | ICD-10-CM | POA: Diagnosis not present

## 2017-12-02 DIAGNOSIS — Z794 Long term (current) use of insulin: Secondary | ICD-10-CM | POA: Diagnosis not present

## 2017-12-02 DIAGNOSIS — S62616D Displaced fracture of proximal phalanx of right little finger, subsequent encounter for fracture with routine healing: Secondary | ICD-10-CM | POA: Diagnosis not present

## 2017-12-02 DIAGNOSIS — S79911A Unspecified injury of right hip, initial encounter: Secondary | ICD-10-CM | POA: Diagnosis not present

## 2017-12-02 DIAGNOSIS — Z96651 Presence of right artificial knee joint: Secondary | ICD-10-CM | POA: Diagnosis present

## 2017-12-02 DIAGNOSIS — W010XXA Fall on same level from slipping, tripping and stumbling without subsequent striking against object, initial encounter: Secondary | ICD-10-CM | POA: Diagnosis present

## 2017-12-02 DIAGNOSIS — J690 Pneumonitis due to inhalation of food and vomit: Secondary | ICD-10-CM | POA: Diagnosis not present

## 2017-12-02 DIAGNOSIS — S72001A Fracture of unspecified part of neck of right femur, initial encounter for closed fracture: Secondary | ICD-10-CM | POA: Diagnosis not present

## 2017-12-02 DIAGNOSIS — R296 Repeated falls: Secondary | ICD-10-CM | POA: Diagnosis present

## 2017-12-02 DIAGNOSIS — I130 Hypertensive heart and chronic kidney disease with heart failure and stage 1 through stage 4 chronic kidney disease, or unspecified chronic kidney disease: Secondary | ICD-10-CM | POA: Diagnosis present

## 2017-12-02 DIAGNOSIS — S199XXA Unspecified injury of neck, initial encounter: Secondary | ICD-10-CM | POA: Diagnosis not present

## 2017-12-02 DIAGNOSIS — J9601 Acute respiratory failure with hypoxia: Secondary | ICD-10-CM | POA: Diagnosis not present

## 2017-12-02 DIAGNOSIS — D62 Acute posthemorrhagic anemia: Secondary | ICD-10-CM | POA: Diagnosis not present

## 2017-12-02 DIAGNOSIS — I2510 Atherosclerotic heart disease of native coronary artery without angina pectoris: Secondary | ICD-10-CM | POA: Diagnosis not present

## 2017-12-02 DIAGNOSIS — Z95818 Presence of other cardiac implants and grafts: Secondary | ICD-10-CM | POA: Diagnosis not present

## 2017-12-02 DIAGNOSIS — Z950 Presence of cardiac pacemaker: Secondary | ICD-10-CM

## 2017-12-02 DIAGNOSIS — F0390 Unspecified dementia without behavioral disturbance: Secondary | ICD-10-CM | POA: Diagnosis present

## 2017-12-02 DIAGNOSIS — N183 Chronic kidney disease, stage 3 (moderate): Secondary | ICD-10-CM | POA: Diagnosis not present

## 2017-12-02 DIAGNOSIS — I5022 Chronic systolic (congestive) heart failure: Secondary | ICD-10-CM | POA: Diagnosis not present

## 2017-12-02 DIAGNOSIS — W19XXXA Unspecified fall, initial encounter: Secondary | ICD-10-CM

## 2017-12-02 DIAGNOSIS — Z4789 Encounter for other orthopedic aftercare: Secondary | ICD-10-CM | POA: Diagnosis not present

## 2017-12-02 DIAGNOSIS — T17928A Food in respiratory tract, part unspecified causing other injury, initial encounter: Secondary | ICD-10-CM

## 2017-12-02 DIAGNOSIS — Z79899 Other long term (current) drug therapy: Secondary | ICD-10-CM

## 2017-12-02 DIAGNOSIS — W19XXXS Unspecified fall, sequela: Secondary | ICD-10-CM | POA: Diagnosis not present

## 2017-12-02 DIAGNOSIS — Z85820 Personal history of malignant melanoma of skin: Secondary | ICD-10-CM

## 2017-12-02 DIAGNOSIS — T17908A Unspecified foreign body in respiratory tract, part unspecified causing other injury, initial encounter: Secondary | ICD-10-CM

## 2017-12-02 DIAGNOSIS — S72141A Displaced intertrochanteric fracture of right femur, initial encounter for closed fracture: Principal | ICD-10-CM | POA: Diagnosis present

## 2017-12-02 DIAGNOSIS — D72829 Elevated white blood cell count, unspecified: Secondary | ICD-10-CM | POA: Diagnosis not present

## 2017-12-02 DIAGNOSIS — N184 Chronic kidney disease, stage 4 (severe): Secondary | ICD-10-CM | POA: Diagnosis present

## 2017-12-02 DIAGNOSIS — R2689 Other abnormalities of gait and mobility: Secondary | ICD-10-CM | POA: Diagnosis not present

## 2017-12-02 DIAGNOSIS — S0990XA Unspecified injury of head, initial encounter: Secondary | ICD-10-CM | POA: Diagnosis not present

## 2017-12-02 DIAGNOSIS — Z955 Presence of coronary angioplasty implant and graft: Secondary | ICD-10-CM

## 2017-12-02 DIAGNOSIS — S299XXA Unspecified injury of thorax, initial encounter: Secondary | ICD-10-CM | POA: Diagnosis not present

## 2017-12-02 DIAGNOSIS — E1122 Type 2 diabetes mellitus with diabetic chronic kidney disease: Secondary | ICD-10-CM | POA: Diagnosis present

## 2017-12-02 DIAGNOSIS — R1312 Dysphagia, oropharyngeal phase: Secondary | ICD-10-CM | POA: Diagnosis not present

## 2017-12-02 DIAGNOSIS — G8911 Acute pain due to trauma: Secondary | ICD-10-CM | POA: Diagnosis not present

## 2017-12-02 DIAGNOSIS — S72141D Displaced intertrochanteric fracture of right femur, subsequent encounter for closed fracture with routine healing: Secondary | ICD-10-CM | POA: Diagnosis not present

## 2017-12-02 DIAGNOSIS — H409 Unspecified glaucoma: Secondary | ICD-10-CM | POA: Diagnosis present

## 2017-12-02 DIAGNOSIS — E11319 Type 2 diabetes mellitus with unspecified diabetic retinopathy without macular edema: Secondary | ICD-10-CM | POA: Diagnosis present

## 2017-12-02 DIAGNOSIS — Z419 Encounter for procedure for purposes other than remedying health state, unspecified: Secondary | ICD-10-CM

## 2017-12-02 DIAGNOSIS — T17908D Unspecified foreign body in respiratory tract, part unspecified causing other injury, subsequent encounter: Secondary | ICD-10-CM | POA: Diagnosis not present

## 2017-12-02 DIAGNOSIS — M6281 Muscle weakness (generalized): Secondary | ICD-10-CM | POA: Diagnosis not present

## 2017-12-02 LAB — CBC WITH DIFFERENTIAL/PLATELET
BASOS ABS: 0 10*3/uL (ref 0.0–0.1)
Basophils Relative: 0 %
EOS PCT: 1 %
Eosinophils Absolute: 0.1 10*3/uL (ref 0.0–0.7)
HEMATOCRIT: 46.5 % (ref 39.0–52.0)
Hemoglobin: 15.3 g/dL (ref 13.0–17.0)
LYMPHS PCT: 22 %
Lymphs Abs: 2.4 10*3/uL (ref 0.7–4.0)
MCH: 30.1 pg (ref 26.0–34.0)
MCHC: 32.9 g/dL (ref 30.0–36.0)
MCV: 91.4 fL (ref 78.0–100.0)
MONO ABS: 0.7 10*3/uL (ref 0.1–1.0)
MONOS PCT: 6 %
NEUTROS ABS: 7.7 10*3/uL (ref 1.7–7.7)
Neutrophils Relative %: 71 %
PLATELETS: 171 10*3/uL (ref 150–400)
RBC: 5.09 MIL/uL (ref 4.22–5.81)
RDW: 13.5 % (ref 11.5–15.5)
WBC: 10.9 10*3/uL — ABNORMAL HIGH (ref 4.0–10.5)

## 2017-12-02 LAB — TYPE AND SCREEN
ABO/RH(D): B POS
Antibody Screen: NEGATIVE

## 2017-12-02 LAB — PROTIME-INR
INR: 1.02
Prothrombin Time: 13.3 seconds (ref 11.4–15.2)

## 2017-12-02 LAB — BRAIN NATRIURETIC PEPTIDE: B NATRIURETIC PEPTIDE 5: 557.7 pg/mL — AB (ref 0.0–100.0)

## 2017-12-02 LAB — BASIC METABOLIC PANEL
ANION GAP: 12 (ref 5–15)
BUN: 37 mg/dL — AB (ref 6–20)
CALCIUM: 8.7 mg/dL — AB (ref 8.9–10.3)
CO2: 20 mmol/L — AB (ref 22–32)
Chloride: 102 mmol/L (ref 101–111)
Creatinine, Ser: 2.26 mg/dL — ABNORMAL HIGH (ref 0.61–1.24)
GFR calc Af Amer: 29 mL/min — ABNORMAL LOW (ref >60)
GFR, EST NON AFRICAN AMERICAN: 25 mL/min — AB (ref >60)
Glucose, Bld: 251 mg/dL — ABNORMAL HIGH (ref 65–99)
POTASSIUM: 4.4 mmol/L (ref 3.5–5.1)
Sodium: 134 mmol/L — ABNORMAL LOW (ref 135–145)

## 2017-12-02 LAB — GLUCOSE, CAPILLARY: Glucose-Capillary: 242 mg/dL — ABNORMAL HIGH (ref 65–99)

## 2017-12-02 MED ORDER — FENTANYL CITRATE (PF) 100 MCG/2ML IJ SOLN
12.5000 ug | INTRAMUSCULAR | Status: DC | PRN
  Administered 2017-12-03 (×2): 12.5 ug via INTRAVENOUS
  Filled 2017-12-02 (×2): qty 2

## 2017-12-02 MED ORDER — INSULIN GLARGINE 100 UNIT/ML ~~LOC~~ SOLN
25.0000 [IU] | Freq: Every day | SUBCUTANEOUS | Status: DC
  Administered 2017-12-03 – 2017-12-06 (×3): 25 [IU] via SUBCUTANEOUS
  Filled 2017-12-02 (×4): qty 0.25

## 2017-12-02 MED ORDER — ENOXAPARIN SODIUM 40 MG/0.4ML ~~LOC~~ SOLN
40.0000 mg | SUBCUTANEOUS | Status: DC
  Administered 2017-12-02 – 2017-12-07 (×5): 40 mg via SUBCUTANEOUS
  Filled 2017-12-02 (×5): qty 0.4

## 2017-12-02 MED ORDER — FENTANYL CITRATE (PF) 100 MCG/2ML IJ SOLN
25.0000 ug | Freq: Once | INTRAMUSCULAR | Status: AC
  Administered 2017-12-02: 25 ug via INTRAVENOUS
  Filled 2017-12-02: qty 2

## 2017-12-02 MED ORDER — INSULIN ASPART 100 UNIT/ML ~~LOC~~ SOLN
0.0000 [IU] | Freq: Every day | SUBCUTANEOUS | Status: DC
  Administered 2017-12-02: 2 [IU] via SUBCUTANEOUS
  Administered 2017-12-05 – 2017-12-07 (×3): 4 [IU] via SUBCUTANEOUS

## 2017-12-02 MED ORDER — BASAGLAR KWIKPEN 100 UNIT/ML ~~LOC~~ SOPN
25.0000 [IU] | PEN_INJECTOR | Freq: Every day | SUBCUTANEOUS | Status: DC
  Filled 2017-12-02: qty 3

## 2017-12-02 MED ORDER — SERTRALINE HCL 25 MG PO TABS
25.0000 mg | ORAL_TABLET | Freq: Every day | ORAL | Status: DC
  Administered 2017-12-03 – 2017-12-08 (×5): 25 mg via ORAL
  Filled 2017-12-02 (×5): qty 1

## 2017-12-02 MED ORDER — SENNA 8.6 MG PO TABS
1.0000 | ORAL_TABLET | Freq: Two times a day (BID) | ORAL | Status: DC
  Administered 2017-12-02 – 2017-12-08 (×10): 8.6 mg via ORAL
  Filled 2017-12-02 (×11): qty 1

## 2017-12-02 MED ORDER — ASPIRIN EC 81 MG PO TBEC
81.0000 mg | DELAYED_RELEASE_TABLET | Freq: Every day | ORAL | Status: DC
  Administered 2017-12-04 – 2017-12-07 (×3): 81 mg via ORAL
  Filled 2017-12-02 (×3): qty 1

## 2017-12-02 MED ORDER — INSULIN ASPART 100 UNIT/ML ~~LOC~~ SOLN
0.0000 [IU] | Freq: Three times a day (TID) | SUBCUTANEOUS | Status: DC
  Administered 2017-12-03: 11 [IU] via SUBCUTANEOUS
  Administered 2017-12-03: 20 [IU] via SUBCUTANEOUS
  Administered 2017-12-03: 7 [IU] via SUBCUTANEOUS
  Administered 2017-12-04: 3 [IU] via SUBCUTANEOUS
  Administered 2017-12-05: 11 [IU] via SUBCUTANEOUS
  Administered 2017-12-05: 7 [IU] via SUBCUTANEOUS
  Administered 2017-12-05: 15 [IU] via SUBCUTANEOUS
  Administered 2017-12-06 (×2): 11 [IU] via SUBCUTANEOUS
  Administered 2017-12-06: 7 [IU] via SUBCUTANEOUS
  Administered 2017-12-07: 11 [IU] via SUBCUTANEOUS
  Administered 2017-12-07: 20 [IU] via SUBCUTANEOUS
  Administered 2017-12-07 – 2017-12-08 (×2): 4 [IU] via SUBCUTANEOUS
  Administered 2017-12-08: 11 [IU] via SUBCUTANEOUS

## 2017-12-02 MED ORDER — MORPHINE SULFATE (PF) 4 MG/ML IV SOLN: 0.5000 mg | INTRAVENOUS | Status: DC | PRN

## 2017-12-02 MED ORDER — HYDROMORPHONE HCL 1 MG/ML IJ SOLN
1.0000 mg | Freq: Once | INTRAMUSCULAR | Status: AC
  Administered 2017-12-02: 1 mg via INTRAVENOUS
  Filled 2017-12-02: qty 1

## 2017-12-02 MED ORDER — TAMSULOSIN HCL 0.4 MG PO CAPS
0.4000 mg | ORAL_CAPSULE | Freq: Every day | ORAL | Status: DC
  Administered 2017-12-03 – 2017-12-08 (×5): 0.4 mg via ORAL
  Filled 2017-12-02 (×5): qty 1

## 2017-12-02 MED ORDER — ISOSORBIDE MONONITRATE ER 30 MG PO TB24
30.0000 mg | ORAL_TABLET | Freq: Every day | ORAL | Status: DC
  Administered 2017-12-03 – 2017-12-08 (×5): 30 mg via ORAL
  Filled 2017-12-02 (×5): qty 1

## 2017-12-02 MED ORDER — ROSUVASTATIN CALCIUM 10 MG PO TABS
20.0000 mg | ORAL_TABLET | Freq: Every day | ORAL | Status: DC
  Administered 2017-12-02 – 2017-12-07 (×5): 20 mg via ORAL
  Filled 2017-12-02 (×2): qty 1
  Filled 2017-12-02: qty 2
  Filled 2017-12-02: qty 1
  Filled 2017-12-02 (×4): qty 2

## 2017-12-02 NOTE — ED Notes (Signed)
Patient transported to X-ray

## 2017-12-02 NOTE — ED Triage Notes (Addendum)
Pt brought here by wife from home after tripping in yard while using walker.  Wife states pt hit head when he landed.  R leg rotated outward.  Wife states pt has been weaker than normal and has been drooling this last week.  Memory loss is chronic.

## 2017-12-02 NOTE — Progress Notes (Signed)
Patient arrived to [facility] from [place]. RN notified [facility].

## 2017-12-02 NOTE — ED Notes (Signed)
Pt will go upstairs as soon as he returns from xray

## 2017-12-02 NOTE — ED Notes (Signed)
Patient transported to CT
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
## 2017-12-02 NOTE — ED Provider Notes (Signed)
Face-to-face evaluation   History: Here for evaluation of trip and fall injuring right hip.  He had a secondary injury to the head when he fell.  There was no loss of consciousness.  The fall was preceded by some weakness ongoing for a couple of weeks.  Physical exam: Alert elderly man.  He speaks slowly, with a garbled speech.  Right hip shortened and externally rotated.  Left fifth finger with bruising, but normal motion.  Chest wall nontender to palpation.  Heart regular rate and rhythm.  Medical screening examination/treatment/procedure(s) were conducted as a shared visit with non-physician practitioner(s) and myself.  I personally evaluated the patient during the encounter     Daleen Bo, MD 12/02/17 2318
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
## 2017-12-03 DIAGNOSIS — S72141A Displaced intertrochanteric fracture of right femur, initial encounter for closed fracture: Principal | ICD-10-CM

## 2017-12-03 DIAGNOSIS — D72829 Elevated white blood cell count, unspecified: Secondary | ICD-10-CM | POA: Diagnosis present

## 2017-12-03 DIAGNOSIS — W19XXXD Unspecified fall, subsequent encounter: Secondary | ICD-10-CM

## 2017-12-03 DIAGNOSIS — Z01810 Encounter for preprocedural cardiovascular examination: Secondary | ICD-10-CM

## 2017-12-03 LAB — CBC
HCT: 41.9 % (ref 39.0–52.0)
HEMOGLOBIN: 13.7 g/dL (ref 13.0–17.0)
MCH: 30 pg (ref 26.0–34.0)
MCHC: 32.7 g/dL (ref 30.0–36.0)
MCV: 91.9 fL (ref 78.0–100.0)
PLATELETS: 157 10*3/uL (ref 150–400)
RBC: 4.56 MIL/uL (ref 4.22–5.81)
RDW: 13.7 % (ref 11.5–15.5)
WBC: 10.6 10*3/uL — ABNORMAL HIGH (ref 4.0–10.5)

## 2017-12-03 LAB — GLUCOSE, CAPILLARY
GLUCOSE-CAPILLARY: 226 mg/dL — AB (ref 65–99)
Glucose-Capillary: 283 mg/dL — ABNORMAL HIGH (ref 65–99)
Glucose-Capillary: 389 mg/dL — ABNORMAL HIGH (ref 65–99)
Glucose-Capillary: 187 mg/dL — ABNORMAL HIGH (ref 65–99)

## 2017-12-03 LAB — BASIC METABOLIC PANEL
Anion gap: 8 (ref 5–15)
BUN: 38 mg/dL — ABNORMAL HIGH (ref 6–20)
CALCIUM: 8.6 mg/dL — AB (ref 8.9–10.3)
CO2: 22 mmol/L (ref 22–32)
CREATININE: 2.25 mg/dL — AB (ref 0.61–1.24)
Chloride: 105 mmol/L (ref 101–111)
GFR calc Af Amer: 29 mL/min — ABNORMAL LOW (ref >60)
GFR calc non Af Amer: 25 mL/min — ABNORMAL LOW (ref >60)
GLUCOSE: 300 mg/dL — AB (ref 65–99)
Potassium: 5 mmol/L (ref 3.5–5.1)
Sodium: 135 mmol/L (ref 135–145)

## 2017-12-03 LAB — URINALYSIS, ROUTINE W REFLEX MICROSCOPIC
BACTERIA UA: NONE SEEN
Bilirubin Urine: NEGATIVE
Ketones, ur: NEGATIVE mg/dL
LEUKOCYTES UA: NEGATIVE
NITRITE: NEGATIVE
PH: 5 (ref 5.0–8.0)
PROTEIN: 30 mg/dL — AB
Specific Gravity, Urine: 1.021 (ref 1.005–1.030)
Squamous Epithelial / LPF: NONE SEEN

## 2017-12-03 LAB — SURGICAL PCR SCREEN
MRSA, PCR: POSITIVE — AB
STAPHYLOCOCCUS AUREUS: POSITIVE — AB

## 2017-12-03 MED ORDER — FENTANYL CITRATE (PF) 250 MCG/5ML IJ SOLN
INTRAMUSCULAR | Status: AC
  Filled 2017-12-03: qty 5

## 2017-12-03 MED ORDER — AMLODIPINE BESYLATE 5 MG PO TABS
5.0000 mg | ORAL_TABLET | Freq: Every day | ORAL | Status: DC
  Administered 2017-12-03 – 2017-12-08 (×5): 5 mg via ORAL
  Filled 2017-12-03 (×5): qty 1

## 2017-12-03 MED ORDER — PROPOFOL 10 MG/ML IV BOLUS
INTRAVENOUS | Status: AC
Start: 2017-12-03 — End: 2017-12-03
  Filled 2017-12-03: qty 20
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
## 2017-12-03 NOTE — Progress Notes (Signed)
Subjective: 84 y.o. returns the office today for painful, elongated, thickened toenails which he cannot trim himself. Denies any redness or drainage around the nails. Denies any acute changes since last appointment and no new complaints today. Denies any systemic complaints such as fevers, chills, nausea, vomiting.   Overall no acute changes no new concerns.  Objective: NAD DP/PT pulses palpable, CRT less than 3 seconds Protective sensation decreased with Simms Weinstein monofilament Nails hypertrophic, dystrophic, elongated, brittle, discolored 10. There is tenderness overlying the nails 1-5 bilaterally. There is no surrounding erythema or drainage along the nail sites. No open lesions or pre-ulcerative lesions are identified. No other areas of tenderness bilateral lower extremities. No overlying edema, erythema, increased warmth. No pain with calf compression, swelling, warmth, erythema.  Assessment: Patient presents with symptomatic onychomycosis  Plan: -Treatment options including alternatives, risks, complications were discussed -Nails sharply debrided 10 without complication/bleeding. -Discussed daily foot inspection. If there are any changes, to call the office immediately.  -Follow-up in 3 months or sooner if any problems are to arise. In the meantime, encouraged to call the office with any questions, concerns, changes symptoms.  Celesta Gentile, DPM

## 2017-12-03 NOTE — Progress Notes (Signed)
PROGRESS NOTE    Frank Simpson  VHQ:[id number] DOB: December 22, 1930 DOA: 12/02/2017 PCP: [name], MD  Brief Narrative: Frank Simpson is a 84 y.o. male with medical history significant of HTN, MI, CAD s/p DES to LAD, Diastolic dysfunction last EF 55-60% with grade 2 dysfunction, CHB s/p PPM, DM type II with peripheral neuropathy, Basal cell skin cancer s/p removal, syncope, and recurrent falls with previous SDH in 10/2016; who presents after having a mechanical fall at home reporting excruciating right leg pain.  Patient notes that he tripped over his walker which he normally uses at baseline to ambulate.  He has a long history of falls, and been noted to be progressively getting weaker.  He states that he did hit his head when he fell, but did not lose consciousness with this fall (notes loss of consciousness with previous falls). Denies having any  fever, chills, nausea, or vomiting symptoms.  Associated symptoms include complaints of headaches from sitting up and joint pains. CT scan of the brain and neck showed no acute intracranial or cervical abnormalities.  X-ray of the hip show a communicated intertrochanteric femoral neck fracture.  Patient was given 25 mcg of fentanyl initially for pain and subsequently 1 mg of Dilaudid.  Dr. Sharol Given of Orthopedics was consulted and plan on surgical repair 12/04/16. Cardiology was consulted for pre-operative clearance.   Assessment & Plan:   Principal Problem:   Fracture of femoral neck, right (HCC) Active Problems:   Hypercholesterolemia   CAD (coronary artery disease)   Diabetes mellitus type 2 with retinopathy (HCC)   Frequent falls   Chronic diastolic CHF (congestive heart failure) (HCC)   Leukocytosis   Closed intertrochanteric fracture of hip, right, initial encounter [facility])   Preoperative cardiovascular examination  Fall with Communicated intertrochanteric right femoral neck fracture:  -Acute.  Patient tripped over walker and presents with  right leg pain.   -Found to have acute femoral neck fracture.  -Revised cardiac risk assessment score is 5( Cr>2, DM tx w/ insulin, H/O MI, CVA, SDH, and CHF) which would mean patient is greater than 11% risk for major cardiac event.  -Admitted to telemetry bed -Hip fracture order set initiated -Pain Control with Fentanyl 12.5-25 mcg every 3 hours prn  -Cardiology consulted for Pre-operative Risk Assessment and feel his at increased risk but not prohibitive for surgery and they do not recommend any additional cardiac testing prior to Surgery -Orthopedic Surgery Dr. Sharol Given evaluated and is planning for Surgery 12/04/17 around 4:00 pm   Chronic Kidney Disease stage IV -Patient appears to have a baseline creatinine of around 1.6- 2, who presents with a creatinine of 2.26 and BUN 37. -Repeat BMP showed BUN/Cr at 38/2.25 -Avoid Nephrotoxic Medications if possible -Repeat CMP in AM   Leukocytosis -WBC 10.9 on admission. Repeat was 10.6 -Suspect that this could be reactive to the acute fracture. -Patient is Afebrile -Check Urinalysis (Not done yet)  Diabetes Mellitus Type 2  -Patient presents with initial blood glucose of 251 on admission.   -Last hemoglobin A1c noted to be 7.6 on 11/08/2017. -C/w Hypoglycemic protocols -Decreased glargine insulin from 50 units to 25 units prior to surgery -C/w Resistant Novolog SSI AC/HS -CBG's ranging  413-244  Chronic Diastolic CHF  -Patient does not appear to be grossly fluid overloaded at this time.  Last EF 60-65% with grade 1 diastolic dysfunction in 11/29/70. -Strict I's/Os, Daily Weights -Patient is -250 mL however not accurate because condom catheter came off and patient urinated in  bed.  -Continue Isosorbide Mononitrate 30 mg po Daily -Checked BNP and was 557.7 -Continue to Monitor Volume Status   Essential Hypertension -Continue Amlodipine 5 mg po Daily  CAD s/p CABG and DES - Continue ASA 81 mg po Daily, Rosuvastatin 20 mg po Daily,  and Isosorbide Mononitrate 30 mg po Daily  Complete heart block s/p PPM -C/w Telemetry -Pacemaker last interrogated on 10/16/2017.   -Followed by Dr. Angelena Form.  Hyperlipidemia -Check Lipid Panel in AM -Continue Rosuvastatin 20 mg po qHS  Hx of Subdural Hemorrhage -Head and Cervical CT showed no evidence for acute intracranial abnormality, atrophy and small vessel disease, opacification of the left maxillary sinus without imaged fx, and cervical spine degenerative changes without acute fracture.  -Appears Stable   DVT prophylaxis: Enoxaparin 40 mg sq q24h Code Status: FULL CODE Family Communication: Discussed with  Disposition Plan: Remain Inpatient for Hip Surgery tomorrow  Consultants:   Cardiology Dr. Harl Bowie  Orthopedic Surgery Dr. Sharol Given   Procedures:  None   Antimicrobials: Anti-infectives (From admission, onward)   None     Subjective: Seen and examined and had no pain. States he tripped over walker and fell on the cement. No CP or SOB. No nausea or vomiting and stated he did not have any pain currently.   Objective: Vitals:   12/02/17 2015 12/02/17 2045 12/02/17 2210 12/03/17 0522  BP: 119/66 139/79 123/67 (!) 120/53  Pulse: 90 82 79 81  Resp: 20 (!) _0 Temp:   97.6 F (36.4 C) (!) 97.5 F (36.4 C)  TempSrc:   Oral Oral  SpO2: 95% 97% 96% 97%  Weight:      Height:        Intake/Output Summary (Last 24 hours) at 12/03/2017 1358 Last data filed at 12/03/2017 0500 Gross per 24 hour  Intake -  Output 250 ml  Net -250 ml   Filed Weights   12/02/17 1821  Weight: 106.1 kg (234 lb)   Examination: Physical Exam:  Constitutional: WN/WD Caucasian male in NAD and appears calm Eyes: Lids and conjunctivae normal, sclerae anicteric  ENMT: External Ears, Nose appear normal. Grossly normal hearing. Mucous membranes are moist. Posterior pharynx clear of any exudate or lesions.  normal ROM, no appreciable thyromegaly, no JVD Respiratory: Diminished to  auscultation bilaterally, no wheezing, rales, rhonchi or crackles. Normal respiratory effort and patient is not tachypenic. No accessory muscle use.  Cardiovascular: RRR, no murmurs / rubs / gallops. S1 and S2 auscultated. Mild LE edema Abdomen: Soft, non-tender, non-distended. No masses palpated. No appreciable hepatosplenomegaly. Bowel sounds positive x4.  GU: Deferred. Musculoskeletal: Right leg externally rotated and slightly shorter; No clubbing; no cyanosis Skin: No rashes appreciated on a limited skin eval but has had multiple skin lesions. No induration; Warm and dry.  Neurologic: CN 2-12 grossly intact with no focal deficits. Romberg sign and cerebellar reflexes not assessed.  Psychiatric: Normal judgment and insight. Alert and awake. Normal mood and appropriate affect.   Data Reviewed: I have personally reviewed following labs and imaging studies  CBC: Recent Labs  Lab 12/02/17 1823 12/03/17 0339  WBC 10.9* 10.6*  NEUTROABS 7.7  --   HGB 15.3 13.7  HCT 46.5 41.9  MCV 91.4 91.9  PLT 171 749   Basic Metabolic Panel: Recent Labs  Lab 12/02/17 1823 12/03/17 0339  NA 134* 135  K 4.4 5.0  CL 102 105  CO2 20* 22  GLUCOSE 251* 300*  BUN 37* 38*  CREATININE 2.26* 2.25*  CALCIUM 8.7* 8.6*   GFR: Estimated Creatinine Clearance: 30.3 mL/min (A) (by C-G formula based on SCr of 2.25 mg/dL (H)). Liver Function Tests: No results for input(s): AST, ALT, ALKPHOS, BILITOT, PROT, ALBUMIN in the last 168 hours. No results for input(s): LIPASE, AMYLASE in the last 168 hours. No results for input(s): AMMONIA in the last 168 hours. Coagulation Profile: Recent Labs  Lab 12/02/17 1823  INR 1.02   Cardiac Enzymes: No results for input(s): CKTOTAL, CKMB, CKMBINDEX, TROPONINI in the last 168 hours. BNP (last 3 results) Recent Labs    03/07/17 1255  PROBNP 379.0*   HbA1C: No results for input(s): HGBA1C in the last 72 hours. CBG: Recent Labs  Lab 12/02/17 2218 12/03/17 0653  12/03/17 1217  GLUCAP 242* 283* 226*   Lipid Profile: No results for input(s): CHOL, HDL, LDLCALC, TRIG, CHOLHDL, LDLDIRECT in the last 72 hours. Thyroid Function Tests: No results for input(s): TSH, T4TOTAL, FREET4, T3FREE, THYROIDAB in the last 72 hours. Anemia Panel: No results for input(s): VITAMINB12, FOLATE, FERRITIN, TIBC, IRON, RETICCTPCT in the last 72 hours. Sepsis Labs: No results for input(s): PROCALCITON, LATICACIDVEN in the last 168 hours.  No results found for this or any previous visit (from the past 240 hour(s)).   Radiology Studies: Dg Chest 1 View  Result Date: 12/02/2017 CLINICAL DATA:  Patient status post fall. EXAM: CHEST 1 VIEW COMPARISON:  Chest radiograph 01/13/2017. FINDINGS: Multi lead pacer apparatus overlies the left hemithorax. Interval enlargement cardiac and mediastinal contours. Tortuosity of the thoracic aorta. Low lung volumes. Bibasilar atelectasis. No pleural effusion or pneumothorax. IMPRESSION: The cardiac and mediastinal contours appear enlarged when compared to recent prior exam which may be secondary to portable low volume technique. Pericardial effusion or other mediastinal process is not excluded. Recommend further evaluation with PA and lateral chest radiograph. Low lung volumes with basilar atelectasis. Electronically Signed   By: Lovey Newcomer M.D.   On: 12/02/2017 19:10   Dg Chest 2 View  Result Date: 12/02/2017 CLINICAL DATA:  Fall. Previous portable study recommended repeat upright and lateral views for better evaluation of the patient. EXAM: CHEST  2 VIEW COMPARISON:  Portable chest 12/02/2017 FINDINGS: Cardiac pacemaker. Shallow inspiration. Cardiac enlargement. No pulmonary vascular congestion or edema. No focal consolidation. Calcified and tortuous aorta. Ribs mediastinal prominence seen previously is improved. This likely represented positional changes. No pneumothorax. No blunting of costophrenic angles. Calcification of the aorta.  Degenerative changes in the spine. IMPRESSION: Shallow inspiration. Cardiac enlargement. No edema or consolidation. Aortic atherosclerosis. Electronically Signed   By: Lucienne Capers M.D.   On: 12/02/2017 22:29   Ct Head Wo Contrast  Result Date: 12/02/2017 CLINICAL DATA:  Per ed notes: Pt brought here by wife from home after tripping in yard while using walker EXAM: CT HEAD WITHOUT CONTRAST CT CERVICAL SPINE WITHOUT CONTRAST TECHNIQUE: Multidetector CT imaging of the head and cervical spine was performed following the standard protocol without intravenous contrast. Multiplanar CT image reconstructions of the cervical spine were also generated. COMPARISON:  11/22/2017 FINDINGS: CT HEAD FINDINGS Brain: There is mild central and cortical atrophy. Periventricular white matter changes are consistent with small vessel disease. There is no intra or extra-axial fluid collection or mass lesion. The basilar cisterns and ventricles have a normal appearance. There is no CT evidence for acute infarction or hemorrhage. Vascular: There is atherosclerotic calcification of the carotid siphons. Skull: Normal. Negative for fracture or focal lesion. Sinuses/Orbits: There is opacification of the left maxillary sinus. No sinus wall  fracture imaged. Other: None CT CERVICAL SPINE FINDINGS Alignment: There is reversal of the normal cervical lordosis which may be secondary to splinting, positioning, or soft tissue injury. Skull base and vertebrae: No acute fracture. No primary bone lesion or focal pathologic process. Soft tissues and spinal canal: No prevertebral fluid or swelling. No visible canal hematoma. Disc levels:  Mild disc height loss primarily at C5-6 the C6-7. Upper chest: Remote left clavicle fracture. There is atherosclerotic calcification of the thoracic aorta. Left-sided pacemaker. Other: None IMPRESSION: 1.  No evidence for acute intracranial abnormality. 2. Atrophy and small vessel disease. 3. Opacification of the left  maxillary sinus without imaged fracture. 4. Cervical spine degenerative changes without acute fracture. Electronically Signed   By: Nolon Nations M.D.   On: 12/02/2017 20:11   Ct Cervical Spine Wo Contrast  Result Date: 12/02/2017 CLINICAL DATA:  Per ed notes: Pt brought here by wife from home after tripping in yard while using walker EXAM: CT HEAD WITHOUT CONTRAST CT CERVICAL SPINE WITHOUT CONTRAST TECHNIQUE: Multidetector CT imaging of the head and cervical spine was performed following the standard protocol without intravenous contrast. Multiplanar CT image reconstructions of the cervical spine were also generated. COMPARISON:  11/22/2017 FINDINGS: CT HEAD FINDINGS Brain: There is mild central and cortical atrophy. Periventricular white matter changes are consistent with small vessel disease. There is no intra or extra-axial fluid collection or mass lesion. The basilar cisterns and ventricles have a normal appearance. There is no CT evidence for acute infarction or hemorrhage. Vascular: There is atherosclerotic calcification of the carotid siphons. Skull: Normal. Negative for fracture or focal lesion. Sinuses/Orbits: There is opacification of the left maxillary sinus. No sinus wall fracture imaged. Other: None CT CERVICAL SPINE FINDINGS Alignment: There is reversal of the normal cervical lordosis which may be secondary to splinting, positioning, or soft tissue injury. Skull base and vertebrae: No acute fracture. No primary bone lesion or focal pathologic process. Soft tissues and spinal canal: No prevertebral fluid or swelling. No visible canal hematoma. Disc levels:  Mild disc height loss primarily at C5-6 the C6-7. Upper chest: Remote left clavicle fracture. There is atherosclerotic calcification of the thoracic aorta. Left-sided pacemaker. Other: None IMPRESSION: 1.  No evidence for acute intracranial abnormality. 2. Atrophy and small vessel disease. 3. Opacification of the left maxillary sinus without  imaged fracture. 4. Cervical spine degenerative changes without acute fracture. Electronically Signed   By: Nolon Nations M.D.   On: 12/02/2017 20:11   Dg Hip Unilat  With Pelvis 2-3 Views Right  Result Date: 12/02/2017 CLINICAL DATA:  Patient reports a fall today.  Pain. EXAM: DG HIP (WITH OR WITHOUT PELVIS) 2-3V RIGHT COMPARISON:  01/24/2016 FINDINGS: Bones are diffusely demineralized. Comminuted intertrochanteric right femoral neck fracture with varus angulation evident. Lesser trochanter exists as a free fragment. Cross-table lateral films are nondiagnostic. SI joints and symphysis pubis unremarkable. Penile prosthetic device evident. IMPRESSION: Comminuted intertrochanteric right femoral neck fracture with varus angulation. Electronically Signed   By: Misty Stanley M.D.   On: 12/02/2017 19:09   Scheduled Meds: . aspirin EC  81 mg Oral QHS  . enoxaparin (LOVENOX) injection  40 mg Subcutaneous Q24H  . insulin aspart  0-20 Units Subcutaneous TID WC  . insulin aspart  0-5 Units Subcutaneous QHS  . insulin glargine  25 Units Subcutaneous Daily  . isosorbide mononitrate  30 mg Oral Daily  . rosuvastatin  20 mg Oral QHS  . senna  1 tablet Oral BID  .  sertraline  25 mg Oral Daily  . tamsulosin  0.4 mg Oral Daily   Continuous Infusions:   LOS: 1 day   Kerney Elbe, DO [facility] Pager [phone]  If 7PM-7AM, please contact night-coverage [email or web address] Password [email or web address] 12/03/2017, 1:58 PM

## 2017-12-04 ENCOUNTER — Encounter (HOSPITAL_COMMUNITY)
Admission: EM | Disposition: A | Discharge status: Skilled Nursing Facility | Payer: Self-pay | Source: Home / Self Care | Attending: Internal Medicine

## 2017-12-04 ENCOUNTER — Inpatient Hospital Stay (HOSPITAL_COMMUNITY): Payer: Medicare Other

## 2017-12-04 ENCOUNTER — Encounter (HOSPITAL_COMMUNITY): Payer: Self-pay | Admitting: *Deleted

## 2017-12-04 ENCOUNTER — Inpatient Hospital Stay (HOSPITAL_COMMUNITY): Payer: Medicare Other | Admitting: Certified Registered Nurse Anesthetist

## 2017-12-04 DIAGNOSIS — I5032 Chronic diastolic (congestive) heart failure: Secondary | ICD-10-CM

## 2017-12-04 DIAGNOSIS — W19XXXS Unspecified fall, sequela: Secondary | ICD-10-CM

## 2017-12-04 HISTORY — PX: INTRAMEDULLARY (IM) NAIL INTERTROCHANTERIC: SHX5875

## 2017-12-04 LAB — CBC WITH DIFFERENTIAL/PLATELET
Basophils Absolute: 0 10*3/uL (ref 0.0–0.1)
Basophils Relative: 0 %
EOS ABS: 0.1 10*3/uL (ref 0.0–0.7)
EOS PCT: 1 %
HCT: 38.1 % — ABNORMAL LOW (ref 39.0–52.0)
Hemoglobin: 12.4 g/dL — ABNORMAL LOW (ref 13.0–17.0)
Lymphocytes Relative: 25 %
Lymphs Abs: 2.3 10*3/uL (ref 0.7–4.0)
MCH: 30 pg (ref 26.0–34.0)
MCHC: 32.5 g/dL (ref 30.0–36.0)
MCV: 92 fL (ref 78.0–100.0)
MONOS PCT: 11 %
Monocytes Absolute: 1 10*3/uL (ref 0.1–1.0)
NEUTROS ABS: 6 10*3/uL (ref 1.7–7.7)
NEUTROS PCT: 63 %
PLATELETS: 122 10*3/uL — AB (ref 150–400)
RBC: 4.14 MIL/uL — ABNORMAL LOW (ref 4.22–5.81)
RDW: 13.5 % (ref 11.5–15.5)
WBC: 9.4 10*3/uL (ref 4.0–10.5)

## 2017-12-04 LAB — GLUCOSE, CAPILLARY
GLUCOSE-CAPILLARY: 93 mg/dL (ref 65–99)
GLUCOSE-CAPILLARY: 125 mg/dL — AB (ref 65–99)
GLUCOSE-CAPILLARY: 191 mg/dL — AB (ref 65–99)
Glucose-Capillary: 128 mg/dL — ABNORMAL HIGH (ref 65–99)

## 2017-12-04 LAB — COMPREHENSIVE METABOLIC PANEL
ALT: 12 U/L — ABNORMAL LOW (ref 17–63)
ANION GAP: 6 (ref 5–15)
AST: 22 U/L (ref 15–41)
Albumin: 2.9 g/dL — ABNORMAL LOW (ref 3.5–5.0)
Alkaline Phosphatase: 29 U/L — ABNORMAL LOW (ref 38–126)
BUN: 32 mg/dL — ABNORMAL HIGH (ref 6–20)
CHLORIDE: 108 mmol/L (ref 101–111)
CO2: 23 mmol/L (ref 22–32)
Calcium: 8.5 mg/dL — ABNORMAL LOW (ref 8.9–10.3)
Creatinine, Ser: 2 mg/dL — ABNORMAL HIGH (ref 0.61–1.24)
GFR, EST AFRICAN AMERICAN: 34 mL/min — AB (ref >60)
GFR, EST NON AFRICAN AMERICAN: 29 mL/min — AB (ref >60)
Glucose, Bld: 101 mg/dL — ABNORMAL HIGH (ref 65–99)
POTASSIUM: 4 mmol/L (ref 3.5–5.1)
SODIUM: 137 mmol/L (ref 135–145)
Total Bilirubin: 0.8 mg/dL (ref 0.3–1.2)
Total Protein: 6.3 g/dL — ABNORMAL LOW (ref 6.5–8.1)

## 2017-12-04 LAB — MAGNESIUM: MAGNESIUM: 2.2 mg/dL (ref 1.7–2.4)

## 2017-12-04 LAB — PHOSPHORUS: PHOSPHORUS: 2.8 mg/dL (ref 2.5–4.6)

## 2017-12-04 SURGERY — FIXATION, FRACTURE, INTERTROCHANTERIC, WITH INTRAMEDULLARY ROD
Anesthesia: General | Laterality: Right

## 2017-12-04 MED ORDER — PHENYLEPHRINE 40 MCG/ML (10ML) SYRINGE FOR IV PUSH (FOR BLOOD PRESSURE SUPPORT)
PREFILLED_SYRINGE | INTRAVENOUS | Status: AC
  Filled 2017-12-04: qty 20

## 2017-12-04 MED ORDER — EPHEDRINE SULFATE-NACL 50-0.9 MG/10ML-% IV SOSY
PREFILLED_SYRINGE | INTRAVENOUS | Status: DC | PRN
  Administered 2017-12-04: 10 mg via INTRAVENOUS

## 2017-12-04 MED ORDER — MEPERIDINE HCL 25 MG/ML IJ SOLN: 6.2500 mg | INTRAMUSCULAR | Status: DC | PRN

## 2017-12-04 MED ORDER — FENTANYL CITRATE (PF) 100 MCG/2ML IJ SOLN: 25.0000 ug | INTRAMUSCULAR | Status: DC | PRN

## 2017-12-04 MED ORDER — CLINDAMYCIN PHOSPHATE 900 MG/50ML IV SOLN
900.0000 mg | INTRAVENOUS | Status: AC
  Administered 2017-12-04: 900 mg via INTRAVENOUS
  Filled 2017-12-04: qty 50

## 2017-12-04 MED ORDER — ASPIRIN EC 325 MG PO TBEC: 325.0000 mg | DELAYED_RELEASE_TABLET | Freq: Every day | ORAL | 0 refills | Status: DC

## 2017-12-04 MED ORDER — FENTANYL CITRATE (PF) 250 MCG/5ML IJ SOLN
INTRAMUSCULAR | Status: AC
  Filled 2017-12-04: qty 5

## 2017-12-04 MED ORDER — SODIUM CHLORIDE 0.9 % IV SOLN
INTRAVENOUS | Status: DC
  Administered 2017-12-04 – 2017-12-08 (×2): via INTRAVENOUS

## 2017-12-04 MED ORDER — ASPIRIN EC 325 MG PO TBEC
325.0000 mg | DELAYED_RELEASE_TABLET | Freq: Every day | ORAL | Status: DC
  Administered 2017-12-05 – 2017-12-08 (×4): 325 mg via ORAL
  Filled 2017-12-04 (×4): qty 1

## 2017-12-04 MED ORDER — ONDANSETRON HCL 4 MG/2ML IJ SOLN: 4.0000 mg | Freq: Four times a day (QID) | INTRAMUSCULAR | Status: DC | PRN

## 2017-12-04 MED ORDER — ROCURONIUM BROMIDE 10 MG/ML (PF) SYRINGE
PREFILLED_SYRINGE | INTRAVENOUS | Status: DC | PRN
  Administered 2017-12-04: 50 mg via INTRAVENOUS

## 2017-12-04 MED ORDER — DEXAMETHASONE SODIUM PHOSPHATE 10 MG/ML IJ SOLN
INTRAMUSCULAR | Status: AC
  Filled 2017-12-04: qty 3

## 2017-12-04 MED ORDER — LACTATED RINGERS IV SOLN
INTRAVENOUS | Status: DC | PRN
  Administered 2017-12-04: 16:00:00 via INTRAVENOUS

## 2017-12-04 MED ORDER — MENTHOL 3 MG MT LOZG: 1.0000 | LOZENGE | OROMUCOSAL | Status: DC | PRN

## 2017-12-04 MED ORDER — ONDANSETRON HCL 4 MG/2ML IJ SOLN
INTRAMUSCULAR | Status: DC | PRN
  Administered 2017-12-04: 4 mg via INTRAVENOUS

## 2017-12-04 MED ORDER — EPHEDRINE 5 MG/ML INJ
INTRAVENOUS | Status: AC
  Filled 2017-12-04: qty 20

## 2017-12-04 MED ORDER — 0.9 % SODIUM CHLORIDE (POUR BTL) OPTIME
TOPICAL | Status: DC | PRN
  Administered 2017-12-04: 1000 mL

## 2017-12-04 MED ORDER — HYDROCODONE-ACETAMINOPHEN 5-325 MG PO TABS
1.0000 | ORAL_TABLET | Freq: Four times a day (QID) | ORAL | Status: DC | PRN
  Administered 2017-12-05 – 2017-12-08 (×6): 2 via ORAL
  Filled 2017-12-04 (×6): qty 2

## 2017-12-04 MED ORDER — ACETAMINOPHEN 650 MG RE SUPP: 650.0000 mg | Freq: Four times a day (QID) | RECTAL | Status: DC | PRN

## 2017-12-04 MED ORDER — ONDANSETRON HCL 4 MG/2ML IJ SOLN
INTRAMUSCULAR | Status: AC
  Filled 2017-12-04: qty 8

## 2017-12-04 MED ORDER — CHLORHEXIDINE GLUCONATE 4 % EX LIQD: 60.0000 mL | Freq: Once | CUTANEOUS | Status: DC

## 2017-12-04 MED ORDER — ONDANSETRON HCL 4 MG PO TABS: 4.0000 mg | ORAL_TABLET | Freq: Four times a day (QID) | ORAL | Status: DC | PRN

## 2017-12-04 MED ORDER — SUGAMMADEX SODIUM 200 MG/2ML IV SOLN
INTRAVENOUS | Status: DC | PRN
  Administered 2017-12-04 (×2): 100 mg via INTRAVENOUS

## 2017-12-04 MED ORDER — FENTANYL CITRATE (PF) 250 MCG/5ML IJ SOLN
INTRAMUSCULAR | Status: DC | PRN
  Administered 2017-12-04: 50 ug via INTRAVENOUS

## 2017-12-04 MED ORDER — PROPOFOL 10 MG/ML IV BOLUS
INTRAVENOUS | Status: DC | PRN
  Administered 2017-12-04: 80 mg via INTRAVENOUS

## 2017-12-04 MED ORDER — ROCURONIUM BROMIDE 10 MG/ML (PF) SYRINGE
PREFILLED_SYRINGE | INTRAVENOUS | Status: AC
  Filled 2017-12-04: qty 15

## 2017-12-04 MED ORDER — ACETAMINOPHEN 325 MG PO TABS: 650.0000 mg | ORAL_TABLET | Freq: Four times a day (QID) | ORAL | Status: DC | PRN

## 2017-12-04 MED ORDER — METOCLOPRAMIDE HCL 5 MG PO TABS: 5.0000 mg | ORAL_TABLET | Freq: Three times a day (TID) | ORAL | Status: DC | PRN

## 2017-12-04 MED ORDER — PROMETHAZINE HCL 25 MG/ML IJ SOLN: 6.2500 mg | INTRAMUSCULAR | Status: DC | PRN

## 2017-12-04 MED ORDER — DEXAMETHASONE SODIUM PHOSPHATE 10 MG/ML IJ SOLN
INTRAMUSCULAR | Status: DC | PRN
  Administered 2017-12-04: 5 mg via INTRAVENOUS

## 2017-12-04 MED ORDER — POVIDONE-IODINE 10 % EX SWAB: 2.0000 "application " | Freq: Once | CUTANEOUS | Status: DC

## 2017-12-04 MED ORDER — PHENYLEPHRINE 40 MCG/ML (10ML) SYRINGE FOR IV PUSH (FOR BLOOD PRESSURE SUPPORT)
PREFILLED_SYRINGE | INTRAVENOUS | Status: DC | PRN
  Administered 2017-12-04 (×5): 80 ug via INTRAVENOUS

## 2017-12-04 MED ORDER — ACETAMINOPHEN 500 MG PO TABS: 500.0000 mg | ORAL_TABLET | Freq: Four times a day (QID) | ORAL | 0 refills | Status: AC | PRN

## 2017-12-04 MED ORDER — SUCCINYLCHOLINE CHLORIDE 200 MG/10ML IV SOSY
PREFILLED_SYRINGE | INTRAVENOUS | Status: AC
  Filled 2017-12-04: qty 10

## 2017-12-04 MED ORDER — METOCLOPRAMIDE HCL 5 MG/ML IJ SOLN: 5.0000 mg | Freq: Three times a day (TID) | INTRAMUSCULAR | Status: DC | PRN

## 2017-12-04 MED ORDER — LIDOCAINE 2% (20 MG/ML) 5 ML SYRINGE
INTRAMUSCULAR | Status: AC
  Filled 2017-12-04: qty 15

## 2017-12-04 MED ORDER — LIDOCAINE 2% (20 MG/ML) 5 ML SYRINGE
INTRAMUSCULAR | Status: DC | PRN
  Administered 2017-12-04: 100 mg via INTRAVENOUS

## 2017-12-04 MED ORDER — CLINDAMYCIN PHOSPHATE 600 MG/50ML IV SOLN
600.0000 mg | Freq: Four times a day (QID) | INTRAVENOUS | Status: AC
  Administered 2017-12-04 – 2017-12-05 (×2): 600 mg via INTRAVENOUS
  Filled 2017-12-04 (×2): qty 50

## 2017-12-04 MED ORDER — PHENOL 1.4 % MT LIQD: 1.0000 | OROMUCOSAL | Status: DC | PRN

## 2017-12-04 SURGICAL SUPPLY — 40 items
BIT DRILL FLUTED FEMUR 4.2/3 (BIT) ×2 IMPLANT
BLADE SURG 15 STRL LF DISP TIS (BLADE) ×1 IMPLANT
BLADE SURG 15 STRL SS (BLADE) ×3
BLADE TFNA HELICAL 115 (Anchor) ×1 IMPLANT
BLADE TFNA HELICAL 115MM (Anchor) ×1 IMPLANT
BNDG COHESIVE 6X5 TAN STRL LF (GAUZE/BANDAGES/DRESSINGS) ×4 IMPLANT
COVER BACK TABLE 60X90IN (DRAPES) ×1 IMPLANT
COVER PERINEAL POST (MISCELLANEOUS) ×3 IMPLANT
COVER SURGICAL LIGHT HANDLE (MISCELLANEOUS) ×6 IMPLANT
DRAPE C-ARM 42X72 X-RAY (DRAPES) ×3 IMPLANT
DRAPE STERI IOBAN 125X83 (DRAPES) ×3 IMPLANT
DRSG ADAPTIC 3X8 NADH LF (GAUZE/BANDAGES/DRESSINGS) ×1 IMPLANT
DRSG MEPILEX BORDER 4X4 (GAUZE/BANDAGES/DRESSINGS) ×3 IMPLANT
DRSG MEPILEX BORDER 4X8 (GAUZE/BANDAGES/DRESSINGS) ×3 IMPLANT
ELECT REM PT RETURN 9FT ADLT (ELECTROSURGICAL)
ELECTRODE REM PT RTRN 9FT ADLT (ELECTROSURGICAL) ×1 IMPLANT
EVACUATOR 1/8 PVC DRAIN (DRAIN) IMPLANT
GLOVE BIOGEL PI IND STRL 7.0 (GLOVE) IMPLANT
GLOVE BIOGEL PI IND STRL 9 (GLOVE) ×1 IMPLANT
GLOVE BIOGEL PI INDICATOR 7.0 (GLOVE) ×4
GLOVE BIOGEL PI INDICATOR 9 (GLOVE) ×2
GLOVE SURG ORTHO 9.0 STRL STRW (GLOVE) ×3 IMPLANT
GLOVE SURG SS PI 6.5 STRL IVOR (GLOVE) ×4 IMPLANT
GOWN STRL REUS W/ TWL LRG LVL3 (GOWN DISPOSABLE) IMPLANT
GOWN STRL REUS W/ TWL XL LVL3 (GOWN DISPOSABLE) ×3 IMPLANT
GOWN STRL REUS W/TWL LRG LVL3 (GOWN DISPOSABLE) ×6
GOWN STRL REUS W/TWL XL LVL3 (GOWN DISPOSABLE) ×3
GUIDEWIRE 3.2X400 (WIRE) ×4 IMPLANT
KIT BASIN OR (CUSTOM PROCEDURE TRAY) ×3 IMPLANT
KIT ROOM TURNOVER OR (KITS) ×3 IMPLANT
LINER BOOT UNIVERSAL DISP (MISCELLANEOUS) ×1 IMPLANT
MANIFOLD NEPTUNE II (INSTRUMENTS) ×1 IMPLANT
NAIL TROCH FIX 10X170 130 (Nail) ×2 IMPLANT
NS IRRIG 1000ML POUR BTL (IV SOLUTION) ×3 IMPLANT
PACK GENERAL/GYN (CUSTOM PROCEDURE TRAY) ×3 IMPLANT
PAD ARMBOARD 7.5X6 YLW CONV (MISCELLANEOUS) ×8 IMPLANT
SCREW LOCKING 5.0X34MM (Screw) ×2 IMPLANT
STAPLER VISISTAT 35W (STAPLE) IMPLANT
SUT VIC AB 2-0 CTB1 (SUTURE) IMPLANT
WATER STERILE IRR 1000ML POUR (IV SOLUTION) ×2 IMPLANT

## 2017-12-04 NOTE — Progress Notes (Signed)
RT Note: RT in to talk to patient about CPAP. Patient confused. Unable to answer questions regarding CPAP.  Per RN & CNA patient unable to answer appropriately at this time.CPAP not placed at this time.

## 2017-12-04 NOTE — Progress Notes (Signed)
Patient ID: Frank Simpson, male   DOB: 10-01-1931, 84 y.o.   MRN: [id number] Patient resting comfortably.  Plan for intramedullary fixation for right intertrochanteric hip fracture this afternoon around 4 PM.  Patient's wife will need to sign his permanent.

## 2017-12-04 NOTE — Progress Notes (Signed)
Please see Dr Nelly Laurence preop note. Pt cleared for surgery from cardiac perspective. Will FU tomorrow am post-operatively to make sure he hasn't had any cardiac-related problems with surgery.   Sherren Mocha 12/04/2017 11:45 AM

## 2017-12-04 NOTE — Progress Notes (Signed)
Initial Nutrition Assessment  DOCUMENTATION CODES:   Obesity unspecified  INTERVENTION:    Advance diet as medically appropriate   RD to add oral nutrition supplements as needed  NUTRITION DIAGNOSIS:   Increased nutrient needs related to hip fracture as evidenced by estimated needs  GOAL:   Patient will meet greater than or equal to 90% of their needs   MONITOR:   Diet advancement, PO intake, Supplement acceptance, Labs, Weight trends, Skin  REASON FOR ASSESSMENT:   Consult Assessment of nutrition requirement/status  ASSESSMENT:    84 y.o. Male with medical history significant of HTN, MI, CAD s/p DES to LAD, Diastolic dysfunction last EF 55-60% with grade 2 dysfunction, CHB s/p PPM, DM type II with peripheral neuropathy, Basal cell skin cancer s/p removal, syncope, and recurrent falls with previous SDH in 10/2016; who presents after having a mechanical fall at home reporting excruciating right leg pain.   Pt was found to have acute femoral neck fracture. He is currently NPO for surgery this afternoon. No nutrition problems identified PTA.  Weight has been stable per readings below. Labs and medications reviewed. CBG's [phone].  NUTRITION - FOCUSED PHYSICAL EXAM:  Unable to complete at this time.   Diet Order:  Diet NPO time specified  EDUCATION NEEDS:   No education needs have been identified at this time  Skin:  Skin Assessment: Reviewed RN Assessment  Last BM:  N/A  Height:   Ht Readings from Last 1 Encounters:  12/02/17 5' 11" (1.803 m)    Weight:   Wt Readings from Last 1 Encounters:  12/04/17 235 lb 14.3 oz (107 kg)   Wt Readings from Last 10 Encounters:  12/04/17 235 lb 14.3 oz (107 kg)  11/14/17 234 lb 8 oz (106.4 kg)  11/08/17 225 lb (102.1 kg)  07/10/17 232 lb 8 oz (105.5 kg)  05/18/17 235 lb 3.2 oz (106.7 kg)  03/17/17 239 lb 12 oz (108.7 kg)  03/07/17 239 lb (108.4 kg)  01/17/17 234 lb 1.9 oz (106.2 kg)  01/13/17 237 lb (107.5  kg)  12/16/16 235 lb (106.6 kg)   Ideal Body Weight:  78.1 kg  BMI:  Body mass index is 32.9 kg/m.  Estimated Nutritional Needs:   Kcal:     Protein:     Fluid:     Arthur Holms, RD, LDN Pager #: [phone] After-Hours Pager #: [phone]

## 2017-12-04 NOTE — Op Note (Signed)
DATE OF SURGERY:  12/04/2017  TIME: 5:35 PM  PATIENT NAME:  Seann L Viscardi     AGE: 84 y.o.  PRE-OPERATIVE DIAGNOSIS:  right intertroch fx  POST-OPERATIVE DIAGNOSIS:  SAME  PROCEDURE:  INTRAMEDULLARY (IM) NAIL INTERTROCHANTRIC  With a Synthes 10 x 170 mm nail with a 115 mm spiral blade and a 34 mm screw. C-arm fluoroscopy total fluoroscopy time 14 mGy SURGEON:  Newt Minion  ASSISTANT:  none.  OPERATIVE IMPLANTS: Synthes trochanteric femoral nail with interlocking helical blade, locked proximally and distally.  PREOPERATIVE INDICATIONS:  Gerardo ANGELICA WIX is a 84 y.o. year old who fell and suffered a hip fracture. He was brought into the ER and then admitted and optimized and then elected for surgical intervention.    The risks benefits and alternatives were discussed with the patient including but not limited to the risks of nonoperative treatment, versus surgical intervention including infection, bleeding, nerve injury, malunion, nonunion, hardware prominence, hardware failure, need for hardware removal, blood clots, cardiopulmonary complications, morbidity, mortality, among others, and they were willing to proceed.    OPERATIVE PROCEDURE:  The patient was brought to the operating room and placed in the supine position on the [place] fracture table. General anesthesia was administered. He was placed on the fracture table.  Closed reduction was performed under C-arm guidance. Time out was then performed after sterile prep with Duraprep and draped into a sterile field with the shower curtain. He received preoperative antibiotics.  Incision was made proximal to the greater trochanter. A guidewire was placed centered on the greater trochanter. Confirmation was made on AP and lateral views. The femoral canal was then reamed over the wire. Wire and reamer were removed and the nail was inserted.  A guidepin was placed through the jig into the femoral head close to the center center position of the  femoral head. The helical screw length was measured the cortex was reamed and the helical screw was inserted. C-arm was used to verify position of the spiral blade.  The compression screw proximally lock the spiral blade.  The distal interlock was placed using the jig.  Instruments were removed and final C-arm pictures AP and lateral were obtained.  The wounds were irrigated  and closed with Vicryl followed by staples and Mepilex dressing. The patient was awakened and returned to PACU in stable and satisfactory condition. There no complications. Weightbearing as tolerated.  Newt Minion

## 2017-12-04 NOTE — Anesthesia Preprocedure Evaluation (Addendum)
Anesthesia Evaluation  Patient identified by MRN, date of birth, ID band Patient awake    Reviewed: Allergy & Precautions, NPO status , Patient's Chart, lab work & pertinent test results  Airway Mallampati: II  TM Distance: >3 FB Neck ROM: Full    Dental no notable dental hx.    Pulmonary neg pulmonary ROS,    Pulmonary exam normal breath sounds clear to auscultation       Cardiovascular hypertension, + CAD, + Past MI and +CHF  Normal cardiovascular exam+ pacemaker  Rhythm:Regular Rate:Normal  Echo - Left ventricle: The cavity size was normal. There was moderate asymmetric hypertrophy of the septum. Systolic function was normal. The estimated ejection fraction was in the range of 60% to 65%. Although no diagnostic regional wall motion abnormality was identified, this possibility cannot be completely excluded of the basis of this study. Doppler parameters are consistent with abnormal left ventricular relaxation (grade 1 diastolic dysfunction).   Pacer Check 09/2017 AP VP: 18.0 %, AP VS: 12.0 %, AS VP: 25.0 %, AS VS: 43.0 %   Neuro/Psych  Headaches, PSYCHIATRIC DISORDERS Depression  Neuromuscular disease negative neurological ROS  negative psych ROS   GI/Hepatic negative GI ROS, Neg liver ROS,   Endo/Other  negative endocrine ROSdiabetes, Type 2, Insulin Dependent  Renal/GU Renal InsufficiencyRenal diseasenegative Renal ROS  negative genitourinary   Musculoskeletal negative musculoskeletal ROS (+) Arthritis ,   Abdominal   Peds negative pediatric ROS (+)  Hematology negative hematology ROS (+)   Anesthesia Other Findings   Reproductive/Obstetrics negative OB ROS                          Anesthesia Physical Anesthesia Plan  ASA: III  Anesthesia Plan: General   Post-op Pain Management:    Induction: Intravenous  PONV Risk Score and Plan: 3 and Ondansetron and Treatment may vary due to age  or medical condition  Airway Management Planned: Oral ETT  Additional Equipment: None  Intra-op Plan:   Post-operative Plan: Extubation in OR  Informed Consent: I have reviewed the patients History and Physical, chart, labs and discussed the procedure including the risks, benefits and alternatives for the proposed anesthesia with the patient or authorized representative who has indicated his/her understanding and acceptance.   Dental advisory given  Plan Discussed with: CRNA  Anesthesia Plan Comments:        Anesthesia Quick Evaluation

## 2017-12-04 NOTE — Progress Notes (Signed)
PROGRESS NOTE    Frank Simpson  YWV:[id number] DOB: [birth year] DOA: 12/02/2017 PCP: [name], MD  Brief Narrative: Frank Simpson is a 84 y.o. male with medical history significant of HTN, MI, CAD s/p DES to LAD, Diastolic dysfunction last EF 55-60% with grade 2 dysfunction, CHB s/p PPM, DM type II with peripheral neuropathy, Basal cell skin cancer s/p removal, syncope, and recurrent falls with previous SDH in 10/2016; who presents after having a mechanical fall at home reporting excruciating right leg pain.  Patient notes that he tripped over his walker which he normally uses at baseline to ambulate.  He has a long history of falls, and been noted to be progressively getting weaker.  He states that he did hit his head when he fell, but did not lose consciousness with this fall (notes loss of consciousness with previous falls). Denies having any  fever, chills, nausea, or vomiting symptoms.  Associated symptoms include complaints of headaches from sitting up and joint pains. CT scan of the brain and neck showed no acute intracranial or cervical abnormalities.  X-ray of the hip show a communicated intertrochanteric femoral neck fracture.  Patient was given 25 mcg of fentanyl initially for pain and subsequently 1 mg of Dilaudid.  Dr. Sharol Given of Orthopedics was consulted and plan on surgical repair later today 12/04/16. Cardiology was consulted for pre-operative clearance.   Assessment & Plan:   Principal Problem:   Fracture of femoral neck, right (HCC) Active Problems:   Hypercholesterolemia   CAD (coronary artery disease)   Diabetes mellitus type 2 with retinopathy (HCC)   Frequent falls   Chronic diastolic CHF (congestive heart failure) (HCC)   Leukocytosis   Closed intertrochanteric fracture of hip, right, initial encounter [facility])   Preoperative cardiovascular examination  Fall with Communicated intertrochanteric right femoral neck fracture:  -Acute.  Patient tripped over walker and  presents with right leg pain.   -Found to have acute femoral neck fracture.  -Revised cardiac risk assessment score is 5( Cr>2, DM tx w/ insulin, H/O MI, CVA, SDH, and CHF) which would mean patient is greater than 11% risk for major cardiac event.  -Admitted to telemetry bed -Hip fracture order set was initiated -C/w Pain Control with Fentanyl 12.5-25 mcg every 3 hours prn  -Cardiology consulted for Pre-operative Risk Assessment and feel his at increased risk but not prohibitive for surgery and they do not recommend any additional cardiac testing prior to Surgery -Orthopedic Surgery Dr. Sharol Given evaluated and is planning for Surgery today, 12/04/17 around 4:00 pm   Chronic Kidney Disease stage IV -Patient appears to have a baseline creatinine of around 1.6- 2, who presents with a creatinine of 2.26 and BUN 37. -Repeat BMP showed BUN/Cr went from 38/2.25 -> 32/2.00 -Avoid Nephrotoxic Medications if possible -Repeat CMP in AM   Leukocytosis, improved -WBC 10.9 on admission. Repeat  This AM was 9.4 -Suspect that this could be reactive to the acute fracture. -Patient is Afebrile -Check Urinalysis (Not done yet)  Diabetes Mellitus Type 2  -Patient presents with initial blood glucose of 251 on admission.   -Last hemoglobin A1c noted to be 7.6 on 11/08/2017. -C/w Hypoglycemic protocols -Decreased glargine insulin from 50 units to 25 units prior to surgery -C/w Resistant Novolog SSI AC/HS -CBG's ranging from 854-627  Chronic Diastolic CHF  -Patient does not appear to be grossly fluid overloaded at this time.  Last EF 60-65% with grade 1 diastolic dysfunction in 03/50/09. -Strict I's/Os, Daily Weights -Patient is -970 mL  however not accurate because condom catheter came off and patient urinated in bed.  -Weight is Up 1 lb.  -Continue Isosorbide Mononitrate 30 mg po Daily -Checked BNP and was 557.7 -Continue to Monitor Volume Status   Essential Hypertension -Continue Amlodipine 5 mg po  Daily  CAD s/p CABG and DES - Continue ASA 81 mg po Daily, Rosuvastatin 20 mg po Daily, and Isosorbide Mononitrate 30 mg po Daily  Complete heart block s/p PPM -C/w Telemetry -Pacemaker last interrogated on 10/16/2017.   -Followed by Dr. Angelena Form. -Cardiology following this Admission and will see patient post-operatively  Hyperlipidemia -Check Lipid Panel in AM -Continue Rosuvastatin 20 mg po qHS  Hx of Subdural Hemorrhage -Head and Cervical CT showed no evidence for acute intracranial abnormality, atrophy and small vessel disease, opacification of the left maxillary sinus without imaged fx, and cervical spine degenerative changes without acute fracture.  -Appears Stable; Continue to Monitor  DVT prophylaxis: Enoxaparin 40 mg sq q24h Code Status: FULL CODE Family Communication: Discussed with wife at bedside Disposition Plan: Remain Inpatient for Hip Surgery tomorrow  Consultants:   Cardiology Dr. Cloyde Reams  Orthopedic Surgery Dr. Sharol Given   Procedures:  Plan for intramedullary fixation for right intertrochanteric hip fracture today   Antimicrobials: Anti-infectives (From admission, onward)   Start     Dose/Rate Route Frequency Ordered Stop   12/04/17 1500  clindamycin (CLEOCIN) IVPB 900 mg     900 mg 100 mL/hr over 30 Minutes Intravenous To ShortStay Surgical 12/04/17 1022 12/05/17 1500     Subjective: Seen and examined and was doing ok. Had no complaints. Wife wanted to see Dr. Sharol Given. No CP or SOB. Patient states he no pain but then complained about some leg pain.   Objective: Vitals:   12/03/17 0522 12/03/17 1555 12/03/17 2139 12/04/17 0454  BP: (!) 120/53 (!) 157/74 (!) 153/80 (!) 142/81  Pulse: 81 86 (!) 106 83  Resp: _0 Temp: (!) 97.5 F (36.4 C) 98.2 F (36.8 C) 97.6 F (36.4 C) 97.7 F (36.5 C)  TempSrc: Oral Oral Oral Oral  SpO2: 97% 98% 95% 92%  Weight:   106.6 kg (235 lb 0.2 oz) 107 kg (235 lb 14.3 oz)  Height:         Intake/Output Summary (Last 24 hours) at 12/04/2017 1336 Last data filed at 12/04/2017 1100 Gross per 24 hour  Intake 480 ml  Output 1200 ml  Net -720 ml   Filed Weights   12/02/17 1821 12/03/17 2139 12/04/17 0454  Weight: 106.1 kg (234 lb) 106.6 kg (235 lb 0.2 oz) 107 kg (235 lb 14.3 oz)   Examination: Physical Exam:  Constitutional: WN/WD pleasantly demented Caucasian male in NAD appears calm in NAD Eyes: Sclerae anicteric. Lids appear normal ENMT: External ears and nose appear normal. Grossly normal hearing. MMM; Has a bandage on face.  Respiratory: Slightly diminished to auscultation but no appreciable wheezing/rales/rhonchi. Normal respiratory effort Cardiovascular: RRR; No appreciable m/r/g. 1+ LE edema Abdomen: Soft, NT, ND. Bowel sounds present GU: Deferred Musculoskeletal: Right leg was shorter and externally rotated. No clubbing or cyanosis Skin: Warm and dry. Has multiple skin lesions on body Neurologic: CN 2-12 grossly intact. No appreciable focal deficits Psychiatric: Impaired judgement and insight. Awake and alert. Normal mood and affect  Data Reviewed: I have personally reviewed following labs and imaging studies  CBC: Recent Labs  Lab 12/02/17 1823 12/03/17 0339 12/04/17 0903  WBC 10.9* 10.6* 9.4  NEUTROABS 7.7  --  6.0  HGB 15.3 13.7 12.4*  HCT 46.5 41.9 38.1*  MCV 91.4 91.9 92.0  PLT 171 157 622*   Basic Metabolic Panel: Recent Labs  Lab 12/02/17 1823 12/03/17 0339 12/04/17 0903  NA 134* 135 137  K 4.4 5.0 4.0  CL 102 105 108  CO2 20* 22 23  GLUCOSE 251* 300* 101*  BUN 37* 38* 32*  CREATININE 2.26* 2.25* 2.00*  CALCIUM 8.7* 8.6* 8.5*  MG  --   --  2.2  PHOS  --   --  2.8   GFR: Estimated Creatinine Clearance: 34.2 mL/min (A) (by C-G formula based on SCr of 2 mg/dL (H)). Liver Function Tests: Recent Labs  Lab 12/04/17 0903  AST 22  ALT 12*  ALKPHOS 29*  BILITOT 0.8  PROT 6.3*  ALBUMIN 2.9*   No results for input(s): LIPASE,  AMYLASE in the last 168 hours. No results for input(s): AMMONIA in the last 168 hours. Coagulation Profile: Recent Labs  Lab 12/02/17 1823  INR 1.02   Cardiac Enzymes: No results for input(s): CKTOTAL, CKMB, CKMBINDEX, TROPONINI in the last 168 hours. BNP (last 3 results) Recent Labs    03/07/17 1255  PROBNP 379.0*   HbA1C: No results for input(s): HGBA1C in the last 72 hours. CBG: Recent Labs  Lab 12/03/17 0653 12/03/17 1217 12/03/17 1605 12/03/17 2144 12/04/17 0638  GLUCAP 283* 226* 389* 187* 128*   Lipid Profile: No results for input(s): CHOL, HDL, LDLCALC, TRIG, CHOLHDL, LDLDIRECT in the last 72 hours. Thyroid Function Tests: No results for input(s): TSH, T4TOTAL, FREET4, T3FREE, THYROIDAB in the last 72 hours. Anemia Panel: No results for input(s): VITAMINB12, FOLATE, FERRITIN, TIBC, IRON, RETICCTPCT in the last 72 hours. Sepsis Labs: No results for input(s): PROCALCITON, LATICACIDVEN in the last 168 hours.  Recent Results (from the past 240 hour(s))  Surgical PCR screen     Status: Abnormal   Collection Time: 12/03/17  5:25 PM  Result Value Ref Range Status   MRSA, PCR POSITIVE (A) NEGATIVE Final   Staphylococcus aureus POSITIVE (A) NEGATIVE Final    Comment: RESULT CALLED TO, READ BACK BY AND VERIFIED WITH: A STEVENS,RN AT 1938 12/03/17 BY L BENFIELD (NOTE) The Xpert SA Assay (FDA approved for NASAL specimens in patients 55 years of age and older), is one component of a comprehensive surveillance program. It is not intended to diagnose infection nor to guide or monitor treatment.     Radiology Studies: Dg Chest 1 View  Result Date: 12/02/2017 CLINICAL DATA:  Patient status post fall. EXAM: CHEST 1 VIEW COMPARISON:  Chest radiograph 01/13/2017. FINDINGS: Multi lead pacer apparatus overlies the left hemithorax. Interval enlargement cardiac and mediastinal contours. Tortuosity of the thoracic aorta. Low lung volumes. Bibasilar atelectasis. No pleural effusion  or pneumothorax. IMPRESSION: The cardiac and mediastinal contours appear enlarged when compared to recent prior exam which may be secondary to portable low volume technique. Pericardial effusion or other mediastinal process is not excluded. Recommend further evaluation with PA and lateral chest radiograph. Low lung volumes with basilar atelectasis. Electronically Signed   By: Lovey Newcomer M.D.   On: 12/02/2017 19:10   Dg Chest 2 View  Result Date: 12/02/2017 CLINICAL DATA:  Fall. Previous portable study recommended repeat upright and lateral views for better evaluation of the patient. EXAM: CHEST  2 VIEW COMPARISON:  Portable chest 12/02/2017 FINDINGS: Cardiac pacemaker. Shallow inspiration. Cardiac enlargement. No pulmonary vascular congestion or edema. No focal consolidation. Calcified and tortuous aorta. Ribs mediastinal prominence seen previously is  improved. This likely represented positional changes. No pneumothorax. No blunting of costophrenic angles. Calcification of the aorta. Degenerative changes in the spine. IMPRESSION: Shallow inspiration. Cardiac enlargement. No edema or consolidation. Aortic atherosclerosis. Electronically Signed   By: Lucienne Capers M.D.   On: 12/02/2017 22:29   Ct Head Wo Contrast  Result Date: 12/02/2017 CLINICAL DATA:  Per ed notes: Pt brought here by wife from home after tripping in yard while using walker EXAM: CT HEAD WITHOUT CONTRAST CT CERVICAL SPINE WITHOUT CONTRAST TECHNIQUE: Multidetector CT imaging of the head and cervical spine was performed following the standard protocol without intravenous contrast. Multiplanar CT image reconstructions of the cervical spine were also generated. COMPARISON:  11/22/2017 FINDINGS: CT HEAD FINDINGS Brain: There is mild central and cortical atrophy. Periventricular white matter changes are consistent with small vessel disease. There is no intra or extra-axial fluid collection or mass lesion. The basilar cisterns and ventricles have  a normal appearance. There is no CT evidence for acute infarction or hemorrhage. Vascular: There is atherosclerotic calcification of the carotid siphons. Skull: Normal. Negative for fracture or focal lesion. Sinuses/Orbits: There is opacification of the left maxillary sinus. No sinus wall fracture imaged. Other: None CT CERVICAL SPINE FINDINGS Alignment: There is reversal of the normal cervical lordosis which may be secondary to splinting, positioning, or soft tissue injury. Skull base and vertebrae: No acute fracture. No primary bone lesion or focal pathologic process. Soft tissues and spinal canal: No prevertebral fluid or swelling. No visible canal hematoma. Disc levels:  Mild disc height loss primarily at C5-6 the C6-7. Upper chest: Remote left clavicle fracture. There is atherosclerotic calcification of the thoracic aorta. Left-sided pacemaker. Other: None IMPRESSION: 1.  No evidence for acute intracranial abnormality. 2. Atrophy and small vessel disease. 3. Opacification of the left maxillary sinus without imaged fracture. 4. Cervical spine degenerative changes without acute fracture. Electronically Signed   By: Nolon Nations M.D.   On: 12/02/2017 20:11   Ct Cervical Spine Wo Contrast  Result Date: 12/02/2017 CLINICAL DATA:  Per ed notes: Pt brought here by wife from home after tripping in yard while using walker EXAM: CT HEAD WITHOUT CONTRAST CT CERVICAL SPINE WITHOUT CONTRAST TECHNIQUE: Multidetector CT imaging of the head and cervical spine was performed following the standard protocol without intravenous contrast. Multiplanar CT image reconstructions of the cervical spine were also generated. COMPARISON:  11/22/2017 FINDINGS: CT HEAD FINDINGS Brain: There is mild central and cortical atrophy. Periventricular white matter changes are consistent with small vessel disease. There is no intra or extra-axial fluid collection or mass lesion. The basilar cisterns and ventricles have a normal appearance.  There is no CT evidence for acute infarction or hemorrhage. Vascular: There is atherosclerotic calcification of the carotid siphons. Skull: Normal. Negative for fracture or focal lesion. Sinuses/Orbits: There is opacification of the left maxillary sinus. No sinus wall fracture imaged. Other: None CT CERVICAL SPINE FINDINGS Alignment: There is reversal of the normal cervical lordosis which may be secondary to splinting, positioning, or soft tissue injury. Skull base and vertebrae: No acute fracture. No primary bone lesion or focal pathologic process. Soft tissues and spinal canal: No prevertebral fluid or swelling. No visible canal hematoma. Disc levels:  Mild disc height loss primarily at C5-6 the C6-7. Upper chest: Remote left clavicle fracture. There is atherosclerotic calcification of the thoracic aorta. Left-sided pacemaker. Other: None IMPRESSION: 1.  No evidence for acute intracranial abnormality. 2. Atrophy and small vessel disease. 3. Opacification of the left maxillary  sinus without imaged fracture. 4. Cervical spine degenerative changes without acute fracture. Electronically Signed   By: Nolon Nations M.D.   On: 12/02/2017 20:11   Dg Hip Unilat  With Pelvis 2-3 Views Right  Result Date: 12/02/2017 CLINICAL DATA:  Patient reports a fall today.  Pain. EXAM: DG HIP (WITH OR WITHOUT PELVIS) 2-3V RIGHT COMPARISON:  01/24/2016 FINDINGS: Bones are diffusely demineralized. Comminuted intertrochanteric right femoral neck fracture with varus angulation evident. Lesser trochanter exists as a free fragment. Cross-table lateral films are nondiagnostic. SI joints and symphysis pubis unremarkable. Penile prosthetic device evident. IMPRESSION: Comminuted intertrochanteric right femoral neck fracture with varus angulation. Electronically Signed   By: Misty Stanley M.D.   On: 12/02/2017 19:09   Scheduled Meds: . amLODipine  5 mg Oral Daily  . aspirin EC  81 mg Oral QHS  . chlorhexidine  60 mL Topical Once  .  enoxaparin (LOVENOX) injection  40 mg Subcutaneous Q24H  . insulin aspart  0-20 Units Subcutaneous TID WC  . insulin aspart  0-5 Units Subcutaneous QHS  . insulin glargine  25 Units Subcutaneous Daily  . isosorbide mononitrate  30 mg Oral Daily  . povidone-iodine  2 application Topical Once  . rosuvastatin  20 mg Oral QHS  . senna  1 tablet Oral BID  . sertraline  25 mg Oral Daily  . tamsulosin  0.4 mg Oral Daily   Continuous Infusions: . clindamycin (CLEOCIN) IV      LOS: 2 days   Kerney Elbe, DO [facility] Pager [phone]  If 7PM-7AM, please contact night-coverage [email or web address] Password [email or web address] 12/04/2017, 1:36 PM

## 2017-12-04 NOTE — Transfer of Care (Signed)
Immediate Anesthesia Transfer of Care Note  Patient: Frank Simpson  Procedure(s) Performed: INTRAMEDULLARY (IM) NAIL INTERTROCHANTRIC (Right )  Patient Location: PACU  Anesthesia Type:General  Level of Consciousness: sedated and patient cooperative  Airway & Oxygen Therapy: Patient Spontanous Breathing and Patient connected to nasal cannula oxygen  Post-op Assessment: Report given to RN and Post -op Vital signs reviewed and stable  Post vital signs: Reviewed and stable  Last Vitals:  Vitals:   12/04/17 1300 12/04/17 1745  BP: 137/60 (!) 106/51  Pulse: 69 80  Resp: 16 (!) 25  Temp: 36.7 C (!) 36.4 C  SpO2: 100% 90%    Last Pain:  Vitals:   12/04/17 1300  TempSrc: Oral  PainSc:       Patients Stated Pain Goal: 3 (41/28/78 6767)  Complications: No apparent anesthesia complications

## 2017-12-04 NOTE — Progress Notes (Signed)
Orthopedic Tech Progress Note Patient Details:  Frank Simpson 1930/12/03 [id number]  Patient ID: [name], male   DOB: [birth year], 84 y.o.   MRN: [id number] Pt cant have ohf due to age restrictions  Karolee Stamps 12/04/2017, 7:31 PM

## 2017-12-04 NOTE — Anesthesia Procedure Notes (Signed)
Procedure Name: Intubation Date/Time: 12/04/2017 4:39 PM Performed by: Julieta Bellini, CRNA Pre-anesthesia Checklist: Patient identified, Emergency Drugs available, Suction available and Patient being monitored Patient Re-evaluated:Patient Re-evaluated prior to induction Oxygen Delivery Method: Circle system utilized Preoxygenation: Pre-oxygenation with 100% oxygen Induction Type: IV induction Ventilation: Mask ventilation without difficulty Laryngoscope Size: Mac and 4 Grade View: Grade II Tube type: Oral Tube size: 7.5 mm Number of attempts: 1 Airway Equipment and Method: Stylet Placement Confirmation: ETT inserted through vocal cords under direct vision,  positive ETCO2 and breath sounds checked- equal and bilateral Secured at: 23 cm Tube secured with: Tape Dental Injury: Teeth and Oropharynx as per pre-operative assessment

## 2017-12-05 ENCOUNTER — Inpatient Hospital Stay (HOSPITAL_COMMUNITY): Payer: Medicare Other

## 2017-12-05 ENCOUNTER — Encounter (HOSPITAL_COMMUNITY): Payer: Self-pay | Admitting: Orthopedic Surgery

## 2017-12-05 LAB — COMPREHENSIVE METABOLIC PANEL
ALK PHOS: 29 U/L — AB (ref 38–126)
ALT: 14 U/L — ABNORMAL LOW (ref 17–63)
ANION GAP: 9 (ref 5–15)
AST: 22 U/L (ref 15–41)
Albumin: 2.9 g/dL — ABNORMAL LOW (ref 3.5–5.0)
BILIRUBIN TOTAL: 1.3 mg/dL — AB (ref 0.3–1.2)
BUN: 40 mg/dL — AB (ref 6–20)
CALCIUM: 8.5 mg/dL — AB (ref 8.9–10.3)
CO2: 23 mmol/L (ref 22–32)
Chloride: 106 mmol/L (ref 101–111)
Creatinine, Ser: 2.23 mg/dL — ABNORMAL HIGH (ref 0.61–1.24)
GFR calc Af Amer: 29 mL/min — ABNORMAL LOW (ref >60)
GFR, EST NON AFRICAN AMERICAN: 25 mL/min — AB (ref >60)
GLUCOSE: 272 mg/dL — AB (ref 65–99)
POTASSIUM: 5 mmol/L (ref 3.5–5.1)
Sodium: 138 mmol/L (ref 135–145)
Total Protein: 6.5 g/dL (ref 6.5–8.1)

## 2017-12-05 LAB — CBC WITH DIFFERENTIAL/PLATELET
BASOS ABS: 0 10*3/uL (ref 0.0–0.1)
Basophils Relative: 0 %
EOS ABS: 0 10*3/uL (ref 0.0–0.7)
Eosinophils Relative: 0 %
HEMATOCRIT: 37.1 % — AB (ref 39.0–52.0)
HEMOGLOBIN: 12.2 g/dL — AB (ref 13.0–17.0)
LYMPHS ABS: 1.5 10*3/uL (ref 0.7–4.0)
LYMPHS PCT: 15 %
MCH: 30.4 pg (ref 26.0–34.0)
MCHC: 32.9 g/dL (ref 30.0–36.0)
MCV: 92.5 fL (ref 78.0–100.0)
MONO ABS: 1 10*3/uL (ref 0.1–1.0)
Monocytes Relative: 9 %
NEUTROS PCT: 76 %
Neutro Abs: 7.7 10*3/uL (ref 1.7–7.7)
Platelets: 129 10*3/uL — ABNORMAL LOW (ref 150–400)
RBC: 4.01 MIL/uL — ABNORMAL LOW (ref 4.22–5.81)
RDW: 13.3 % (ref 11.5–15.5)
WBC: 10.2 10*3/uL (ref 4.0–10.5)

## 2017-12-05 LAB — LIPID PANEL
Cholesterol: 104 mg/dL (ref 0–200)
HDL: 34 mg/dL — ABNORMAL LOW (ref >40)
LDL CALC: 47 mg/dL (ref 0–99)
TRIGLYCERIDES: 116 mg/dL (ref <150)
Total CHOL/HDL Ratio: 3.1 RATIO
VLDL: 23 mg/dL (ref 0–40)

## 2017-12-05 LAB — GLUCOSE, CAPILLARY
GLUCOSE-CAPILLARY: 230 mg/dL — AB (ref 65–99)
GLUCOSE-CAPILLARY: 338 mg/dL — AB (ref 65–99)
GLUCOSE-CAPILLARY: 310 mg/dL — AB (ref 65–99)
Glucose-Capillary: 272 mg/dL — ABNORMAL HIGH (ref 65–99)

## 2017-12-05 LAB — MAGNESIUM: MAGNESIUM: 2.2 mg/dL (ref 1.7–2.4)

## 2017-12-05 LAB — PHOSPHORUS: Phosphorus: 4.6 mg/dL (ref 2.5–4.6)

## 2017-12-05 MED ORDER — IPRATROPIUM-ALBUTEROL 0.5-2.5 (3) MG/3ML IN SOLN
3.0000 mL | RESPIRATORY_TRACT | Status: AC
  Administered 2017-12-05: 3 mL via RESPIRATORY_TRACT
  Filled 2017-12-05: qty 3

## 2017-12-05 MED ORDER — DEXTROSE 5 % IV SOLN
2.0000 g | INTRAVENOUS | Status: DC
  Administered 2017-12-05 – 2017-12-07 (×3): 2 g via INTRAVENOUS
  Filled 2017-12-05 (×4): qty 2

## 2017-12-05 MED ORDER — METRONIDAZOLE IN NACL 5-0.79 MG/ML-% IV SOLN
500.0000 mg | Freq: Three times a day (TID) | INTRAVENOUS | Status: DC
  Administered 2017-12-05 – 2017-12-07 (×5): 500 mg via INTRAVENOUS
  Filled 2017-12-05 (×6): qty 100

## 2017-12-05 NOTE — Anesthesia Postprocedure Evaluation (Signed)
Anesthesia Post Note  Patient: Frank Simpson  Procedure(s) Performed: INTRAMEDULLARY (IM) NAIL INTERTROCHANTRIC (Right )     Patient location during evaluation: PACU Anesthesia Type: General Level of consciousness: sedated and patient cooperative Pain management: pain level controlled Vital Signs Assessment: post-procedure vital signs reviewed and stable Respiratory status: spontaneous breathing Cardiovascular status: stable Anesthetic complications: no    Last Vitals:  Vitals:   12/05/17 1300 12/05/17 1725  BP: (!) 122/57   Pulse: 79   Resp: 17   Temp: 36.9 C   SpO2: 96% (!) 84%    Last Pain:  Vitals:   12/05/17 1300  TempSrc: Oral  PainSc:    Pain Goal: Patients Stated Pain Goal: 2 (12/05/17 1040)               Nolon Nations

## 2017-12-05 NOTE — Progress Notes (Signed)
Bandages in place on left shoulder and right cheek during skin assessment. Patient's wife changes them out daily due to skin cancer biopsy done prior to admission.

## 2017-12-05 NOTE — Significant Event (Addendum)
Rapid Response Event Note  Overview: Called by PP to triage patient for SDU.    Initial Focused Assessment: Upon arrival, patient was resting chair, drowsy but quickly arousable, flat affect, once alert was able to follow commands.  Lung sounds clear in the upper/mildly diminished in the lowers but overall good air movement bilaterally, no SOB, no WOB, no wheezing.  RR even and ranging 16-18, equal chest rise,sats 99% on NRB 10L, not in any acute respiratory distress, per patient he stated his breathing was better and he was not in any pain, discomfort, or distress. Earlier today patient was eating and started to choke and oxygen saturations had dropped.   Interventions: -- Transitioned to VM 40% 8L, sats maintaining greater than 97%.  Waited about 20 minutes, transitioned down to VM 35% 6L, sats maintaining. At 2010 transitioned to 4L [place], sats still maintaining greater than 96%.   Plan of Care (if not transferred): -- Keep NPO -- Wean oxygen as tolerated by patient, per RN, last night was was on 3L [place]. -- Should SDU be available patient can be transferred, currently is stable and tolerating the weaning of oxygen.  Should patient continue tolerating weaning of well, will reach out to primary team to discuss level of care. -- I informed RT as well.  -- Patient to remain on continuous telemetry and pulse oximetry as well.  Event Summary:  End Time 2015  Yessica Putnam R

## 2017-12-05 NOTE — Progress Notes (Signed)
Patient ID: Frank Simpson, male   DOB: 11-28-1931, 84 y.o.   MRN: [id number] Postoperative day 1 right intramedullary nail fixation for intertrochanteric fracture.  Patient is resting comfortably this morning O2 sats 96%.  Plan for discharge to skilled nursing.  Touchdown weightbearing on the right.

## 2017-12-05 NOTE — Progress Notes (Signed)
Patient used flutter valve with good effort. Breath sounds are clear/diminished bilaterally at this time.  Patient states he does not wear his CPAP at home and does not wish to wear one tonight. RT informed patient to have RT called if he changes his mind. RT will monitor as needed.

## 2017-12-05 NOTE — Progress Notes (Signed)
Progress Note  Patient Name: Frank Simpson Date of Encounter: 12/05/2017  Primary Cardiologist: Lauree Chandler, MD   Subjective   No chest pain or dyspnea  Inpatient Medications    Scheduled Meds: . amLODipine  5 mg Oral Daily  . aspirin EC  325 mg Oral Q breakfast  . aspirin EC  81 mg Oral QHS  . enoxaparin (LOVENOX) injection  40 mg Subcutaneous Q24H  . insulin aspart  0-20 Units Subcutaneous TID WC  . insulin aspart  0-5 Units Subcutaneous QHS  . insulin glargine  25 Units Subcutaneous Daily  . isosorbide mononitrate  30 mg Oral Daily  . rosuvastatin  20 mg Oral QHS  . senna  1 tablet Oral BID  . sertraline  25 mg Oral Daily  . tamsulosin  0.4 mg Oral Daily   Continuous Infusions: . sodium chloride 50 mL/hr at 12/04/17 2255   PRN Meds: acetaminophen **OR** acetaminophen, HYDROcodone-acetaminophen, menthol-cetylpyridinium **OR** phenol, metoCLOPramide **OR** metoCLOPramide (REGLAN) injection, ondansetron **OR** ondansetron (ZOFRAN) IV   Vital Signs    Vitals:   12/04/17 1815 12/04/17 1826 12/04/17 1851 12/04/17 2212  BP: 133/75 (!) 154/77 (!) 160/78 138/64  Pulse: 81 87 77 92  Resp: (!) 24 (!) _0 Temp:  97.6 F (36.4 C) 97.6 F (36.4 C) 98.9 F (37.2 C)  TempSrc:    Axillary  SpO2: 94% 93% 93% 94%  Weight:      Height:        Intake/Output Summary (Last 24 hours) at 12/05/2017 1127 Last data filed at 12/05/2017 0700 Gross per 24 hour  Intake 1400 ml  Output 300 ml  Net 1100 ml   Filed Weights   12/02/17 1821 12/03/17 2139 12/04/17 0454  Weight: 234 lb (106.1 kg) 235 lb 0.2 oz (106.6 kg) 235 lb 14.3 oz (107 kg)    Telemetry   No tele  ECG   No am ekg  Physical Exam   GEN: No acute distress.   Neck: No JVD Cardiac: RRR, no murmurs, rubs, or gallops.  Respiratory: Clear to auscultation bilaterally. GI: Soft, nontender, non-distended  MS: No edema; No deformity. Neuro:  Nonfocal  Psych: Normal affect   Labs     Chemistry Recent Labs  Lab 12/03/17 0339 12/04/17 0903 12/05/17 0737  NA 135 137 138  K 5.0 4.0 5.0  CL 105 108 106  CO2 _1 GLUCOSE 300* 101* 272*  BUN 38* 32* 40*  CREATININE 2.25* 2.00* 2.23*  CALCIUM 8.6* 8.5* 8.5*  PROT  --  6.3* 6.5  ALBUMIN  --  2.9* 2.9*  AST  --  22 22  ALT  --  12* 14*  ALKPHOS  --  29* 29*  BILITOT  --  0.8 1.3*  GFRNONAA 25* 29* 25*  GFRAA 29* 34* 29*  ANIONGAP _2 Hematology Recent Labs  Lab 12/03/17 0339 12/04/17 0903 12/05/17 0737  WBC 10.6* 9.4 10.2  RBC 4.56 4.14* 4.01*  HGB 13.7 12.4* 12.2*  HCT 41.9 38.1* 37.1*  MCV 91.9 92.0 92.5  MCH 30.0 30.0 30.4  MCHC 32.7 32.5 32.9  RDW 13.7 13.5 13.3  PLT 157 122* 129*    Cardiac EnzymesNo results for input(s): TROPONINI in the last 168 hours. No results for input(s): TROPIPOC in the last 168 hours.   BNP Recent Labs  Lab 12/02/17 1823  BNP 557.7*     DDimer No results for input(s): DDIMER in the last  168 hours.   Radiology    Dg C-arm 1-60 Min  Result Date: 12/04/2017 CLINICAL DATA:  Internal fixation of a intertrochanteric fracture. EXAM: OPERATIVE right HIP (WITH PELVIS IF PERFORMED) 2 VIEWS TECHNIQUE: Fluoroscopic spot image(s) were submitted for interpretation post-operatively. COMPARISON:  Radiographs 12/02/2017 FINDINGS: There is an intramedullary gamma nail with a proximal dynamic hip screw and a distal interlocking screw transfixing the intertrochanteric fracture. Anatomic reduction without complicating features. IMPRESSION: Close reduction and internal fixation of a right hip intertrochanteric fracture with anatomic reduction. Electronically Signed   By: Marijo Sanes M.D.   On: 12/04/2017 19:28   Dg Hip Operative Unilat W Or W/o Pelvis Right  Result Date: 12/04/2017 CLINICAL DATA:  Internal fixation of a intertrochanteric fracture. EXAM: OPERATIVE right HIP (WITH PELVIS IF PERFORMED) 2 VIEWS TECHNIQUE: Fluoroscopic spot image(s) were submitted for  interpretation post-operatively. COMPARISON:  Radiographs 12/02/2017 FINDINGS: There is an intramedullary gamma nail with a proximal dynamic hip screw and a distal interlocking screw transfixing the intertrochanteric fracture. Anatomic reduction without complicating features. IMPRESSION: Close reduction and internal fixation of a right hip intertrochanteric fracture with anatomic reduction. Electronically Signed   By: Marijo Sanes M.D.   On: 12/04/2017 19:28    Cardiac Studies    Patient Profile     84 y.o. male with history of CAD, HTN, HLD, DM, CKD admitted with hip fracture. He underwent uncomplicated repair of his hip fracture 12/04/16.   Assessment & Plan    1. CAD: He is stable this am. No chest pain or dyspnea. Continue ASA, Imdur and Norvasc.  No active cardiac issues.   For questions or updates, please contact [place] Please consult [email or web address] for contact info under Cardiology/STEMI.      Signed, Lauree Chandler, MD  12/05/2017, 11:27 AM

## 2017-12-05 NOTE — Progress Notes (Signed)
Pharmacy Antibiotic Note  Frank Simpson is a 84 y.o. male with concern of aspiration PNA.  Pharmacy has been consulted for zosyn dosing.  -WBC= 10/2, afebrile, SCr= 2.23, CrCl ~ 30   Plan: -Zosyn 3.375gm IV q8h -Will follow renal function, cultures and clinical progress    Height: 5' 11" (180.3 cm) Weight: 235 lb 14.3 oz (107 kg) IBW/kg (Calculated) : 75.3  Temp (24hrs), Avg:98 F (36.7 C), Min:97.5 F (36.4 C), Max:98.9 F (37.2 C)  Recent Labs  Lab 12/02/17 1823 12/03/17 0339 12/04/17 0903 12/05/17 0737  WBC 10.9* 10.6* 9.4 10.2  CREATININE 2.26* 2.25* 2.00* 2.23*    Estimated Creatinine Clearance: 30.7 mL/min (A) (by C-G formula based on SCr of 2.23 mg/dL (H)).    Allergies  Allergen Reactions  . Metformin And Related Other (See Comments)    Affected kidneys  . Oxycodone Other (See Comments)    Severe confusion  . Januvia [Sitagliptin Phosphate] Other (See Comments)    Possibly affected kidneys?  . Codeine   . Penicillins Other (See Comments)    Reaction long time ago - doesn't remember     Thank you for allowing pharmacy to be a part of this patient's care.  Hildred Laser, Pharm D 12/05/2017 5:32 PM   e

## 2017-12-05 NOTE — Progress Notes (Signed)
Nursing notified me that patient had choked on his food and dropped his O2 Saturations. I advised nursing to turn up the O2, get a Stat Portable, make the pateint NPO, obtain a breathing treatment and start Abx. I went to assess the patient at bedside and he was placed on a NRB and saturating 15%. He had some labored breathing and had diminished breath sounds with expiratory wheezing on Right. Advised nurse that I was going to transfer the patient down to Step Down for further evaluation and monitoring. Patient made Strict NPO and SLP was consulted. I called to update the wife and she tells me he has choking episodes similar to this kind because he pockets food and does not swallow when eating.

## 2017-12-05 NOTE — Progress Notes (Signed)
Patient has suction available in room due to aspiration   *Aspiration Precautions* *NPO* Swallow evaluation ordered per MD*

## 2017-12-05 NOTE — Evaluation (Signed)
Occupational Therapy Evaluation Patient Details Name: Frank Simpson MRN: [id number] DOB: 1931/04/11 Today's Date: 12/05/2017    History of Present Illness Frank Simpson is a 84 y.o. male with medical history significant of HTN, MI, CAD s/p DES to LAD, Diastolic dysfunction last EF 55-60% with grade 2 dysfunction, CHB s/p PPM, DM type II with peripheral neuropathy, Basal cell skin cancer s/p removal, syncope, and recurrent falls with previous SDH in 10/2016; who presents after having a mechanical fall at home reporting excruciating right leg pain. R femur fracture, now s/p R IM Nail, TWB RLE   Clinical Impression   This 84 y/o M presents with the above. Pt lives with spouse, at baseline was using RW for functional mobility and receiving assist from spouse for ADL completion. Pt presents with generalized weakness, decreased initiation/processing, and decreased functional performance. Pt had just recently gotten up to recliner with PT prior to start of session, requiring +3 assist to complete. Pt currently requires MinA for grooming ADLs with verbal cues for completion, MinA for UB ADLs, Max-total assist for LB ADLs. Pt will benefit from continued acute OT services and recommend SNF level services prior to return home to maximize Pt's safety and independence with ADLs and mobility.     Follow Up Recommendations  SNF;Supervision/Assistance - 24 hour    Equipment Recommendations  Other (comment)(defer to next venue )           Precautions / Restrictions Precautions Precautions: Fall Restrictions Weight Bearing Restrictions: Yes RLE Weight Bearing: Touchdown weight bearing      Mobility Bed Mobility Overal bed mobility: Needs Assistance Bed Mobility: Supine to Sit     Supine to sit: +2 for physical assistance;Max assist     General bed mobility comments: OOB upon arrival   Transfers Overall transfer level: Needs assistance Equipment used: 2 person hand held assist(and 3rd  person in the "wedge" for safety, support) Transfers: Squat Pivot Transfers     Squat pivot transfers: +2 physical assistance;Max assist(+3)     General transfer comment: Performed basic squat pivot transfer towards pt's L side with 3 person assist; L knee blocked for stability    Balance Overall balance assessment: Needs assistance Sitting-balance support: Bilateral upper extremity supported;Feet supported Sitting balance-Leahy Scale: Poor Sitting balance - Comments: Needs occasional external support for sitting balance; antalgic L lean Postural control: Left lateral lean   Standing balance-Leahy Scale: Zero                             ADL either performed or assessed with clinical judgement   ADL Overall ADL's : Needs assistance/impaired Eating/Feeding: Set up;Minimal assistance;Sitting Eating/Feeding Details (indicate cue type and reason): Pt requires assist to cut up food and open containers; Pt eating lunch end of session, required verbal cues for small bites and to swallow food completely prior to taking next bite as Pt tending to continue placing food in mouth without fully swallowing/chewing; Pt's spouse present to supervise upon exiting room  Grooming: Oral care;Minimal assistance;Sitting Grooming Details (indicate cue type and reason): Pt able to complete all parts of task though required verbal cues to open containers, assist to apply toothpaste, and verbal cues to brush different parts of mouth and initiate ending task  Upper Body Bathing: Minimal assistance;Sitting   Lower Body Bathing: Maximal assistance;+2 for physical assistance;+2 for safety/equipment;Bed level;Sitting/lateral leans   Upper Body Dressing : Minimal assistance;Sitting   Lower Body Dressing: Total assistance;+2  for physical assistance;+2 for safety/equipment;Bed level;Sitting/lateral leans       Toileting- Clothing Manipulation and Hygiene: Total assistance;Bed level         General  ADL Comments: Pt sitting up in recliner upon arrival having just finished working with PT, PT reporting Pt requires +3 assist for mobility; Pt completed seated grooming ADLs and self-feeding with MinA, requires increased time and repetition for all task completion      Vision Baseline Vision/History: Wears glasses Wears Glasses: At all times Vision Assessment?: Vision impaired- to be further tested in functional context Additional Comments: Pt with significant difficulty tracking, unsure whether due to visual or cognitive deficits and difficulty following directions; upon entering room Pt gaze preference to L, initially not acknowleding therapist within room, increased attention noted during session though with slower tracking and requires cues - to be further assessed                 Pertinent Vitals/Pain Pain Assessment: Faces Faces Pain Scale: Hurts even more Pain Location: R hip  Pain Descriptors / Indicators: Grimacing;Guarding Pain Intervention(s): Monitored during session;Premedicated before session     Hand Dominance Left   Extremity/Trunk Assessment Upper Extremity Assessment Upper Extremity Assessment: RUE deficits/detail;Generalized weakness RUE Deficits / Details: significant weakness noted in RUE (R>L), grossly 3-/5; decreased A/PROM, noted crepitus elbow joint during attempts at passive shoulder ROM; attempted to evaluate sensation however Pt dozing off with eyes closed so unsure as to accuracy of Pt report  RUE Coordination: decreased fine motor;decreased gross motor   Lower Extremity Assessment Lower Extremity Assessment: Defer to PT evaluation RLE Deficits / Details: Grossly decr ROM and muscle activation proximal RLE, limited by significant pain with any movement R hip RLE: Unable to fully assess due to pain       Communication Communication Communication: HOH   Cognition Arousal/Alertness: Awake/alert;Lethargic(dozing off end of session ) Behavior During  Therapy: Flat affect Overall Cognitive Status: Impaired/Different from baseline Area of Impairment: Attention;Following commands;Awareness;Problem solving                   Current Attention Level: Sustained   Following Commands: Follows one step commands with increased time   Awareness: Intellectual Problem Solving: Slow processing;Decreased initiation;Difficulty sequencing;Requires verbal cues;Requires tactile cues General Comments: Pt with decreased initiation when completing ADLs, requires one step commands and repetition to complete simple grooming tasks; Pt's wife reporting she suspects partly due to pain meds   General Comments  Session conducted on Room Air and O2 sats remained greater than or equal to 90%; Restarted supplemental O2 at 2 liters (down from 3 liters at beginning of session); RN notified               [place] expects to be discharged to:: Private residence Living Arrangements: Spouse/significant other Available Help at Discharge: Family;Available 24 hours/day Type of Home: House Home Access: Stairs to enter [employer] of Steps: 2 Entrance Stairs-Rails: Right;Left Home Layout: One level     Bathroom Shower/Tub: Occupational psychologist: Handicapped height     Home Equipment: Environmental consultant - 2 wheels;Grab bars - tub/shower;Toilet riser;Shower seat          Prior Functioning/Environment Level of Independence: Needs assistance  Gait / Transfers Assistance Needed: RW full time; Wife reports he often needs assistance/support when sitting unsupported ADL's / Homemaking Assistance Needed: Wife assist with all ADLs Communication / Swallowing Assistance Needed: Wife reports he has been drooling more over the past week  OT Treatment/Interventions: Self-care/ADL training;DME and/or AE instruction;Therapeutic activities;Balance training;Therapeutic exercise;Energy conservation;Patient/family  education    OT Goals(Current goals can be found in the care plan section) Acute Rehab OT Goals Patient Stated Goal: did not state OT Goal Formulation: With patient Time For Goal Achievement: 12/19/17 Potential to Achieve Goals: Fair  OT Frequency: Min 2X/week                             AM-PAC PT "6 Clicks" Daily Activity     Outcome Measure Help from another person eating meals?: A Little Help from another person taking care of personal grooming?: A Little Help from another person toileting, which includes using toliet, bedpan, or urinal?: Total Help from another person bathing (including washing, rinsing, drying)?: A Lot Help from another person to put on and taking off regular upper body clothing?: A Little Help from another person to put on and taking off regular lower body clothing?: Total 6 Click Score: 13   End of Session Equipment Utilized During Treatment: Oxygen Nurse Communication: Mobility status  Activity Tolerance: Patient tolerated treatment well Patient left: in chair;with call bell/phone within reach;with family/visitor present  OT Visit Diagnosis: Muscle weakness (generalized) (M62.81);History of falling (Z91.81);Unsteadiness on feet (R26.81)                Time: 7353-2992 OT Time Calculation (min): 39 min Charges:  OT General Charges $OT Visit: 1 Visit OT Evaluation $OT Eval Moderate Complexity: 1 Mod OT Treatments $Self Care/Home Management : 8-22 mins G-Codes:     Lou Cal, OT Pager [phone] 12/05/2017   Raymondo Band 12/05/2017, 2:49 PM

## 2017-12-05 NOTE — Progress Notes (Signed)
Patient has NRB mask on 15L, O2 96%, patient has coughing that is non-productive. Oral suction at bedside. RN spoke with wife, who notified RN of patient feeding self too fast, pocketing food, and chokes at home. Patient is alert and oriented.   MD notified and aware of situation, ordered antibiotics, chest x-ray, breathing treatment, and for patient to be transferred down to stepdown unit. Will continue to monitor patient, and keep family informed.

## 2017-12-05 NOTE — Evaluation (Signed)
Physical Therapy Evaluation Patient Details Name: Frank Simpson MRN: [id number] DOB: 1930-12-28 Today's Date: 12/05/2017   History of Present Illness  Frank Simpson is a 84 y.o. male with medical history significant of HTN, MI, CAD s/p DES to LAD, Diastolic dysfunction last EF 55-60% with grade 2 dysfunction, CHB s/p PPM, DM type II with peripheral neuropathy, Basal cell skin cancer s/p removal, syncope, and recurrent falls with previous SDH in 10/2016; who presents after having a mechanical fall at home reporting excruciating right leg pain. R femur fracture, now s/p R IM Nail, TWB RLE  Clinical Impression  Patient is s/p above surgery resulting in functional limitations due to the deficits listed below (see PT Problem List). Prior to admission, living with wife, using RW full-time; Presents with significantly decr functional mobility and activity tolerance, and requires max assist of 2-3 persons for safe transfers; Will need post-acute rehab to maximize independence and safety with mobility; Wife is asking about CIR, which is a possibility, though not sure that he will qualify this admission; will place CIR screen;  Patient will benefit from skilled PT to increase their independence and safety with mobility to allow discharge to the venue listed below.       Follow Up Recommendations Other (comment)(Post-acute Rehab)  Would pt qualify for [facility] through [facility] after post-acute rehab course?    Equipment Recommendations  Wheelchair (measurements PT);Wheelchair cushion (measurements PT)    Recommendations for Other Services       Precautions / Restrictions Precautions Precautions: Fall Restrictions RLE Weight Bearing: Touchdown weight bearing      Mobility  Bed Mobility Overal bed mobility: Needs Assistance Bed Mobility: Supine to Sit     Supine to sit: +2 for physical assistance;Max assist     General bed mobility comments: Instructional cues for techqniue, and pt  did reach out and pull on rail or handheld assist of therpist or nursing student when cued; Still, Max assist of at least 2 to ease LEs to floor and elevate trunk to sit  Transfers Overall transfer level: Needs assistance Equipment used: 2 person hand held assist(and 3rd person in the "wedge" for safety, support) Transfers: Squat Pivot Transfers     Squat pivot transfers: +2 physical assistance;Max assist(+3)     General transfer comment: Performed basic squat pivot transfer towards pt's L side with 3 person assist; L knee blocked for stability  Ambulation/Gait                Stairs            Wheelchair Mobility    Modified Rankin (Stroke Patients Only)       Balance Overall balance assessment: Needs assistance Sitting-balance support: Bilateral upper extremity supported;Feet supported Sitting balance-Leahy Scale: Poor Sitting balance - Comments: Needs occasional external support for sitting balance; antalgic L lean Postural control: Left lateral lean   Standing balance-Leahy Scale: Zero                               Pertinent Vitals/Pain Pain Assessment: Faces Faces Pain Scale: Hurts whole lot Pain Location: R hip with any movement Pain Descriptors / Indicators: Grimacing;Guarding Pain Intervention(s): Monitored during session;RN gave pain meds during session    Home Living Family/patient expects to be discharged to:: Private residence Living Arrangements: Spouse/significant other Available Help at Discharge: Family;Available 24 hours/day Type of Home: House Home Access: Stairs to enter Entrance Stairs-Rails: Psychiatric nurse  of Steps: 2 Home Layout: One level Home Equipment: [place] - 2 wheels;Other (comment)(other equipment TBD)      Prior Function Level of Independence: Needs assistance   Gait / Transfers Assistance Needed: RW full time; Wife reports he often needs assistance/support when sitting unsupported  ADL's  / Homemaking Assistance Needed: Wife assist with all ADLs        Hand Dominance   Dominant Hand: Right    Extremity/Trunk Assessment   Upper Extremity Assessment Upper Extremity Assessment: Defer to OT evaluation    Lower Extremity Assessment Lower Extremity Assessment: Generalized weakness;RLE deficits/detail RLE Deficits / Details: Grossly decr ROM and muscle activation proximal RLE, limited by significant pain with any movement R hip RLE: Unable to fully assess due to pain       Communication   Communication: HOH  Cognition Arousal/Alertness: Awake/alert Behavior During Therapy: WFL for tasks assessed/performed Overall Cognitive Status: History of cognitive impairments - at baseline                                 General Comments: Pt's wife did not report anything different than normal with pt's cognition      General Comments General comments (skin integrity, edema, etc.): Session conducted on Room Air and O2 sats remained greater than or equal to 90%; Restarted supplemental O2 at 2 liters (down from 3 liters at beginning of session); RN notified    Exercises     Assessment/Plan    PT Assessment Patient needs continued PT services  PT Problem List Decreased strength;Decreased range of motion;Decreased activity tolerance;Decreased balance;Decreased mobility;Decreased coordination;Decreased cognition;Decreased knowledge of use of DME;Decreased knowledge of precautions;Decreased safety awareness;Pain       PT Treatment Interventions DME instruction;Gait training;Stair training;Functional mobility training;Therapeutic activities;Therapeutic exercise;Patient/family education    PT Goals (Current goals can be found in the Care Plan section)  Acute Rehab PT Goals Patient Stated Goal: did not state PT Goal Formulation: With patient Time For Goal Achievement: 12/19/17 Potential to Achieve Goals: Fair    Frequency Min 3X/week   Barriers to discharge         Co-evaluation               AM-PAC PT "6 Clicks" Daily Activity  Outcome Measure Difficulty turning over in bed (including adjusting bedclothes, sheets and blankets)?: Unable Difficulty moving from lying on back to sitting on the side of the bed? : Unable Difficulty sitting down on and standing up from a chair with arms (e.g., wheelchair, bedside commode, etc,.)?: Unable Help needed moving to and from a bed to chair (including a wheelchair)?: Total Help needed walking in hospital room?: Total Help needed climbing 3-5 steps with a railing? : Total 6 Click Score: 6    End of Session Equipment Utilized During Treatment: Gait belt Activity Tolerance: Patient limited by pain Patient left: in chair;with call bell/phone within reach;with chair alarm set Nurse Communication: Mobility status;Other (comment);Weight bearing status(strategies for back to bed) PT Visit Diagnosis: Other abnormalities of gait and mobility (R26.89);Repeated falls (R29.6);Muscle weakness (generalized) (M62.81);History of falling (Z91.81);Pain Pain - Right/Left: Right Pain - part of body: Hip    Time: 1018-1100 PT Time Calculation (min) (ACUTE ONLY): 42 min   Charges:   PT Evaluation $PT Eval Moderate Complexity: 1 Mod PT Treatments $Therapeutic Activity: 23-37 mins   PT G Codes:        Roney Marion, [place] Pager  [place] Shaneya Taketa 12/05/2017, 1:48 PM

## 2017-12-05 NOTE — Progress Notes (Signed)
Patient is currently NPO

## 2017-12-05 NOTE — Progress Notes (Signed)
Patient choking on dinner, suctioned out mouth. Pocketed food. Oxygen saturation 84%. MD notified. Putting in orders for NPO, chest xray, and breathing treatment.

## 2017-12-05 NOTE — Progress Notes (Signed)
PROGRESS NOTE    Frank Simpson  QPY:[id number] DOB: [birth year] DOA: 12/02/2017 PCP: [name], MD  Brief Narrative: Frank Simpson is a 84 y.o. male with medical history significant of HTN, MI, CAD s/p DES to LAD, Diastolic dysfunction last EF 55-60% with grade 2 dysfunction, CHB s/p PPM, DM type II with peripheral neuropathy, Basal cell skin cancer s/p removal, syncope, and recurrent falls with previous SDH in 10/2016; who presents after having a mechanical fall at home reporting excruciating right leg pain.  Patient notes that he tripped over his walker which he normally uses at baseline to ambulate.  He has a long history of falls, and been noted to be progressively getting weaker.  He states that he did hit his head when he fell, but did not lose consciousness with this fall (notes loss of consciousness with previous falls). Denies having any  fever, chills, nausea, or vomiting symptoms.  Associated symptoms include complaints of headaches from sitting up and joint pains. CT scan of the brain and neck showed no acute intracranial or cervical abnormalities.  X-ray of the hip show a communicated intertrochanteric femoral neck fracture.  Patient was given 25 mcg of fentanyl initially for pain and subsequently 1 mg of Dilaudid.  Dr. Sharol Given of Orthopedics was consulted and plan on surgical repair later today 12/04/16. Cardiology was consulted for pre-operative clearance. Patient is POD 1 and doing well.  Assessment & Plan:   Principal Problem:   Fracture of femoral neck, right (HCC) Active Problems:   Hypercholesterolemia   CAD (coronary artery disease)   Diabetes mellitus type 2 with retinopathy (HCC)   Frequent falls   Chronic diastolic CHF (congestive heart failure) (HCC)   Leukocytosis   Displaced intertrochanteric fracture of right femur, initial encounter for closed fracture [facility])   Preoperative cardiovascular examination  Fall with Communicated intertrochanteric right femoral neck  fracture s/p Intramedullary Nail Fixation POD 1 -Acute.  Patient tripped over walker and presents with right leg pain.   -Found to have acute femoral neck fracture.  -Revised cardiac risk assessment score is 5( Cr>2, DM tx w/ insulin, H/O MI, CVA, SDH, and CHF) which would mean patient is greater than 11% risk for major cardiac event.  -Admitted to telemetry bed -Hip fracture order set was initiated -C/w Pain Control with Fentanyl 12.5-25 mcg every 3 hours prn  -Cardiology consulted for Pre-operative Risk Assessment and feel his at increased risk but not prohibitive for surgery and they do not recommend any additional cardiac testing prior to Surgery -Orthopedic Surgery Dr. Sharol Given evaluated and is planning for Surgery today, 12/04/17 around 4:00 pm  -Will defer VTE prophylaxis to Orthopedics -PT Consulted and recommending SNF vs. CIR; CIR to evaluate  -Per Ortho can have TDWB on Right   Chronic Kidney Disease stage IV -Patient appears to have a baseline creatinine of around 1.6- 2, who presents with a creatinine of 2.26 and BUN 37. -Repeat BMP showed BUN/Cr went from 38/2.25 -> 32/2.00 -> 40/2.23 -Avoid Nephrotoxic Medications if possible -Repeat CMP in AM   Leukocytosis, improved -WBC 10.9 on admission. Repeat this AM showed WBC went from 9.4 -> 10.2 -Patient is Afebrile -Checked Urinalysis and was unremarkable  Diabetes Mellitus Type 2  -Patient presents with initial blood glucose of 251 on admission.   -Last hemoglobin A1c noted to be 7.6 on 11/08/2017. -C/w Hypoglycemic protocols -Decreased glargine insulin from 50 units to 25 units prior to surgery; May need to increase dosing  -C/w Resistant Novolog SSI  AC/HS -CBG's ranging from 431-540  Chronic Diastolic CHF  -Patient does not appear to be grossly fluid overloaded at this time.  Last EF 60-65% with grade 1 diastolic dysfunction in 08/67/61. -Strict I's/Os, Daily Weights -Patient is +250 mL however not accurate because condom  catheter came off and patient urinated in bed.  -Weight is Up 1 lb.  -Continue Isosorbide Mononitrate 30 mg po Daily -Checked BNP and was 557.7 -Continue to Monitor Volume Status  -Cardiology following   Essential Hypertension -Continue Amlodipine 5 mg po Daily  CAD s/p CABG and DES - Continue ASA 81 mg po Daily, Rosuvastatin 20 mg po Daily, and Isosorbide Mononitrate 30 mg po Daily  Complete heart block s/p PPM -C/w Telemetry -Pacemaker last interrogated on 10/16/2017.   -Followed by Dr. Angelena Form; Dr. Angelena Form saw patient today -Cardiology following this Admission and will see patient post-operatively  Hyperlipidemia -Checked Lipid Panel and showed Cholesterol of 104, HDL of 34, LDL of 47, TG of 116, VLDL of 23 -Continue Rosuvastatin 20 mg po qHS  Hx of Subdural Hemorrhage -Head and Cervical CT showed no evidence for acute intracranial abnormality, atrophy and small vessel disease, opacification of the left maxillary sinus without imaged fx, and cervical spine degenerative changes without acute fracture.  -Appears Stable; Continue to Monitor  Hyperbilirubinemia -Mild. T Bili went from 0.8 -> 1.3 -Continue to Monitor and Repeat CMP in AM   Thrombocytopenia -Patient's Platelet Count went from 122 -> 129 -Repeat CBC in AM  Normocytic Anemia -Patient's Platelet Count went from 12.4/38.1 -> 12.2/37.1 -Continue to Monitor for S/Sx of Bleeding -Likely will have expected post-operative drop -Repeat CBC in AM  DVT prophylaxis: Enoxaparin 40 mg sq q24h Code Status: FULL CODE Family Communication: Discussed with wife at bedside Disposition Plan: Pending SNF vs. CIR  Consultants:   Cardiology Dr. Cloyde Reams  Orthopedic Surgery Dr. Sharol Given   Procedures:  Intramedullary fixation for right intertrochanteric hip fracture today   Antimicrobials: Anti-infectives (From admission, onward)   Start     Dose/Rate Route Frequency Ordered Stop   12/04/17 2200  clindamycin  (CLEOCIN) IVPB 600 mg     600 mg 100 mL/hr over 30 Minutes Intravenous Every 6 hours 12/04/17 1918 12/05/17 0635   12/04/17 1500  clindamycin (CLEOCIN) IVPB 900 mg     900 mg 100 mL/hr over 30 Minutes Intravenous To [facility] 12/04/17 1022 12/04/17 1710     Subjective: Seen and examined and was doing ok. No Hip Pain. No SOB or CP. No other concerns or complaints or concerns.   Objective: Vitals:   12/04/17 1826 12/04/17 1851 12/04/17 2212 12/05/17 1300  BP: (!) 154/77 (!) 160/78 138/64 (!) 122/57  Pulse: 87 77 92 79  Resp: (!) _0 Temp: 97.6 F (36.4 C) 97.6 F (36.4 C) 98.9 F (37.2 C) 98.4 F (36.9 C)  TempSrc:   Axillary Oral  SpO2: 93% 93% 94% 96%  Weight:      Height:        Intake/Output Summary (Last 24 hours) at 12/05/2017 1446 Last data filed at 12/05/2017 1300 Gross per 24 hour  Intake 1520 ml  Output 300 ml  Net 1220 ml   Filed Weights   12/02/17 1821 12/03/17 2139 12/04/17 0454  Weight: 106.1 kg (234 lb) 106.6 kg (235 lb 0.2 oz) 107 kg (235 lb 14.3 oz)   Examination: Physical Exam:  Constitutional: WN/WD pleasant Caucasian male in NAD.  Eyes: Sclerae anicteric. Lids appear normal ENMT: External  Ears and nose appear normal. Grossly normal hearing Respiratory: CTAB; No appreciable wheezing/rales/rhonchi. Patient had unlabored breathing Cardiovascular: RRR; No appreciable m/r/g. 1+ LE Edema Abdomen: Soft, NT, ND. Bowel sounds present GU: Deferred Musculoskeletal: No contractures; No cyanosis Skin: Warm and dry; Has multiple skin lesions on body and has band-aids on from previous biopsies Neurologic: CN 2-12 grossly intact. No appreciable focal deficits Psychiatric: Impaired judgement and insight. Awake and alert. Normal and pleasant mood and affect  Data Reviewed: I have personally reviewed following labs and imaging studies  CBC: Recent Labs  Lab 12/02/17 1823 12/03/17 0339 12/04/17 0903 12/05/17 0737  WBC 10.9* 10.6* 9.4 10.2    NEUTROABS 7.7  --  6.0 7.7  HGB 15.3 13.7 12.4* 12.2*  HCT 46.5 41.9 38.1* 37.1*  MCV 91.4 91.9 92.0 92.5  PLT 171 157 122* 563*   Basic Metabolic Panel: Recent Labs  Lab 12/02/17 1823 12/03/17 0339 12/04/17 0903 12/05/17 0737  NA 134* 135 137 138  K 4.4 5.0 4.0 5.0  CL 102 105 108 106  CO2 20* _0 GLUCOSE 251* 300* 101* 272*  BUN 37* 38* 32* 40*  CREATININE 2.26* 2.25* 2.00* 2.23*  CALCIUM 8.7* 8.6* 8.5* 8.5*  MG  --   --  2.2 2.2  PHOS  --   --  2.8 4.6   GFR: Estimated Creatinine Clearance: 30.7 mL/min (A) (by C-G formula based on SCr of 2.23 mg/dL (H)). Liver Function Tests: Recent Labs  Lab 12/04/17 0903 12/05/17 0737  AST 22 22  ALT 12* 14*  ALKPHOS 29* 29*  BILITOT 0.8 1.3*  PROT 6.3* 6.5  ALBUMIN 2.9* 2.9*   No results for input(s): LIPASE, AMYLASE in the last 168 hours. No results for input(s): AMMONIA in the last 168 hours. Coagulation Profile: Recent Labs  Lab 12/02/17 1823  INR 1.02   Cardiac Enzymes: No results for input(s): CKTOTAL, CKMB, CKMBINDEX, TROPONINI in the last 168 hours. BNP (last 3 results) Recent Labs    03/07/17 1255  PROBNP 379.0*   HbA1C: No results for input(s): HGBA1C in the last 72 hours. CBG: Recent Labs  Lab 12/04/17 1338 12/04/17 1745 12/04/17 2211 12/05/17 0707 12/05/17 1217  GLUCAP 93 125* 191* 230* 272*   Lipid Profile: Recent Labs    12/05/17 0737  CHOL 104  HDL 34*  LDLCALC 47  TRIG 116  CHOLHDL 3.1   Thyroid Function Tests: No results for input(s): TSH, T4TOTAL, FREET4, T3FREE, THYROIDAB in the last 72 hours. Anemia Panel: No results for input(s): VITAMINB12, FOLATE, FERRITIN, TIBC, IRON, RETICCTPCT in the last 72 hours. Sepsis Labs: No results for input(s): PROCALCITON, LATICACIDVEN in the last 168 hours.  Recent Results (from the past 240 hour(s))  Surgical PCR screen     Status: Abnormal   Collection Time: 12/03/17  5:25 PM  Result Value Ref Range Status   MRSA, PCR POSITIVE (A)  NEGATIVE Final   Staphylococcus aureus POSITIVE (A) NEGATIVE Final    Comment: RESULT CALLED TO, READ BACK BY AND VERIFIED WITH: A STEVENS,RN AT 1938 12/03/17 BY L BENFIELD (NOTE) The Xpert SA Assay (FDA approved for NASAL specimens in patients 67 years of age and older), is one component of a comprehensive surveillance program. It is not intended to diagnose infection nor to guide or monitor treatment.     Radiology Studies: Dg C-arm 1-60 Min  Result Date: 12/04/2017 CLINICAL DATA:  Internal fixation of a intertrochanteric fracture. EXAM: OPERATIVE right HIP (WITH PELVIS IF PERFORMED) 2  VIEWS TECHNIQUE: Fluoroscopic spot image(s) were submitted for interpretation post-operatively. COMPARISON:  Radiographs 12/02/2017 FINDINGS: There is an intramedullary gamma nail with a proximal dynamic hip screw and a distal interlocking screw transfixing the intertrochanteric fracture. Anatomic reduction without complicating features. IMPRESSION: Close reduction and internal fixation of a right hip intertrochanteric fracture with anatomic reduction. Electronically Signed   By: Marijo Sanes M.D.   On: 12/04/2017 19:28   Dg Hip Operative Unilat W Or W/o Pelvis Right  Result Date: 12/04/2017 CLINICAL DATA:  Internal fixation of a intertrochanteric fracture. EXAM: OPERATIVE right HIP (WITH PELVIS IF PERFORMED) 2 VIEWS TECHNIQUE: Fluoroscopic spot image(s) were submitted for interpretation post-operatively. COMPARISON:  Radiographs 12/02/2017 FINDINGS: There is an intramedullary gamma nail with a proximal dynamic hip screw and a distal interlocking screw transfixing the intertrochanteric fracture. Anatomic reduction without complicating features. IMPRESSION: Close reduction and internal fixation of a right hip intertrochanteric fracture with anatomic reduction. Electronically Signed   By: Marijo Sanes M.D.   On: 12/04/2017 19:28   Scheduled Meds: . amLODipine  5 mg Oral Daily  . aspirin EC  325 mg Oral Q breakfast    . aspirin EC  81 mg Oral QHS  . enoxaparin (LOVENOX) injection  40 mg Subcutaneous Q24H  . insulin aspart  0-20 Units Subcutaneous TID WC  . insulin aspart  0-5 Units Subcutaneous QHS  . insulin glargine  25 Units Subcutaneous Daily  . isosorbide mononitrate  30 mg Oral Daily  . rosuvastatin  20 mg Oral QHS  . senna  1 tablet Oral BID  . sertraline  25 mg Oral Daily  . tamsulosin  0.4 mg Oral Daily   Continuous Infusions: . sodium chloride 50 mL/hr at 12/04/17 2255    LOS: 3 days   Kerney Elbe, DO [facility] Pager [phone]  If 7PM-7AM, please contact night-coverage [email or web address] Password [facility] 12/05/2017, 2:46 PM

## 2017-12-06 ENCOUNTER — Inpatient Hospital Stay (HOSPITAL_COMMUNITY): Payer: Medicare Other

## 2017-12-06 DIAGNOSIS — I2510 Atherosclerotic heart disease of native coronary artery without angina pectoris: Secondary | ICD-10-CM

## 2017-12-06 DIAGNOSIS — T17908D Unspecified foreign body in respiratory tract, part unspecified causing other injury, subsequent encounter: Secondary | ICD-10-CM

## 2017-12-06 LAB — COMPREHENSIVE METABOLIC PANEL WITH GFR
ALT: 13 U/L — ABNORMAL LOW (ref 17–63)
AST: 21 U/L (ref 15–41)
Albumin: 2.5 g/dL — ABNORMAL LOW (ref 3.5–5.0)
Alkaline Phosphatase: 30 U/L — ABNORMAL LOW (ref 38–126)
Anion gap: 10 (ref 5–15)
BUN: 58 mg/dL — ABNORMAL HIGH (ref 6–20)
CO2: 19 mmol/L — ABNORMAL LOW (ref 22–32)
Calcium: 8 mg/dL — ABNORMAL LOW (ref 8.9–10.3)
Chloride: 107 mmol/L (ref 101–111)
Creatinine, Ser: 2.79 mg/dL — ABNORMAL HIGH (ref 0.61–1.24)
GFR calc Af Amer: 22 mL/min — ABNORMAL LOW
GFR calc non Af Amer: 19 mL/min — ABNORMAL LOW
Glucose, Bld: 253 mg/dL — ABNORMAL HIGH (ref 65–99)
Potassium: 4.7 mmol/L (ref 3.5–5.1)
Sodium: 136 mmol/L (ref 135–145)
Total Bilirubin: 0.8 mg/dL (ref 0.3–1.2)
Total Protein: 5.7 g/dL — ABNORMAL LOW (ref 6.5–8.1)

## 2017-12-06 LAB — CBC WITH DIFFERENTIAL/PLATELET
BASOS PCT: 0 %
Basophils Absolute: 0 10*3/uL (ref 0.0–0.1)
Eosinophils Absolute: 0.1 10*3/uL (ref 0.0–0.7)
Eosinophils Relative: 1 %
HCT: 31.6 % — ABNORMAL LOW (ref 39.0–52.0)
Hemoglobin: 10.5 g/dL — ABNORMAL LOW (ref 13.0–17.0)
Lymphocytes Relative: 23 %
Lymphs Abs: 2.5 10*3/uL (ref 0.7–4.0)
MCH: 31.3 pg (ref 26.0–34.0)
MCHC: 33.2 g/dL (ref 30.0–36.0)
MCV: 94.3 fL (ref 78.0–100.0)
MONO ABS: 0.4 10*3/uL (ref 0.1–1.0)
Monocytes Relative: 4 %
NEUTROS PCT: 72 %
Neutro Abs: 7.8 10*3/uL — ABNORMAL HIGH (ref 1.7–7.7)
PLATELETS: 131 10*3/uL — AB (ref 150–400)
RBC: 3.35 MIL/uL — ABNORMAL LOW (ref 4.22–5.81)
RDW: 13.9 % (ref 11.5–15.5)
WBC: 10.8 10*3/uL — AB (ref 4.0–10.5)

## 2017-12-06 LAB — PROCALCITONIN: PROCALCITONIN: 0.3 ng/mL

## 2017-12-06 LAB — GLUCOSE, CAPILLARY
GLUCOSE-CAPILLARY: 266 mg/dL — AB (ref 65–99)
GLUCOSE-CAPILLARY: 259 mg/dL — AB (ref 65–99)
GLUCOSE-CAPILLARY: 301 mg/dL — AB (ref 65–99)
Glucose-Capillary: 214 mg/dL — ABNORMAL HIGH (ref 65–99)

## 2017-12-06 LAB — MAGNESIUM: Magnesium: 2.2 mg/dL (ref 1.7–2.4)

## 2017-12-06 LAB — PHOSPHORUS: Phosphorus: 3.9 mg/dL (ref 2.5–4.6)

## 2017-12-06 MED ORDER — INSULIN GLARGINE 100 UNIT/ML ~~LOC~~ SOLN
25.0000 [IU] | Freq: Two times a day (BID) | SUBCUTANEOUS | Status: DC
  Administered 2017-12-06 – 2017-12-07 (×2): 25 [IU] via SUBCUTANEOUS
  Filled 2017-12-06 (×3): qty 0.25

## 2017-12-06 NOTE — Progress Notes (Signed)
Modified Barium Swallow Progress Note  Patient Details  Name: Frank Simpson MRN: [id number] Date of Birth: [birth year]  Today's Date: 12/06/2017  Modified Barium Swallow completed.  Full report located under Chart Review in the Imaging Section.  Brief recommendations include the following:  Clinical Impression  Pt demonstrates swallow function not outside of normal limits for age. Strength is WNL. Timing of swallow is slightly delayed with only one instance of trace aspiration just to the bottom of the cords with consecutive sips of thin liquids out of 6 oz of intake. Pt briefly orally holds bolus which also seems to help with more controlled initiation of swallow. The pt is also able to masticate adequately, though wife reports he is highly impulsive with meals and needs assist. Given recent choking episode at bedside, will resume a puree diet with thin liquids for now and advance as pt is able to demonstrate safe rate and adequate level of alertness for meals. Pt must be fully upright to be successful.    Swallow Evaluation Recommendations       SLP Diet Recommendations: Dysphagia 1 (Puree) solids;Thin liquid   Liquid Administration via: Cup;Straw   Medication Administration: Whole meds with liquid   Supervision: Patient able to self feed   Compensations: Slow rate;Small sips/bites;Minimize environmental distractions   Postural Changes: Seated upright at 90 degrees   Oral Care Recommendations: Oral care BID       Herbie Baltimore, MA CCC-SLP [phone]  Keishia Ground, Katherene Ponto 12/06/2017,2:30 PM
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
## 2017-12-06 NOTE — Progress Notes (Signed)
Patient on 4L [place] sats maintaining around 97%. Lungs clear and diminished. Resting comfortably without any complaints.
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
## 2017-12-06 NOTE — Progress Notes (Signed)
Inpatient Diabetes Program Recommendations  AACE/ADA: New Consensus Statement on Inpatient Glycemic Control (2015)  Target Ranges:  Prepandial:   less than 140 mg/dL      Peak postprandial:   less than 180 mg/dL (1-2 hours)      Critically ill patients:  140 - 180 mg/dL   Results for CHEE, Frank Simpson (MRN [id number]) as of 12/06/2017 11:34  Ref. Range 12/05/2017 07:07 12/05/2017 12:17 12/05/2017 16:47 12/05/2017 22:26 12/06/2017 06:27  Glucose-Capillary Latest Ref Range: 65 - 99 mg/dL 230 (H) 272 (H) 338 (H) 310 (H) 266 (H)   Review of Glycemic Control  Diabetes history: DM2 Outpatient Diabetes medications: Basaglar 50 units daily, Humalog 4 units with breakfast, 8 units with lunch and supper Current orders for Inpatient glycemic control: Lantus 25 units daily, Novolog 0-20 units TID with meals, Novolog 0-5 units QHS  Inpatient Diabetes Program Recommendations: Insulin - Basal: Please consider increasing Lantus to 30 units daily. Insulin - Meal Coverage: Once diet is resumed, please consider ordering Novolog 5 units TID with meals for meal coverage if patient eats at least 50% of meals.  Thanks, Barnie Alderman, RN, MSN, CDE Diabetes Coordinator Inpatient Diabetes Program [phone] (Team Pager from 8am to 5pm)

## 2017-12-06 NOTE — Progress Notes (Signed)
Called for an update, sats having been maintaining on 4L [place]. Call RR if needed.

## 2017-12-07 ENCOUNTER — Inpatient Hospital Stay (HOSPITAL_COMMUNITY): Payer: Medicare Other

## 2017-12-07 LAB — CBC
HCT: 31.5 % — ABNORMAL LOW (ref 39.0–52.0)
Hemoglobin: 10.3 g/dL — ABNORMAL LOW (ref 13.0–17.0)
MCH: 30.7 pg (ref 26.0–34.0)
MCHC: 32.7 g/dL (ref 30.0–36.0)
MCV: 94 fL (ref 78.0–100.0)
PLATELETS: 138 10*3/uL — AB (ref 150–400)
RBC: 3.35 MIL/uL — ABNORMAL LOW (ref 4.22–5.81)
RDW: 13.8 % (ref 11.5–15.5)
WBC: 7.9 10*3/uL (ref 4.0–10.5)

## 2017-12-07 LAB — BASIC METABOLIC PANEL
ANION GAP: 7 (ref 5–15)
BUN: 53 mg/dL — AB (ref 6–20)
CALCIUM: 8.1 mg/dL — AB (ref 8.9–10.3)
CO2: 23 mmol/L (ref 22–32)
Chloride: 110 mmol/L (ref 101–111)
Creatinine, Ser: 2.38 mg/dL — ABNORMAL HIGH (ref 0.61–1.24)
GFR calc Af Amer: 27 mL/min — ABNORMAL LOW (ref >60)
GFR, EST NON AFRICAN AMERICAN: 23 mL/min — AB (ref >60)
GLUCOSE: 208 mg/dL — AB (ref 65–99)
Potassium: 4.2 mmol/L (ref 3.5–5.1)
SODIUM: 140 mmol/L (ref 135–145)

## 2017-12-07 LAB — GLUCOSE, CAPILLARY
GLUCOSE-CAPILLARY: 355 mg/dL — AB (ref 65–99)
GLUCOSE-CAPILLARY: 380 mg/dL — AB (ref 65–99)
GLUCOSE-CAPILLARY: 323 mg/dL — AB (ref 65–99)
Glucose-Capillary: 195 mg/dL — ABNORMAL HIGH (ref 65–99)
Glucose-Capillary: 251 mg/dL — ABNORMAL HIGH (ref 65–99)

## 2017-12-07 MED ORDER — FLUTICASONE PROPIONATE 50 MCG/ACT NA SUSP
1.0000 | Freq: Two times a day (BID) | NASAL | Status: DC
  Administered 2017-12-07 – 2017-12-08 (×3): 1 via NASAL
  Filled 2017-12-07: qty 16

## 2017-12-07 MED ORDER — INSULIN GLARGINE 100 UNIT/ML ~~LOC~~ SOLN
30.0000 [IU] | Freq: Two times a day (BID) | SUBCUTANEOUS | Status: DC
  Administered 2017-12-07 – 2017-12-08 (×2): 30 [IU] via SUBCUTANEOUS
  Filled 2017-12-07 (×3): qty 0.3

## 2017-12-07 MED ORDER — METRONIDAZOLE 500 MG PO TABS
500.0000 mg | ORAL_TABLET | Freq: Three times a day (TID) | ORAL | Status: DC
  Administered 2017-12-07 – 2017-12-08 (×5): 500 mg via ORAL
  Filled 2017-12-07 (×5): qty 1

## 2017-12-07 NOTE — Social Work (Signed)
CSW followed with patient/spouse at bedside and they have accepted SNF bed offer from [facility].  CSW contacted SNF and they indicated that they will review and f/u with CSW.  CSW will continue to follow up.  Elissa Hefty, LCSW Clinical Social Worker [phone]

## 2017-12-07 NOTE — Clinical Social Work Note (Signed)
Clinical Social Work Assessment  Patient Details  Name: Frank Simpson MRN: [id number] Date of Birth: 04/23/1931  Date of referral:  12/07/17               Reason for consult:  Facility Placement                Permission sought to share information with:  Case Manager Permission granted to share information::  Yes, Verbal Permission Granted  Name::     Frank Simpson  Agency::  SNF  Relationship::  spouse  Contact Information:     Housing/Transportation Living arrangements for the past 2 months:  Single Family Home Source of Information:  Spouse Patient Interpreter Needed:  None Criminal Activity/Legal Involvement Pertinent to Current Situation/Hospitalization:  No - Comment as needed Significant Relationships:  Spouse, Other Family Members Lives with:  Spouse Do you feel safe going back to the place where you live?  No Need for family participation in patient care:  Yes (Comment)  Care giving concerns:  Patient from home with spouse, given new impairment, spouse unable to care for him at home and agrees with recommendation for short term rehab.  CSW met with spouse and patient at bedside and spouse confirmed that patient has never experienced SNF before. Spouse cares for patient at home and he struggled with ambulation. Patient said very few words during interaction with flat affect. CSW obtained permission to send to local area SNF's to see who can offer a bed. Spouse was interested in IP rehab and CSW indicated that SNF is a back up for placement just in case IP rehab is not an option. Clinical staff has recommended SNF. CSW also explained Insurance and transportation to SNF.  Social Worker assessment / plan:  CSW will f/u for disposition.  Employment status:  Retired Forensic scientist:  Medicare PT Recommendations:  [place] / Referral to community resources:  [place]  Patient/Family's Response to care:  Probation officer of CSW f/u to assist with SNF placement. No other issues or concerns identified at this time.  Patient/Family's Understanding of and Emotional Response to Diagnosis, Current Treatment, and Prognosis:  Patient/spouse identifies that patient will need more support than can be provided at home by wife. Spouse in agreement with SNF placement to address patient physical barriers and will review SNF list. Spouse is concerned about patient's dysphagia and clinical team addressing. CSW discussed long term plan for patient as spouse cannot care for him if he cannot return to baseline. Spouse will determine once patient completes short term rehab. No other issues identified at this time.  Emotional Assessment Appearance:  Appears stated age Attitude/Demeanor/Rapport:  (Cooperative) Affect (typically observed):  Accepting, Appropriate Orientation:  Oriented to Self, Oriented to Place, Oriented to Situation Alcohol / Substance use:  Not Applicable Psych involvement (Current and /or in the community):  No (Comment)  Discharge Needs  Concerns to be addressed:  Discharge Planning Concerns Readmission within the last 30 days:  No Current discharge risk:  Dependent with Mobility, Physical Impairment Barriers to Discharge:  No Barriers Identified   Frank Baxter, LCSW 12/07/2017, 11:07 AM

## 2017-12-07 NOTE — Progress Notes (Signed)
Physical Therapy Treatment Patient Details Name: Frank Simpson MRN: [id number] DOB: 21-Jul-1931 Today's Date: 12/07/2017    History of Present Illness Frank Simpson is a 84 y.o. male with medical history significant of HTN, MI, CAD s/p DES to LAD, Diastolic dysfunction last EF 55-60% with grade 2 dysfunction, CHB s/p PPM, DM type II with peripheral neuropathy, Basal cell skin cancer s/p removal, syncope, and recurrent falls with previous SDH in 10/2016; who presents after having a mechanical fall at home reporting excruciating right leg pain. R femur fracture, now s/p R IM Nail, TWB RLE    PT Comments    Patient required max/total A +2 for functional transfers. Unable to complete squat pivot this session however pt able to stand briefly with assist for balance and maintaining TDWB status R LE. Wife present during session. Continue to progress as tolerated.    Follow Up Recommendations  Other (comment)(Post-acute Rehab)     Equipment Recommendations  Wheelchair (measurements PT);Wheelchair cushion (measurements PT)    Recommendations for Other Services       Precautions / Restrictions Precautions Precautions: Fall Restrictions Weight Bearing Restrictions: Yes RLE Weight Bearing: Touchdown weight bearing    Mobility  Bed Mobility Overal bed mobility: Needs Assistance Bed Mobility: Supine to Sit;Sit to Supine     Supine to sit: +2 for physical assistance;Max assist Sit to supine: Max assist;+2 for physical assistance   General bed mobility comments: cues for sequencing; assist to bring bilat LE in/out of bed, scoot hips with use of bed pad, and elevate trunk into sitting; attempted to scoot up to L side and toward [facility] with pt requiring +2 assist for sitting balance with anterior translation of trunk and scooting hips with use of bed pad  Transfers Overall transfer level: Needs assistance Equipment used: 2 person hand held assist Transfers: Sit to/from Stand Sit to Stand:  Max assist;Total assist;+2 physical assistance;From elevated surface         General transfer comment: sit to stand attempted with +2 assist using gait belt and bed pad to list hips; pt not assisting enough to achieve getting buttocks from bed; second trial pt pulled on back of recliner with therapist assisting to maintain R LE TDWB and +2 with gait belt and bed pad; pt able to stand briefly with flexed trunk however began bearing too much wieght on R LE  Ambulation/Gait                 Stairs            Wheelchair Mobility    Modified Rankin (Stroke Patients Only)       Balance Overall balance assessment: Needs assistance Sitting-balance support: Bilateral upper extremity supported;Feet supported Sitting balance-Leahy Scale: Poor Sitting balance - Comments: Needs occasional external support for sitting balance; antalgic L lean Postural control: Left lateral lean   Standing balance-Leahy Scale: Zero                              Cognition Arousal/Alertness: Awake/alert(dozing off end of session ) Behavior During Therapy: Flat affect Overall Cognitive Status: Impaired/Different from baseline Area of Impairment: Attention;Following commands;Awareness;Problem solving                   Current Attention Level: Sustained   Following Commands: Follows one step commands with increased time   Awareness: Intellectual Problem Solving: Slow processing;Decreased initiation;Difficulty sequencing;Requires verbal cues;Requires tactile cues General Comments: Pt with  decreased initiation when completing ADLs, requires one step commands and repetition to complete simple grooming tasks; Pt's wife reporting she suspects partly due to pain meds      Exercises General Exercises - Lower Extremity Heel Slides: AAROM;Right;5 reps;Supine    General Comments General comments (skin integrity, edema, etc.): SpO2 93% or > on RA      Pertinent Vitals/Pain Pain  Assessment: Faces Faces Pain Scale: Hurts even more Pain Location: R hip  Pain Descriptors / Indicators: Grimacing;Guarding;Moaning Pain Intervention(s): Limited activity within patient's tolerance;Monitored during session;Repositioned;Ice applied    Home Living                      Prior Function            PT Goals (current goals can now be found in the care plan section) Acute Rehab PT Goals Patient Stated Goal: did not state PT Goal Formulation: With patient Time For Goal Achievement: 12/19/17 Potential to Achieve Goals: Fair Progress towards PT goals: Progressing toward goals    Frequency    Min 3X/week      PT Plan Current plan remains appropriate    Co-evaluation              AM-PAC PT "6 Clicks" Daily Activity  Outcome Measure  Difficulty turning over in bed (including adjusting bedclothes, sheets and blankets)?: Unable Difficulty moving from lying on back to sitting on the side of the bed? : Unable Difficulty sitting down on and standing up from a chair with arms (e.g., wheelchair, bedside commode, etc,.)?: Unable Help needed moving to and from a bed to chair (including a wheelchair)?: Total Help needed walking in hospital room?: Total Help needed climbing 3-5 steps with a railing? : Total 6 Click Score: 6    End of Session Equipment Utilized During Treatment: Gait belt Activity Tolerance: Patient limited by pain Patient left: with call bell/phone within reach;in bed;with bed alarm set;with family/visitor present;with SCD's reapplied Nurse Communication: Mobility status PT Visit Diagnosis: Other abnormalities of gait and mobility (R26.89);Repeated falls (R29.6);Muscle weakness (generalized) (M62.81);History of falling (Z91.81);Pain Pain - Right/Left: Right Pain - part of body: Hip     Time: 1000-1038 PT Time Calculation (min) (ACUTE ONLY): 38 min  Charges:  $Therapeutic Activity: 38-52 mins                    G Codes:       Earney Navy, PTA Pager: [phone]     Darliss Cheney 12/07/2017, 11:05 AM

## 2017-12-07 NOTE — NC FL2 (Signed)
[place] LEVEL OF CARE SCREENING TOOL     IDENTIFICATION  Patient Name: Frank Simpson Birthdate: August 29, 1931 Sex: male Admission Date (Current Location): 12/02/2017  [facility] and [place] Number:  Herbalist and Address:  [facility] [place], [place] [address] [place], [place] [address]      Provider Number: [id number]  Attending Physician Name and Address:  Frank Lew, MD  Relative Name and Phone Number:  Frank Simpson, cell, [phone]    Current Level of Care: Hospital Recommended Level of Care: [place] Prior Approval Number:    Date Approved/Denied:   PASRR Number: [id number]  Discharge Plan: SNF    Current Diagnoses: Patient Active Problem List   Diagnosis Date Noted  . Chronic diastolic CHF (congestive heart failure) ([place]) 12/03/2017  . Leukocytosis 12/03/2017  . Displaced intertrochanteric fracture of right femur, initial encounter for closed fracture ([place])   . Preoperative cardiovascular examination   . Fracture of femoral neck, right ([place]) 12/02/2017  . Medicare annual wellness visit, subsequent 11/14/2017  . Orthostatic headache 11/08/2017  . UTI (urinary tract infection) 03/17/2017  . Pharyngeal dysphagia 03/17/2017  . Pedal edema 03/07/2017  . Right knee injury, subsequent encounter 12/16/2016  . SDH (subdural hematoma) ([place]) 11/08/2016  . Cough 08/15/2016  . Advanced care planning/counseling discussion 05/16/2016  . Hypertensive retinopathy   . Onychomycosis of right great toe 06/11/2015  . Pacemaker 01/11/2015  . Gait disorder 10/07/2014  . Traumatic brain injury with loss of consciousness of 31 minutes to 59 minutes ([place]) 10/03/2014  . Junctional bradycardia 10/03/2014  . Benign positional vertigo 09/25/2014  . Medicare annual wellness visit, initial 07/09/2014  . MDD (major depressive disorder), recurrent episode, severe ([place]) 07/09/2014  . B12 deficiency 06/11/2014  . Frequent  falls 04/23/2014  . Diabetes mellitus type 2 with retinopathy ([place]) 02/19/2014  . Rash of face 01/03/2014  . Shoulder injury 07/10/2013  . Memory loss 04/16/2013  . Syncope 04/02/2013  . Post herpetic neuralgia 02/26/2013  . Unstable angina ([place]) 10/15/2012  . CAD (coronary artery disease) 10/15/2012  . Chronic systolic heart failure ([place]) 10/15/2012  . Thrombocytopenia ([place])   . STEMI (ST elevation myocardial infarction) ([place]) 08/06/2012  . Osteoarthritis   . CKD stage 3 due to type 2 diabetes mellitus ([place])   . Urge urinary incontinence   . Type 2 diabetes, controlled, with neuropathy ([place]) 07/18/2011  . Hypercholesterolemia 07/18/2011    Orientation RESPIRATION BLADDER Height & Weight     Self, Situation, Place  O2(Nasal Cannula) Continent Weight: 235 lb 14.3 oz (107 kg) Height:  5' 11" (180.3 cm)  BEHAVIORAL SYMPTOMS/MOOD NEUROLOGICAL BOWEL NUTRITION STATUS      Continent Diet(See DC Summary)  AMBULATORY STATUS COMMUNICATION OF NEEDS Skin   Extensive Assist Verbally Surgical wounds                       Personal Care Assistance Level of Assistance  Dressing, Bathing, Feeding Bathing Assistance: Maximum assistance Feeding assistance: Limited assistance Dressing Assistance: Maximum assistance     Functional Limitations Info  Sight, Hearing, Speech Sight Info: Adequate Hearing Info: Adequate Speech Info: Impaired    SPECIAL CARE FACTORS FREQUENCY  PT (By licensed PT), OT (By licensed OT)     PT Frequency: 5x week OT Frequency: 5x week            Contractures      Additional Factors Info  Code Status, Allergies, Insulin Sliding  Scale, Isolation Precautions, Psychotropic Code Status Info: Full Allergies Info: METFORMIN AND RELATED, OXYCODONE, JANUVIA SITAGLIPTIN PHOSPHATE, CODEINE, PENICILLINS  Psychotropic Info: Zoloft Insulin Sliding Scale Info: Insulin daily Isolation Precautions Info: MRSA     Current Medications (12/07/2017):  This is the  current hospital active medication list Current Facility-Administered Medications  Medication Dose Route Frequency Provider Last Rate Last Dose  . 0.9 %  sodium chloride infusion   Intravenous Continuous Newt Minion, MD 50 mL/hr at 12/04/17 2255    . acetaminophen (TYLENOL) tablet 650 mg  650 mg Oral Q6H PRN Newt Minion, MD       Or  . acetaminophen (TYLENOL) suppository 650 mg  650 mg Rectal Q6H PRN Newt Minion, MD      . amLODipine (NORVASC) tablet 5 mg  5 mg Oral Daily Raiford Noble [place], DO   5 mg at 12/06/17 1209  . aspirin EC tablet 325 mg  325 mg Oral Q breakfast Newt Minion, MD   325 mg at 12/06/17 1208  . aspirin EC tablet 81 mg  81 mg Oral QHS Fuller Plan A, MD   81 mg at 12/06/17 2253  . cefTRIAXone (ROCEPHIN) 2 g in dextrose 5 % 50 mL IVPB  2 g Intravenous Q24H Raiford Noble [place], DO 100 mL/hr at 12/06/17 1759 2 g at 12/06/17 1759  . enoxaparin (LOVENOX) injection 40 mg  40 mg Subcutaneous Q24H Fuller Plan A, MD   40 mg at 12/06/17 2253  . HYDROcodone-acetaminophen (NORCO/VICODIN) 5-325 MG per tablet 1-2 tablet  1-2 tablet Oral Q6H PRN Newt Minion, MD   2 tablet at 12/06/17 1759  . insulin aspart (novoLOG) injection 0-20 Units  0-20 Units Subcutaneous TID WC Norval Morton, MD   4 Units at 12/07/17 0612  . insulin aspart (novoLOG) injection 0-5 Units  0-5 Units Subcutaneous QHS Norval Morton, MD   4 Units at 12/06/17 2308  . insulin glargine (LANTUS) injection 25 Units  25 Units Subcutaneous BID Patrecia Pour, Christean Grief, MD   25 Units at 12/06/17 2253  . isosorbide mononitrate (IMDUR) 24 hr tablet 30 mg  30 mg Oral Daily Tamala Julian, Rondell A, MD   30 mg at 12/06/17 1209  . menthol-cetylpyridinium (CEPACOL) lozenge 3 mg  1 lozenge Oral PRN Newt Minion, MD       Or  . phenol (CHLORASEPTIC) mouth spray 1 spray  1 spray Mouth/Throat PRN Newt Minion, MD      . metoCLOPramide (REGLAN) tablet 5-10 mg  5-10 mg Oral Q8H PRN Newt Minion, MD       Or  .  metoCLOPramide (REGLAN) injection 5-10 mg  5-10 mg Intravenous Q8H PRN Newt Minion, MD      . metroNIDAZOLE (FLAGYL) IVPB 500 mg  500 mg Intravenous [address] [place], [place]   Stopped at 12/07/17 [id number]  . ondansetron (ZOFRAN) tablet 4 mg  4 mg Oral Q6H PRN Newt Minion, MD       Or  . ondansetron [facility]) injection 4 mg  4 mg Intravenous Q6H PRN Newt Minion, MD      . rosuvastatin (CRESTOR) tablet 20 mg  20 mg Oral QHS Smith, Rondell A, MD   20 mg at 12/06/17 2254  . senna (SENOKOT) tablet 8.6 mg  1 tablet Oral BID Fuller Plan A, MD   8.6 mg at 12/06/17 2253  . sertraline (ZOLOFT) tablet 25 mg  25 mg Oral Daily Smith, Rondell A,  MD   25 mg at 12/06/17 1208  . tamsulosin (FLOMAX) capsule 0.4 mg  0.4 mg Oral Daily Fuller Plan A, MD   0.4 mg at 12/06/17 1207     Discharge Medications: Please see discharge summary for a list of discharge medications.  Relevant Imaging Results:  Relevant Lab Results:   Additional Information SS#: [id number]  [place], LCSW

## 2017-12-07 NOTE — Progress Notes (Signed)
  Speech Language Pathology Treatment: Dysphagia  Patient Details Name: Frank Simpson MRN: [id number] DOB: 1931-01-22 Today's Date: 12/07/2017 Time: 7893-8101 SLP Time Calculation (min) (ACUTE ONLY): 14 min  Assessment / Plan / Recommendation Clinical Impression  Reviewed results of MBS with pt and wife. Encouraged specifically upright posture and the benefit of pts brief oral hold. After discussing this, pt began orally holding liquids for much longer period. Recommend pt continue puree diet and thin liquids as mental status is still quite altered, he is significantly debilitated and needs full supervision for PO though he is going to a new setting soon. Pts wife verbalized understanding. Expect diet advancement as mentation and mobility improves.   HPI HPI: Ankith L Lomanis a 84 y.o.malewith medical history significant ofHTN,MI,CAD s/pDES to LAD, Diastolic dysfunction last EF 55-60% with grade 2 dysfunction,CHBs/p PPM,DM type II with peripheral neuropathy, Basal cell skin cancers/premoval, syncope, andrecurrent falls with previous SDHin 10/2016;who presents after having a mechanicalfallat home reportingexcruciating right leg pain. Patient notes that he tripped over his walker which he normally uses at baseline to ambulate. He has a long history of falls, and been noted to be progressively getting weaker. He states that he did hit his head when he fell, but did notloseconsciousness with this fall (notesloss of consciousness with previous falls).  CT scan of the brain and neck showed no acute intracranial or cervical abnormalities. [name] the hipshow a communicated intertrochanteric femoral neck fracture. Patient was given 25 mcgof fentanyl initially for pain and subsequently 1 mg of Dilaudid. Dr. Sharol Given of Orthopedics completed surgical repair 12/04/16. Pt observed to choke with meal, was feeding himself rapidly, has trouble choking at home. Pt has a histoyr of traumatic brain  injury in 2015. Had OP MBS on 03/23/17, showed silent aspiration before the swallow of thin liquids. Pt at the time was healthy otherwise and advised to clear throat after swallowing and complete regular oral care, but if any decline accurred he may need to consume nectar thick liquids as these were tolerated well. CXR clear on admission and after choking event.       SLP Plan  Continue with current plan of care       Recommendations  Diet recommendations: Thin liquid;Dysphagia 1 (puree) Liquids provided via: Cup;Straw Medication Administration: Whole meds with puree Supervision: Full supervision/cueing for compensatory strategies;Trained caregiver to feed patient Compensations: Slow rate;Small sips/bites;Minimize environmental distractions Postural Changes and/or Swallow Maneuvers: Seated upright 90 degrees                Oral Care Recommendations: Oral care BID Follow up Recommendations: Skilled Nursing facility SLP Visit Diagnosis: Dysphagia, oropharyngeal phase (R13.12) Plan: Continue with current plan of care       [place]               [facility], [place] CCC-SLP [phone]  Lynann Beaver 12/07/2017, 12:34 PM
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
## 2017-12-07 NOTE — Progress Notes (Signed)
RT unavail for 8am flutter valve/CPT, charge RT aware.

## 2017-12-08 DIAGNOSIS — S72001A Fracture of unspecified part of neck of right femur, initial encounter for closed fracture: Secondary | ICD-10-CM | POA: Diagnosis not present

## 2017-12-08 DIAGNOSIS — E11319 Type 2 diabetes mellitus with unspecified diabetic retinopathy without macular edema: Secondary | ICD-10-CM | POA: Diagnosis not present

## 2017-12-08 DIAGNOSIS — F0390 Unspecified dementia without behavioral disturbance: Secondary | ICD-10-CM | POA: Diagnosis not present

## 2017-12-08 DIAGNOSIS — Z96651 Presence of right artificial knee joint: Secondary | ICD-10-CM | POA: Diagnosis not present

## 2017-12-08 DIAGNOSIS — S62616A Displaced fracture of proximal phalanx of right little finger, initial encounter for closed fracture: Secondary | ICD-10-CM | POA: Diagnosis not present

## 2017-12-08 DIAGNOSIS — E1122 Type 2 diabetes mellitus with diabetic chronic kidney disease: Secondary | ICD-10-CM | POA: Diagnosis not present

## 2017-12-08 DIAGNOSIS — M6281 Muscle weakness (generalized): Secondary | ICD-10-CM | POA: Diagnosis not present

## 2017-12-08 DIAGNOSIS — S62616D Displaced fracture of proximal phalanx of right little finger, subsequent encounter for fracture with routine healing: Secondary | ICD-10-CM | POA: Diagnosis not present

## 2017-12-08 DIAGNOSIS — J309 Allergic rhinitis, unspecified: Secondary | ICD-10-CM | POA: Diagnosis not present

## 2017-12-08 DIAGNOSIS — E1165 Type 2 diabetes mellitus with hyperglycemia: Secondary | ICD-10-CM | POA: Diagnosis not present

## 2017-12-08 DIAGNOSIS — I10 Essential (primary) hypertension: Secondary | ICD-10-CM | POA: Diagnosis not present

## 2017-12-08 DIAGNOSIS — K5901 Slow transit constipation: Secondary | ICD-10-CM | POA: Diagnosis not present

## 2017-12-08 DIAGNOSIS — S0990XA Unspecified injury of head, initial encounter: Secondary | ICD-10-CM | POA: Diagnosis not present

## 2017-12-08 DIAGNOSIS — Y93F1 Activity, caregiving, bathing: Secondary | ICD-10-CM | POA: Diagnosis not present

## 2017-12-08 DIAGNOSIS — Y92199 Unspecified place in other specified residential institution as the place of occurrence of the external cause: Secondary | ICD-10-CM | POA: Diagnosis not present

## 2017-12-08 DIAGNOSIS — Z950 Presence of cardiac pacemaker: Secondary | ICD-10-CM | POA: Diagnosis not present

## 2017-12-08 DIAGNOSIS — I2510 Atherosclerotic heart disease of native coronary artery without angina pectoris: Secondary | ICD-10-CM | POA: Diagnosis not present

## 2017-12-08 DIAGNOSIS — Z95818 Presence of other cardiac implants and grafts: Secondary | ICD-10-CM | POA: Diagnosis not present

## 2017-12-08 DIAGNOSIS — Y92129 Unspecified place in nursing home as the place of occurrence of the external cause: Secondary | ICD-10-CM | POA: Diagnosis not present

## 2017-12-08 DIAGNOSIS — E1143 Type 2 diabetes mellitus with diabetic autonomic (poly)neuropathy: Secondary | ICD-10-CM | POA: Diagnosis not present

## 2017-12-08 DIAGNOSIS — Z794 Long term (current) use of insulin: Secondary | ICD-10-CM | POA: Diagnosis not present

## 2017-12-08 DIAGNOSIS — Y999 Unspecified external cause status: Secondary | ICD-10-CM | POA: Diagnosis not present

## 2017-12-08 DIAGNOSIS — N183 Chronic kidney disease, stage 3 (moderate): Secondary | ICD-10-CM | POA: Diagnosis not present

## 2017-12-08 DIAGNOSIS — S72141A Displaced intertrochanteric fracture of right femur, initial encounter for closed fracture: Secondary | ICD-10-CM | POA: Diagnosis not present

## 2017-12-08 DIAGNOSIS — I5042 Chronic combined systolic (congestive) and diastolic (congestive) heart failure: Secondary | ICD-10-CM | POA: Diagnosis not present

## 2017-12-08 DIAGNOSIS — I130 Hypertensive heart and chronic kidney disease with heart failure and stage 1 through stage 4 chronic kidney disease, or unspecified chronic kidney disease: Secondary | ICD-10-CM | POA: Diagnosis not present

## 2017-12-08 DIAGNOSIS — S0003XA Contusion of scalp, initial encounter: Secondary | ICD-10-CM | POA: Diagnosis not present

## 2017-12-08 DIAGNOSIS — F339 Major depressive disorder, recurrent, unspecified: Secondary | ICD-10-CM | POA: Diagnosis not present

## 2017-12-08 DIAGNOSIS — S79911A Unspecified injury of right hip, initial encounter: Secondary | ICD-10-CM | POA: Diagnosis not present

## 2017-12-08 DIAGNOSIS — R531 Weakness: Secondary | ICD-10-CM | POA: Diagnosis not present

## 2017-12-08 DIAGNOSIS — R2689 Other abnormalities of gait and mobility: Secondary | ICD-10-CM | POA: Diagnosis not present

## 2017-12-08 DIAGNOSIS — Z7982 Long term (current) use of aspirin: Secondary | ICD-10-CM | POA: Diagnosis not present

## 2017-12-08 DIAGNOSIS — S0181XA Laceration without foreign body of other part of head, initial encounter: Secondary | ICD-10-CM | POA: Diagnosis not present

## 2017-12-08 DIAGNOSIS — Z4789 Encounter for other orthopedic aftercare: Secondary | ICD-10-CM | POA: Diagnosis not present

## 2017-12-08 DIAGNOSIS — W06XXXA Fall from bed, initial encounter: Secondary | ICD-10-CM | POA: Diagnosis not present

## 2017-12-08 DIAGNOSIS — R05 Cough: Secondary | ICD-10-CM | POA: Diagnosis not present

## 2017-12-08 DIAGNOSIS — Z951 Presence of aortocoronary bypass graft: Secondary | ICD-10-CM | POA: Diagnosis not present

## 2017-12-08 DIAGNOSIS — S62606A Fracture of unspecified phalanx of right little finger, initial encounter for closed fracture: Secondary | ICD-10-CM | POA: Diagnosis not present

## 2017-12-08 DIAGNOSIS — E1140 Type 2 diabetes mellitus with diabetic neuropathy, unspecified: Secondary | ICD-10-CM | POA: Diagnosis not present

## 2017-12-08 DIAGNOSIS — S199XXA Unspecified injury of neck, initial encounter: Secondary | ICD-10-CM | POA: Diagnosis not present

## 2017-12-08 DIAGNOSIS — S728X9A Other fracture of unspecified femur, initial encounter for closed fracture: Secondary | ICD-10-CM | POA: Diagnosis not present

## 2017-12-08 DIAGNOSIS — S72141D Displaced intertrochanteric fracture of right femur, subsequent encounter for closed fracture with routine healing: Secondary | ICD-10-CM | POA: Diagnosis not present

## 2017-12-08 DIAGNOSIS — S299XXA Unspecified injury of thorax, initial encounter: Secondary | ICD-10-CM | POA: Diagnosis not present

## 2017-12-08 DIAGNOSIS — I5032 Chronic diastolic (congestive) heart failure: Secondary | ICD-10-CM | POA: Diagnosis not present

## 2017-12-08 DIAGNOSIS — Y939 Activity, unspecified: Secondary | ICD-10-CM | POA: Diagnosis not present

## 2017-12-08 DIAGNOSIS — R0989 Other specified symptoms and signs involving the circulatory and respiratory systems: Secondary | ICD-10-CM | POA: Diagnosis not present

## 2017-12-08 DIAGNOSIS — E113293 Type 2 diabetes mellitus with mild nonproliferative diabetic retinopathy without macular edema, bilateral: Secondary | ICD-10-CM | POA: Diagnosis not present

## 2017-12-08 DIAGNOSIS — R1312 Dysphagia, oropharyngeal phase: Secondary | ICD-10-CM | POA: Diagnosis not present

## 2017-12-08 DIAGNOSIS — J189 Pneumonia, unspecified organism: Secondary | ICD-10-CM | POA: Diagnosis not present

## 2017-12-08 DIAGNOSIS — N184 Chronic kidney disease, stage 4 (severe): Secondary | ICD-10-CM | POA: Diagnosis not present

## 2017-12-08 DIAGNOSIS — D62 Acute posthemorrhagic anemia: Secondary | ICD-10-CM | POA: Diagnosis not present

## 2017-12-08 DIAGNOSIS — S098XXA Other specified injuries of head, initial encounter: Secondary | ICD-10-CM | POA: Diagnosis not present

## 2017-12-08 DIAGNOSIS — Z79899 Other long term (current) drug therapy: Secondary | ICD-10-CM | POA: Diagnosis not present

## 2017-12-08 DIAGNOSIS — R296 Repeated falls: Secondary | ICD-10-CM | POA: Diagnosis not present

## 2017-12-08 DIAGNOSIS — G4489 Other headache syndrome: Secondary | ICD-10-CM | POA: Diagnosis not present

## 2017-12-08 DIAGNOSIS — S79921A Unspecified injury of right thigh, initial encounter: Secondary | ICD-10-CM | POA: Diagnosis not present

## 2017-12-08 DIAGNOSIS — R29810 Facial weakness: Secondary | ICD-10-CM | POA: Diagnosis not present

## 2017-12-08 DIAGNOSIS — M25551 Pain in right hip: Secondary | ICD-10-CM | POA: Diagnosis not present

## 2017-12-08 DIAGNOSIS — G8911 Acute pain due to trauma: Secondary | ICD-10-CM | POA: Diagnosis not present

## 2017-12-08 DIAGNOSIS — S6991XA Unspecified injury of right wrist, hand and finger(s), initial encounter: Secondary | ICD-10-CM | POA: Diagnosis present

## 2017-12-08 LAB — BASIC METABOLIC PANEL
Anion gap: 10 (ref 5–15)
BUN: 47 mg/dL — AB (ref 6–20)
CHLORIDE: 110 mmol/L (ref 101–111)
CO2: 21 mmol/L — ABNORMAL LOW (ref 22–32)
Calcium: 8.3 mg/dL — ABNORMAL LOW (ref 8.9–10.3)
Creatinine, Ser: 2.15 mg/dL — ABNORMAL HIGH (ref 0.61–1.24)
GFR calc non Af Amer: 26 mL/min — ABNORMAL LOW (ref >60)
GFR, EST AFRICAN AMERICAN: 31 mL/min — AB (ref >60)
Glucose, Bld: 177 mg/dL — ABNORMAL HIGH (ref 65–99)
POTASSIUM: 4.2 mmol/L (ref 3.5–5.1)
SODIUM: 141 mmol/L (ref 135–145)

## 2017-12-08 LAB — CBC WITH DIFFERENTIAL/PLATELET
BASOS ABS: 0 10*3/uL (ref 0.0–0.1)
BASOS PCT: 0 %
EOS ABS: 0.3 10*3/uL (ref 0.0–0.7)
Eosinophils Relative: 4 %
HCT: 30.1 % — ABNORMAL LOW (ref 39.0–52.0)
HEMOGLOBIN: 9.4 g/dL — AB (ref 13.0–17.0)
LYMPHS ABS: 2 10*3/uL (ref 0.7–4.0)
Lymphocytes Relative: 26 %
MCH: 29.3 pg (ref 26.0–34.0)
MCHC: 31.2 g/dL (ref 30.0–36.0)
MCV: 93.8 fL (ref 78.0–100.0)
Monocytes Absolute: 0.8 10*3/uL (ref 0.1–1.0)
Monocytes Relative: 10 %
NEUTROS PCT: 60 %
Neutro Abs: 4.7 10*3/uL (ref 1.7–7.7)
PLATELETS: 165 10*3/uL (ref 150–400)
RBC: 3.21 MIL/uL — AB (ref 4.22–5.81)
RDW: 13.5 % (ref 11.5–15.5)
WBC: 7.9 10*3/uL (ref 4.0–10.5)

## 2017-12-08 LAB — GLUCOSE, CAPILLARY
GLUCOSE-CAPILLARY: 168 mg/dL — AB (ref 65–99)
Glucose-Capillary: 292 mg/dL — ABNORMAL HIGH (ref 65–99)

## 2017-12-08 LAB — PROCALCITONIN: PROCALCITONIN: 0.25 ng/mL

## 2017-12-08 MED ORDER — HYDROCODONE-ACETAMINOPHEN 5-325 MG PO TABS: 1.0000 | ORAL_TABLET | Freq: Four times a day (QID) | ORAL | 0 refills | Status: DC | PRN

## 2017-12-08 MED ORDER — INSULIN LISPRO 100 UNIT/ML (KWIKPEN): PEN_INJECTOR | SUBCUTANEOUS | 3 refills | Status: DC

## 2017-12-08 MED ORDER — FLUTICASONE PROPIONATE 50 MCG/ACT NA SUSP: 1.0000 | Freq: Two times a day (BID) | NASAL | 2 refills | Status: AC

## 2017-12-08 MED ORDER — BASAGLAR KWIKPEN 100 UNIT/ML ~~LOC~~ SOPN: 35.0000 [IU] | PEN_INJECTOR | Freq: Two times a day (BID) | SUBCUTANEOUS | 3 refills | Status: DC

## 2017-12-08 NOTE — Progress Notes (Signed)
Inpatient Diabetes Program Recommendations  AACE/ADA: New Consensus Statement on Inpatient Glycemic Control (2015)  Target Ranges:  Prepandial:   less than 140 mg/dL      Peak postprandial:   less than 180 mg/dL (1-2 hours)      Critically ill patients:  140 - 180 mg/dL   Results for GARIN, MATA (MRN [id number]) as of 12/08/2017 09:55  Ref. Range 12/07/2017 05:40 12/07/2017 11:54 12/07/2017 17:41 12/07/2017 18:19 12/07/2017 21:18  Glucose-Capillary Latest Ref Range: 65 - 99 mg/dL 195 (H) 251 (H) 380 (H) 355 (H) 323 (H)    Home DM Meds: Basaglar 50 units daily       Humalog 4 units Breakfast/ 8 units Lunch/ 8 units Dinner       Humalog SSI   Current Insulin Orders: Lantus 30 units BID      Novolog Resistant Correction Scale/ SSI (0-20 units) TID AC + HS       MD- Note Lantus increased to 30 units BID yesterday.  Pt received a total of 55 units Lantus yesterday (01/10).  Will get a total of 60 units Lantus today.  Note that patient takes Humalog with meals at home.  Please consider starting Novolog Meal Coverage:  Novolog 6 units TID with meals (hold if pt eats <50% of meal)      --Will follow patient during hospitalization--  Wyn Quaker RN, MSN, CDE Diabetes Coordinator Inpatient Glycemic Control Team Team Pager: [phone] (8a-5p)

## 2017-12-08 NOTE — Clinical Social Work Placement (Signed)
CLINICAL SOCIAL WORK PLACEMENT  NOTE  Date:  12/08/2017  Patient Details  Name: Frank Simpson MRN: [id number] Date of Birth: 1931-08-28  Clinical Social Work is seeking post-discharge placement for this patient at the [place] level of care (*CSW will initial, date and re-position this form in  chart as items are completed):  Yes   Patient/family provided with [facility] Work Department's list of facilities offering this level of care within the geographic area requested by the patient (or if unable, by the patient's family).  Yes   Patient/family informed of their freedom to choose among providers that offer the needed level of care, that participate in Medicare, Medicaid or managed care program needed by the patient, have an available bed and are willing to accept the patient.  Yes   Patient/family informed of [facility]'s ownership interest in [facility] and [facility], as well as of the fact that they are under no obligation to receive care at these facilities.  PASRR submitted to EDS on       PASRR number received on 12/07/17     Existing PASRR number confirmed on       FL2 transmitted to all facilities in geographic area requested by pt/family on 12/07/17     FL2 transmitted to all facilities within larger geographic area on       Patient informed that his/her managed care company has contracts with or will negotiate with certain facilities, including the following:        Yes   Patient/family informed of bed offers received.  Patient chooses bed at [place] recommends and patient chooses bed at      Patient to be transferred to [facility] on 12/08/17.  Patient to be transferred to facility by [facility]     Patient family notified on 12/08/17 of transfer.  Name of family member notified:  spouse at bedside     PHYSICIAN       Additional Comment:     _______________________________________________ Normajean Baxter, LCSW 12/08/2017, 2:11 PM

## 2017-12-08 NOTE — Progress Notes (Signed)
Frank Simpson to be D/C'd [facility]illed nursing facility per MD order.  Discussed prescriptions and follow up appointments with the patient. Prescriptions given to patient, medication list explained in detail. Pt verbalized understanding.  Allergies as of 12/08/2017      Reactions   Metformin And Related Other (See Comments)   Affected kidneys   Oxycodone Other (See Comments)   Severe confusion   Januvia [sitagliptin Phosphate] Other (See Comments)   Possibly affected kidneys?   Codeine    Penicillins Other (See Comments)   Reaction long time ago - doesn't remember      Medication List    STOP taking these medications   Insulin Pen Needle 31G X 8 MM Misc Commonly known as:  B-D ULTRAFINE III SHORT PEN   nitroGLYCERIN 0.4 MG SL tablet Commonly known as:  NITROSTAT     TAKE these medications   acetaminophen 500 MG tablet Commonly known as:  TYLENOL Take 1 tablet (500 mg total) by mouth every 6 (six) hours as needed for mild pain. What changed:  reasons to take this   amLODipine 5 MG tablet Commonly known as:  NORVASC Take 1 tablet (5 mg total) by mouth daily.   aspirin EC 325 MG tablet Take 1 tablet (325 mg total) by mouth daily. What changed:    medication strength  how much to take   BASAGLAR KWIKPEN 100 UNIT/ML Sopn Inject 0.35 mLs (35 Units total) into the skin 2 (two) times daily. What changed:    how much to take  when to take this   CLEAR EYES OP Place 1 drop into both eyes 3 (three) times daily.   fluticasone 50 MCG/ACT nasal spray Commonly known as:  FLONASE Place 1 spray into both nostrils 2 (two) times daily.   HYDROcodone-acetaminophen 5-325 MG tablet Commonly known as:  NORCO/VICODIN Take 1-2 tablets by mouth every 6 (six) hours as needed for moderate pain.   insulin lispro 100 UNIT/ML KiwkPen Commonly known as:  HUMALOG KWIKPEN INJECT 3 UNITS  TID with meals What changed:  additional instructions   isosorbide mononitrate 30 MG 24 hr  tablet Commonly known as:  IMDUR Take 1 tablet (30 mg total) by mouth daily.   rosuvastatin 20 MG tablet Commonly known as:  CRESTOR Take 1 tablet (20 mg total) by mouth daily. What changed:  when to take this   sertraline 25 MG tablet Commonly known as:  ZOLOFT Take 1 tablet (25 mg total) by mouth daily.   tamsulosin 0.4 MG Caps capsule Commonly known as:  FLOMAX Take 1 capsule (0.4 mg total) by mouth daily.            Discharge Care Instructions  (From admission, onward)        Start     Ordered   12/04/17 0000  Touch down weight bearing     12/04/17 1743      Vitals:   12/07/17 2113 12/08/17 0637  BP: (!) 147/65 (!) 149/66  Pulse: 94 79  Resp: 20 18  Temp: 99.6 F (37.6 C) 98.9 F (37.2 C)  SpO2: 97% 94%    Skin clean, dry and intact without evidence of skin break down, no evidence of skin tears noted.  2 mepilex dressing on right hip/thigh area, old drainage marked, clean, dry. IV catheter discontinued intact. Site without signs and symptoms of complications. Dressing and pressure applied. Pt denies pain at this time. No complaints noted.  An After Visit Summary and prescriptions were printed and  given to PTAR. Patient escorted via bed, and D/C to SNF via [facility].  [place] RN

## 2017-12-08 NOTE — Social Work (Signed)
CSW spoke with spouse and advised that she will need to go to SNF and complete admission paperwork today. CSW provided contact information for admission staff.  CSW confirmed with SNF.  CSW will facilitate dc to SNF when discharge ready.  Elissa Hefty, LCSW Clinical Social Worker [phone]

## 2017-12-08 NOTE — Discharge Summary (Signed)
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 Mech Soft -- Oral - Regular -- Oral - Multi-Consistency -- Oral - Pill -- Oral Phase - Comment --  CHL IP PHARYNGEAL PHASE 12/06/2017 Pharyngeal Phase Impaired Pharyngeal- Pudding Teaspoon -- Pharyngeal -- Pharyngeal- Pudding Cup -- Pharyngeal -- Pharyngeal- Honey Teaspoon -- Pharyngeal -- Pharyngeal- Honey Cup -- Pharyngeal -- Pharyngeal- Nectar Teaspoon -- Pharyngeal -- Pharyngeal- Nectar Cup Delayed swallow initiation-pyriform sinuses Pharyngeal -- Pharyngeal- Nectar Straw Delayed swallow initiation-pyriform sinuses Pharyngeal -- Pharyngeal- Thin Teaspoon -- Pharyngeal -- Pharyngeal- Thin Cup Delayed swallow initiation-pyriform sinuses Pharyngeal -- Pharyngeal- Thin Straw Delayed swallow initiation-pyriform sinuses;Penetration/Aspiration before swallow;Trace aspiration Pharyngeal Material enters airway, CONTACTS cords and not ejected out;Material does not enter airway Pharyngeal- Puree WFL Pharyngeal -- Pharyngeal- Mechanical Soft -- Pharyngeal -- Pharyngeal- Regular Delayed swallow initiation-vallecula Pharyngeal --  Pharyngeal- Multi-consistency -- Pharyngeal -- Pharyngeal- Pill -- Pharyngeal -- Pharyngeal Comment --  CHL IP CERVICAL ESOPHAGEAL PHASE 12/06/2017 Cervical Esophageal Phase WFL Pudding Teaspoon -- Pudding Cup -- Honey Teaspoon -- Honey Cup -- Nectar Teaspoon -- Nectar Cup -- Nectar Straw -- Thin Teaspoon -- Thin Cup -- Thin Straw -- Puree -- Mechanical Soft -- Regular -- Multi-consistency -- Pill -- Cervical Esophageal Comment -- CHL IP GO  03/23/2017 Functional Assessment Tool Used clinical judgement Functional Limitations Swallowing Swallow Current Status [phone]) CJ Swallow Goal Status (U0454) CJ Swallow Discharge Status (U9811) CJ Motor Speech Current Status [phone]) (None) Motor Speech Goal Status [phone]) (None) Motor Speech Goal Status (Z3086) (None) Spoken Language Comprehension Current Status (V7846) (None) Spoken Language Comprehension Goal Status (N6295) (None) Spoken Language Comprehension Discharge Status [phone]) (None) Spoken Language Expression Current Status [phone]) (None) Spoken Language Expression Goal Status (U2725) (None) Spoken Language Expression Discharge Status [phone]) (None) Attention Current Status (I3474) (None) Attention Goal Status (Q5956) (None) Attention Discharge Status [phone]) (None) Memory Current Status (E3329) (None) Memory Goal Status (J1884) (None) Memory Discharge Status (Z6606) (None) Voice Current Status (T0160) (None) Voice Goal Status (F0932) (None) Voice Discharge Status (T5573) (None) Other Speech-Language Pathology Functional Limitation Current Status (U2025) (None) Other Speech-Language Pathology Functional Limitation Goal Status (K2706) (None) Other Speech-Language Pathology Functional Limitation Discharge Status [phone]) (None) Herbie Baltimore, MA CCC-SLP [phone] Lynann Beaver 12/06/2017, 2:32 PM              Dg C-arm 1-60 Min  Result Date: 12/04/2017 CLINICAL DATA:  Internal fixation of a intertrochanteric fracture. EXAM: OPERATIVE right HIP (WITH PELVIS IF  PERFORMED) 2 VIEWS TECHNIQUE: Fluoroscopic spot image(s) were submitted for interpretation post-operatively. COMPARISON:  Radiographs 12/02/2017 FINDINGS: There is an intramedullary gamma nail with a proximal dynamic hip screw and a distal interlocking screw transfixing the intertrochanteric fracture. Anatomic reduction without complicating features. IMPRESSION: Close reduction and internal fixation of a right hip intertrochanteric fracture with anatomic reduction. Electronically Signed   By: Marijo Sanes M.D.   On: 12/04/2017 19:28   Dg Hip Operative Unilat W Or W/o Pelvis Right  Result Date: 12/04/2017 CLINICAL DATA:  Internal fixation of a intertrochanteric fracture. EXAM: OPERATIVE right HIP (WITH PELVIS IF PERFORMED) 2 VIEWS TECHNIQUE: Fluoroscopic spot image(s) were submitted for interpretation post-operatively. COMPARISON:  Radiographs 12/02/2017 FINDINGS: There is an intramedullary gamma nail with a proximal dynamic hip screw and a distal interlocking screw transfixing the intertrochanteric fracture. Anatomic reduction without complicating features. IMPRESSION: Close reduction and internal fixation of a right hip intertrochanteric fracture with anatomic reduction. Electronically Signed   By: Marijo Sanes M.D.   On: 12/04/2017 19:28   Dg Hip Unilat  With Pelvis 2-3 Views Right  Result Date: 12/02/2017 CLINICAL DATA:  Patient reports a fall today.  Pain. EXAM: DG HIP (WITH OR WITHOUT PELVIS) 2-3V RIGHT COMPARISON:  01/24/2016 FINDINGS: Bones are diffusely demineralized. Comminuted intertrochanteric right femoral neck fracture with varus angulation evident. Lesser trochanter exists as a free fragment. Cross-table lateral films are nondiagnostic. SI joints and symphysis pubis unremarkable. Penile prosthetic device evident. IMPRESSION: Comminuted intertrochanteric right femoral neck fracture with varus angulation. Electronically Signed   By: Misty Stanley M.D.   On: 12/02/2017 19:09     Discharge  Exam: Vitals:   12/07/17 2113 12/08/17 0637  BP: (!) 147/65 (!) 149/66  Pulse: 94 79  Resp: 20 18  Temp: 99.6 F (37.6 C) 98.9 F (37.2 C)  SpO2: 97% 94%   Vitals:   12/07/17 1000 12/07/17 1500 12/07/17 2113 12/08/17 0637  BP:  (!) 156/65 (!) 147/65 (!) 149/66  Pulse:  89 94 79  Resp:  _0 Temp:  (!) 97.4 F (36.3 C) 99.6 F (37.6 C) 98.9 F (37.2 C)  TempSrc:  Oral Oral Oral  SpO2: 95% 93% 97% 94%  Weight:      Height:        General: NAD  Cardiovascular: RRR, N8/G9 +systolic murmur  Respiratory: Air entry improved, mild crackles bibasilar  Abdominal: Soft, NT, ND, bowel sounds + Extremities: trace B/L LE edema    The results of significant diagnostics from this hospitalization (including imaging, microbiology, ancillary and laboratory) are listed below for reference.     Microbiology: Recent Results (from the past 240 hour(s))  Surgical PCR screen     Status: Abnormal   Collection Time: 12/03/17  5:25 PM  Result Value Ref Range Status   MRSA, PCR POSITIVE (A) NEGATIVE Final   Staphylococcus aureus POSITIVE (A) NEGATIVE Final    Comment: RESULT CALLED TO, READ BACK BY AND VERIFIED WITH: A STEVENS,RN AT 1938 12/03/17 BY L BENFIELD (NOTE) The Xpert SA Assay (FDA approved for NASAL specimens in patients 15 years of age and older), is one component of a comprehensive surveillance program. It is not intended to diagnose infection nor to guide or monitor treatment.      Labs: BNP (last 3 results) Recent Labs    12/02/17 1823  BNP 562.1*   Basic Metabolic Panel: Recent Labs  Lab 12/04/17 0903 12/05/17 0737 12/06/17 0713 12/07/17 0534 12/08/17 0555  NA 137 138 136 140 141  K 4.0 5.0 4.7 4.2 4.2  CL 108 106 107 110 110  CO2 23 23 19* 23 21*  GLUCOSE 101* 272* 253* 208* 177*  BUN 32* 40* 58* 53* 47*  CREATININE 2.00* 2.23* 2.79* 2.38* 2.15*  CALCIUM 8.5* 8.5* 8.0* 8.1* 8.3*  MG 2.2 2.2 2.2  --   --   PHOS 2.8 4.6 3.9  --   --    Liver Function  Tests: Recent Labs  Lab 12/04/17 0903 12/05/17 0737 12/06/17 0713  AST _0 ALT 12* 14* 13*  ALKPHOS 29* 29* 30*  BILITOT 0.8 1.3* 0.8  PROT 6.3* 6.5 5.7*  ALBUMIN 2.9* 2.9* 2.5*   No results for input(s): LIPASE, AMYLASE in the last 168 hours. No results for input(s): AMMONIA in the last 168 hours. CBC: Recent Labs  Lab 12/02/17 1823  12/04/17 0903 12/05/17 0737 12/06/17 0713 12/07/17 0534 12/08/17 0555  WBC 10.9*   < > 9.4 10.2 10.8* 7.9 7.9  NEUTROABS 7.7  --  6.0 7.7 7.8*  --  4.7  HGB 15.3   < > 12.4* 12.2* 10.5* 10.3* 9.4*  HCT 46.5   < > 38.1* 37.1* 31.6* 31.5* 30.1*  MCV 91.4   < > 92.0 92.5 94.3 94.0 93.8  PLT 171   < > 122* 129* 131* 138* 165   < > = values in this interval not displayed.   Cardiac Enzymes: No results for input(s): CKTOTAL, CKMB, CKMBINDEX, TROPONINI in the last 168 hours. BNP: Invalid input(s): POCBNP CBG: Recent Labs  Lab 12/07/17 1741 12/07/17 1819 12/07/17 2118 12/08/17 0626 12/08/17 1202  GLUCAP 380* 355* 323* 168* 292*   D-Dimer No results for input(s): DDIMER in the last 72 hours. Hgb A1c No results for input(s): HGBA1C in the last 72 hours. Lipid Profile No results for input(s): CHOL, HDL, LDLCALC, TRIG, CHOLHDL, LDLDIRECT in the last 72 hours. Thyroid function studies No results for input(s): TSH, T4TOTAL, T3FREE, THYROIDAB in the last 72 hours.  Invalid input(s): FREET3 Anemia work up No results for input(s): VITAMINB12, FOLATE, FERRITIN, TIBC, IRON, RETICCTPCT in the last 72 hours. Urinalysis    Component Value Date/Time   COLORURINE YELLOW 12/03/2017 1713   APPEARANCEUR CLEAR 12/03/2017 1713   LABSPEC 1.021 12/03/2017 1713   PHURINE 5.0  12/03/2017 1713   GLUCOSEU >=500 (A) 12/03/2017 1713   HGBUR SMALL (A) 12/03/2017 1713   BILIRUBINUR NEGATIVE 12/03/2017 1713   BILIRUBINUR neg 03/17/2017 1038   KETONESUR NEGATIVE 12/03/2017 1713   PROTEINUR 30 (A) 12/03/2017 1713   UROBILINOGEN 0.2 03/17/2017 1038    UROBILINOGEN 0.2 03/10/2015 1733   NITRITE NEGATIVE 12/03/2017 1713   LEUKOCYTESUR NEGATIVE 12/03/2017 1713   Sepsis Labs Invalid input(s): PROCALCITONIN,  WBC,  LACTICIDVEN Microbiology Recent Results (from the past 240 hour(s))  Surgical PCR screen     Status: Abnormal   Collection Time: 12/03/17  5:25 PM  Result Value Ref Range Status   MRSA, PCR POSITIVE (A) NEGATIVE Final   Staphylococcus aureus POSITIVE (A) NEGATIVE Final    Comment: RESULT CALLED TO, READ BACK BY AND VERIFIED WITH: A STEVENS,RN AT 1938 12/03/17 BY L BENFIELD (NOTE) The Xpert SA Assay (FDA approved for NASAL specimens in patients 3 years of age and older), is one component of a comprehensive surveillance program. It is not intended to diagnose infection nor to guide or monitor treatment.      Time coordinating discharge: 35 minutes  SIGNED:  Chipper Oman, MD  [facility] 12/08/2017, 2:05 PM  Pager please text page via  [email or web address]

## 2017-12-08 NOTE — Social Work (Addendum)
Clinical Social Worker facilitated patient discharge including contacting patient family and facility to confirm patient discharge plans.  Clinical information faxed to facility and family agreeable with plan.    CSW arranged ambulance transport via [facility] to [facility].    RN to call [phone] to give report prior to discharge. Pt going to Room 108.  Clinical Social Worker will sign off for now as social work intervention is no longer needed. Please consult [place] again if new need arises.  Elissa Hefty, LCSW Clinical Social Worker [phone]

## 2017-12-08 NOTE — Progress Notes (Addendum)
Physical Therapy Treatment Patient Details Name: Frank Simpson MRN: [id number] DOB: [birth year] Today's Date: 12/08/2017    History of Present Illness Frank Simpson is a 84 y.o. male with medical history significant of HTN, MI, CAD s/p DES to LAD, Diastolic dysfunction last EF 55-60% with grade 2 dysfunction, CHB s/p PPM, DM type II with peripheral neuropathy, Basal cell skin cancer s/p removal, syncope, and recurrent falls with previous SDH in 10/2016; who presents after having a mechanical fall at home reporting excruciating right leg pain. R femur fracture, now s/p R IM Nail, TWB RLE    PT Comments    Therapist returned second session to assist nursing staff with transferring pt from recliner.  Pt able to assist with bilat UE and required max A +2 and max cues to roll side to side for placement of lift pad. Current plan remains appropriate.    Follow Up Recommendations  SNF     Equipment Recommendations  Other (comment)(TBD next venue)    Recommendations for Other Services       Precautions / Restrictions Precautions Precautions: Fall Restrictions Weight Bearing Restrictions: Yes RLE Weight Bearing: Touchdown weight bearing    Mobility  Bed Mobility Overal bed mobility: Needs Assistance Bed Mobility: Rolling Rolling: Max assist;+2 for physical assistance   Supine to sit: +2 for physical assistance;Max assist     General bed mobility comments: assist to roll side to side for positioning on transport stretcher  Transfers Overall transfer level: Needs assistance   Transfers: Anterior-Posterior Transfer       Anterior-Posterior transfers: Total assist;+2 physical assistance;+2 safety/equipment   General transfer comment: pt OOB in chair upon arrival; +2 assist to roll side to side with recliner laying flat and use of bed pad to roll hips for placement of lift pad; pt able to assist with bilat UE to pull; increased time to follow commands; nursing staff assisted  with transfer from recliner to transport stretcher   Ambulation/Gait                 Stairs            Wheelchair Mobility    Modified Rankin (Stroke Patients Only)       Balance Overall balance assessment: Needs assistance Sitting-balance support: Bilateral upper extremity supported Sitting balance-Leahy Scale: Poor Sitting balance - Comments: Pt requires MaxA to maintain upright sitting position without LE support on floor  Postural control: Posterior lean                                  Cognition Arousal/Alertness: Awake/alert Behavior During Therapy: Flat affect Overall Cognitive Status: Impaired/Different from baseline Area of Impairment: Attention;Following commands;Awareness;Problem solving                   Current Attention Level: Sustained   Following Commands: Follows one step commands with increased time   Awareness: Intellectual Problem Solving: Slow processing;Decreased initiation;Difficulty sequencing;Requires verbal cues;Requires tactile cues General Comments: Pt continues to demonstrate decreased initiation when completing ADLs and functional tasks, requires one step commands, increased time, repetition, and multimodal cues       Exercises      General Comments        Pertinent Vitals/Pain Pain Assessment: Faces Faces Pain Scale: Hurts little more Pain Location: R hip  Pain Descriptors / Indicators: Grimacing;Guarding Pain Intervention(s): Monitored during session;Repositioned    Home Living  Prior Function            PT Goals (current goals can now be found in the care plan section) Acute Rehab PT Goals Patient Stated Goal: to go home  Progress towards PT goals: Progressing toward goals    Frequency    Min 3X/week      PT Plan Current plan remains appropriate    Co-evaluation PT/OT/SLP Co-Evaluation/Treatment: Yes Reason for Co-Treatment: For patient/therapist  safety;To address functional/ADL transfers PT goals addressed during session: Mobility/safety with mobility;Balance;Strengthening/ROM        AM-PAC PT "6 Clicks" Daily Activity  Outcome Measure  Difficulty turning over in bed (including adjusting bedclothes, sheets and blankets)?: Unable Difficulty moving from lying on back to sitting on the side of the bed? : Unable Difficulty sitting down on and standing up from a chair with arms (e.g., wheelchair, bedside commode, etc,.)?: Unable Help needed moving to and from a bed to chair (including a wheelchair)?: Total Help needed walking in hospital room?: Total Help needed climbing 3-5 steps with a railing? : Total 6 Click Score: 6    End of Session Equipment Utilized During Treatment: Gait belt Activity Tolerance: Patient tolerated treatment well Patient left: with call bell/phone within reach;in bed;with nursing/sitter in room;with family/visitor present Nurse Communication: Mobility status PT Visit Diagnosis: Other abnormalities of gait and mobility (R26.89);Repeated falls (R29.6);Muscle weakness (generalized) (M62.81);History of falling (Z91.81);Pain Pain - Right/Left: Right Pain - part of body: Hip     Time: 1450-1507 PT Time Calculation (min) (ACUTE ONLY): 17 min  Charges:  $Therapeutic Activity: 8-22 mins                    G Codes:       Frank Simpson, PTA Pager: [phone]     Frank Simpson 12/08/2017, 3:28 PM

## 2017-12-08 NOTE — Plan of Care (Signed)
  Clinical Measurements: Will remain free from infection 12/08/2017 0418 - Progressing by Anson Fret, RN Note Aspiration precautions in place; POC reviewed with pt.

## 2017-12-08 NOTE — Progress Notes (Signed)
Physical Therapy Treatment Patient Details Name: Frank Simpson MRN: [id number] DOB: 09-Mar-1931 Today's Date: 12/08/2017    History of Present Illness Frank Simpson is a 84 y.o. male with medical history significant of HTN, MI, CAD s/p DES to LAD, Diastolic dysfunction last EF 55-60% with grade 2 dysfunction, CHB s/p PPM, DM type II with peripheral neuropathy, Basal cell skin cancer s/p removal, syncope, and recurrent falls with previous SDH in 10/2016; who presents after having a mechanical fall at home reporting excruciating right leg pain. R femur fracture, now s/p R IM Nail, TWB RLE    PT Comments    Patient required +3 assist for transfer bed to recliner. Continue to progress as tolerated with anticipated d/c to SNF for further skilled PT services.    Follow Up Recommendations  SNF     Equipment Recommendations  Other (comment)(TBD next venue)    Recommendations for Other Services       Precautions / Restrictions Precautions Precautions: Fall Restrictions Weight Bearing Restrictions: Yes RLE Weight Bearing: Touchdown weight bearing    Mobility  Bed Mobility Overal bed mobility: Needs Assistance Bed Mobility: Supine to Sit     Supine to sit: +2 for physical assistance;Max assist     General bed mobility comments: assist to bring L LE toward EOB, scoot hips; cues for sequencing and hand placement once in sitting; max A to maintain anterior lean of trunk   Transfers Overall transfer level: Needs assistance Equipment used: 2 person hand held assist Transfers: Comptroller transfers: Total assist;+2 physical assistance;+2 safety/equipment   General transfer comment: Pt required +3 assist to complete AP transfer; cues for hand placement, anterior translation of trunk, and sequencing; assist of 2 at hips to faciliatate forward posterior and to scoot hips with bed pad and 3rd person to assist with bilat LE  Ambulation/Gait                  Stairs            Wheelchair Mobility    Modified Rankin (Stroke Patients Only)       Balance Overall balance assessment: Needs assistance Sitting-balance support: Bilateral upper extremity supported Sitting balance-Leahy Scale: Poor Sitting balance - Comments: Pt requires MaxA to maintain upright sitting position without LE support on floor  Postural control: Posterior lean                                  Cognition Arousal/Alertness: Awake/alert Behavior During Therapy: Flat affect Overall Cognitive Status: Impaired/Different from baseline Area of Impairment: Attention;Following commands;Awareness;Problem solving                   Current Attention Level: Sustained   Following Commands: Follows one step commands with increased time   Awareness: Intellectual Problem Solving: Slow processing;Decreased initiation;Difficulty sequencing;Requires verbal cues;Requires tactile cues General Comments: Pt continues to demonstrate decreased initiation when completing ADLs and functional tasks, requires one step commands, increased time, repetition, and multimodal cues       Exercises      General Comments        Pertinent Vitals/Pain Pain Assessment: Faces Faces Pain Scale: Hurts little more Pain Location: R hip  Pain Descriptors / Indicators: Grimacing;Guarding;Moaning Pain Intervention(s): Limited activity within patient's tolerance;Monitored during session;Repositioned    Home Living  Prior Function            PT Goals (current goals can now be found in the care plan section) Acute Rehab PT Goals Patient Stated Goal: to go home  Progress towards PT goals: Progressing toward goals    Frequency    Min 3X/week      PT Plan Current plan remains appropriate    Co-evaluation PT/OT/SLP Co-Evaluation/Treatment: Yes Reason for Co-Treatment: For patient/therapist safety;To address  functional/ADL transfers PT goals addressed during session: Mobility/safety with mobility;Balance;Strengthening/ROM        AM-PAC PT "6 Clicks" Daily Activity  Outcome Measure  Difficulty turning over in bed (including adjusting bedclothes, sheets and blankets)?: Unable Difficulty moving from lying on back to sitting on the side of the bed? : Unable Difficulty sitting down on and standing up from a chair with arms (e.g., wheelchair, bedside commode, etc,.)?: Unable Help needed moving to and from a bed to chair (including a wheelchair)?: Total Help needed walking in hospital room?: Total Help needed climbing 3-5 steps with a railing? : Total 6 Click Score: 6    End of Session Equipment Utilized During Treatment: Gait belt Activity Tolerance: Patient tolerated treatment well Patient left: in chair;with call bell/phone within reach;with family/visitor present Nurse Communication: Mobility status PT Visit Diagnosis: Other abnormalities of gait and mobility (R26.89);Repeated falls (R29.6);Muscle weakness (generalized) (M62.81);History of falling (Z91.81);Pain Pain - Right/Left: Right Pain - part of body: Hip     Time: 9242-6834 PT Time Calculation (min) (ACUTE ONLY): 18 min  Charges:  $Therapeutic Activity: 8-22 mins                    G Codes:       Earney Navy, PTA Pager: [phone]     Darliss Cheney 12/08/2017, 1:33 PM

## 2017-12-08 NOTE — Progress Notes (Addendum)
Occupational Therapy Treatment Patient Details Name: Frank Simpson MRN: [id number] DOB: January 08, 1931 Today's Date: 12/08/2017    History of present illness Frank Simpson is a 84 y.o. male with medical history significant of HTN, MI, CAD s/p DES to LAD, Diastolic dysfunction last EF 55-60% with grade 2 dysfunction, CHB s/p PPM, DM type II with peripheral neuropathy, Basal cell skin cancer s/p removal, syncope, and recurrent falls with previous SDH in 10/2016; who presents after having a mechanical fall at home reporting excruciating right leg pain. R femur fracture, now s/p R IM Nail, TWB RLE   OT comments  Pt making slow progress towards goals. Pt transferred bed>recliner using A/P method this session with +3 assist. Pt continues to require increased time and multimodal cues for following one step commands. Completed simple grooming ADLs and UB dressing with MinGuard-MinA. Pt continues to demonstrate weakness and decreased activity tolerance. Feel SNF recommendation for post acute therapies remains appropriate at this time. Will continue to follow while in acute setting to progress Pt towards established goals.   Follow Up Recommendations  SNF;Supervision/Assistance - 24 hour    Equipment Recommendations  Other (comment)(defer to next venue )          Precautions / Restrictions Precautions Precautions: Fall Restrictions Weight Bearing Restrictions: Yes RLE Weight Bearing: Touchdown weight bearing       Mobility Bed Mobility Overal bed mobility: Needs Assistance Bed Mobility: Supine to Sit     Supine to sit: +2 for physical assistance;Max assist     General bed mobility comments: cues for advancing LLE towards EOB, assist to scoot hipe with use of bed pad and elevate trunk into sitting; Pt requires maxA to maintain sitting balance and multimodal cues to failicate forward lean   Transfers Overall transfer level: Needs assistance Equipment used: 2 person hand held  assist Transfers: Comptroller transfers: Total assist;+2 physical assistance;+2 safety/equipment   General transfer comment: +3 assist to complete A/P transfer to recliner with one person assist with LEs and 2 person assisting to advance hips with use of bed pad; Pt requires MaxA assist to maintain upright sitting position; verbal and tactile cues to have Pt use UEs to assist with transfer as well as to wt shift forward as Pt with strong posterior lean though Pt with limited ability to assist (suspect partly due to fatigue/weakness/cognition)     Balance Overall balance assessment: Needs assistance Sitting-balance support: Bilateral upper extremity supported Sitting balance-Leahy Scale: Poor Sitting balance - Comments: Pt requires MaxA to maintain upright sitting position without LE support on floor  Postural control: Posterior lean                                 ADL either performed or assessed with clinical judgement   ADL Overall ADL's : Needs assistance/impaired     Grooming: Min guard;Wash/dry face;Sitting Grooming Details (indicate cue type and reason): Pt provided setup assist and supervision for washing face, suppported sitting in recliner  Upper Body Bathing: Minimal assistance;Sitting Upper Body Bathing Details (indicate cue type and reason): doffing/donning new gown; Pt requires multimodal cues to use RUE to assist in threading sleeve off/onto LUE          Lower Body Dressing: Total assistance;Bed level Lower Body Dressing Details (indicate cue type and reason): total assist for sock management  Functional mobility during ADLs: Total assistance;+2 for physical assistance;+2 for safety/equipment General ADL Comments: Pt completed A/P transfer with +3 assist this session; completed grooming and UB dressing ADLs once seated in recliner; Pt continues to require multimodal cues and increased time for  following directions and task completion                        Cognition Arousal/Alertness: Awake/alert Behavior During Therapy: Flat affect Overall Cognitive Status: Impaired/Different from baseline Area of Impairment: Attention;Following commands;Awareness;Problem solving                   Current Attention Level: Sustained   Following Commands: Follows one step commands with increased time   Awareness: Intellectual Problem Solving: Slow processing;Decreased initiation;Difficulty sequencing;Requires verbal cues;Requires tactile cues General Comments: Pt continues to demonstrate decreased initiation when completing ADLs and functional tasks, requires one step commands, increased time, repetition, and multimodal cues                           Pertinent Vitals/ Pain       Pain Assessment: Faces Faces Pain Scale: Hurts little more Pain Location: R hip  Pain Descriptors / Indicators: Grimacing;Guarding;Moaning Pain Intervention(s): Limited activity within patient's tolerance;Monitored during session;Repositioned                                                          Frequency  Min 2X/week        Progress Toward Goals  OT Goals(current goals can now be found in the care plan section)  Progress towards OT goals: Progressing toward goals  Acute Rehab OT Goals Patient Stated Goal: to go home  OT Goal Formulation: With patient Time For Goal Achievement: 12/19/17 Potential to Achieve Goals: [place] Discharge plan remains appropriate                     AM-PAC PT "6 Clicks" Daily Activity     Outcome Measure   Help from another person eating meals?: A Little Help from another person taking care of personal grooming?: A Little Help from another person toileting, which includes using toliet, bedpan, or urinal?: Total Help from another person bathing (including washing, rinsing, drying)?: A Lot Help from another  person to put on and taking off regular upper body clothing?: A Lot Help from another person to put on and taking off regular lower body clothing?: Total 6 Click Score: 12    End of Session    OT Visit Diagnosis: Muscle weakness (generalized) (M62.81);History of falling (Z91.81);Unsteadiness on feet (R26.81)   Activity Tolerance Patient tolerated treatment well   Patient Left in chair;with call bell/phone within reach;with family/visitor present;with chair alarm set   Nurse Communication Mobility status        Time: 0017-4944 OT Time Calculation (min): 29 min  Charges: OT General Charges $OT Visit: 1 Visit OT Treatments $Self Care/Home Management : 8-22 mins  Lou Cal, OT Pager [phone] 12/08/2017    Frank Simpson 12/08/2017, 11:33 AM

## 2017-12-10 ENCOUNTER — Emergency Department (HOSPITAL_COMMUNITY): Payer: Medicare Other

## 2017-12-10 ENCOUNTER — Emergency Department (HOSPITAL_COMMUNITY)
Admission: EM | Admit: 2017-12-10 | Discharge: 2017-12-10 | Disposition: A | Discharge status: Home / Self Care | Payer: Medicare Other | Attending: Emergency Medicine | Admitting: Emergency Medicine

## 2017-12-10 ENCOUNTER — Encounter (HOSPITAL_COMMUNITY): Payer: Self-pay | Admitting: Emergency Medicine

## 2017-12-10 DIAGNOSIS — Y999 Unspecified external cause status: Secondary | ICD-10-CM | POA: Insufficient documentation

## 2017-12-10 DIAGNOSIS — S0990XA Unspecified injury of head, initial encounter: Secondary | ICD-10-CM | POA: Diagnosis not present

## 2017-12-10 DIAGNOSIS — Y93F1 Activity, caregiving, bathing: Secondary | ICD-10-CM | POA: Insufficient documentation

## 2017-12-10 DIAGNOSIS — S0181XA Laceration without foreign body of other part of head, initial encounter: Secondary | ICD-10-CM | POA: Insufficient documentation

## 2017-12-10 DIAGNOSIS — N183 Chronic kidney disease, stage 3 (moderate): Secondary | ICD-10-CM | POA: Insufficient documentation

## 2017-12-10 DIAGNOSIS — Z7982 Long term (current) use of aspirin: Secondary | ICD-10-CM | POA: Insufficient documentation

## 2017-12-10 DIAGNOSIS — I5032 Chronic diastolic (congestive) heart failure: Secondary | ICD-10-CM | POA: Diagnosis not present

## 2017-12-10 DIAGNOSIS — S0003XA Contusion of scalp, initial encounter: Secondary | ICD-10-CM | POA: Diagnosis not present

## 2017-12-10 DIAGNOSIS — Y92199 Unspecified place in other specified residential institution as the place of occurrence of the external cause: Secondary | ICD-10-CM | POA: Insufficient documentation

## 2017-12-10 DIAGNOSIS — S6991XA Unspecified injury of right wrist, hand and finger(s), initial encounter: Secondary | ICD-10-CM

## 2017-12-10 DIAGNOSIS — Z794 Long term (current) use of insulin: Secondary | ICD-10-CM | POA: Insufficient documentation

## 2017-12-10 DIAGNOSIS — I130 Hypertensive heart and chronic kidney disease with heart failure and stage 1 through stage 4 chronic kidney disease, or unspecified chronic kidney disease: Secondary | ICD-10-CM | POA: Diagnosis not present

## 2017-12-10 DIAGNOSIS — R29810 Facial weakness: Secondary | ICD-10-CM

## 2017-12-10 DIAGNOSIS — W19XXXA Unspecified fall, initial encounter: Secondary | ICD-10-CM

## 2017-12-10 DIAGNOSIS — S299XXA Unspecified injury of thorax, initial encounter: Secondary | ICD-10-CM | POA: Diagnosis not present

## 2017-12-10 DIAGNOSIS — S199XXA Unspecified injury of neck, initial encounter: Secondary | ICD-10-CM | POA: Diagnosis not present

## 2017-12-10 DIAGNOSIS — S62616A Displaced fracture of proximal phalanx of right little finger, initial encounter for closed fracture: Secondary | ICD-10-CM | POA: Diagnosis not present

## 2017-12-10 DIAGNOSIS — R269 Unspecified abnormalities of gait and mobility: Secondary | ICD-10-CM

## 2017-12-10 DIAGNOSIS — Z79899 Other long term (current) drug therapy: Secondary | ICD-10-CM | POA: Insufficient documentation

## 2017-12-10 DIAGNOSIS — W06XXXA Fall from bed, initial encounter: Secondary | ICD-10-CM | POA: Insufficient documentation

## 2017-12-10 LAB — COMPREHENSIVE METABOLIC PANEL
ALBUMIN: 2.8 g/dL — AB (ref 3.5–5.0)
ALT: 24 U/L (ref 17–63)
AST: 41 U/L (ref 15–41)
Alkaline Phosphatase: 33 U/L — ABNORMAL LOW (ref 38–126)
Anion gap: 11 (ref 5–15)
BILIRUBIN TOTAL: 1.4 mg/dL — AB (ref 0.3–1.2)
BUN: 31 mg/dL — AB (ref 6–20)
CHLORIDE: 108 mmol/L (ref 101–111)
CO2: 22 mmol/L (ref 22–32)
CREATININE: 1.62 mg/dL — AB (ref 0.61–1.24)
Calcium: 8.5 mg/dL — ABNORMAL LOW (ref 8.9–10.3)
GFR calc Af Amer: 43 mL/min — ABNORMAL LOW (ref >60)
GFR, EST NON AFRICAN AMERICAN: 37 mL/min — AB (ref >60)
GLUCOSE: 175 mg/dL — AB (ref 65–99)
POTASSIUM: 3.8 mmol/L (ref 3.5–5.1)
Sodium: 141 mmol/L (ref 135–145)
Total Protein: 6.7 g/dL (ref 6.5–8.1)

## 2017-12-10 LAB — URINALYSIS, ROUTINE W REFLEX MICROSCOPIC
Bacteria, UA: NONE SEEN
Bilirubin Urine: NEGATIVE
GLUCOSE, UA: 150 mg/dL — AB
KETONES UR: NEGATIVE mg/dL
Leukocytes, UA: NEGATIVE
Nitrite: NEGATIVE
PH: 5 (ref 5.0–8.0)
Protein, ur: 30 mg/dL — AB
SPECIFIC GRAVITY, URINE: 1.017 (ref 1.005–1.030)
SQUAMOUS EPITHELIAL / LPF: NONE SEEN

## 2017-12-10 LAB — I-STAT CHEM 8, ED
BUN: 31 mg/dL — ABNORMAL HIGH (ref 6–20)
CALCIUM ION: 1.14 mmol/L — AB (ref 1.15–1.40)
Chloride: 106 mmol/L (ref 101–111)
Creatinine, Ser: 1.5 mg/dL — ABNORMAL HIGH (ref 0.61–1.24)
Glucose, Bld: 171 mg/dL — ABNORMAL HIGH (ref 65–99)
HCT: 34 % — ABNORMAL LOW (ref 39.0–52.0)
HEMOGLOBIN: 11.6 g/dL — AB (ref 13.0–17.0)
Potassium: 3.8 mmol/L (ref 3.5–5.1)
SODIUM: 143 mmol/L (ref 135–145)
TCO2: 24 mmol/L (ref 22–32)

## 2017-12-10 LAB — DIFFERENTIAL
BASOS PCT: 0 %
Basophils Absolute: 0 10*3/uL (ref 0.0–0.1)
EOS ABS: 0.4 10*3/uL (ref 0.0–0.7)
Eosinophils Relative: 3 %
LYMPHS ABS: 2.7 10*3/uL (ref 0.7–4.0)
Lymphocytes Relative: 26 %
MONOS PCT: 8 %
Monocytes Absolute: 0.9 10*3/uL (ref 0.1–1.0)
NEUTROS ABS: 6.6 10*3/uL (ref 1.7–7.7)
NEUTROS PCT: 63 %

## 2017-12-10 LAB — PROTIME-INR
INR: 1.15
PROTHROMBIN TIME: 14.6 s (ref 11.4–15.2)

## 2017-12-10 LAB — CBC
HCT: 34.7 % — ABNORMAL LOW (ref 39.0–52.0)
Hemoglobin: 11.6 g/dL — ABNORMAL LOW (ref 13.0–17.0)
MCH: 30.6 pg (ref 26.0–34.0)
MCHC: 33.4 g/dL (ref 30.0–36.0)
MCV: 91.6 fL (ref 78.0–100.0)
PLATELETS: 263 10*3/uL (ref 150–400)
RBC: 3.79 MIL/uL — AB (ref 4.22–5.81)
RDW: 13.3 % (ref 11.5–15.5)
WBC: 10.6 10*3/uL — ABNORMAL HIGH (ref 4.0–10.5)

## 2017-12-10 LAB — RAPID URINE DRUG SCREEN, HOSP PERFORMED
Amphetamines: NOT DETECTED
BARBITURATES: NOT DETECTED
BENZODIAZEPINES: NOT DETECTED
COCAINE: NOT DETECTED
Opiates: POSITIVE — AB
TETRAHYDROCANNABINOL: NOT DETECTED

## 2017-12-10 LAB — I-STAT TROPONIN, ED: TROPONIN I, POC: 0.04 ng/mL (ref 0.00–0.08)

## 2017-12-10 LAB — APTT: aPTT: 26 seconds (ref 24–36)

## 2017-12-10 LAB — ETHANOL

## 2017-12-10 MED ORDER — LIDOCAINE HCL 2 % IJ SOLN
10.0000 mL | Freq: Once | INTRAMUSCULAR | Status: AC
  Administered 2017-12-10: 200 mg
  Filled 2017-12-10: qty 20

## 2017-12-10 MED ORDER — HYDROCODONE-ACETAMINOPHEN 5-325 MG PO TABS
1.0000 | ORAL_TABLET | Freq: Once | ORAL | Status: AC
  Administered 2017-12-10: 1 via ORAL
  Filled 2017-12-10: qty 1

## 2017-12-10 NOTE — ED Notes (Signed)
Pt repositioned and dressed and placed in a gown. Washed blood from his face and did mouth care Pt given dinner ([place] sandwich with crackers pb and coffee and sprite).

## 2017-12-10 NOTE — Consult Note (Signed)
NEUROHOSPITALISTS - CONSULTATION NOTE   Requesting Physician: Dr. Dene Gentry  Admit date: 12/10/2017    Chief Complaint: Left facial droop  History obtained from:  Patient, family, Nursing at SNF and Chart Review  HPI   Frank Simpson is an 84 y.o. male with a PMH of HTN, HLD, CAD, CHF, Hx of CVA in 2013 - Left internal capsule - followed by Dr Roney Jaffe, who was at [facility] recovering from a Right hip surgery. This morning at approximately 11:20 AM, Per nursing reports patient accidentally fell out of bed during a bath and sustained a Left forehead laceration and hematoma while reaching for a towel.  Upon admission to the ED a Left facial droop was noted and a code stroke was called. Patient was examined by Dr Leonel Ramsay and STAT Head CT was obtained. CT negative for acute findings. Upon repeat examination, chart review and further interview of family members at bedside, patient has a left facial droop at baseline along with dysarthria. At this time no need for further stroke work-up.   Date last known well: Date: 12/10/2017 Time last known well: Time: 11:20 tPA Given: No: Low NIHSS, CT Head negative, rapidly resolving symptoms and baseline findings from old stroke on exam. Modified Rankin: Rankin Score=4 NIHSS: 0  Past Medical History: He  has a past medical history of Acute cystitis (01/29/2016), BCC (basal cell carcinoma of skin) (2015), Benign positional vertigo, Blurred vision, CAD (coronary artery disease), CHF (congestive heart failure) ([place]), Complication of anesthesia, CRI (chronic renal insufficiency), CVA (cerebral infarction) (09/2012), Diabetes mellitus type 2 with retinopathy ([place]) (1994), Dysplastic nevus (2015), Glaucoma, History of melanoma, Hyperlipidemia, Hypertension, Hypertensive retinopathy (2017), Ischemic heart disease, Melanoma in situ of neck ([place]) (05/2017), Osteoarthritis, Pharyngeal dysphagia (03/17/2017), Post herpetic neuralgia,  Shingles (in March 2014), Squamous cell skin cancer (2016), Syncope (04/02/2013), Thrombocytopenia ([place]), and Urinary incontinence.  Past Surgical History: He  has a past surgical history that includes Replacement total knee (04/2010); Tonsillectomy; Cardiovascular stress test (04/27/2010); Foot surgery; right shoulder; Finger surgery; lexiscan myoview (10/2011); Cataract extraction (12/12, 1/13); Coronary stent placement (07/2012); Cardiac catheterization (10/15/2012); carotid US (03/2013); US ECHOCARDIOGRAPHY (03/2013); Penile prosthesis implant; left heart catheterization with coronary angiogram (N/A, 08/06/2012); left heart catheterization with coronary angiogram (N/A, 10/15/2012); permanent pacemaker insertion (N/A, 10/06/2014); carotid US (02/2015); and Intramedullary (im) nail intertrochanteric (Right, 12/04/2017).  Family History:  indicated that his mother is deceased. He indicated that his father is deceased. He indicated that both of his sisters are alive. He indicated that his brother is alive.  Social History:  He  reports that  has never smoked. he has never used smokeless tobacco. He reports that he does not drink alcohol or use drugs.  Allergies:  Allergies  Allergen Reactions  . Metformin And Related Other (See Comments)    Affected kidneys  . Oxycodone Other (See Comments)    Severe confusion  . Januvia [Sitagliptin Phosphate] Other (See Comments)    Possibly affected kidneys?  . Codeine   . Penicillins Other (See Comments)    Reaction long time ago - doesn't remember   Medications: No current facility-administered medications for this encounter.   Current Outpatient Medications:  .  acetaminophen (TYLENOL) 500 MG tablet, Take 1 tablet (500 mg total) by mouth every 6 (six) hours as needed for mild pain., Disp: 30 tablet, Rfl: 0 .  amLODipine (NORVASC) 5 MG tablet, Take 1 tablet (5 mg total) by mouth daily., Disp: 90 tablet, Rfl: 2 .  aspirin EC 325  MG tablet, Take 1 tablet (325 mg  total) by mouth daily., Disp: 30 tablet, Rfl: 0 .  fluticasone (FLONASE) 50 MCG/ACT nasal spray, Place 1 spray into both nostrils 2 (two) times daily., Disp: , Rfl: 2 .  HYDROcodone-acetaminophen (NORCO/VICODIN) 5-325 MG tablet, Take 1-2 tablets by mouth every 6 (six) hours as needed for moderate pain., Disp: 15 tablet, Rfl: 0 .  Insulin Glargine (BASAGLAR KWIKPEN) 100 UNIT/ML SOPN, Inject 0.35 mLs (35 Units total) into the skin 2 (two) times daily., Disp: 15 pen, Rfl: 3 .  insulin lispro (HUMALOG KWIKPEN) 100 UNIT/ML KiwkPen, INJECT 3 UNITS  TID with meals, Disp: 30 mL, Rfl: 3 .  isosorbide mononitrate (IMDUR) 30 MG 24 hr tablet, Take 1 tablet (30 mg total) by mouth daily., Disp: 90 tablet, Rfl: 3 .  Naphazoline HCl (CLEAR EYES OP), Place 1 drop into both eyes 3 (three) times daily., Disp: , Rfl:  .  rosuvastatin (CRESTOR) 20 MG tablet, Take 1 tablet (20 mg total) by mouth daily. (Patient taking differently: Take 20 mg by mouth at bedtime. ), Disp: 90 tablet, Rfl: 0 .  sertraline (ZOLOFT) 25 MG tablet, Take 1 tablet (25 mg total) by mouth daily., Disp: 90 tablet, Rfl: 3 .  tamsulosin (FLOMAX) 0.4 MG CAPS capsule, Take 1 capsule (0.4 mg total) by mouth daily., Disp: 90 capsule, Rfl: 3  ROS: History obtained from the patient General ROS: negative for - chills, fatigue, fever, night sweats, weight gain or weight loss Psychological ROS: negative for - behavioral disorder, hallucinations, memory difficulties, mood swings or suicidal ideation Ophthalmic ROS: negative for - blurry vision, double vision, eye pain or loss of vision ENT ROS: negative for - epistaxis, nasal discharge, oral lesions, sore throat, tinnitus or vertigo Allergy and Immunology ROS: negative for - hives or itchy/watery eyes Hematological and Lymphatic ROS: negative for - bleeding problems, bruising or swollen lymph nodes Endocrine ROS: negative for - galactorrhea, hair pattern changes, polydipsia/polyuria or temperature  intolerance Respiratory ROS: negative for - cough, hemoptysis, shortness of breath or wheezing Cardiovascular ROS: negative for - chest pain, dyspnea on exertion, edema or irregular heartbeat Gastrointestinal ROS: negative for - abdominal pain, diarrhea, hematemesis, nausea/vomiting or stool incontinence Genito-Urinary ROS: negative for - dysuria, hematuria, incontinence or urinary frequency/urgency Musculoskeletal ROS: positve for - Right leg pain S/P Right hip surgery Neurological ROS: as noted in HPI Dermatological ROS: positive for Left forehead laceration  Physical Examination: Vitals:   12/10/17 1153 12/10/17 1207 12/10/17 1245  BP: 134/82  (!) 146/81  Pulse: 80  80  Resp: 18  20  Temp: 97.7 F (36.5 C)    TempSrc: Oral    SpO2: 94%  94%  Weight:  106.6 kg (235 lb)   Height:  6' (1.829 m)    HEENT-  Normocephalic, Normal external eye/conjunctiva.  Normal external ears. Normal external nose, mucus membranes and septum.   Cardiovascular - Regular rate and rhythm  Respiratory - Lungs clear bilaterally. Non-labored breathing, No wheezing. Abdomen - soft and non-tender, BS normal Extremities- no edema or cyanosis Skin-warm and dry. Laceration on Left side of forehead  Neurological Examination Mental Status: Alert, oriented, thought content appropriate.  Speech is slightly dysarthric. Per family at bedside this is a baseline finding, No neglect.  Able to follow 3 step commands without difficulty. Baseline dementia per family and Neurology notes Cranial Nerves: II: Visual Fields are full. Pupils are equal, round, and reactive to light.   III,IV, VI: EOMI without ptosis or diploplia. No  nystagmus.   V: Facial sensation is symmetric to light touch VII: Facial - right facial droop - per review of notes from 2016 hospital visit appears to be a baseline finding VIII: hearing is intact to voice X: Uvula elevates symmetrically XI: Shoulder shrug is symmetric. XII: tongue is midline  without atrophy or fasciculations.  Motor: Tone is normal. Bulk is normal. 5/5 strength was present in LUE, LLE, RUE. Unable to test RLE due to recent Hip surgery Sensor: Sensation is symmetric to light touch  in the arms and legs. Deep Tendon Reflexes: 2+ and symmetric throughout in the biceps and patellae Plantars: Toes are downgoing bilaterally.  Cerebellar: normal finger-to-nose,  Gait: not tested   Lab Results: CBC: Recent Labs  Lab 12/07/17 0534 12/08/17 0555 12/10/17 1202 12/10/17 1213  WBC 7.9 7.9 10.6*  --   HGB 10.3* 9.4* 11.6* 11.6*  HCT 31.5* 30.1* 34.7* 34.0*  MCV 94.0 93.8 91.6  --   PLT 138* 165 263  --    Basic Metabolic Panel: Recent Labs  Lab 12/04/17 0903 12/05/17 0737 12/06/17 0713 12/07/17 0534 12/08/17 0555 12/10/17 1213  NA 137 138 136 140 141 143  K 4.0 5.0 4.7 4.2 4.2 3.8  CL 108 106 107 110 110 106  CO2 23 23 19* 23 21*  --   GLUCOSE 101* 272* 253* 208* 177* 171*  BUN 32* 40* 58* 53* 47* 31*  CREATININE 2.00* 2.23* 2.79* 2.38* 2.15* 1.50*  CALCIUM 8.5* 8.5* 8.0* 8.1* 8.3*  --   MG 2.2 2.2 2.2  --   --   --   PHOS 2.8 4.6 3.9  --   --   --    Liver Function Tests: Recent Labs  Lab 12/04/17 0903 12/05/17 0737 12/06/17 0713  AST _0 ALT 12* 14* 13*  ALKPHOS 29* 29* 30*  BILITOT 0.8 1.3* 0.8  PROT 6.3* 6.5 5.7*  ALBUMIN 2.9* 2.9* 2.5*   Coagulation Studies: Recent Labs    12/10/17 1202  APTT 26  INR 1.15   Urinalysis:  Recent Labs  Lab 12/03/17 1713  COLORURINE YELLOW  APPEARANCEUR CLEAR  LABSPEC 1.021  PHURINE 5.0  GLUCOSEU >=500*  HGBUR SMALL*  BILIRUBINUR NEGATIVE  KETONESUR NEGATIVE  PROTEINUR 30*  NITRITE NEGATIVE  LEUKOCYTESUR NEGATIVE   Imaging: Ct Cervical Spine Wo Contrast Result Date: 12/10/2017 IMPRESSION: 1. Stable degenerative changes of the cervical spine. 2. No acute fracture or traumatic subluxation. 3.  Aortic Atherosclerosis (ICD10-I70.0).  Ct Head Code Stroke Wo Contrast Result Date:  12/10/2017 IMPRESSION: 1. Stable moderate atrophy and white matter disease without acute intracranial abnormality. 2. Left supraorbital scalp soft tissue swelling and hematoma without underlying fracture. 3. ASPECTS is 10/10     IMPRESSION: Frank Simpson is a 84 y.o. male with PMH of HTN, HLD, CAD, CHF, Hx of CVA in 2013 - Left internal capsule - followed by Dr Roney Jaffe, presents to ED with  Left forehead laceration/hematoma after accidentally falling out of bed. Upon admission to the ED a Left facial droop was noted and a code stroke was called.   STAT Head CT negative for acute findings. Patient has a left facial droop at baseline along with dysarthria from prior stroke in 2013. At this time no need for further stroke work-up.   STROKE Ruled out:  Resultant Symptoms: Left facial droop is a baseline finding Stroke Risk Factors: diabetes mellitus, hyperlipidemia and hypertension Other Stroke Risk Factors: Advanced age, Obesity, Body mass index is  31.87 kg/m. , Hx stroke, CHF, CAD  PLAN  12/10/2017: Continue Aspirin/ Statin Ongoing aggressive stroke risk factor management Follow up with [facility], Dr Krista Blue in 4 weeks  HX OF STROKES: 2013 Anterior limb Left internal capsule - Likely silent stroke  FAMILY UPDATES: family at bedside  TEAM UPDATES:[place], Wallis Bamberg, MD  Follow Up:  Follow-up Information    Marcial Pacas, MD. Schedule an appointment as soon as possible for a visit in 4 week(s).   Specialty:  Neurology Contact information: [place] [address] [phone]          [name], MD -PCP Follow up in 1-2 weeks      Assessment and plan discussed with with attending physician and they are in agreement.    Neurology to sign off at this time. Please call if any further questions or concerns. Thank you for this consultation.  Veer Elamin Sella, ANP-C [facility] 12/10/2017, 1:00 PM

## 2017-12-10 NOTE — ED Triage Notes (Signed)
Per EMS:   Pt was being seen at [facility] for surgery on his R hip 6 days ago. Pt was getting bathed in bed this AM and at 1120 Pt rolled out of bed and struck his head on the A/C unit. LKW was 1120. Facial droop noticed on arrival at [facility]. MD made aware.

## 2017-12-10 NOTE — ED Notes (Signed)
Report attempted @ [place]. Phone continued to ring and ring.  No answer.

## 2017-12-10 NOTE — Discharge Instructions (Addendum)
Your wound has been closed with a medical-grade glue called Dermabond. Please adhere to the following wound care instructions:  You may shower, but avoid submerging the wound. Do not scrub the wound, as this may cause the glue to wear off prematurely.    The glue will wear off on its own, usually within 5-10 days. During this time period DO NOT apply antibiotic ointments or any other ointments or lotions to the area as this can cause the glue to wear off prematurely.  After 10 days, you may apply ointments, such as Neosporin, to the area to help the remaining glue to wear off.   After the wound has healed and the glue is gone, you may apply ointments such as Aquaphor to the wound to reduce scarring.  Keep right finger splinted at all times, change the coban twice a day. Follow up with Dr. Griffin Basil or a hand surgeon of choice next week  May use ibuprofen, naproxen, or Tylenol for pain, if you are able to take these medications.  Return to the ED should the wound edges come apart or signs of infection arise, such as spreading redness, puffiness/swelling, pus draining from the wound, severe increase in pain, or any other major issues.  Hand injury Should you still have difficulty with moving the right little finger, please follow-up with the hand specialist.  Head Injury You have been seen today for a head injury. It does not appear to be serious at this time.  Close observation: The close observation period is usually 6 hours from the injury. This includes staying awake and having a trustworthy adult monitor you to assure your condition does not worsen. You should be in regular contact with this person and ideally, they should be able to monitor you in person.  Secondary observation: The secondary observation period is usually 24 hours from the injury. You are allowed to sleep during this time. A trustworthy adult should intermittently monitor you to assure your condition does not worsen.    Overall head injury/concussion care: Rest: Be sure to get plenty of rest. You will need more rest and sleep while you recover. Hydration: Be sure to stay well hydrated by having a goal of drinking about 0.5 liters of water an hour. Pain: Ibuprofen, naproxen, or Tylenol for pain.  Return to sports and activities: In general, you may return to normal activities once symptoms have subsided, however, you would ideally be cleared by a primary care provider or other qualified medical professional prior to return to these activities.  Follow up: Follow up with the concussion clinic or your primary care provider for further management of this issue. Return: Return to the ED should you begin to have confusion, abnormal behavior, aggression, violence, or personality changes, repeated vomiting, vision loss, numbness or weakness on one side of the body, difficulty standing due to dizziness, significantly worsening pain, or any other major concerns.  Fall and weakness Please follow up with the neurologist on these matters within the next 4 weeks.

## 2017-12-10 NOTE — ED Provider Notes (Signed)
Patient signed out to me at shift change.  Patient from [facility] rehab facility after recent right hip fracture, here after a fall at the facility.  Patient sustained several injuries including laceration to the head, which was repaired by a previous provider.  It was noted prior to discharge the patient's right fifth finger was hurting as well and had an obvious deformity.  X-ray was ordered and is pending.  5:25 PM Patient's x-ray showed transverse, angulated, mildly displaced fracture of proximal phalanx of fifth finger.  I discussed patient with hand surgeon on call, who recommended reduction, splinting, following up in the office.  Archie Endo Block Date/Time: 12/11/2017 12:07 AM Performed by: Jeannett Senior, PA-C Authorized by: Jeannett Senior, PA-C   Consent:    Consent obtained:  Verbal   Consent given by:  Patient   Risks discussed:  Allergic reaction, infection, nerve damage and bleeding   Alternatives discussed:  No treatment Indications:    Indications:  Procedural anesthesia Location:    Body area:  Upper extremity   Upper extremity nerve:  Metacarpal   Laterality:  Right Pre-procedure details:    Skin preparation:  Povidone-iodine Procedure details (see MAR for exact dosages):    Block needle gauge:  25 G   Anesthetic injected:  Lidocaine 2% w/o epi   Steroid injected:  None   Injection procedure:  Anatomic landmarks identified   Paresthesia:  Immediately resolved Post-procedure details:    Outcome:  Anesthesia achieved   Patient tolerance of procedure:  Tolerated well, no immediate complications Reduction of fracture Date/Time: 12/11/2017 12:09 AM Performed by: Jeannett Senior, PA-C Authorized by: Jeannett Senior, PA-C  Consent: Verbal consent obtained. Risks and benefits: risks, benefits and alternatives were discussed Consent given by: patient Patient understanding: patient states understanding of the procedure being performed Patient consent: the  patient's understanding of the procedure matches consent given Patient identity confirmed: verbally with patient Time out: Immediately prior to procedure a "time out" was called to verify the correct patient, procedure, equipment, support staff and site/side marked as required. Preparation: Patient was prepped and draped in the usual sterile fashion. Local anesthesia used: yes Anesthesia: digital block  Anesthesia: Local anesthesia used: yes Patient tolerance: Patient tolerated the procedure well with no immediate complications Comments: Right 5th finger fracture reduced using gentle traction. Finger splint applied.     Finger reduced, splinted, appears anatomically more straight.  Will discharge home back to facility.  Follow-up with hand.  Vitals:   12/10/17 1645 12/10/17 1730 12/10/17 1741 12/10/17 1749  BP:  131/74    Pulse: 73 81 79 76  Resp: 19 (!) 23 (!) 23 (!) 24  Temp:      TempSrc:      SpO2: 96% 91% 95% 95%  Weight:      Height:          Jeannett Senior, PA-C 12/11/17 Ronald Pippins, MD 12/11/17 [phone]
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
## 2017-12-11 ENCOUNTER — Other Ambulatory Visit: Payer: Self-pay

## 2017-12-12 ENCOUNTER — Encounter: Payer: Self-pay | Admitting: Internal Medicine

## 2017-12-12 ENCOUNTER — Non-Acute Institutional Stay (SKILLED_NURSING_FACILITY): Payer: Medicare Other | Admitting: Internal Medicine

## 2017-12-12 DIAGNOSIS — S72141A Displaced intertrochanteric fracture of right femur, initial encounter for closed fracture: Secondary | ICD-10-CM | POA: Diagnosis not present

## 2017-12-12 DIAGNOSIS — IMO0002 Reserved for concepts with insufficient information to code with codable children: Secondary | ICD-10-CM

## 2017-12-12 DIAGNOSIS — E1143 Type 2 diabetes mellitus with diabetic autonomic (poly)neuropathy: Secondary | ICD-10-CM

## 2017-12-12 DIAGNOSIS — E1165 Type 2 diabetes mellitus with hyperglycemia: Secondary | ICD-10-CM

## 2017-12-12 DIAGNOSIS — R296 Repeated falls: Secondary | ICD-10-CM

## 2017-12-12 DIAGNOSIS — F0390 Unspecified dementia without behavioral disturbance: Secondary | ICD-10-CM

## 2017-12-12 DIAGNOSIS — Z794 Long term (current) use of insulin: Secondary | ICD-10-CM

## 2017-12-12 DIAGNOSIS — F0150 Vascular dementia without behavioral disturbance: Secondary | ICD-10-CM | POA: Insufficient documentation

## 2017-12-12 MED ORDER — HYDROCODONE-ACETAMINOPHEN 5-325 MG PO TABS: 1.0000 | ORAL_TABLET | Freq: Three times a day (TID) | ORAL | 0 refills | Status: DC | PRN

## 2017-12-12 NOTE — Assessment & Plan Note (Signed)
Avoid agents on the [place] list

## 2017-12-12 NOTE — Assessment & Plan Note (Signed)
Monitor postural blood pressures once ambulatory again

## 2017-12-12 NOTE — Patient Instructions (Signed)
See assessment and plan under each diagnosis in the problem list and acutely for this visit

## 2017-12-12 NOTE — Assessment & Plan Note (Signed)
PT/OT at [facility]  Orthopedic follow-up as scheduled

## 2017-12-12 NOTE — Assessment & Plan Note (Signed)
Because of persistently elevated evening meal and  bedtime glucoses, increase Humalog 6 u prior to the evening meal

## 2017-12-12 NOTE — Progress Notes (Signed)
NURSING HOME LOCATION:  [facility] ROOM NUMBER:  [id number]  CODE STATUS:  Full Code  PCP:  [name], MD  [place] [id number]   This is a comprehensive admission note to [facility] performed on this date less than 30 days from date of admission. Included are preadmission medical/surgical history;reconciled medication list; family history; social history and comprehensive review of systems.  Corrections and additions to the records were documented . Comprehensive physical exam was also performed. Additionally a clinical summary was entered for each active diagnosis pertinent to this admission in the Problem List to enhance continuity of care.  HPI: The patient was hospitalized 12/02/17-12/08/17 for comminuted intertrochanteric femoral neck fracture sustained 1/5 in a mechanical fall at home. Apparently the patient had tripped over his walker which he employs to ambulate. Apparently there was no cardiac or neurologic prodrome prior to the fall. Dr. Sharol Given performed intertrochanteric intramedullary nailing on 12/04/17. Hospitalization was complicated by an episode of choking 12/05/17 with hypoxia, requiring high flow oxygen. Chest x-ray revealed no acute process Empirically he received Rocephin and Flagyl antibiotics. Speech therapy was consulted and swallowing evaluation completed. There were no acute findings. Speech therapy recommended heart healthy, carb modified, dysphagia 1 diet  In April 2018 laryngeal penetration and aspiration with thin liquids had been documented on swallowing study. He exhibited acute on chronic kidney disease, stage IV. Baseline creatinine was felt to be 2.00; at discharge it was 2.15. Glucoses labile during the stay & insulin was increased to 35 units twice a day. Humalog was continued at 3 units 3 times a day before meals A1c had been 7.6% on 11/01/17. He exhibited a normocytic anemia related to acute blood loss. Hemoglobin  remained stable postop. He was transferred to the SNF for PT/OT. Antibiotic therapy was to be continued as Augmentin for an additional 8 days. He return to the ED 1/13 having fallen off the bed while seated and striking his head on the wall ACE unit. He sustained a laceration to the central forehead . ED staff noted facial droop. Wife apparently described asymmetric smile as chronic issue. Laceration was closed. Imaging for pain in the right fifth finger revealed a transverse, angulated, mildly displaced fracture of the proximal phalanx. Reduction, splinting, with orthopedic follow-up was recommended. Here at that SNF glucoses remain labile. FBS have been in the 230 range for the most part. The highest glucoses are 380-390 after lunch and at bedtime.  Past medical and surgical history: Includes chronic diastolic heart failure, essential hypertension, coronary artery disease status post CABG and DES, complete heart block status post permanent pacemaker, history of melanoma in situ, basal cell skin cancer, and dysplastic nevus and history of subdural hemorrhage.  Social history: Nondrinker, patient never smoked  Family history: Reviewed  Review of systems:Could not be completed due to dementia. He was unable to give me the date and could tell me no details of his recent falls. He does complain of throbbing pain in the right lower extremity. He denies active cardiopulmonary, GU or GI symptoms.  Physical exam:  Pertinent or positive findings: Hair is disheveled, pattern alopecia is present. Clinically he is lethargic. His responses are slow and slightly slurred. He has bilateral ptosis, greater on the right. He has anisocoria, the left pupil is larger than the right. He has intermittent esotropia of the left eye. The right nasolabial fold is decreased. He has a slow S4 type gallop. There are low-grade rhonchi greater on the  left than the right. Upper extremities are weak but symmetrically so. The left  lower extremity is also weak to opposition. The right lower extremity was not tested. The right fifth finger is splinted and wrapped. He has a new laceration above the left eye which is healing without signs of cellulitis or purulence. There is an old scar of the left forehead.   General appearance:Adequately nourished; no acute distress , increased work of breathing is present.   Lymphatic: No lymphadenopathy about the head, neck, axilla . Eyes: No conjunctival inflammation or lid edema is present. There is no scleral icterus. Ears:  External ear exam shows no significant lesions or deformities.   Nose:  External nasal examination shows no deformity or inflammation. Nasal mucosa are pink and moist without lesions ,exudates Oral exam: lips and gums are healthy appearing.There is no oropharyngeal erythema or exudate . Neck:  No thyromegaly, masses, tenderness noted.    Heart:  No murmur, click, rub .  Lungs:without wheezes,rales , rubs. Abdomen:Bowel sounds are normal. Abdomen is soft and nontender with no organomegaly, hernias,masses. GU: deferred  Extremities:  No cyanosis, clubbing,edema  Neurologic exam : Balance,Rhomberg,finger to nose testing could not be completed due to clinical state Skin: Warm & dry w/o tenting. No significant lesions or rash.  See clinical summary under each active problem in the Problem List with associated updated therapeutic plan

## 2017-12-15 DIAGNOSIS — S62606A Fracture of unspecified phalanx of right little finger, initial encounter for closed fracture: Secondary | ICD-10-CM | POA: Diagnosis not present

## 2017-12-15 LAB — BASIC METABOLIC PANEL
BUN: 24 — AB (ref 4–21)
Creatinine: 1.4 — AB (ref 0.6–1.3)
Glucose: 67
Potassium: 4 (ref 3.4–5.3)
Sodium: 141 (ref 137–147)

## 2017-12-15 LAB — CBC AND DIFFERENTIAL
HEMATOCRIT: 32 — AB (ref 41–53)
HEMOGLOBIN: 11.4 — AB (ref 13.5–17.5)
NEUTROS ABS: 7
PLATELETS: 275 (ref 150–399)
WBC: 10.1

## 2017-12-18 ENCOUNTER — Non-Acute Institutional Stay (SKILLED_NURSING_FACILITY): Payer: Medicare Other | Admitting: Adult Health

## 2017-12-18 ENCOUNTER — Encounter: Payer: Self-pay | Admitting: Adult Health

## 2017-12-18 DIAGNOSIS — S62616D Displaced fracture of proximal phalanx of right little finger, subsequent encounter for fracture with routine healing: Secondary | ICD-10-CM

## 2017-12-18 DIAGNOSIS — N183 Chronic kidney disease, stage 3 unspecified: Secondary | ICD-10-CM

## 2017-12-18 DIAGNOSIS — S72141A Displaced intertrochanteric fracture of right femur, initial encounter for closed fracture: Secondary | ICD-10-CM

## 2017-12-18 DIAGNOSIS — K5901 Slow transit constipation: Secondary | ICD-10-CM | POA: Diagnosis not present

## 2017-12-18 NOTE — Progress Notes (Signed)
Location:  [facility] Room Number: 112-A Place of Service:  SNF (31) Provider:  Durenda Age, NP  Patient Care Team: [name], MD as PCP - General (Family Medicine) Burnell Blanks, MD as PCP - Cardiology (Cardiology) [name], MD (Family Medicine) Marshall Cork, OD as Referring Physician (Ophthalmology) Burnell Blanks, MD as Consulting Physician (Cardiology) Harriett Sine, MD as Consulting Physician (Dermatology) Inocencio Homes, [place] as Consulting Physician (Podiatry) Kathie Rhodes, MD as Consulting Physician (Urology) Henreitta Leber, DDS as Referring Physician (Dentistry)  Extended Emergency Contact Information Primary Emergency Contact: [facility] Address: [address] [place], [place] [address] [name] of [place] Phone: [phone] Mobile Phone: [phone] Relation: Spouse Secondary Emergency Contact: Fohl,Roger  [place] Phone: [phone] Relation: Son  Code Status:  Full Code  Goals of care: Advanced Directive information Advanced Directives 12/10/2017  Does Patient Have a Medical Advance Directive? No  Would patient like information on creating a medical advance directive? -  Pre-existing out of facility DNR order (yellow form or pink MOST form) -     Chief Complaint  Patient presents with  . Acute Visit    Patient seen for followup, status post fall in SNF with forehead injury    HPI:  Pt is an 84 y.o. male seen today for an acute visit for followup.  He fell at the SNF and hit his head and dislocated his right 5th finger necessitating an ED visit. Noted to have  Right 5th finger splinted together with the 4th right finger. Before being sent to ED for the fall, he was having rehabilitation @ [facility]  Sustaining right hip fracture which was surgically repaired. He has a PMH of multiple falls, acute respiratory  failure with hypoxia, AKI on CKD stage IV, DM2, chronic diastolic CHF, CAD s/p CABG and DES, complete heart block s/p PPM, and history of subdural hemorrhage. He was seen in the room with wife at bedside. He complained of constipation.     Past Medical History:  Diagnosis Date  . Acute cystitis 01/29/2016  . BCC (basal cell carcinoma of skin) 2015   L neck (Dr. Sherrye Payor), L forearm [facility])  . Benign positional vertigo   . Blurred vision   . CAD (coronary artery disease)    07/2012 acute STEMI, mid LAD PCI - DES; cath 09/2012 patent LAD stent, non-hemodynamically significant Left Main/LAD disease, EF 55%  . CHF (congestive heart failure) ([place])    declined [facility] CM services  . Complication of anesthesia    confused after cath 10/15/2012  . CRI (chronic renal insufficiency)    baseline Cr seems to be 1.7-1.8  . CVA (cerebral infarction) 09/2012   remote anterior limb of left internal capsule  . Diabetes mellitus type 2 with retinopathy ([place]) 1994   DMSE 2012  . Dysplastic nevus 2015   L upper back, Lat margin involved ([facility])  . Glaucoma    and cataracts  . History of melanoma   . Hyperlipidemia   . Hypertension   . Hypertensive retinopathy 2017   retinal flame hemorrhages R eye Kathlen Mody)  . Ischemic heart disease   . Melanoma in situ of neck ([place]) 05/2017   lentigo maligna type L neck ([facility])  . Osteoarthritis   . Pharyngeal dysphagia 03/17/2017   MBS 02/2017 - laryngeal penetration and aspiration with thin liquids. rec throat clear after every swallow of liquid. rec  outpt ST - pt declined this.  . Post herpetic neuralgia   . Shingles in March 2014   right chest, across the back  . Squamous cell skin cancer 2016   multiple sites - mandible, temple, wrist, forearm ([place])  . Syncope 04/02/2013  . Thrombocytopenia ([place])   . Urinary incontinence    s/p PTNS didn't help   Past Surgical History:  Procedure Laterality Date  . CARDIAC CATHETERIZATION  10/15/2012  .  CARDIOVASCULAR STRESS TEST  04/27/2010   EF 75%, nuclear stress test with normal perfusion, no ischemia  . carotid US  03/2013   WNL  . carotid US  02/2015   1-39% B carotid stenosis  . CATARACT EXTRACTION  12/12, 1/13   bilateral  . CORONARY STENT PLACEMENT  07/2012   DES to mid LAD for STEMI  . FINGER SURGERY     amputated finger  . FOOT SURGERY     metal pin in place  . INTRAMEDULLARY (IM) NAIL INTERTROCHANTERIC Right 12/04/2017   Procedure: INTRAMEDULLARY (IM) NAIL INTERTROCHANTRIC;  Surgeon: Newt Minion, MD;  Location: [facility];  Service: Orthopedics;  Laterality: Right;  . LEFT HEART CATHETERIZATION WITH CORONARY ANGIOGRAM N/A 08/06/2012   Procedure: LEFT HEART CATHETERIZATION WITH CORONARY ANGIOGRAM;  Surgeon: Burnell Blanks, MD;  Location: [facility] CATH LAB;  Service: Cardiovascular;  Laterality: N/A;  . LEFT HEART CATHETERIZATION WITH CORONARY ANGIOGRAM N/A 10/15/2012   Procedure: LEFT HEART CATHETERIZATION WITH CORONARY ANGIOGRAM;  Surgeon: Peter M Martinique, MD;  Location: [facility] CATH LAB;  Service: Cardiovascular;  Laterality: N/A;  . lexiscan myoview  10/2011   negative for ischemia  . PENILE PROSTHESIS IMPLANT    . PERMANENT PACEMAKER INSERTION N/A 10/06/2014   Procedure: PERMANENT PACEMAKER INSERTION;  Surgeon: Evans Lance, MD;  Location: [facility] CATH LAB;  Service: Cardiovascular;  Laterality: N/A;  . REPLACEMENT TOTAL KNEE  04/2010   RIGHT KNEE  . right shoulder    . TONSILLECTOMY    . US ECHOCARDIOGRAPHY  03/2013   inf/septal hypokinesis, mild LVH, EF 45%, LA mildly dilated    Allergies  Allergen Reactions  . Metformin And Related Other (See Comments)    Affected kidneys  . Oxycodone Other (See Comments)    Severe confusion  . Januvia [Sitagliptin Phosphate] Other (See Comments)    Possibly affected kidneys?  . Codeine   . Penicillins Other (See Comments)    Reaction long time ago - doesn't remember    Outpatient Encounter Medications as of 12/18/2017  Medication Sig  .  acetaminophen (TYLENOL) 500 MG tablet Take 1 tablet (500 mg total) by mouth every 6 (six) hours as needed for mild pain.  [name] Kitchen amLODipine (NORVASC) 5 MG tablet Take 1 tablet (5 mg total) by mouth daily.  [name] Kitchen aspirin EC 325 MG tablet Take 1 tablet (325 mg total) by mouth daily.  . fluticasone (FLONASE) 50 MCG/ACT nasal spray Place 1 spray into both nostrils 2 (two) times daily.  [name] Kitchen HYDROcodone-acetaminophen (NORCO/VICODIN) 5-325 MG tablet Take 1 tablet by mouth every 6 (six) hours as needed for moderate pain. #42 doses  . Insulin Glargine (BASAGLAR KWIKPEN) 100 UNIT/ML SOPN Inject 0.35 mLs (35 Units total) into the skin 2 (two) times daily.  . insulin lispro (HUMALOG KWIKPEN) 100 UNIT/ML KiwkPen INJECT 3 UNITS  TID with meals  . isosorbide mononitrate (IMDUR) 30 MG 24 hr tablet Take 1 tablet (30 mg total) by mouth daily.  . Naphazoline HCl (CLEAR EYES OP) Place 1 drop into both eyes  3 (three) times daily.  [name] Kitchen NUTRITIONAL SUPPLEMENT LIQD Take 120 mLs by mouth daily. MedPass  . rosuvastatin (CRESTOR) 20 MG tablet Take 1 tablet (20 mg total) by mouth daily.  . sertraline (ZOLOFT) 25 MG tablet Take 1 tablet (25 mg total) by mouth daily.  . tamsulosin (FLOMAX) 0.4 MG CAPS capsule Take 1 capsule (0.4 mg total) by mouth daily.   No facility-administered encounter medications on file as of 12/18/2017.     Review of Systems  GENERAL: No change in appetite, no fatigue, no weight changes, no fever, chills or weakness MOUTH and THROAT: Denies oral discomfort, gingival pain or bleeding, pain from teeth or hoarseness   RESPIRATORY: no cough, SOB, DOE, wheezing, hemoptysis CARDIAC: No chest pain, edema or palpitations GI: +constipation GU: Denies dysuria, frequency, hematuria, incontinence, or discharge PSYCHIATRIC: Denies feelings of depression or anxiety. No report of hallucinations, insomnia, paranoia, or agitation   Immunization History  Administered Date(s) Administered  . Influenza Whole 09/11/2013  .  Influenza, Seasonal, Injecte, Preservative Fre 08/28/2014  . Influenza,inj,Quad PF,6+ Mos 09/14/2015, 08/26/2016, 09/14/2017  . Pneumococcal Conjugate-13 07/09/2014  . Pneumococcal Polysaccharide-23 11/28/2008  . Tdap 10/03/2014, 02/16/2016  . Zoster 11/28/2013   Pertinent  Health Maintenance Due  Topic Date Due  . FOOT EXAM  03/07/2018  . OPHTHALMOLOGY EXAM  03/28/2018  . HEMOGLOBIN A1C  05/02/2018  . URINE MICROALBUMIN  11/01/2018  . INFLUENZA VACCINE  Completed  . PNA vac Low Risk Adult  Completed   Fall Risk  11/14/2017 05/09/2016 05/09/2016 07/09/2014  Falls in the past year? Yes Yes Yes Yes  Comment per spouse, pt falls at least once monthly - has multiple falls; cause unknown -  Number falls in past yr: 2 or more 2 or more 2 or more 2 or more  Injury with Fall? Yes Yes Yes -  Risk Factor Category  High Fall Risk High Fall Risk High Fall Risk High Fall Risk  Risk for fall due to : History of fall(s);Impaired balance/gait;Impaired mobility;Impaired vision;Mental status change Impaired balance/gait;Impaired mobility;History of fall(s) History of fall(s);Impaired balance/gait;Impaired mobility Impaired balance/gait  Follow up - Falls evaluation completed;Education provided Falls evaluation completed;Education provided -   Functional Status Survey:    Vitals:   12/18/17 1035  BP: 139/73  Pulse: 72  Resp: 18  Temp: 98.5 F (36.9 C)  TempSrc: Oral  SpO2: 92%  Weight: 237 lb (107.5 kg)  Height: 6' (1.829 m)   Body mass index is 32.14 kg/m.  Physical Exam  GENERAL APPEARANCE: Well nourished. In no acute distress. Obese SKIN:  Right hip surgical incision X 2 with 4 staples each,  covered with mepilex, dry, no erythema MOUTH and THROAT: Lips are without lesions. Oral mucosa is moist and without lesions. Tongue is normal in shape, size, and color and without lesions RESPIRATORY: Breathing is even & unlabored, BS CTAB CARDIAC: RRR, no murmur,no extra heart sounds, no edema,  left chest pacemaker GI: Abdomen soft, normal BS, no masses, no tenderness EXTREMITIES:  Able to move X 4 extremities, Right hemiparesis PSYCHIATRIC: Alert to self and place, disoriented to time. Affect and behavior are appropriate   Labs reviewed: 12/16/17  glucose 67 calcium 8.2 creatinine 1.44 BUN 23.9 sodium 141 K4.0  eGFR 43.96 WBC 10.1 hemoglobin 11.4 hematocrit 32.0 MCV 89.3 platelets 275 Recent Labs    12/04/17 0903 12/05/17 0737 12/06/17 0713 12/07/17 0534 12/08/17 0555 12/10/17 1202 12/10/17 1213  NA 137 138 136 140 141 141 143  K 4.0 5.0 4.7  4.2 4.2 3.8 3.8  CL 108 106 107 110 110 108 106  CO2 23 23 19* 23 21* 22  --   GLUCOSE 101* 272* 253* 208* 177* 175* 171*  BUN 32* 40* 58* 53* 47* 31* 31*  CREATININE 2.00* 2.23* 2.79* 2.38* 2.15* 1.62* 1.50*  CALCIUM 8.5* 8.5* 8.0* 8.1* 8.3* 8.5*  --   MG 2.2 2.2 2.2  --   --   --   --   PHOS 2.8 4.6 3.9  --   --   --   --    Recent Labs    12/05/17 0737 12/06/17 0713 12/10/17 1202  AST 22 21 41  ALT 14* 13* 24  ALKPHOS 29* 30* 33*  BILITOT 1.3* 0.8 1.4*  PROT 6.5 5.7* 6.7  ALBUMIN 2.9* 2.5* 2.8*   Recent Labs    12/06/17 0713 12/07/17 0534 12/08/17 0555 12/10/17 1202 12/10/17 1213  WBC 10.8* 7.9 7.9 10.6*  --   NEUTROABS 7.8*  --  4.7 6.6  --   HGB 10.5* 10.3* 9.4* 11.6* 11.6*  HCT 31.6* 31.5* 30.1* 34.7* 34.0*  MCV 94.3 94.0 93.8 91.6  --   PLT 131* 138* 165 263  --    Lab Results  Component Value Date   TSH 3.750 11/09/2016   Lab Results  Component Value Date   HGBA1C 7.6 (H) 11/01/2017   Lab Results  Component Value Date   CHOL 104 12/05/2017   HDL 34 (L) 12/05/2017   LDLCALC 47 12/05/2017   LDLDIRECT 55.0 12/30/2014   TRIG 116 12/05/2017   CHOLHDL 3.1 12/05/2017    Significant Diagnostic Results in last 30 days:  Dg Chest 1 View  Result Date: 12/02/2017 CLINICAL DATA:  Patient status post fall. EXAM: CHEST 1 VIEW COMPARISON:  Chest radiograph 01/13/2017. FINDINGS: Multi lead pacer apparatus  overlies the left hemithorax. Interval enlargement cardiac and mediastinal contours. Tortuosity of the thoracic aorta. Low lung volumes. Bibasilar atelectasis. No pleural effusion or pneumothorax. IMPRESSION: The cardiac and mediastinal contours appear enlarged when compared to recent prior exam which may be secondary to portable low volume technique. Pericardial effusion or other mediastinal process is not excluded. Recommend further evaluation with PA and lateral chest radiograph. Low lung volumes with basilar atelectasis. Electronically Signed   By: Lovey Newcomer M.D.   On: 12/02/2017 19:10   Dg Chest 2 View  Result Date: 12/10/2017 CLINICAL DATA:  [place] out of bed today. EXAM: CHEST  2 VIEW COMPARISON:  12/07/2017. FINDINGS: A poor inspiration is again demonstrated with no significant change in borderline enlargement of the cardiac silhouette. Clear lungs. Stable left subclavian bipolar pacemaker leads. Thoracic spine and right shoulder degenerative changes. No fracture or pneumothorax seen. IMPRESSION: No acute abnormality. Electronically Signed   By: Claudie Revering M.D.   On: 12/10/2017 13:52   Dg Chest 2 View  Result Date: 12/02/2017 CLINICAL DATA:  Fall. Previous portable study recommended repeat upright and lateral views for better evaluation of the patient. EXAM: CHEST  2 VIEW COMPARISON:  Portable chest 12/02/2017 FINDINGS: Cardiac pacemaker. Shallow inspiration. Cardiac enlargement. No pulmonary vascular congestion or edema. No focal consolidation. Calcified and tortuous aorta. Ribs mediastinal prominence seen previously is improved. This likely represented positional changes. No pneumothorax. No blunting of costophrenic angles. Calcification of the aorta. Degenerative changes in the spine. IMPRESSION: Shallow inspiration. Cardiac enlargement. No edema or consolidation. Aortic atherosclerosis. Electronically Signed   By: Lucienne Capers M.D.   On: 12/02/2017 22:29   Ct Head Wo  Contrast  Result  Date: 12/02/2017 CLINICAL DATA:  Per ed notes: Pt brought here by wife from home after tripping in yard while using walker EXAM: CT HEAD WITHOUT CONTRAST CT CERVICAL SPINE WITHOUT CONTRAST TECHNIQUE: Multidetector CT imaging of the head and cervical spine was performed following the standard protocol without intravenous contrast. Multiplanar CT image reconstructions of the cervical spine were also generated. COMPARISON:  11/22/2017 FINDINGS: CT HEAD FINDINGS Brain: There is mild central and cortical atrophy. Periventricular white matter changes are consistent with small vessel disease. There is no intra or extra-axial fluid collection or mass lesion. The basilar cisterns and ventricles have a normal appearance. There is no CT evidence for acute infarction or hemorrhage. Vascular: There is atherosclerotic calcification of the carotid siphons. Skull: Normal. Negative for fracture or focal lesion. Sinuses/Orbits: There is opacification of the left maxillary sinus. No sinus wall fracture imaged. Other: None CT CERVICAL SPINE FINDINGS Alignment: There is reversal of the normal cervical lordosis which may be secondary to splinting, positioning, or soft tissue injury. Skull base and vertebrae: No acute fracture. No primary bone lesion or focal pathologic process. Soft tissues and spinal canal: No prevertebral fluid or swelling. No visible canal hematoma. Disc levels:  Mild disc height loss primarily at C5-6 the C6-7. Upper chest: Remote left clavicle fracture. There is atherosclerotic calcification of the thoracic aorta. Left-sided pacemaker. Other: None IMPRESSION: 1.  No evidence for acute intracranial abnormality. 2. Atrophy and small vessel disease. 3. Opacification of the left maxillary sinus without imaged fracture. 4. Cervical spine degenerative changes without acute fracture. Electronically Signed   By: Nolon Nations M.D.   On: 12/02/2017 20:11   Ct Head W & Wo Contrast  Result Date: 11/22/2017 CLINICAL  DATA:  Frequent falls in the past year. Ortho static headaches. EXAM: CT HEAD WITHOUT AND WITH CONTRAST TECHNIQUE: Contiguous axial images were obtained from the base of the skull through the vertex without and with intravenous contrast CONTRAST:  36m ISOVUE-300 IOPAMIDOL (ISOVUE-300) INJECTION 61% COMPARISON:  05/18/2017 FINDINGS: Brain: No evidence of acute infarction, hemorrhage, hydrocephalus, extra-axial collection or mass lesion/mass effect. Chronic small vessel ischemia with confluent low-density in the periventricular white matter. Generalized cerebral volume loss that is stable. Vascular: No hyperdense vessel. Visible vessels are patent. Atherosclerotic calcification. Skull: No acute or aggressive finding Sinuses/Orbits: Negative where seen. IMPRESSION: No acute or reversible finding. No specific explanation for headaches. Electronically Signed   By: JMonte FantasiaM.D.   On: 11/22/2017 13:44   Ct Cervical Spine Wo Contrast  Result Date: 12/10/2017 CLINICAL DATA:  Acute onset of right-sided facial droop and a aphasia. Right hand weakness. Fall from bed with trauma to head. EXAM: CT CERVICAL SPINE WITHOUT CONTRAST TECHNIQUE: Multidetector CT imaging of the cervical spine was performed without intravenous contrast. Multiplanar CT image reconstructions were also generated. COMPARISON:  None. FINDINGS: Alignment: Grade 1 degenerative anterolisthesis C4-5 and C5-6 is stable. AP alignment is otherwise anatomic. Skull base and vertebrae: Craniocervical junction demonstrates advanced degenerative changes at C1-2. No acute fractures are present. Multilevel facet degenerative changes and uncovertebral changes are present. Left foraminal narrowing is most evident at C3-4. Soft tissues and spinal canal: The soft tissues demonstrate extensive atherosclerotic calcifications at the carotid bifurcations bilaterally. There is also a atherosclerotic calcification within the cavernous internal carotid arteries. The  central canal is grossly patent. Disc levels: Left foraminal narrowing is greatest at C3-4. No other focal stenosis is present. Upper chest: The lung apices are clear. No focal nodule, mass,  or airspace disease is present. IMPRESSION: 1. Stable degenerative changes of the cervical spine. 2. No acute fracture or traumatic subluxation. 3.  Aortic Atherosclerosis (ICD10-I70.0). Electronically Signed   By: San Morelle M.D.   On: 12/10/2017 12:47   Ct Cervical Spine Wo Contrast  Result Date: 12/02/2017 CLINICAL DATA:  Per ed notes: Pt brought here by wife from home after tripping in yard while using walker EXAM: CT HEAD WITHOUT CONTRAST CT CERVICAL SPINE WITHOUT CONTRAST TECHNIQUE: Multidetector CT imaging of the head and cervical spine was performed following the standard protocol without intravenous contrast. Multiplanar CT image reconstructions of the cervical spine were also generated. COMPARISON:  11/22/2017 FINDINGS: CT HEAD FINDINGS Brain: There is mild central and cortical atrophy. Periventricular white matter changes are consistent with small vessel disease. There is no intra or extra-axial fluid collection or mass lesion. The basilar cisterns and ventricles have a normal appearance. There is no CT evidence for acute infarction or hemorrhage. Vascular: There is atherosclerotic calcification of the carotid siphons. Skull: Normal. Negative for fracture or focal lesion. Sinuses/Orbits: There is opacification of the left maxillary sinus. No sinus wall fracture imaged. Other: None CT CERVICAL SPINE FINDINGS Alignment: There is reversal of the normal cervical lordosis which may be secondary to splinting, positioning, or soft tissue injury. Skull base and vertebrae: No acute fracture. No primary bone lesion or focal pathologic process. Soft tissues and spinal canal: No prevertebral fluid or swelling. No visible canal hematoma. Disc levels:  Mild disc height loss primarily at C5-6 the C6-7. Upper chest:  Remote left clavicle fracture. There is atherosclerotic calcification of the thoracic aorta. Left-sided pacemaker. Other: None IMPRESSION: 1.  No evidence for acute intracranial abnormality. 2. Atrophy and small vessel disease. 3. Opacification of the left maxillary sinus without imaged fracture. 4. Cervical spine degenerative changes without acute fracture. Electronically Signed   By: Nolon Nations M.D.   On: 12/02/2017 20:11   Dg Chest Port 1 View  Result Date: 12/07/2017 CLINICAL DATA:  Aspiration of food last evening.  Persistent cough. EXAM: PORTABLE CHEST 1 VIEW COMPARISON:  12/05/2017 FINDINGS: AP portable upright view of the chest. Low lung volumes with crowding of interstitial lung markings and bibasilar atelectasis. No overt pulmonary edema nor pneumonic consolidation. Aortic atherosclerosis at the arch without aneurysm. Left-sided pacemaker apparatus projects over the left costophrenic angle with right atrial and right ventricular leads in place. Osteoarthritis of the glenohumeral and AC joints bilaterally. No acute nor suspicious osseous abnormalities. IMPRESSION: Low lung volumes with borderline cardiomegaly, crowding of interstitial lung markings and bibasilar atelectasis. No pneumonic consolidations. Electronically Signed   By: Ashley Royalty M.D.   On: 12/07/2017 19:03   Dg Chest Port 1 View  Result Date: 12/05/2017 CLINICAL DATA:  Aspiration. EXAM: PORTABLE CHEST 1 VIEW COMPARISON:  12/02/2016 FINDINGS: Left chest wall pacer device is noted with lead in the right atrial appendage and right ventricle. Normal heart size. Aortic atherosclerosis. No pleural effusion or edema. No airspace opacities identified. IMPRESSION: 1. No airspace opacities identified to suggest aspiration or pneumonia. Electronically Signed   By: Kerby Moors M.D.   On: 12/05/2017 17:45   Dg Hand Complete Right  Result Date: 12/10/2017 CLINICAL DATA:  84 y/o  M; fall with pain. EXAM: RIGHT HAND - COMPLETE 3+ VIEW  COMPARISON:  None. FINDINGS: Transverse fracture of fifth proximal phalanx proximal diaphysis with radial and volar angulation and mild displacement. No joint dislocation. Moderate osteoarthrosis of the basal joint. Second distal interphalangeal joint effusion.  IMPRESSION: 1. Transverse angulated and mildly displaced fracture of fifth proximal phalanx proximal diaphysis. 2. Moderate basal joint osteoarthrosis. 3. Second distal interphalangeal joint effusion. Electronically Signed   By: Kristine Garbe M.D.   On: 12/10/2017 16:21   Dg Swallowing Func-speech Pathology  Result Date: 12/06/2017 Objective Swallowing Evaluation: Type of Study: MBS-Modified Barium Swallow Study  Patient Details Name: GUSTAV KNUEPPEL MRN: [id number] Date of Birth: Oct 05, 1931 Today's Date: 12/06/2017 Time: SLP Start Time (ACUTE ONLY): 1330 -SLP Stop Time (ACUTE ONLY): 1400 SLP Time Calculation (min) (ACUTE ONLY): 30 min Past Medical History: Past Medical History: Diagnosis Date . Acute cystitis 01/29/2016 . BCC (basal cell carcinoma of skin) 2015  L neck (Dr. Sherrye Payor), L forearm [facility] . Benign positional vertigo  . Blurred vision  . CAD (coronary artery disease)   07/2012 acute STEMI, mid LAD PCI - DES; cath 09/2012 patent LAD stent, non-hemodynamically significant Left Main/LAD disease, EF 55% . CHF (congestive heart failure) ([place])   declined [facility] CM services . Complication of anesthesia   confused after cath 10/15/2012 . CRI (chronic renal insufficiency)   baseline Cr seems to be 1.7-1.8 . CVA (cerebral infarction) 09/2012  remote anterior limb of left internal capsule . Diabetes mellitus type 2 with retinopathy ([place]) 1994  DMSE 2012 . Dysplastic nevus 2015  L upper back, Lat margin involved ([place]) . Glaucoma   and cataracts . History of melanoma  . Hyperlipidemia  . Hypertension  . Hypertensive retinopathy 2017  retinal flame hemorrhages R eye Kathlen Mody) . Ischemic heart disease  . Melanoma in situ of neck ([place]) 05/2017   lentigo maligna type L neck ([place]) . Osteoarthritis  . Pharyngeal dysphagia 03/17/2017  MBS 02/2017 - laryngeal penetration and aspiration with thin liquids. rec throat clear after every swallow of liquid. rec outpt ST - pt declined this. . Post herpetic neuralgia  . Shingles in March 2014  right chest, across the back . Squamous cell skin cancer 2016  multiple sites - mandible, temple, wrist, forearm ([place]) . Syncope 04/02/2013 . Thrombocytopenia ([place])  . Urinary incontinence   s/p PTNS didn't help Past Surgical History: Past Surgical History: Procedure Laterality Date . CARDIAC CATHETERIZATION  10/15/2012 . CARDIOVASCULAR STRESS TEST  04/27/2010  EF 75%, nuclear stress test with normal perfusion, no ischemia . carotid US  03/2013  WNL . carotid US  02/2015  1-39% B carotid stenosis . CATARACT EXTRACTION  12/12, 1/13  bilateral . CORONARY STENT PLACEMENT  07/2012  DES to mid LAD for STEMI . FINGER SURGERY    amputated finger . FOOT SURGERY    metal pin in place . INTRAMEDULLARY (IM) NAIL INTERTROCHANTERIC Right 12/04/2017  Procedure: INTRAMEDULLARY (IM) NAIL INTERTROCHANTRIC;  Surgeon: Newt Minion, MD;  Location: [place];  Service: Orthopedics;  Laterality: Right; . LEFT HEART CATHETERIZATION WITH CORONARY ANGIOGRAM N/A 08/06/2012  Procedure: LEFT HEART CATHETERIZATION WITH CORONARY ANGIOGRAM;  Surgeon: Burnell Blanks, MD;  Location: [facility] CATH LAB;  Service: Cardiovascular;  Laterality: N/A; . LEFT HEART CATHETERIZATION WITH CORONARY ANGIOGRAM N/A 10/15/2012  Procedure: LEFT HEART CATHETERIZATION WITH CORONARY ANGIOGRAM;  Surgeon: Peter M Martinique, MD;  Location: [facility] CATH LAB;  Service: Cardiovascular;  Laterality: N/A; . lexiscan myoview  10/2011  negative for ischemia . PENILE PROSTHESIS IMPLANT   . PERMANENT PACEMAKER INSERTION N/A 10/06/2014  Procedure: PERMANENT PACEMAKER INSERTION;  Surgeon: Evans Lance, MD;  Location: [facility] CATH LAB;  Service: Cardiovascular;  Laterality: N/A; . REPLACEMENT TOTAL KNEE   04/2010  RIGHT KNEE . right  shoulder   . TONSILLECTOMY   . US ECHOCARDIOGRAPHY  03/2013  inf/septal hypokinesis, mild LVH, EF 45%, LA mildly dilated HPI: Alma L Lomanis a 84 y.o.malewith medical history significant ofHTN,MI,CAD s/pDES to LAD, Diastolic dysfunction last EF 55-60% with grade 2 dysfunction,CHBs/p PPM,DM type II with peripheral neuropathy, Basal cell skin cancers/premoval, syncope, andrecurrent falls with previous SDHin 10/2016;who presents after having a mechanicalfallat home reportingexcruciating right leg pain. Patient notes that he tripped over his walker which he normally uses at baseline to ambulate. He has a long history of falls, and been noted to be progressively getting weaker. He states that he did hit his head when he fell, but did notloseconsciousness with this fall (notesloss of consciousness with previous falls).  CT scan of the brain and neck showed no acute intracranial or cervical abnormalities. [name] the hipshow a communicated intertrochanteric femoral neck fracture. Patient was given 25 mcgof fentanyl initially for pain and subsequently 1 mg of Dilaudid. Dr. Sharol Given of Orthopedics completed surgical repair 12/04/16. Pt observed to choke with meal, was feeding himself rapidly, has trouble choking at home. Pt has a histoyr of traumatic brain injury in 2015. Had OP MBS on 03/23/17, showed silent aspiration before the swallow of thin liquids. Pt at the time was healthy otherwise and advised to clear throat after swallowing and complete regular oral care, but if any decline accurred he may need to consume nectar thick liquids as these were tolerated well. CXR clear on admission and after choking event.  No Data Recorded Assessment / Plan / Recommendation CHL IP CLINICAL IMPRESSIONS 12/06/2017 Clinical Impression Pt demonstrates swallow function not outside of normal limits for age. Strength is WNL. Timing of swallow is slightly delayed with only one instance of  trace aspiration just to the bottom of the cords with consecutive sips of thin liquids out of 6 oz of intake. Pt briefly orally holds bolus which also seems to help with more controlled initiation of swallow. The pt is able to masticate adequately, though wife reports he is highly impulsive with meals and needs assist. Given recent choking episode at bedside, will resume a puree diet with thin liquids for now and advance as pt is able to demonstrate safe rate and adequate level of alertness for meals. Pt must be fully upright to be successful, suspect coughing during BSE may have been due to slightly reclined position in chair. Esophageal sweep WNL.  SLP Visit Diagnosis Dysphagia, oropharyngeal phase (R13.12) Attention and concentration deficit following -- Frontal lobe and executive function deficit following -- Impact on safety and function Mild aspiration risk   CHL IP TREATMENT RECOMMENDATION 12/06/2017 Treatment Recommendations Therapy as outlined in treatment plan below   Prognosis 12/06/2017 Prognosis for Safe Diet Advancement Good Barriers to Reach Goals -- Barriers/Prognosis Comment -- CHL IP DIET RECOMMENDATION 12/06/2017 SLP Diet Recommendations Dysphagia 1 (Puree) solids;Thin liquid Liquid Administration via Cup;Straw Medication Administration Whole meds with liquid Compensations Slow rate;Small sips/bites;Minimize environmental distractions Postural Changes Seated upright at 90 degrees   CHL IP OTHER RECOMMENDATIONS 12/06/2017 Recommended Consults -- Oral Care Recommendations Oral care BID Other Recommendations --   CHL IP FOLLOW UP RECOMMENDATIONS 12/06/2017 Follow up Recommendations Skilled Nursing facility   [facility] IP FREQUENCY AND DURATION 12/06/2017 Speech Therapy Frequency (ACUTE ONLY) min 2x/week Treatment Duration 2 weeks      CHL IP ORAL PHASE 12/06/2017 Oral Phase WFL Oral - Pudding Teaspoon -- Oral - Pudding Cup -- Oral - Honey Teaspoon -- Oral - Honey Cup -- Oral -  Nectar Teaspoon -- Oral - Nectar Cup --  Oral - Nectar Straw -- Oral - Thin Teaspoon -- Oral - Thin Cup -- Oral - Thin Straw -- Oral - Puree -- Oral - Mech Soft -- Oral - Regular -- Oral - Multi-Consistency -- Oral - Pill -- Oral Phase - Comment --  CHL IP PHARYNGEAL PHASE 12/06/2017 Pharyngeal Phase Impaired Pharyngeal- Pudding Teaspoon -- Pharyngeal -- Pharyngeal- Pudding Cup -- Pharyngeal -- Pharyngeal- Honey Teaspoon -- Pharyngeal -- Pharyngeal- Honey Cup -- Pharyngeal -- Pharyngeal- Nectar Teaspoon -- Pharyngeal -- Pharyngeal- Nectar Cup Delayed swallow initiation-pyriform sinuses Pharyngeal -- Pharyngeal- Nectar Straw Delayed swallow initiation-pyriform sinuses Pharyngeal -- Pharyngeal- Thin Teaspoon -- Pharyngeal -- Pharyngeal- Thin Cup Delayed swallow initiation-pyriform sinuses Pharyngeal -- Pharyngeal- Thin Straw Delayed swallow initiation-pyriform sinuses;Penetration/Aspiration before swallow;Trace aspiration Pharyngeal Material enters airway, CONTACTS cords and not ejected out;Material does not enter airway Pharyngeal- Puree WFL Pharyngeal -- Pharyngeal- Mechanical Soft -- Pharyngeal -- Pharyngeal- Regular Delayed swallow initiation-vallecula Pharyngeal -- Pharyngeal- Multi-consistency -- Pharyngeal -- Pharyngeal- Pill -- Pharyngeal -- Pharyngeal Comment --  CHL IP CERVICAL ESOPHAGEAL PHASE 12/06/2017 Cervical Esophageal Phase WFL Pudding Teaspoon -- Pudding Cup -- Honey Teaspoon -- Honey Cup -- Nectar Teaspoon -- Nectar Cup -- Nectar Straw -- Thin Teaspoon -- Thin Cup -- Thin Straw -- Puree -- Mechanical Soft -- Regular -- Multi-consistency -- Pill -- Cervical Esophageal Comment -- CHL IP GO 03/23/2017 Functional Assessment Tool Used clinical judgement Functional Limitations Swallowing Swallow Current Status (F5732) CJ Swallow Goal Status (K0254) CJ Swallow Discharge Status (Y7062) CJ Motor Speech Current Status (B7628) (None) Motor Speech Goal Status (B1517) (None) Motor Speech Goal Status (O1607) (None) Spoken Language Comprehension Current  Status (P7106) (None) Spoken Language Comprehension Goal Status (Y6948) (None) Spoken Language Comprehension Discharge Status (N4627) (None) Spoken Language Expression Current Status (O3500) (None) Spoken Language Expression Goal Status (X3818) (None) Spoken Language Expression Discharge Status (E9937) (None) Attention Current Status (J6967) (None) Attention Goal Status (E9381) (None) Attention Discharge Status (O1751) (None) Memory Current Status (W2585) (None) Memory Goal Status (I7782) (None) Memory Discharge Status (U2353) (None) Voice Current Status (I1443) (None) Voice Goal Status (X5400) (None) Voice Discharge Status (Q6761) (None) Other Speech-Language Pathology Functional Limitation Current Status (P5093) (None) Other Speech-Language Pathology Functional Limitation Goal Status (O6712) (None) Other Speech-Language Pathology Functional Limitation Discharge Status [phone]) (None) Herbie Baltimore, MA CCC-SLP [phone] Lynann Beaver 12/06/2017, 2:32 PM              Dg C-arm 1-60 Min  Result Date: 12/04/2017 CLINICAL DATA:  Internal fixation of a intertrochanteric fracture. EXAM: OPERATIVE right HIP (WITH PELVIS IF PERFORMED) 2 VIEWS TECHNIQUE: Fluoroscopic spot image(s) were submitted for interpretation post-operatively. COMPARISON:  Radiographs 12/02/2017 FINDINGS: There is an intramedullary gamma nail with a proximal dynamic hip screw and a distal interlocking screw transfixing the intertrochanteric fracture. Anatomic reduction without complicating features. IMPRESSION: Close reduction and internal fixation of a right hip intertrochanteric fracture with anatomic reduction. Electronically Signed   By: Marijo Sanes M.D.   On: 12/04/2017 19:28   Dg Hip Operative Unilat W Or W/o Pelvis Right  Result Date: 12/04/2017 CLINICAL DATA:  Internal fixation of a intertrochanteric fracture. EXAM: OPERATIVE right HIP (WITH PELVIS IF PERFORMED) 2 VIEWS TECHNIQUE: Fluoroscopic spot image(s) were submitted for  interpretation post-operatively. COMPARISON:  Radiographs 12/02/2017 FINDINGS: There is an intramedullary gamma nail with a proximal dynamic hip screw and a distal interlocking screw transfixing the intertrochanteric fracture. Anatomic reduction without complicating features. IMPRESSION: Close reduction and internal fixation  of a right hip intertrochanteric fracture with anatomic reduction. Electronically Signed   By: Marijo Sanes M.D.   On: 12/04/2017 19:28   Dg Hip Unilat  With Pelvis 2-3 Views Right  Result Date: 12/02/2017 CLINICAL DATA:  Patient reports a fall today.  Pain. EXAM: DG HIP (WITH OR WITHOUT PELVIS) 2-3V RIGHT COMPARISON:  01/24/2016 FINDINGS: Bones are diffusely demineralized. Comminuted intertrochanteric right femoral neck fracture with varus angulation evident. Lesser trochanter exists as a free fragment. Cross-table lateral films are nondiagnostic. SI joints and symphysis pubis unremarkable. Penile prosthetic device evident. IMPRESSION: Comminuted intertrochanteric right femoral neck fracture with varus angulation. Electronically Signed   By: Misty Stanley M.D.   On: 12/02/2017 19:09   Ct Head Code Stroke Wo Contrast  Result Date: 12/10/2017 CLINICAL DATA:  Code stroke. Code stroke. Right-sided facial droop. Aphasia. The patient fell out of bed today with trauma to head. EXAM: CT HEAD WITHOUT CONTRAST TECHNIQUE: Contiguous axial images were obtained from the base of the skull through the vertex without intravenous contrast. COMPARISON:  CT head without contrast 12/02/2016 FINDINGS: Brain: Moderate atrophy and white matter changes are stable. No acute infarct, hemorrhage, or mass lesion is present. Basal ganglia are stable. Insular ribbon is intact. The brainstem and cerebellum are normal. Vascular: Vascular calcifications are present at the cavernous internal carotid arteries bilaterally. There is no hyperdense vessel. Skull: Calvarium is intact. Left supraorbital scalp soft tissue  swelling and hematoma is present without an underlying fracture. Sinuses/Orbits: Mild mucosal thickening is present in the left maxillary sinus. The remaining paranasal sinuses and mastoid air cells are clear. ASPECTS [facility] Stroke Program Early CT Score) - Ganglionic level infarction (caudate, lentiform nuclei, internal capsule, insula, M1-M3 cortex): 7/7 - Supraganglionic infarction (M4-M6 cortex): 3/3 Total score (0-10 with 10 being normal): 10/10 IMPRESSION: 1. Stable moderate atrophy and white matter disease without acute intracranial abnormality. 2. Left supraorbital scalp soft tissue swelling and hematoma without underlying fracture. 3. ASPECTS is 10/10 Electronically Signed   By: San Morelle M.D.   On: 12/10/2017 12:44    Assessment/Plan  1. Displaced fracture of proximal phalanx of right little finger, subsequent encounter for fracture with routine healing - continue buddy-splinting together with the 4th finger, follow-up with hand doctor   2. Slow transit constipation - start Senna-S 8.6-50 mg 2 tabs PO BID   3. Displaced intertrochanteric fracture of right femur, initial encounter for closed fracture [facility]) - will follow-up with orthopedics, Dr. Sharol Given, on 12/21/16, continue wound treatment to right hip, continue Norco 5-325 mg 1 tab every 6 hours when necessary and acetaminophen 500 mg 1 tab every 6 hours when necessary for pain, aspirin 325 mg 1 tab daily for DVT prophylaxis, continue rehabilitation with PT and OT, for therapeutic strengthening exercises, fall precautions  4. Chronic kidney disease, stage III - creatinine 43.96, check BMP in 2 weeks    Family/ staff Communication: Discussed plan of care with patient, wife and charge nurse.  Labs/tests ordered:  BMP in 2 weeks  Goals of care:   Short-term rehabilitation    Durenda Age, NP [facility] [phone] (Monday-Friday 8:00 a.m. - 5:00 p.m.) [phone] (after  hours)     .mm

## 2017-12-19 ENCOUNTER — Other Ambulatory Visit (INDEPENDENT_AMBULATORY_CARE_PROVIDER_SITE_OTHER): Payer: Self-pay | Admitting: Radiology

## 2017-12-21 ENCOUNTER — Ambulatory Visit (INDEPENDENT_AMBULATORY_CARE_PROVIDER_SITE_OTHER): Payer: PRIVATE HEALTH INSURANCE

## 2017-12-21 ENCOUNTER — Ambulatory Visit (INDEPENDENT_AMBULATORY_CARE_PROVIDER_SITE_OTHER): Payer: PRIVATE HEALTH INSURANCE | Admitting: Orthopedic Surgery

## 2017-12-21 ENCOUNTER — Encounter (INDEPENDENT_AMBULATORY_CARE_PROVIDER_SITE_OTHER): Payer: Self-pay | Admitting: Family

## 2017-12-21 VITALS — Ht 72.0 in | Wt 237.0 lb

## 2017-12-21 DIAGNOSIS — M25551 Pain in right hip: Secondary | ICD-10-CM

## 2017-12-21 DIAGNOSIS — S72141A Displaced intertrochanteric fracture of right femur, initial encounter for closed fracture: Secondary | ICD-10-CM | POA: Diagnosis not present

## 2017-12-21 MED ORDER — ASPIRIN EC 325 MG PO TBEC: 325.0000 mg | DELAYED_RELEASE_TABLET | Freq: Every day | ORAL | 0 refills | Status: AC
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
## 2017-12-29 ENCOUNTER — Encounter: Payer: Self-pay | Admitting: Adult Health

## 2017-12-29 ENCOUNTER — Non-Acute Institutional Stay (SKILLED_NURSING_FACILITY): Payer: Medicare Other | Admitting: Adult Health

## 2017-12-29 DIAGNOSIS — J189 Pneumonia, unspecified organism: Secondary | ICD-10-CM

## 2017-12-29 NOTE — Progress Notes (Signed)
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 10/06/2014  Procedure: PERMANENT PACEMAKER INSERTION;  Surgeon: Evans Lance, MD;  Location: [facility] CATH LAB;  Service: Cardiovascular;  Laterality: N/A; . REPLACEMENT TOTAL KNEE  04/2010  RIGHT KNEE . right shoulder   . TONSILLECTOMY   . US ECHOCARDIOGRAPHY  03/2013  inf/septal hypokinesis, mild LVH, EF 45%, LA mildly dilated HPI: Frank Simpson a 84 y.o.malewith medical history significant ofHTN,MI,CAD s/pDES to LAD, Diastolic dysfunction last EF 55-60% with grade 2 dysfunction,CHBs/p PPM,DM type II with peripheral neuropathy, Basal cell skin cancers/premoval, syncope, andrecurrent falls with previous SDHin 10/2016;who presents after having a mechanicalfallat home reportingexcruciating right leg pain. Patient notes that he tripped over his walker which he normally uses at baseline to ambulate. He has a long history of falls, and been noted to be progressively getting weaker. He states that he did hit his head when he fell, but did notloseconsciousness with this fall (notesloss of consciousness with previous falls).  CT scan of the brain and neck showed no acute  intracranial or cervical abnormalities. [name] the hipshow a communicated intertrochanteric femoral neck fracture. Patient was given 25 mcgof fentanyl initially for pain and subsequently 1 mg of Dilaudid. Dr. Sharol Given of Orthopedics completed surgical repair 12/04/16. Pt observed to choke with meal, was feeding himself rapidly, has trouble choking at home. Pt has a histoyr of traumatic brain injury in 2015. Had OP MBS on 03/23/17, showed silent aspiration before the swallow of thin liquids. Pt at the time was healthy otherwise and advised to clear throat after swallowing and complete regular oral care, but if any decline accurred he may need to consume nectar thick liquids as these were tolerated well. CXR clear on admission and after choking event.  No Data Recorded Assessment / Plan / Recommendation [facility] IP CLINICAL IMPRESSIONS 12/06/2017 Clinical Impression Pt demonstrates swallow function not outside of normal limits for age. Strength is WNL. Timing of swallow is slightly delayed with only one instance of trace aspiration just to the bottom of the cords with consecutive sips of thin liquids out of 6 oz of intake. Pt briefly orally holds bolus which also seems to help with more controlled initiation of swallow. The pt is able to masticate adequately, though wife reports he is highly impulsive with meals and needs assist. Given recent choking episode at bedside, will resume a puree diet with thin liquids for now and advance as pt is able to demonstrate safe rate and adequate level of alertness for meals. Pt must be fully upright to be successful, suspect coughing during BSE may have been due to slightly reclined position in chair. Esophageal sweep WNL.  SLP Visit Diagnosis Dysphagia, oropharyngeal phase (R13.12) Attention and concentration deficit following -- Frontal lobe and executive function deficit following -- Impact on safety and function Mild aspiration risk   [facility] IP TREATMENT RECOMMENDATION 12/06/2017  Treatment Recommendations Therapy as outlined in treatment plan below   Prognosis 12/06/2017 Prognosis for Safe Diet Advancement Good Barriers to Reach Goals -- Barriers/Prognosis Comment -- [facility] IP DIET RECOMMENDATION 12/06/2017 SLP Diet Recommendations Dysphagia 1 (Puree) solids;Thin liquid Liquid Administration via Cup;Straw Medication Administration Whole meds with liquid Compensations Slow rate;Small sips/bites;Minimize environmental distractions Postural Changes Seated upright at 90 degrees   [facility] IP OTHER RECOMMENDATIONS 12/06/2017 Recommended Consults -- Oral Care Recommendations Oral care BID Other Recommendations --   [facility] IP FOLLOW UP RECOMMENDATIONS 12/06/2017 Follow up Recommendations Skilled Nursing facility   [facility] IP FREQUENCY AND DURATION 12/06/2017 Speech Therapy Frequency (ACUTE ONLY) min 2x/week Treatment Duration 2 weeks      [facility] IP ORAL PHASE 12/06/2017 Oral Phase WFL Oral - Pudding Teaspoon -- Oral - Pudding Cup -- Oral - Honey Teaspoon -- Oral - Honey Cup -- Oral - Nectar Teaspoon -- Oral - Nectar Cup -- Oral - Nectar Straw -- Oral - Thin Teaspoon -- Oral - Thin Cup -- Oral - Thin Straw -- Oral - Puree -- Oral - Mech Soft -- Oral - Regular -- Oral - Multi-Consistency -- Oral - Pill -- Oral Phase - Comment --  CHL IP PHARYNGEAL PHASE 12/06/2017 Pharyngeal Phase Impaired Pharyngeal- Pudding Teaspoon -- Pharyngeal -- Pharyngeal- Pudding Cup -- Pharyngeal -- Pharyngeal- Honey Teaspoon -- Pharyngeal -- Pharyngeal- Honey Cup -- Pharyngeal -- Pharyngeal- Nectar Teaspoon -- Pharyngeal -- Pharyngeal- Nectar Cup Delayed swallow initiation-pyriform sinuses Pharyngeal -- Pharyngeal- Nectar Straw Delayed swallow initiation-pyriform sinuses Pharyngeal -- Pharyngeal- Thin Teaspoon -- Pharyngeal -- Pharyngeal- Thin Cup Delayed swallow initiation-pyriform sinuses Pharyngeal --  Pharyngeal- Thin Straw Delayed swallow initiation-pyriform sinuses;Penetration/Aspiration before swallow;Trace aspiration Pharyngeal Material enters  airway, CONTACTS cords and not ejected out;Material does not enter airway Pharyngeal- Puree WFL Pharyngeal -- Pharyngeal- Mechanical Soft -- Pharyngeal -- Pharyngeal- Regular Delayed swallow initiation-vallecula Pharyngeal -- Pharyngeal- Multi-consistency -- Pharyngeal -- Pharyngeal- Pill -- Pharyngeal -- Pharyngeal Comment --  CHL IP CERVICAL ESOPHAGEAL PHASE 12/06/2017 Cervical Esophageal Phase WFL Pudding Teaspoon -- Pudding Cup -- Honey Teaspoon -- Honey Cup -- Nectar Teaspoon -- Nectar Cup -- Nectar Straw -- Thin Teaspoon -- Thin Cup -- Thin Straw -- Puree -- Mechanical Soft -- Regular -- Multi-consistency -- Pill -- Cervical Esophageal Comment -- CHL IP GO 03/23/2017 Functional Assessment Tool Used clinical judgement Functional Limitations Swallowing Swallow Current Status (X9371) CJ Swallow Goal Status (I9678) CJ Swallow Discharge Status (L3810) CJ Motor Speech Current Status (F7510) (None) Motor Speech Goal Status (C5852) (None) Motor Speech Goal Status (D7824) (None) Spoken Language Comprehension Current Status (M3536) (None) Spoken Language Comprehension Goal Status (R4431) (None) Spoken Language Comprehension Discharge Status (V4008) (None) Spoken Language Expression Current Status (Q7619) (None) Spoken Language Expression Goal Status (J0932) (None) Spoken Language Expression Discharge Status (I7124) (None) Attention Current Status (P8099) (None) Attention Goal Status (I3382) (None) Attention Discharge Status (N0539) (None) Memory Current Status (J6734) (None) Memory Goal Status (L9379) (None) Memory Discharge Status (K2409) (None) Voice Current Status (B3532) (None) Voice Goal Status (D9242) (None) Voice Discharge Status (A8341) (None) Other Speech-Language Pathology Functional Limitation Current Status (D6222) (None) Other Speech-Language Pathology Functional Limitation Goal Status (L7989) (None) Other Speech-Language Pathology Functional Limitation Discharge Status [id number]) (None) Herbie Baltimore, MA  CCC-SLP [phone] Lynann Beaver 12/06/2017, 2:32 PM              Dg C-arm 1-60 Min  Result Date: 12/04/2017 CLINICAL DATA:  Internal fixation of a intertrochanteric fracture. EXAM: OPERATIVE right HIP (WITH PELVIS IF PERFORMED) 2 VIEWS TECHNIQUE: Fluoroscopic spot image(s) were submitted for interpretation post-operatively. COMPARISON:  Radiographs 12/02/2017 FINDINGS: There is an intramedullary gamma nail with a proximal dynamic hip screw and a distal interlocking screw transfixing the intertrochanteric fracture. Anatomic reduction without complicating features. IMPRESSION: Close reduction and internal fixation of a right hip intertrochanteric fracture with anatomic reduction. Electronically Signed   By: Marijo Sanes M.D.   On: 12/04/2017 19:28   Dg Hip Operative Unilat W Or W/o Pelvis Right  Result Date: 12/04/2017 CLINICAL DATA:  Internal fixation of a intertrochanteric fracture. EXAM: OPERATIVE right HIP (WITH PELVIS IF PERFORMED) 2 VIEWS TECHNIQUE: Fluoroscopic spot image(s) were submitted for interpretation post-operatively. COMPARISON:  Radiographs 12/02/2017 FINDINGS: There is an intramedullary gamma nail with a proximal dynamic hip screw and a distal interlocking screw transfixing the intertrochanteric fracture. Anatomic reduction without complicating features. IMPRESSION: Close reduction and internal fixation of a right hip intertrochanteric fracture with anatomic reduction. Electronically Signed   By: Marijo Sanes M.D.   On: 12/04/2017 19:28   Dg Hip Unilat  With Pelvis 2-3 Views Right  Result Date: 12/02/2017 CLINICAL DATA:  Patient reports a fall today.  Pain. EXAM: DG HIP (WITH OR WITHOUT PELVIS) 2-3V RIGHT COMPARISON:  01/24/2016 FINDINGS: Bones are diffusely demineralized. Comminuted intertrochanteric right femoral neck fracture with varus angulation evident. Lesser trochanter exists as a free fragment. Cross-table lateral films are nondiagnostic. SI joints and symphysis pubis  unremarkable. Penile prosthetic device evident. IMPRESSION: Comminuted intertrochanteric right femoral neck fracture with varus angulation. Electronically Signed   By: Misty Stanley M.D.   On: 12/02/2017 19:09   Xr Hip Unilat  W Or W/o Pelvis 2-3 Views Right  Result Date: 12/21/2017 2 view radiographs of the right hip shows some consolidation at the intertrochanteric fracture.  The hardware is stable with good alignment no hardware failure.  Ct Head Code Stroke Wo Contrast  Result Date: 12/10/2017 CLINICAL DATA:  Code stroke. Code stroke. Right-sided facial droop. Aphasia. The patient fell out of bed today with trauma to head. EXAM: CT HEAD WITHOUT CONTRAST TECHNIQUE: Contiguous axial images were obtained from the base of the skull through the vertex without intravenous contrast. COMPARISON:  CT head without contrast 12/02/2016 FINDINGS: Brain: Moderate atrophy and white matter changes are stable. No acute infarct, hemorrhage, or mass lesion is present. Basal ganglia are stable. Insular ribbon is intact. The brainstem and cerebellum are normal. Vascular: Vascular calcifications are present at the cavernous internal carotid arteries bilaterally. There is no hyperdense vessel. Skull: Calvarium is intact. Left supraorbital scalp soft tissue swelling and hematoma is present without an underlying fracture. Sinuses/Orbits: Mild mucosal thickening is present in the left maxillary sinus. The remaining paranasal sinuses and mastoid air cells are clear. ASPECTS [facility] Stroke Program Early CT Score) - Ganglionic level infarction (caudate, lentiform nuclei, internal capsule, insula, M1-M3 cortex): 7/7 - Supraganglionic infarction (M4-M6 cortex): 3/3 Total score (0-10 with 10 being normal): 10/10 IMPRESSION: 1. Stable moderate atrophy and white matter disease without acute intracranial abnormality. 2. Left supraorbital scalp soft tissue swelling and hematoma without underlying fracture. 3. ASPECTS is 10/10  Electronically Signed   By: San Morelle M.D.   On: 12/10/2017 12:44    Assessment/Plan  1. HCAP (healthcare-associated pneumonia) - will start doxycycline 100 mg 1 tab by mouth twice a day 10 days and Florastor 250 mg 1 capsule by mouth twice a day 13 days, albutero 2.5 mg/27m 1 neb Q 6AM, 2PM and 10 PM X 5 days    Family/ staff Communication:  Discussed plan of care with patient and wife.  Labs/tests ordered:  None  Goals of care:   Short-term rehabilitation   MDurenda Age NP PGreen[facility] LPand Adult Medicine [phone](Monday-Friday 8:00 a.m. - 5:00 p.m.) [phone](after hours)

## 2018-01-04 ENCOUNTER — Encounter: Payer: Self-pay | Admitting: Adult Health

## 2018-01-04 ENCOUNTER — Emergency Department (HOSPITAL_COMMUNITY)
Admission: EM | Admit: 2018-01-04 | Discharge: 2018-01-04 | Disposition: A | Discharge status: Home / Self Care | Payer: Medicare Other | Attending: Emergency Medicine | Admitting: Emergency Medicine

## 2018-01-04 ENCOUNTER — Non-Acute Institutional Stay (SKILLED_NURSING_FACILITY): Payer: Medicare Other | Admitting: Adult Health

## 2018-01-04 ENCOUNTER — Emergency Department (HOSPITAL_COMMUNITY): Payer: Medicare Other

## 2018-01-04 ENCOUNTER — Encounter (HOSPITAL_COMMUNITY): Payer: Self-pay

## 2018-01-04 DIAGNOSIS — I10 Essential (primary) hypertension: Secondary | ICD-10-CM

## 2018-01-04 DIAGNOSIS — Y999 Unspecified external cause status: Secondary | ICD-10-CM | POA: Insufficient documentation

## 2018-01-04 DIAGNOSIS — F339 Major depressive disorder, recurrent, unspecified: Secondary | ICD-10-CM | POA: Diagnosis not present

## 2018-01-04 DIAGNOSIS — E11319 Type 2 diabetes mellitus with unspecified diabetic retinopathy without macular edema: Secondary | ICD-10-CM | POA: Insufficient documentation

## 2018-01-04 DIAGNOSIS — J309 Allergic rhinitis, unspecified: Secondary | ICD-10-CM

## 2018-01-04 DIAGNOSIS — Z794 Long term (current) use of insulin: Secondary | ICD-10-CM | POA: Diagnosis not present

## 2018-01-04 DIAGNOSIS — Z7982 Long term (current) use of aspirin: Secondary | ICD-10-CM | POA: Insufficient documentation

## 2018-01-04 DIAGNOSIS — I130 Hypertensive heart and chronic kidney disease with heart failure and stage 1 through stage 4 chronic kidney disease, or unspecified chronic kidney disease: Secondary | ICD-10-CM | POA: Insufficient documentation

## 2018-01-04 DIAGNOSIS — I2510 Atherosclerotic heart disease of native coronary artery without angina pectoris: Secondary | ICD-10-CM

## 2018-01-04 DIAGNOSIS — E113293 Type 2 diabetes mellitus with mild nonproliferative diabetic retinopathy without macular edema, bilateral: Secondary | ICD-10-CM

## 2018-01-04 DIAGNOSIS — S0181XA Laceration without foreign body of other part of head, initial encounter: Secondary | ICD-10-CM | POA: Diagnosis not present

## 2018-01-04 DIAGNOSIS — Y939 Activity, unspecified: Secondary | ICD-10-CM | POA: Insufficient documentation

## 2018-01-04 DIAGNOSIS — W06XXXA Fall from bed, initial encounter: Secondary | ICD-10-CM | POA: Insufficient documentation

## 2018-01-04 DIAGNOSIS — N183 Chronic kidney disease, stage 3 unspecified: Secondary | ICD-10-CM

## 2018-01-04 DIAGNOSIS — S299XXA Unspecified injury of thorax, initial encounter: Secondary | ICD-10-CM | POA: Diagnosis not present

## 2018-01-04 DIAGNOSIS — J189 Pneumonia, unspecified organism: Secondary | ICD-10-CM

## 2018-01-04 DIAGNOSIS — S0990XA Unspecified injury of head, initial encounter: Secondary | ICD-10-CM | POA: Diagnosis not present

## 2018-01-04 DIAGNOSIS — F0390 Unspecified dementia without behavioral disturbance: Secondary | ICD-10-CM | POA: Diagnosis not present

## 2018-01-04 DIAGNOSIS — Z950 Presence of cardiac pacemaker: Secondary | ICD-10-CM | POA: Insufficient documentation

## 2018-01-04 DIAGNOSIS — S62616D Displaced fracture of proximal phalanx of right little finger, subsequent encounter for fracture with routine healing: Secondary | ICD-10-CM

## 2018-01-04 DIAGNOSIS — S72141A Displaced intertrochanteric fracture of right femur, initial encounter for closed fracture: Secondary | ICD-10-CM | POA: Diagnosis not present

## 2018-01-04 DIAGNOSIS — Z96651 Presence of right artificial knee joint: Secondary | ICD-10-CM | POA: Insufficient documentation

## 2018-01-04 DIAGNOSIS — S79911A Unspecified injury of right hip, initial encounter: Secondary | ICD-10-CM | POA: Diagnosis not present

## 2018-01-04 DIAGNOSIS — I5042 Chronic combined systolic (congestive) and diastolic (congestive) heart failure: Secondary | ICD-10-CM | POA: Insufficient documentation

## 2018-01-04 DIAGNOSIS — S62616A Displaced fracture of proximal phalanx of right little finger, initial encounter for closed fracture: Secondary | ICD-10-CM | POA: Diagnosis not present

## 2018-01-04 DIAGNOSIS — S199XXA Unspecified injury of neck, initial encounter: Secondary | ICD-10-CM | POA: Diagnosis not present

## 2018-01-04 DIAGNOSIS — R531 Weakness: Secondary | ICD-10-CM

## 2018-01-04 DIAGNOSIS — Z79899 Other long term (current) drug therapy: Secondary | ICD-10-CM | POA: Insufficient documentation

## 2018-01-04 DIAGNOSIS — Y92129 Unspecified place in nursing home as the place of occurrence of the external cause: Secondary | ICD-10-CM | POA: Insufficient documentation

## 2018-01-04 DIAGNOSIS — E1122 Type 2 diabetes mellitus with diabetic chronic kidney disease: Secondary | ICD-10-CM | POA: Insufficient documentation

## 2018-01-04 DIAGNOSIS — S79921A Unspecified injury of right thigh, initial encounter: Secondary | ICD-10-CM | POA: Diagnosis not present

## 2018-01-04 DIAGNOSIS — W19XXXA Unspecified fall, initial encounter: Secondary | ICD-10-CM

## 2018-01-04 NOTE — Progress Notes (Addendum)
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 ANGIOGRAM;  Surgeon: Burnell Blanks, MD;  Location: [facility] CATH LAB;  Service: Cardiovascular;  Laterality: N/A; . LEFT HEART CATHETERIZATION WITH CORONARY ANGIOGRAM N/A 10/15/2012  Procedure: LEFT HEART CATHETERIZATION WITH CORONARY ANGIOGRAM;  Surgeon: Peter M Martinique, MD;  Location: [facility] CATH LAB;  Service: Cardiovascular;  Laterality: N/A; . lexiscan myoview  10/2011  negative for ischemia . PENILE PROSTHESIS IMPLANT   . PERMANENT PACEMAKER INSERTION N/A 10/06/2014  Procedure: PERMANENT PACEMAKER INSERTION;  Surgeon: Evans Lance, MD;  Location: [facility] CATH LAB;  Service: Cardiovascular;  Laterality: N/A; . REPLACEMENT TOTAL KNEE  04/2010  RIGHT KNEE . right shoulder   . TONSILLECTOMY   . US ECHOCARDIOGRAPHY  03/2013  inf/septal hypokinesis, mild LVH, EF 45%, LA mildly dilated HPI: Frank Simpson a 84 y.o.malewith medical history significant ofHTN,MI,CAD s/pDES to LAD, Diastolic dysfunction last EF 55-60% with grade 2 dysfunction,CHBs/p  PPM,DM type II with peripheral neuropathy, Basal cell skin cancers/premoval, syncope, andrecurrent  falls with previous [facility] 10/2016;who presents after having a mechanicalfallat home reportingexcruciating right leg pain. Patient notes that he tripped over his walker which he normally uses at baseline to ambulate. He has a long history of falls, and been noted to be progressively getting weaker. He states that he did hit his head when he fell, but did notloseconsciousness with this fall (notesloss of consciousness with previous falls).  CT scan of the brain and neck showed no acute intracranial or cervical abnormalities. [name] the hipshow a communicated intertrochanteric femoral neck fracture. Patient was given 25 mcgof fentanyl initially for pain and subsequently 1 mg of Dilaudid. Dr. Sharol Given of Orthopedics completed surgical repair 12/04/16. Pt observed to choke with meal, was feeding himself rapidly, has trouble choking at home. Pt has a histoyr of traumatic brain injury in 2015. Had OP MBS on 03/23/17, showed silent aspiration before the swallow of thin liquids. Pt at the time was healthy otherwise and advised to clear throat after swallowing and complete regular oral care, but if any decline accurred he may need to consume nectar thick liquids as these were tolerated well. CXR clear on admission and after choking event.  No Data Recorded Assessment / Plan / Recommendation CHL IP CLINICAL IMPRESSIONS 12/06/2017 Clinical Impression Pt demonstrates swallow function not outside of normal limits for age. Strength is WNL. Timing of swallow is slightly delayed with only one instance of trace aspiration just to the bottom of the cords with consecutive sips of thin liquids out of 6 oz of intake. Pt briefly orally holds bolus which also seems to help with more controlled initiation of swallow. The pt is able to masticate adequately, though wife reports he is highly impulsive with meals and needs assist. Given recent choking episode at bedside, will resume a puree diet with thin liquids for now and advance as  pt is able to demonstrate safe rate and adequate level of alertness for meals. Pt must be fully upright to be successful, suspect coughing during BSE may have been due to slightly reclined position in chair. Esophageal sweep WNL.  SLP Visit Diagnosis Dysphagia, oropharyngeal phase (R13.12) Attention and concentration deficit following -- Frontal lobe and executive function deficit following -- Impact on safety and function Mild aspiration risk   CHL IP TREATMENT RECOMMENDATION 12/06/2017 Treatment Recommendations Therapy as outlined in treatment plan below   Prognosis 12/06/2017 Prognosis for Safe Diet Advancement Good Barriers to Reach Goals -- Barriers/Prognosis Comment -- CHL IP DIET RECOMMENDATION 12/06/2017 SLP Diet Recommendations Dysphagia 1 (Puree) solids;Thin liquid Liquid Administration via Cup;Straw Medication Administration Whole meds with liquid Compensations Slow rate;Small sips/bites;Minimize environmental distractions Postural Changes Seated upright at 90 degrees   CHL IP OTHER RECOMMENDATIONS 12/06/2017 Recommended Consults -- Oral Care Recommendations Oral care BID Other Recommendations --   CHL IP FOLLOW UP RECOMMENDATIONS 12/06/2017 Follow up Recommendations Skilled Nursing facility   [facility] IP FREQUENCY AND DURATION 12/06/2017 Speech Therapy Frequency (ACUTE ONLY) min 2x/week Treatment Duration 2 weeks      CHL IP ORAL PHASE 12/06/2017 Oral Phase WFL Oral - Pudding Teaspoon -- Oral - Pudding Cup -- Oral - Honey Teaspoon -- Oral - Honey Cup -- Oral - Nectar Teaspoon -- Oral - Nectar Cup -- Oral - Nectar Straw -- Oral - Thin Teaspoon -- Oral - Thin Cup -- Oral - Thin Straw -- Oral - Puree -- Oral - Mech Soft -- Oral - Regular -- Oral - Multi-Consistency --  Oral - Pill -- Oral Phase - Comment --  CHL IP PHARYNGEAL PHASE 12/06/2017 Pharyngeal Phase Impaired Pharyngeal- Pudding Teaspoon -- Pharyngeal -- Pharyngeal- Pudding Cup -- Pharyngeal -- Pharyngeal- Honey Teaspoon -- Pharyngeal -- Pharyngeal- Honey Cup --  Pharyngeal -- Pharyngeal- Nectar Teaspoon -- Pharyngeal -- Pharyngeal- Nectar Cup Delayed swallow initiation-pyriform sinuses Pharyngeal -- Pharyngeal- Nectar Straw Delayed swallow initiation-pyriform sinuses Pharyngeal -- Pharyngeal- Thin Teaspoon -- Pharyngeal -- Pharyngeal- Thin Cup Delayed swallow initiation-pyriform sinuses Pharyngeal -- Pharyngeal- Thin Straw Delayed swallow initiation-pyriform sinuses;Penetration/Aspiration before swallow;Trace aspiration Pharyngeal Material enters airway, CONTACTS cords and not ejected out;Material does not enter airway Pharyngeal- Puree WFL Pharyngeal -- Pharyngeal- Mechanical Soft -- Pharyngeal -- Pharyngeal- Regular Delayed swallow initiation-vallecula Pharyngeal -- Pharyngeal- Multi-consistency -- Pharyngeal -- Pharyngeal- Pill -- Pharyngeal -- Pharyngeal Comment --  CHL IP CERVICAL ESOPHAGEAL PHASE 12/06/2017 Cervical Esophageal Phase WFL Pudding Teaspoon -- Pudding Cup -- Honey Teaspoon -- Honey Cup -- Nectar Teaspoon -- Nectar Cup -- Nectar Straw -- Thin Teaspoon -- Thin Cup -- Thin Straw -- Puree -- Mechanical Soft -- Regular -- Multi-consistency -- Pill -- Cervical Esophageal Comment -- CHL IP GO 03/23/2017 Functional Assessment Tool Used clinical judgement Functional Limitations Swallowing Swallow Current Status (P2330) CJ Swallow Goal Status (Q7622) CJ Swallow Discharge Status (Q3335) CJ Motor Speech Current Status (K5625) (None) Motor Speech Goal Status (W3893) (None) Motor Speech Goal Status (T3428) (None) Spoken Language Comprehension Current Status (J6811) (None) Spoken Language Comprehension Goal Status (X7262) (None) Spoken Language Comprehension Discharge Status (M3559) (None) Spoken Language Expression Current Status (R4163) (None) Spoken Language Expression Goal Status (A4536) (None) Spoken Language Expression Discharge Status (I6803) (None) Attention Current Status (O1224) (None) Attention Goal Status (M2500) (None) Attention Discharge Status (B7048) (None)  Memory Current Status (G8916) (None) Memory Goal Status (X4503) (None) Memory Discharge Status (U8828) (None) Voice Current Status (M0349) (None) Voice Goal Status (Z7915) (None) Voice Discharge Status (A5697) (None) Other Speech-Language Pathology Functional Limitation Current Status (X4801) (None) Other Speech-Language Pathology Functional Limitation Goal Status (K5537) (None) Other Speech-Language Pathology Functional Limitation Discharge Status [phone]) (None) Herbie Baltimore, MA CCC-SLP [phone] [place], Katherene Ponto 12/06/2017, 2:32 PM              Dg Hip Unilat W Or Wo Pelvis 2-3 Views Right  Result Date: 01/04/2018 CLINICAL DATA:  Fall EXAM: DG HIP (WITH OR WITHOUT PELVIS) 2-3V RIGHT COMPARISON:  01/24/2016, 12/21/2017 FINDINGS: Pubic symphysis and rami are intact. Linear lucency over the right pubic symphysis on pelvic view does not persist on dedicated views of the right hip. Penile implant. Status post intramedullary rod and screw fixation of proximal right femur for intertrochanteric fracture with stable fracture alignment. Displaced lesser trochanteric fracture fragment again noted. Some bony healing changes are suggested. Fracture lucencies remain visible. Calcified phleboliths in the pelvis. Vascular calcifications. IMPRESSION: 1. Intramedullary rodding of right femoral intertrochanteric fracture with stable appearance and no acute interval change compared with postoperative imaging Electronically Signed   By: Donavan Foil M.D.   On: 01/04/2018 02:34   Dg Femur Min 2 Views Right  Result Date: 01/04/2018 CLINICAL DATA:  Fall EXAM: RIGHT FEMUR 2 VIEWS COMPARISON:  12/02/2017 FINDINGS: Femoral rod and fixating screw in the shaft of the femur. Status post right knee replacement with normal alignment. No acute displaced femoral fracture is seen. Vascular calcification IMPRESSION: No acute osseous abnormality.  Status post right knee replacement. Electronically Signed   By: Donavan Foil M.D.   On:  01/04/2018 02:34   Xr Hip Unilat W  Or W/o Pelvis 2-3 Views Right  Result Date: 12/21/2017 2 view radiographs of the right hip shows some consolidation at the intertrochanteric fracture.  The hardware is stable with good alignment no hardware failure.  Ct Head Code Stroke Wo Contrast  Result Date: 12/10/2017 CLINICAL DATA:  Code stroke. Code stroke. Right-sided facial droop. Aphasia. The patient fell out of bed today with trauma to head. EXAM: CT HEAD WITHOUT CONTRAST TECHNIQUE: Contiguous axial images were obtained from the base of the skull through the vertex without intravenous contrast. COMPARISON:  CT head without contrast 12/02/2016 FINDINGS: Brain: Moderate atrophy and white matter changes are stable. No acute infarct, hemorrhage, or mass lesion is present. Basal ganglia are stable. Insular ribbon is intact. The brainstem and cerebellum are normal. Vascular: Vascular calcifications are present at the cavernous internal carotid arteries bilaterally. There is no hyperdense vessel. Skull: Calvarium is intact. Left supraorbital scalp soft tissue swelling and hematoma is present without an underlying fracture. Sinuses/Orbits: Mild mucosal thickening is present in the left maxillary sinus. The remaining paranasal sinuses and mastoid air cells are clear. ASPECTS [facility] Stroke Program Early CT Score) - Ganglionic level infarction (caudate, lentiform nuclei, internal capsule, insula, M1-M3 cortex): 7/7 - Supraganglionic infarction (M4-M6 cortex): 3/3 Total score (0-10 with 10 being normal): 10/10 IMPRESSION: 1. Stable moderate atrophy and white matter disease without acute intracranial abnormality. 2. Left supraorbital scalp soft tissue swelling and hematoma without underlying fracture. 3. ASPECTS is 10/10 Electronically Signed   By: San Morelle M.D.   On: 12/10/2017 12:44    Assessment/Plan  1. Generalized weakness - for rehabilitation with Home health PT and OT for therapeutic strengthening  exercises, fall precautions   2. Displaced intertrochanteric fracture of right femur, initial encounter for closed fracture (HCC) Nonweightbearing, follow-up with orthopedics, continue ASA EC 325 mg 1 tab Q D for DVT prophylaxis - HYDROcodone-acetaminophen (NORCO/VICODIN) 5-325 MG tablet; Take 1 tablet by mouth every 6 (six) hours as needed for moderate pain. #42 doses  Dispense: 30 tablet; Refill: 0  3. Displaced fracture of proximal phalanx of right little finger, subsequent encounter for fracture with routine healing - continue splinting of right 5th finger, follow-up with hand doctor   4. CKD (chronic kidney disease) stage 3, GFR 30-59 ml/min (HCC) - stable Lab Results  Component Value Date   CREATININE 1.4 (A) 12/15/2017    5. Type 2 diabetes mellitus with both eyes affected by mild nonproliferative retinopathy without macular edema, with long-term current use of insulin (HCC) - Insulin Glargine (BASAGLAR KWIKPEN) 100 UNIT/ML SOPN; Inject 0.35 mLs (35 Units total) into the skin 2 (two) times daily.  Dispense: 30 mL; Refill: 0 - insulin lispro (HUMALOG KWIKPEN) 100 UNIT/ML KiwkPen; INJECT 3 UNITS  TID with meals  Dispense: 15 mL; Refill: 0 - Insulin Pen Needle 32G X 4 MM MISC; 1 each by Does not apply route 4 (four) times daily.  Dispense: 200 each; Refill: 0 - glucose blood test strip; Use as instructed  Dispense: 100 each; Refill: 0 - Lancets (ONETOUCH ULTRASOFT) lancets; Use as instructed  Dispense: 100 each; Refill: 0  6. Coronary artery disease involving native heart, angina presence unspecified, unspecified vessel or lesion type - isosorbide mononitrate (IMDUR) 30 MG 24 hr tablet; Take 1 tablet (30 mg total) by mouth daily.  Dispense: 30 tablet; Refill: 0 - rosuvastatin (CRESTOR) 20 MG tablet; Take 1 tablet (20 mg total) by mouth daily.  Dispense: 30 tablet; Refill: 0  7. Depression, recurrent ([place])  - sertraline (  ZOLOFT) 25 MG tablet; Take 1 tablet (25 mg total) by mouth daily.   Dispense: 30 tablet; Refill: 0  8. Essential hypertension - well-controlled - amLODipine (NORVASC) 5 MG tablet; Take 1 tablet (5 mg total) by mouth daily.  Dispense: 30 tablet; Refill: 0  9. Allergic rhinitis, unspecified seasonality, unspecified trigger - continue Flonase 50 mcg 1 spray into each nostril BID   10. Dementia without behavioral disturbance, unspecified dementia type - continue supportive care, fall precautions  11. HCAP (healthcare-associated pneumonia) - doxycycline (VIBRA-TABS) 100 MG tablet; Take 1 tablet (100 mg total) by mouth 2 (two) times daily for 4 days.  Dispense: 7 tablet; Refill: 0 - saccharomyces boulardii (FLORASTOR) 250 MG capsule; Take 1 capsule (250 mg total) by mouth 2 (two) times daily for 6 days.  Dispense: 12 capsule; Refill: 0    I have filled out patient's discharge paperwork and written prescriptions.  Patient will receive home health PT, OT, SW, Nursing and Aide.  DME provided:  Standard wheelchair and 3-in-1 bedside commode   Total discharge time: Greater than 30 minutes Greater than 50% was spent in counseling and coordination of care.    Discharge time involved coordination of the discharge process with social worker, nursing staff and therapy department. Medical justification for home health services/DME verified.    Durenda Age, NP [facility] [phone] (Monday-Friday 8:00 a.m. - 5:00 p.m.) [phone] (after hours)
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
## 2018-01-05 ENCOUNTER — Encounter: Payer: Self-pay | Admitting: Adult Health

## 2018-01-05 MED ORDER — AMLODIPINE BESYLATE 5 MG PO TABS
5.0000 mg | ORAL_TABLET | Freq: Every day | ORAL | 0 refills | Status: DC
Start: 2018-01-04 — End: 2018-03-07

## 2018-01-05 MED ORDER — TAMSULOSIN HCL 0.4 MG PO CAPS: 0.4000 mg | ORAL_CAPSULE | Freq: Every day | ORAL | 0 refills | Status: AC

## 2018-01-05 MED ORDER — HYDROCODONE-ACETAMINOPHEN 5-325 MG PO TABS: 1.0000 | ORAL_TABLET | Freq: Four times a day (QID) | ORAL | 0 refills | Status: AC | PRN

## 2018-01-05 MED ORDER — INSULIN PEN NEEDLE 32G X 4 MM MISC: 1.0000 | Freq: Four times a day (QID) | 0 refills | Status: DC

## 2018-01-05 MED ORDER — ISOSORBIDE MONONITRATE ER 30 MG PO TB24: 30.0000 mg | ORAL_TABLET | Freq: Every day | ORAL | 0 refills | Status: DC

## 2018-01-05 MED ORDER — DOXYCYCLINE HYCLATE 100 MG PO TABS: 100.0000 mg | ORAL_TABLET | Freq: Two times a day (BID) | ORAL | 0 refills | Status: AC

## 2018-01-05 MED ORDER — SERTRALINE HCL 25 MG PO TABS: 25.0000 mg | ORAL_TABLET | Freq: Every day | ORAL | 0 refills | Status: DC

## 2018-01-05 MED ORDER — SACCHAROMYCES BOULARDII 250 MG PO CAPS: 250.0000 mg | ORAL_CAPSULE | Freq: Two times a day (BID) | ORAL | 0 refills | Status: AC

## 2018-01-05 MED ORDER — INSULIN LISPRO 100 UNIT/ML (KWIKPEN): PEN_INJECTOR | SUBCUTANEOUS | 0 refills | Status: DC

## 2018-01-05 MED ORDER — BASAGLAR KWIKPEN 100 UNIT/ML ~~LOC~~ SOPN: 35.0000 [IU] | PEN_INJECTOR | Freq: Two times a day (BID) | SUBCUTANEOUS | 0 refills | Status: DC

## 2018-01-05 MED ORDER — ONETOUCH ULTRASOFT LANCETS MISC: 0 refills | Status: DC

## 2018-01-05 MED ORDER — GLUCOSE BLOOD VI STRP: ORAL_STRIP | 0 refills | Status: DC

## 2018-01-05 MED ORDER — ROSUVASTATIN CALCIUM 20 MG PO TABS: 20.0000 mg | ORAL_TABLET | Freq: Every day | ORAL | 0 refills | Status: DC

## 2018-01-05 NOTE — Progress Notes (Signed)
  This encounter was created in error - please disregard.

## 2018-01-09 DIAGNOSIS — Z9181 History of falling: Secondary | ICD-10-CM | POA: Diagnosis not present

## 2018-01-09 DIAGNOSIS — I5022 Chronic systolic (congestive) heart failure: Secondary | ICD-10-CM | POA: Diagnosis not present

## 2018-01-09 DIAGNOSIS — E1140 Type 2 diabetes mellitus with diabetic neuropathy, unspecified: Secondary | ICD-10-CM | POA: Diagnosis not present

## 2018-01-09 DIAGNOSIS — I2510 Atherosclerotic heart disease of native coronary artery without angina pectoris: Secondary | ICD-10-CM | POA: Diagnosis not present

## 2018-01-09 DIAGNOSIS — R262 Difficulty in walking, not elsewhere classified: Secondary | ICD-10-CM | POA: Diagnosis not present

## 2018-01-09 DIAGNOSIS — N183 Chronic kidney disease, stage 3 (moderate): Secondary | ICD-10-CM | POA: Diagnosis not present

## 2018-01-10 ENCOUNTER — Telehealth: Payer: Self-pay

## 2018-01-10 DIAGNOSIS — R262 Difficulty in walking, not elsewhere classified: Secondary | ICD-10-CM | POA: Diagnosis not present

## 2018-01-10 DIAGNOSIS — I5022 Chronic systolic (congestive) heart failure: Secondary | ICD-10-CM | POA: Diagnosis not present

## 2018-01-10 DIAGNOSIS — N183 Chronic kidney disease, stage 3 (moderate): Secondary | ICD-10-CM | POA: Diagnosis not present

## 2018-01-10 DIAGNOSIS — E1140 Type 2 diabetes mellitus with diabetic neuropathy, unspecified: Secondary | ICD-10-CM | POA: Diagnosis not present

## 2018-01-10 DIAGNOSIS — I2510 Atherosclerotic heart disease of native coronary artery without angina pectoris: Secondary | ICD-10-CM | POA: Diagnosis not present

## 2018-01-10 DIAGNOSIS — Z9181 History of falling: Secondary | ICD-10-CM | POA: Diagnosis not present

## 2018-01-10 NOTE — Telephone Encounter (Signed)
Copied from [place] [phone] [phone]. Topic: Quick Communication - See Telephone Encounter >> Jan 10, 2018  2:14 PM Hewitt Shorts wrote: CRM for notification. See Telephone encounter [id number] fields from [facility] is calling to get verbal orders for home health pt and transfer training, gait traing, and lower extremities Strengthening 2 times a week for 6 weeks   Best number  for [place]anna Sat is [phone] 01/10/18.

## 2018-01-10 NOTE — Telephone Encounter (Signed)
Left message on Jenni's vm informing her Dr. Darnell Level agrees with [facility] orders for pt.

## 2018-01-10 NOTE — Telephone Encounter (Signed)
Agree with this. Thanks.

## 2018-01-11 ENCOUNTER — Telehealth: Payer: Self-pay | Admitting: Family Medicine

## 2018-01-11 ENCOUNTER — Inpatient Hospital Stay: Payer: Self-pay | Admitting: Family Medicine

## 2018-01-11 DIAGNOSIS — N183 Chronic kidney disease, stage 3 (moderate): Secondary | ICD-10-CM | POA: Diagnosis not present

## 2018-01-11 DIAGNOSIS — I5022 Chronic systolic (congestive) heart failure: Secondary | ICD-10-CM | POA: Diagnosis not present

## 2018-01-11 DIAGNOSIS — R262 Difficulty in walking, not elsewhere classified: Secondary | ICD-10-CM | POA: Diagnosis not present

## 2018-01-11 DIAGNOSIS — E1140 Type 2 diabetes mellitus with diabetic neuropathy, unspecified: Secondary | ICD-10-CM | POA: Diagnosis not present

## 2018-01-11 DIAGNOSIS — Z9181 History of falling: Secondary | ICD-10-CM | POA: Diagnosis not present

## 2018-01-11 DIAGNOSIS — I2510 Atherosclerotic heart disease of native coronary artery without angina pectoris: Secondary | ICD-10-CM | POA: Diagnosis not present

## 2018-01-11 NOTE — Telephone Encounter (Signed)
Copied from [place]. Topic: Quick Communication - See Telephone Encounter >> Jan 11, 2018  2:59 PM Oneta Rack wrote: CRM for notification. See Telephone encounter for:   01/11/18.  Caller name: Angie  Relation to pt: RN from [facility]  Call back number: [phone]    Reason for call:  Requesting verbal orders for home health nurse 2x 3

## 2018-01-11 NOTE — Telephone Encounter (Signed)
Copied from [place] [id number]. Topic: General - Other >> Jan 11, 2018  1:38 PM Lolita Rieger, [place] wrote: Reason for CRM: Tarren from [facility] called needing verbal order for OT 1 week 1 and 2 week 2 with a recert please call [phone]

## 2018-01-12 DIAGNOSIS — I5022 Chronic systolic (congestive) heart failure: Secondary | ICD-10-CM | POA: Diagnosis not present

## 2018-01-12 DIAGNOSIS — N183 Chronic kidney disease, stage 3 (moderate): Secondary | ICD-10-CM | POA: Diagnosis not present

## 2018-01-12 DIAGNOSIS — R262 Difficulty in walking, not elsewhere classified: Secondary | ICD-10-CM | POA: Diagnosis not present

## 2018-01-12 DIAGNOSIS — E1140 Type 2 diabetes mellitus with diabetic neuropathy, unspecified: Secondary | ICD-10-CM | POA: Diagnosis not present

## 2018-01-12 DIAGNOSIS — I2510 Atherosclerotic heart disease of native coronary artery without angina pectoris: Secondary | ICD-10-CM | POA: Diagnosis not present

## 2018-01-12 DIAGNOSIS — Z9181 History of falling: Secondary | ICD-10-CM | POA: Diagnosis not present

## 2018-01-12 NOTE — Telephone Encounter (Signed)
Left message on Angie's vm notifying her Dr. Danise Mina agrees with [facility] order request.

## 2018-01-12 NOTE — Telephone Encounter (Signed)
Agree with this. Thanks.

## 2018-01-15 ENCOUNTER — Telehealth: Payer: Self-pay | Admitting: Family Medicine

## 2018-01-15 DIAGNOSIS — N183 Chronic kidney disease, stage 3 (moderate): Secondary | ICD-10-CM | POA: Diagnosis not present

## 2018-01-15 DIAGNOSIS — R262 Difficulty in walking, not elsewhere classified: Secondary | ICD-10-CM | POA: Diagnosis not present

## 2018-01-15 DIAGNOSIS — Z9181 History of falling: Secondary | ICD-10-CM | POA: Diagnosis not present

## 2018-01-15 DIAGNOSIS — I2510 Atherosclerotic heart disease of native coronary artery without angina pectoris: Secondary | ICD-10-CM | POA: Diagnosis not present

## 2018-01-15 DIAGNOSIS — I5022 Chronic systolic (congestive) heart failure: Secondary | ICD-10-CM | POA: Diagnosis not present

## 2018-01-15 DIAGNOSIS — E1140 Type 2 diabetes mellitus with diabetic neuropathy, unspecified: Secondary | ICD-10-CM | POA: Diagnosis not present

## 2018-01-15 NOTE — Telephone Encounter (Signed)
Agree with this. Thanks.  I never received original message.

## 2018-01-15 NOTE — Telephone Encounter (Signed)
Shireen with [facility] calling to follow up on the OT orders. Shireen states they need asap, or pt will have a missed visit.  Shireen states she called on 2/14, Thursday, and new it was not like you not to get back. (I do not see a message for OT)  Phone [phone]  [facility] to leave message OT 1 wk / 1 2 wk / 2

## 2018-01-15 NOTE — Telephone Encounter (Signed)
Copied from [place] [phone]. Topic: General - Other >> Jan 11, 2018  1:38 PM Lolita Rieger, [place] wrote: Reason for CRM: Tarren from [place] called needing verbal order for OT 1 week 1 and 2 week 2 with a recert please call [phone]

## 2018-01-15 NOTE — Telephone Encounter (Signed)
Spoke with Frank Simpson relaying message per Dr. Darnell Level. She says ok and thank you.

## 2018-01-16 DIAGNOSIS — Z9181 History of falling: Secondary | ICD-10-CM | POA: Diagnosis not present

## 2018-01-16 DIAGNOSIS — I2510 Atherosclerotic heart disease of native coronary artery without angina pectoris: Secondary | ICD-10-CM | POA: Diagnosis not present

## 2018-01-16 DIAGNOSIS — R262 Difficulty in walking, not elsewhere classified: Secondary | ICD-10-CM | POA: Diagnosis not present

## 2018-01-16 DIAGNOSIS — I5022 Chronic systolic (congestive) heart failure: Secondary | ICD-10-CM | POA: Diagnosis not present

## 2018-01-16 DIAGNOSIS — N183 Chronic kidney disease, stage 3 (moderate): Secondary | ICD-10-CM | POA: Diagnosis not present

## 2018-01-16 DIAGNOSIS — E1140 Type 2 diabetes mellitus with diabetic neuropathy, unspecified: Secondary | ICD-10-CM | POA: Diagnosis not present

## 2018-01-18 ENCOUNTER — Encounter (INDEPENDENT_AMBULATORY_CARE_PROVIDER_SITE_OTHER): Payer: Self-pay | Admitting: Orthopedic Surgery

## 2018-01-18 ENCOUNTER — Ambulatory Visit (INDEPENDENT_AMBULATORY_CARE_PROVIDER_SITE_OTHER): Payer: Medicare Other

## 2018-01-18 ENCOUNTER — Ambulatory Visit (INDEPENDENT_AMBULATORY_CARE_PROVIDER_SITE_OTHER): Payer: Medicare Other | Admitting: Orthopedic Surgery

## 2018-01-18 VITALS — Ht 72.0 in | Wt 235.0 lb

## 2018-01-18 DIAGNOSIS — M79644 Pain in right finger(s): Secondary | ICD-10-CM

## 2018-01-18 DIAGNOSIS — Z9181 History of falling: Secondary | ICD-10-CM | POA: Diagnosis not present

## 2018-01-18 DIAGNOSIS — I2510 Atherosclerotic heart disease of native coronary artery without angina pectoris: Secondary | ICD-10-CM | POA: Diagnosis not present

## 2018-01-18 DIAGNOSIS — M25551 Pain in right hip: Secondary | ICD-10-CM | POA: Diagnosis not present

## 2018-01-18 DIAGNOSIS — I5022 Chronic systolic (congestive) heart failure: Secondary | ICD-10-CM | POA: Diagnosis not present

## 2018-01-18 DIAGNOSIS — S72141A Displaced intertrochanteric fracture of right femur, initial encounter for closed fracture: Secondary | ICD-10-CM

## 2018-01-18 DIAGNOSIS — R262 Difficulty in walking, not elsewhere classified: Secondary | ICD-10-CM | POA: Diagnosis not present

## 2018-01-18 DIAGNOSIS — N183 Chronic kidney disease, stage 3 (moderate): Secondary | ICD-10-CM | POA: Diagnosis not present

## 2018-01-18 DIAGNOSIS — E1140 Type 2 diabetes mellitus with diabetic neuropathy, unspecified: Secondary | ICD-10-CM | POA: Diagnosis not present
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
## 2018-01-19 ENCOUNTER — Telehealth (INDEPENDENT_AMBULATORY_CARE_PROVIDER_SITE_OTHER): Payer: Self-pay

## 2018-01-19 DIAGNOSIS — R262 Difficulty in walking, not elsewhere classified: Secondary | ICD-10-CM | POA: Diagnosis not present

## 2018-01-19 DIAGNOSIS — E1140 Type 2 diabetes mellitus with diabetic neuropathy, unspecified: Secondary | ICD-10-CM | POA: Diagnosis not present

## 2018-01-19 DIAGNOSIS — I5022 Chronic systolic (congestive) heart failure: Secondary | ICD-10-CM | POA: Diagnosis not present

## 2018-01-19 DIAGNOSIS — Z9181 History of falling: Secondary | ICD-10-CM | POA: Diagnosis not present

## 2018-01-19 DIAGNOSIS — N183 Chronic kidney disease, stage 3 (moderate): Secondary | ICD-10-CM | POA: Diagnosis not present

## 2018-01-19 DIAGNOSIS — I2510 Atherosclerotic heart disease of native coronary artery without angina pectoris: Secondary | ICD-10-CM | POA: Diagnosis not present

## 2018-01-19 NOTE — Telephone Encounter (Signed)
-----  Message from Suzan Slick, NP sent at 01/18/2018  1:17 PM EST ----- Will you call [facility] and update pt orders  eval and treat, is wbat on right hip

## 2018-01-19 NOTE — Telephone Encounter (Signed)
Call for updated orders.

## 2018-01-19 NOTE — Telephone Encounter (Signed)
Called [facility] cb # [phone]. Called this morning to give updated PT orders and clinical staff in meeting will call again later.

## 2018-01-19 NOTE — Telephone Encounter (Signed)
Tried calling line busy will hold message and try again.

## 2018-01-22 DIAGNOSIS — N183 Chronic kidney disease, stage 3 (moderate): Secondary | ICD-10-CM | POA: Diagnosis not present

## 2018-01-22 DIAGNOSIS — R262 Difficulty in walking, not elsewhere classified: Secondary | ICD-10-CM | POA: Diagnosis not present

## 2018-01-22 DIAGNOSIS — Z9181 History of falling: Secondary | ICD-10-CM | POA: Diagnosis not present

## 2018-01-22 DIAGNOSIS — E1140 Type 2 diabetes mellitus with diabetic neuropathy, unspecified: Secondary | ICD-10-CM | POA: Diagnosis not present

## 2018-01-22 DIAGNOSIS — I5022 Chronic systolic (congestive) heart failure: Secondary | ICD-10-CM | POA: Diagnosis not present

## 2018-01-22 DIAGNOSIS — I2510 Atherosclerotic heart disease of native coronary artery without angina pectoris: Secondary | ICD-10-CM | POA: Diagnosis not present

## 2018-01-23 ENCOUNTER — Institutional Professional Consult (permissible substitution): Payer: Medicare Other | Admitting: Neurology

## 2018-01-23 NOTE — Telephone Encounter (Signed)
Called and gave verbal order to care team will call with questions.

## 2018-01-24 ENCOUNTER — Telehealth: Payer: Self-pay | Admitting: Family Medicine

## 2018-01-24 DIAGNOSIS — I5022 Chronic systolic (congestive) heart failure: Secondary | ICD-10-CM | POA: Diagnosis not present

## 2018-01-24 DIAGNOSIS — R269 Unspecified abnormalities of gait and mobility: Secondary | ICD-10-CM

## 2018-01-24 DIAGNOSIS — I2510 Atherosclerotic heart disease of native coronary artery without angina pectoris: Secondary | ICD-10-CM | POA: Diagnosis not present

## 2018-01-24 DIAGNOSIS — R262 Difficulty in walking, not elsewhere classified: Secondary | ICD-10-CM | POA: Diagnosis not present

## 2018-01-24 DIAGNOSIS — Z9181 History of falling: Secondary | ICD-10-CM | POA: Diagnosis not present

## 2018-01-24 DIAGNOSIS — N183 Chronic kidney disease, stage 3 (moderate): Secondary | ICD-10-CM | POA: Diagnosis not present

## 2018-01-24 DIAGNOSIS — E1140 Type 2 diabetes mellitus with diabetic neuropathy, unspecified: Secondary | ICD-10-CM | POA: Diagnosis not present

## 2018-01-24 NOTE — Telephone Encounter (Signed)
Copied from [place]. Topic: Inquiry >> Jan 24, 2018  4:07 PM Conception Chancy, NT wrote: Margo Common is a occupational therapist calling from [facility]. She states the original order for 02/16/01 is for a re certification but it will be changed to a discharge. Patient is declining ADL training states his wife is doing it.   [phone]

## 2018-01-25 ENCOUNTER — Ambulatory Visit (INDEPENDENT_AMBULATORY_CARE_PROVIDER_SITE_OTHER): Payer: Medicare Other | Admitting: *Deleted

## 2018-01-25 DIAGNOSIS — I5022 Chronic systolic (congestive) heart failure: Secondary | ICD-10-CM | POA: Diagnosis not present

## 2018-01-25 DIAGNOSIS — I442 Atrioventricular block, complete: Secondary | ICD-10-CM | POA: Diagnosis not present

## 2018-01-25 DIAGNOSIS — Z9181 History of falling: Secondary | ICD-10-CM | POA: Diagnosis not present

## 2018-01-25 DIAGNOSIS — E1140 Type 2 diabetes mellitus with diabetic neuropathy, unspecified: Secondary | ICD-10-CM | POA: Diagnosis not present

## 2018-01-25 DIAGNOSIS — R262 Difficulty in walking, not elsewhere classified: Secondary | ICD-10-CM | POA: Diagnosis not present

## 2018-01-25 DIAGNOSIS — N183 Chronic kidney disease, stage 3 (moderate): Secondary | ICD-10-CM | POA: Diagnosis not present

## 2018-01-25 DIAGNOSIS — I2510 Atherosclerotic heart disease of native coronary artery without angina pectoris: Secondary | ICD-10-CM | POA: Diagnosis not present

## 2018-01-25 NOTE — Progress Notes (Signed)
Remote pacemaker transmission.

## 2018-01-26 ENCOUNTER — Encounter: Payer: Self-pay | Admitting: Cardiology

## 2018-01-26 DIAGNOSIS — E1140 Type 2 diabetes mellitus with diabetic neuropathy, unspecified: Secondary | ICD-10-CM | POA: Diagnosis not present

## 2018-01-26 DIAGNOSIS — Z9181 History of falling: Secondary | ICD-10-CM | POA: Diagnosis not present

## 2018-01-26 DIAGNOSIS — R262 Difficulty in walking, not elsewhere classified: Secondary | ICD-10-CM | POA: Diagnosis not present

## 2018-01-26 DIAGNOSIS — I5022 Chronic systolic (congestive) heart failure: Secondary | ICD-10-CM | POA: Diagnosis not present

## 2018-01-26 DIAGNOSIS — N183 Chronic kidney disease, stage 3 (moderate): Secondary | ICD-10-CM | POA: Diagnosis not present

## 2018-01-26 DIAGNOSIS — I2510 Atherosclerotic heart disease of native coronary artery without angina pectoris: Secondary | ICD-10-CM | POA: Diagnosis not present

## 2018-01-28 NOTE — Telephone Encounter (Signed)
Noted.

## 2018-01-29 ENCOUNTER — Telehealth: Payer: Self-pay | Admitting: Family Medicine

## 2018-01-29 DIAGNOSIS — I5022 Chronic systolic (congestive) heart failure: Secondary | ICD-10-CM | POA: Diagnosis not present

## 2018-01-29 DIAGNOSIS — E11319 Type 2 diabetes mellitus with unspecified diabetic retinopathy without macular edema: Secondary | ICD-10-CM | POA: Diagnosis not present

## 2018-01-29 DIAGNOSIS — E1140 Type 2 diabetes mellitus with diabetic neuropathy, unspecified: Secondary | ICD-10-CM | POA: Diagnosis not present

## 2018-01-29 DIAGNOSIS — Z9181 History of falling: Secondary | ICD-10-CM | POA: Diagnosis not present

## 2018-01-29 DIAGNOSIS — Z950 Presence of cardiac pacemaker: Secondary | ICD-10-CM | POA: Diagnosis not present

## 2018-01-29 DIAGNOSIS — Z7982 Long term (current) use of aspirin: Secondary | ICD-10-CM | POA: Diagnosis not present

## 2018-01-29 DIAGNOSIS — E1122 Type 2 diabetes mellitus with diabetic chronic kidney disease: Secondary | ICD-10-CM | POA: Diagnosis not present

## 2018-01-29 DIAGNOSIS — Z794 Long term (current) use of insulin: Secondary | ICD-10-CM | POA: Diagnosis not present

## 2018-01-29 DIAGNOSIS — R296 Repeated falls: Secondary | ICD-10-CM | POA: Diagnosis not present

## 2018-01-29 DIAGNOSIS — Z96651 Presence of right artificial knee joint: Secondary | ICD-10-CM | POA: Diagnosis not present

## 2018-01-29 DIAGNOSIS — N183 Chronic kidney disease, stage 3 (moderate): Secondary | ICD-10-CM | POA: Diagnosis not present

## 2018-01-29 DIAGNOSIS — S72001D Fracture of unspecified part of neck of right femur, subsequent encounter for closed fracture with routine healing: Secondary | ICD-10-CM | POA: Diagnosis not present

## 2018-01-29 DIAGNOSIS — I2510 Atherosclerotic heart disease of native coronary artery without angina pectoris: Secondary | ICD-10-CM | POA: Diagnosis not present

## 2018-01-29 NOTE — Telephone Encounter (Signed)
Agree with this. Thanks.

## 2018-01-29 NOTE — Telephone Encounter (Signed)
Copied from [place]. Topic: General - Other >> Jan 29, 2018  3:25 PM Conception Chancy, NT wrote: [facility] is calling from [facility] and is requesting verbal orders for continuing physical therapy for 2x a week for 6 weeks. Beginning 01/28/18.   Cb# [phone]

## 2018-01-30 NOTE — Telephone Encounter (Signed)
Spoke with Sonia Baller informing her Dr. Darnell Level agrees with PT order.

## 2018-02-02 DIAGNOSIS — R296 Repeated falls: Secondary | ICD-10-CM | POA: Diagnosis not present

## 2018-02-02 DIAGNOSIS — I2510 Atherosclerotic heart disease of native coronary artery without angina pectoris: Secondary | ICD-10-CM | POA: Diagnosis not present

## 2018-02-02 DIAGNOSIS — I5022 Chronic systolic (congestive) heart failure: Secondary | ICD-10-CM | POA: Diagnosis not present

## 2018-02-02 DIAGNOSIS — N183 Chronic kidney disease, stage 3 (moderate): Secondary | ICD-10-CM | POA: Diagnosis not present

## 2018-02-02 DIAGNOSIS — S72001D Fracture of unspecified part of neck of right femur, subsequent encounter for closed fracture with routine healing: Secondary | ICD-10-CM | POA: Diagnosis not present

## 2018-02-02 DIAGNOSIS — E1122 Type 2 diabetes mellitus with diabetic chronic kidney disease: Secondary | ICD-10-CM | POA: Diagnosis not present

## 2018-02-05 ENCOUNTER — Telehealth: Payer: Self-pay | Admitting: Family Medicine

## 2018-02-05 DIAGNOSIS — S72141A Displaced intertrochanteric fracture of right femur, initial encounter for closed fracture: Secondary | ICD-10-CM

## 2018-02-05 DIAGNOSIS — Z950 Presence of cardiac pacemaker: Secondary | ICD-10-CM

## 2018-02-05 DIAGNOSIS — E1140 Type 2 diabetes mellitus with diabetic neuropathy, unspecified: Secondary | ICD-10-CM | POA: Diagnosis not present

## 2018-02-05 DIAGNOSIS — Z794 Long term (current) use of insulin: Secondary | ICD-10-CM | POA: Diagnosis not present

## 2018-02-05 DIAGNOSIS — F0390 Unspecified dementia without behavioral disturbance: Secondary | ICD-10-CM

## 2018-02-05 DIAGNOSIS — Z7982 Long term (current) use of aspirin: Secondary | ICD-10-CM | POA: Diagnosis not present

## 2018-02-05 DIAGNOSIS — N183 Chronic kidney disease, stage 3 (moderate): Secondary | ICD-10-CM | POA: Diagnosis not present

## 2018-02-05 DIAGNOSIS — S72001D Fracture of unspecified part of neck of right femur, subsequent encounter for closed fracture with routine healing: Secondary | ICD-10-CM | POA: Diagnosis not present

## 2018-02-05 DIAGNOSIS — R296 Repeated falls: Secondary | ICD-10-CM | POA: Diagnosis not present

## 2018-02-05 DIAGNOSIS — Z9181 History of falling: Secondary | ICD-10-CM | POA: Diagnosis not present

## 2018-02-05 DIAGNOSIS — Z96651 Presence of right artificial knee joint: Secondary | ICD-10-CM | POA: Diagnosis not present

## 2018-02-05 DIAGNOSIS — E11319 Type 2 diabetes mellitus with unspecified diabetic retinopathy without macular edema: Secondary | ICD-10-CM | POA: Diagnosis not present

## 2018-02-05 DIAGNOSIS — I2510 Atherosclerotic heart disease of native coronary artery without angina pectoris: Secondary | ICD-10-CM | POA: Diagnosis not present

## 2018-02-05 DIAGNOSIS — I5022 Chronic systolic (congestive) heart failure: Secondary | ICD-10-CM | POA: Diagnosis not present

## 2018-02-05 DIAGNOSIS — E1122 Type 2 diabetes mellitus with diabetic chronic kidney disease: Secondary | ICD-10-CM | POA: Diagnosis not present

## 2018-02-05 MED ORDER — MELATONIN 5 MG PO TABS: 1.0000 | ORAL_TABLET | Freq: Every evening | ORAL | 0 refills | Status: DC | PRN

## 2018-02-05 NOTE — Telephone Encounter (Signed)
Recommend they try melatonin 3-40m at bedtime.  Limited in options otherwise for sleep aides.

## 2018-02-05 NOTE — Telephone Encounter (Signed)
Copied from [place]. Topic: General - Other >> Feb 05, 2018 10:06 AM Darl Householder, RMA wrote: Reason for CRM: Patient's wife Pamala Hurry is calling requesting a prescription to help pt sleep at night, please return pt call

## 2018-02-05 NOTE — Telephone Encounter (Signed)
I spoke with pts wife (DPR signed); pt usually can go to sleep but wakes up and then cannot go back to sleep until  Around 4:30 AM.This has been going on since returned home from nursing home. Pt last seen 11/08/17. Mrs Corbit request med for rest to [facility].

## 2018-02-05 NOTE — Telephone Encounter (Signed)
Spoke with pt's wife, Pamala Hurry (on dpr), relaying message per Dr. Darnell Level.  States pt has a broken hip and thinks that's why he's not sleeping at night but says he sleeps during the day. She was asking if he could have some pain meds to help him sleep.

## 2018-02-07 ENCOUNTER — Emergency Department (HOSPITAL_COMMUNITY): Payer: Medicare Other

## 2018-02-07 ENCOUNTER — Encounter (HOSPITAL_COMMUNITY): Payer: Self-pay | Admitting: Emergency Medicine

## 2018-02-07 ENCOUNTER — Other Ambulatory Visit: Payer: Self-pay

## 2018-02-07 ENCOUNTER — Ambulatory Visit: Payer: Self-pay | Admitting: *Deleted

## 2018-02-07 ENCOUNTER — Inpatient Hospital Stay (HOSPITAL_COMMUNITY)
Admission: EM | Admit: 2018-02-07 | Discharge: 2018-02-13 | DRG: 291 | Disposition: A | Discharge status: Home Health | Payer: Medicare Other | Attending: Family Medicine | Admitting: Family Medicine

## 2018-02-07 DIAGNOSIS — I959 Hypotension, unspecified: Secondary | ICD-10-CM | POA: Diagnosis present

## 2018-02-07 DIAGNOSIS — Z833 Family history of diabetes mellitus: Secondary | ICD-10-CM

## 2018-02-07 DIAGNOSIS — I5041 Acute combined systolic (congestive) and diastolic (congestive) heart failure: Secondary | ICD-10-CM | POA: Diagnosis not present

## 2018-02-07 DIAGNOSIS — M1990 Unspecified osteoarthritis, unspecified site: Secondary | ICD-10-CM | POA: Diagnosis present

## 2018-02-07 DIAGNOSIS — E11319 Type 2 diabetes mellitus with unspecified diabetic retinopathy without macular edema: Secondary | ICD-10-CM | POA: Diagnosis present

## 2018-02-07 DIAGNOSIS — R000 Tachycardia, unspecified: Secondary | ICD-10-CM | POA: Diagnosis present

## 2018-02-07 DIAGNOSIS — I2510 Atherosclerotic heart disease of native coronary artery without angina pectoris: Secondary | ICD-10-CM | POA: Diagnosis present

## 2018-02-07 DIAGNOSIS — Z85820 Personal history of malignant melanoma of skin: Secondary | ICD-10-CM | POA: Diagnosis not present

## 2018-02-07 DIAGNOSIS — N179 Acute kidney failure, unspecified: Secondary | ICD-10-CM | POA: Diagnosis not present

## 2018-02-07 DIAGNOSIS — I5022 Chronic systolic (congestive) heart failure: Secondary | ICD-10-CM | POA: Diagnosis not present

## 2018-02-07 DIAGNOSIS — E1122 Type 2 diabetes mellitus with diabetic chronic kidney disease: Secondary | ICD-10-CM | POA: Diagnosis present

## 2018-02-07 DIAGNOSIS — I442 Atrioventricular block, complete: Secondary | ICD-10-CM | POA: Diagnosis present

## 2018-02-07 DIAGNOSIS — Z794 Long term (current) use of insulin: Secondary | ICD-10-CM | POA: Diagnosis not present

## 2018-02-07 DIAGNOSIS — Z79899 Other long term (current) drug therapy: Secondary | ICD-10-CM

## 2018-02-07 DIAGNOSIS — J9601 Acute respiratory failure with hypoxia: Secondary | ICD-10-CM | POA: Diagnosis present

## 2018-02-07 DIAGNOSIS — I509 Heart failure, unspecified: Secondary | ICD-10-CM

## 2018-02-07 DIAGNOSIS — R7989 Other specified abnormal findings of blood chemistry: Secondary | ICD-10-CM | POA: Diagnosis present

## 2018-02-07 DIAGNOSIS — I351 Nonrheumatic aortic (valve) insufficiency: Secondary | ICD-10-CM | POA: Diagnosis not present

## 2018-02-07 DIAGNOSIS — E785 Hyperlipidemia, unspecified: Secondary | ICD-10-CM | POA: Diagnosis present

## 2018-02-07 DIAGNOSIS — S72001D Fracture of unspecified part of neck of right femur, subsequent encounter for closed fracture with routine healing: Secondary | ICD-10-CM | POA: Diagnosis not present

## 2018-02-07 DIAGNOSIS — Z888 Allergy status to other drugs, medicaments and biological substances status: Secondary | ICD-10-CM

## 2018-02-07 DIAGNOSIS — I5042 Chronic combined systolic (congestive) and diastolic (congestive) heart failure: Secondary | ICD-10-CM

## 2018-02-07 DIAGNOSIS — I130 Hypertensive heart and chronic kidney disease with heart failure and stage 1 through stage 4 chronic kidney disease, or unspecified chronic kidney disease: Secondary | ICD-10-CM | POA: Diagnosis not present

## 2018-02-07 DIAGNOSIS — R0602 Shortness of breath: Secondary | ICD-10-CM | POA: Diagnosis not present

## 2018-02-07 DIAGNOSIS — Z955 Presence of coronary angioplasty implant and graft: Secondary | ICD-10-CM | POA: Diagnosis not present

## 2018-02-07 DIAGNOSIS — E1121 Type 2 diabetes mellitus with diabetic nephropathy: Secondary | ICD-10-CM | POA: Diagnosis not present

## 2018-02-07 DIAGNOSIS — G9341 Metabolic encephalopathy: Secondary | ICD-10-CM | POA: Diagnosis present

## 2018-02-07 DIAGNOSIS — Z885 Allergy status to narcotic agent status: Secondary | ICD-10-CM

## 2018-02-07 DIAGNOSIS — I5043 Acute on chronic combined systolic (congestive) and diastolic (congestive) heart failure: Secondary | ICD-10-CM | POA: Diagnosis present

## 2018-02-07 DIAGNOSIS — Z950 Presence of cardiac pacemaker: Secondary | ICD-10-CM

## 2018-02-07 DIAGNOSIS — N189 Chronic kidney disease, unspecified: Secondary | ICD-10-CM | POA: Diagnosis not present

## 2018-02-07 DIAGNOSIS — N183 Chronic kidney disease, stage 3 (moderate): Secondary | ICD-10-CM | POA: Diagnosis not present

## 2018-02-07 DIAGNOSIS — Z8673 Personal history of transient ischemic attack (TIA), and cerebral infarction without residual deficits: Secondary | ICD-10-CM | POA: Diagnosis not present

## 2018-02-07 DIAGNOSIS — I5021 Acute systolic (congestive) heart failure: Secondary | ICD-10-CM | POA: Diagnosis not present

## 2018-02-07 DIAGNOSIS — Z880 Allergy status to penicillin: Secondary | ICD-10-CM | POA: Diagnosis not present

## 2018-02-07 DIAGNOSIS — J810 Acute pulmonary edema: Secondary | ICD-10-CM | POA: Diagnosis not present

## 2018-02-07 DIAGNOSIS — I340 Nonrheumatic mitral (valve) insufficiency: Secondary | ICD-10-CM | POA: Diagnosis not present

## 2018-02-07 DIAGNOSIS — E1159 Type 2 diabetes mellitus with other circulatory complications: Secondary | ICD-10-CM | POA: Diagnosis not present

## 2018-02-07 DIAGNOSIS — R778 Other specified abnormalities of plasma proteins: Secondary | ICD-10-CM | POA: Diagnosis present

## 2018-02-07 DIAGNOSIS — Z96651 Presence of right artificial knee joint: Secondary | ICD-10-CM | POA: Diagnosis present

## 2018-02-07 DIAGNOSIS — R748 Abnormal levels of other serum enzymes: Secondary | ICD-10-CM

## 2018-02-07 DIAGNOSIS — R079 Chest pain, unspecified: Secondary | ICD-10-CM | POA: Diagnosis not present

## 2018-02-07 DIAGNOSIS — R296 Repeated falls: Secondary | ICD-10-CM | POA: Diagnosis not present

## 2018-02-07 LAB — BASIC METABOLIC PANEL
ANION GAP: 11 (ref 5–15)
BUN: 29 mg/dL — ABNORMAL HIGH (ref 6–20)
CALCIUM: 9.1 mg/dL (ref 8.9–10.3)
CO2: 25 mmol/L (ref 22–32)
Chloride: 107 mmol/L (ref 101–111)
Creatinine, Ser: 1.66 mg/dL — ABNORMAL HIGH (ref 0.61–1.24)
GFR, EST AFRICAN AMERICAN: 42 mL/min — AB (ref >60)
GFR, EST NON AFRICAN AMERICAN: 36 mL/min — AB (ref >60)
Glucose, Bld: 137 mg/dL — ABNORMAL HIGH (ref 65–99)
Potassium: 4.1 mmol/L (ref 3.5–5.1)
Sodium: 143 mmol/L (ref 135–145)

## 2018-02-07 LAB — PROTIME-INR
INR: 1.1
Prothrombin Time: 14.1 seconds (ref 11.4–15.2)

## 2018-02-07 LAB — APTT: APTT: 27 s (ref 24–36)

## 2018-02-07 LAB — CBC
HCT: 44.6 % (ref 39.0–52.0)
Hemoglobin: 14.2 g/dL (ref 13.0–17.0)
MCH: 29.5 pg (ref 26.0–34.0)
MCHC: 31.8 g/dL (ref 30.0–36.0)
MCV: 92.7 fL (ref 78.0–100.0)
Platelets: 208 10*3/uL (ref 150–400)
RBC: 4.81 MIL/uL (ref 4.22–5.81)
RDW: 15.6 % — ABNORMAL HIGH (ref 11.5–15.5)
WBC: 8.8 10*3/uL (ref 4.0–10.5)

## 2018-02-07 LAB — BRAIN NATRIURETIC PEPTIDE: B NATRIURETIC PEPTIDE 5: 1054.6 pg/mL — AB (ref 0.0–100.0)

## 2018-02-07 LAB — I-STAT TROPONIN, ED: TROPONIN I, POC: 0.42 ng/mL — AB (ref 0.00–0.08)

## 2018-02-07 LAB — D-DIMER, QUANTITATIVE (NOT AT ARMC): D DIMER QUANT: 3.02 ug{FEU}/mL — AB (ref 0.00–0.50)

## 2018-02-07 MED ORDER — NITROGLYCERIN 2 % TD OINT
1.0000 [in_us] | TOPICAL_OINTMENT | Freq: Three times a day (TID) | TRANSDERMAL | Status: DC
  Administered 2018-02-08 (×3): 1 [in_us] via TOPICAL
  Filled 2018-02-07 (×3): qty 1

## 2018-02-07 MED ORDER — CARVEDILOL 3.125 MG PO TABS
3.1250 mg | ORAL_TABLET | Freq: Two times a day (BID) | ORAL | Status: DC
  Administered 2018-02-08 – 2018-02-09 (×2): 3.125 mg via ORAL
  Filled 2018-02-07 (×4): qty 1

## 2018-02-07 MED ORDER — ASPIRIN 81 MG PO CHEW
324.0000 mg | CHEWABLE_TABLET | Freq: Once | ORAL | Status: AC
  Administered 2018-02-07: 324 mg via ORAL
  Filled 2018-02-07: qty 4

## 2018-02-07 MED ORDER — HEPARIN (PORCINE) IN NACL 100-0.45 UNIT/ML-% IJ SOLN
1300.0000 [IU]/h | INTRAMUSCULAR | Status: DC
  Administered 2018-02-07: 1300 [IU]/h via INTRAVENOUS
  Filled 2018-02-07 (×2): qty 250

## 2018-02-07 MED ORDER — HYDRALAZINE HCL 25 MG PO TABS
25.0000 mg | ORAL_TABLET | Freq: Three times a day (TID) | ORAL | Status: DC
  Administered 2018-02-07 – 2018-02-13 (×13): 25 mg via ORAL
  Filled 2018-02-07 (×14): qty 1

## 2018-02-07 MED ORDER — FUROSEMIDE 10 MG/ML IJ SOLN
40.0000 mg | Freq: Once | INTRAMUSCULAR | Status: AC
  Administered 2018-02-07: 40 mg via INTRAVENOUS
  Filled 2018-02-07: qty 4

## 2018-02-07 MED ORDER — HYDRALAZINE HCL 20 MG/ML IJ SOLN: 5.0000 mg | Freq: Four times a day (QID) | INTRAMUSCULAR | Status: DC | PRN

## 2018-02-07 MED ORDER — HEPARIN BOLUS VIA INFUSION
4000.0000 [IU] | Freq: Once | INTRAVENOUS | Status: AC
  Administered 2018-02-07: 4000 [IU] via INTRAVENOUS
  Filled 2018-02-07: qty 4000
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
## 2018-02-07 NOTE — Telephone Encounter (Addendum)
What is he currently taking for pain? Does he have hydrocodone? Recommend we schedule tylenol 562m three times daily with meals to start.  Would avoid stronger pain medicines if we can due to high amount of side effects including constipation, increased fall risk, increased confusion and memory trouble to name a few.

## 2018-02-07 NOTE — Telephone Encounter (Signed)
Patient's wife phoned re [name]. She reports for the last 2 nights he has had audible wheezing and SOB at night, ONLY. She stated he had hip surgery in January and placed in rehab for a short time and was told he had aspiration pneumonia while there. She also reports he drools at any given time, not just with meals.Stated he sits up to sleep during the day since the wheezing has started occurring during the night. She denies him having any fever during this time. He does not use any nebs or inhalers. Advised her to seek treatment at the ER. She was given an appointment at PCP for tomorrow. I'm not sure she will seek ER treatment as she mentioned she would like to see PCP first.    Reason for Disposition . [1] MODERATE difficulty breathing (e.g., speaks in phrases, SOB even at rest, pulse 100-120) AND [2] NEW-onset or WORSE than normal  Answer Assessment - Initial Assessment Questions 1. RESPIRATORY STATUS: "Describe your breathing?" (e.g., wheezing, shortness of breath, unable to speak, severe coughing)      Wheezing, SOB only at night for the last 2 nights. 2. ONSET: "When did this breathing problem begin?"  2 nights ago, only at night 3. PATTERN "Does the difficult breathing come and go, or has it been constant since it started?"      Is at night only 4. SEVERITY: "How bad is your breathing?" (e.g., mild, moderate, severe)    - MILD: No SOB at rest, mild SOB with walking, speaks normally in sentences, can lay down, no retractions, pulse < 100.    - MODERATE: SOB at rest, SOB with minimal exertion and prefers to sit, cannot lie down flat, speaks in phrases, mild retractions, audible wheezing, pulse 100-120.    - SEVERE: Very SOB at rest, speaks in single words, struggling to breathe, sitting hunched forward, retractions, pulse > 120     severe 5. RECURRENT SYMPTOM: "Have you had difficulty breathing before?" If so, ask: "When was the last time?" and "What happened that time?"      no 6.  CARDIAC HISTORY: "Do you have any history of heart disease?" (e.g., heart attack, angina, bypass surgery, angioplasty)      Pacemaker and stint placement. 7. LUNG HISTORY: "Do you have any history of lung disease?"  (e.g., pulmonary embolus, asthma, emphysema)    no 8. CAUSE: "What do you think is causing the breathing problem?"  Maybe pneumonia about 1 months ago. 9. OTHER SYMPTOMS: "Do you have any other symptoms? (e.g., dizziness, runny nose, cough, chest pain, fever)    no 10. PREGNANCY: "Is there any chance you are pregnant?" "When was your last menstrual period?" na 11. TRAVEL: "Have you traveled out of the country in the last month?" (e.g., travel history, exposures)       na  Protocols used: BREATHING DIFFICULTY-A-AH
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
## 2018-02-07 NOTE — Telephone Encounter (Signed)
Noted.

## 2018-02-07 NOTE — ED Notes (Signed)
Bed: ZO10 Expected date:  Expected time:  Means of arrival:  Comments: [place]

## 2018-02-07 NOTE — ED Triage Notes (Signed)
Pt complaint of intermittent SOB and right chest pain over past 2 days; worse at night.

## 2018-02-07 NOTE — Telephone Encounter (Signed)
Spoke with pt's wife, Pamala Hurry, asking if pt is taking hydrocodone.  Says he was given Lorcet for his hip fx.  I relayed message per Dr. Darnell Level.  She verbalizes understanding.

## 2018-02-08 ENCOUNTER — Inpatient Hospital Stay (HOSPITAL_COMMUNITY): Payer: Medicare Other

## 2018-02-08 ENCOUNTER — Encounter (HOSPITAL_COMMUNITY): Payer: Self-pay | Admitting: Cardiology

## 2018-02-08 ENCOUNTER — Ambulatory Visit: Payer: Medicare Other | Admitting: Podiatry

## 2018-02-08 ENCOUNTER — Ambulatory Visit: Payer: Medicare Other | Admitting: Internal Medicine

## 2018-02-08 DIAGNOSIS — I5041 Acute combined systolic (congestive) and diastolic (congestive) heart failure: Secondary | ICD-10-CM

## 2018-02-08 DIAGNOSIS — N183 Chronic kidney disease, stage 3 (moderate): Secondary | ICD-10-CM

## 2018-02-08 LAB — CBC
HCT: 38.9 % — ABNORMAL LOW (ref 39.0–52.0)
HEMOGLOBIN: 12.3 g/dL — AB (ref 13.0–17.0)
MCH: 29 pg (ref 26.0–34.0)
MCHC: 31.6 g/dL (ref 30.0–36.0)
MCV: 91.7 fL (ref 78.0–100.0)
Platelets: 192 10*3/uL (ref 150–400)
RBC: 4.24 MIL/uL (ref 4.22–5.81)
RDW: 15.6 % — ABNORMAL HIGH (ref 11.5–15.5)
WBC: 8.1 10*3/uL (ref 4.0–10.5)

## 2018-02-08 LAB — TROPONIN I
TROPONIN I: 0.34 ng/mL — AB (ref <0.03)
Troponin I: 0.33 ng/mL (ref <0.03)
Troponin I: 0.31 ng/mL (ref <0.03)

## 2018-02-08 LAB — COMPREHENSIVE METABOLIC PANEL
ALT: 14 U/L — ABNORMAL LOW (ref 17–63)
AST: 23 U/L (ref 15–41)
Albumin: 3.2 g/dL — ABNORMAL LOW (ref 3.5–5.0)
Alkaline Phosphatase: 69 U/L (ref 38–126)
Anion gap: 11 (ref 5–15)
BILIRUBIN TOTAL: 0.9 mg/dL (ref 0.3–1.2)
BUN: 24 mg/dL — AB (ref 6–20)
CO2: 28 mmol/L (ref 22–32)
Calcium: 8.3 mg/dL — ABNORMAL LOW (ref 8.9–10.3)
Chloride: 105 mmol/L (ref 101–111)
Creatinine, Ser: 1.71 mg/dL — ABNORMAL HIGH (ref 0.61–1.24)
GFR calc Af Amer: 40 mL/min — ABNORMAL LOW (ref >60)
GFR, EST NON AFRICAN AMERICAN: 35 mL/min — AB (ref >60)
Glucose, Bld: 148 mg/dL — ABNORMAL HIGH (ref 65–99)
POTASSIUM: 3.7 mmol/L (ref 3.5–5.1)
Sodium: 144 mmol/L (ref 135–145)
TOTAL PROTEIN: 6.7 g/dL (ref 6.5–8.1)

## 2018-02-08 LAB — GLUCOSE, CAPILLARY: Glucose-Capillary: 112 mg/dL — ABNORMAL HIGH (ref 65–99)

## 2018-02-08 LAB — TSH: TSH: 3.691 u[IU]/mL (ref 0.350–4.500)

## 2018-02-08 LAB — HEPARIN LEVEL (UNFRACTIONATED)
Heparin Unfractionated: 0.36 IU/mL (ref 0.30–0.70)
Heparin Unfractionated: 0.26 IU/mL — ABNORMAL LOW (ref 0.30–0.70)

## 2018-02-08 LAB — MRSA PCR SCREENING: MRSA by PCR: POSITIVE — AB

## 2018-02-08 MED ORDER — HYDROCODONE-ACETAMINOPHEN 5-325 MG PO TABS
1.0000 | ORAL_TABLET | Freq: Four times a day (QID) | ORAL | Status: DC | PRN
Start: 2018-02-08 — End: 2018-02-13

## 2018-02-08 MED ORDER — ACETAMINOPHEN 325 MG PO TABS: 650.0000 mg | ORAL_TABLET | Freq: Four times a day (QID) | ORAL | Status: DC | PRN

## 2018-02-08 MED ORDER — ASPIRIN EC 325 MG PO TBEC
325.0000 mg | DELAYED_RELEASE_TABLET | Freq: Every day | ORAL | Status: DC
  Administered 2018-02-09 – 2018-02-12 (×4): 325 mg via ORAL
  Filled 2018-02-08 (×4): qty 1

## 2018-02-08 MED ORDER — FUROSEMIDE 10 MG/ML IJ SOLN: 40.0000 mg | Freq: Every day | INTRAMUSCULAR | Status: DC

## 2018-02-08 MED ORDER — CHLORHEXIDINE GLUCONATE CLOTH 2 % EX PADS
6.0000 | MEDICATED_PAD | Freq: Every day | CUTANEOUS | Status: AC
  Administered 2018-02-09 – 2018-02-13 (×5): 6 via TOPICAL

## 2018-02-08 MED ORDER — ROSUVASTATIN CALCIUM 20 MG PO TABS
20.0000 mg | ORAL_TABLET | Freq: Every day | ORAL | Status: DC
  Administered 2018-02-09 – 2018-02-13 (×5): 20 mg via ORAL
  Filled 2018-02-08 (×5): qty 1

## 2018-02-08 MED ORDER — FUROSEMIDE 10 MG/ML IJ SOLN
40.0000 mg | Freq: Two times a day (BID) | INTRAMUSCULAR | Status: DC
  Administered 2018-02-08 – 2018-02-09 (×4): 40 mg via INTRAVENOUS
  Filled 2018-02-08 (×4): qty 4

## 2018-02-08 MED ORDER — TAMSULOSIN HCL 0.4 MG PO CAPS
0.4000 mg | ORAL_CAPSULE | Freq: Every day | ORAL | Status: DC
  Administered 2018-02-09 – 2018-02-13 (×5): 0.4 mg via ORAL
  Filled 2018-02-08 (×5): qty 1

## 2018-02-08 MED ORDER — MELATONIN 5 MG PO TABS
1.0000 | ORAL_TABLET | Freq: Every evening | ORAL | Status: DC | PRN
  Filled 2018-02-08: qty 1

## 2018-02-08 MED ORDER — SODIUM CHLORIDE 0.9 % IV SOLN: 250.0000 mL | INTRAVENOUS | Status: DC | PRN

## 2018-02-08 MED ORDER — FLUTICASONE PROPIONATE 50 MCG/ACT NA SUSP
1.0000 | Freq: Two times a day (BID) | NASAL | Status: DC
  Administered 2018-02-08 – 2018-02-13 (×10): 1 via NASAL
  Filled 2018-02-08: qty 16

## 2018-02-08 MED ORDER — ACETAMINOPHEN 650 MG RE SUPP: 650.0000 mg | Freq: Four times a day (QID) | RECTAL | Status: DC | PRN

## 2018-02-08 MED ORDER — HEPARIN (PORCINE) IN NACL 100-0.45 UNIT/ML-% IJ SOLN
1500.0000 [IU]/h | INTRAMUSCULAR | Status: DC
  Administered 2018-02-08: 1500 [IU]/h via INTRAVENOUS
  Filled 2018-02-08 (×2): qty 250

## 2018-02-08 MED ORDER — SERTRALINE HCL 25 MG PO TABS
25.0000 mg | ORAL_TABLET | Freq: Every day | ORAL | Status: DC
  Administered 2018-02-09 – 2018-02-13 (×5): 25 mg via ORAL
  Filled 2018-02-08 (×5): qty 1

## 2018-02-08 MED ORDER — TECHNETIUM TO 99M ALBUMIN AGGREGATED
4.4000 | Freq: Once | INTRAVENOUS | Status: AC | PRN
  Administered 2018-02-08: 4.4 via INTRAVENOUS

## 2018-02-08 MED ORDER — AMLODIPINE BESYLATE 5 MG PO TABS
5.0000 mg | ORAL_TABLET | Freq: Every day | ORAL | Status: DC
  Administered 2018-02-09 – 2018-02-13 (×5): 5 mg via ORAL
  Filled 2018-02-08 (×5): qty 1

## 2018-02-08 MED ORDER — SODIUM CHLORIDE 0.9% FLUSH: 3.0000 mL | INTRAVENOUS | Status: DC | PRN

## 2018-02-08 MED ORDER — ISOSORBIDE MONONITRATE ER 60 MG PO TB24
30.0000 mg | ORAL_TABLET | Freq: Every day | ORAL | Status: DC
  Administered 2018-02-09: 30 mg via ORAL
  Filled 2018-02-08: qty 1

## 2018-02-08 MED ORDER — SODIUM CHLORIDE 0.9% FLUSH
3.0000 mL | Freq: Two times a day (BID) | INTRAVENOUS | Status: DC
  Administered 2018-02-08 – 2018-02-13 (×9): 3 mL via INTRAVENOUS

## 2018-02-08 MED ORDER — ISOSORBIDE MONONITRATE ER 60 MG PO TB24: 30.0000 mg | ORAL_TABLET | Freq: Every day | ORAL | Status: DC

## 2018-02-08 MED ORDER — TECHNETIUM TC 99M DIETHYLENETRIAME-PENTAACETIC ACID
32.8000 | Freq: Once | INTRAVENOUS | Status: AC | PRN
  Administered 2018-02-08: 32.8 via INTRAVENOUS

## 2018-02-08 MED ORDER — INSULIN ASPART 100 UNIT/ML ~~LOC~~ SOLN
0.0000 [IU] | SUBCUTANEOUS | Status: DC
  Administered 2018-02-10 (×2): 3 [IU] via SUBCUTANEOUS
  Administered 2018-02-10: 7 [IU] via SUBCUTANEOUS
  Administered 2018-02-11: 5 [IU] via SUBCUTANEOUS
  Administered 2018-02-11: 2 [IU] via SUBCUTANEOUS
  Administered 2018-02-11: 7 [IU] via SUBCUTANEOUS
  Administered 2018-02-11: 3 [IU] via SUBCUTANEOUS
  Administered 2018-02-11: 2 [IU] via SUBCUTANEOUS
  Administered 2018-02-12: 3 [IU] via SUBCUTANEOUS
  Administered 2018-02-12: 7 [IU] via SUBCUTANEOUS
  Administered 2018-02-12 (×2): 2 [IU] via SUBCUTANEOUS
  Administered 2018-02-12: 3 [IU] via SUBCUTANEOUS
  Administered 2018-02-12 (×2): 7 [IU] via SUBCUTANEOUS
  Administered 2018-02-13: 9 [IU] via SUBCUTANEOUS
  Administered 2018-02-13: 5 [IU] via SUBCUTANEOUS
  Administered 2018-02-13 (×2): 1 [IU] via SUBCUTANEOUS

## 2018-02-08 MED ORDER — MUPIROCIN 2 % EX OINT
1.0000 "application " | TOPICAL_OINTMENT | Freq: Two times a day (BID) | CUTANEOUS | Status: AC
  Administered 2018-02-09 – 2018-02-13 (×10): 1 via NASAL
  Filled 2018-02-08 (×2): qty 22

## 2018-02-08 MED ORDER — DEXTROSE 50 % IV SOLN
INTRAVENOUS | Status: AC
  Administered 2018-02-08: 50 mL
  Filled 2018-02-08: qty 50

## 2018-02-08 NOTE — ED Notes (Signed)
Pt getting combative with Cytogeneticist.

## 2018-02-08 NOTE — ED Notes (Signed)
Pt transported to [place]

## 2018-02-08 NOTE — Care Management Note (Signed)
Case Management Note  Patient Details  Name: Frank Simpson MRN: [id number] Date of Birth: 03/26/1931  CM noted pt was active with [facility].  Contacted [facility] who advised [facility].  Expected Discharge Date:  (unknown)               Expected Discharge Plan:  [place]  Discharge planning Services  CM Consult  Post Acute Care Choice:  Home Health Choice offered to:  Patient, Spouse  HH Arranged:  PT HH Agency:  [place]  Status of Service:  In process, will continue to follow  [name], RN 02/08/2018, 2:34 PM

## 2018-02-08 NOTE — Progress Notes (Signed)
PROGRESS NOTE    DONA KLEMANN  QZR:[id number] DOB: [birth year] DOA: 02/07/2018 PCP: [name], MD    Brief Narrative:  84 year old male who presented to chest pain and shortness of breath.  He does have a significant past medical history for coronary artery disease, congestive heart failure, chronic kidney disease stage III, hypertension, dyslipidemia, and type 2 diabetes mellitus.  Patient complained of several days of worsening dyspnea, associated with orthopnea, lower extremity edema, dry cough, and chest pain.  On the initial physical examination blood pressure 106/84, heart rate 103, temperature 97.5, respiratory rate 22, oxygen saturation 95%.  Positive JVD, lungs with rales bilaterally, no wheezing, heart S1-S2 present and rhythmic, no gallops, rubs or murmurs, abdomen soft nontender, positive lower extremity edema.  Sodium 143, potassium 4.1, chloride 107, bicarb 25, glucose 137, BUN 39, creatinine 1.66, white count 8.8, hemoglobin 14.2, hematocrit 44.6, platelets 208, d-dimer 3.06, chest x-ray with increased seizure markings bilaterally, urinalysis with 150 glucose, 30 protein, specific gravity 1.017, white cell count 0-5, RBC 0-5, EKG atrial sense, V paced, pacing related left bundle branch block with left axis deviation.   Patient was admitted to the hospital with a working diagnosis of acute decompensation of chronic diastolic heart failure.    Assessment & Plan:   Principal Problem:   CHF (congestive heart failure) (HCC) Active Problems:   Tachycardia   ARF (acute renal failure) (HCC)   Elevated troponin   Acute CHF (congestive heart failure) ([place])   1.  Acute on chronic diastolic heart failure complicated with acute cardiogenic pulmonary edema, (present on admission). Will continue diuresis with IV furosemide, target negative fluid balance. Continue heart failure management with carvedilol, and hydralazine. Old records personally reviewed echocardiogram from 2017 with  normal LV systolic function. Strict in and out, daily weight. Elevated troponin due to heart failure, no clinical signs of acute coronary syndrome.   2.  Chronic kidney disease stage 3. Baseline cr at 1,5, will continue diuresis with furosemide, follow urine output, renal function and electrolytes. Avoid hypotension or nephrotoxic medications.   3.  Type 2 diabetes mellitus. Will continue insulin sliding scale for glucose cover and monitoring. Patient tolerating po well.   4.  Hypertension. Continue hydralazine for blood pressure control.   5. Elevated d dimer. Chest film personally reviewed, noted cardiogenic pulmonary edema, than can explain patient's dyspnea. Follow on VQ lung scan, if negative will hold on IV heparin.    DVT prophylaxis: enoxaparin  Code Status:  full Family Communication: I spoke with patient's family at the bedside and all questions were addressed.  Disposition Plan: home when clinically improved   Consultants:   Cardiology   Procedures:     Antimicrobials:       Subjective: Patient is confused and disorientated, positive dyspnea, on soft wrist restrains, patient's sister at the bedside.   Objective: Vitals:   02/08/18 1200 02/08/18 1230 02/08/18 1300 02/08/18 1307  BP: 138/72 (!) 145/81 (!) 142/90 (!) 142/90  Pulse: 81   81  Resp: (!) 23 (!) _0 Temp:      TempSrc:      SpO2:    99%  Weight:       No intake or output data in the 24 hours ending 02/08/18 1356 Filed Weights   02/07/18 2200  Weight: 106.6 kg (235 lb)    Examination:   General: deconditioned and ill looking appearing Neurology: Confused and disorientated non focal  E ENT: mild pallor, no icterus, oral mucosa  moist Cardiovascular: Positive JVD. S1-S2 present, rhythmic, no gallops, rubs, or murmurs. +++ pitting lower extremity edema. Pulmonary: decreased breath sounds bilaterally, poor air movement, no wheezing, or rhonchi, scattered rales. Gastrointestinal. Abdomen  protuberant, no organomegaly, non tender, no rebound or guarding Skin. No rashes Musculoskeletal: no joint deformities     Data Reviewed: I have personally reviewed following labs and imaging studies  CBC: Recent Labs  Lab 02/07/18 1942 02/08/18 0634  WBC 8.8 8.1  HGB 14.2 12.3*  HCT 44.6 38.9*  MCV 92.7 91.7  PLT 208 828   Basic Metabolic Panel: Recent Labs  Lab 02/07/18 1942  NA 143  K 4.1  CL 107  CO2 25  GLUCOSE 137*  BUN 29*  CREATININE 1.66*  CALCIUM 9.1   GFR: Estimated Creatinine Clearance: 41 mL/min (A) (by C-G formula based on SCr of 1.66 mg/dL (H)). Liver Function Tests: No results for input(s): AST, ALT, ALKPHOS, BILITOT, PROT, ALBUMIN in the last 168 hours. No results for input(s): LIPASE, AMYLASE in the last 168 hours. No results for input(s): AMMONIA in the last 168 hours. Coagulation Profile: Recent Labs  Lab 02/07/18 2157  INR 1.10   Cardiac Enzymes: Recent Labs  Lab 02/08/18 1054  TROPONINI 0.33*   BNP (last 3 results) Recent Labs    03/07/17 1255  PROBNP 379.0*   HbA1C: No results for input(s): HGBA1C in the last 72 hours. CBG: No results for input(s): GLUCAP in the last 168 hours. Lipid Profile: No results for input(s): CHOL, HDL, LDLCALC, TRIG, CHOLHDL, LDLDIRECT in the last 72 hours. Thyroid Function Tests: No results for input(s): TSH, T4TOTAL, FREET4, T3FREE, THYROIDAB in the last 72 hours. Anemia Panel: No results for input(s): VITAMINB12, FOLATE, FERRITIN, TIBC, IRON, RETICCTPCT in the last 72 hours.    Radiology Studies: I have reviewed all of the imaging during this hospital visit personally     Scheduled Meds: . carvedilol  3.125 mg Oral BID WC  . furosemide  40 mg Intravenous BID  . hydrALAZINE  25 mg Oral Q8H  . nitroGLYCERIN  1 inch Topical Q8H   Continuous Infusions: . heparin 1,300 Units/hr (02/07/18 2202)     LOS: 1 day        Shefali Ng Gerome Apley, MD [facility] Pager  [phone]
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
## 2018-02-08 NOTE — ED Notes (Addendum)
Pt removed condom catheter, EKG leads, and IV. Became combative when staff attempted to replace IV. Pt will be placed in soft restraints for safety.

## 2018-02-08 NOTE — ED Notes (Addendum)
Pt has taken gown off, taken cardiac monitoring off and removed IV. RN notified; patient cleaned up, placed back on cardiac monitoring and dressed again.
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
## 2018-02-08 NOTE — Progress Notes (Signed)
Patient arrived from ED around 1700. Patient not in restraints on arrival. Order D/C from chart as restraints are currently not appropriate for patient

## 2018-02-08 NOTE — Progress Notes (Signed)
ANTICOAGULATION CONSULT NOTE - Follow Up Consult  Pharmacy Consult for Heparin Indication: chest pain/ACS, r/o PE  See previous progress not from Gretta Arab, PharmD for full details.  In brief, Pharmacy consulted to dose IV heparin.  Heparin level subtherapeutic (0.26) on 1300 units/hr  Plan: - Increase heparin to 1500 units/hr - Recheck heparin level in [place], PharmD, BCPS Pager: [phone] 02/08/2018 4:08 PM

## 2018-02-08 NOTE — Progress Notes (Signed)
ANTICOAGULATION CONSULT NOTE - Follow Up Consult  Pharmacy Consult for Heparin Indication: chest pain/ACS  Allergies  Allergen Reactions  . Metformin And Related Other (See Comments)    Affected kidneys  . Oxycodone Other (See Comments)    Severe confusion  . Januvia [Sitagliptin Phosphate] Other (See Comments)    Possibly affected kidneys?  . Codeine   . Penicillins Other (See Comments)    Reaction long time ago - doesn't remember    Patient Measurements: Weight: 235 lb (106.6 kg)  IBW: 77.6 Heparin Dosing Weight: 100kg  Vital Signs: Temp: 97.5 F (36.4 C) (03/13 2007) Temp Source: Oral (03/13 2007) BP: 141/90 (03/14 0631) Pulse Rate: 98 (03/14 0631)  Labs: Recent Labs    02/07/18 1942 02/07/18 2157 02/08/18 0634  HGB 14.2  --  12.3*  HCT 44.6  --  38.9*  PLT 208  --  192  APTT  --  27  --   LABPROT  --  14.1  --   INR  --  1.10  --   CREATININE 1.66*  --   --     Estimated Creatinine Clearance: 41 mL/min (A) (by C-G formula based on SCr of 1.66 mg/dL (H)).   Medications:  Infusions:  . heparin 1,300 Units/hr (02/07/18 2202)    Assessment: 83 yr male presents with c/o shortness of breath and chest pain for 2 days.  Significant PMH includes CAD, CHF, CVA, DM and underwent surgery for a hip fracture 2 months ago  Pharmacy consulted to dose IV heparin for ACS/STEMI  Patient on no oral anticoagulation PTA   Today, 02/08/2018:  Trop 0.42  D-dimer 3.02  Heparin level 0.36  CBC:  Hgb decreased to 12.3, Plt 192  IV interruption:  RN notes that overnight, pt removed gown, catheter, EKG leads, and IV.  He became combative while trying to replace IV.  Heparin was off for about 10-15 min.   No bleeding reported.  RN reports a small bruise where pt removed IV.  Goal of Therapy:  Heparin level 0.3-0.7 units/ml Monitor platelets by anticoagulation protocol: Yes   Plan:  Continue heparin IV infusion at 1300 units/hr Heparin level in 8 hours to confirm  therapeutic level. Daily heparin level and CBC Continue to monitor H&H and platelets   Gretta Arab PharmD, BCPS Pager [phone] 02/08/2018 7:09 AM

## 2018-02-09 ENCOUNTER — Inpatient Hospital Stay (HOSPITAL_COMMUNITY): Payer: Medicare Other

## 2018-02-09 DIAGNOSIS — I351 Nonrheumatic aortic (valve) insufficiency: Secondary | ICD-10-CM

## 2018-02-09 DIAGNOSIS — Z794 Long term (current) use of insulin: Secondary | ICD-10-CM

## 2018-02-09 DIAGNOSIS — E1122 Type 2 diabetes mellitus with diabetic chronic kidney disease: Secondary | ICD-10-CM

## 2018-02-09 DIAGNOSIS — J810 Acute pulmonary edema: Secondary | ICD-10-CM

## 2018-02-09 DIAGNOSIS — I340 Nonrheumatic mitral (valve) insufficiency: Secondary | ICD-10-CM

## 2018-02-09 LAB — GLUCOSE, CAPILLARY
GLUCOSE-CAPILLARY: 68 mg/dL (ref 65–99)
GLUCOSE-CAPILLARY: 83 mg/dL (ref 65–99)
GLUCOSE-CAPILLARY: 64 mg/dL — AB (ref 65–99)
GLUCOSE-CAPILLARY: 110 mg/dL — AB (ref 65–99)
GLUCOSE-CAPILLARY: 88 mg/dL (ref 65–99)
Glucose-Capillary: 145 mg/dL — ABNORMAL HIGH (ref 65–99)
Glucose-Capillary: 83 mg/dL (ref 65–99)
Glucose-Capillary: 85 mg/dL (ref 65–99)
Glucose-Capillary: 89 mg/dL (ref 65–99)
Glucose-Capillary: 89 mg/dL (ref 65–99)
Glucose-Capillary: 88 mg/dL (ref 65–99)

## 2018-02-09 LAB — CBC
HCT: 40.8 % (ref 39.0–52.0)
Hemoglobin: 12.9 g/dL — ABNORMAL LOW (ref 13.0–17.0)
MCH: 29.1 pg (ref 26.0–34.0)
MCHC: 31.6 g/dL (ref 30.0–36.0)
MCV: 92.1 fL (ref 78.0–100.0)
Platelets: 180 10*3/uL (ref 150–400)
RBC: 4.43 MIL/uL (ref 4.22–5.81)
RDW: 15.7 % — AB (ref 11.5–15.5)
WBC: 6.4 10*3/uL (ref 4.0–10.5)

## 2018-02-09 LAB — BASIC METABOLIC PANEL
ANION GAP: 12 (ref 5–15)
BUN: 24 mg/dL — ABNORMAL HIGH (ref 6–20)
CO2: 25 mmol/L (ref 22–32)
Calcium: 8.5 mg/dL — ABNORMAL LOW (ref 8.9–10.3)
Chloride: 106 mmol/L (ref 101–111)
Creatinine, Ser: 1.69 mg/dL — ABNORMAL HIGH (ref 0.61–1.24)
GFR calc Af Amer: 41 mL/min — ABNORMAL LOW (ref >60)
GFR calc non Af Amer: 35 mL/min — ABNORMAL LOW (ref >60)
GLUCOSE: 86 mg/dL (ref 65–99)
POTASSIUM: 3.8 mmol/L (ref 3.5–5.1)
Sodium: 143 mmol/L (ref 135–145)

## 2018-02-09 LAB — HEPARIN LEVEL (UNFRACTIONATED): HEPARIN UNFRACTIONATED: 0.52 [IU]/mL (ref 0.30–0.70)

## 2018-02-09 LAB — ECHOCARDIOGRAM COMPLETE
Height: 71 in
Weight: 3562.63 oz

## 2018-02-09 MED ORDER — HEPARIN SODIUM (PORCINE) 5000 UNIT/ML IJ SOLN
5000.0000 [IU] | Freq: Three times a day (TID) | INTRAMUSCULAR | Status: DC
  Administered 2018-02-09 – 2018-02-13 (×13): 5000 [IU] via SUBCUTANEOUS
  Filled 2018-02-09 (×13): qty 1

## 2018-02-09 MED ORDER — DEXTROSE 50 % IV SOLN
INTRAVENOUS | Status: AC
  Administered 2018-02-09: 25 mL
  Filled 2018-02-09: qty 50

## 2018-02-09 MED ORDER — ISOSORBIDE MONONITRATE ER 60 MG PO TB24
60.0000 mg | ORAL_TABLET | Freq: Every day | ORAL | Status: DC
  Administered 2018-02-10 – 2018-02-13 (×4): 60 mg via ORAL
  Filled 2018-02-09 (×4): qty 1

## 2018-02-09 NOTE — Progress Notes (Signed)
ANTICOAGULATION CONSULT NOTE - Follow Up Consult  Pharmacy Consult for Heparin  Indication: chest pain/ACS, r/o PE   Allergies  Allergen Reactions  . Metformin And Related Other (See Comments)    Affected kidneys  . Oxycodone Other (See Comments)    Severe confusion  . Januvia [Sitagliptin Phosphate] Other (See Comments)    Possibly affected kidneys?  . Codeine   . Penicillins Other (See Comments)    Reaction long time ago - doesn't remember    Patient Measurements: Height: 5' 11" (180.3 cm) Weight: 222 lb 10.6 oz (101 kg) IBW/kg (Calculated) : 75.3 Heparin Dosing Weight:   Vital Signs: Temp: 97.5 F (36.4 C) (03/15 0330) Temp Source: Axillary (03/15 0330) BP: 118/50 (03/15 0500) Pulse Rate: 64 (03/15 0500)  Labs: Recent Labs    02/07/18 1942 02/07/18 2157 02/08/18 0634 02/08/18 1054 02/08/18 1517 02/08/18 2112 02/09/18 0151 02/09/18 0347  HGB 14.2  --  12.3*  --   --   --   --  12.9*  HCT 44.6  --  38.9*  --   --   --   --  40.8  PLT 208  --  192  --   --   --   --  180  APTT  --  27  --   --   --   --   --   --   LABPROT  --  14.1  --   --   --   --   --   --   INR  --  1.10  --   --   --   --   --   --   HEPARINUNFRC  --   --  0.36  --  0.26*  --  0.52  --   CREATININE 1.66*  --   --   --   --  1.71*  --  1.69*  TROPONINI  --   --   --  0.33* 0.34* 0.31*  --   --     Estimated Creatinine Clearance: 38.7 mL/min (A) (by C-G formula based on SCr of 1.69 mg/dL (H)).   Medications:  Infusions:  . sodium chloride    . heparin 1,500 Units/hr (02/08/18 1758)    Assessment: Patient with heparin level at goal.  No heparin issues noted.  Goal of Therapy:  Heparin level 0.3-0.7 units/ml Monitor platelets by anticoagulation protocol: Yes   Plan:  Continue heparin drip at current rate Recheck level at [place] 02/09/2018,5:22 AM

## 2018-02-09 NOTE — Progress Notes (Addendum)
PROGRESS NOTE    Frank Simpson  DPO:[id number] DOB: 1931-10-11 DOA: 02/07/2018 PCP: [name], MD    Brief Narrative:  84 year old male who presented to chest pain and shortness of breath.  He does have a significant past medical history for coronary artery disease, congestive heart failure, chronic kidney disease stage III, hypertension, dyslipidemia, and type 2 diabetes mellitus.  Patient complained of several days of worsening dyspnea, associated with orthopnea, lower extremity edema, dry cough, and chest pain.  On the initial physical examination blood pressure 106/84, heart rate 103, temperature 97.5, respiratory rate 22, oxygen saturation 95%.  Positive JVD, lungs with rales bilaterally, no wheezing, heart S1-S2 present and rhythmic, no gallops, rubs or murmurs, abdomen soft nontender, positive lower extremity edema.  Sodium 143, potassium 4.1, chloride 107, bicarb 25, glucose 137, BUN 39, creatinine 1.66, white count 8.8, hemoglobin 14.2, hematocrit 44.6, platelets 208, d-dimer 3.06, chest x-ray with increased seizure markings bilaterally, urinalysis with 150 glucose, 30 protein, specific gravity 1.017, white cell count 0-5, RBC 0-5, EKG atrial sense, V paced, pacing related left bundle branch block with left axis deviation.   Patient was admitted to the hospital with a working diagnosis of acute decompensation of chronic diastolic heart failure.  Assessment & Plan:   Principal Problem:   CHF (congestive heart failure) (HCC) Active Problems:   Tachycardia   ARF (acute renal failure) (HCC)   Elevated troponin   Acute CHF (congestive heart failure) ([place])  1.  Acute on chronic diastolic heart failure complicated with acute cardiogenic pulmonary edema, (present on admission). Patient with worsening respiratory distress yesterday, with increase work of breathing, responded well to non invasive mechanical ventilation. Urine output 1000 ml over last 24 hours, continue heart failure  management with carvedilol, afterload reduction with isosorbide and hydralazine. Continue furosemide 40 mg IV q12. Follow on echocardiogram.   2. Impending respiratory failure. Responded well to non invasive mechanical ventilation 12/6 with Fi02 30%, Vt 500 to 600 ml, MV 10 to 16. Will attempt transition to regular nasal cannula.   2.  Chronic kidney disease stage 3. Stable renal function with serum cr at 1.60, K at 3,8 and serum bicarbonate at 25. Will continue furosemide IV to target negative fluid balance.   3.  Type 2 diabetes mellitus. Patient had episode of hypoglycemia, will continue with Insulin sliding scale for glucose cover and monitoring.encourage po intake. Hold long acting insulin, use d50 as needed.   4.  Hypertension. On amlodipine, hydralazine/ isosorbide and coreg for blood pressure control. Blood pressure systolic 431 to 540 mmHg  5. Elevated d dimer.Pulmonary VQ scan low probability, will hold on heparin drip and continue dvt prophylaxis.   6. Acute metabolic encephalopathy. Likely due to heart failure decompensation, continue neuro checks per unit protocol, aspiration precautions, mittens as needed. Fall precautions.   DVT prophylaxis: enoxaparin  Code Status:  full Family Communication: no family at the bedside  Disposition Plan: home when clinically improved   Consultants:   Cardiology   Procedures:     Antimicrobials:       Subjective: Improved dyspnea, no chest pain, no nausea or vomiting. Tolerating well full face mask.   Objective: Vitals:   02/09/18 0400 02/09/18 0500 02/09/18 0722 02/09/18 0827  BP: (!) 146/77 (!) 118/50    Pulse: 60 64  75  Resp: _0 Temp:   (!) 97.3 F (36.3 C)   TempSrc:   Axillary   SpO2: 99% 96%  100%  Weight:  Height:        Intake/Output Summary (Last 24 hours) at 02/09/2018 0837 Last data filed at 02/09/2018 0500 Gross per 24 hour  Intake 280.5 ml  Output 1000 ml  Net -719.5 ml   Filed  Weights   02/07/18 2200 02/08/18 1700 02/09/18 0330  Weight: 106.6 kg (235 lb) 102.7 kg (226 lb 6.6 oz) 101 kg (222 lb 10.6 oz)    Examination:   General: Not in pain or dyspnea, deconditioned Neurology: Awake and alert, non focal  E ENT: mild pallor, no icterus, oral mucosa moist Cardiovascular: No JVD. S1-S2 present, rhythmic, no gallops, rubs, or murmurs. +++ pitting  lower extremity edema. Pulmonary: deceased breath sounds bilaterally, improved air movement, no wheezing, or rhonchi. Scattered rales. Gastrointestinal. Abdomen protiuberant, no organomegaly, non tender, no rebound or guarding Skin. No rashes Musculoskeletal: no joint deformities     Data Reviewed: I have personally reviewed following labs and imaging studies  CBC: Recent Labs  Lab 02/07/18 1942 02/08/18 0634 02/09/18 0347  WBC 8.8 8.1 6.4  HGB 14.2 12.3* 12.9*  HCT 44.6 38.9* 40.8  MCV 92.7 91.7 92.1  PLT 208 192 956   Basic Metabolic Panel: Recent Labs  Lab 02/07/18 1942 02/08/18 2112 02/09/18 0347  NA 143 144 143  K 4.1 3.7 3.8  CL 107 105 106  CO2 _0 GLUCOSE 137* 148* 86  BUN 29* 24* 24*  CREATININE 1.66* 1.71* 1.69*  CALCIUM 9.1 8.3* 8.5*   GFR: Estimated Creatinine Clearance: 38.7 mL/min (A) (by C-G formula based on SCr of 1.69 mg/dL (H)). Liver Function Tests: Recent Labs  Lab 02/08/18 2112  AST 23  ALT 14*  ALKPHOS 69  BILITOT 0.9  PROT 6.7  ALBUMIN 3.2*   No results for input(s): LIPASE, AMYLASE in the last 168 hours. No results for input(s): AMMONIA in the last 168 hours. Coagulation Profile: Recent Labs  Lab 02/07/18 2157  INR 1.10   Cardiac Enzymes: Recent Labs  Lab 02/08/18 1054 02/08/18 1517 02/08/18 2112  TROPONINI 0.33* 0.34* 0.31*   BNP (last 3 results) Recent Labs    03/07/17 1255  PROBNP 379.0*   HbA1C: No results for input(s): HGBA1C in the last 72 hours. CBG: Recent Labs  Lab 02/08/18 2011 02/08/18 2045 02/08/18 2301 02/09/18 0303  02/09/18 0721  GLUCAP 68 145* 112* 83 64*   Lipid Profile: No results for input(s): CHOL, HDL, LDLCALC, TRIG, CHOLHDL, LDLDIRECT in the last 72 hours. Thyroid Function Tests: Recent Labs    02/08/18 2112  TSH 3.691   Anemia Panel: No results for input(s): VITAMINB12, FOLATE, FERRITIN, TIBC, IRON, RETICCTPCT in the last 72 hours.    Radiology Studies: I have reviewed all of the imaging during this hospital visit personally     Scheduled Meds: . amLODipine  5 mg Oral Daily  . aspirin EC  325 mg Oral Daily  . carvedilol  3.125 mg Oral BID WC  . Chlorhexidine Gluconate Cloth  6 each Topical Q0600  . fluticasone  1 spray Each Nare BID  . furosemide  40 mg Intravenous BID  . heparin injection (subcutaneous)  5,000 Units Subcutaneous Q8H  . hydrALAZINE  25 mg Oral Q8H  . insulin aspart  0-9 Units Subcutaneous Q4H  . isosorbide mononitrate  30 mg Oral Daily  . mupirocin ointment  1 application Nasal BID  . nitroGLYCERIN  1 inch Topical Q8H  . rosuvastatin  20 mg Oral Daily  . sertraline  25 mg Oral Daily  .  sodium chloride flush  3 mL Intravenous Q12H  . tamsulosin  0.4 mg Oral Daily   Continuous Infusions: . sodium chloride       LOS: 2 days        Jordie Schreur Gerome Apley, MD [facility] Pager [phone]

## 2018-02-09 NOTE — Progress Notes (Addendum)
Progress Note  Patient Name: Frank Simpson Date of Encounter: 02/09/2018  Primary Cardiologist: Dr. Angelena Form / Dr. Lovena Le  Subjective   Pt is off BiPAP, on [place], out of restraints and in mittens. Pt is breathing better with some improvement in LE edema.  Inpatient Medications    Scheduled Meds: . amLODipine  5 mg Oral Daily  . aspirin EC  325 mg Oral Daily  . carvedilol  3.125 mg Oral BID WC  . Chlorhexidine Gluconate Cloth  6 each Topical Q0600  . fluticasone  1 spray Each Nare BID  . furosemide  40 mg Intravenous BID  . heparin injection (subcutaneous)  5,000 Units Subcutaneous Q8H  . hydrALAZINE  25 mg Oral Q8H  . insulin aspart  0-9 Units Subcutaneous Q4H  . isosorbide mononitrate  30 mg Oral Daily  . mupirocin ointment  1 application Nasal BID  . nitroGLYCERIN  1 inch Topical Q8H  . rosuvastatin  20 mg Oral Daily  . sertraline  25 mg Oral Daily  . sodium chloride flush  3 mL Intravenous Q12H  . tamsulosin  0.4 mg Oral Daily   Continuous Infusions: . sodium chloride     PRN Meds: sodium chloride, acetaminophen **OR** acetaminophen, hydrALAZINE, HYDROcodone-acetaminophen, Melatonin, sodium chloride flush   Vital Signs    Vitals:   02/09/18 0800 02/09/18 0827 02/09/18 0900 02/09/18 1117  BP: (!) 155/88  138/87   Pulse: 76 75 (!) 59   Resp: _0 Temp:    98.4 F (36.9 C)  TempSrc:    Oral  SpO2: 100% 100% 97%   Weight:      Height:        Intake/Output Summary (Last 24 hours) at 02/09/2018 1135 Last data filed at 02/09/2018 1100 Gross per 24 hour  Intake 280.5 ml  Output 1900 ml  Net -1619.5 ml   Filed Weights   02/07/18 2200 02/08/18 1700 02/09/18 0330  Weight: 235 lb (106.6 kg) 226 lb 6.6 oz (102.7 kg) 222 lb 10.6 oz (101 kg)     Physical Exam   General: Well developed, well nourished, male appearing in no acute distress. Head: Normocephalic, atraumatic.  Neck: Supple without bruits, + JVD Lungs:  Resp regular, off bipap, on [place],  scattered crackles Heart: RRR, S1, S2, no S3, S4, or murmur; no rub. Abdomen: Soft, non-tender, non-distended with normoactive bowel sounds. No hepatomegaly. No rebound/guarding. No obvious abdominal masses. Extremities: No clubbing, cyanosis, 1+ B LE edema. Distal pedal pulses are 2+ bilaterally. Neuro: Alert and oriented X 3. Moves all extremities spontaneously. Psych: Normal affect.  Labs    Chemistry Recent Labs  Lab 02/07/18 1942 02/08/18 2112 02/09/18 0347  NA 143 144 143  K 4.1 3.7 3.8  CL 107 105 106  CO2 _1 GLUCOSE 137* 148* 86  BUN 29* 24* 24*  CREATININE 1.66* 1.71* 1.69*  CALCIUM 9.1 8.3* 8.5*  PROT  --  6.7  --   ALBUMIN  --  3.2*  --   AST  --  23  --   ALT  --  14*  --   ALKPHOS  --  69  --   BILITOT  --  0.9  --   GFRNONAA 36* 35* 35*  GFRAA 42* 40* 41*  ANIONGAP _2 Hematology Recent Labs  Lab 02/07/18 1942 02/08/18 0634 02/09/18 0347  WBC 8.8 8.1 6.4  RBC 4.81 4.24 4.43  HGB 14.2 12.3*  12.9*  HCT 44.6 38.9* 40.8  MCV 92.7 91.7 92.1  MCH 29.5 29.0 29.1  MCHC 31.8 31.6 31.6  RDW 15.6* 15.6* 15.7*  PLT 208 192 180    Cardiac Enzymes Recent Labs  Lab 02/08/18 1054 02/08/18 1517 02/08/18 2112  TROPONINI 0.33* 0.34* 0.31*    Recent Labs  Lab 02/07/18 1948  TROPIPOC 0.42*     BNP Recent Labs  Lab 02/07/18 2157  BNP 1,054.6*     DDimer  Recent Labs  Lab 02/07/18 1941  DDIMER 3.02*     Radiology    Dg Chest 2 View  Result Date: 02/07/2018 CLINICAL DATA:  Shortness of breath, chest pain EXAM: CHEST - 2 VIEW COMPARISON:  01/04/2018 FINDINGS: Left pacer in place with leads in the right atrium and right ventricle. Cardiomegaly with vascular congestion and mild interstitial and alveolar opacities bilaterally, likely edema/CHF. Small bilateral effusions. IMPRESSION: Cardiomegaly, mild to moderate CHF. Small bilateral effusions. Electronically Signed   By: Rolm Baptise M.D.   On: 02/07/2018 18:12   Nm Pulmonary  Perf And Vent  Result Date: 02/08/2018 CLINICAL DATA:  Shortness of breath and chest pain EXAM: NUCLEAR MEDICINE VENTILATION - PERFUSION LUNG SCAN VIEWS: Anterior, posterior, left lateral, right lateral, RPO, LPO, RAO, LAO-ventilation and perfusion RADIOPHARMACEUTICALS:  32.8 mCi of Tc-44mDTPA aerosol inhalation and 4.4 mCi Tc931mAA IV COMPARISON:  Chest radiograph February 07, 2018 FINDINGS: Ventilation: There is somewhat less ventilation on the left fairly diffusely compared to the right. There is no segmental appearing ventilation defect. Perfusion: Radiotracer uptake on the perfusion study is homogeneous and symmetric bilaterally. No appreciable perfusion defect. There is perfusion on the left in areas of decreased ventilation. IMPRESSION: No appreciable perfusion defects. This study constitutes a very low probability of pulmonary embolus. Ventilation defects noted on the left, probably due to a degree of pleural effusion. Electronically Signed   By: WiLowella GripII M.D.   On: 02/08/2018 16:33     Telemetry    Paced rhythm - Personally Reviewed  ECG    No new tracings - Personally Reviewed   Cardiac Studies   Echo 02/09/18 Study Conclusions  - Left ventricle: The cavity size was normal. Wall thickness was   increased in a pattern of moderate LVH. Systolic function was   severely reduced. The estimated ejection fraction was in the   range of 20% to 25%. Diffuse hypokinesis. Features are consistent   with a pseudonormal left ventricular filling pattern, with   concomitant abnormal relaxation and increased filling pressure   (grade 2 diastolic dysfunction). Doppler parameters are   consistent with elevated ventricular end-diastolic filling   pressure. - Aortic valve: There was mild regurgitation. - Mitral valve: There was mild regurgitation.  Impressions:  - When compared to the prior study on 11/10/2016 LVEF has   significantly decreased from 60-65% to 20-25% with  diffuse   hypokinesis and akinesis in the mid and apical segments. There is   no obvious apical thrombus, but Definity echocontrast images   should be done to confirm.  Patient Profile     [age].o. male with PMH CAD s/p PCI to mid LAD, chronic diastolic CHF, complete heart block s/p pacemaker, HTN, HLD, DM, CKD stage 3, who is being seen for dyspnea and chest pain.   Assessment & Plan    1. Acute on chronic diastolic heart failure Echo today with significantly reduced EF to 20-25% when compared to prior study in 2017. Troponins trended overnight peaked at 0.34  and likely related to this newly reduced EF.   Pt is diuresing on 40 mg IV BID. Pt is overall net negative 700 cc with 1 L urine output yesterday. Weight is 222 lbs, down from 235 lb on admission. Continue with gentle diuresis. Daily BMP.   2. Elevated troponin Troponin 0.03 --> 0.33 --> 0.34 --> 0.31, downtrending Continue heparin drip for 24 hrs given elevated troponin. No ischemic workup in the acute setting. This may be driven by acute on chronic congestive heart failure.   3. Elevated Ddimer VQ scan without clear evidence of PE.    4. CHB s/p PPM - interrogated yesterday    Signed, Ledora Bottcher , PA-C 11:35 AM 02/09/2018 Pager: [phone]   History and all data above reviewed.  Patient examined.  I agree with the findings as above. He denies any symptoms.  He denies SOB or pain.  He is oriented to person and place so I think he understands my questions and give appropriate answers  The patient exam reveals COR:RRR  ,  Lungs: Decreased breath sounds on the right   ,  Abd: Positive bowel sounds, no rebound no guarding, Ext No edema  .  All available labs, radiology testing, previous records reviewed. Agree with documented assessment and plan. Acute systolic HF:  The etiology of the reduced EF is not clear.  His troponin trend was only mildly elevated.  Regardless he is not a cath candidate with his frail and  chronically ill state.  We need to manage this medically.  Agree with continued IV diuresis.  I will increase the Imdur and over the next days increase the beta blocker and the hydralazine as we are able.  No ACE/ARB with his renal insufficiency.  I discussed the echo results with the patient.  I reviewed the echo results myself and his EF is very reduced.  Appears globalJeneen Rinks Pricilla Moehle  12:09 PM  02/09/2018

## 2018-02-09 NOTE — Progress Notes (Signed)
  Echocardiogram 2D Echocardiogram has been performed.  Frank Simpson [name] 02/09/2018, 9:18 AM

## 2018-02-10 DIAGNOSIS — G9341 Metabolic encephalopathy: Secondary | ICD-10-CM

## 2018-02-10 DIAGNOSIS — I5021 Acute systolic (congestive) heart failure: Secondary | ICD-10-CM

## 2018-02-10 DIAGNOSIS — E1159 Type 2 diabetes mellitus with other circulatory complications: Secondary | ICD-10-CM

## 2018-02-10 LAB — BASIC METABOLIC PANEL
Anion gap: 16 — ABNORMAL HIGH (ref 5–15)
BUN: 24 mg/dL — ABNORMAL HIGH (ref 6–20)
CHLORIDE: 103 mmol/L (ref 101–111)
CO2: 25 mmol/L (ref 22–32)
Calcium: 8.8 mg/dL — ABNORMAL LOW (ref 8.9–10.3)
Creatinine, Ser: 1.79 mg/dL — ABNORMAL HIGH (ref 0.61–1.24)
GFR calc non Af Amer: 33 mL/min — ABNORMAL LOW (ref >60)
GFR, EST AFRICAN AMERICAN: 38 mL/min — AB (ref >60)
Glucose, Bld: 91 mg/dL (ref 65–99)
Potassium: 3.5 mmol/L (ref 3.5–5.1)
Sodium: 144 mmol/L (ref 135–145)

## 2018-02-10 LAB — GLUCOSE, CAPILLARY
GLUCOSE-CAPILLARY: 247 mg/dL — AB (ref 65–99)
GLUCOSE-CAPILLARY: 309 mg/dL — AB (ref 65–99)
Glucose-Capillary: 91 mg/dL (ref 65–99)
Glucose-Capillary: 101 mg/dL — ABNORMAL HIGH (ref 65–99)
Glucose-Capillary: 243 mg/dL — ABNORMAL HIGH (ref 65–99)

## 2018-02-10 MED ORDER — CARVEDILOL 6.25 MG PO TABS
6.2500 mg | ORAL_TABLET | Freq: Two times a day (BID) | ORAL | Status: DC
  Administered 2018-02-10 – 2018-02-13 (×8): 6.25 mg via ORAL
  Filled 2018-02-10 (×8): qty 1

## 2018-02-10 MED ORDER — FUROSEMIDE 40 MG PO TABS
40.0000 mg | ORAL_TABLET | Freq: Two times a day (BID) | ORAL | Status: DC
  Administered 2018-02-10 – 2018-02-11 (×4): 40 mg via ORAL
  Filled 2018-02-10 (×4): qty 1
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
## 2018-02-10 NOTE — Progress Notes (Signed)
Progress Note  Patient Name: Frank Simpson Date of Encounter: 02/10/2018  Primary Cardiologist:   Lauree Chandler, MD   Subjective   He denies SOB.  He is upset because he did not get to eat.  Swallow study pending  Inpatient Medications    Scheduled Meds: . amLODipine  5 mg Oral Daily  . aspirin EC  325 mg Oral Daily  . carvedilol  3.125 mg Oral BID WC  . Chlorhexidine Gluconate Cloth  6 each Topical Q0600  . fluticasone  1 spray Each Nare BID  . furosemide  40 mg Intravenous BID  . heparin injection (subcutaneous)  5,000 Units Subcutaneous Q8H  . hydrALAZINE  25 mg Oral Q8H  . insulin aspart  0-9 Units Subcutaneous Q4H  . isosorbide mononitrate  60 mg Oral Daily  . mupirocin ointment  1 application Nasal BID  . rosuvastatin  20 mg Oral Daily  . sertraline  25 mg Oral Daily  . sodium chloride flush  3 mL Intravenous Q12H  . tamsulosin  0.4 mg Oral Daily   Continuous Infusions: . sodium chloride     PRN Meds: sodium chloride, acetaminophen **OR** acetaminophen, hydrALAZINE, HYDROcodone-acetaminophen, Melatonin, sodium chloride flush   Vital Signs    Vitals:   02/10/18 0400 02/10/18 0500 02/10/18 0600 02/10/18 0700  BP: (!) 142/58  (!) 146/66   Pulse: 74 99 71 78  Resp: 17 (!) 28 (!) 28 (!) 22  Temp:      TempSrc:      SpO2: 98% (!) 88% 92% 96%  Weight:      Height:        Intake/Output Summary (Last 24 hours) at 02/10/2018 0728 Last data filed at 02/10/2018 0500 Gross per 24 hour  Intake -  Output 3450 ml  Net -3450 ml   Filed Weights   02/08/18 1700 02/09/18 0330 02/10/18 0332  Weight: 226 lb 6.6 oz (102.7 kg) 222 lb 10.6 oz (101 kg) 215 lb 6.2 oz (97.7 kg)    Telemetry    Ventricular paced - Personally Reviewed  ECG    NA - Personally Reviewed  Physical Exam   GEN: No acute distress.   Neck: No  JVD Cardiac: RRR, no murmurs, rubs, or gallops.  Respiratory: Clear  to auscultation bilaterally. GI: Soft, nontender, non-distended    MS: No  edema; No deformity. Neuro:  Nonfocal  Psych: Normal affect   Labs    Chemistry Recent Labs  Lab 02/08/18 2112 02/09/18 0347 02/10/18 0355  NA 144 143 144  K 3.7 3.8 3.5  CL 105 106 103  CO2 _0 GLUCOSE 148* 86 91  BUN 24* 24* 24*  CREATININE 1.71* 1.69* 1.79*  CALCIUM 8.3* 8.5* 8.8*  PROT 6.7  --   --   ALBUMIN 3.2*  --   --   AST 23  --   --   ALT 14*  --   --   ALKPHOS 69  --   --   BILITOT 0.9  --   --   GFRNONAA 35* 35* 33*  GFRAA 40* 41* 38*  ANIONGAP 11 12 16*     Hematology Recent Labs  Lab 02/07/18 1942 02/08/18 0634 02/09/18 0347  WBC 8.8 8.1 6.4  RBC 4.81 4.24 4.43  HGB 14.2 12.3* 12.9*  HCT 44.6 38.9* 40.8  MCV 92.7 91.7 92.1  MCH 29.5 29.0 29.1  MCHC 31.8 31.6 31.6  RDW 15.6* 15.6* 15.7*  PLT 208 192 180  Cardiac Enzymes Recent Labs  Lab 02/08/18 1054 02/08/18 1517 02/08/18 2112  TROPONINI 0.33* 0.34* 0.31*    Recent Labs  Lab 02/07/18 1948  TROPIPOC 0.42*     BNP Recent Labs  Lab 02/07/18 2157  BNP 1,054.6*     DDimer  Recent Labs  Lab 02/07/18 1941  DDIMER 3.02*     Radiology    Nm Pulmonary Perf And Vent  Result Date: 02/08/2018 CLINICAL DATA:  Shortness of breath and chest pain EXAM: NUCLEAR MEDICINE VENTILATION - PERFUSION LUNG SCAN VIEWS: Anterior, posterior, left lateral, right lateral, RPO, LPO, RAO, LAO-ventilation and perfusion RADIOPHARMACEUTICALS:  32.8 mCi of Tc-77mDTPA aerosol inhalation and 4.4 mCi Tc913mAA IV COMPARISON:  Chest radiograph February 07, 2018 FINDINGS: Ventilation: There is somewhat less ventilation on the left fairly diffusely compared to the right. There is no segmental appearing ventilation defect. Perfusion: Radiotracer uptake on the perfusion study is homogeneous and symmetric bilaterally. No appreciable perfusion defect. There is perfusion on the left in areas of decreased ventilation. IMPRESSION: No appreciable perfusion defects. This study constitutes a very low  probability of pulmonary embolus. Ventilation defects noted on the left, probably due to a degree of pleural effusion. Electronically Signed   By: WiLowella GripII M.D.   On: 02/08/2018 16:33    Cardiac Studies   Echo 02/09/18 Study Conclusions  - Left ventricle: The cavity size was normal. Wall thickness was increased in a pattern of moderate LVH. Systolic function was severely reduced. The estimated ejection fraction was in the range of 20% to 25%. Diffuse hypokinesis. Features are consistent with a pseudonormal left ventricular filling pattern, with concomitant abnormal relaxation and increased filling pressure (grade 2 diastolic dysfunction). Doppler parameters are consistent with elevated ventricular end-diastolic filling pressure. - Aortic valve: There was mild regurgitation. - Mitral valve: There was mild regurgitation.  Impressions:  - When compared to the prior study on 11/10/2016 LVEF has significantly decreased from 60-65% to 20-25% with diffuse hypokinesis and akinesis in the mid and apical segments. There is no obvious apical thrombus, but Definity echocontrast images should be done to confirm.    Patient Profile     [age].o. male with PMH CAD s/p PCI to mid LAD, chronic diastolic CHF, complete heart block s/p pacemaker, HTN, HLD, DM, CKD stage 3,who is being seen for dyspnea and chest pain.     Assessment & Plan    Acute systolic HF:   PPM device suggested atrial tachycardia.  Device was reprogrammed in the ED.  Question whether this could be related to the acute decreased in EF.  Plan to manage medically as he is not a candidate for invasive evaluation.  Down 4 liters since admission.   Increase Coreg.  We will continue to titrate Coreg and Hydral/nitrates.  I will change to PO Lasix.   Elevated troponin:  Medical management.   CHB:   Device reprogrammed on admission as above.   CKD III:  Creat is relatively stable.   For  questions or updates, please contact [facility] consult [email or web address] for contact info under Cardiology/STEMI.   Signed, JaMinus BreedingMD  02/10/2018, 7:28 AM

## 2018-02-10 NOTE — Progress Notes (Signed)
PROGRESS NOTE    Frank Simpson  VOJ:[id number] DOB: 11-Apr-1931 DOA: 02/07/2018 PCP: [name], MD    Brief Narrative:  84 year old male who presented to chest painandshortness of breath. He does have a significant past medical history for coronary artery disease, congestive heart failure, chronic kidney disease stage III, hypertension, dyslipidemia, andtype 2 diabetes mellitus. Patient complained of several days of worsening dyspnea, associated with orthopnea, lower extremity edema,dry cough, and chest pain. On the initial physical examination blood pressure 106/84, heart rate 103, temperature 97.5, respiratory rate 22, oxygen saturation 95%.Positive JVD, lungs with rales bilaterally, no wheezing, heart S1-S2 present and rhythmic, no gallops, rubs or murmurs, abdomen soft nontender, positive lower extremity edema. Sodium 143, potassium 4.1, chloride 107, bicarb 25, glucose 137, BUN 39, creatinine 1.66, white count 8.8, hemoglobin 14.2, hematocrit 44.6, platelets 208, d-dimer 3.06, chest x-ray with increased seizure markings bilaterally, urinalysis with 150 glucose, 30 protein, specific gravity 1.017, white cell count 0-5, RBC 0-5,EKG atrial sense, V paced, pacing related left bundle branch block with left axis deviation.  Patient was admitted to the hospital with a working diagnosis of acute decompensationof chronic diastolic heart failure.  Assessment & Plan:   Principal Problem:   CHF (congestive heart failure) (HCC) Active Problems:   Tachycardia   ARF (acute renal failure) (HCC)   Elevated troponin   Acute CHF (congestive heart failure) ([place])  1.Acute on chronicsystolic heart failurecomplicated with acute cardiogenic pulmonary edema, (present on admission). Improved volume status, responding well to diuresis, urine output over last 24 hours 3,450 ml. Furosemide changed to po. Will continue strict in and out monitoring, telemetry monitoring. Heart failure  management with carvedilol, holding ace inh or arb due to low GFR. Echocardiogram with depressed systolic LV function with EF 20 to 25%, with diffuse hypokinesis, suggesting non ischemic cardiomyopathy.   2. Acute hypoxic respiratory failure. Patient liberated from non invasive mechanical ventilation, will continue oxymetry monitoring and supplemental 02 per .   2.Chronic kidney disease stage 3. worseing renal function with serum cr at 1.79, K at 3,5 and serum bicarbonate at 25. Furosemide decreased to 40 mg daily, will continue to follow on renal panel in am, avoid hypotension or nephrotoxic medications.  3.Type 2 diabetes mellitus. fasting glucose this am 91, will continue sliding scale insulin for glucose cover and monitoring. Pending swallow evaluation. Trial of pureed diet for now.   4.Hypertension. Continue blood pressure control with amlodipine, hydralazine/ isosorbide and coreg.   5. Acute metabolic encephalopathy. Improved mentation, follow on swallow evaluation, physical therapy evaluation, out of bed to chair. Continue sertraline.   6. Dyslipidemia. Continue rosuvastatin.   DVT prophylaxis:enoxaparin Code Status:full Family Communication:no family at the bedside  Disposition Plan:home when clinically improved   Consultants:  Cardiology  Procedures:    Antimicrobials:   Subjective: Patient is feeling better, dyspnea has improved, less confusion. Improved lower extremity edema, increased appetite.  Objective: Vitals:   02/10/18 0400 02/10/18 0500 02/10/18 0600 02/10/18 0700  BP: (!) 142/58  (!) 146/66   Pulse: 74 99 71 78  Resp: 17 (!) 28 (!) 28 (!) 22  Temp:      TempSrc:      SpO2: 98% (!) 88% 92% 96%  Weight:      Height:        Intake/Output Summary (Last 24 hours) at 02/10/2018 0755 Last data filed at 02/10/2018 0500 Gross per 24 hour  Intake -  Output 3450 ml  Net -3450 ml   [employer]  02/08/18 1700 02/09/18 0330  02/10/18 0332  Weight: 102.7 kg (226 lb 6.6 oz) 101 kg (222 lb 10.6 oz) 97.7 kg (215 lb 6.2 oz)    Examination:   General: deconditioned Neurology: Awake and alert, non focal  E ENT: no pallor, no icterus, oral mucosa moist Cardiovascular: No JVD. S1-S2 present, rhythmic, no gallops, rubs, or murmurs. ++ pitting lower extremity edema. Pulmonary: decreased breath sounds bilaterally at bases, adequate air movement, no wheezing, rhonchi or rales. Gastrointestinal. Abdomen protuberant, no organomegaly, non tender, no rebound or guarding Skin. No rashes Musculoskeletal: no joint deformities     Data Reviewed: I have personally reviewed following labs and imaging studies  CBC: Recent Labs  Lab 02/07/18 1942 02/08/18 0634 02/09/18 0347  WBC 8.8 8.1 6.4  HGB 14.2 12.3* 12.9*  HCT 44.6 38.9* 40.8  MCV 92.7 91.7 92.1  PLT 208 192 175   Basic Metabolic Panel: Recent Labs  Lab 02/07/18 1942 02/08/18 2112 02/09/18 0347 02/10/18 0355  NA 143 144 143 144  K 4.1 3.7 3.8 3.5  CL 107 105 106 103  CO2 _0 GLUCOSE 137* 148* 86 91  BUN 29* 24* 24* 24*  CREATININE 1.66* 1.71* 1.69* 1.79*  CALCIUM 9.1 8.3* 8.5* 8.8*   GFR: Estimated Creatinine Clearance: 36 mL/min (A) (by C-G formula based on SCr of 1.79 mg/dL (H)). Liver Function Tests: Recent Labs  Lab 02/08/18 2112  AST 23  ALT 14*  ALKPHOS 69  BILITOT 0.9  PROT 6.7  ALBUMIN 3.2*   No results for input(s): LIPASE, AMYLASE in the last 168 hours. No results for input(s): AMMONIA in the last 168 hours. Coagulation Profile: Recent Labs  Lab 02/07/18 2157  INR 1.10   Cardiac Enzymes: Recent Labs  Lab 02/08/18 1054 02/08/18 1517 02/08/18 2112  TROPONINI 0.33* 0.34* 0.31*   BNP (last 3 results) Recent Labs    03/07/17 1255  PROBNP 379.0*   HbA1C: No results for input(s): HGBA1C in the last 72 hours. CBG: Recent Labs  Lab 02/09/18 1815 02/09/18 1943 02/09/18 2301 02/10/18 0321 02/10/18 0721    GLUCAP 89 88 88 91 101*   Lipid Profile: No results for input(s): CHOL, HDL, LDLCALC, TRIG, CHOLHDL, LDLDIRECT in the last 72 hours. Thyroid Function Tests: Recent Labs    02/08/18 2112  TSH 3.691   Anemia Panel: No results for input(s): VITAMINB12, FOLATE, FERRITIN, TIBC, IRON, RETICCTPCT in the last 72 hours.    Radiology Studies: I have reviewed all of the imaging during this hospital visit personally     Scheduled Meds: . amLODipine  5 mg Oral Daily  . aspirin EC  325 mg Oral Daily  . carvedilol  6.25 mg Oral BID WC  . Chlorhexidine Gluconate Cloth  6 each Topical Q0600  . fluticasone  1 spray Each Nare BID  . furosemide  40 mg Oral BID  . heparin injection (subcutaneous)  5,000 Units Subcutaneous Q8H  . hydrALAZINE  25 mg Oral Q8H  . insulin aspart  0-9 Units Subcutaneous Q4H  . isosorbide mononitrate  60 mg Oral Daily  . mupirocin ointment  1 application Nasal BID  . rosuvastatin  20 mg Oral Daily  . sertraline  25 mg Oral Daily  . sodium chloride flush  3 mL Intravenous Q12H  . tamsulosin  0.4 mg Oral Daily   Continuous Infusions: . sodium chloride       LOS: 3 days        Mauricio Gerome Apley,  MD [facility] Pager [phone]

## 2018-02-10 NOTE — Progress Notes (Signed)
Pharmacist Heart Failure Core Measure Documentation  Assessment: Frank Simpson has an EF documented as 20-25% on 02/09/18 by ECHO.  Rationale: Heart failure patients with left ventricular systolic dysfunction (LVSD) and an EF < 40% should be prescribed an angiotensin converting enzyme inhibitor (ACEI) or angiotensin receptor blocker (ARB) at discharge unless a contraindication is documented in the medical record.  This patient is not currently on an ACEI or ARB for HF.  This note is being placed in the record in order to provide documentation that a contraindication to the use of these agents is present for this encounter.  ACE Inhibitor or Angiotensin Receptor Blocker is contraindicated (specify all that apply)  _0   ACEI allergy AND ARB allergy _1   Angioedema _2   Moderate or severe aortic stenosis _3   Hyperkalemia _4   Hypotension _5   Renal artery stenosis _6   Worsening renal function, preexisting renal disease or dysfunction >>>on hydralazine/isosorbide as alternative   Biagio Borg 02/10/2018 11:34 AM

## 2018-02-10 NOTE — Progress Notes (Signed)
Received pt from ICU/SD in stable condition. Patient VSs, family at bedside. Pt tele monitor confirmed by CMT, pt arouses easily. Condom cath patent. Pt MRSA + on contact isolation. Bed alarm noted, HOB elevated 30 degrees. WIll cont to monitor. SRP, RN

## 2018-02-11 LAB — CUP PACEART REMOTE DEVICE CHECK
Brady Statistic AP VP Percent: 17 %
Brady Statistic AP VS Percent: 11 %
Brady Statistic AS VP Percent: 29 %
Brady Statistic AS VS Percent: 41 %
Brady Statistic RV Percent Paced: 47 %
Implantable Lead Implant Date: 20151109
Implantable Lead Location: 753859
Lead Channel Impedance Value: 450 Ohm
Lead Channel Pacing Threshold Amplitude: 1 V
Lead Channel Pacing Threshold Amplitude: 1 V
Lead Channel Pacing Threshold Pulse Width: 0.4 ms
Lead Channel Sensing Intrinsic Amplitude: 9.9 mV
Lead Channel Setting Pacing Amplitude: 2 V
Lead Channel Setting Pacing Amplitude: 2.5 V
Lead Channel Setting Pacing Pulse Width: 0.4 ms
MDC IDC LEAD IMPLANT DT: 20151109
MDC IDC LEAD LOCATION: 753860
MDC IDC MSMT BATTERY REMAINING LONGEVITY: 116 mo
MDC IDC MSMT BATTERY REMAINING PERCENTAGE: 95.5 %
MDC IDC MSMT BATTERY VOLTAGE: 3.01 V
MDC IDC MSMT LEADCHNL RA IMPEDANCE VALUE: 390 Ohm
MDC IDC MSMT LEADCHNL RA PACING THRESHOLD PULSEWIDTH: 0.4 ms
MDC IDC MSMT LEADCHNL RA SENSING INTR AMPL: 5 mV
MDC IDC PG IMPLANT DT: 20151109
MDC IDC PG SERIAL: 7666171
MDC IDC SESS DTM: 20190228090015
MDC IDC SET LEADCHNL RV SENSING SENSITIVITY: 2 mV
MDC IDC STAT BRADY RA PERCENT PACED: 26 %

## 2018-02-11 LAB — GLUCOSE, CAPILLARY
GLUCOSE-CAPILLARY: 180 mg/dL — AB (ref 65–99)
Glucose-Capillary: 185 mg/dL — ABNORMAL HIGH (ref 65–99)
Glucose-Capillary: 276 mg/dL — ABNORMAL HIGH (ref 65–99)
Glucose-Capillary: 236 mg/dL — ABNORMAL HIGH (ref 65–99)
Glucose-Capillary: 318 mg/dL — ABNORMAL HIGH (ref 65–99)

## 2018-02-11 LAB — BASIC METABOLIC PANEL
ANION GAP: 14 (ref 5–15)
BUN: 28 mg/dL — ABNORMAL HIGH (ref 6–20)
CALCIUM: 8.8 mg/dL — AB (ref 8.9–10.3)
CO2: 26 mmol/L (ref 22–32)
Chloride: 102 mmol/L (ref 101–111)
Creatinine, Ser: 1.84 mg/dL — ABNORMAL HIGH (ref 0.61–1.24)
GFR, EST AFRICAN AMERICAN: 37 mL/min — AB (ref >60)
GFR, EST NON AFRICAN AMERICAN: 32 mL/min — AB (ref >60)
GLUCOSE: 170 mg/dL — AB (ref 65–99)
POTASSIUM: 3.4 mmol/L — AB (ref 3.5–5.1)
Sodium: 142 mmol/L (ref 135–145)

## 2018-02-11 NOTE — Evaluation (Signed)
Physical Therapy Evaluation Patient Details Name: Frank Simpson MRN: [id number] DOB: 08/23/1931 Today's Date: 02/11/2018   History of Present Illness  84 year old male who presented to chest pain and shortness of breath.  He does have a significant past medical history for recent R femur fx with IM nail January 2019, coronary artery disease, congestive heart failure, chronic kidney disease stage III, hypertension, dyslipidemia, and type 2 diabetes mellitus. Dx of acute on chronic CHF, respiratory failure.   Clinical Impression  Pt admitted with above diagnosis. Pt currently with functional limitations due to the deficits listed below (see PT Problem List). Pt requires +2 assist for mobility. He ambulated 25' with RW, distance limited by RLE buckling. SNF recommended.  Pt will benefit from skilled PT to increase their independence and safety with mobility to allow discharge to the venue listed below.       Follow Up Recommendations SNF;Supervision/Assistance - 24 hour    Equipment Recommendations  None recommended by PT    Recommendations for Other Services       Precautions / Restrictions Precautions Precautions: Fall Restrictions Weight Bearing Restrictions: No      Mobility  Bed Mobility Overal bed mobility: Needs Assistance Bed Mobility: Supine to Sit;Sit to Supine     Supine to sit: Max assist Sit to supine: Total assist;+2 for physical assistance;+2 for safety/equipment   General bed mobility comments: assist to raise trunk and advance BLEs, +2 for sit to supine  Transfers Overall transfer level: Needs assistance   Transfers: Sit to/from Stand Sit to Stand: +2 safety/equipment;Mod assist         General transfer comment: assist to rise/steady, VCs hand placement  Ambulation/Gait Ambulation/Gait assistance: +2 safety/equipment;Min assist Ambulation Distance (Feet): 25 Feet Assistive device: Rolling walker (2 wheeled) Gait Pattern/deviations: Step-to  pattern   Gait velocity interpretation: at or above normal speed for age/gender General Gait Details: distance limited by RLE fatigue/buckling  Stairs            Wheelchair Mobility    Modified Rankin (Stroke Patients Only)       Balance Overall balance assessment: Needs assistance   Sitting balance-Leahy Scale: Fair       Standing balance-Leahy Scale: Poor                               Pertinent Vitals/Pain Pain Assessment: No/denies pain    Home Living Family/patient expects to be discharged to:: Skilled nursing facility                      Prior Function Level of Independence: Needs assistance         Comments: pt from home with wife, per RN she was struggling to care for him, pt not oriented so not able to provide detailed PLOF, no family present     Hand Dominance   Dominant Hand: Left    Extremity/Trunk Assessment   Upper Extremity Assessment Upper Extremity Assessment: Defer to OT evaluation    Lower Extremity Assessment Lower Extremity Assessment: Generalized weakness(R knee ext +3/5, L knee ext 4/5)    Cervical / Trunk Assessment Cervical / Trunk Assessment: Normal  Communication   Communication: HOH  Cognition Arousal/Alertness: Awake/alert Behavior During Therapy: WFL for tasks assessed/performed Overall Cognitive Status: No family/caregiver present to determine baseline cognitive functioning  General Comments: oriented to self only, can follow 1 step commands with increased time and cues      General Comments      Exercises     Assessment/Plan    PT Assessment Patient needs continued PT services  PT Problem List Decreased strength;Decreased mobility;Decreased balance;Decreased activity tolerance;Decreased cognition;Decreased safety awareness       PT Treatment Interventions Gait training;Functional mobility training;Therapeutic activities;Therapeutic  exercise;Balance training;Patient/family education    PT Goals (Current goals can be found in the Care Plan section)  Acute Rehab PT Goals PT Goal Formulation: Patient unable to participate in goal setting Time For Goal Achievement: 02/25/18 Potential to Achieve Goals: Fair    Frequency Min 3X/week   Barriers to discharge Decreased caregiver support wife struggling to care for pt at home per RN    Co-evaluation               AM-PAC PT "6 Clicks" Daily Activity  Outcome Measure Difficulty turning over in bed (including adjusting bedclothes, sheets and blankets)?: Unable Difficulty moving from lying on back to sitting on the side of the bed? : Unable Difficulty sitting down on and standing up from a chair with arms (e.g., wheelchair, bedside commode, etc,.)?: Unable Help needed moving to and from a bed to chair (including a wheelchair)?: A Lot Help needed walking in hospital room?: A Lot Help needed climbing 3-5 steps with a railing? : Total 6 Click Score: 8    End of Session Equipment Utilized During Treatment: Gait belt Activity Tolerance: Patient tolerated treatment well Patient left: in bed;with call bell/phone within reach;with bed alarm set Nurse Communication: Mobility status PT Visit Diagnosis: Unsteadiness on feet (R26.81);Difficulty in walking, not elsewhere classified (R26.2);Muscle weakness (generalized) (M62.81)    Time: 9476-5465 PT Time Calculation (min) (ACUTE ONLY): 23 min   Charges:   PT Evaluation $PT Eval Moderate Complexity: 1 Mod PT Treatments $Gait Training: 8-22 mins   PT G Codes:          Philomena Doheny 02/11/2018, 12:22 PM [phone]

## 2018-02-11 NOTE — Progress Notes (Signed)
Pt wife at bedside and states at discharge she prefer to take pt home with [facility] due to pt has had fall previously in facilities in the past. She states she has family support to assist at home. SRP, RN

## 2018-02-11 NOTE — Progress Notes (Signed)
PROGRESS NOTE    Frank Simpson  TOI:[id number] DOB: June 04, 1931 DOA: 02/07/2018 PCP: [name], MD    Brief Narrative:  84 year old male who presented to chest painandshortness of breath. He does have a significant past medical history for coronary artery disease, congestive heart failure, chronic kidney disease stage III, hypertension, dyslipidemia, andtype 2 diabetes mellitus. Patient complained of several days of worsening dyspnea, associated with orthopnea, lower extremity edema,dry cough, and chest pain. On the initial physical examination blood pressure 106/84, heart rate 103, temperature 97.5, respiratory rate 22, oxygen saturation 95%.Positive JVD, lungs with rales bilaterally, no wheezing, heart S1-S2 present and rhythmic, no gallops, rubs or murmurs, abdomen soft nontender, positive lower extremity edema. Sodium 143, potassium 4.1, chloride 107, bicarb 25, glucose 137, BUN 39, creatinine 1.66, white count 8.8, hemoglobin 14.2, hematocrit 44.6, platelets 208, d-dimer 3.06, chest x-ray with increased seizure markings bilaterally, urinalysis with 150 glucose, 30 protein, specific gravity 1.017, white cell count 0-5, RBC 0-5,EKG atrial sense, V paced, pacing related left bundle branch block with left axis deviation.  Patient was admitted to the hospital with a working diagnosis of acute decompensationof chronic diastolic heart failure.  Assessment & Plan:   Principal Problem:   CHF (congestive heart failure) (HCC) Active Problems:   Tachycardia   ARF (acute renal failure) (HCC)   Elevated troponin   Acute CHF (congestive heart failure) ([place])  1.Acute on chronicsystolic heart failurecomplicated with acute cardiogenic pulmonary edema, (present on admission).  LV function with EF 20 to 25%, with diffuse hypokinesis. Continue diuresis with po furosemide, urine output over last 24 hours 1,322m. Strict in and out. continue telemetry monitoring. On carvedilol, holding  ace inh or arb due to low GFR. After load reduction with hydralazine and isosorbide.   2. Acute hypoxic respiratory failure. Improved oxygenation, at 92% on 2 LPM, no significant dyspnea.   2.Chronic kidney disease stage 3.Stable renal function with serum cr at 1.8, K at 3,4 and serum bicarbonate at 26. Avoid hypotension or nephrotoxic medications.  3.Type 2 diabetes mellitus. Insulin sliding scale insulin for glucose cover and monitoring. Capillary glucose 243, 309, 185, 180, 276.   4.Hypertension.On amlodipine, hydralazine/ isosorbide and coreg, systolic blood pressure 1833to 140 mmHg.   5. Acute metabolic encephalopathy. Continue sertraline. Will attempt to remove mittens, follow with physical therapy evaluation, may need snf.   6. Dyslipidemia. On rosuvastatin.   DVT prophylaxis:enoxaparin Code Status:full Family Communication:no family at the bedside  Disposition Plan:SNF when bed available.    Consultants:  Cardiology  Procedures:    Antimicrobials:   Subjective: Patient with intermittent confusion, had to be placed on mittens, noted to be very weak and deconditioned. No chest pain, improved dyspnea.   Objective: Vitals:   02/10/18 1432 02/10/18 2054 02/11/18 0417 02/11/18 0419  BP: (!) 120/56 127/74 135/76   Pulse: 65 67 67   Resp: 20 (!) 22 (!) 21   Temp: 98 F (36.7 C) 98.7 F (37.1 C) (!) 97.5 F (36.4 C)   TempSrc: Axillary Oral Oral   SpO2: 94% 98% 93%   Weight:    96.8 kg (213 lb 6.5 oz)  Height:        Intake/Output Summary (Last 24 hours) at 02/11/2018 1039 Last data filed at 02/11/2018 0429 Gross per 24 hour  Intake -  Output 1325 ml  Net -1325 ml   Filed Weights   02/09/18 0330 02/10/18 0332 02/11/18 0419  Weight: 101 kg (222 lb 10.6 oz) 97.7 kg (215 lb 6.2  oz) 96.8 kg (213 lb 6.5 oz)    Examination:   General: Not in pain or dyspnea, deconditioned Neurology: somnolent, but easy to arouse, non focal  E  ENT: mild pallor, no icterus, oral mucosa dry Cardiovascular: No JVD. S1-S2 present, rhythmic, no gallops, rubs, or murmurs. +/++ pitting lower extremity edema. Pulmonary: vesicular breath sounds bilaterally at apical zones, poor air movement, no wheezing, rhonchi or rales. Gastrointestinal. Abdomen protuberant, with no organomegaly, non tender, no rebound or guarding Skin. No rashes Musculoskeletal: no joint deformities     Data Reviewed: I have personally reviewed following labs and imaging studies  CBC: Recent Labs  Lab 02/07/18 1942 02/08/18 0634 02/09/18 0347  WBC 8.8 8.1 6.4  HGB 14.2 12.3* 12.9*  HCT 44.6 38.9* 40.8  MCV 92.7 91.7 92.1  PLT 208 192 621   Basic Metabolic Panel: Recent Labs  Lab 02/07/18 1942 02/08/18 2112 02/09/18 0347 02/10/18 0355 02/11/18 0446  NA 143 144 143 144 142  K 4.1 3.7 3.8 3.5 3.4*  CL 107 105 106 103 102  CO2 _0 GLUCOSE 137* 148* 86 91 170*  BUN 29* 24* 24* 24* 28*  CREATININE 1.66* 1.71* 1.69* 1.79* 1.84*  CALCIUM 9.1 8.3* 8.5* 8.8* 8.8*   GFR: Estimated Creatinine Clearance: 34.8 mL/min (A) (by C-G formula based on SCr of 1.84 mg/dL (H)). Liver Function Tests: Recent Labs  Lab 02/08/18 2112  AST 23  ALT 14*  ALKPHOS 69  BILITOT 0.9  PROT 6.7  ALBUMIN 3.2*   No results for input(s): LIPASE, AMYLASE in the last 168 hours. No results for input(s): AMMONIA in the last 168 hours. Coagulation Profile: Recent Labs  Lab 02/07/18 2157  INR 1.10   Cardiac Enzymes: Recent Labs  Lab 02/08/18 1054 02/08/18 1517 02/08/18 2112  TROPONINI 0.33* 0.34* 0.31*   BNP (last 3 results) Recent Labs    03/07/17 1255  PROBNP 379.0*   HbA1C: No results for input(s): HGBA1C in the last 72 hours. CBG: Recent Labs  Lab 02/10/18 1103 02/10/18 1218 02/10/18 1644 02/11/18 0414 02/11/18 0844  GLUCAP 247* 243* 309* 185* 180*   Lipid Profile: No results for input(s): CHOL, HDL, LDLCALC, TRIG, CHOLHDL, LDLDIRECT in  the last 72 hours. Thyroid Function Tests: Recent Labs    02/08/18 2112  TSH 3.691   Anemia Panel: No results for input(s): VITAMINB12, FOLATE, FERRITIN, TIBC, IRON, RETICCTPCT in the last 72 hours.    Radiology Studies: I have reviewed all of the imaging during this hospital visit personally     Scheduled Meds: . amLODipine  5 mg Oral Daily  . aspirin EC  325 mg Oral Daily  . carvedilol  6.25 mg Oral BID WC  . Chlorhexidine Gluconate Cloth  6 each Topical Q0600  . fluticasone  1 spray Each Nare BID  . furosemide  40 mg Oral BID  . heparin injection (subcutaneous)  5,000 Units Subcutaneous Q8H  . hydrALAZINE  25 mg Oral Q8H  . insulin aspart  0-9 Units Subcutaneous Q4H  . isosorbide mononitrate  60 mg Oral Daily  . mupirocin ointment  1 application Nasal BID  . rosuvastatin  20 mg Oral Daily  . sertraline  25 mg Oral Daily  . sodium chloride flush  3 mL Intravenous Q12H  . tamsulosin  0.4 mg Oral Daily   Continuous Infusions: . sodium chloride       LOS: 4 days        Frank Goza Gerome Apley, MD  [facility] Pager [phone]

## 2018-02-11 NOTE — Clinical Social Work Note (Addendum)
Clinical Social Work Assessment  Patient Details  Name: Frank Simpson MRN: [id number] Date of Birth: [birth year]  Date of referral:  02/11/18               Reason for consult:  Facility Placement                Permission sought to share information with:  Facility Art therapist granted to share information::     Name::     Frank Simpson  Agency::  none  Relationship::     Contact Information:     Housing/Transportation Living arrangements for the past 2 months:  Single Family Home Source of Information:  Spouse Patient Interpreter Needed:  None Criminal Activity/Legal Involvement Pertinent to Current Situation/Hospitalization:  No - Comment as needed Significant Relationships:  Spouse, Adult Children, Other Family Members Lives with:  Spouse Do you feel safe going back to the place where you live?  Yes Need for family participation in patient care:  Yes (Comment)  Care giving concerns:  PT is recommending SNF. CSW met with patient/spouse at bedside. Pt asleep and CSW discussed disposition with spouse .  CSW explained her role as CSW.  Spouse indicated that she would like for patient to return home with [facility] PT/OT services as he was receiving that service before hospitalization.  Spouse confirmed that daughter in law helps at home as well.  Spouse indicated that he has been to SNF in the past and she declined for him to return. CSW validated concerns and feelings. CSW explained the role of RNCM in assisting with home health needs.   Social Worker assessment / plan:  CSW will advise RNCM of referral for services.  CSW will sign off at this time as no other CSW intervention needed.  Employment status:  Retired Forensic scientist:  Medicare PT Recommendations:  [place] / Referral to community resources:  Other (Comment Required)(RNCM to assist with Home Needs)  Patient/Family's Response to care:  Family thanked CSW for meeting to  discuss plan. Spouse desires patient to return home at discharge.  Patient/Family's Understanding of and Emotional Response to Diagnosis, Current Treatment, and Prognosis:  Family has good understanding of patient condition and desires him to return home. CSW will advise RNCM of same.   Emotional Assessment Appearance:  Appears stated age Attitude/Demeanor/Rapport:  Unable to Assess, Other(Asleep) Affect (typically observed):  Unable to Assess Orientation:  Oriented to Self Alcohol / Substance use:  Not Applicable Psych involvement (Current and /or in the community):  No (Comment)  Discharge Needs  Concerns to be addressed:  Discharge Planning Concerns Readmission within the last 30 days:  No Current discharge risk:  Dependent with Mobility Barriers to Discharge:  No Barriers Identified   Frank Baxter, LCSW 02/11/2018, 4:11 PM

## 2018-02-11 NOTE — Care Management Note (Signed)
Case Management Note  Patient Details  Name: Frank Simpson MRN: [id number] Date of Birth: 05/11/1931  Subjective/Objective:   Chest pain, shortness of breath                 Action/Plan: Please see previous NCM notes. Pt active with [facility] for HHPT. Pt may benefit from [facility]. Will need resumption of care for HHPT and order with [facility] with F2F added. Message sent to attending. Wife declined SNF. Will have NCM follow up with [facility] on 02/12/2018 to add [facility] to services.   Expected Discharge Date:               Expected Discharge Plan:  [place]  In-House Referral:  Clinical Social Work  Discharge planning Services  CM Consult  Post Acute Care Choice:  Home Health Choice offered to:  Patient, Spouse  DME Arranged:    DME Agency:     HH Arranged:  PT, RN, declined SNF HH Agency:  [place]  Status of Service:  In process, will continue to follow  If discussed at Long Length of Stay Meetings, dates discussed:    Additional Comments:  Erenest Rasher, RN 02/11/2018, 4:08 PM

## 2018-02-11 NOTE — Progress Notes (Signed)
Progress Note  Patient Name: Frank Simpson Date of Encounter: 02/11/2018  Primary Cardiologist:   Lauree Chandler, MD   Subjective   He is very weak but he denies any chest pain or SOB.   Inpatient Medications    Scheduled Meds: . amLODipine  5 mg Oral Daily  . aspirin EC  325 mg Oral Daily  . carvedilol  6.25 mg Oral BID WC  . Chlorhexidine Gluconate Cloth  6 each Topical Q0600  . fluticasone  1 spray Each Nare BID  . furosemide  40 mg Oral BID  . heparin injection (subcutaneous)  5,000 Units Subcutaneous Q8H  . hydrALAZINE  25 mg Oral Q8H  . insulin aspart  0-9 Units Subcutaneous Q4H  . isosorbide mononitrate  60 mg Oral Daily  . mupirocin ointment  1 application Nasal BID  . rosuvastatin  20 mg Oral Daily  . sertraline  25 mg Oral Daily  . sodium chloride flush  3 mL Intravenous Q12H  . tamsulosin  0.4 mg Oral Daily   Continuous Infusions: . sodium chloride     PRN Meds: sodium chloride, acetaminophen **OR** acetaminophen, hydrALAZINE, HYDROcodone-acetaminophen, Melatonin, sodium chloride flush   Vital Signs    Vitals:   02/10/18 1432 02/10/18 2054 02/11/18 0417 02/11/18 0419  BP: (!) 120/56 127/74 135/76   Pulse: 65 67 67   Resp: 20 (!) 22 (!) 21   Temp: 98 F (36.7 C) 98.7 F (37.1 C) (!) 97.5 F (36.4 C)   TempSrc: Axillary Oral Oral   SpO2: 94% 98% 93%   Weight:    213 lb 6.5 oz (96.8 kg)  Height:        Intake/Output Summary (Last 24 hours) at 02/11/2018 0942 Last data filed at 02/11/2018 0429 Gross per 24 hour  Intake -  Output 1325 ml  Net -1325 ml   Filed Weights   02/09/18 0330 02/10/18 0332 02/11/18 0419  Weight: 222 lb 10.6 oz (101 kg) 215 lb 6.2 oz (97.7 kg) 213 lb 6.5 oz (96.8 kg)    Telemetry    AV demand paced rhythm - Personally Reviewed  ECG    NA - Personally Reviewed  Physical Exam   GEN: No  acute distress.   Neck: No  JVD Cardiac: RRR, no murmurs, rubs, or gallops.  Respiratory:    Decreased breath sounds  without wheezing or crackles. GI: Soft, nontender, non-distended, normal bowel sounds  MS:  Trace edema; No deformity. Neuro:   Nonfocal  Psych: Oriented and appropriate    Labs    Chemistry Recent Labs  Lab 02/08/18 2112 02/09/18 0347 02/10/18 0355 02/11/18 0446  NA 144 143 144 142  K 3.7 3.8 3.5 3.4*  CL 105 106 103 102  CO2 _0 GLUCOSE 148* 86 91 170*  BUN 24* 24* 24* 28*  CREATININE 1.71* 1.69* 1.79* 1.84*  CALCIUM 8.3* 8.5* 8.8* 8.8*  PROT 6.7  --   --   --   ALBUMIN 3.2*  --   --   --   AST 23  --   --   --   ALT 14*  --   --   --   ALKPHOS 69  --   --   --   BILITOT 0.9  --   --   --   GFRNONAA 35* 35* 33* 32*  GFRAA 40* 41* 38* 37*  ANIONGAP 11 12 16* 14     Hematology Recent Labs  Lab 02/07/18  1942 02/08/18 0634 02/09/18 0347  WBC 8.8 8.1 6.4  RBC 4.81 4.24 4.43  HGB 14.2 12.3* 12.9*  HCT 44.6 38.9* 40.8  MCV 92.7 91.7 92.1  MCH 29.5 29.0 29.1  MCHC 31.8 31.6 31.6  RDW 15.6* 15.6* 15.7*  PLT 208 192 180    Cardiac Enzymes Recent Labs  Lab 02/08/18 1054 02/08/18 1517 02/08/18 2112  TROPONINI 0.33* 0.34* 0.31*    Recent Labs  Lab 02/07/18 1948  TROPIPOC 0.42*     BNP Recent Labs  Lab 02/07/18 2157  BNP 1,054.6*     DDimer  Recent Labs  Lab 02/07/18 1941  DDIMER 3.02*     Radiology    No results found.  Cardiac Studies   Echo 02/09/18 Study Conclusions  - Left ventricle: The cavity size was normal. Wall thickness was increased in a pattern of moderate LVH. Systolic function was severely reduced. The estimated ejection fraction was in the range of 20% to 25%. Diffuse hypokinesis. Features are consistent with a pseudonormal left ventricular filling pattern, with concomitant abnormal relaxation and increased filling pressure (grade 2 diastolic dysfunction). Doppler parameters are consistent with elevated ventricular end-diastolic filling pressure. - Aortic valve: There was mild  regurgitation. - Mitral valve: There was mild regurgitation.  Impressions:  - When compared to the prior study on 11/10/2016 LVEF has significantly decreased from 60-65% to 20-25% with diffuse hypokinesis and akinesis in the mid and apical segments. There is no obvious apical thrombus, but Definity echocontrast images should be done to confirm.    Patient Profile     84 y.o. male with PMH CAD s/p PCI to mid LAD, chronic diastolic CHF, complete heart block s/p pacemaker, HTN, HLD, DM, CKD stage 3,who is being seen for dyspnea and chest pain.    Assessment & Plan    Acute systolic HF:   PPM device suggested atrial tachycardia.  Device was reprogrammed in the ED.  Question whether this could be related to the acute decreased in EF.  Changed to PO Lasix yesterday.  Down 5.4 liters since admission.  Might plan to try to keep him about even now. I increased the Coreg yesterday and I will continue with the current meds.  Also using hydral/nitrates without ARB or ACE inhibitor.   Elevated troponin:   Medical management as above.   CHB:  Atrial tach with device reprogrammed this admission.   CKD III:  Creat is relatively stable again today.   For questions or updates, please contact [place] Please consult [email or web address] for contact info under Cardiology/STEMI.   Signed, Minus Breeding, MD  02/11/2018, 9:42 AM

## 2018-02-12 DIAGNOSIS — E1121 Type 2 diabetes mellitus with diabetic nephropathy: Secondary | ICD-10-CM

## 2018-02-12 LAB — BASIC METABOLIC PANEL
ANION GAP: 11 (ref 5–15)
BUN: 31 mg/dL — AB (ref 6–20)
CO2: 28 mmol/L (ref 22–32)
Calcium: 8.7 mg/dL — ABNORMAL LOW (ref 8.9–10.3)
Chloride: 107 mmol/L (ref 101–111)
Creatinine, Ser: 2.12 mg/dL — ABNORMAL HIGH (ref 0.61–1.24)
GFR calc Af Amer: 31 mL/min — ABNORMAL LOW (ref >60)
GFR, EST NON AFRICAN AMERICAN: 27 mL/min — AB (ref >60)
Glucose, Bld: 182 mg/dL — ABNORMAL HIGH (ref 65–99)
POTASSIUM: 3.5 mmol/L (ref 3.5–5.1)
SODIUM: 146 mmol/L — AB (ref 135–145)

## 2018-02-12 LAB — GLUCOSE, CAPILLARY
GLUCOSE-CAPILLARY: 177 mg/dL — AB (ref 65–99)
GLUCOSE-CAPILLARY: 169 mg/dL — AB (ref 65–99)
GLUCOSE-CAPILLARY: 344 mg/dL — AB (ref 65–99)
GLUCOSE-CAPILLARY: 312 mg/dL — AB (ref 65–99)
Glucose-Capillary: 201 mg/dL — ABNORMAL HIGH (ref 65–99)
Glucose-Capillary: 226 mg/dL — ABNORMAL HIGH (ref 65–99)
Glucose-Capillary: 227 mg/dL — ABNORMAL HIGH (ref 65–99)
Glucose-Capillary: 344 mg/dL — ABNORMAL HIGH (ref 65–99)

## 2018-02-12 MED ORDER — ASPIRIN EC 81 MG PO TBEC
81.0000 mg | DELAYED_RELEASE_TABLET | Freq: Every day | ORAL | Status: DC
  Administered 2018-02-13: 81 mg via ORAL
  Filled 2018-02-12: qty 1

## 2018-02-12 MED ORDER — INSULIN DETEMIR 100 UNIT/ML ~~LOC~~ SOLN
5.0000 [IU] | Freq: Every day | SUBCUTANEOUS | Status: DC
  Administered 2018-02-12: 5 [IU] via SUBCUTANEOUS
  Filled 2018-02-12 (×2): qty 0.05

## 2018-02-12 NOTE — Progress Notes (Signed)
Inpatient Diabetes Program Recommendations  AACE/ADA: New Consensus Statement on Inpatient Glycemic Control (2015)  Target Ranges:  Prepandial:   less than 140 mg/dL      Peak postprandial:   less than 180 mg/dL (1-2 hours)      Critically ill patients:  140 - 180 mg/dL   Results for LUCERO, IDE (MRN [id number]) as of 02/12/2018 11:06  Ref. Range 02/11/2018 04:14 02/11/2018 08:44 02/11/2018 11:32 02/11/2018 17:04 02/11/2018 20:08  Glucose-Capillary Latest Ref Range: 65 - 99 mg/dL 185 (H)  2 units Novolog  180 (H)  2 units Novolog 276 (H)  5 units Novolog 236 (H)  3 units Novolog 318 (H)  7 units Novolog   Results for JASEN, HARTSTEIN (MRN [id number]) as of 02/12/2018 11:06  Ref. Range 02/12/2018 00:07 02/12/2018 04:24 02/12/2018 07:24  Glucose-Capillary Latest Ref Range: 65 - 99 mg/dL 201 (H)  3 units Novolog 177 (H)  2 units Novolog 169 (H)  2 units Novolog    Home DM Meds: Basaglar 35 units BID        Humalog 3 units TID  Current Orders: Novolog Sensitive Correction Scale/ SSI (0-9 units) Q4 hours       MD- Please consider the following in-hospital insulin adjustments:  1. Change Novolog SSI to TID AC + HS  2. Start Novolog Meal Coverage: Novolog 3 units TID with meals (hold if pt eats <50% of meal)  3. Start Lantus 8 units BID (25% total home dose)     --Will follow patient during hospitalization--  Wyn Quaker RN, MSN, CDE Diabetes Coordinator Inpatient Glycemic Control Team Team Pager: [phone] (8a-5p)

## 2018-02-12 NOTE — Progress Notes (Signed)
Pt's husband declined SNF placement, states she will take pt home with [facility]. Referral called to in house rep. Orders in to resume HHRN/PT.

## 2018-02-12 NOTE — Progress Notes (Signed)
Progress Note  Patient Name: Frank Simpson Date of Encounter: 02/12/2018  Primary Cardiologist:   Lauree Chandler, MD   Subjective   Lethargic but denies chest pain or SOB  Inpatient Medications    Scheduled Meds: . amLODipine  5 mg Oral Daily  . aspirin EC  325 mg Oral Daily  . carvedilol  6.25 mg Oral BID WC  . Chlorhexidine Gluconate Cloth  6 each Topical Q0600  . fluticasone  1 spray Each Nare BID  . heparin injection (subcutaneous)  5,000 Units Subcutaneous Q8H  . hydrALAZINE  25 mg Oral Q8H  . insulin aspart  0-9 Units Subcutaneous Q4H  . isosorbide mononitrate  60 mg Oral Daily  . mupirocin ointment  1 application Nasal BID  . rosuvastatin  20 mg Oral Daily  . sertraline  25 mg Oral Daily  . sodium chloride flush  3 mL Intravenous Q12H  . tamsulosin  0.4 mg Oral Daily   Continuous Infusions: . sodium chloride     PRN Meds: sodium chloride, acetaminophen **OR** acetaminophen, hydrALAZINE, HYDROcodone-acetaminophen, Melatonin, sodium chloride flush   Vital Signs    Vitals:   02/11/18 0419 02/11/18 1441 02/11/18 2011 02/12/18 0428  BP:  (!) 147/75 118/61 134/71  Pulse:  69 70 66  Resp:  (!) _0 Temp:  99 F (37.2 C) 99 F (37.2 C) 98.4 F (36.9 C)  TempSrc:  Axillary Axillary Oral  SpO2:  96% 97% 96%  Weight: 213 lb 6.5 oz (96.8 kg)   214 lb 8.1 oz (97.3 kg)  Height:        Intake/Output Summary (Last 24 hours) at 02/12/2018 1136 Last data filed at 02/12/2018 0600 Gross per 24 hour  Intake 0 ml  Output 300 ml  Net -300 ml   Filed Weights   02/10/18 0332 02/11/18 0419 02/12/18 0428  Weight: 215 lb 6.2 oz (97.7 kg) 213 lb 6.5 oz (96.8 kg) 214 lb 8.1 oz (97.3 kg)    Telemetry    AV demand pacing - Personally Reviewed  ECG    NA - Personally Reviewed  Physical Exam   GEN: No  acute distress.   Neck: No  JVD Cardiac: RRR, no murmurs, rubs, or gallops.  Respiratory: Clear   to auscultation bilaterally. GI: Soft, nontender,  non-distended, normal bowel sounds  MS:  No edema; No deformity. Neuro:   Nonfocal  Psych: Oriented and appropriate   Labs    Chemistry Recent Labs  Lab 02/08/18 2112  02/10/18 0355 02/11/18 0446 02/12/18 0413  NA 144   < > 144 142 146*  K 3.7   < > 3.5 3.4* 3.5  CL 105   < > 103 102 107  CO2 28   < > _1 GLUCOSE 148*   < > 91 170* 182*  BUN 24*   < > 24* 28* 31*  CREATININE 1.71*   < > 1.79* 1.84* 2.12*  CALCIUM 8.3*   < > 8.8* 8.8* 8.7*  PROT 6.7  --   --   --   --   ALBUMIN 3.2*  --   --   --   --   AST 23  --   --   --   --   ALT 14*  --   --   --   --   ALKPHOS 69  --   --   --   --   BILITOT 0.9  --   --   --   --  GFRNONAA 35*   < > 33* 32* 27*  GFRAA 40*   < > 38* 37* 31*  ANIONGAP 11   < > 16* 14 11   < > = values in this interval not displayed.     Hematology Recent Labs  Lab 02/07/18 1942 02/08/18 0634 02/09/18 0347  WBC 8.8 8.1 6.4  RBC 4.81 4.24 4.43  HGB 14.2 12.3* 12.9*  HCT 44.6 38.9* 40.8  MCV 92.7 91.7 92.1  MCH 29.5 29.0 29.1  MCHC 31.8 31.6 31.6  RDW 15.6* 15.6* 15.7*  PLT 208 192 180    Cardiac Enzymes Recent Labs  Lab 02/08/18 1054 02/08/18 1517 02/08/18 2112  TROPONINI 0.33* 0.34* 0.31*    Recent Labs  Lab 02/07/18 1948  TROPIPOC 0.42*     BNP Recent Labs  Lab 02/07/18 2157  BNP 1,054.6*     DDimer  Recent Labs  Lab 02/07/18 1941  DDIMER 3.02*     Radiology    No results found.  Cardiac Studies   Echo 02/09/18 Study Conclusions  - Left ventricle: The cavity size was normal. Wall thickness was increased in a pattern of moderate LVH. Systolic function was severely reduced. The estimated ejection fraction was in the range of 20% to 25%. Diffuse hypokinesis. Features are consistent with a pseudonormal left ventricular filling pattern, with concomitant abnormal relaxation and increased filling pressure (grade 2 diastolic dysfunction). Doppler parameters are consistent with elevated  ventricular end-diastolic filling pressure. - Aortic valve: There was mild regurgitation. - Mitral valve: There was mild regurgitation.  Impressions:  - When compared to the prior study on 11/10/2016 LVEF has significantly decreased from 60-65% to 20-25% with diffuse hypokinesis and akinesis in the mid and apical segments. There is no obvious apical thrombus, but Definity echocontrast images should be done to confirm.    Patient Profile     84 y.o. male with PMH CAD s/p PCI to mid LAD, chronic diastolic CHF, complete heart block s/p pacemaker, HTN, HLD, DM, CKD stage 3,who is being seen for dyspnea and chest pain.    Assessment & Plan    Acute systolic HF:   PPM device suggested atrial tachycardia.  Device was reprogrammed in the ED.  Question whether this could be related to the acute decreased in EF.  Down 5.6 liters since admission.  I will hold the PO Lasix today since the creat is increased.   Elevated troponin:    Conservative therapy.  Medical management.    CHB:  Atrial tach with device reprogrammed this admission.   No change in therapy.    CKD III:  Creat is up sightly.  Hold Lasix as above.    relatively stable again today.   For questions or updates, please contact [place] Please consult [email or web address] for contact info under Cardiology/STEMI.   Signed, Minus Breeding, MD  02/12/2018, 11:36 AM

## 2018-02-12 NOTE — Care Management Important Message (Addendum)
Important Message  Patient Details IM Letter given to Cookie/Case Manager to present to the Patient Name: Frank Simpson MRN: [id number] Date of Birth: [birth year]   Medicare Important Message Given:  Yes    Kerin Salen 02/12/2018, 11:34 AMImportant Message  Patient Details  Name: Frank Simpson MRN: [id number] Date of Birth: May 06, 1931   Medicare Important Message Given:  Yes    Kerin Salen 02/12/2018, 11:34 AM

## 2018-02-12 NOTE — Progress Notes (Signed)
  Speech Language Pathology Treatment: Dysphagia  Patient Details Name: Frank Simpson MRN: [id number] DOB: 16-Feb-1931 Today's Date: 02/12/2018 Time: 3790-2409 SLP Time Calculation (min) (ACUTE ONLY): 20 min  Assessment / Plan / Recommendation Clinical Impression  Pt with ongoing s/s of aspiration with thin liquid even via teaspoon - suspect due to premature spillage into airway (based on prior MBS).   Delayed swallow also observed with pt requiring SLP to verbally cue him - which was effective.  Ongoing impulsivity noted resulting in pt "chugging" nectar liquids via cup - post swallow coughing - Small boluses of nectar and single ice chips tolerated well.   Pt's cough is weak and recommend ongoing strict aspiration precautions. No family present at this time.   Recommend pt continue dys3/nectar diet and allow him to have ice chips *single.  Follow up SLP recommended for home - with consideration for oral suctioning to be set up.  Educated pt to plan and he is agreeable.[name] Kitchen   HPI HPI: Pt is an 84 y.o. male presenting to hospital with chest pain and SOB. PMH: CAD, CHF, HTN, DM-2, CKD stage 3. Patient c/o several days of worsening dyspnea associated with orthopnea, lower extremity edema, dry cough and chest pain. Chest CT on 3/13 revealed small bilateral effusions, cardiomegaly and suspect mild to moderate CHF.  Pt has had a prior MBS 11/2017 with overall functional swallow. He does admit to this SLP to long term h/o frequent coughing with intake - most notably liquids that has not improved.   Note pt is a full code.         SLP Plan  Continue with current plan of care       Recommendations  Diet recommendations: Dysphagia 3 (mechanical soft);Nectar-thick liquid;Other(comment)(ice chips ok  - single) Medication Administration: Whole meds with puree Supervision: Full supervision/cueing for compensatory strategies Compensations: Minimize environmental distractions;Slow rate;Small sips/bites;Other  (Comment)(Patient is impulsive and takes large sips, use puree to help clear solids from mouth) Postural Changes and/or Swallow Maneuvers: Seated upright 90 degrees;Upright 30-60 min after meal                Oral Care Recommendations: Oral care BID Follow up Recommendations: Other (comment)(TBD) SLP Visit Diagnosis: Dysphagia, oropharyngeal phase (R13.12) Plan: Continue with current plan of care       GO              Frank Simpson, [facility] SLP [place], [place] 02/12/2018, 11:35 AM

## 2018-02-12 NOTE — Progress Notes (Signed)
PROGRESS NOTE    Frank Simpson  ZOX:[id number] DOB: 08-Feb-1931 DOA: 02/07/2018 PCP: [name], MD    Brief Narrative:  84 year old male who presented to chest painandshortness of breath. He does have a significant past medical history for coronary artery disease, congestive heart failure, chronic kidney disease stage III, hypertension, dyslipidemia, andtype 2 diabetes mellitus. Patient complained of several days of worsening dyspnea, associated with orthopnea, lower extremity edema,dry cough, and chest pain. On the initial physical examination blood pressure 106/84, heart rate 103, temperature 97.5, respiratory rate 22, oxygen saturation 95%.Positive JVD, lungs with rales bilaterally, no wheezing, heart S1-S2 present and rhythmic, no gallops, rubs or murmurs, abdomen soft nontender, positive lower extremity edema. Sodium 143, potassium 4.1, chloride 107, bicarb 25, glucose 137, BUN 39, creatinine 1.66, white count 8.8, hemoglobin 14.2, hematocrit 44.6, platelets 208, d-dimer 3.06, chest x-ray with increased seizure markings bilaterally, urinalysis with 150 glucose, 30 protein, specific gravity 1.017, white cell count 0-5, RBC 0-5,EKG atrial sense, V paced, pacing related left bundle branch block with left axis deviation.  Patient was admitted to the hospital with a working diagnosis of acute decompensationof chronic diastolic heart failure.   Assessment & Plan:   Principal Problem:   CHF (congestive heart failure) (HCC) Active Problems:   Tachycardia   ARF (acute renal failure) (HCC)   Elevated troponin   Acute CHF (congestive heart failure) ([place])   1.Acute on chronicsystolicheart failurecomplicated with acute cardiogenic pulmonary edema, (present on admission).  LV function with EF 20 to 25%, with diffuse hypokinesis. Improved volume status,, will hold on furosemide for now due to worsening renal function, will continue beta blockade and afterload reduction.    2.Acute hypoxicrespiratory failure. Continue on nasal canula 2 lpm with oxymetry at 96%, no significant dyspnea.   2.Chronic kidney disease stage 3.Worsening renal function with serum cr this am up to 2,12, K at 3,5 and serum bicarbonate at 28, suspected overdiuresis, will hold on furosemide for now and continue to avoid hypotension or nephrotoxic medications.  3.Type 2 diabetes mellitus. Continue with insulin sliding scale insulin for glucose cover and monitoring. Capillary glucose 318, 201, 177, 169, 344, will start patient on low dose levimir. Improved po intake.    4.Hypertension.Continue blood pressure control with amlodipine, hydralazine/ isosorbide and coreg, systolic blood pressure 811 to 130 mmHg.   5. Acute metabolic encephalopathy.Clinically has improved, will continue sertraline. Patient has been off mittens, will plan for home health services at discharge, patient's family declined snf, out of bed as tolerated.  6. Dyslipidemia. Continue with rosuvastatin.  DVT prophylaxis:enoxaparin Code Status:full Family Communication:no family at the bedside  Disposition Plan:Plan to discharge 03/19 if renal function improves, home with home health, patient's wife has declined snf.    Consultants:  Cardiology  Procedures:    Antimicrobials    Subjective: Patient is feeling better, still poor appetite, feeling weak and deconditioned, dyspnea continue to improve, patient has been off mittens.   Objective: Vitals:   02/11/18 1441 02/11/18 2011 02/12/18 0428 02/12/18 1209  BP: (!) 147/75 118/61 134/71 (!) 110/55  Pulse: 69 70 66 64  Resp: (!) _0 Temp: 99 F (37.2 C) 99 F (37.2 C) 98.4 F (36.9 C) 98.6 F (37 C)  TempSrc: Axillary Axillary Oral Axillary  SpO2: 96% 97% 96% 95%  Weight:   97.3 kg (214 lb 8.1 oz)   Height:        Intake/Output Summary (Last 24 hours) at 02/12/2018 1444 Last data filed at  02/12/2018 1300 Gross  per 24 hour  Intake 120 ml  Output 300 ml  Net -180 ml   Filed Weights   02/10/18 0332 02/11/18 0419 02/12/18 0428  Weight: 97.7 kg (215 lb 6.2 oz) 96.8 kg (213 lb 6.5 oz) 97.3 kg (214 lb 8.1 oz)    Examination:   General: Not in pain or dyspnea, deconditioned Neurology: Awake and alert, non focal  E ENT: mild pallor, no icterus, oral mucosa moist/ No JVD Cardiovascular: No JVD. S1-S2 present, rhythmic, no gallops, rubs, or murmurs. No lower extremity edema. Pulmonary: decreased breath sounds bilaterally, poor air movement, no wheezing, rhonchi or rales. Gastrointestinal. Abdomen protuberant, no organomegaly, non tender, no rebound or guarding Skin. No rashes Musculoskeletal: no joint deformities     Data Reviewed: I have personally reviewed following labs and imaging studies  CBC: Recent Labs  Lab 02/07/18 1942 02/08/18 0634 02/09/18 0347  WBC 8.8 8.1 6.4  HGB 14.2 12.3* 12.9*  HCT 44.6 38.9* 40.8  MCV 92.7 91.7 92.1  PLT 208 192 195   Basic Metabolic Panel: Recent Labs  Lab 02/08/18 2112 02/09/18 0347 02/10/18 0355 02/11/18 0446 02/12/18 0413  NA 144 143 144 142 146*  K 3.7 3.8 3.5 3.4* 3.5  CL 105 106 103 102 107  CO2 _0 GLUCOSE 148* 86 91 170* 182*  BUN 24* 24* 24* 28* 31*  CREATININE 1.71* 1.69* 1.79* 1.84* 2.12*  CALCIUM 8.3* 8.5* 8.8* 8.8* 8.7*   GFR: Estimated Creatinine Clearance: 30.3 mL/min (A) (by C-G formula based on SCr of 2.12 mg/dL (H)). Liver Function Tests: Recent Labs  Lab 02/08/18 2112  AST 23  ALT 14*  ALKPHOS 69  BILITOT 0.9  PROT 6.7  ALBUMIN 3.2*   No results for input(s): LIPASE, AMYLASE in the last 168 hours. No results for input(s): AMMONIA in the last 168 hours. Coagulation Profile: Recent Labs  Lab 02/07/18 2157  INR 1.10   Cardiac Enzymes: Recent Labs  Lab 02/08/18 1054 02/08/18 1517 02/08/18 2112  TROPONINI 0.33* 0.34* 0.31*   BNP (last 3 results) Recent Labs    03/07/17 1255  PROBNP  379.0*   HbA1C: No results for input(s): HGBA1C in the last 72 hours. CBG: Recent Labs  Lab 02/11/18 2008 02/12/18 0007 02/12/18 0424 02/12/18 0724 02/12/18 1201  GLUCAP 318* 201* 177* 169* 344*   Lipid Profile: No results for input(s): CHOL, HDL, LDLCALC, TRIG, CHOLHDL, LDLDIRECT in the last 72 hours. Thyroid Function Tests: No results for input(s): TSH, T4TOTAL, FREET4, T3FREE, THYROIDAB in the last 72 hours. Anemia Panel: No results for input(s): VITAMINB12, FOLATE, FERRITIN, TIBC, IRON, RETICCTPCT in the last 72 hours.    Radiology Studies: I have reviewed all of the imaging during this hospital visit personally     Scheduled Meds: . amLODipine  5 mg Oral Daily  . [START ON 02/13/2018] aspirin EC  81 mg Oral Daily  . carvedilol  6.25 mg Oral BID WC  . Chlorhexidine Gluconate Cloth  6 each Topical Q0600  . fluticasone  1 spray Each Nare BID  . heparin injection (subcutaneous)  5,000 Units Subcutaneous Q8H  . hydrALAZINE  25 mg Oral Q8H  . insulin aspart  0-9 Units Subcutaneous Q4H  . isosorbide mononitrate  60 mg Oral Daily  . mupirocin ointment  1 application Nasal BID  . rosuvastatin  20 mg Oral Daily  . sertraline  25 mg Oral Daily  . sodium chloride flush  3 mL Intravenous Q12H  .  tamsulosin  0.4 mg Oral Daily   Continuous Infusions: . sodium chloride       LOS: 5 days        Mauricio Gerome Apley, MD [facility] Pager [phone]

## 2018-02-13 DIAGNOSIS — I5042 Chronic combined systolic (congestive) and diastolic (congestive) heart failure: Secondary | ICD-10-CM

## 2018-02-13 DIAGNOSIS — N179 Acute kidney failure, unspecified: Secondary | ICD-10-CM

## 2018-02-13 LAB — BASIC METABOLIC PANEL
Anion gap: 12 (ref 5–15)
BUN: 34 mg/dL — AB (ref 6–20)
CO2: 29 mmol/L (ref 22–32)
CREATININE: 1.99 mg/dL — AB (ref 0.61–1.24)
Calcium: 8.8 mg/dL — ABNORMAL LOW (ref 8.9–10.3)
Chloride: 105 mmol/L (ref 101–111)
GFR, EST AFRICAN AMERICAN: 34 mL/min — AB (ref >60)
GFR, EST NON AFRICAN AMERICAN: 29 mL/min — AB (ref >60)
Glucose, Bld: 140 mg/dL — ABNORMAL HIGH (ref 65–99)
Potassium: 3.7 mmol/L (ref 3.5–5.1)
SODIUM: 146 mmol/L — AB (ref 135–145)

## 2018-02-13 LAB — GLUCOSE, CAPILLARY
GLUCOSE-CAPILLARY: 138 mg/dL — AB (ref 65–99)
GLUCOSE-CAPILLARY: 147 mg/dL — AB (ref 65–99)
Glucose-Capillary: 295 mg/dL — ABNORMAL HIGH (ref 65–99)
Glucose-Capillary: 387 mg/dL — ABNORMAL HIGH (ref 65–99)

## 2018-02-13 MED ORDER — ISOSORBIDE MONONITRATE ER 60 MG PO TB24: 60.0000 mg | ORAL_TABLET | Freq: Every day | ORAL | 0 refills | Status: DC

## 2018-02-13 MED ORDER — FUROSEMIDE 40 MG PO TABS: 40.0000 mg | ORAL_TABLET | Freq: Every day | ORAL | 0 refills | Status: DC

## 2018-02-13 MED ORDER — HYDRALAZINE HCL 25 MG PO TABS: 25.0000 mg | ORAL_TABLET | Freq: Three times a day (TID) | ORAL | 0 refills | Status: DC

## 2018-02-13 MED ORDER — CARVEDILOL 6.25 MG PO TABS: 6.2500 mg | ORAL_TABLET | Freq: Two times a day (BID) | ORAL | 0 refills | Status: DC

## 2018-02-13 NOTE — Discharge Summary (Signed)
Physician Discharge Summary  Frank Simpson  NUU:[id number]  DOB: 12-06-1930  DOA: 02/07/2018 PCP: [name], MD  Admit date: 02/07/2018 Discharge date: 02/13/2018  Admitted From: Home  Disposition: Home   Recommendations for Outpatient Follow-up:  1. Follow up with PCP in 1 week.  2. Follow up with cardiology in 2 weeks 3. Repeat echocardiogram in 3 months 4. Please obtain BMP/CBC in one week to monitor hemoglobin and renal function  Home Health: PT/OT/RN/Aid/SW Equipment/Devices: Walker  Discharge Condition: Stable  CODE STATUS: Full Code  Diet recommendation: Heart Healthy / Carb Modified  Brief/Interim Summary: For full details see H&P/Progress note, but in brief, Frank Simpson is a 84 year old male with past medical history of coronary artery disease s/p PCI, CHF, CKD stage III, hypertension, type 2 diabetes mellitus, complete heart block status post pacemaker who presented to the emergency department complaining of shortness of breath and chest pain.  Upon ED evaluation patient was found to be hypotensive with tachycardia, positive JVD bilateral rails and lower extremity edema.  Chest x-ray increasing vascular markings bilaterally and EKG with ventricular paced and left bundle branch block with left axis deviation.  Patient was admitted with working diagnosis of acute decompensation of chronic diastolic heart failure.  Echocardiogram shows LV function with EF of 20-25% with diffuse hypokinesis.  Cardiology was consulted and patient was started on aggressive diuresis.  PPM device suggested atrial tachycardia and device was reprogrammed in the ED probably causing acute decrease in EF.  Cardiology recommended conservative medical management.  Patient has significantly improved with > 6 L down since admission and 22 pounds less from admissions.  Patient was on oxygen supplementation which was weaned off to room air and saturating above 92%.  Patient has clinically improved and was  deemed stable for discharge.  Physical therapy evaluated patient and recommended SNF for rehabilitation although family has refused and wishes to take patient home with home health.  Patient will be discharged home with outpatient follow-up  Subjective: Patient seen and examined, report feeling much better.  Denies chest pain, shortness of breath and palpitation. Still feeling weak.  Discharge Diagnoses/Hospital Course:  Acute on chronic systolic and diastolic heart failure Felt to be related to PPM malfunction which was re-programmed in the ED. Echo with EF of 20-25% with diffuse hypokinesis Volume status has improved with aggressive diuresis, weight is down 22 pounds and fluid balance is negative ~6 L. Cardiology was consulted did not recommend ischemic workup at this time, but recommending medical management.  Patient on beta-blocker, no ARB or Entresto due to renal function.  Lasix was on hold due to slight increase in creatinine, can resume in a.m. with Lasix 40 mg daily.  Acute hypoxic respiratory failure Due to pulmonary edema from CHF exacerbation Patient has successfully come off of oxygen supplementation.  CKD stage III Renal function worsened during hospital stay felt to be secondary to overdiuresis, Lasix were held and creatinine has plateaued.  Will resume Lasix 40 mg daily in a.m., monitor creatinine in 1 week with PCP.  Type 2 diabetes mellitus CBGs labile during hospital stay given discontinuation of home insulin regimen Will resume insulin at home dose and follow-up with PCP  Hypertension BP well controlled during hospital stay Patient on amlodipine if EF does not improve in the next 58-monthneed to discontinue amlodipine will continue for now. Continue hydralazine/isosorbide and Coreg Follow-up with cardiology  Acute metabolic encephalopathy Resolved, per spouse patient mentally at baseline.  Severe deconditioning Family declined SNF for  rehab, will set home  health  All other chronic medical condition were stable during the hospitalization.  Patient was seen by physical therapy, recommending SNF but family refuses On the day of the discharge the patient's vitals were stable, and no other acute medical condition were reported by patient. the patient was felt safe to be discharge to home.   Discharge Instructions  You were cared for by a hospitalist during your hospital stay. If you have any questions about your discharge medications or the care you received while you were in the hospital after you are discharged, you can call the unit and asked to speak with the hospitalist on call if the hospitalist that took care of you is not available. Once you are discharged, your primary care physician will handle any further medical issues. Please note that NO REFILLS for any discharge medications will be authorized once you are discharged, as it is imperative that you return to your primary care physician (or establish a relationship with a primary care physician if you do not have one) for your aftercare needs so that they can reassess your need for medications and monitor your lab values.  Discharge Instructions    (HEART FAILURE PATIENTS) Call MD:  Anytime you have any of the following symptoms: 1) 3 pound weight gain in 24 hours or 5 pounds in 1 week 2) shortness of breath, with or without a dry hacking cough 3) swelling in the hands, feet or stomach 4) if you have to sleep on extra pillows at night in order to breathe.   Complete by:  As directed    Call MD for:  difficulty breathing, headache or visual disturbances   Complete by:  As directed    Call MD for:  extreme fatigue   Complete by:  As directed    Call MD for:  hives   Complete by:  As directed    Call MD for:  persistant dizziness or light-headedness   Complete by:  As directed    Call MD for:  persistant nausea and vomiting   Complete by:  As directed    Call MD for:  redness, tenderness, or  signs of infection (pain, swelling, redness, odor or green/yellow discharge around incision site)   Complete by:  As directed    Call MD for:  severe uncontrolled pain   Complete by:  As directed    Call MD for:  temperature >100.4   Complete by:  As directed    Diet - low sodium heart healthy   Complete by:  As directed    Discharge instructions   Complete by:  As directed    Follow up with PCP in 1 week.  Follow up with cardiology in 2 weeks Repeat echocardiogram in 3 months Please obtain BMP/CBC in one week to monitor hemoglobin and renal function   Increase activity slowly   Complete by:  As directed      Allergies as of 02/13/2018      Reactions   Metformin And Related Other (See Comments)   Affected kidneys   Oxycodone Other (See Comments)   Severe confusion   Januvia [sitagliptin Phosphate] Other (See Comments)   Possibly affected kidneys?   Codeine    Penicillins Other (See Comments)   Reaction long time ago - doesn't remember      Medication List    TAKE these medications   acetaminophen 500 MG tablet Commonly known as:  TYLENOL Take 1 tablet (500 mg total)  by mouth every 6 (six) hours as needed for mild pain.   amLODipine 5 MG tablet Commonly known as:  NORVASC Take 1 tablet (5 mg total) by mouth daily.   aspirin EC 325 MG tablet Take 1 tablet (325 mg total) by mouth daily.   BASAGLAR KWIKPEN 100 UNIT/ML Sopn Inject 0.35 mLs (35 Units total) into the skin 2 (two) times daily.   carvedilol 6.25 MG tablet Commonly known as:  COREG Take 1 tablet (6.25 mg total) by mouth 2 (two) times daily with a meal.   fluticasone 50 MCG/ACT nasal spray Commonly known as:  FLONASE Place 1 spray into both nostrils 2 (two) times daily.   furosemide 40 MG tablet Commonly known as:  LASIX Take 1 tablet (40 mg total) by mouth daily. Start taking on:  02/14/2018   glucose blood test strip Use as instructed   hydrALAZINE 25 MG tablet Commonly known as:   APRESOLINE Take 1 tablet (25 mg total) by mouth every 8 (eight) hours.   HYDROcodone-acetaminophen 5-325 MG tablet Commonly known as:  NORCO/VICODIN Take 1 tablet by mouth every 6 (six) hours as needed for moderate pain. #42 doses   insulin lispro 100 UNIT/ML KiwkPen Commonly known as:  HUMALOG KWIKPEN INJECT 3 UNITS  TID with meals   Insulin Pen Needle 32G X 4 MM Misc 1 each by Does not apply route 4 (four) times daily.   isosorbide mononitrate 60 MG 24 hr tablet Commonly known as:  IMDUR Take 1 tablet (60 mg total) by mouth daily. What changed:    medication strength  how much to take   Melatonin 5 MG Tabs Take 1 tablet (5 mg total) by mouth at bedtime as needed (sleep).   onetouch ultrasoft lancets Use as instructed   rosuvastatin 20 MG tablet Commonly known as:  CRESTOR Take 1 tablet (20 mg total) by mouth daily.   sertraline 25 MG tablet Commonly known as:  ZOLOFT Take 1 tablet (25 mg total) by mouth daily.   tamsulosin 0.4 MG Caps capsule Commonly known as:  FLOMAX Take 1 capsule (0.4 mg total) by mouth daily.      Follow-up Information    [place] [place] Follow up.   Specialty:  Home Health Services Why:  Home Health RN, and Physical Therapy- agency will call to arrange initial visit Contact information: [place] [address] [phone]        [name], MD. Schedule an appointment as soon as possible for a visit in 1 week(s).   Specialty:  Family Medicine Why:  Hospital follow-up Contact information: [place] [address] [phone]        Burnell Blanks, MD. Schedule an appointment as soon as possible for a visit in 2 week(s).   Specialty:  Cardiology Why:  Hospital follow-up Contact information: [place] [address] [place] [address] [phone]          Allergies  Allergen Reactions  . Metformin And Related Other (See Comments)     Affected kidneys  . Oxycodone Other (See Comments)    Severe confusion  . Januvia [Sitagliptin Phosphate] Other (See Comments)    Possibly affected kidneys?  . Codeine   . Penicillins Other (See Comments)    Reaction long time ago - doesn't remember    Consultations: Cardiology   Procedures/Studies: Dg Chest 2 View  Result Date: 02/07/2018 CLINICAL DATA:  Shortness of breath, chest pain EXAM:  CHEST - 2 VIEW COMPARISON:  01/04/2018 FINDINGS: Left pacer in place with leads in the right atrium and right ventricle. Cardiomegaly with vascular congestion and mild interstitial and alveolar opacities bilaterally, likely edema/CHF. Small bilateral effusions. IMPRESSION: Cardiomegaly, mild to moderate CHF. Small bilateral effusions. Electronically Signed   By: Rolm Baptise M.D.   On: 02/07/2018 18:12   Nm Pulmonary Perf And Vent  Result Date: 02/08/2018 CLINICAL DATA:  Shortness of breath and chest pain EXAM: NUCLEAR MEDICINE VENTILATION - PERFUSION LUNG SCAN VIEWS: Anterior, posterior, left lateral, right lateral, RPO, LPO, RAO, LAO-ventilation and perfusion RADIOPHARMACEUTICALS:  32.8 mCi of Tc-8mDTPA aerosol inhalation and 4.4 mCi Tc923mAA IV COMPARISON:  Chest radiograph February 07, 2018 FINDINGS: Ventilation: There is somewhat less ventilation on the left fairly diffusely compared to the right. There is no segmental appearing ventilation defect. Perfusion: Radiotracer uptake on the perfusion study is homogeneous and symmetric bilaterally. No appreciable perfusion defect. There is perfusion on the left in areas of decreased ventilation. IMPRESSION: No appreciable perfusion defects. This study constitutes a very low probability of pulmonary embolus. Ventilation defects noted on the left, probably due to a degree of pleural effusion. Electronically Signed   By: WiLowella GripII M.D.   On: 02/08/2018 16:33   Xr Hip Unilat W Or W/o Pelvis 1v Right  Result Date: 01/18/2018 Radiographs of the  right hip show some consolidation at the intertrochanteric fracture.  The hardware is stable with no complicating features.  ECHO 02/09/18 Impressions:  - When compared to the prior study on 11/10/2016 LVEF has   significantly decreased from 60-65% to 20-25% with diffuse   hypokinesis and akinesis in the mid and apical segments. There is   no obvious apical thrombus, but Definity echocontrast images   should be done to confirm.   Discharge Exam: Vitals:   02/12/18 2050 02/13/18 0436  BP: 129/69 131/74  Pulse: 65 61  Resp: 20 16  Temp: 98 F (36.7 C) (!) 97 F (36.1 C)  SpO2: 97% 99%   Vitals:   02/12/18 1209 02/12/18 2050 02/13/18 0436 02/13/18 0437  BP: (!) 110/55 129/69 131/74   Pulse: 64 65 61   Resp:  20 16   Temp: 98.6 F (37 C) 98 F (36.7 C) (!) 97 F (36.1 C)   TempSrc: Axillary Oral Axillary   SpO2: 95% 97% 99%   Weight:    97.1 kg (214 lb 1.1 oz)  Height:        General: Pt is alert, awake, not in acute distress Cardiovascular: S1V7O1systolic murmur,  Respiratory: Good air entry. Mild crackles at the R base Abdominal: Soft, NT, ND, bowel sounds + Extremities: Trace b/l LE edema    The results of significant diagnostics from this hospitalization (including imaging, microbiology, ancillary and laboratory) are listed below for reference.     Microbiology: Recent Results (from the past 240 hour(s))  MRSA PCR Screening     Status: Abnormal   Collection Time: 02/08/18  5:27 PM  Result Value Ref Range Status   MRSA by PCR POSITIVE (A) NEGATIVE Final    Comment:        The GeneXpert MRSA Assay (FDA approved for NASAL specimens only), is one component of a comprehensive MRSA colonization surveillance program. It is not intended to diagnose MRSA infection nor to guide or monitor treatment for MRSA infections. RESULT CALLED TO, READ BACK BY AND VERIFIED WITH: R DAVIS RN 2328 02/08/18 A NAVARRO Performed at [facility]  [place]  [address] [place], [place] [address]      Labs: BNP (last 3 results) Recent Labs    12/02/17 1823 02/07/18 2157  BNP 557.7* 0,093.8*   Basic Metabolic Panel: Recent Labs  Lab 02/09/18 0347 02/10/18 0355 02/11/18 0446 02/12/18 0413 02/13/18 0427  NA 143 144 142 146* 146*  K 3.8 3.5 3.4* 3.5 3.7  CL 106 103 102 107 105  CO2 _0 GLUCOSE 86 91 170* 182* 140*  BUN 24* 24* 28* 31* 34*  CREATININE 1.69* 1.79* 1.84* 2.12* 1.99*  CALCIUM 8.5* 8.8* 8.8* 8.7* 8.8*   Liver Function Tests: Recent Labs  Lab 02/08/18 2112  AST 23  ALT 14*  ALKPHOS 69  BILITOT 0.9  PROT 6.7  ALBUMIN 3.2*   No results for input(s): LIPASE, AMYLASE in the last 168 hours. No results for input(s): AMMONIA in the last 168 hours. CBC: Recent Labs  Lab 02/07/18 1942 02/08/18 0634 02/09/18 0347  WBC 8.8 8.1 6.4  HGB 14.2 12.3* 12.9*  HCT 44.6 38.9* 40.8  MCV 92.7 91.7 92.1  PLT 208 192 180   Cardiac Enzymes: Recent Labs  Lab 02/08/18 1054 02/08/18 1517 02/08/18 2112  TROPONINI 0.33* 0.34* 0.31*   BNP: Invalid input(s): POCBNP CBG: Recent Labs  Lab 02/12/18 2048 02/12/18 2336 02/13/18 0430 02/13/18 0739 02/13/18 1154  GLUCAP 344* 227* 138* 147* 295*   D-Dimer No results for input(s): DDIMER in the last 72 hours. Hgb A1c No results for input(s): HGBA1C in the last 72 hours. Lipid Profile No results for input(s): CHOL, HDL, LDLCALC, TRIG, CHOLHDL, LDLDIRECT in the last 72 hours. Thyroid function studies No results for input(s): TSH, T4TOTAL, T3FREE, THYROIDAB in the last 72 hours.  Invalid input(s): FREET3 Anemia work up No results for input(s): VITAMINB12, FOLATE, FERRITIN, TIBC, IRON, RETICCTPCT in the last 72 hours. Urinalysis    Component Value Date/Time   COLORURINE YELLOW 12/10/2017 1451   APPEARANCEUR CLEAR 12/10/2017 1451   LABSPEC 1.017 12/10/2017 1451   PHURINE 5.0 12/10/2017 1451   GLUCOSEU 150 (A) 12/10/2017 1451   HGBUR MODERATE (A) 12/10/2017  1451   BILIRUBINUR NEGATIVE 12/10/2017 1451   BILIRUBINUR neg 03/17/2017 1038   KETONESUR NEGATIVE 12/10/2017 1451   PROTEINUR 30 (A) 12/10/2017 1451   UROBILINOGEN 0.2 03/17/2017 1038   UROBILINOGEN 0.2 03/10/2015 1733   NITRITE NEGATIVE 12/10/2017 1451   LEUKOCYTESUR NEGATIVE 12/10/2017 1451   Sepsis Labs Invalid input(s): PROCALCITONIN,  WBC,  LACTICIDVEN Microbiology Recent Results (from the past 240 hour(s))  MRSA PCR Screening     Status: Abnormal   Collection Time: 02/08/18  5:27 PM  Result Value Ref Range Status   MRSA by PCR POSITIVE (A) NEGATIVE Final    Comment:        The GeneXpert MRSA Assay (FDA approved for NASAL specimens only), is one component of a comprehensive MRSA colonization surveillance program. It is not intended to diagnose MRSA infection nor to guide or monitor treatment for MRSA infections. RESULT CALLED TO, READ BACK BY AND VERIFIED WITH: R DAVIS RN 2328 02/08/18 A NAVARRO Performed at [facility], [place] [address], [place], [place] [address]    Time coordinating discharge: 32 minutes  SIGNED:  Chipper Oman, MD  [facility] 02/13/2018, 3:20 PM  Pager please text page via  [email or web address]  Note - This record has been created using [employer]. Chart creation errors have been sought, but may not always have been located. Such creation errors  do not reflect on the standard of medical care.

## 2018-02-13 NOTE — Progress Notes (Signed)
Progress Note  Patient Name: Frank Simpson Date of Encounter: 02/13/2018  Primary Cardiologist: Lauree Chandler, MD   Subjective   Patient is resting in bed.  He denies chest discomfort, dyspnea, lightheadedness.  His wife and daughter are present and feel that his breathing is much better than when he was at home and was kept awake all night due to dyspnea.  The family say that they are adamant that the patient not go to SNF due to prior bad experiences after his hip fracture.  They do not feel like the patient is ready for discharge home today.  Inpatient Medications    Scheduled Meds: . amLODipine  5 mg Oral Daily  . aspirin EC  81 mg Oral Daily  . carvedilol  6.25 mg Oral BID WC  . fluticasone  1 spray Each Nare BID  . heparin injection (subcutaneous)  5,000 Units Subcutaneous Q8H  . hydrALAZINE  25 mg Oral Q8H  . insulin aspart  0-9 Units Subcutaneous Q4H  . insulin detemir  5 Units Subcutaneous q1800  . isosorbide mononitrate  60 mg Oral Daily  . rosuvastatin  20 mg Oral Daily  . sertraline  25 mg Oral Daily  . sodium chloride flush  3 mL Intravenous Q12H  . tamsulosin  0.4 mg Oral Daily   Continuous Infusions: . sodium chloride     PRN Meds: sodium chloride, acetaminophen **OR** acetaminophen, hydrALAZINE, HYDROcodone-acetaminophen, Melatonin, sodium chloride flush   Vital Signs    Vitals:   02/12/18 1209 02/12/18 2050 02/13/18 0436 02/13/18 0437  BP: (!) 110/55 129/69 131/74   Pulse: 64 65 61   Resp:  20 16   Temp: 98.6 F (37 C) 98 F (36.7 C) (!) 97 F (36.1 C)   TempSrc: Axillary Oral Axillary   SpO2: 95% 97% 99%   Weight:    214 lb 1.1 oz (97.1 kg)  Height:        Intake/Output Summary (Last 24 hours) at 02/13/2018 1108 Last data filed at 02/13/2018 0435 Gross per 24 hour  Intake 290 ml  Output 320 ml  Net -30 ml   Filed Weights   02/11/18 0419 02/12/18 0428 02/13/18 0437  Weight: 213 lb 6.5 oz (96.8 kg) 214 lb 8.1 oz (97.3 kg) 214 lb  1.1 oz (97.1 kg)    Telemetry    AV and V pacing at 60 bpm - Personally Reviewed  ECG    No new tracings- Personally Reviewed  Physical Exam   GEN:  Chronically ill-appearing male, no acute distress.   Neck: No JVD Cardiac: RRR, no murmurs, rubs, or gallops.  Respiratory: Clear to auscultation bilaterally. GI: Soft, nontender, non-distended  MS: No edema; No deformity. Neuro:  Nonfocal, slow verbal response and poor memory Psych: Normal affect   Labs    Chemistry Recent Labs  Lab 02/08/18 2112  02/11/18 0446 02/12/18 0413 02/13/18 0427  NA 144   < > 142 146* 146*  K 3.7   < > 3.4* 3.5 3.7  CL 105   < > 102 107 105  CO2 28   < > _0 GLUCOSE 148*   < > 170* 182* 140*  BUN 24*   < > 28* 31* 34*  CREATININE 1.71*   < > 1.84* 2.12* 1.99*  CALCIUM 8.3*   < > 8.8* 8.7* 8.8*  PROT 6.7  --   --   --   --   ALBUMIN 3.2*  --   --   --   --  AST 23  --   --   --   --   ALT 14*  --   --   --   --   ALKPHOS 69  --   --   --   --   BILITOT 0.9  --   --   --   --   GFRNONAA 35*   < > 32* 27* 29*  GFRAA 40*   < > 37* 31* 34*  ANIONGAP 11   < > _0 < > = values in this interval not displayed.     Hematology Recent Labs  Lab 02/07/18 1942 02/08/18 0634 02/09/18 0347  WBC 8.8 8.1 6.4  RBC 4.81 4.24 4.43  HGB 14.2 12.3* 12.9*  HCT 44.6 38.9* 40.8  MCV 92.7 91.7 92.1  MCH 29.5 29.0 29.1  MCHC 31.8 31.6 31.6  RDW 15.6* 15.6* 15.7*  PLT 208 192 180    Cardiac Enzymes Recent Labs  Lab 02/08/18 1054 02/08/18 1517 02/08/18 2112  TROPONINI 0.33* 0.34* 0.31*    Recent Labs  Lab 02/07/18 1948  TROPIPOC 0.42*     BNP Recent Labs  Lab 02/07/18 2157  BNP 1,054.6*     DDimer  Recent Labs  Lab 02/07/18 1941  DDIMER 3.02*     Radiology    No results found.  Cardiac Studies   Echo 02/09/18 Study Conclusions - Left ventricle: The cavity size was normal. Wall thickness was increased in a pattern of moderate LVH. Systolic function  was severely reduced. The estimated ejection fraction was in the range of 20% to 25%.Diffuse hypokinesis. Features are consistent with a pseudonormal left ventricular filling pattern, with concomitant abnormal relaxation and increased filling pressure (grade 2 diastolic dysfunction). Doppler parameters are consistent with elevated ventricular end-diastolic filling pressure. - Aortic valve: There was mild regurgitation. - Mitral valve: There was mild regurgitation.  Impressions: - When compared to the prior study on 11/10/2016 LVEF has significantly decreased from 60-65% to 20-25% with diffuse hypokinesis and akinesis in the mid and apical segments. There is no obvious apical thrombus, but Definity echocontrast images should be done to confirm.  Patient Profile     84 y.o. male with PMH CAD s/p PCI to mid LAD, chronic diastolic CHF, complete heart block s/p pacemaker, HTN, HLD, DM, CKD stage 3,who is being seenfordyspnea and chest pain.  Assessment & Plan    Acute systolic heart failure: Pacemaker device interrogation had suggested atrial tachycardia and was reprogrammed in the ED.  There is question whether this could be related to the acute decreased EF.  The patient has been diuresed with weight down from 235pounds to 213 pounds, improvement in breathing and edema.  Diuresis is currently on hold due to bumped creatinine yesterday.  Weight is stable today.   CKD tage III: SCr 1.99, down from 2.12 yesterday after diuretics held.   Elevated troponin: 0.34, 3 troponins and flat pattern.  Conservative management.  Complete heart block: Permanent pacemaker in place.  Atrial tach with device reprogrammed this admission.  Currently the patient is pacing, V pacing with occasional AV pacing.  No changes in therapy.  For questions or updates, please contact [place] Please consult [email or web address] for contact info under Cardiology/STEMI.      Signed, Daune Perch, NP  02/13/2018, 11:08 AM

## 2018-02-13 NOTE — Consult Note (Signed)
[facility] CM Inpatient Consult   02/13/2018  Frank Simpson 04/23/1931 [id number]    Received call from inpatient RNCM to cross check for [facility] First eligibility.   Frank Simpson is on the All Payers List and is eligible for [place] has already reached out to [facility] with [facility] First to screen.  In the meantime, will screen patient for [facility] Care Management in case [place] does not follow.   Spoke with patient and wife Frank Simpson at bedside about potential [place] Management services if transportation or medication needs are identified if on the [place] program. Also made wife aware that [place] Management will follow post [place] completion.  Frank Simpson indicates the discharge plan is for home.  She is awaiting to see if Frank Simpson will go home with [place]. Primary Care MD is Dr. Danise Mina Velora Heckler at [facility]) [name] contact information (home) [phone] and (cell) [phone].   Frank Simpson as a high risk for unplanned readmission score of 26%. He has a history of CAD, CHF CKDstage 3 Hypertension, Hyperlpidemia, DM.  Spoke with Frank Simpson with [place]. Patient will enroll in the [place] program. Request made that [facility] First refer back to [place] Management post [facility] First completion.  Discussed all of above with inpatient RNCM.    Frank Rolling, MSN-Ed, RN,BSN [facility] Liaison [phone]

## 2018-02-13 NOTE — Progress Notes (Signed)
Spoke with pt and wife at bedside concerning 1st one program with [facility] and [facility]. Wife is aware that [place] will change from [place] to [place] and she agrees if pt qualify. [facility] referral given.

## 2018-02-13 NOTE — Progress Notes (Addendum)
Attempted to ambulate patient to obtain O2 sats, removed oxygen and after 10 minutes patient was 96-97% on RA, assisted out of bed with 3 person assist and RW, patient was extremely unstable, leaning to the side, then forward, after about 10 steps patient's legs buckled and he started to lean head first in to RW, assisted patient in to chair and then back to bed, sats dropped to 92% RA during that time. Paged MD to make aware

## 2018-02-13 NOTE — Progress Notes (Signed)
Physical Therapy Treatment Patient Details Name: Frank Simpson MRN: [id number] DOB: 25-Aug-1931 Today's Date: 02/13/2018    History of Present Illness 84 year old male who presented to chest pain and shortness of breath.  He does have a significant past medical history for recent R femur fx with IM nail January 2019, coronary artery disease, congestive heart failure, chronic kidney disease stage III, hypertension, dyslipidemia, and type 2 diabetes mellitus. Dx of acute on chronic CHF, respiratory failure.     PT Comments    +2 assist for bed mobility, transfers, and to ambulate 8' with RW. Ambulation distance limited by RLE buckling. Pt is very high fall risk. Pt's wife has declined ST-SNF. PT recommending 2 person assist for mobility, transfers only,  and use of WC at home due to fall risk, pt's wife voiced understanding. Pt's wife stated pt has ortho follow up (for January 2019 R femur fx) on 02/15/18.  RLE has buckled 3 times during this stay, once during PT eval 02/11/18, once with nursing earlier today, and again today during PT session. Encouraged pt's wife to address this issue with ortho MD at their appointment in 2 day.    Follow Up Recommendations  SNF;Supervision/Assistance - 24 hour     Equipment Recommendations  None recommended by PT    Recommendations for Other Services       Precautions / Restrictions Precautions Precautions: Fall Restrictions Weight Bearing Restrictions: No    Mobility  Bed Mobility Overal bed mobility: Needs Assistance Bed Mobility: Supine to Sit;Sit to Supine     Supine to sit: +2 for physical assistance;Mod assist     General bed mobility comments: assist to raise trunk and advance BLEs  Transfers Overall transfer level: Needs assistance Equipment used: Rolling walker (2 wheeled) Transfers: Sit to/from Stand Sit to Stand: +2 safety/equipment;Mod assist         General transfer comment: assist to rise/steady, VCs hand  placement  Ambulation/Gait Ambulation/Gait assistance: +2 safety/equipment;Min assist;Mod assist Ambulation Distance (Feet): 8 Feet Assistive device: Rolling walker (2 wheeled) Gait Pattern/deviations: Step-to pattern   Gait velocity interpretation: at or above normal speed for age/gender General Gait Details: distance limited by RLE buckling, +2 for safety and to follow closely with recliner; min/guard when ambulating, mod A to prevent fall when RLE buckled   Stairs            Wheelchair Mobility    Modified Rankin (Stroke Patients Only)       Balance Overall balance assessment: Needs assistance   Sitting balance-Leahy Scale: Fair       Standing balance-Leahy Scale: Poor                              Cognition Arousal/Alertness: Awake/alert Behavior During Therapy: WFL for tasks assessed/performed Overall Cognitive Status: Within Functional Limits for tasks assessed                                 General Comments: can follow 1 step commands with increased time and cues      Exercises      General Comments        Pertinent Vitals/Pain Pain Assessment: No/denies pain    Home Living                      Prior Function  PT Goals (current goals can now be found in the care plan section) Acute Rehab PT Goals PT Goal Formulation: With family Time For Goal Achievement: 02/25/18 Potential to Achieve Goals: Fair Progress towards PT goals: Progressing toward goals    Frequency    Min 3X/week      PT Plan Current plan remains appropriate    Co-evaluation              AM-PAC PT "6 Clicks" Daily Activity  Outcome Measure  Difficulty turning over in bed (including adjusting bedclothes, sheets and blankets)?: Unable Difficulty moving from lying on back to sitting on the side of the bed? : Unable Difficulty sitting down on and standing up from a chair with arms (e.g., wheelchair, bedside commode,  etc,.)?: Unable Help needed moving to and from a bed to chair (including a wheelchair)?: A Lot Help needed walking in hospital room?: A Lot Help needed climbing 3-5 steps with a railing? : Total 6 Click Score: 8    End of Session Equipment Utilized During Treatment: Gait belt Activity Tolerance: Patient tolerated treatment well Patient left: with call bell/phone within reach;in chair;with family/visitor present Nurse Communication: Mobility status PT Visit Diagnosis: Unsteadiness on feet (R26.81);Difficulty in walking, not elsewhere classified (R26.2);Muscle weakness (generalized) (M62.81)     Time: 9518-8416 PT Time Calculation (min) (ACUTE ONLY): 18 min  Charges:  $Therapeutic Activity: 8-22 mins                    G Codes:          Philomena Doheny 02/13/2018, 3:13 PM [phone]

## 2018-02-13 NOTE — Progress Notes (Signed)
Pt enrolling in 1st program with [facility] and [facility]. Wife states they will not need [facility]AR for transportation.

## 2018-02-13 NOTE — Progress Notes (Signed)
Inpatient Diabetes Program Recommendations  AACE/ADA: New Consensus Statement on Inpatient Glycemic Control (2015)  Target Ranges:  Prepandial:   less than 140 mg/dL      Peak postprandial:   less than 180 mg/dL (1-2 hours)      Critically ill patients:  140 - 180 mg/dL   Results for ALCIDE, MEMOLI (MRN [id number]) as of 02/13/2018 15:06  Ref. Range 02/12/2018 00:07 02/12/2018 04:24 02/12/2018 07:24 02/12/2018 12:01 02/12/2018 16:51 02/12/2018 20:48  Glucose-Capillary Latest Ref Range: 65 - 99 mg/dL 201 (H) 177 (H) 169 (H) 344 (H) 312 (H) 344 (H)    Results for CHI, GARLOW (MRN [id number]) as of 02/13/2018 15:06  Ref. Range 02/12/2018 23:36 02/13/2018 04:30 02/13/2018 07:39 02/13/2018 11:54  Glucose-Capillary Latest Ref Range: 65 - 99 mg/dL 227 (H) 138 (H) 147 (H) 295 (H)     Home DM Meds: Basaglar 35 units BID                              Humalog 3 units TID  Current Orders: Novolog Sensitive Correction Scale/ SSI (0-9 units) Q4 hours   Levemir 5 units QPM       MD- Please consider the following in-hospital insulin adjustments:  1. Change Novolog SSI to TID AC + HS  2. Start Novolog Meal Coverage: Novolog 4 units TID with meals (hold if pt eats <50% of meal)      --Will follow patient during hospitalization--  Wyn Quaker RN, MSN, CDE Diabetes Coordinator Inpatient Glycemic Control Team Team Pager: [phone] (8a-5p)

## 2018-02-13 NOTE — Progress Notes (Signed)
Patient and family given discharge, follow up, and medication instructions, verbalized understanding, IV and telemetry box removed, personal belongings with patient, family to transport home.

## 2018-02-14 DIAGNOSIS — I130 Hypertensive heart and chronic kidney disease with heart failure and stage 1 through stage 4 chronic kidney disease, or unspecified chronic kidney disease: Secondary | ICD-10-CM | POA: Diagnosis not present

## 2018-02-14 DIAGNOSIS — I5043 Acute on chronic combined systolic (congestive) and diastolic (congestive) heart failure: Secondary | ICD-10-CM | POA: Diagnosis not present

## 2018-02-15 ENCOUNTER — Ambulatory Visit (INDEPENDENT_AMBULATORY_CARE_PROVIDER_SITE_OTHER): Payer: Medicare Other

## 2018-02-15 ENCOUNTER — Ambulatory Visit (INDEPENDENT_AMBULATORY_CARE_PROVIDER_SITE_OTHER): Payer: Medicare Other | Admitting: Orthopedic Surgery

## 2018-02-15 ENCOUNTER — Encounter (INDEPENDENT_AMBULATORY_CARE_PROVIDER_SITE_OTHER): Payer: Self-pay | Admitting: Orthopedic Surgery

## 2018-02-15 ENCOUNTER — Telehealth: Payer: Self-pay | Admitting: *Deleted

## 2018-02-15 VITALS — Ht 71.0 in | Wt 214.0 lb

## 2018-02-15 DIAGNOSIS — I130 Hypertensive heart and chronic kidney disease with heart failure and stage 1 through stage 4 chronic kidney disease, or unspecified chronic kidney disease: Secondary | ICD-10-CM | POA: Diagnosis not present

## 2018-02-15 DIAGNOSIS — M25551 Pain in right hip: Secondary | ICD-10-CM | POA: Diagnosis not present

## 2018-02-15 DIAGNOSIS — I5043 Acute on chronic combined systolic (congestive) and diastolic (congestive) heart failure: Secondary | ICD-10-CM | POA: Diagnosis not present

## 2018-02-15 DIAGNOSIS — S72141A Displaced intertrochanteric fracture of right femur, initial encounter for closed fracture: Secondary | ICD-10-CM

## 2018-02-15 MED ORDER — HYDROCODONE-ACETAMINOPHEN 5-325 MG PO TABS: 1.0000 | ORAL_TABLET | Freq: Four times a day (QID) | ORAL | 0 refills | Status: DC | PRN
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
## 2018-02-15 NOTE — Telephone Encounter (Signed)
Transition Care Management Follow-up Telephone Call **TCM call completed by pts wife"   Date discharged? 02/13/2018    How have you been since you were released from the hospital? "he didn't sleep last night after midnight"   Do you understand why you were in the hospital? yes   Do you understand the discharge instructions? yes   Where were you discharged to? home   Items Reviewed:  Medications reviewed: yes  Functional Questionnaire:   Activities of Daily Living (ADLs):   States they require assistance with the following: wife states she is expecting a call regarding home health   Any transportation issues/concerns?: no   Any patient concerns? no   Confirmed importance and date/time of follow-up visits scheduled yes  Provider Appointment booked with Dr Danise Mina 3/26  Confirmed with patient if condition begins to worsen call PCP or go to the ER.  Patient was given the office number and encouraged to call back with question or concerns.  : yes

## 2018-02-16 DIAGNOSIS — I130 Hypertensive heart and chronic kidney disease with heart failure and stage 1 through stage 4 chronic kidney disease, or unspecified chronic kidney disease: Secondary | ICD-10-CM | POA: Diagnosis not present

## 2018-02-16 DIAGNOSIS — I5043 Acute on chronic combined systolic (congestive) and diastolic (congestive) heart failure: Secondary | ICD-10-CM | POA: Diagnosis not present

## 2018-02-19 ENCOUNTER — Telehealth: Payer: Self-pay | Admitting: Family Medicine

## 2018-02-19 DIAGNOSIS — I130 Hypertensive heart and chronic kidney disease with heart failure and stage 1 through stage 4 chronic kidney disease, or unspecified chronic kidney disease: Secondary | ICD-10-CM | POA: Diagnosis not present

## 2018-02-19 DIAGNOSIS — I5043 Acute on chronic combined systolic (congestive) and diastolic (congestive) heart failure: Secondary | ICD-10-CM | POA: Diagnosis not present

## 2018-02-19 NOTE — Telephone Encounter (Signed)
LM on Don's work Advertising account executive with order approval to move forward with visits as requested.

## 2018-02-19 NOTE — Telephone Encounter (Signed)
Copied from [place]. Topic: Quick Communication - See Telephone Encounter >> Feb 19, 2018  4:28 PM Cleaster Corin, NT wrote: CRM for notification. See Telephone encounter for: 02/19/18.  Timmothy Sours from [facility] calling for verbal orders for OT for adl transfer and exercis. Timmothy Sours can be reached at [phone] can leave vm 1 week 2 2 week 1 1 week 1

## 2018-02-19 NOTE — Telephone Encounter (Signed)
Agree with this. Thanks.

## 2018-02-20 ENCOUNTER — Ambulatory Visit (INDEPENDENT_AMBULATORY_CARE_PROVIDER_SITE_OTHER): Payer: Medicare Other | Admitting: Family Medicine

## 2018-02-20 ENCOUNTER — Encounter: Payer: Self-pay | Admitting: Family Medicine

## 2018-02-20 VITALS — BP 132/58 | HR 61 | Temp 97.4°F | Wt 225.0 lb

## 2018-02-20 DIAGNOSIS — I5041 Acute combined systolic (congestive) and diastolic (congestive) heart failure: Secondary | ICD-10-CM

## 2018-02-20 DIAGNOSIS — N183 Chronic kidney disease, stage 3 unspecified: Secondary | ICD-10-CM

## 2018-02-20 DIAGNOSIS — Z794 Long term (current) use of insulin: Secondary | ICD-10-CM | POA: Diagnosis not present

## 2018-02-20 DIAGNOSIS — N179 Acute kidney failure, unspecified: Secondary | ICD-10-CM

## 2018-02-20 DIAGNOSIS — F0390 Unspecified dementia without behavioral disturbance: Secondary | ICD-10-CM | POA: Diagnosis not present

## 2018-02-20 DIAGNOSIS — E1143 Type 2 diabetes mellitus with diabetic autonomic (poly)neuropathy: Secondary | ICD-10-CM | POA: Diagnosis not present

## 2018-02-20 DIAGNOSIS — I2510 Atherosclerotic heart disease of native coronary artery without angina pectoris: Secondary | ICD-10-CM

## 2018-02-20 DIAGNOSIS — IMO0002 Reserved for concepts with insufficient information to code with codable children: Secondary | ICD-10-CM

## 2018-02-20 DIAGNOSIS — E1165 Type 2 diabetes mellitus with hyperglycemia: Secondary | ICD-10-CM | POA: Diagnosis not present

## 2018-02-20 DIAGNOSIS — S72141A Displaced intertrochanteric fracture of right femur, initial encounter for closed fracture: Secondary | ICD-10-CM | POA: Diagnosis not present

## 2018-02-20 DIAGNOSIS — I5022 Chronic systolic (congestive) heart failure: Secondary | ICD-10-CM

## 2018-02-20 DIAGNOSIS — E1122 Type 2 diabetes mellitus with diabetic chronic kidney disease: Secondary | ICD-10-CM | POA: Diagnosis not present

## 2018-02-20 LAB — CBC WITH DIFFERENTIAL/PLATELET
BASOS ABS: 0 10*3/uL (ref 0.0–0.1)
Basophils Relative: 0.2 % (ref 0.0–3.0)
Eosinophils Absolute: 0.3 10*3/uL (ref 0.0–0.7)
Eosinophils Relative: 3.5 % (ref 0.0–5.0)
HCT: 41.6 % (ref 39.0–52.0)
Hemoglobin: 13.8 g/dL (ref 13.0–17.0)
LYMPHS ABS: 2.8 10*3/uL (ref 0.7–4.0)
Lymphocytes Relative: 38.5 % (ref 12.0–46.0)
MCHC: 33.1 g/dL (ref 30.0–36.0)
MCV: 88.8 fl (ref 78.0–100.0)
MONOS PCT: 9.2 % (ref 3.0–12.0)
Monocytes Absolute: 0.7 10*3/uL (ref 0.1–1.0)
NEUTROS PCT: 48.6 % (ref 43.0–77.0)
Neutro Abs: 3.5 10*3/uL (ref 1.4–7.7)
Platelets: 161 10*3/uL (ref 150.0–400.0)
RBC: 4.69 Mil/uL (ref 4.22–5.81)
RDW: 15.7 % — ABNORMAL HIGH (ref 11.5–15.5)
WBC: 7.1 10*3/uL (ref 4.0–10.5)

## 2018-02-20 LAB — RENAL FUNCTION PANEL
Albumin: 3.3 g/dL — ABNORMAL LOW (ref 3.5–5.2)
BUN: 27 mg/dL — ABNORMAL HIGH (ref 6–23)
CALCIUM: 8.8 mg/dL (ref 8.4–10.5)
CO2: 27 mEq/L (ref 19–32)
CREATININE: 1.62 mg/dL — AB (ref 0.40–1.50)
Chloride: 103 mEq/L (ref 96–112)
GFR: 43.24 mL/min — AB (ref >60.00)
GLUCOSE: 168 mg/dL — AB (ref 70–99)
Phosphorus: 3.2 mg/dL (ref 2.3–4.6)
Potassium: 4 mEq/L (ref 3.5–5.1)
SODIUM: 137 meq/L (ref 135–145)

## 2018-02-20 LAB — BRAIN NATRIURETIC PEPTIDE: PRO B NATRI PEPTIDE: 690 pg/mL — AB (ref 0.0–100.0)

## 2018-02-20 MED ORDER — ROSUVASTATIN CALCIUM 20 MG PO TABS: 20.0000 mg | ORAL_TABLET | Freq: Every day | ORAL | 3 refills | Status: AC

## 2018-02-20 MED ORDER — CARVEDILOL 6.25 MG PO TABS: 6.2500 mg | ORAL_TABLET | Freq: Two times a day (BID) | ORAL | 3 refills | Status: AC

## 2018-02-20 MED ORDER — FUROSEMIDE 40 MG PO TABS: 40.0000 mg | ORAL_TABLET | Freq: Every day | ORAL | 3 refills | Status: DC

## 2018-02-20 MED ORDER — HYDRALAZINE HCL 25 MG PO TABS: 25.0000 mg | ORAL_TABLET | Freq: Three times a day (TID) | ORAL | 3 refills | Status: AC

## 2018-02-20 MED ORDER — ISOSORBIDE MONONITRATE ER 60 MG PO TB24: 60.0000 mg | ORAL_TABLET | Freq: Every day | ORAL | 3 refills | Status: DC

## 2018-02-20 NOTE — Progress Notes (Signed)
BP (!) 132/58   Pulse 61   Temp (!) 97.4 F (36.3 C) (Oral)   Wt 225 lb (102.1 kg)   SpO2 94%   BMI 31.38 kg/m    CC: hosp f/u visit Subjective:    Patient ID: Frank Simpson, male    DOB: 1931-05-14, 84 y.o.   MRN: [id number]  HPI: Frank Simpson is a 84 y.o. male presenting on 02/20/2018 for Hospitalization Follow-up   Here with wife and DIL.   Recent hospitalization for acute on chronic combined CHF exacerbation with acute hypoxic respiratory failure. Records reviewed. Was found to have acute decreased LV systolic function with EF 20-25% and diffuse hypokinesis. Cardiology saw patient. Was treated with IV diuresis with improvement. SNF was recommended upon discharge, pt and wife declined this. Persistent dementia, wife is his caregiver. Oxygen was able to be weaned off during hospitalization. New meds started were carvedilol, lasix, and hydralazine. Isosorbide dose was increased. Cardiology f/u scheduled 03/07/2018.  Doesn't do home weights.  [place] involved - PT, OT, nurse aide.  Back on lasix 63m daily.  Last 2 days increased wheezing, dyspnea  Since last seen here, he has had 2 hospitalizations and 2 ER visits for falls. Initial hospitalization occurred 11/2017 where he suffered comminuted R intertrochanteric femoral fracture s/p surgery with intramedullary nail placement. Saw Dr DSharol Givenyesterday for f/u of R femur fracture s/p intramedullary nail fixation - Rx hydrocodone to help sleep.   Requests new accuchek glucometer to local pharmacy (one-touch no longer covered)  Admit date: 02/07/2018 Discharge date: 02/13/2018 TCM hosp f/u phone call performed 02/15/2018  Admitted From: Home  Disposition: Home   Recommendations for Outpatient Follow-up:  1. Follow up with PCP in 1 week.  2. Follow up with cardiology in 2 weeks 3. Repeat echocardiogram in 3 months 4. Please obtain BMP/CBC in one week to monitor hemoglobin and renal function  Home Health:  PT/OT/RN/Aid/SW Equipment/Devices: Walker  Discharge Condition: Stable  CODE STATUS: Full Code  Diet recommendation: Heart Healthy / Carb Modified  Relevant past medical, surgical, family and social history reviewed and updated as indicated. Interim medical history since our last visit reviewed. Allergies and medications reviewed and updated. Discharge medications reconciled with current medications.  Outpatient Medications Prior to Visit  Medication Sig Dispense Refill  . acetaminophen (TYLENOL) 500 MG tablet Take 1 tablet (500 mg total) by mouth every 6 (six) hours as needed for mild pain. 30 tablet 0  . amLODipine (NORVASC) 5 MG tablet Take 1 tablet (5 mg total) by mouth daily. 30 tablet 0  . aspirin EC 325 MG tablet Take 1 tablet (325 mg total) by mouth daily. 30 tablet 0  . fluticasone (FLONASE) 50 MCG/ACT nasal spray Place 1 spray into both nostrils 2 (two) times daily.  2  . HYDROcodone-acetaminophen (NORCO/VICODIN) 5-325 MG tablet Take 1 tablet by mouth every 6 (six) hours as needed for moderate pain. #42 doses 30 tablet 0  . HYDROcodone-acetaminophen (NORCO/VICODIN) 5-325 MG tablet Take 1 tablet by mouth every 6 (six) hours as needed for moderate pain. 28 tablet 0  . Insulin Glargine (BASAGLAR KWIKPEN) 100 UNIT/ML SOPN Inject 0.35 mLs (35 Units total) into the skin 2 (two) times daily. 30 mL 0  . insulin lispro (HUMALOG KWIKPEN) 100 UNIT/ML KiwkPen INJECT 3 UNITS  TID with meals 15 mL 0  . Insulin Pen Needle 32G X 4 MM MISC 1 each by Does not apply route 4 (four) times daily. 200 each 0  .  Melatonin 5 MG TABS Take 1 tablet (5 mg total) by mouth at bedtime as needed (sleep).  0  . sertraline (ZOLOFT) 25 MG tablet Take 1 tablet (25 mg total) by mouth daily. 30 tablet 0  . tamsulosin (FLOMAX) 0.4 MG CAPS capsule Take 1 capsule (0.4 mg total) by mouth daily. 30 capsule 0  . carvedilol (COREG) 6.25 MG tablet Take 1 tablet (6.25 mg total) by mouth 2 (two) times daily with a meal. 60 tablet  0  . furosemide (LASIX) 40 MG tablet Take 1 tablet (40 mg total) by mouth daily. 30 tablet 0  . glucose blood test strip Use as instructed 100 each 0  . hydrALAZINE (APRESOLINE) 25 MG tablet Take 1 tablet (25 mg total) by mouth every 8 (eight) hours. 90 tablet 0  . isosorbide mononitrate (IMDUR) 60 MG 24 hr tablet Take 1 tablet (60 mg total) by mouth daily. 30 tablet 0  . Lancets (ONETOUCH ULTRASOFT) lancets Use as instructed 100 each 0  . rosuvastatin (CRESTOR) 20 MG tablet Take 1 tablet (20 mg total) by mouth daily. 30 tablet 0   No facility-administered medications prior to visit.      Per HPI unless specifically indicated in ROS section below Review of Systems     Objective:    BP (!) 132/58   Pulse 61   Temp (!) 97.4 F (36.3 C) (Oral)   Wt 225 lb (102.1 kg)   SpO2 94%   BMI 31.38 kg/m   Wt Readings from Last 3 Encounters:  02/20/18 225 lb (102.1 kg)  02/15/18 214 lb (97.1 kg)  02/13/18 214 lb 1.1 oz (97.1 kg)    Physical Exam  Constitutional: He appears well-developed and well-nourished. No distress.  HENT:  Mouth/Throat: Oropharynx is clear and moist. No oropharyngeal exudate.  Cardiovascular: Normal rate, regular rhythm, normal heart sounds and intact distal pulses.  No murmur heard. Pulmonary/Chest: Effort normal and breath sounds normal. No respiratory distress. He has no wheezes. He has no rales.  Coarse bibasilar crackles  Musculoskeletal: He exhibits edema (1+ pitting).  Skin: Skin is warm and dry. No rash noted.  Nursing note and vitals reviewed.  Results for orders placed or performed in visit on 02/20/18  Renal function panel  Result Value Ref Range   Sodium 137 135 - 145 mEq/L   Potassium 4.0 3.5 - 5.1 mEq/L   Chloride 103 96 - 112 mEq/L   CO2 27 19 - 32 mEq/L   Calcium 8.8 8.4 - 10.5 mg/dL   Albumin 3.3 (L) 3.5 - 5.2 g/dL   BUN 27 (H) 6 - 23 mg/dL   Creatinine, Ser 1.62 (H) 0.40 - 1.50 mg/dL   Glucose, Bld 168 (H) 70 - 99 mg/dL   Phosphorus 3.2  2.3 - 4.6 mg/dL   GFR 43.24 (L) >60.00 mL/min  CBC with Differential/Platelet  Result Value Ref Range   WBC 7.1 4.0 - 10.5 K/uL   RBC 4.69 4.22 - 5.81 Mil/uL   Hemoglobin 13.8 13.0 - 17.0 g/dL   HCT 41.6 39.0 - 52.0 %   MCV 88.8 78.0 - 100.0 fl   MCHC 33.1 30.0 - 36.0 g/dL   RDW 15.7 (H) 11.5 - 15.5 %   Platelets 161.0 150.0 - 400.0 K/uL   Neutrophils Relative % 48.6 43.0 - 77.0 %   Lymphocytes Relative 38.5 12.0 - 46.0 %   Monocytes Relative 9.2 3.0 - 12.0 %   Eosinophils Relative 3.5 0.0 - 5.0 %   Basophils Relative  0.2 0.0 - 3.0 %   Neutro Abs 3.5 1.4 - 7.7 K/uL   Lymphs Abs 2.8 0.7 - 4.0 K/uL   Monocytes Absolute 0.7 0.1 - 1.0 K/uL   Eosinophils Absolute 0.3 0.0 - 0.7 K/uL   Basophils Absolute 0.0 0.0 - 0.1 K/uL  Brain natriuretic peptide  Result Value Ref Range   Pro B Natriuretic peptide (BNP) 690.0 (H) 0.0 - 100.0 pg/mL    Lab Results  Component Value Date   CREATININE 1.62 (H) 02/20/2018   BUN 27 (H) 02/20/2018   NA 137 02/20/2018   K 4.0 02/20/2018   CL 103 02/20/2018   CO2 27 02/20/2018    Lab Results  Component Value Date   WBC 7.1 02/20/2018   HGB 13.8 02/20/2018   HCT 41.6 02/20/2018   MCV 88.8 02/20/2018   PLT 161.0 02/20/2018       Assessment & Plan:   Problem List Items Addressed This Visit    Acute combined systolic and diastolic heart failure ([place]) - Primary    Recent hospitalization for this s/p diuresis. Now 9 lb weight gain noted over the past week. Concern for recurrent acute exacerbation - will check labs today and recommended he take lasix 50m bid for next 3 days, update [place] effect.       Relevant Medications   hydrALAZINE (APRESOLINE) 25 MG tablet   furosemide (LASIX) 40 MG tablet   isosorbide mononitrate (IMDUR) 60 MG 24 hr tablet   carvedilol (COREG) 6.25 MG tablet   rosuvastatin (CRESTOR) 20 MG tablet   Other Relevant Orders   CBC with Differential/Platelet (Completed)   Brain natriuretic peptide (Completed)   RESOLVED: ARF  (acute renal failure) (HCC)   Relevant Orders   Renal function panel (Completed)   CAD (coronary artery disease)   Relevant Medications   hydrALAZINE (APRESOLINE) 25 MG tablet   furosemide (LASIX) 40 MG tablet   isosorbide mononitrate (IMDUR) 60 MG 24 hr tablet   carvedilol (COREG) 6.25 MG tablet   rosuvastatin (CRESTOR) 20 MG tablet   Chronic systolic heart failure (HCC)    Recent exacerbation in hospital, resolved with diuresis - encouraged daily weights to monitor CHF. Reviewed new CHF regimen to include carvedilol, lasix, hydralazine.       Relevant Medications   hydrALAZINE (APRESOLINE) 25 MG tablet   furosemide (LASIX) 40 MG tablet   isosorbide mononitrate (IMDUR) 60 MG 24 hr tablet   carvedilol (COREG) 6.25 MG tablet   rosuvastatin (CRESTOR) 20 MG tablet   CKD stage 3 due to type 2 diabetes mellitus ([place]    Update Cr. New baseline Cr seems to be 1.9.       Relevant Medications   rosuvastatin (CRESTOR) 20 MG tablet   Dementia    Chronic, persistent. Reviewed with family.       Displaced intertrochanteric fracture of right femur, initial encounter for closed fracture ([facility]    Initial hospitalization 11/2017 s/p rehab stay;. Family declined return to SNF during latest hospitalization. Has f/u with ortho. Seems to be recovering adequately. Discussed hydrocodone dosing.       Uncontrolled diabetes mellitus with diabetic autonomic neuropathy, with long-term current use of insulin ([place]    accuchek glucometer sent in per wife's request.       Relevant Medications   rosuvastatin (CRESTOR) 20 MG tablet       Meds ordered this encounter  Medications  . hydrALAZINE (APRESOLINE) 25 MG tablet    Sig: Take 1 tablet (25 mg  total) by mouth every 8 (eight) hours.    Dispense:  270 tablet    Refill:  3  . furosemide (LASIX) 40 MG tablet    Sig: Take 1 tablet (40 mg total) by mouth daily. Take second dose as instructed    Dispense:  150 tablet    Refill:  3  . isosorbide  mononitrate (IMDUR) 60 MG 24 hr tablet    Sig: Take 1 tablet (60 mg total) by mouth daily.    Dispense:  90 tablet    Refill:  3  . carvedilol (COREG) 6.25 MG tablet    Sig: Take 1 tablet (6.25 mg total) by mouth 2 (two) times daily with a meal.    Dispense:  180 tablet    Refill:  3  . rosuvastatin (CRESTOR) 20 MG tablet    Sig: Take 1 tablet (20 mg total) by mouth daily.    Dispense:  90 tablet    Refill:  3  . Blood Glucose Monitoring Suppl (ACCU-CHEK AVIVA) device    Sig: Use as instructed    Dispense:  1 each    Refill:  0  . glucose blood (ACCU-CHEK AVIVA PLUS) test strip    Sig: Use to check sugars four times daily E11.43    Dispense:  100 each    Refill:  12  . Lancets (ACCU-CHEK SOFT TOUCH) lancets    Sig: Use to check sugars four times daily E11.43    Dispense:  100 each    Refill:  12   Orders Placed This Encounter  Procedures  . Renal function panel  . CBC with Differential/Platelet  . Brain natriuretic peptide    Follow up plan: Return if symptoms worsen or fail to improve.  [name], MD

## 2018-02-20 NOTE — Patient Instructions (Addendum)
Labs today.  Increase lasix to 83m twice daily for 3 days and update u[place] effect (morning and early afternoon).  Work with physical therapist on how to get daily weights.  Return in 1 month for follow up visit, bring sugar log to review.

## 2018-02-21 DIAGNOSIS — I5043 Acute on chronic combined systolic (congestive) and diastolic (congestive) heart failure: Secondary | ICD-10-CM | POA: Diagnosis not present

## 2018-02-21 DIAGNOSIS — I130 Hypertensive heart and chronic kidney disease with heart failure and stage 1 through stage 4 chronic kidney disease, or unspecified chronic kidney disease: Secondary | ICD-10-CM | POA: Diagnosis not present

## 2018-02-21 MED ORDER — GLUCOSE BLOOD VI STRP: ORAL_STRIP | 12 refills | Status: AC

## 2018-02-21 MED ORDER — ACCU-CHEK SOFT TOUCH LANCETS MISC: 12 refills | Status: AC

## 2018-02-21 MED ORDER — ACCU-CHEK AVIVA DEVI: 0 refills | Status: AC

## 2018-02-21 NOTE — Assessment & Plan Note (Signed)
Recent hospitalization for this s/p diuresis. Now 9 lb weight gain noted over the past week. Concern for recurrent acute exacerbation - will check labs today and recommended he take lasix 43m bid for next 3 days, update u[place] effect.

## 2018-02-21 NOTE — Assessment & Plan Note (Signed)
accuchek glucometer sent in per wife's request.

## 2018-02-21 NOTE — Assessment & Plan Note (Signed)
Chronic, persistent. Reviewed with family.

## 2018-02-21 NOTE — Assessment & Plan Note (Signed)
Update Cr. New baseline Cr seems to be 1.9.

## 2018-02-21 NOTE — Assessment & Plan Note (Addendum)
Initial hospitalization 11/2017 s/p rehab stay;. Family declined return to SNF during latest hospitalization. Has f/u with ortho. Seems to be recovering adequately. Discussed hydrocodone dosing.

## 2018-02-21 NOTE — Assessment & Plan Note (Addendum)
Recent exacerbation in hospital, resolved with diuresis - encouraged daily weights to monitor CHF. Reviewed new CHF regimen to include carvedilol, lasix, hydralazine.

## 2018-02-22 DIAGNOSIS — I130 Hypertensive heart and chronic kidney disease with heart failure and stage 1 through stage 4 chronic kidney disease, or unspecified chronic kidney disease: Secondary | ICD-10-CM | POA: Diagnosis not present

## 2018-02-22 DIAGNOSIS — I5043 Acute on chronic combined systolic (congestive) and diastolic (congestive) heart failure: Secondary | ICD-10-CM | POA: Diagnosis not present

## 2018-02-23 DIAGNOSIS — I130 Hypertensive heart and chronic kidney disease with heart failure and stage 1 through stage 4 chronic kidney disease, or unspecified chronic kidney disease: Secondary | ICD-10-CM | POA: Diagnosis not present

## 2018-02-23 DIAGNOSIS — I5043 Acute on chronic combined systolic (congestive) and diastolic (congestive) heart failure: Secondary | ICD-10-CM | POA: Diagnosis not present

## 2018-02-26 DIAGNOSIS — I130 Hypertensive heart and chronic kidney disease with heart failure and stage 1 through stage 4 chronic kidney disease, or unspecified chronic kidney disease: Secondary | ICD-10-CM | POA: Diagnosis not present

## 2018-02-26 DIAGNOSIS — I5043 Acute on chronic combined systolic (congestive) and diastolic (congestive) heart failure: Secondary | ICD-10-CM | POA: Diagnosis not present

## 2018-02-27 DIAGNOSIS — I130 Hypertensive heart and chronic kidney disease with heart failure and stage 1 through stage 4 chronic kidney disease, or unspecified chronic kidney disease: Secondary | ICD-10-CM | POA: Diagnosis not present

## 2018-02-27 DIAGNOSIS — I5043 Acute on chronic combined systolic (congestive) and diastolic (congestive) heart failure: Secondary | ICD-10-CM | POA: Diagnosis not present

## 2018-02-28 DIAGNOSIS — I5043 Acute on chronic combined systolic (congestive) and diastolic (congestive) heart failure: Secondary | ICD-10-CM | POA: Diagnosis not present

## 2018-02-28 DIAGNOSIS — I130 Hypertensive heart and chronic kidney disease with heart failure and stage 1 through stage 4 chronic kidney disease, or unspecified chronic kidney disease: Secondary | ICD-10-CM | POA: Diagnosis not present

## 2018-03-01 DIAGNOSIS — J9601 Acute respiratory failure with hypoxia: Secondary | ICD-10-CM | POA: Diagnosis not present

## 2018-03-01 DIAGNOSIS — E7800 Pure hypercholesterolemia, unspecified: Secondary | ICD-10-CM

## 2018-03-01 DIAGNOSIS — I252 Old myocardial infarction: Secondary | ICD-10-CM

## 2018-03-01 DIAGNOSIS — R1313 Dysphagia, pharyngeal phase: Secondary | ICD-10-CM

## 2018-03-01 DIAGNOSIS — N183 Chronic kidney disease, stage 3 (moderate): Secondary | ICD-10-CM | POA: Diagnosis not present

## 2018-03-01 DIAGNOSIS — Z794 Long term (current) use of insulin: Secondary | ICD-10-CM

## 2018-03-01 DIAGNOSIS — Z950 Presence of cardiac pacemaker: Secondary | ICD-10-CM

## 2018-03-01 DIAGNOSIS — M1991 Primary osteoarthritis, unspecified site: Secondary | ICD-10-CM

## 2018-03-01 DIAGNOSIS — E1122 Type 2 diabetes mellitus with diabetic chronic kidney disease: Secondary | ICD-10-CM | POA: Diagnosis not present

## 2018-03-01 DIAGNOSIS — I5043 Acute on chronic combined systolic (congestive) and diastolic (congestive) heart failure: Secondary | ICD-10-CM | POA: Diagnosis not present

## 2018-03-01 DIAGNOSIS — E538 Deficiency of other specified B group vitamins: Secondary | ICD-10-CM

## 2018-03-01 DIAGNOSIS — E1143 Type 2 diabetes mellitus with diabetic autonomic (poly)neuropathy: Secondary | ICD-10-CM | POA: Diagnosis not present

## 2018-03-01 DIAGNOSIS — K3184 Gastroparesis: Secondary | ICD-10-CM | POA: Diagnosis not present

## 2018-03-01 DIAGNOSIS — F332 Major depressive disorder, recurrent severe without psychotic features: Secondary | ICD-10-CM | POA: Diagnosis not present

## 2018-03-01 DIAGNOSIS — I2510 Atherosclerotic heart disease of native coronary artery without angina pectoris: Secondary | ICD-10-CM | POA: Diagnosis not present

## 2018-03-01 DIAGNOSIS — E039 Hypothyroidism, unspecified: Secondary | ICD-10-CM | POA: Diagnosis not present

## 2018-03-01 DIAGNOSIS — Z7982 Long term (current) use of aspirin: Secondary | ICD-10-CM

## 2018-03-01 DIAGNOSIS — I130 Hypertensive heart and chronic kidney disease with heart failure and stage 1 through stage 4 chronic kidney disease, or unspecified chronic kidney disease: Secondary | ICD-10-CM | POA: Diagnosis not present

## 2018-03-01 DIAGNOSIS — Z9181 History of falling: Secondary | ICD-10-CM

## 2018-03-01 DIAGNOSIS — F0280 Dementia in other diseases classified elsewhere without behavioral disturbance: Secondary | ICD-10-CM | POA: Diagnosis not present

## 2018-03-01 DIAGNOSIS — Z993 Dependence on wheelchair: Secondary | ICD-10-CM

## 2018-03-01 DIAGNOSIS — I442 Atrioventricular block, complete: Secondary | ICD-10-CM | POA: Diagnosis not present

## 2018-03-02 ENCOUNTER — Telehealth: Payer: Self-pay | Admitting: Family Medicine

## 2018-03-02 DIAGNOSIS — I5043 Acute on chronic combined systolic (congestive) and diastolic (congestive) heart failure: Secondary | ICD-10-CM | POA: Diagnosis not present

## 2018-03-02 DIAGNOSIS — I130 Hypertensive heart and chronic kidney disease with heart failure and stage 1 through stage 4 chronic kidney disease, or unspecified chronic kidney disease: Secondary | ICD-10-CM | POA: Diagnosis not present

## 2018-03-02 MED ORDER — POLYMYXIN B-TRIMETHOPRIM 10000-0.1 UNIT/ML-% OP SOLN: 1.0000 [drp] | Freq: Four times a day (QID) | OPHTHALMIC | 0 refills | Status: DC

## 2018-03-02 NOTE — Telephone Encounter (Signed)
Would start polytrim and update [place] as needed, see eval over the weekend if worse.  rx sent.  Thanks.

## 2018-03-02 NOTE — Telephone Encounter (Signed)
Copied from [place]. Topic: General - Other >> Mar 02, 2018  2:48 PM Darl Householder, RMA wrote: Reason for CRM: Tommi Emery from [facility] is calling to report patient's left lower eye lid is moderately red, rt eye is normal fleshly color, afraid it may be an infection wanted to notify Dr. Danise Mina , please return call to [facility] at [phone]

## 2018-03-02 NOTE — Telephone Encounter (Signed)
Wife advised.

## 2018-03-05 DIAGNOSIS — I130 Hypertensive heart and chronic kidney disease with heart failure and stage 1 through stage 4 chronic kidney disease, or unspecified chronic kidney disease: Secondary | ICD-10-CM | POA: Diagnosis not present

## 2018-03-05 DIAGNOSIS — I5043 Acute on chronic combined systolic (congestive) and diastolic (congestive) heart failure: Secondary | ICD-10-CM | POA: Diagnosis not present

## 2018-03-06 DIAGNOSIS — I471 Supraventricular tachycardia: Secondary | ICD-10-CM | POA: Insufficient documentation

## 2018-03-06 DIAGNOSIS — I130 Hypertensive heart and chronic kidney disease with heart failure and stage 1 through stage 4 chronic kidney disease, or unspecified chronic kidney disease: Secondary | ICD-10-CM | POA: Diagnosis not present

## 2018-03-06 DIAGNOSIS — I5043 Acute on chronic combined systolic (congestive) and diastolic (congestive) heart failure: Secondary | ICD-10-CM | POA: Diagnosis not present
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
## 2018-03-07 ENCOUNTER — Encounter: Payer: Self-pay | Admitting: Physician Assistant

## 2018-03-07 ENCOUNTER — Ambulatory Visit (INDEPENDENT_AMBULATORY_CARE_PROVIDER_SITE_OTHER): Payer: Medicare Other | Admitting: Physician Assistant

## 2018-03-07 ENCOUNTER — Ambulatory Visit
Admission: RE | Admit: 2018-03-07 | Discharge: 2018-03-07 | Disposition: A | Discharge status: Home / Self Care | Payer: Medicare Other | Source: Ambulatory Visit | Attending: Physician Assistant | Admitting: Physician Assistant

## 2018-03-07 VITALS — BP 86/52 | HR 63 | Ht 72.0 in | Wt 224.2 lb

## 2018-03-07 DIAGNOSIS — I4719 Other supraventricular tachycardia: Secondary | ICD-10-CM

## 2018-03-07 DIAGNOSIS — I2510 Atherosclerotic heart disease of native coronary artery without angina pectoris: Secondary | ICD-10-CM

## 2018-03-07 DIAGNOSIS — I130 Hypertensive heart and chronic kidney disease with heart failure and stage 1 through stage 4 chronic kidney disease, or unspecified chronic kidney disease: Secondary | ICD-10-CM | POA: Diagnosis not present

## 2018-03-07 DIAGNOSIS — I471 Supraventricular tachycardia: Secondary | ICD-10-CM

## 2018-03-07 DIAGNOSIS — I5043 Acute on chronic combined systolic (congestive) and diastolic (congestive) heart failure: Secondary | ICD-10-CM | POA: Diagnosis not present

## 2018-03-07 DIAGNOSIS — J90 Pleural effusion, not elsewhere classified: Secondary | ICD-10-CM | POA: Diagnosis not present

## 2018-03-07 DIAGNOSIS — I5022 Chronic systolic (congestive) heart failure: Secondary | ICD-10-CM

## 2018-03-07 LAB — CBC WITH DIFFERENTIAL/PLATELET
BASOS: 0 %
Basophils Absolute: 0 10*3/uL (ref 0.0–0.2)
EOS (ABSOLUTE): 0.3 10*3/uL (ref 0.0–0.4)
EOS: 4 %
Hematocrit: 39.7 % (ref 37.5–51.0)
Hemoglobin: 13.6 g/dL (ref 13.0–17.7)
LYMPHS ABS: 2.9 10*3/uL (ref 0.7–3.1)
Lymphs: 40 %
MCH: 29.1 pg (ref 26.6–33.0)
MCHC: 34.3 g/dL (ref 31.5–35.7)
MCV: 85 fL (ref 79–97)
MONOS ABS: 0.6 10*3/uL (ref 0.1–0.9)
Monocytes: 8 %
Neutrophils Absolute: 3.5 10*3/uL (ref 1.4–7.0)
Neutrophils: 48 %
PLATELETS: 156 10*3/uL (ref 150–379)
RBC: 4.68 x10E6/uL (ref 4.14–5.80)
RDW: 16.3 % — AB (ref 12.3–15.4)
WBC: 7.3 10*3/uL (ref 3.4–10.8)

## 2018-03-07 LAB — BASIC METABOLIC PANEL
BUN / CREAT RATIO: 17 (ref 10–24)
BUN: 30 mg/dL — ABNORMAL HIGH (ref 8–27)
CO2: 28 mmol/L (ref 20–29)
CREATININE: 1.77 mg/dL — AB (ref 0.76–1.27)
Calcium: 9 mg/dL (ref 8.6–10.2)
Chloride: 105 mmol/L (ref 96–106)
GFR calc Af Amer: 40 mL/min/{1.73_m2} — ABNORMAL LOW (ref >59)
GFR, EST NON AFRICAN AMERICAN: 34 mL/min/{1.73_m2} — AB (ref >59)
GLUCOSE: 81 mg/dL (ref 65–99)
POTASSIUM: 4 mmol/L (ref 3.5–5.2)
Sodium: 139 mmol/L (ref 134–144)

## 2018-03-07 LAB — PRO B NATRIURETIC PEPTIDE: NT-Pro BNP: 3753 pg/mL — ABNORMAL HIGH (ref 0–486)

## 2018-03-07 MED ORDER — ISOSORBIDE MONONITRATE ER 30 MG PO TB24: 30.0000 mg | ORAL_TABLET | Freq: Every day | ORAL | 3 refills | Status: AC

## 2018-03-07 NOTE — Patient Instructions (Addendum)
Medication Instructions:  Your physician has recommended you make the following change in your medication:  1-STOP amilodopine  2-DECREASE Imdur to 30 mg by mouth daily  Labwork: Your physician recommends that you have STAT lab work today- BMET, CBC, and BNP  Testing/Procedures: A chest x-ray takes a picture of the organs and structures inside the chest, including the heart, lungs, and blood vessels. This test can show several things, including, whether the heart is enlarges; whether fluid is building up in the lungs; and whether pacemaker / defibrillator leads are still in place.  Follow-Up: Your physician wants you to follow-up next week.   If you need a refill on your cardiac medications before your next appointment, please call your pharmacy.

## 2018-03-08 ENCOUNTER — Ambulatory Visit: Payer: Self-pay | Admitting: Family Medicine

## 2018-03-12 DIAGNOSIS — I5043 Acute on chronic combined systolic (congestive) and diastolic (congestive) heart failure: Secondary | ICD-10-CM | POA: Diagnosis not present

## 2018-03-12 DIAGNOSIS — I130 Hypertensive heart and chronic kidney disease with heart failure and stage 1 through stage 4 chronic kidney disease, or unspecified chronic kidney disease: Secondary | ICD-10-CM | POA: Diagnosis not present

## 2018-03-14 ENCOUNTER — Telehealth: Payer: Self-pay

## 2018-03-14 NOTE — Telephone Encounter (Signed)
Agree with this. Thanks.

## 2018-03-14 NOTE — Telephone Encounter (Signed)
Copied from [place] [id number]. Topic: General - Other >> Mar 14, 2018  1:45 PM Carolyn Stare wrote:  Timmothy Sours with [facility] call to req verbal orders to extend OT 1 visit for tub transfer   [phone]

## 2018-03-15 ENCOUNTER — Ambulatory Visit (INDEPENDENT_AMBULATORY_CARE_PROVIDER_SITE_OTHER): Payer: Medicare Other | Admitting: Cardiovascular Disease

## 2018-03-15 ENCOUNTER — Encounter: Payer: Self-pay | Admitting: Cardiovascular Disease

## 2018-03-15 VITALS — BP 152/80 | HR 66 | Ht 72.0 in | Wt 221.0 lb

## 2018-03-15 DIAGNOSIS — I5023 Acute on chronic systolic (congestive) heart failure: Secondary | ICD-10-CM | POA: Diagnosis not present

## 2018-03-15 DIAGNOSIS — I5043 Acute on chronic combined systolic (congestive) and diastolic (congestive) heart failure: Secondary | ICD-10-CM | POA: Diagnosis not present

## 2018-03-15 DIAGNOSIS — I2510 Atherosclerotic heart disease of native coronary artery without angina pectoris: Secondary | ICD-10-CM

## 2018-03-15 DIAGNOSIS — E7800 Pure hypercholesterolemia, unspecified: Secondary | ICD-10-CM

## 2018-03-15 DIAGNOSIS — I420 Dilated cardiomyopathy: Secondary | ICD-10-CM | POA: Diagnosis not present

## 2018-03-15 DIAGNOSIS — I442 Atrioventricular block, complete: Secondary | ICD-10-CM | POA: Diagnosis not present

## 2018-03-15 DIAGNOSIS — I10 Essential (primary) hypertension: Secondary | ICD-10-CM

## 2018-03-15 DIAGNOSIS — I130 Hypertensive heart and chronic kidney disease with heart failure and stage 1 through stage 4 chronic kidney disease, or unspecified chronic kidney disease: Secondary | ICD-10-CM | POA: Diagnosis not present

## 2018-03-15 MED ORDER — FUROSEMIDE 40 MG PO TABS: 40.0000 mg | ORAL_TABLET | Freq: Two times a day (BID) | ORAL | 3 refills | Status: AC

## 2018-03-15 NOTE — Patient Instructions (Signed)
Medication Instructions:  Your physician has recommended you make the following change in your medication:  Increase furosemide to 40 mg by mouth twice daily   Labwork: Lab work to be done today--BMP and BNP  Your physician recommends that you return for lab work in: one week--BMP   Testing/Procedures: none  Follow-Up: [facility]  Please schedule appointment for 1-2 weeks from now   Any Other Special Instructions Will Be Listed Below (If Applicable).     If you need a refill on your cardiac medications before your next appointment, please call your pharmacy.
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
## 2018-03-15 NOTE — Telephone Encounter (Signed)
Left vm informing Don Dr. Darnell Level agrees with extending OT.

## 2018-03-16 DIAGNOSIS — I5043 Acute on chronic combined systolic (congestive) and diastolic (congestive) heart failure: Secondary | ICD-10-CM | POA: Diagnosis not present

## 2018-03-16 DIAGNOSIS — I130 Hypertensive heart and chronic kidney disease with heart failure and stage 1 through stage 4 chronic kidney disease, or unspecified chronic kidney disease: Secondary | ICD-10-CM | POA: Diagnosis not present

## 2018-03-16 LAB — BASIC METABOLIC PANEL
BUN/Creatinine Ratio: 20 (ref 10–24)
BUN: 36 mg/dL — AB (ref 8–27)
CALCIUM: 8.9 mg/dL (ref 8.6–10.2)
CHLORIDE: 103 mmol/L (ref 96–106)
CO2: 24 mmol/L (ref 20–29)
Creatinine, Ser: 1.81 mg/dL — ABNORMAL HIGH (ref 0.76–1.27)
GFR calc Af Amer: 39 mL/min/{1.73_m2} — ABNORMAL LOW (ref >59)
GFR calc non Af Amer: 33 mL/min/{1.73_m2} — ABNORMAL LOW (ref >59)
GLUCOSE: 83 mg/dL (ref 65–99)
POTASSIUM: 4.2 mmol/L (ref 3.5–5.2)
Sodium: 144 mmol/L (ref 134–144)

## 2018-03-16 LAB — PRO B NATRIURETIC PEPTIDE: NT-PRO BNP: 3636 pg/mL — AB (ref 0–486)

## 2018-03-19 DIAGNOSIS — I5043 Acute on chronic combined systolic (congestive) and diastolic (congestive) heart failure: Secondary | ICD-10-CM | POA: Diagnosis not present

## 2018-03-19 DIAGNOSIS — I130 Hypertensive heart and chronic kidney disease with heart failure and stage 1 through stage 4 chronic kidney disease, or unspecified chronic kidney disease: Secondary | ICD-10-CM | POA: Diagnosis not present

## 2018-03-21 ENCOUNTER — Ambulatory Visit (HOSPITAL_COMMUNITY)
Admission: RE | Admit: 2018-03-21 | Discharge: 2018-03-21 | Disposition: A | Discharge status: Home / Self Care | Payer: Medicare Other | Source: Ambulatory Visit | Attending: Internal Medicine | Admitting: Internal Medicine

## 2018-03-21 ENCOUNTER — Encounter (HOSPITAL_COMMUNITY): Payer: Self-pay

## 2018-03-21 VITALS — BP 144/64 | HR 60 | Wt 219.0 lb

## 2018-03-21 DIAGNOSIS — N183 Chronic kidney disease, stage 3 (moderate): Secondary | ICD-10-CM | POA: Insufficient documentation

## 2018-03-21 DIAGNOSIS — I2510 Atherosclerotic heart disease of native coronary artery without angina pectoris: Secondary | ICD-10-CM | POA: Insufficient documentation

## 2018-03-21 DIAGNOSIS — R05 Cough: Secondary | ICD-10-CM | POA: Insufficient documentation

## 2018-03-21 DIAGNOSIS — Z79899 Other long term (current) drug therapy: Secondary | ICD-10-CM | POA: Diagnosis not present

## 2018-03-21 DIAGNOSIS — Z7982 Long term (current) use of aspirin: Secondary | ICD-10-CM | POA: Insufficient documentation

## 2018-03-21 DIAGNOSIS — I5032 Chronic diastolic (congestive) heart failure: Secondary | ICD-10-CM | POA: Diagnosis not present

## 2018-03-21 DIAGNOSIS — M1990 Unspecified osteoarthritis, unspecified site: Secondary | ICD-10-CM | POA: Diagnosis not present

## 2018-03-21 DIAGNOSIS — Z8673 Personal history of transient ischemic attack (TIA), and cerebral infarction without residual deficits: Secondary | ICD-10-CM | POA: Insufficient documentation

## 2018-03-21 DIAGNOSIS — I5022 Chronic systolic (congestive) heart failure: Secondary | ICD-10-CM | POA: Diagnosis not present

## 2018-03-21 DIAGNOSIS — E1122 Type 2 diabetes mellitus with diabetic chronic kidney disease: Secondary | ICD-10-CM | POA: Diagnosis not present

## 2018-03-21 DIAGNOSIS — I130 Hypertensive heart and chronic kidney disease with heart failure and stage 1 through stage 4 chronic kidney disease, or unspecified chronic kidney disease: Secondary | ICD-10-CM | POA: Diagnosis not present

## 2018-03-21 DIAGNOSIS — Z794 Long term (current) use of insulin: Secondary | ICD-10-CM | POA: Insufficient documentation

## 2018-03-21 LAB — BASIC METABOLIC PANEL
ANION GAP: 12 (ref 5–15)
BUN: 40 mg/dL — ABNORMAL HIGH (ref 6–20)
CO2: 23 mmol/L (ref 22–32)
CREATININE: 2.18 mg/dL — AB (ref 0.61–1.24)
Calcium: 9 mg/dL (ref 8.9–10.3)
Chloride: 104 mmol/L (ref 101–111)
GFR calc non Af Amer: 26 mL/min — ABNORMAL LOW (ref >60)
GFR, EST AFRICAN AMERICAN: 30 mL/min — AB (ref >60)
Glucose, Bld: 210 mg/dL — ABNORMAL HIGH (ref 65–99)
Potassium: 3.6 mmol/L (ref 3.5–5.1)
SODIUM: 139 mmol/L (ref 135–145)
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
## 2018-03-21 NOTE — Patient Instructions (Addendum)
Follow up as needed.  Will refer to Hospice and [place].  Routine lab work today. Will notify you of abnormal results, otherwise no news is good news!

## 2018-03-22 ENCOUNTER — Other Ambulatory Visit: Payer: Medicare Other

## 2018-03-22 ENCOUNTER — Telehealth: Payer: Self-pay

## 2018-03-22 NOTE — Telephone Encounter (Signed)
Copied from [place]. Topic: Quick Communication - See Telephone Encounter >> Mar 22, 2018  4:22 PM Antonieta Iba C wrote: CRM for notification. See Telephone encounter for: 03/22/18.  Hospice Palliative care called in because they received a referral from Dr. Haroldine Laws and would like to if Dr. Darnell Level would be the attending provider for pt?   Please advise.   CB: [phone] - Amy

## 2018-03-22 NOTE — Telephone Encounter (Signed)
I will be attending thank you.

## 2018-03-23 ENCOUNTER — Other Ambulatory Visit: Payer: Self-pay | Admitting: Family Medicine

## 2018-03-23 ENCOUNTER — Encounter: Payer: Self-pay | Admitting: Family Medicine

## 2018-03-23 ENCOUNTER — Ambulatory Visit (INDEPENDENT_AMBULATORY_CARE_PROVIDER_SITE_OTHER): Payer: Medicare Other | Admitting: Family Medicine

## 2018-03-23 VITALS — BP 130/72 | HR 60 | Temp 97.8°F | Ht 72.0 in | Wt 219.0 lb

## 2018-03-23 DIAGNOSIS — E113293 Type 2 diabetes mellitus with mild nonproliferative diabetic retinopathy without macular edema, bilateral: Secondary | ICD-10-CM | POA: Diagnosis not present

## 2018-03-23 DIAGNOSIS — I2510 Atherosclerotic heart disease of native coronary artery without angina pectoris: Secondary | ICD-10-CM | POA: Diagnosis not present

## 2018-03-23 DIAGNOSIS — F333 Major depressive disorder, recurrent, severe with psychotic symptoms: Secondary | ICD-10-CM | POA: Diagnosis not present

## 2018-03-23 DIAGNOSIS — N183 Chronic kidney disease, stage 3 (moderate): Secondary | ICD-10-CM | POA: Diagnosis not present

## 2018-03-23 DIAGNOSIS — E1143 Type 2 diabetes mellitus with diabetic autonomic (poly)neuropathy: Secondary | ICD-10-CM | POA: Diagnosis not present

## 2018-03-23 DIAGNOSIS — E1122 Type 2 diabetes mellitus with diabetic chronic kidney disease: Secondary | ICD-10-CM

## 2018-03-23 DIAGNOSIS — S069X2S Unspecified intracranial injury with loss of consciousness of 31 minutes to 59 minutes, sequela: Secondary | ICD-10-CM

## 2018-03-23 DIAGNOSIS — Z794 Long term (current) use of insulin: Secondary | ICD-10-CM

## 2018-03-23 DIAGNOSIS — F0390 Unspecified dementia without behavioral disturbance: Secondary | ICD-10-CM

## 2018-03-23 DIAGNOSIS — R296 Repeated falls: Secondary | ICD-10-CM

## 2018-03-23 DIAGNOSIS — E1165 Type 2 diabetes mellitus with hyperglycemia: Secondary | ICD-10-CM | POA: Diagnosis not present

## 2018-03-23 DIAGNOSIS — I5042 Chronic combined systolic (congestive) and diastolic (congestive) heart failure: Secondary | ICD-10-CM | POA: Diagnosis not present

## 2018-03-23 DIAGNOSIS — IMO0002 Reserved for concepts with insufficient information to code with codable children: Secondary | ICD-10-CM

## 2018-03-23 LAB — POCT GLYCOSYLATED HEMOGLOBIN (HGB A1C): Hemoglobin A1C: 7.1

## 2018-03-23 MED ORDER — SERTRALINE HCL 25 MG PO TABS: 25.0000 mg | ORAL_TABLET | Freq: Every day | ORAL | 6 refills | Status: DC

## 2018-03-23 MED ORDER — BASAGLAR KWIKPEN 100 UNIT/ML ~~LOC~~ SOPN: 50.0000 [IU] | PEN_INJECTOR | Freq: Every day | SUBCUTANEOUS | 6 refills | Status: AC

## 2018-03-23 NOTE — Telephone Encounter (Signed)
Spoke with Hospice Palliative Care informing them Dr. Darnell Level will be the attending for the pt.  Verbalizes understanding.

## 2018-03-23 NOTE — Assessment & Plan Note (Signed)
Sertraline refilled.

## 2018-03-23 NOTE — Patient Instructions (Addendum)
Add snack at bedtime - with carb and protein. I agree with hospice evaluation.  Continue current medicines.  Good to see you today Return to see me as needed or in 6 weeks for follow up visit.

## 2018-03-23 NOTE — Assessment & Plan Note (Signed)
Reviewed sugar log, will scan. Few low am readings otherwise overall adequate. Wife manages insulin and keeps good log of sugars. rec add bedtime snack to help prevent AM lows. No insulin changes at this time.

## 2018-03-23 NOTE — Assessment & Plan Note (Signed)
Appreciate cards and advanced CHF clinic care. Has been referred to hospice which I agree is appropriate. Reviewed this with wife and daughter as well as anticipated course of evaluation.

## 2018-03-23 NOTE — Assessment & Plan Note (Signed)
Probably vascular, traumatic brain injury history likely contributes. Pending hospice eval.

## 2018-03-23 NOTE — Assessment & Plan Note (Deleted)
Appreciate cards and advanced CHF clinic care. Has been referred to hospice which I agree is appropriate. Reviewed this with wife and daughter as well as anticipated course of evaluation.

## 2018-03-23 NOTE — Assessment & Plan Note (Signed)
Progressive worsening with increased lasix use. Difficult situation.

## 2018-03-23 NOTE — Progress Notes (Signed)
BP 130/72 (BP Location: Left Arm, Patient Position: Sitting, Cuff Size: Large)   Pulse 60   Temp 97.8 F (36.6 C) (Oral)   Ht 6' (1.829 m)   Wt 219 lb (99.3 kg)   SpO2 96%   BMI 29.70 kg/m    CC: 1 mo f/u visit Subjective:    Patient ID: Frank Simpson, male    DOB: 26-Jun-1931, 84 y.o.   MRN: [id number]  HPI: Frank Simpson is a 84 y.o. male presenting on 03/23/2018 for 1 mo follow-up (Provided recent BS readings. Pt accompanied by wife, Pamala Hurry and daughter-in-law, Shirlean Mylar.)   Here with wife and DIL.   Being followed closely by cardiology and advanced CHF clinic due to worsening CHF. H/o complete heart block s/p permanent pacemaker. Now on lasix 35m bid as well as hydralazine, imdur. CKD limits interventions and management options. No further recommendations for CHF management.   [facility] due to failure to make progress in goals, trouble following recommended plan of care due to cognitive decline from dementia.   Progressive deconditioning, progressive decline since hip fracture, recurrent falls at home. Now essentially full supervision/assist to prevent falls. Referred to hospice earlier this week. Family wants him to stay at home. Poor experience at SNF in the past. Requires full care.   No more falls in the past week. Family has been tying him to wheelchair to prevent falls. Increasing hallucinations noted.   DM - does regularly check sugars, wife brings log which was reviewed and scanned. Compliant with antihyperglycemic regimen which includes: basaglar 50u daily, humalog as per below. 2 low sugars in last few weeks. Denies paresthesias. Last diabetic eye exam 03/2017. Pneumovax: completed. Prevnar: completed. Glucometer brand: accuchek. DSME: not completed. Lab Results  Component Value Date   HGBA1C 7.1 03/23/2018   Diabetic Foot Exam - Simple   No data filed     Lab Results  Component Value Date   MICROALBUR 35.9 (H) 11/01/2017    Insulin regimen: humalog 4 units with breakfast 8 units with lunch and dinner  PLUS: <150: no extra insulin 150-200: 2 units 201-250: 4 units 251-300: 8 units  351-400: 10 units >400: 12 units Basaglar 50u daily in am.   Relevant past medical, surgical, family and social history reviewed and updated as indicated. Interim medical history since our last visit reviewed. Allergies and medications reviewed and updated. Outpatient Medications Prior to Visit  Medication Sig Dispense Refill  . acetaminophen (TYLENOL) 500 MG tablet Take 1 tablet (500 mg total) by mouth every 6 (six) hours as needed for mild pain. 30 tablet 0  . aspirin EC 325 MG tablet Take 1 tablet (325 mg total) by mouth daily. 30 tablet 0  . Blood Glucose Monitoring Suppl (ACCU-CHEK AVIVA) device Use as instructed 1 each 0  . carvedilol (COREG) 6.25 MG tablet Take 1 tablet (6.25 mg total) by mouth 2 (two) times daily with a meal. 180 tablet 3  . fluticasone (FLONASE) 50 MCG/ACT nasal spray Place 1 spray into both nostrils 2 (two) times daily.  2  . furosemide (LASIX) 40 MG tablet Take 1 tablet (40 mg total) by mouth 2 (two) times daily. 60 tablet 3  . glucose blood (ACCU-CHEK AVIVA PLUS) test strip Use to check sugars four times daily E11.43 100 each 12  . hydrALAZINE (APRESOLINE) 25 MG tablet Take 1 tablet (25 mg total) by mouth every 8 (eight) hours. 270 tablet 3  . HYDROcodone-acetaminophen (NORCO/VICODIN) 5-325 MG tablet  Take 1 tablet by mouth every 6 (six) hours as needed for moderate pain. #42 doses 30 tablet 0  . insulin lispro (HUMALOG KWIKPEN) 100 UNIT/ML KiwkPen INJECT 3 UNITS  TID with meals 15 mL 0  . Insulin Pen Needle 32G X 4 MM MISC 1 each by Does not apply route 4 (four) times daily. 200 each 0  . isosorbide mononitrate (IMDUR) 30 MG 24 hr tablet Take 1 tablet (30 mg total) by mouth daily. 90 tablet 3  . Lancets (ACCU-CHEK SOFT TOUCH) lancets Use to check sugars four times daily E11.43 100 each 12  .  Melatonin 5 MG TABS Take 1 tablet (5 mg total) by mouth at bedtime as needed (sleep).  0  . rosuvastatin (CRESTOR) 20 MG tablet Take 1 tablet (20 mg total) by mouth daily. 90 tablet 3  . tamsulosin (FLOMAX) 0.4 MG CAPS capsule Take 1 capsule (0.4 mg total) by mouth daily. 30 capsule 0  . trimethoprim-polymyxin b (POLYTRIM) ophthalmic solution Place 1 drop into the left eye every 6 (six) hours. 10 mL 0  . Insulin Glargine (BASAGLAR KWIKPEN) 100 UNIT/ML SOPN Inject 0.35 mLs (35 Units total) into the skin 2 (two) times daily. 30 mL 0  . sertraline (ZOLOFT) 25 MG tablet Take 1 tablet (25 mg total) by mouth daily. 30 tablet 0   No facility-administered medications prior to visit.      Per HPI unless specifically indicated in ROS section below Review of Systems     Objective:    BP 130/72 (BP Location: Left Arm, Patient Position: Sitting, Cuff Size: Large)   Pulse 60   Temp 97.8 F (36.6 C) (Oral)   Ht 6' (1.829 m)   Wt 219 lb (99.3 kg)   SpO2 96%   BMI 29.70 kg/m   Wt Readings from Last 3 Encounters:  03/23/18 219 lb (99.3 kg)  03/21/18 219 lb (99.3 kg)  03/15/18 221 lb (100.2 kg)    Physical Exam  Constitutional: He appears well-developed and well-nourished. No distress.  Stays in wheelchair, slowed responses but responds to questions  HENT:  Mouth/Throat: Oropharynx is clear and moist. No oropharyngeal exudate.  Cardiovascular: Normal rate, regular rhythm and normal heart sounds.  No murmur heard. Pulmonary/Chest: Effort normal and breath sounds normal. No respiratory distress. He has no wheezes. He has no rales.  Musculoskeletal: He exhibits no edema.  Nursing note and vitals reviewed.  Results for orders placed or performed in visit on 03/23/18  POCT glycosylated hemoglobin (Hb A1C)  Result Value Ref Range   Hemoglobin A1C 7.1       Assessment & Plan:   Problem List Items Addressed This Visit    Chronic combined systolic and diastolic CHF (congestive heart failure)  ([place])    Appreciate cards and advanced CHF clinic care. Has been referred to hospice which I agree is appropriate. Reviewed this with wife and daughter as well as anticipated course of evaluation.      CKD stage 3 due to type 2 diabetes mellitus (HCC)    Progressive worsening with increased lasix use. Difficult situation.       Relevant Medications   Insulin Glargine (BASAGLAR KWIKPEN) 100 UNIT/ML SOPN   Dementia    Probably vascular, traumatic brain injury history likely contributes. Pending hospice eval.       Relevant Medications   sertraline (ZOLOFT) 25 MG tablet   Diabetes mellitus type 2 with retinopathy (HCC)   Relevant Medications   Insulin Glargine (BASAGLAR KWIKPEN) 100 UNIT/ML  SOPN   Frequent falls   MDD (major depressive disorder), recurrent episode, severe (HCC)    Sertraline refilled.       Relevant Medications   sertraline (ZOLOFT) 25 MG tablet   Traumatic brain injury with loss of consciousness of 31 minutes to 59 minutes ([place])   Uncontrolled diabetes mellitus with diabetic autonomic neuropathy, with long-term current use of insulin ([place]) - Primary    Reviewed sugar log, will scan. Few low am readings otherwise overall adequate. Wife manages insulin and keeps good log of sugars. rec add bedtime snack to help prevent AM lows. No insulin changes at this time.       Relevant Medications   Insulin Glargine (BASAGLAR KWIKPEN) 100 UNIT/ML SOPN   Other Relevant Orders   POCT glycosylated hemoglobin (Hb A1C) (Completed)       Meds ordered this encounter  Medications  . Insulin Glargine (BASAGLAR KWIKPEN) 100 UNIT/ML SOPN    Sig: Inject 0.5 mLs (50 Units total) into the skin daily. AM    Dispense:  15 mL    Refill:  6  . sertraline (ZOLOFT) 25 MG tablet    Sig: Take 1 tablet (25 mg total) by mouth daily.    Dispense:  30 tablet    Refill:  6   Orders Placed This Encounter  Procedures  . POCT glycosylated hemoglobin (Hb A1C)    Follow up plan: Return in  about 6 weeks (around 05/04/2018) for follow up visit.  [name], MD

## 2018-03-24 DIAGNOSIS — I10 Essential (primary) hypertension: Secondary | ICD-10-CM | POA: Diagnosis not present

## 2018-03-24 DIAGNOSIS — I25119 Atherosclerotic heart disease of native coronary artery with unspecified angina pectoris: Secondary | ICD-10-CM | POA: Diagnosis not present

## 2018-03-24 DIAGNOSIS — R1310 Dysphagia, unspecified: Secondary | ICD-10-CM | POA: Diagnosis not present

## 2018-03-24 DIAGNOSIS — I679 Cerebrovascular disease, unspecified: Secondary | ICD-10-CM | POA: Diagnosis not present

## 2018-03-24 DIAGNOSIS — F339 Major depressive disorder, recurrent, unspecified: Secondary | ICD-10-CM | POA: Diagnosis not present

## 2018-03-24 DIAGNOSIS — H409 Unspecified glaucoma: Secondary | ICD-10-CM | POA: Diagnosis not present

## 2018-03-24 DIAGNOSIS — E1159 Type 2 diabetes mellitus with other circulatory complications: Secondary | ICD-10-CM | POA: Diagnosis not present

## 2018-03-24 DIAGNOSIS — R630 Anorexia: Secondary | ICD-10-CM | POA: Diagnosis not present

## 2018-03-24 DIAGNOSIS — I442 Atrioventricular block, complete: Secondary | ICD-10-CM | POA: Diagnosis not present

## 2018-03-24 DIAGNOSIS — J301 Allergic rhinitis due to pollen: Secondary | ICD-10-CM | POA: Diagnosis not present

## 2018-03-24 DIAGNOSIS — N183 Chronic kidney disease, stage 3 (moderate): Secondary | ICD-10-CM | POA: Diagnosis not present

## 2018-03-24 DIAGNOSIS — N401 Enlarged prostate with lower urinary tract symptoms: Secondary | ICD-10-CM | POA: Diagnosis not present

## 2018-03-24 DIAGNOSIS — R4182 Altered mental status, unspecified: Secondary | ICD-10-CM | POA: Diagnosis not present

## 2018-03-24 DIAGNOSIS — E785 Hyperlipidemia, unspecified: Secondary | ICD-10-CM | POA: Diagnosis not present

## 2018-03-24 DIAGNOSIS — I5020 Unspecified systolic (congestive) heart failure: Secondary | ICD-10-CM | POA: Diagnosis not present

## 2018-03-26 DIAGNOSIS — I25119 Atherosclerotic heart disease of native coronary artery with unspecified angina pectoris: Secondary | ICD-10-CM | POA: Diagnosis not present

## 2018-03-26 DIAGNOSIS — R630 Anorexia: Secondary | ICD-10-CM | POA: Diagnosis not present

## 2018-03-26 DIAGNOSIS — R1310 Dysphagia, unspecified: Secondary | ICD-10-CM | POA: Diagnosis not present

## 2018-03-26 DIAGNOSIS — I442 Atrioventricular block, complete: Secondary | ICD-10-CM | POA: Diagnosis not present

## 2018-03-26 DIAGNOSIS — I5020 Unspecified systolic (congestive) heart failure: Secondary | ICD-10-CM | POA: Diagnosis not present

## 2018-03-26 DIAGNOSIS — R4182 Altered mental status, unspecified: Secondary | ICD-10-CM | POA: Diagnosis not present

## 2018-03-27 ENCOUNTER — Encounter: Payer: Self-pay | Admitting: Family Medicine

## 2018-03-27 DIAGNOSIS — Z515 Encounter for palliative care: Secondary | ICD-10-CM | POA: Insufficient documentation

## 2018-03-28 DIAGNOSIS — R4182 Altered mental status, unspecified: Secondary | ICD-10-CM | POA: Diagnosis not present

## 2018-03-28 DIAGNOSIS — H409 Unspecified glaucoma: Secondary | ICD-10-CM | POA: Diagnosis not present

## 2018-03-28 DIAGNOSIS — R630 Anorexia: Secondary | ICD-10-CM | POA: Diagnosis not present

## 2018-03-28 DIAGNOSIS — I679 Cerebrovascular disease, unspecified: Secondary | ICD-10-CM | POA: Diagnosis not present

## 2018-03-28 DIAGNOSIS — E1159 Type 2 diabetes mellitus with other circulatory complications: Secondary | ICD-10-CM | POA: Diagnosis not present

## 2018-03-28 DIAGNOSIS — I10 Essential (primary) hypertension: Secondary | ICD-10-CM | POA: Diagnosis not present

## 2018-03-28 DIAGNOSIS — R1310 Dysphagia, unspecified: Secondary | ICD-10-CM | POA: Diagnosis not present

## 2018-03-28 DIAGNOSIS — I442 Atrioventricular block, complete: Secondary | ICD-10-CM | POA: Diagnosis not present

## 2018-03-28 DIAGNOSIS — J301 Allergic rhinitis due to pollen: Secondary | ICD-10-CM | POA: Diagnosis not present

## 2018-03-28 DIAGNOSIS — I5020 Unspecified systolic (congestive) heart failure: Secondary | ICD-10-CM | POA: Diagnosis not present

## 2018-03-28 DIAGNOSIS — N401 Enlarged prostate with lower urinary tract symptoms: Secondary | ICD-10-CM | POA: Diagnosis not present

## 2018-03-28 DIAGNOSIS — I25119 Atherosclerotic heart disease of native coronary artery with unspecified angina pectoris: Secondary | ICD-10-CM | POA: Diagnosis not present

## 2018-03-28 DIAGNOSIS — E785 Hyperlipidemia, unspecified: Secondary | ICD-10-CM | POA: Diagnosis not present

## 2018-03-28 DIAGNOSIS — F339 Major depressive disorder, recurrent, unspecified: Secondary | ICD-10-CM | POA: Diagnosis not present

## 2018-03-28 DIAGNOSIS — N183 Chronic kidney disease, stage 3 (moderate): Secondary | ICD-10-CM | POA: Diagnosis not present

## 2018-04-02 ENCOUNTER — Encounter: Payer: Self-pay | Admitting: Internal Medicine

## 2018-04-02 DIAGNOSIS — I442 Atrioventricular block, complete: Secondary | ICD-10-CM | POA: Diagnosis not present

## 2018-04-02 DIAGNOSIS — R1310 Dysphagia, unspecified: Secondary | ICD-10-CM | POA: Diagnosis not present

## 2018-04-02 DIAGNOSIS — I25119 Atherosclerotic heart disease of native coronary artery with unspecified angina pectoris: Secondary | ICD-10-CM | POA: Diagnosis not present

## 2018-04-02 DIAGNOSIS — R630 Anorexia: Secondary | ICD-10-CM | POA: Diagnosis not present

## 2018-04-02 DIAGNOSIS — R4182 Altered mental status, unspecified: Secondary | ICD-10-CM | POA: Diagnosis not present

## 2018-04-02 DIAGNOSIS — I5020 Unspecified systolic (congestive) heart failure: Secondary | ICD-10-CM | POA: Diagnosis not present

## 2018-04-03 ENCOUNTER — Encounter: Payer: Self-pay | Admitting: Internal Medicine

## 2018-04-03 ENCOUNTER — Ambulatory Visit (INDEPENDENT_AMBULATORY_CARE_PROVIDER_SITE_OTHER): Admitting: Internal Medicine

## 2018-04-03 VITALS — BP 130/76 | HR 72 | Ht 72.0 in | Wt 221.0 lb

## 2018-04-03 DIAGNOSIS — I442 Atrioventricular block, complete: Secondary | ICD-10-CM

## 2018-04-03 DIAGNOSIS — R1310 Dysphagia, unspecified: Secondary | ICD-10-CM | POA: Diagnosis not present

## 2018-04-03 DIAGNOSIS — Z950 Presence of cardiac pacemaker: Secondary | ICD-10-CM | POA: Diagnosis not present

## 2018-04-03 DIAGNOSIS — I519 Heart disease, unspecified: Secondary | ICD-10-CM | POA: Insufficient documentation

## 2018-04-03 DIAGNOSIS — I5032 Chronic diastolic (congestive) heart failure: Secondary | ICD-10-CM | POA: Diagnosis not present

## 2018-04-03 DIAGNOSIS — R4182 Altered mental status, unspecified: Secondary | ICD-10-CM | POA: Diagnosis not present

## 2018-04-03 DIAGNOSIS — I25119 Atherosclerotic heart disease of native coronary artery with unspecified angina pectoris: Secondary | ICD-10-CM | POA: Diagnosis not present

## 2018-04-03 DIAGNOSIS — I5020 Unspecified systolic (congestive) heart failure: Secondary | ICD-10-CM | POA: Diagnosis not present

## 2018-04-03 DIAGNOSIS — R630 Anorexia: Secondary | ICD-10-CM | POA: Diagnosis not present

## 2018-04-03 LAB — CUP PACEART INCLINIC DEVICE CHECK
Battery Remaining Longevity: 115 mo
Battery Voltage: 3.01 V
Date Time Interrogation Session: 20190507102751
Implantable Lead Implant Date: 20151109
Implantable Pulse Generator Implant Date: 20151109
Lead Channel Impedance Value: 537.5 Ohm
Lead Channel Pacing Threshold Amplitude: 0.75 V
Lead Channel Pacing Threshold Amplitude: 0.75 V
Lead Channel Pacing Threshold Amplitude: 1 V
Lead Channel Pacing Threshold Pulse Width: 0.4 ms
Lead Channel Pacing Threshold Pulse Width: 0.4 ms
Lead Channel Sensing Intrinsic Amplitude: 10.8 mV
Lead Channel Sensing Intrinsic Amplitude: 4.4 mV
Lead Channel Setting Pacing Amplitude: 2.5 V
MDC IDC LEAD IMPLANT DT: 20151109
MDC IDC LEAD LOCATION: 753859
MDC IDC LEAD LOCATION: 753860
MDC IDC MSMT LEADCHNL RA PACING THRESHOLD AMPLITUDE: 1 V
MDC IDC MSMT LEADCHNL RA PACING THRESHOLD PULSEWIDTH: 0.4 ms
MDC IDC MSMT LEADCHNL RV IMPEDANCE VALUE: 512.5 Ohm
MDC IDC MSMT LEADCHNL RV PACING THRESHOLD PULSEWIDTH: 0.4 ms
MDC IDC PG SERIAL: 7666171
MDC IDC SET LEADCHNL RA PACING AMPLITUDE: 2 V
MDC IDC SET LEADCHNL RV PACING PULSEWIDTH: 0.4 ms
MDC IDC SET LEADCHNL RV SENSING SENSITIVITY: 2 mV
MDC IDC STAT BRADY RA PERCENT PACED: 33 %
MDC IDC STAT BRADY RV PERCENT PACED: 99 %
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
## 2018-04-03 NOTE — Patient Instructions (Addendum)
Medication Instructions:  Your physician recommends that you continue on your current medications as directed. Please refer to the Current Medication list given to you today.  Labwork: None ordered.  Testing/Procedures: None ordered.  Follow-Up: Your physician wants you to follow-up in: one year with Dr. Lovena Le.   You will receive a reminder letter in the mail two months in advance. If you don't receive a letter, please call our office to schedule the follow-up appointment.  Remote monitoring is used to monitor your Pacemaker from home. This monitoring reduces the number of office visits required to check your device to one time per year. It allows [place] to keep an eye on the functioning of your device to ensure it is working properly. You are scheduled for a device check from home on 07/03/2018. You may send your transmission at any time that day. If you have a wireless device, the transmission will be sent automatically. After your physician reviews your transmission, you will receive a postcard with your next transmission date.  Any Other Special Instructions Will Be Listed Below (If Applicable).  If you need a refill on your cardiac medications before your next appointment, please call your pharmacy.

## 2018-04-09 DIAGNOSIS — R630 Anorexia: Secondary | ICD-10-CM | POA: Diagnosis not present

## 2018-04-09 DIAGNOSIS — I25119 Atherosclerotic heart disease of native coronary artery with unspecified angina pectoris: Secondary | ICD-10-CM | POA: Diagnosis not present

## 2018-04-09 DIAGNOSIS — R4182 Altered mental status, unspecified: Secondary | ICD-10-CM | POA: Diagnosis not present

## 2018-04-09 DIAGNOSIS — R1310 Dysphagia, unspecified: Secondary | ICD-10-CM | POA: Diagnosis not present

## 2018-04-09 DIAGNOSIS — I5020 Unspecified systolic (congestive) heart failure: Secondary | ICD-10-CM | POA: Diagnosis not present

## 2018-04-09 DIAGNOSIS — I442 Atrioventricular block, complete: Secondary | ICD-10-CM | POA: Diagnosis not present

## 2018-04-17 DIAGNOSIS — R630 Anorexia: Secondary | ICD-10-CM | POA: Diagnosis not present

## 2018-04-17 DIAGNOSIS — I25119 Atherosclerotic heart disease of native coronary artery with unspecified angina pectoris: Secondary | ICD-10-CM | POA: Diagnosis not present

## 2018-04-17 DIAGNOSIS — R4182 Altered mental status, unspecified: Secondary | ICD-10-CM | POA: Diagnosis not present

## 2018-04-17 DIAGNOSIS — R1310 Dysphagia, unspecified: Secondary | ICD-10-CM | POA: Diagnosis not present

## 2018-04-17 DIAGNOSIS — I5020 Unspecified systolic (congestive) heart failure: Secondary | ICD-10-CM | POA: Diagnosis not present

## 2018-04-17 DIAGNOSIS — I442 Atrioventricular block, complete: Secondary | ICD-10-CM | POA: Diagnosis not present

## 2018-04-21 ENCOUNTER — Other Ambulatory Visit: Payer: Self-pay | Admitting: Family Medicine

## 2018-04-21 DIAGNOSIS — I25119 Atherosclerotic heart disease of native coronary artery with unspecified angina pectoris: Secondary | ICD-10-CM | POA: Diagnosis not present

## 2018-04-21 DIAGNOSIS — R4182 Altered mental status, unspecified: Secondary | ICD-10-CM | POA: Diagnosis not present

## 2018-04-21 DIAGNOSIS — R630 Anorexia: Secondary | ICD-10-CM | POA: Diagnosis not present

## 2018-04-21 DIAGNOSIS — R1310 Dysphagia, unspecified: Secondary | ICD-10-CM | POA: Diagnosis not present

## 2018-04-21 DIAGNOSIS — F333 Major depressive disorder, recurrent, severe with psychotic symptoms: Secondary | ICD-10-CM

## 2018-04-21 DIAGNOSIS — I442 Atrioventricular block, complete: Secondary | ICD-10-CM | POA: Diagnosis not present

## 2018-04-21 DIAGNOSIS — I5020 Unspecified systolic (congestive) heart failure: Secondary | ICD-10-CM | POA: Diagnosis not present

## 2018-04-24 DIAGNOSIS — R4182 Altered mental status, unspecified: Secondary | ICD-10-CM | POA: Diagnosis not present

## 2018-04-24 DIAGNOSIS — I25119 Atherosclerotic heart disease of native coronary artery with unspecified angina pectoris: Secondary | ICD-10-CM | POA: Diagnosis not present

## 2018-04-24 DIAGNOSIS — I442 Atrioventricular block, complete: Secondary | ICD-10-CM | POA: Diagnosis not present

## 2018-04-24 DIAGNOSIS — R630 Anorexia: Secondary | ICD-10-CM | POA: Diagnosis not present

## 2018-04-24 DIAGNOSIS — I5020 Unspecified systolic (congestive) heart failure: Secondary | ICD-10-CM | POA: Diagnosis not present

## 2018-04-24 DIAGNOSIS — R1310 Dysphagia, unspecified: Secondary | ICD-10-CM | POA: Diagnosis not present

## 2018-04-25 NOTE — Telephone Encounter (Signed)
Last OV: 03/23/18 Last refill: #30, 6 refills on 03/23/18  Will approve change to 90 day supply and provide dx code.  Also, note to pharmacy to cancel previous script.

## 2018-04-28 DIAGNOSIS — I5020 Unspecified systolic (congestive) heart failure: Secondary | ICD-10-CM | POA: Diagnosis not present

## 2018-04-28 DIAGNOSIS — J301 Allergic rhinitis due to pollen: Secondary | ICD-10-CM | POA: Diagnosis not present

## 2018-04-28 DIAGNOSIS — N183 Chronic kidney disease, stage 3 (moderate): Secondary | ICD-10-CM | POA: Diagnosis not present

## 2018-04-28 DIAGNOSIS — I10 Essential (primary) hypertension: Secondary | ICD-10-CM | POA: Diagnosis not present

## 2018-04-28 DIAGNOSIS — E785 Hyperlipidemia, unspecified: Secondary | ICD-10-CM | POA: Diagnosis not present

## 2018-04-28 DIAGNOSIS — I442 Atrioventricular block, complete: Secondary | ICD-10-CM | POA: Diagnosis not present

## 2018-04-28 DIAGNOSIS — F339 Major depressive disorder, recurrent, unspecified: Secondary | ICD-10-CM | POA: Diagnosis not present

## 2018-04-28 DIAGNOSIS — H409 Unspecified glaucoma: Secondary | ICD-10-CM | POA: Diagnosis not present

## 2018-04-28 DIAGNOSIS — I679 Cerebrovascular disease, unspecified: Secondary | ICD-10-CM | POA: Diagnosis not present

## 2018-04-28 DIAGNOSIS — E1159 Type 2 diabetes mellitus with other circulatory complications: Secondary | ICD-10-CM | POA: Diagnosis not present

## 2018-04-28 DIAGNOSIS — N401 Enlarged prostate with lower urinary tract symptoms: Secondary | ICD-10-CM | POA: Diagnosis not present

## 2018-04-28 DIAGNOSIS — R4182 Altered mental status, unspecified: Secondary | ICD-10-CM | POA: Diagnosis not present

## 2018-04-28 DIAGNOSIS — R1310 Dysphagia, unspecified: Secondary | ICD-10-CM | POA: Diagnosis not present

## 2018-04-28 DIAGNOSIS — R630 Anorexia: Secondary | ICD-10-CM | POA: Diagnosis not present

## 2018-04-28 DIAGNOSIS — I25119 Atherosclerotic heart disease of native coronary artery with unspecified angina pectoris: Secondary | ICD-10-CM | POA: Diagnosis not present

## 2018-04-30 ENCOUNTER — Encounter: Payer: Self-pay | Admitting: Podiatry

## 2018-04-30 ENCOUNTER — Ambulatory Visit (INDEPENDENT_AMBULATORY_CARE_PROVIDER_SITE_OTHER): Payer: Medicare Other | Admitting: Podiatry

## 2018-04-30 DIAGNOSIS — I5020 Unspecified systolic (congestive) heart failure: Secondary | ICD-10-CM | POA: Diagnosis not present

## 2018-04-30 DIAGNOSIS — B351 Tinea unguium: Secondary | ICD-10-CM

## 2018-04-30 DIAGNOSIS — R630 Anorexia: Secondary | ICD-10-CM | POA: Diagnosis not present

## 2018-04-30 DIAGNOSIS — R1310 Dysphagia, unspecified: Secondary | ICD-10-CM | POA: Diagnosis not present

## 2018-04-30 DIAGNOSIS — M79674 Pain in right toe(s): Secondary | ICD-10-CM | POA: Diagnosis not present

## 2018-04-30 DIAGNOSIS — M79675 Pain in left toe(s): Secondary | ICD-10-CM | POA: Diagnosis not present

## 2018-04-30 DIAGNOSIS — I25119 Atherosclerotic heart disease of native coronary artery with unspecified angina pectoris: Secondary | ICD-10-CM | POA: Diagnosis not present

## 2018-04-30 DIAGNOSIS — I442 Atrioventricular block, complete: Secondary | ICD-10-CM | POA: Diagnosis not present

## 2018-04-30 DIAGNOSIS — E1142 Type 2 diabetes mellitus with diabetic polyneuropathy: Secondary | ICD-10-CM

## 2018-04-30 DIAGNOSIS — R4182 Altered mental status, unspecified: Secondary | ICD-10-CM | POA: Diagnosis not present

## 2018-04-30 LAB — HM DIABETES FOOT EXAM

## 2018-04-30 NOTE — Progress Notes (Signed)
Complaint:  Visit Type: Patient returns to my office for continued preventative foot care services. Complaint: Patient states" my nails have grown long and thick and become painful to walk and wear shoes" Patient has been diagnosed with DM with no foot complications. The patient presents for preventative foot care services. No changes to ROS.  Patient has not been seen in over 5 months.  Podiatric Exam: Vascular: dorsalis pedis and posterior tibial pulses are palpable bilateral. Capillary return is immediate. Temperature gradient is WNL. Skin turgor WNL  Sensorium: Diminished  Semmes Weinstein monofilament test. Normal tactile sensation bilaterally. Nail Exam: Pt has thick disfigured discolored nails with subungual debris noted bilateral entire nail hallux through fifth toenails Ulcer Exam: There is no evidence of ulcer or pre-ulcerative changes or infection. Orthopedic Exam: Muscle tone and strength are WNL. No limitations in general ROM. No crepitus or effusions noted. Foot type and digits show no abnormalities. Bony prominences are unremarkable. Skin: No Porokeratosis. No infection or ulcers  Diagnosis:  Onychomycosis, , Pain in right toe, pain in left toes  Treatment & Plan Procedures and Treatment: Consent by patient was obtained for treatment procedures.   Debridement of mycotic and hypertrophic toenails, 1 through 5 bilateral and clearing of subungual debris. No ulceration, no infection noted.  Return Visit-Office Procedure: Patient instructed to return to the office for a follow up visit 3 months for continued evaluation and treatment.    Gardiner Barefoot DPM

## 2018-05-07 ENCOUNTER — Ambulatory Visit (INDEPENDENT_AMBULATORY_CARE_PROVIDER_SITE_OTHER): Payer: Medicare Other | Admitting: Family Medicine

## 2018-05-07 ENCOUNTER — Encounter: Payer: Self-pay | Admitting: Family Medicine

## 2018-05-07 VITALS — BP 134/80 | HR 61 | Temp 97.8°F | Ht 72.0 in | Wt 220.2 lb

## 2018-05-07 DIAGNOSIS — S72141A Displaced intertrochanteric fracture of right femur, initial encounter for closed fracture: Secondary | ICD-10-CM | POA: Diagnosis not present

## 2018-05-07 DIAGNOSIS — E113293 Type 2 diabetes mellitus with mild nonproliferative diabetic retinopathy without macular edema, bilateral: Secondary | ICD-10-CM

## 2018-05-07 DIAGNOSIS — E1122 Type 2 diabetes mellitus with diabetic chronic kidney disease: Secondary | ICD-10-CM

## 2018-05-07 DIAGNOSIS — F0150 Vascular dementia without behavioral disturbance: Secondary | ICD-10-CM | POA: Diagnosis not present

## 2018-05-07 DIAGNOSIS — N183 Chronic kidney disease, stage 3 unspecified: Secondary | ICD-10-CM

## 2018-05-07 DIAGNOSIS — I5042 Chronic combined systolic (congestive) and diastolic (congestive) heart failure: Secondary | ICD-10-CM

## 2018-05-07 DIAGNOSIS — Z515 Encounter for palliative care: Secondary | ICD-10-CM

## 2018-05-07 DIAGNOSIS — E1140 Type 2 diabetes mellitus with diabetic neuropathy, unspecified: Secondary | ICD-10-CM

## 2018-05-07 DIAGNOSIS — Z794 Long term (current) use of insulin: Secondary | ICD-10-CM

## 2018-05-07 MED ORDER — INSULIN PEN NEEDLE 32G X 4 MM MISC: 1.0000 | Freq: Four times a day (QID) | 0 refills | Status: AC

## 2018-05-07 MED ORDER — INSULIN LISPRO 100 UNIT/ML (KWIKPEN): PEN_INJECTOR | SUBCUTANEOUS | 1 refills | Status: AC

## 2018-05-07 NOTE — Patient Instructions (Addendum)
Buy lubricating eye drops (visine, refresh, or systane).  humalog insulin refilled.  Continue current medicines.  Return in 2 months for follow up visit.

## 2018-05-07 NOTE — Assessment & Plan Note (Signed)
Check Cr next labwork.

## 2018-05-07 NOTE — Assessment & Plan Note (Signed)
S/p CHF clinic eval. Continue lasix 95m bid.

## 2018-05-07 NOTE — Assessment & Plan Note (Addendum)
Hospice now involved, appreciate their care. Continue sertraline 40m daily.

## 2018-05-07 NOTE — Assessment & Plan Note (Addendum)
Reviewed sugar log, will scan. No more low sugars below 80. He has had elevated readings to 300s, attributed to dietary choices (desserts on those days etc). Latest A1c with adequate control for age and comorbidities. Continue current regimen. Saw podiatrist last week - will update chart with foot exam done then.[name] Kitchen

## 2018-05-07 NOTE — Assessment & Plan Note (Signed)
Appreciate hospice care.

## 2018-05-07 NOTE — Progress Notes (Signed)
BP 134/80 (BP Location: Right Arm, Patient Position: Sitting, Cuff Size: Large)   Pulse 61   Temp 97.8 F (36.6 C) (Oral)   Ht 6' (1.829 m)   Wt 220 lb 4 oz (99.9 kg)   SpO2 98%   BMI 29.87 kg/m    CC: 6wk f/u visit Subjective:    Patient ID: Frank Simpson, male    DOB: [birth year], 84 y.o.   MRN: [id number]  HPI: Frank Simpson is a 84 y.o. male presenting on 05/07/2018 for Follow-up (Here for 6 wk f/u. Pt provided recent BS log. Pt accompanied by wife, and daughter-in-law.)   Here with wife and family member. No more falls recently.   Hospice RN Apolonio Schneiders coming out to the house as well as SW and Clinical biochemist.  Episode of chills on Memorial day weekend. Doing ok since then.   DM - regularly checks sugars and brings log which was reviewed and scanned.  Lab Results  Component Value Date   HGBA1C 7.1 03/23/2018   Asking about refill of polytrim eye drops. Eye looking better.  Relevant past medical, surgical, family and social history reviewed and updated as indicated. Interim medical history since our last visit reviewed. Allergies and medications reviewed and updated. Outpatient Medications Prior to Visit  Medication Sig Dispense Refill  . acetaminophen (TYLENOL) 500 MG tablet Take 1 tablet (500 mg total) by mouth every 6 (six) hours as needed for mild pain. 30 tablet 0  . aspirin EC 325 MG tablet Take 1 tablet (325 mg total) by mouth daily. 30 tablet 0  . Blood Glucose Monitoring Suppl (ACCU-CHEK AVIVA) device Use as instructed 1 each 0  . carvedilol (COREG) 6.25 MG tablet Take 1 tablet (6.25 mg total) by mouth 2 (two) times daily with a meal. 180 tablet 3  . fluticasone (FLONASE) 50 MCG/ACT nasal spray Place 1 spray into both nostrils 2 (two) times daily.  2  . furosemide (LASIX) 40 MG tablet Take 1 tablet (40 mg total) by mouth 2 (two) times daily. 60 tablet 3  . glucose blood (ACCU-CHEK AVIVA PLUS) test strip Use to check sugars four times daily E11.43 100 each 12  .  hydrALAZINE (APRESOLINE) 25 MG tablet Take 1 tablet (25 mg total) by mouth every 8 (eight) hours. 270 tablet 3  . HYDROcodone-acetaminophen (NORCO/VICODIN) 5-325 MG tablet Take 1 tablet by mouth every 6 (six) hours as needed for moderate pain. #42 doses 30 tablet 0  . Insulin Glargine (BASAGLAR KWIKPEN) 100 UNIT/ML SOPN Inject 0.5 mLs (50 Units total) into the skin daily. AM 15 mL 6  . isosorbide mononitrate (IMDUR) 30 MG 24 hr tablet Take 1 tablet (30 mg total) by mouth daily. 90 tablet 3  . Lancets (ACCU-CHEK SOFT TOUCH) lancets Use to check sugars four times daily E11.43 100 each 12  . rosuvastatin (CRESTOR) 20 MG tablet Take 1 tablet (20 mg total) by mouth daily. 90 tablet 3  . sertraline (ZOLOFT) 25 MG tablet Take 1 tablet by mouth every day. 90 tablet 1  . tamsulosin (FLOMAX) 0.4 MG CAPS capsule Take 1 capsule (0.4 mg total) by mouth daily. 30 capsule 0  . insulin lispro (HUMALOG KWIKPEN) 100 UNIT/ML KiwkPen INJECT 3 UNITS  TID with meals 15 mL 0  . Insulin Pen Needle 32G X 4 MM MISC 1 each by Does not apply route 4 (four) times daily. 200 each 0  . Melatonin 5 MG TABS Take 1 tablet (5 mg total) by mouth at bedtime  as needed (sleep).  0  . trimethoprim-polymyxin b (POLYTRIM) ophthalmic solution Place 1 drop into the left eye every 6 (six) hours. 10 mL 0   No facility-administered medications prior to visit.      Per HPI unless specifically indicated in ROS section below Review of Systems     Objective:    BP 134/80 (BP Location: Right Arm, Patient Position: Sitting, Cuff Size: Large)   Pulse 61   Temp 97.8 F (36.6 C) (Oral)   Ht 6' (1.829 m)   Wt 220 lb 4 oz (99.9 kg)   SpO2 98%   BMI 29.87 kg/m   Wt Readings from Last 3 Encounters:  05/07/18 220 lb 4 oz (99.9 kg)  04/03/18 221 lb (100.2 kg)  03/23/18 219 lb (99.3 kg)    Physical Exam  Constitutional: He appears well-developed and well-nourished. No distress.  Sitting in wheelchair Slowed responses, family provides  history  Cardiovascular: Normal rate and regular rhythm.  Murmur (3/6 systolic) heard. Pulmonary/Chest: Effort normal. No respiratory distress. He has decreased breath sounds. He has no wheezes. He has no rales.  Crackles bibasilarly  Musculoskeletal: He exhibits edema (1+ bilat).  Nursing note and vitals reviewed.      Assessment & Plan:  Recommend d/c polytrim eye drops, start OTC lubricating eye drops.  Problem List Items Addressed This Visit    Vascular dementia    Hospice now involved, appreciate their care. Continue sertraline 53m daily.       Hospice care patient    Appreciate hospice care.      Displaced intertrochanteric fracture of right femur, initial encounter for closed fracture ([facility]    Did not progress with physical therapy      Diabetes mellitus type 2 with retinopathy (H[facility])   Relevant Medications   insulin lispro (HUMALOG KWIKPEN) 100 UNIT/ML KiwkPen   Insulin Pen Needle 32G X 4 MM MISC   Controlled diabetes mellitus with diabetic neuropathy, with long-term current use of insulin ([facility]    Reviewed sugar log, will scan. No more low sugars below 80. He has had elevated readings to 300s, attributed to dietary choices (desserts on those days etc). Latest A1c with adequate control for age and comorbidities. Continue current regimen. Saw podiatrist last week - will update chart with foot exam done then..       Relevant Medications   insulin lispro (HUMALOG KWIKPEN) 100 UNIT/ML KiwkPen   Insulin Pen Needle 32G X 4 MM MISC   CKD stage 3 due to type 2 diabetes mellitus ([facility]    Check Cr next labwork.       Relevant Medications   insulin lispro (HUMALOG KWIKPEN) 100 UNIT/ML KiwkPen   Chronic combined systolic and diastolic CHF (congestive heart failure) (H[facility])    S/p CHF clinic eval. Continue lasix 470mbid.           Meds ordered this encounter  Medications  . insulin lispro (HUMALOG KWIKPEN) 100 UNIT/ML KiwkPen    Sig: INJECT 3 UNITS  TID with  meals + sliding scale    Dispense:  15 mL    Refill:  1  . Insulin Pen Needle 32G X 4 MM MISC    Sig: 1 each by Does not apply route 4 (four) times daily.    Dispense:  200 each    Refill:  0   Orders Placed This Encounter  Procedures  . HM DIABETES FOOT EXAM    This external order was created through the Results  Console.    Follow up plan: Return in about 2 months (around 07/07/2018) for follow up visit.  [name], MD

## 2018-05-07 NOTE — Assessment & Plan Note (Addendum)
Did not progress with physical therapy

## 2018-05-08 DIAGNOSIS — I25119 Atherosclerotic heart disease of native coronary artery with unspecified angina pectoris: Secondary | ICD-10-CM | POA: Diagnosis not present

## 2018-05-08 DIAGNOSIS — R1310 Dysphagia, unspecified: Secondary | ICD-10-CM | POA: Diagnosis not present

## 2018-05-08 DIAGNOSIS — I442 Atrioventricular block, complete: Secondary | ICD-10-CM | POA: Diagnosis not present

## 2018-05-08 DIAGNOSIS — I5020 Unspecified systolic (congestive) heart failure: Secondary | ICD-10-CM | POA: Diagnosis not present

## 2018-05-08 DIAGNOSIS — R4182 Altered mental status, unspecified: Secondary | ICD-10-CM | POA: Diagnosis not present

## 2018-05-08 DIAGNOSIS — R630 Anorexia: Secondary | ICD-10-CM | POA: Diagnosis not present

## 2018-05-14 DIAGNOSIS — I442 Atrioventricular block, complete: Secondary | ICD-10-CM | POA: Diagnosis not present

## 2018-05-14 DIAGNOSIS — R1310 Dysphagia, unspecified: Secondary | ICD-10-CM | POA: Diagnosis not present

## 2018-05-14 DIAGNOSIS — R630 Anorexia: Secondary | ICD-10-CM | POA: Diagnosis not present

## 2018-05-14 DIAGNOSIS — R4182 Altered mental status, unspecified: Secondary | ICD-10-CM | POA: Diagnosis not present

## 2018-05-14 DIAGNOSIS — I25119 Atherosclerotic heart disease of native coronary artery with unspecified angina pectoris: Secondary | ICD-10-CM | POA: Diagnosis not present

## 2018-05-14 DIAGNOSIS — I5020 Unspecified systolic (congestive) heart failure: Secondary | ICD-10-CM | POA: Diagnosis not present

## 2018-05-21 DIAGNOSIS — I25119 Atherosclerotic heart disease of native coronary artery with unspecified angina pectoris: Secondary | ICD-10-CM | POA: Diagnosis not present

## 2018-05-21 DIAGNOSIS — I442 Atrioventricular block, complete: Secondary | ICD-10-CM | POA: Diagnosis not present

## 2018-05-21 DIAGNOSIS — R630 Anorexia: Secondary | ICD-10-CM | POA: Diagnosis not present

## 2018-05-21 DIAGNOSIS — R1310 Dysphagia, unspecified: Secondary | ICD-10-CM | POA: Diagnosis not present

## 2018-05-21 DIAGNOSIS — R4182 Altered mental status, unspecified: Secondary | ICD-10-CM | POA: Diagnosis not present

## 2018-05-21 DIAGNOSIS — I5020 Unspecified systolic (congestive) heart failure: Secondary | ICD-10-CM | POA: Diagnosis not present

## 2018-05-25 DIAGNOSIS — I5020 Unspecified systolic (congestive) heart failure: Secondary | ICD-10-CM | POA: Diagnosis not present

## 2018-05-25 DIAGNOSIS — R4182 Altered mental status, unspecified: Secondary | ICD-10-CM | POA: Diagnosis not present

## 2018-05-25 DIAGNOSIS — R1310 Dysphagia, unspecified: Secondary | ICD-10-CM | POA: Diagnosis not present

## 2018-05-25 DIAGNOSIS — R630 Anorexia: Secondary | ICD-10-CM | POA: Diagnosis not present

## 2018-05-25 DIAGNOSIS — I442 Atrioventricular block, complete: Secondary | ICD-10-CM | POA: Diagnosis not present

## 2018-05-25 DIAGNOSIS — I25119 Atherosclerotic heart disease of native coronary artery with unspecified angina pectoris: Secondary | ICD-10-CM | POA: Diagnosis not present

## 2018-05-28 DIAGNOSIS — I25119 Atherosclerotic heart disease of native coronary artery with unspecified angina pectoris: Secondary | ICD-10-CM | POA: Diagnosis not present

## 2018-05-28 DIAGNOSIS — N401 Enlarged prostate with lower urinary tract symptoms: Secondary | ICD-10-CM | POA: Diagnosis not present

## 2018-05-28 DIAGNOSIS — E1159 Type 2 diabetes mellitus with other circulatory complications: Secondary | ICD-10-CM | POA: Diagnosis not present

## 2018-05-28 DIAGNOSIS — E785 Hyperlipidemia, unspecified: Secondary | ICD-10-CM | POA: Diagnosis not present

## 2018-05-28 DIAGNOSIS — R1310 Dysphagia, unspecified: Secondary | ICD-10-CM | POA: Diagnosis not present

## 2018-05-28 DIAGNOSIS — N183 Chronic kidney disease, stage 3 (moderate): Secondary | ICD-10-CM | POA: Diagnosis not present

## 2018-05-28 DIAGNOSIS — F339 Major depressive disorder, recurrent, unspecified: Secondary | ICD-10-CM | POA: Diagnosis not present

## 2018-05-28 DIAGNOSIS — J301 Allergic rhinitis due to pollen: Secondary | ICD-10-CM | POA: Diagnosis not present

## 2018-05-28 DIAGNOSIS — I679 Cerebrovascular disease, unspecified: Secondary | ICD-10-CM | POA: Diagnosis not present

## 2018-05-28 DIAGNOSIS — I5020 Unspecified systolic (congestive) heart failure: Secondary | ICD-10-CM | POA: Diagnosis not present

## 2018-05-28 DIAGNOSIS — H409 Unspecified glaucoma: Secondary | ICD-10-CM | POA: Diagnosis not present

## 2018-05-28 DIAGNOSIS — R4182 Altered mental status, unspecified: Secondary | ICD-10-CM | POA: Diagnosis not present

## 2018-05-28 DIAGNOSIS — R630 Anorexia: Secondary | ICD-10-CM | POA: Diagnosis not present

## 2018-05-28 DIAGNOSIS — I10 Essential (primary) hypertension: Secondary | ICD-10-CM | POA: Diagnosis not present

## 2018-05-28 DIAGNOSIS — I442 Atrioventricular block, complete: Secondary | ICD-10-CM | POA: Diagnosis not present

## 2018-06-04 DIAGNOSIS — R1310 Dysphagia, unspecified: Secondary | ICD-10-CM | POA: Diagnosis not present

## 2018-06-04 DIAGNOSIS — R630 Anorexia: Secondary | ICD-10-CM | POA: Diagnosis not present

## 2018-06-04 DIAGNOSIS — I5020 Unspecified systolic (congestive) heart failure: Secondary | ICD-10-CM | POA: Diagnosis not present

## 2018-06-04 DIAGNOSIS — I25119 Atherosclerotic heart disease of native coronary artery with unspecified angina pectoris: Secondary | ICD-10-CM | POA: Diagnosis not present

## 2018-06-04 DIAGNOSIS — I442 Atrioventricular block, complete: Secondary | ICD-10-CM | POA: Diagnosis not present

## 2018-06-04 DIAGNOSIS — R4182 Altered mental status, unspecified: Secondary | ICD-10-CM | POA: Diagnosis not present

## 2018-06-11 DIAGNOSIS — R4182 Altered mental status, unspecified: Secondary | ICD-10-CM | POA: Diagnosis not present

## 2018-06-11 DIAGNOSIS — I5020 Unspecified systolic (congestive) heart failure: Secondary | ICD-10-CM | POA: Diagnosis not present

## 2018-06-11 DIAGNOSIS — R1310 Dysphagia, unspecified: Secondary | ICD-10-CM | POA: Diagnosis not present

## 2018-06-11 DIAGNOSIS — I25119 Atherosclerotic heart disease of native coronary artery with unspecified angina pectoris: Secondary | ICD-10-CM | POA: Diagnosis not present

## 2018-06-11 DIAGNOSIS — R630 Anorexia: Secondary | ICD-10-CM | POA: Diagnosis not present

## 2018-06-11 DIAGNOSIS — I442 Atrioventricular block, complete: Secondary | ICD-10-CM | POA: Diagnosis not present

## 2018-06-14 ENCOUNTER — Telehealth: Payer: Self-pay

## 2018-06-14 NOTE — Telephone Encounter (Signed)
Copied from [place] [id number]. Topic: Inquiry >> Jun 14, 2018  2:44 PM Pricilla Handler wrote: Reason for CRM: Apolonio Schneiders with Hospice [phone]) called requesting a verbal order for patient to continue with Hospice. Please call Apolonio Schneiders with Hospice at [phone].       Thank You!!!

## 2018-06-14 NOTE — Telephone Encounter (Signed)
Agree with this. Thank you.

## 2018-06-15 NOTE — Telephone Encounter (Signed)
Left message on vm for [facility] with Hospice informing her Dr. Darnell Level agrees with order to continue Hospice.

## 2018-06-18 DIAGNOSIS — R1310 Dysphagia, unspecified: Secondary | ICD-10-CM | POA: Diagnosis not present

## 2018-06-18 DIAGNOSIS — R630 Anorexia: Secondary | ICD-10-CM | POA: Diagnosis not present

## 2018-06-18 DIAGNOSIS — R4182 Altered mental status, unspecified: Secondary | ICD-10-CM | POA: Diagnosis not present

## 2018-06-18 DIAGNOSIS — I5020 Unspecified systolic (congestive) heart failure: Secondary | ICD-10-CM | POA: Diagnosis not present

## 2018-06-18 DIAGNOSIS — I25119 Atherosclerotic heart disease of native coronary artery with unspecified angina pectoris: Secondary | ICD-10-CM | POA: Diagnosis not present

## 2018-06-18 DIAGNOSIS — I442 Atrioventricular block, complete: Secondary | ICD-10-CM | POA: Diagnosis not present

## 2018-06-25 DIAGNOSIS — R630 Anorexia: Secondary | ICD-10-CM | POA: Diagnosis not present

## 2018-06-25 DIAGNOSIS — I442 Atrioventricular block, complete: Secondary | ICD-10-CM | POA: Diagnosis not present

## 2018-06-25 DIAGNOSIS — I25119 Atherosclerotic heart disease of native coronary artery with unspecified angina pectoris: Secondary | ICD-10-CM | POA: Diagnosis not present

## 2018-06-25 DIAGNOSIS — R4182 Altered mental status, unspecified: Secondary | ICD-10-CM | POA: Diagnosis not present

## 2018-06-25 DIAGNOSIS — R1310 Dysphagia, unspecified: Secondary | ICD-10-CM | POA: Diagnosis not present

## 2018-06-25 DIAGNOSIS — I5020 Unspecified systolic (congestive) heart failure: Secondary | ICD-10-CM | POA: Diagnosis not present

## 2018-06-28 DIAGNOSIS — F339 Major depressive disorder, recurrent, unspecified: Secondary | ICD-10-CM | POA: Diagnosis not present

## 2018-06-28 DIAGNOSIS — N401 Enlarged prostate with lower urinary tract symptoms: Secondary | ICD-10-CM | POA: Diagnosis not present

## 2018-06-28 DIAGNOSIS — E1159 Type 2 diabetes mellitus with other circulatory complications: Secondary | ICD-10-CM | POA: Diagnosis not present

## 2018-06-28 DIAGNOSIS — N183 Chronic kidney disease, stage 3 (moderate): Secondary | ICD-10-CM | POA: Diagnosis not present

## 2018-06-28 DIAGNOSIS — I10 Essential (primary) hypertension: Secondary | ICD-10-CM | POA: Diagnosis not present

## 2018-06-28 DIAGNOSIS — R1310 Dysphagia, unspecified: Secondary | ICD-10-CM | POA: Diagnosis not present

## 2018-06-28 DIAGNOSIS — I679 Cerebrovascular disease, unspecified: Secondary | ICD-10-CM | POA: Diagnosis not present

## 2018-06-28 DIAGNOSIS — I442 Atrioventricular block, complete: Secondary | ICD-10-CM | POA: Diagnosis not present

## 2018-06-28 DIAGNOSIS — R4182 Altered mental status, unspecified: Secondary | ICD-10-CM | POA: Diagnosis not present

## 2018-06-28 DIAGNOSIS — R630 Anorexia: Secondary | ICD-10-CM | POA: Diagnosis not present

## 2018-06-28 DIAGNOSIS — I25119 Atherosclerotic heart disease of native coronary artery with unspecified angina pectoris: Secondary | ICD-10-CM | POA: Diagnosis not present

## 2018-06-28 DIAGNOSIS — H409 Unspecified glaucoma: Secondary | ICD-10-CM | POA: Diagnosis not present

## 2018-06-28 DIAGNOSIS — I5020 Unspecified systolic (congestive) heart failure: Secondary | ICD-10-CM | POA: Diagnosis not present

## 2018-06-28 DIAGNOSIS — J301 Allergic rhinitis due to pollen: Secondary | ICD-10-CM | POA: Diagnosis not present

## 2018-06-28 DIAGNOSIS — E785 Hyperlipidemia, unspecified: Secondary | ICD-10-CM | POA: Diagnosis not present

## 2018-07-02 DIAGNOSIS — I5020 Unspecified systolic (congestive) heart failure: Secondary | ICD-10-CM | POA: Diagnosis not present

## 2018-07-02 DIAGNOSIS — I25119 Atherosclerotic heart disease of native coronary artery with unspecified angina pectoris: Secondary | ICD-10-CM | POA: Diagnosis not present

## 2018-07-02 DIAGNOSIS — R630 Anorexia: Secondary | ICD-10-CM | POA: Diagnosis not present

## 2018-07-02 DIAGNOSIS — R1310 Dysphagia, unspecified: Secondary | ICD-10-CM | POA: Diagnosis not present

## 2018-07-02 DIAGNOSIS — I442 Atrioventricular block, complete: Secondary | ICD-10-CM | POA: Diagnosis not present

## 2018-07-02 DIAGNOSIS — R4182 Altered mental status, unspecified: Secondary | ICD-10-CM | POA: Diagnosis not present

## 2018-07-03 ENCOUNTER — Encounter: Payer: Medicare Other | Admitting: *Deleted

## 2018-07-03 DIAGNOSIS — I25119 Atherosclerotic heart disease of native coronary artery with unspecified angina pectoris: Secondary | ICD-10-CM | POA: Diagnosis not present

## 2018-07-03 DIAGNOSIS — R4182 Altered mental status, unspecified: Secondary | ICD-10-CM | POA: Diagnosis not present

## 2018-07-03 DIAGNOSIS — I442 Atrioventricular block, complete: Secondary | ICD-10-CM | POA: Diagnosis not present

## 2018-07-03 DIAGNOSIS — R630 Anorexia: Secondary | ICD-10-CM | POA: Diagnosis not present

## 2018-07-03 DIAGNOSIS — R1310 Dysphagia, unspecified: Secondary | ICD-10-CM | POA: Diagnosis not present

## 2018-07-03 DIAGNOSIS — I5020 Unspecified systolic (congestive) heart failure: Secondary | ICD-10-CM | POA: Diagnosis not present

## 2018-07-04 ENCOUNTER — Ambulatory Visit: Payer: Self-pay | Admitting: Family Medicine

## 2018-07-04 DIAGNOSIS — R630 Anorexia: Secondary | ICD-10-CM | POA: Diagnosis not present

## 2018-07-04 DIAGNOSIS — R4182 Altered mental status, unspecified: Secondary | ICD-10-CM | POA: Diagnosis not present

## 2018-07-04 DIAGNOSIS — I442 Atrioventricular block, complete: Secondary | ICD-10-CM | POA: Diagnosis not present

## 2018-07-04 DIAGNOSIS — R1310 Dysphagia, unspecified: Secondary | ICD-10-CM | POA: Diagnosis not present

## 2018-07-04 DIAGNOSIS — I25119 Atherosclerotic heart disease of native coronary artery with unspecified angina pectoris: Secondary | ICD-10-CM | POA: Diagnosis not present

## 2018-07-04 DIAGNOSIS — I5020 Unspecified systolic (congestive) heart failure: Secondary | ICD-10-CM | POA: Diagnosis not present

## 2018-07-05 NOTE — Telephone Encounter (Signed)
Spoke with wife, expressed my condolences. Plz cancel upcoming appts. Death certificate filled and placed in my out box.

## 2018-07-10 ENCOUNTER — Ambulatory Visit: Payer: Self-pay | Admitting: Family Medicine

## 2018-07-20 ENCOUNTER — Other Ambulatory Visit: Payer: Self-pay | Admitting: Family Medicine

## 2018-07-29 NOTE — Telephone Encounter (Signed)
Received phone call from Frederica Kuster that pt is deceased. She pronounced pt at [id number]. Ms. Caprice Beaver stated that she needed a doctor to pronounce death so the body can be released to the funeral home.  Called Dr Ronnald Ramp at  [phone] and left message. [place] and spoke with Shirlean Mylar who will forward to [place].  Tiffany McKinney's number is [phone]. The funeral is at the pt's residence and is preparing to transport pt to the [facility] in [place]. The number at the funeral [phone]. Routing high priority.  Reason for Disposition . Caller reports that hospice patient died  Answer Assessment - Initial Assessment Questions 1. DEATH: "When did the patient die?"     0653 2. CALLER's RELATIONSHIP: "How are you related to patient?"     [facility] [place]  Protocols used: HOSPICE - DEATH-A-AH

## 2018-07-29 NOTE — Telephone Encounter (Signed)
I spoke with Tiffany at [facility] and she said she needed an order to pronounce and release to funeral home but since the initial call she had gotten that order from the hospice physician so at this time nothing further is needed. FYI to Dr Danise Mina about pts death.

## 2018-07-29 DEATH — deceased

## 2018-08-02 ENCOUNTER — Ambulatory Visit: Admitting: Podiatry

## 2019-06-25 IMAGING — DX DG HAND COMPLETE 3+V*R*
3 series · 3 of 3 positions shown · non-contrast
Comparison: None.

CLINICAL DATA: 87 y/o  M; fall with pain.

EXAM:
RIGHT HAND - COMPLETE 3+ VIEW

[hand ap]
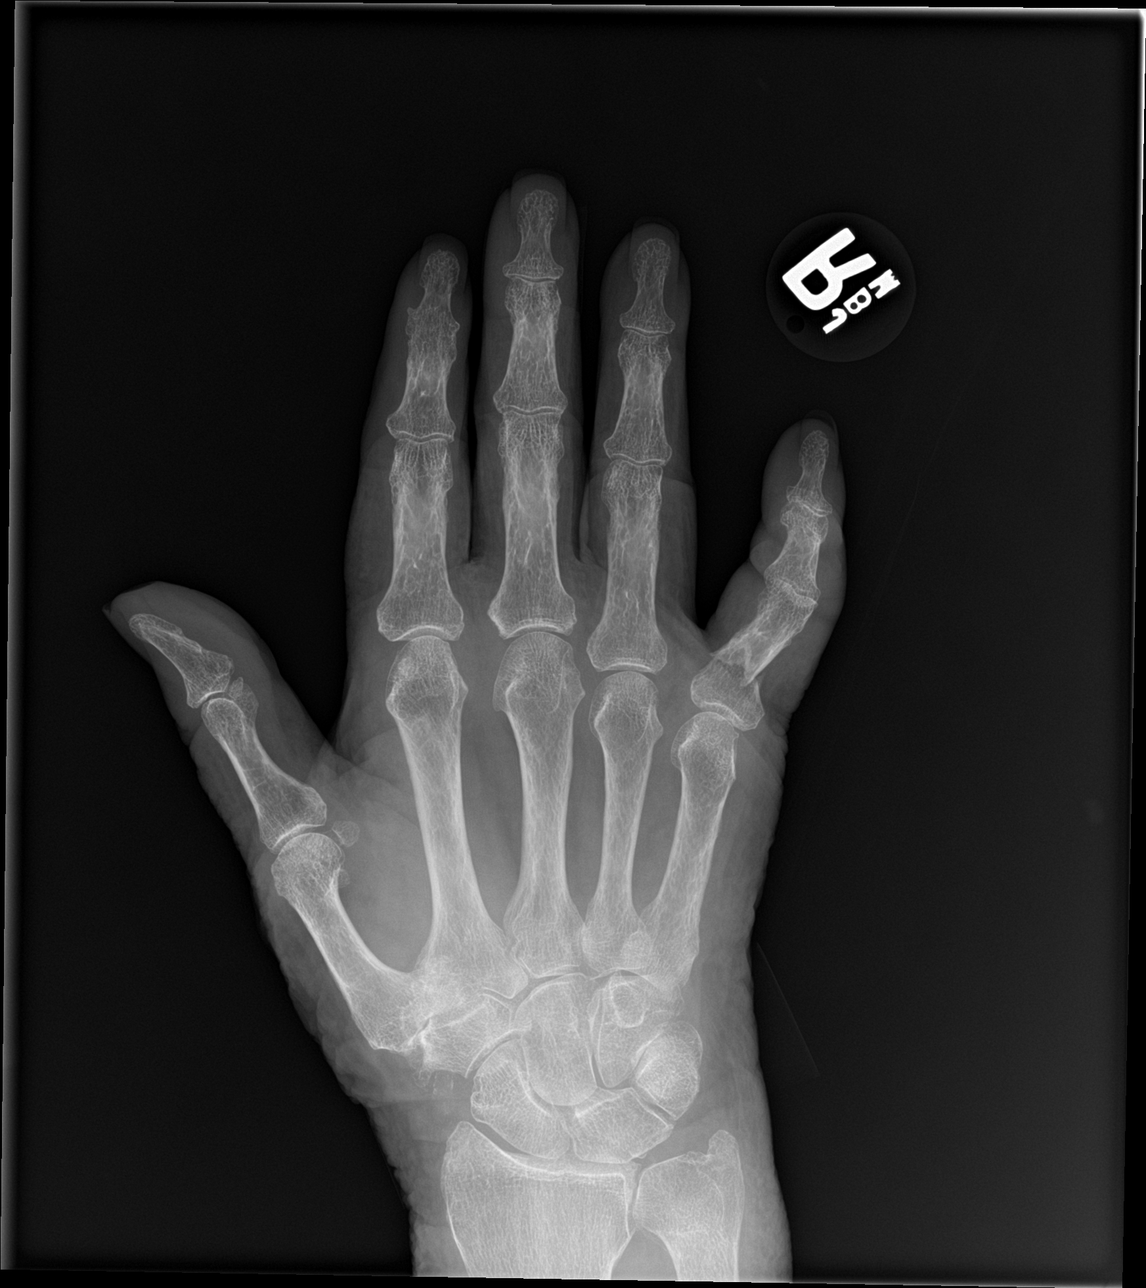

[hand obl]
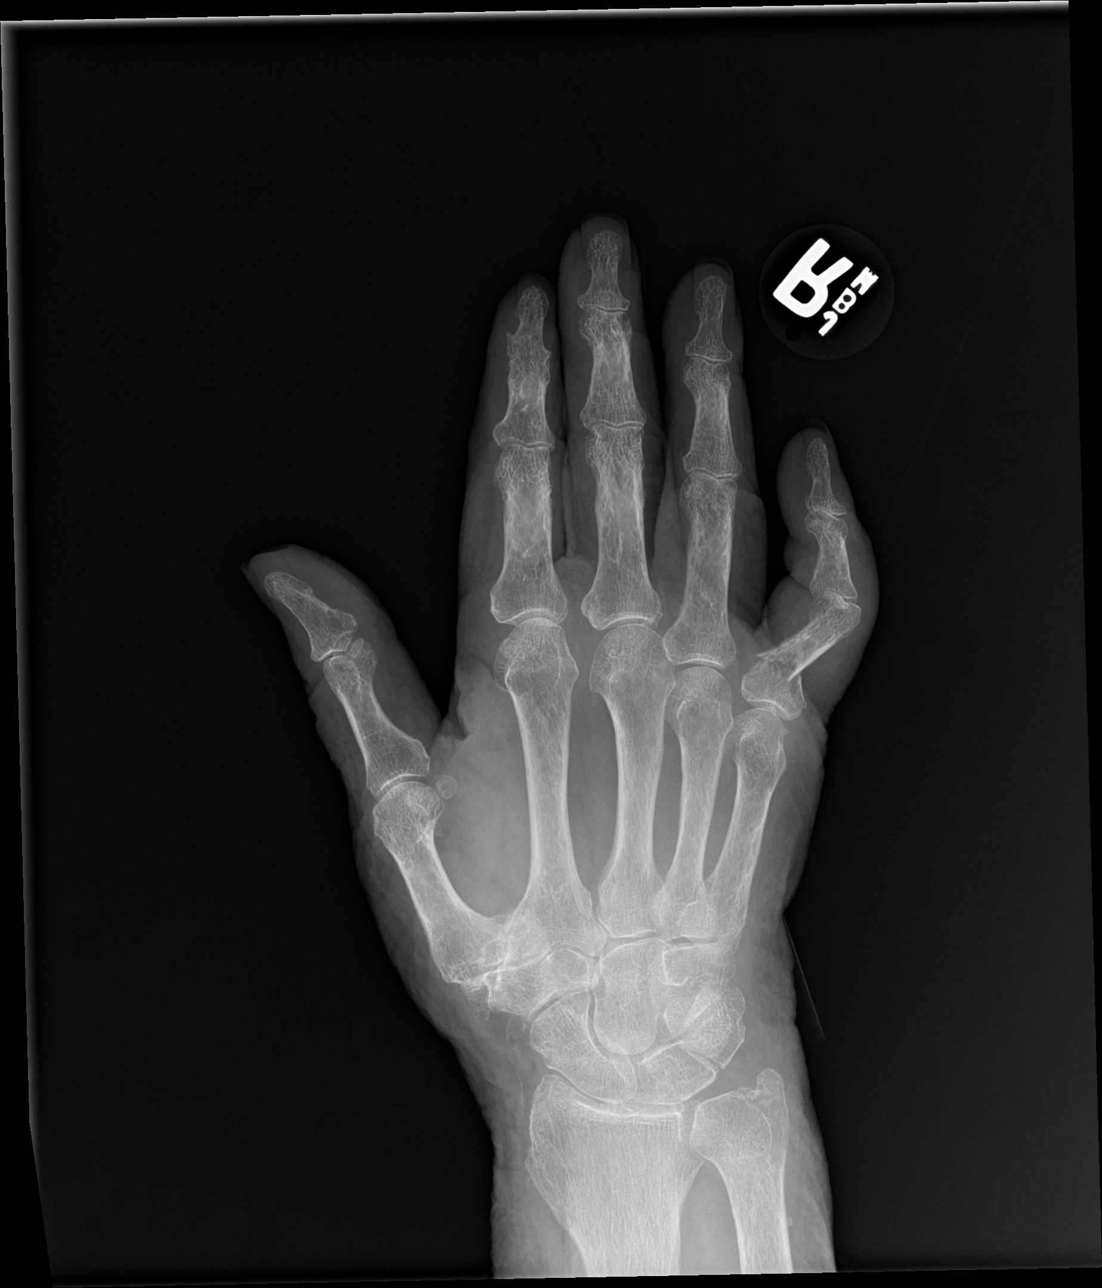

[hand lat]
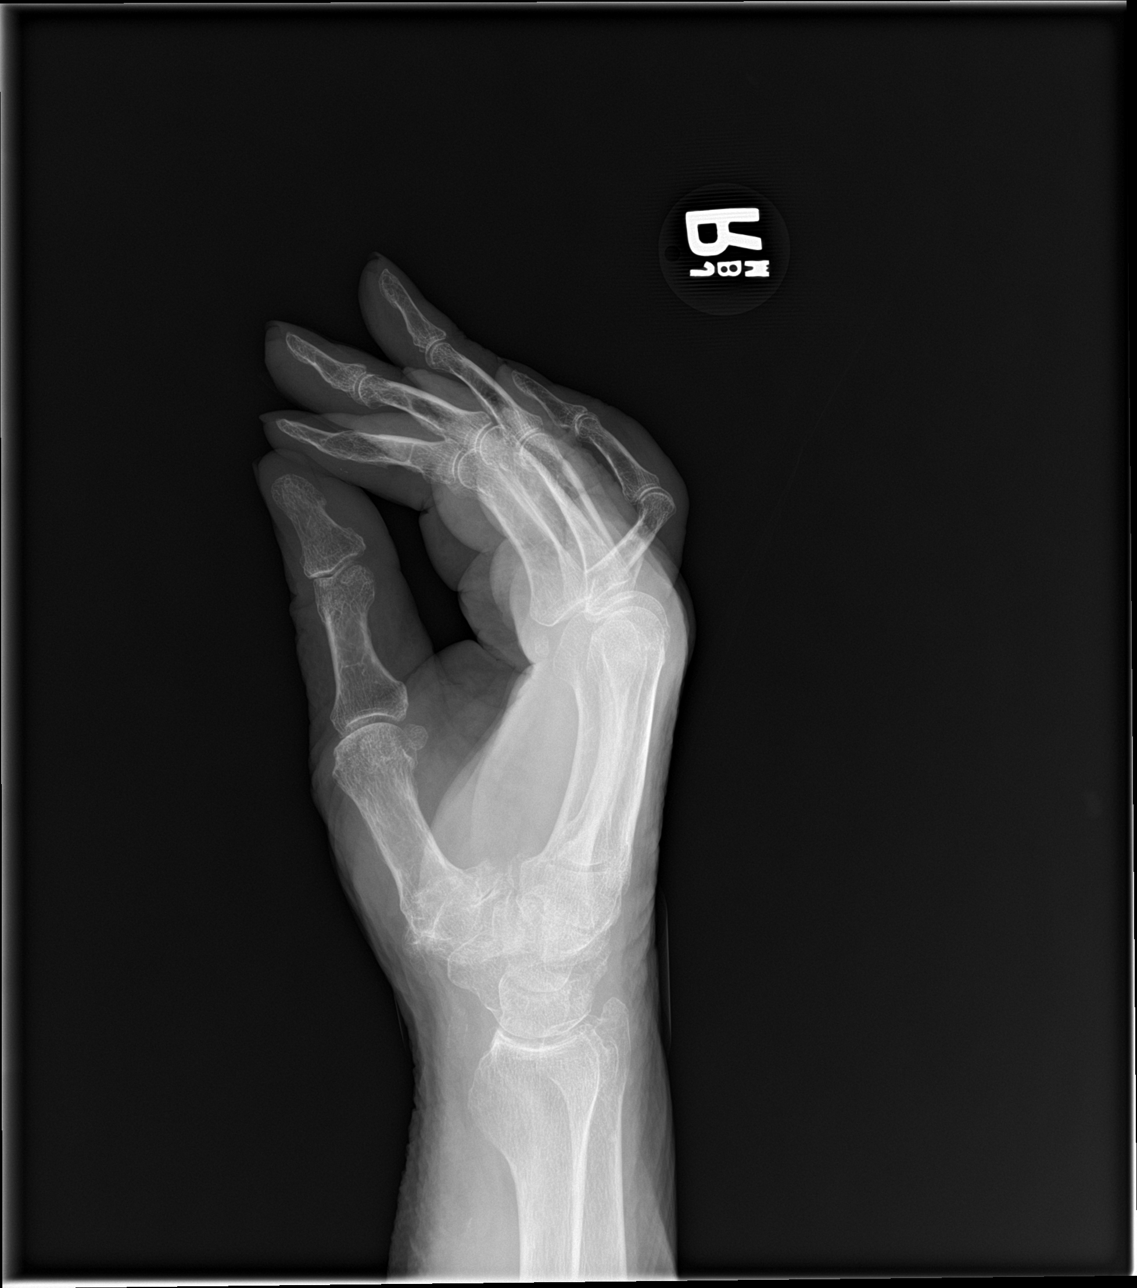

[3 of 3 positions shown; findings below may reference images not displayed]

FINDINGS: Transverse fracture of fifth proximal phalanx proximal diaphysis
with radial and volar angulation and mild displacement. No joint
dislocation. Moderate osteoarthrosis of the basal joint. Second
distal interphalangeal joint effusion.
IMPRESSION: 1. Transverse angulated and mildly displaced fracture of fifth
proximal phalanx proximal diaphysis.
2. Moderate basal joint osteoarthrosis.
3. Second distal interphalangeal joint effusion.

By: Ezeguiel Klatt M.D.
